# Patient Record
Sex: Female | Born: 1940
Health system: Southern US, Community
[De-identification: ages and names within clinical notes are randomized; demographics above are authoritative.]

## PROBLEM LIST (undated history)

## (undated) DIAGNOSIS — R0600 Dyspnea, unspecified: Secondary | ICD-10-CM

## (undated) DIAGNOSIS — J189 Pneumonia, unspecified organism: Secondary | ICD-10-CM

## (undated) DIAGNOSIS — F329 Major depressive disorder, single episode, unspecified: Secondary | ICD-10-CM

## (undated) DIAGNOSIS — I82409 Acute embolism and thrombosis of unspecified deep veins of unspecified lower extremity: Secondary | ICD-10-CM

## (undated) DIAGNOSIS — J449 Chronic obstructive pulmonary disease, unspecified: Secondary | ICD-10-CM

## (undated) DIAGNOSIS — F3289 Other specified depressive episodes: Secondary | ICD-10-CM

## (undated) DIAGNOSIS — J4489 Other specified chronic obstructive pulmonary disease: Secondary | ICD-10-CM

## (undated) DIAGNOSIS — D500 Iron deficiency anemia secondary to blood loss (chronic): Secondary | ICD-10-CM

## (undated) DIAGNOSIS — K219 Gastro-esophageal reflux disease without esophagitis: Secondary | ICD-10-CM

## (undated) DIAGNOSIS — F419 Anxiety disorder, unspecified: Secondary | ICD-10-CM

## (undated) DIAGNOSIS — Z972 Presence of dental prosthetic device (complete) (partial): Secondary | ICD-10-CM

## (undated) DIAGNOSIS — Z923 Personal history of irradiation: Secondary | ICD-10-CM

## (undated) HISTORY — PX: DILATION AND CURETTAGE OF UTERUS: SHX78

## (undated) HISTORY — DX: Other specified chronic obstructive pulmonary disease: J44.89

## (undated) HISTORY — PX: TUBAL LIGATION: SHX77

## (undated) HISTORY — DX: Chronic obstructive pulmonary disease, unspecified: J44.9

## (undated) HISTORY — DX: Major depressive disorder, single episode, unspecified: F32.9

## (undated) HISTORY — DX: Other specified depressive episodes: F32.89

## (undated) HISTORY — DX: Gastro-esophageal reflux disease without esophagitis: K21.9

## (undated) HISTORY — PX: ABDOMINAL HYSTERECTOMY: SHX81

## (undated) HISTORY — PX: CATARACT EXTRACTION W/ INTRAOCULAR LENS  IMPLANT, BILATERAL: SHX1307

## (undated) HISTORY — PX: NASAL SINUS SURGERY: SHX719

## (undated) HISTORY — PX: MULTIPLE TOOTH EXTRACTIONS: SHX2053

## (undated) HISTORY — PX: ABDOMINAL HYSTERECTOMY: SUR658

## (undated) HISTORY — PX: COLONOSCOPY: SHX174

## (undated) HISTORY — PX: CYSTOSCOPY: SUR368

---

## 1997-09-04 ENCOUNTER — Other Ambulatory Visit: Admission: RE | Admit: 1997-09-04 | Discharge: 1997-09-04 | Payer: Self-pay | Admitting: *Deleted

## 1998-04-13 ENCOUNTER — Ambulatory Visit (HOSPITAL_COMMUNITY): Admission: RE | Admit: 1998-04-13 | Discharge: 1998-04-13 | Payer: Self-pay | Admitting: Urology

## 1998-06-18 ENCOUNTER — Other Ambulatory Visit: Admission: RE | Admit: 1998-06-18 | Discharge: 1998-06-18 | Payer: Self-pay | Admitting: Urology

## 2001-03-12 ENCOUNTER — Ambulatory Visit (HOSPITAL_COMMUNITY): Admission: RE | Admit: 2001-03-12 | Discharge: 2001-03-12 | Payer: Self-pay | Admitting: *Deleted

## 2004-02-29 ENCOUNTER — Inpatient Hospital Stay (HOSPITAL_COMMUNITY): Admission: EM | Admit: 2004-02-29 | Discharge: 2004-03-03 | Payer: Self-pay | Admitting: Emergency Medicine

## 2004-03-01 ENCOUNTER — Encounter (INDEPENDENT_AMBULATORY_CARE_PROVIDER_SITE_OTHER): Payer: Self-pay | Admitting: Cardiology

## 2004-03-08 ENCOUNTER — Ambulatory Visit: Admission: RE | Admit: 2004-03-08 | Discharge: 2004-03-08 | Payer: Self-pay

## 2004-07-05 ENCOUNTER — Inpatient Hospital Stay (HOSPITAL_COMMUNITY): Admission: EM | Admit: 2004-07-05 | Discharge: 2004-07-09 | Payer: Self-pay | Admitting: Emergency Medicine

## 2004-07-05 ENCOUNTER — Ambulatory Visit: Payer: Self-pay | Admitting: Internal Medicine

## 2004-07-15 ENCOUNTER — Ambulatory Visit: Payer: Self-pay | Admitting: Internal Medicine

## 2004-08-29 ENCOUNTER — Ambulatory Visit: Payer: Self-pay | Admitting: Internal Medicine

## 2005-02-28 ENCOUNTER — Ambulatory Visit: Payer: Self-pay | Admitting: Internal Medicine

## 2005-03-28 ENCOUNTER — Ambulatory Visit: Payer: Self-pay | Admitting: Pulmonary Disease

## 2005-04-06 ENCOUNTER — Ambulatory Visit: Payer: Self-pay | Admitting: Internal Medicine

## 2005-04-27 ENCOUNTER — Emergency Department (HOSPITAL_COMMUNITY): Admission: EM | Admit: 2005-04-27 | Discharge: 2005-04-28 | Payer: Self-pay | Admitting: Emergency Medicine

## 2005-04-28 ENCOUNTER — Ambulatory Visit: Payer: Self-pay | Admitting: Internal Medicine

## 2005-05-14 ENCOUNTER — Emergency Department (HOSPITAL_COMMUNITY): Admission: EM | Admit: 2005-05-14 | Discharge: 2005-05-15 | Payer: Self-pay | Admitting: Emergency Medicine

## 2005-05-19 ENCOUNTER — Ambulatory Visit: Payer: Self-pay | Admitting: Internal Medicine

## 2005-06-19 ENCOUNTER — Ambulatory Visit: Payer: Self-pay | Admitting: Internal Medicine

## 2005-07-19 ENCOUNTER — Ambulatory Visit: Payer: Self-pay | Admitting: Internal Medicine

## 2005-10-05 ENCOUNTER — Ambulatory Visit: Payer: Self-pay | Admitting: Internal Medicine

## 2006-04-05 ENCOUNTER — Ambulatory Visit: Payer: Self-pay | Admitting: Internal Medicine

## 2006-08-23 ENCOUNTER — Ambulatory Visit: Payer: Self-pay | Admitting: Internal Medicine

## 2006-09-27 ENCOUNTER — Ambulatory Visit: Payer: Self-pay | Admitting: Internal Medicine

## 2007-02-21 ENCOUNTER — Ambulatory Visit: Payer: Self-pay | Admitting: Internal Medicine

## 2007-03-26 ENCOUNTER — Ambulatory Visit: Payer: Self-pay | Admitting: Internal Medicine

## 2007-07-16 ENCOUNTER — Ambulatory Visit (HOSPITAL_COMMUNITY): Admission: RE | Admit: 2007-07-16 | Discharge: 2007-07-16 | Payer: Self-pay | Admitting: *Deleted

## 2007-07-16 ENCOUNTER — Emergency Department (HOSPITAL_COMMUNITY): Admission: EM | Admit: 2007-07-16 | Discharge: 2007-07-16 | Payer: Self-pay | Admitting: Emergency Medicine

## 2007-07-16 DIAGNOSIS — J45909 Unspecified asthma, uncomplicated: Secondary | ICD-10-CM | POA: Insufficient documentation

## 2007-07-16 DIAGNOSIS — K219 Gastro-esophageal reflux disease without esophagitis: Secondary | ICD-10-CM

## 2007-07-16 DIAGNOSIS — J309 Allergic rhinitis, unspecified: Secondary | ICD-10-CM | POA: Insufficient documentation

## 2007-07-16 DIAGNOSIS — J40 Bronchitis, not specified as acute or chronic: Secondary | ICD-10-CM

## 2007-07-17 ENCOUNTER — Ambulatory Visit: Payer: Self-pay | Admitting: Internal Medicine

## 2007-07-17 DIAGNOSIS — J449 Chronic obstructive pulmonary disease, unspecified: Secondary | ICD-10-CM | POA: Insufficient documentation

## 2007-07-18 ENCOUNTER — Inpatient Hospital Stay (HOSPITAL_COMMUNITY): Admission: EM | Admit: 2007-07-18 | Discharge: 2007-07-23 | Payer: Self-pay | Admitting: Emergency Medicine

## 2007-07-18 ENCOUNTER — Ambulatory Visit: Payer: Self-pay | Admitting: Internal Medicine

## 2007-08-08 ENCOUNTER — Ambulatory Visit: Payer: Self-pay | Admitting: Internal Medicine

## 2007-12-16 ENCOUNTER — Ambulatory Visit: Payer: Self-pay | Admitting: Internal Medicine

## 2007-12-16 DIAGNOSIS — M5430 Sciatica, unspecified side: Secondary | ICD-10-CM | POA: Insufficient documentation

## 2008-01-23 ENCOUNTER — Telehealth (INDEPENDENT_AMBULATORY_CARE_PROVIDER_SITE_OTHER): Payer: Self-pay | Admitting: *Deleted

## 2008-02-25 ENCOUNTER — Telehealth (INDEPENDENT_AMBULATORY_CARE_PROVIDER_SITE_OTHER): Payer: Self-pay | Admitting: *Deleted

## 2008-03-17 ENCOUNTER — Ambulatory Visit: Payer: Self-pay | Admitting: Internal Medicine

## 2008-07-09 ENCOUNTER — Telehealth: Payer: Self-pay | Admitting: Internal Medicine

## 2008-09-15 ENCOUNTER — Ambulatory Visit: Payer: Self-pay | Admitting: Internal Medicine

## 2009-03-15 ENCOUNTER — Ambulatory Visit: Payer: Self-pay | Admitting: Internal Medicine

## 2009-09-15 ENCOUNTER — Ambulatory Visit: Payer: Self-pay | Admitting: Internal Medicine

## 2010-06-30 NOTE — Assessment & Plan Note (Signed)
Summary: rov 6 months////kp   Primary Provider/Referring Provider:  Renne Crigler  CC:  6 month follow up visit.  History of Present Illness: 03/17/08- She feels fine now.  We discussed the flu season.  She got chest congestion just before a trip to [facility], and took prednisone.  She felt fine while she was there. Denies cough, phlegm, chest pain or palpitation, fever or chills.  09/15/08- COPD, asthma, allergic rhinitis pollen makes her eyes waterand nose run, but not bad. got through winter well without Flu or serious problems.  Smoking "not much". No routine cough or phlegm, chest pain or swollen nodes. Feels better than she has in a long time.-she credits dieting to lose weight.  March 15, 2009- COPD, asthma, allergic rhinitis C/O 2 weeks "sinus" head congestion, watery nose. Took some left over prednisone with no effect.  Denies fever, sore throat, chest congestion or chest pain. CXR in April - NAD. Routine physical last month "fine". Had flu vax. She credits feeling better to having lost weight.  September 15, 2009- COPD, asthma, allergic rhinitis Spring pollen causing some watery eyes and nose, but chest ok and she got through the winter withur major problem. Having some back pain- asks refill Tylenol#3. We discussed effect of back pain on breathing and also the relative price of Advair products. She uses Xopenex occasionally and Spiriva not at all.  Current Medications (verified): 1)  Simvastatin 20 Mg Tabs (Simvastatin) .... Take 1 By Mouth Once Daily 2)  Xopenex 0.63 Mg/6ml  Nebu (Levalbuterol Hcl) .... 4 Times Daily Prn 3)  Xopenex Hfa Rescue Inhaler .... Prn 4)  Trazodone Hcl 100 Mg  Tabs (Trazodone Hcl) .... Take 1 Tab By Mouth At Bedtime 5)  Spiriva Handihaler 18 Mcg  Caps (Tiotropium Bromide Monohydrate) .... Inhale Contents of 1 Capsule Once A Day 6)  Advair Diskus 500-50 Mcg/dose  Misc (Fluticasone-Salmeterol) .Marland Kitchen.. 1 Puff Two Times A Day, Rinse Mouth Well 7)   Acetaminophen-Codeine #3 300-30 Mg  Tabs (Acetaminophen-Codeine) .Marland Kitchen.. 1-2 Four Times A Day As Needed Pain 8)  Wellbutrin Sr 150 Mg Xr12h-Tab (Bupropion Hcl)  Allergies (verified): 1)  ! Penicillin 2)  ! Sulfa 3)  ! * Pneumococcal Vaccine 4)  ! Biaxin  Past History:  Past Medical History: Last updated: Aug 05, 2007 Allergic Rhinitis Asthma C O P D G E R D cystitis depression  Past Surgical History: Last updated: 08/05/2007 Bronchoscopy- 1988 Hysterectomy Sinus surgery cystoscopy Trigger thumb repair  Family History: Last updated: 08-05-2007 Mother died -MI, diabetic Father died - emphysema Brother -CABG in his late 49's  Social History: Last updated: 03/17/2008 Patient states former smoker.  divorced disabled by copd- had worked at [employer]  Risk Factors: Smoking Status: quit (August 05, 2007)  Review of Systems      See HPI  The patient denies anorexia, fever, weight loss, weight gain, vision loss, decreased hearing, hoarseness, chest pain, syncope, dyspnea on exertion, peripheral edema, prolonged cough, headaches, hemoptysis, and severe indigestion/heartburn.    Vital Signs:  Patient profile:   70 year old female Height:      67 inches Weight:      152.50 pounds BMI:     23.97 O2 Sat:      94 % on Room air Pulse rate:   71 / minute BP sitting:   110 / 68  (left arm) Cuff size:   regular  Vitals Entered By: Reynaldo Minium CMA (September 15, 2009 9:05 AM)  O2 Flow:  Room air  Physical Exam  Additional Exam:  General: A/Ox3; pleasant and cooperative, NAD, note weight loss SKIN: no rash, lesions NODES: no lymphadenopathy HEENT: [place]/AT, EOM- WNL, Conjuctivae- clear, PERRLA, TM-WNL, Nose- clear, Throat- clear and wnl, Mallampati II NECK: Supple w/ fair ROM, JVD- none, normal carotid impulses w/o bruits Thyroid- CHEST: Clear to P&A,  very diminished, slow expiratory phase HEART: RRR, no m/g/r heard ABDOMEN:  ZOX:WRUE, nl pulses, no edema  NEURO: Grossly intact  to observation, squirming attributed to back pain.      Impression & Recommendations:  Problem # 1:  C O P D (ICD-496) Stable through the winter, having avoided major infection. I will let her have a script for mid strength Advair to compare. She tends to drift off Advair when feeling particularly well. We discussed maintenance vs rescue meds.She is very cost sensitive.  Problem # 2:  ALLERGIC RHINITIS (ICD-477.9)  We encouraged use of otc antihistamine if needed.  Medications Added to Medication List This Visit: 1)  Simvastatin 20 Mg Tabs (Simvastatin) .... Take 1 by mouth once daily 2)  Advair Diskus 250-50 Mcg/dose Aepb (Fluticasone-salmeterol) .Marland Kitchen.. 1 puff and rinse mouth, twice daily  Other Orders: Est. Patient Level III (45409)  Patient Instructions: 1)  Please schedule a follow-up appointment in 1 year. 2)  Take the script for Advair 250/50 to your drug store and price-compare with the 500/50 strength. I would rather have you on the weaker one if it works as well. 3)  For allergy, try otc antihistamine: Claritin/ loratadine or zyrtec/ cetirizine. 4)  script for pain med for back pain. Prescriptions: ACETAMINOPHEN-CODEINE #3 300-30 MG  TABS (ACETAMINOPHEN-CODEINE) 1-2 four times a day as needed pain  #50 x 2   Entered and Authorized by:   Waymon Budge MD   Signed by:   Waymon Budge MD on 09/15/2009   Method used:   Print then Give to Patient   RxID:   [id number] ADVAIR DISKUS 250-50 MCG/DOSE AEPB (FLUTICASONE-SALMETEROL) 1 puff and rinse mouth, twice daily  #1 x prn   Entered and Authorized by:   Waymon Budge MD   Signed by:   Waymon Budge MD on 09/15/2009   Method used:   Print then Give to Patient   RxID:   [id number]

## 2010-09-13 ENCOUNTER — Encounter: Payer: Self-pay | Admitting: Internal Medicine

## 2010-09-15 ENCOUNTER — Ambulatory Visit (INDEPENDENT_AMBULATORY_CARE_PROVIDER_SITE_OTHER): Payer: Medicare Other | Admitting: Internal Medicine

## 2010-09-15 ENCOUNTER — Encounter: Payer: Self-pay | Admitting: Internal Medicine

## 2010-09-15 VITALS — BP 116/70 | HR 72 | Ht 67.0 in | Wt 159.8 lb

## 2010-09-15 DIAGNOSIS — J449 Chronic obstructive pulmonary disease, unspecified: Secondary | ICD-10-CM

## 2010-09-15 DIAGNOSIS — M4850XA Collapsed vertebra, not elsewhere classified, site unspecified, initial encounter for fracture: Secondary | ICD-10-CM

## 2010-09-15 DIAGNOSIS — J309 Allergic rhinitis, unspecified: Secondary | ICD-10-CM

## 2010-09-15 MED ORDER — ACETAMINOPHEN-CODEINE #3 300-30 MG PO TABS: 2.0000 | ORAL_TABLET | Freq: Four times a day (QID) | ORAL | Status: AC | PRN

## 2010-09-15 NOTE — Patient Instructions (Signed)
Orders-   Neb neo nasal                 Depo 80   Refill script printed for tylenol III   Call as needed for your breathing meds.

## 2010-09-15 NOTE — Assessment & Plan Note (Signed)
This is mostly upper airway so far, but she expects it to progress if unattended.  We discussed options and will give neb and depo. She will continue sudafed and neti pot

## 2010-09-15 NOTE — Assessment & Plan Note (Addendum)
We reviewed meds and discused refill needs. We will refill tylenol III used for occasional cough control and for back pain.

## 2010-09-15 NOTE — Progress Notes (Signed)
  Subjective:    Patient ID: [name], female    DOB: [birth year], 70 y.o.   MRN: [id number]  HPI 70 yo former smoker followed for COPD and allergic rhinitis, complicated by GERD and chronic back pain.. I last saw her April 68, 2011 with pollen rhinitis then. Notes reviewed. She says she has done well all winter. Now reports 4 days of head congestion, ears and nose stopped up, watery eyes, postnasal drip. Minor wheeze, but chest not bad yet. She blames pollen. Using sudafed and Neti pot without relief.    Review of Systems See HPI Constitutional:   No weight loss, night sweats,  Fevers, chills, fatigue, lassitude. HEENT:   No headaches,  Difficulty swallowing,  Tooth/dental problems,  Sore throat,                CV:  No chest pain,  Orthopnea, PND, swelling in lower extremities, anasarca, dizziness, palpitations  GI  No heartburn, indigestion, abdominal pain, nausea, vomiting, diarrhea, change in bowel habits, loss of appetite  Resp:Some shortness of breath with exertion   No excess mucus, no productive cough, little non-productive cough,  No coughing up of blood.  No change in color of mucus.  Not wheezing.  Skin: no rash or lesions.  GU: no dysuria, change in color of urine, no urgency or frequency.  No flank pain.  MS:  No joint pain or swelling.  No decreased range of motion.  No back pain.  Psych:  No change in mood or affect. No depression or anxiety.  No memory loss.      Objective:   Physical Exam General- Alert, Oriented, Affect-appropriate, Distress- none acute  Skin- rash-none, lesions- none, excoriation- none  Lymphadenopathy- none  Head- atraumatic  Eyes- Gross vision intact, PERRLA, conjunctivae clear   secretions  Ears- Hearing normal  canals, Tm L , R ,  Nose- Clear, No- Septal dev, mucus, polyps, erosion, perforation   Throat- Mallampati II , mucosa clear , drainage- none, tonsils- atrophic  Neck- flexible , trachea midline, no stridor , thyroid nl,  carotid no bruit  Chest - symmetrical excursion , unlabored     Heart/CV- RRR , no murmur , no gallop  , no rub, nl s1 s2                     - JVD- none , edema- none, stasis changes- none, varices- none     Lung- diminished with expiratory wheeze, unlabored;      cough- none , dullness-none, rub- none     Chest wall-   Abd- tender-no, distended-no, bowel sounds-present, HSM- no  Br/ Gen/ Rectal- Not done, not indicated  Extrem- cyanosis- none, clubbing, none, atrophy- none, strength- nl  Neuro- grossly intact to observation         Assessment & Plan:

## 2010-09-18 ENCOUNTER — Encounter: Payer: Self-pay | Admitting: Internal Medicine

## 2010-09-23 ENCOUNTER — Telehealth: Payer: Self-pay | Admitting: Internal Medicine

## 2010-09-23 MED ORDER — LEVOFLOXACIN 500 MG PO TABS: 500.0000 mg | ORAL_TABLET | Freq: Every day | ORAL | Status: AC

## 2010-09-23 NOTE — Telephone Encounter (Signed)
Pt returning call.[name] ° °

## 2010-09-23 NOTE — Telephone Encounter (Signed)
Spoke w/ pt and advised her rx was sent to pharmacy. Pt verbalized understanding and nothing further was needed

## 2010-09-23 NOTE — Telephone Encounter (Signed)
Per CDY-okay to give Levaquin 500mg  #7 take 1 by mouth daily with 1 refill.Vivianne Spence

## 2010-09-23 NOTE — Telephone Encounter (Signed)
LMOMTCB to inform pt levaquin sent to [facility] with 1 refill

## 2010-09-23 NOTE — Telephone Encounter (Signed)
Called, spoke with pt.  She c/o HA, sinus pressure, PND, sneezing, head congestion with bright yellow mucus, and night sweats.  She was just seen on 09/15/10.  Requesting abx - states levaquin usually works well.  Allergies verified.  [facility].  Dr. Maple Hudson, pls advise.  Thanks!  Allergies  Allergen Reactions  . Clarithromycin   . Penicillins   . Pneumococcal Vaccines   . Sulfonamide Derivatives

## 2010-10-11 NOTE — Assessment & Plan Note (Signed)
[facility]                             PULMONARY OFFICE NOTE   NAME:[name], [name]                        MRN:          [id number]  DATE:03/26/2007                            DOB:          03-01-1941    PROBLEMS:  1. Asthma/chronic obstructive pulmonary disease.  2. Allergic rhinitis.  3. Esophageal reflux.   HISTORY:  He had a bronchitis episode earlier this summer, which has  resolved completely.  She feels pretty well now.  She is interested in  trying Chantix again, but was concerned about the risk and safety  information she had heard and we reviewed this and answered her  questions.  She realizes the smoking issue is important enough to her  that I think she is going to try it again.  She had a pneumococcal  vaccine in 2006, and got systemically ill immediately after, so has no  intention of repeating that.  She does want a flu shot.  She complains  of abdominal obesity, crowding her breathing.  We discussed steroid side  effects.  She had a prednisone taper helping clear her bronchitis in  September.   MEDICATIONS:  1. Zoloft 100 mg.  2. Trazodone.  3. Oxygen 2 liters at night and p.r.n.  4. Advair 500/50 mg.  5. Theophylline 200 mg twice daily, mostly being used as once daily      now.  6. Crestor 10 mg.  7. Home nebulizer Xopenex 0.64 mg four times daily p.r.n.  8. Xopenex HFA rescue inhaler p.r.n.   INTOLERANCES/ALLERGIES:  PENICILLIN, SULFA, PNEUMOCOCCAL VACCINE AND  BIAXIN which causes GI upset.   OBJECTIVE:  VITAL SIGNS:  Weight 171 pounds, blood pressure 118/60,  pulse 68, room air saturation 93%.  LUNGS:  Diminished breath sounds, quiet.  ABDOMEN:  Obesity.  HEART:  Sounds regular without murmur.  NECK:  No adenopathy.   IMPRESSION:  Chronic obstructive pulmonary disease.   PLAN:  Weight loss probably would be helpful.  We discussed steroid side  effects as above.  If she can stay stable, we are going to look at  reducing her Advair strength next visit.  Flu vaccine discussed and  given.  Schedule a return in six months or earlier p.r.n.     Clinton D. Maple Hudson, MD, Tonny Bollman, FACP  Electronically Signed    CDY/MedQ  DD: 03/26/2007  DT: 03/27/2007  Job #: [id number]   cc:   Soyla Murphy. Renne Crigler, M.D.

## 2010-10-11 NOTE — Assessment & Plan Note (Signed)
[facility]                             PULMONARY OFFICE NOTE   NAME:[name] [name] YZAGUIRRE                        MRN:          [id number]  DATE:02/21/2007                            DOB:          Feb 23, 1941    PROBLEM:  1. Asthma/chronic obstructive pulmonary disease.  2. Allergic rhinitis.  3. Esophageal reflux.   HISTORY:  Four or five days of malaise with sore throat, moving into  chest congestion, diffuse muscle aches, no definite fever, clear to  yellow sputum.  Spiriva was too expensive.  She is using a netipot.  Still smoking and asks to try Chantix which we have discussed before.   MEDICATIONS:  Zoloft 100 mg, Trazodone, oxygen 2 liters at night and  p.r.n., mostly being used p.r.n., Advair 500/50, theophylline 200 mg  b.i.d., Crestor 10 mg, home nebulizer, Xopenex 0.63, Xopenex HFA Rescue  Inhaler, occasional Tussionex.   DRUG INTOLERANCE:  PENICILLIN, SULFA, BIAXIN (CAUSES GI UPSET),  PNEUMOCOCCAL VACCINE (MAKES HER FEEL BAD).   OBJECTIVE:  Weight 170 pounds.  Blood pressure 144/80, pulse 75, room  air saturation 88% at rest on room air.  She did not bring oxygen and  this was discussed.  Inspiratory and expiratory mild wheezing, no really labored.  HEENT:  Moderate turbinate edema, thick white mucus. Pharynx is a little  reddened, I do not find adenopathy.  HEART:  Regular heart sounds without murmur.  There is no edema.   IMPRESSION:  1. Exacerbation of asthma with COPD.  2. Viral syndrome.   PLAN:  Depo-Medrol 80 mg IM, Prednisone 8-day taper from 40 mg, steroid  talk, Doxycycline x7 days to hold, smoking cessation, Chantix starter  kit.  Keep scheduled return appointment, early p.r.n.     [name] D. Maple Hudson, MD, [name] [name], FACP  Electronically Signed    CDY/MedQ  DD: 02/23/2007  DT: 02/23/2007  Job #: [id number]   cc:   [name] [name]. [name] [name], M.D.

## 2010-10-11 NOTE — H&P (Signed)
[name], [name]                 ACCOUNT NO.:  [id number]   MEDICAL RECORD NO.:  [id number]          PATIENT TYPE:  INP   LOCATION:  6705                         FACILITY:  [facility]   PHYSICIAN:  Nelda Bucks, MD DATE OF BIRTH:  [birth year]   DATE OF ADMISSION:  07/17/2007  DATE OF DISCHARGE:                              HISTORY & PHYSICAL   HISTORY OF PRESENT ILLNESS:  This is a 70 year old white female, known  smoker with known COPD with p.r.n. oxygen never requiring intubation for  COPD in the past who presents with three day history of increasing  cough, pleuritic chest pain, diffuse in nature and shortness of breath.  The patient reported increasing shortness of breath at rest as well as  with exertion and presents to the emergency room on February 17, treated  with nebulized therapy and diagnosis was COPD exacerbation at that time  and improved clinically.  She also saw her primary pulmonologist, Dr.  Fannie Knee, in the office this morning, and Dr. Maple Hudson had concerns  about possibility of her requiring hospitalization, but prednisone  therapy was prescribed to see if this would improve her shortness of  breath.  She now presents to the emergency room with increasing  shortness of breath, worsening coughing, but denies fevers.  She did say  she had been productive of a sputum that has been clear in nature, but  the amount of sputum has much increased, but this color has not changed.  She denies nausea, vomiting, diarrhea or abdominal pain.  She supposedly  has been about the require an EGD from her GI specialist yesterday, but  that EGD was held secondary to her respiratory status.  It appeared she  was supposed to be having this EGD, according to the patient, because of  bloating, to get ruled out for stomach ulcers.  In the emergency room,  she is found to be diffusely wheezing and requires hospitalization.   ALLERGIES:  PENICILLIN AND SULFA, SUPPOSEDLY CAUSE ANGIOEDEMA.   ALLERGIC  TO ROFECOXIB AND CELEBREX WHICH CAUSE SIDE EFFECT OF CHEST PAIN.  SIDE  EFFECT OF WEIGHT GAIN TO CELEXA AND MIRTAZAPINE.   PAST MEDICAL HISTORY:  1. Asthma/COPD.  2. Afebrile.  3. GERD.  4. Pneumovax in 2006.  5. Anxiety disorder.  6. Depression.  7. Negative cardiac catheterization by Dr. Katrinka Blazing, according to the      patient, in the past.   MEDICATIONS:  1. Zoloft.  2. Spiriva.  3. Theophylline.  She has discontinued.  She denies taking the last      couple of weeks.  4. Crestor.  5. Xopenex, just started by Dr. Fannie Knee.  6. Hydrocodone/acetaminophen.  7. Promethazine.  8. Advair Discus.   SOCIAL HISTORY:  She is a known smoker for years.  Denied alcohol,  denied drugs.  She has three children.  She is divorced for 25 years.  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   FAMILY HISTORY:  Positive for COPD and positive for thyroid disease.   REVIEW OF SYSTEMS:  As above mentioned  in the GI section of her HPI.   PHYSICAL EXAMINATION:  VITAL SIGNS:  Temperature 97, 86% saturation on  room air upon arrival, now on 3 liters sating 98%, respiratory rate 24,  heart rate 112, blood pressure 141/82.  GENERAL:  The patient is in mild respiratory distress, leaning forward,  coughing.  HEENT:  No jugular venous distention.  No masses felt.  No  sternocleidomastoid lymphadenopathy section.  No thrush.  No stridor.  She did have some radiated wheezing up to the airway.  LUNGS:  Expiratory wheezing, moderate in nature.  Diffuse location  negative for egophony.  Negative for rhonchi.  Negative for crackles.  HEART:  S1, S2 regular rate and mildly tachycardic at 102 to 110.  No  murmurs, rubs or gallops but difficult to auscultate secondary to  wheezing.  ABDOMEN:  Belly soft, bowel sounds present.  There is no masses felt in  the epigastrium.  There is no rebound and no guarding.  EXTREMITIES:  No edema.  No cyanosis or clubbing.  CNS:  Alert  and oriented x3.  Nonfocal.   LABORATORY DATA:  Labs in the ER shows a portable chest x-ray with  hyperinflation with some flattening of the diaphragms, mild chronic  interstitial changes.  No significant changes noted from yesterday's  portable chest x-ray.  There is no other laboratory data that I have  here in the emergency room.   ASSESSMENT/PLAN:  1. COPD exacerbation.  2. Rule out acute bronchitis.  3. GERD with new symptoms of bloating, being worked up by her GI      specialists.  4. Anxiety.  5. Sciatica pain in the left side which is old and she has had in the      past.   PLAN:  The patient will be admitted to the hospital.  IV Solu-Medrol 80  mg q.8 h will be administered.  Frequent nebulization with albuterol and  Atrovent.  Follow her heart rate response.  She may require Xopenex.  I  will make a consideration for antibiotic in this patient given the fact  that she has had recurrent worsening symptoms over three days regardless  of administration of steroids, and she has an increase in her sputum  production.  In general, antibiotic therapy potentially has a benefit  regardless of infection by reducing inflammatory mediators, and we will  consider a five day course of Avelox.  Will check sputum is she is  productive.  She also will use PPI to reduce GERD symptoms.  She will  get a chemistry, CBC differential, EKG at this time.  Add Robitussin  with codeine to control her cough.  She will be continued on her  baseline home medications including trazodone and Zoloft and her Advair  Discus twice a day.  Because coughing causing insomnia, we will try to  control her cough.  She is a smoker but denies smoking more than 10  cigarettes in a day.  Therefore, we will hold off on nicotine patch at  this time and  will provide Percocet for her sciatica pain.  Will provide oxygen  therapy to saturation 90-92%.  She will be admitted to Dr. Harriette Ohara  service.  I also will  provide insulin therapy to control probable  hyperglycemia related to steroids which she has had in the past  according to notes.      Nelda Bucks, MD  Electronically Signed     DJF/MEDQ  D:  07/18/2007  T:  07/18/2007  Job:  [id number]   cc:   Joni Fears D. Maple Hudson, MD, FCCP, FACP

## 2010-10-14 NOTE — Assessment & Plan Note (Signed)
[facility]                               PULMONARY OFFICE NOTE   NAME:[name], [name]                        MRN:          [id number]  DATE:04/05/2006                            DOB:          [birth year]    PROBLEM:  1. Asthma/chronic obstructive pulmonary disease.  2. Allergic rhinitis.  3. Esophageal reflux.   HISTORY:  She did well on recent travel to [place] and feels okay today.  Continuing Advair at 500/50.  She has put on a little weight and we  discussed the dose of her steroid inhaler, choosing to wait through the  winter because of the severity of exacerbation she has had in the past.  We  discussed pneumococcal vaccine which she had in 2006 for the first time.  She blames it for making her acutely ill at that time but wants flu shot.  She is not currently coughing or bringing up any phlegm but she expects to  wheeze as a routine most days.   MEDICATIONS:  1. Zoloft 100 mg.  2. Trazodone.  3. Oxygen at 2 liters used at night and p.r.n.  4. She dropped off Spiriva.  5. Advair 500/50 once b.i.d.  6. Theophylline 200 mg b.i.d.  7. Crestor 10 mg.  8. Home nebulizer is Xopenex 0.63.  Rescue albuterol is used occasionally.   ALLERGIES:  Drug intolerance to PENICILLIN, SULFA, BIAXIN with GI upset,  PNEUMOCOCCAL VACCINE.   OBJECTIVE:  Weight 162 pounds, blood pressure 118/80, pulse regular 74, room  air saturation 91%.  There is bilateral expiratory wheeze only with forced  expiration.  With quiet breathing she sounds fairly clear, but expiratory  phase is prolonged.  There is no neck vein distention, adenopathy, cyanosis  or peripheral  edema.  Heart sounds are regular without murmur.   IMPRESSION:  Stable COPD with a chronic asthmatic/bronchitis component.  Pulmonary function testing 2 years ago had shown an FEV1 60% of predicted  after a significant response to bronchodilator with a diffusion capacity of  74%.   PLAN:  Flu vaccine,  chest x-ray.  Schedule return 6 months and consider then  reducing Advair to 250/50.  I am marking her chart that I would like to get  a PFT on followup.     Clinton D. Maple Hudson, MD, Tonny Bollman, FACP  Electronically Signed    CDY/MedQ  DD: 04/08/2006  DT: 04/08/2006  Job #: [id number]   cc:   Soyla Murphy. Renne Crigler, M.D.  Lyn Records, M.D.

## 2010-10-14 NOTE — Discharge Summary (Signed)
[name], [name]                 ACCOUNT NO.:  [id number]   MEDICAL RECORD NO.:  [id number]          PATIENT TYPE:  INP   LOCATION:  6706                         FACILITY:  [facility]   PHYSICIAN:  Clinton D. Maple Hudson, MD, FCCP, FACPDATE OF BIRTH:  1940/11/09   DATE OF ADMISSION:  07/17/2007  DATE OF DISCHARGE:  07/23/2007                               DISCHARGE SUMMARY   DISCHARGE DIAGNOSES:  1. Acute exacerbation of chronic obstructive pulmonary disease.  2. Anxiety.  3. Gastroesophageal reflux disorder.  4. Sciatica.   BRIEF HISTORY:  This is a 70 year old former smoker with advanced COPD  using p.r.n. oxygen at home who presented with increased cough and  dyspnea to the emergency room on February 17.  She saw me then in the  office the next morning for continuation of accelerated outpatient  therapy including steroids and bronchodilators.  She returned to the  emergency room on February 18 still short of breath and coughing without  fever and with clear sputum.  At that point she was admitted for  stabilization of COPD exacerbation.  Examination on admission showed an  initial room air oxygen saturation of 86% improving to 98% on 3 liters  with heart rate 112 and expiratory wheezes.  Chest x-ray showed  hyperinflation and chronic interstitial changes.  She had additional  history of esophageal reflux with abdominal bloating, anxiety, and  chronic back pain.  She was admitted to medical floor.   HOSPITAL COURSE:  She is treated with supplemental oxygen, nebulized  bronchodilators, intravenous steroids, and antibiotics.  She slowly  improved.  Xanax reduced anxiety which was partly related to her  medications.  She was substantially improved by the day of discharge  without wheeze and feeling more comfortable.  Blood pressure elevation  was noted and attributed to medications for outpatient follow up.  She  was discharged with condition improved.   LABORATORY:  EKG showed  nonspecific ST changes.  Chest x-ray showed  hyperaeration and bronchitis changes likely chronic with no masses or  pneumonia.  Admission WBC 14,000 fell to 12,500 with hemoglobin of 15.2.  Coagulations were normal.  Glucoses were elevated on admission,  recognizing she had already begun steroids as an outpatient.  This fell  during hospitalization.  Liver enzymes were normal.   DISCHARGE PLANS:  She was discharged home she will with her p.r.n.  oxygen, regular diet, progress activity as tolerated.   MEDICATIONS:  Trazodone 100 mg at bedtime, Advair 500/50 one puff rinse  b.i.d., Crestor 10 mg daily, nebulized Xopenex 0.63 q.i.d. p.r.n.  Spiriva once daily, prednisone tapering with 20 mg tabs from 40 mg daily  to stop after 8 days.  Office followup in 3 weeks.     Clinton D. Maple Hudson, MD, Tonny Bollman, FACP  Electronically Signed    CDY/MEDQ  D:  09/04/2007  T:  09/04/2007  Job:  [id number]

## 2010-10-14 NOTE — H&P (Signed)
NAMEFRANCIS, [name]                 ACCOUNT NO.:  [id number]   MEDICAL RECORD NO.:  [id number]          PATIENT TYPE:  INP   LOCATION:  6712                         FACILITY:  [facility]   PHYSICIAN:  Gertha Calkin, M.D.DATE OF BIRTH:  10-11-1940   DATE OF ADMISSION:  07/04/2004  DATE OF DISCHARGE:                                HISTORY & PHYSICAL   PRIMARY CARE PHYSICIAN:  Soyla Murphy. Renne Crigler, M.D.   HISTORY OF THE PRESENT ILLNESS:  This is a 70 year old white female with  COPD and anxiety/depression who presents with a 10-14-day history of  difficulty breathing, waxing and waning, and subjective fevers, productive  sputum with streaks of bleed initially, but no clear.  No sick contacts.  No  recent tobacco abuse.   PAST MEDICAL HISTORY:  The past medical history is significant for:  1.  COPD.  2.  Anxiety and depression.   MEDICATIONS:  1.  Advair 250/50 Diskus 1 puff p.o. b.i.d.  2.  Trazodone 150 mg p.o. q.h.s.  3.  Zoloft 100 mg p.o. daily.   ALLERGIES:  PENICILLIN and SULFA causes her to have angioedema.   FAMILY HISTORY:  No significant history of coronary artery disease, strokes  or cancers   REVIEW OF SYSTEMS:  No headaches, blurred vision, problems with her  appetite, weight loss or weight gain, chest pain, or chest tightness.  No  problems with constipation or diarrhea.  No hematemesis, hemoptysis,  hematochezia or melanotic stools. She has no urinary symptoms. The rest of  the review of systems is negative.   PHYSICAL EXAMINATION:  VITAL SIGNS:  Temperature is 100.2, blood pressure  on admission to the ED was 161/99 and spontaneously went down to 118/68  after a breathing treatment, pulse is 97, respirations 22, and sat 93% on 2  liters and 96% on 4 liters.  GENERAL APPEARANCE:  In general she appears in no acute distress, lying in  bed.  HEENT:  The head, eyes, ears, nose and throat are unremarkable.  HEART:  Cardiovascular is regular rate and rhythm.  No murmurs,  rubs or  gallops.  LUNGS:  The lungs have diffuse wheezes.  Tight air sounds, but adequate  movement.  No accessory muscle use.  She is able to hold a conversation  without tiring out.  ABDOMEN:  The abdomen is soft, nontender and nondistended.  Bowel sounds are  present.  EXTREMITIES:  The extremities are without clubbing, cyanosis or edema.  SKIN:  The skin is without rashes or lesions.  NEUROLOGIC:  Cranial nerves II-XII are intact.  No focal deficits.  Strength  is intact and symmetric in the upper and lower extremities.  VASCULAR:  Pulses are 2+ in the upper and lower extremities bilaterally.   LABORATORY DATA:  Chest x-ray was only significant for emphysematous-  appearing lungs.  No acute infiltrates.  Her white count is 13.1, hemoglobin  12.9, hematocrit 37.3, MCV 89 and platelets 405,000.  She has a left shift  with absolute neutrophils of 10.3.  Sodium of 141, potassium 3.2, chloride  of 106, bicarb of 28, glucose of 101,  BUN of 16, creatinine of 1.1, and  calcium of 8.9.  Total protein 6.4, albumin 3.3, AST of 18, ALT of 15, alk  phos of 87, and total bili of 0.6.   ASSESSMENT AND PLAN:  1.  Upper respiratory infection.  2.  Chronic obstructive pulmonary disease exacerbation.  3.  Anxiety/depression.  4.  Deep venous thrombosis and gastrointestinal prophylaxis.   Plan:  1.  We will admit and lace her on IV steroids q.8.  2.  Scheduled breathing treatments as well as rescue p.r.n. treatments      available.  3.  Start her on azithromycin for upper respiratory infection.  4.  Place her on O2 to wean in approximately 48 hours as her lung functions      improve.  5.  We will also check sputum cultures, repeat her potassium and place her      on Pepcid and PAS hose for GI/DVT prophylaxes.  6.  Otherwise we will continue her home medications and follow up in the      morning.      JD/MEDQ  D:  07/05/2004  T:  07/05/2004  Job:  [id number]   cc:   Soyla Murphy. Renne Crigler, M.D.   [address]  [place]  [place] [address]  Fax: [phone]

## 2010-10-14 NOTE — Consult Note (Signed)
[name], [name]                 ACCOUNT NO.:  [id number]   MEDICAL RECORD NO.:  [id number]          PATIENT TYPE:  INP   LOCATION:  6712                         FACILITY:  [facility]   PHYSICIAN:  Clinton D. Maple Hudson, M.D. DATE OF BIRTH:  19-Oct-1940   DATE OF CONSULTATION:  07/08/2004  DATE OF DISCHARGE:                                   CONSULTATION   REASON FOR CONSULTATION:  A 70 year old white female former smoker with  persistent wheezing and dyspnea.   HISTORY OF PRESENT ILLNESS:  I was asked to see this woman with history of  COPD, anxiety, and depression who presented with one to two weeks of  increased exertional and resting dyspnea, chest congestion, and concern  about streak hemoptysis which had resolved. She was admitted with assessment  that she was having exacerbation of COPD and she has been treated with  intravenous steroids, nebulized bronchodilators, Zithromax, and anxiolytics.  Hyperglycemia was noted on steroids and covered by sliding scale. She is  concerned that she has had only partial return to the level of pulmonary  comfort that she remembers from last summer before all of this began.   Hospital workup has included chest CT and ventilation perfusion lung scan  which has shown marked emphysema without acute cardiopulmonary disease and  without evidence of pulmonary embolus. Incidental note of breast implants.  CBC and chemistry panel are unremarkable after initial mild hypokalemia.  Glucose had risen to 139.   REVIEW OF SYMPTOMS:  Complains of abdominal wall muscle pain from hard  coughing. Describes coughing up pieces like liver and a few blood streaks.  There was a suggestion that she was describing blood clot coughed up.  Otherwise, sputum was yellow and is now white. Mild nasal congestion. Is not  having chills or fever. Denies palpitation, pleuritic or exertional chest  pain. No ankle edema.   PAST MEDICAL HISTORY:  1.  Chronic obstructive pulmonary  disease with previous hospitalization in      2005 which seems to have been for a similar episode.  2.  Prior heart catheterization by Dr. Lyn Records, which she understands      to be normal.   ALLERGIES:  PENICILLIN and SULFA with angioedema.   FAMILY HISTORY:  Father had COPD after working for a stone cutting company  and a Medical laboratory scientific officer.   SOCIAL HISTORY:  Quit smoking years ago. She had worked in a Public librarian,  now retired.   PHYSICAL EXAMINATION:  VITAL SIGNS:  Blood pressure 140/73, pulse regular  87, respirations about 22, temperature 98.8. O2 saturation on two liters  recorded at 92%.  GENERAL:  She is a well-developed, well-nourished, very talkative woman.  Alert and oriented. Sitting up on the edge of the bed wearing nasal oxygen  at two liters.  SKIN:  No rash.  ADENOPATHY:  None found at the neck, shoulder or axillae.  HEENT:  Clear speech. Nasal airway is clear.  NECK:  No JVD. No stridor.  CHEST:  Heart sounds regular without murmur or gallop. Bilateral mild  inspiratory and expiratory wheeze, mainly  end-expiration and unlabored. No  dullness, rub or rales.  ABDOMEN:  Scaphoid and nontender without palpable organomegaly.  EXTREMITIES:  No clubbing, cyanosis, or edema.   LABORATORY DATA:  CT and ventilation perfusion lung scans were negative for  pulmonary embolus or acute process which showed evidence of considerable  emphysema with some air trapping. EKG showed normal sinus rhythm with poor  anterior R-wave progression raising question of prior anterior infarct.   IMPRESSION:  1.  Baseline chronic obstructive pulmonary disease, severity unclear: She      reports having had a pulmonary function test at [facility]      within the last year or so but does not understand the results.  2.  Acute exacerbation, almost certainly a viral episode. I doubt that she      reacted systemically to the pneumococcal vaccine. Slow response to      medications can be  quite typical of some viral events but she is      probably getting back to a level that she could function at home.  3.  There is at least a suggestion that she may be hypoxic on room air for a      considerable period of time. Therefore, I will go ahead and ask for an      oxygen saturation on room air so that we can order home oxygen if      appropriate.  4.  Consider trial of Xopenex by home nebulizer favoring low dose 0.63 as      less likely to cause stimulation, only if albuterol is too stimulating.      She had Spiriva before but was afraid to try it. So, I would let her try      again with available supply.  5.  Emphasis should be on continued education and reassurance:  She may need      an anxiolytic. At her request, I have indicated that I would be willing      to follow her for her lung disease in the office at [facility]      if appropriate, after discharge. She will follow with her primary care      physician, Dr. Soyla Murphy. Pharr, for general care.      CDY/MEDQ  D:  07/08/2004  T:  07/08/2004  Job:  [id number]   cc:   Mobolaji B. Corky Downs, M.D.

## 2010-10-14 NOTE — H&P (Signed)
NAMEEVIN, CHIRCO                 ACCOUNT NO.:  [id number]   MEDICAL RECORD NO.:  [id number]          PATIENT TYPE:  EMS   LOCATION:  MAJO                         FACILITY:  [facility]   PHYSICIAN:  Isidor Holts, M.D.  DATE OF BIRTH:  1941/02/09   DATE OF ADMISSION:  02/29/2004  DATE OF DISCHARGE:                                HISTORY & PHYSICAL   PRIMARY CARE PHYSICIAN:  Soyla Murphy. Pharr, M.D.   CHIEF COMPLAINT:  Chest pain/discomfort and shortness of breath for  approximately one month.   HISTORY OF PRESENT ILLNESS:  As above, the patient states that over the past  12 months she has had gradually increasing shortness of breath with chest  pressure.  Denies lower extremity swelling.  Denies cough or fever.  Has no  history of paroxysmal nocturnal dyspnea.  Last night, i.e., February 28, 2004,  the shortness of breath became much worse.  Denies paroxysmal nocturnal  dyspnea at this time.  This morning; i.e., February 29, 2004, called her  primary medical doctor, was seen in his office at 11 a.m. and directed to  the emergency room. Denies recent travel or immobilization.   PAST MEDICAL HISTORY:  1.  Depression.  2.  Status post negative bronchoscopy July 1988 (? indication).  3.  Negative EDG September 2001.   MEDICATIONS:  1.  Trazodone 150 mg p.o. q.h.s.  2.  Was on Zoloft 200 mg p.o. daily; however, this was discontinued      approximately one month ago.   ALLERGIES:  1.  SULFA caused angioedema.  2.  PENICILLIN causes angioedema.  3.  REMERON causes weight gain.  4.  CELEXA causes weight gain.  5.  TORADOL causes tingling.  6.  CELEBREX and VIOXX caused chest tightness.   SOCIAL HISTORY:  The patient is retired February 2003.  Nonsmoker,  nondrinker.  No history of drug abuse.   FAMILY HISTORY:  Mother died of MI at age 68 years.  She also had diabetes  mellitus.  Father died of emphysema at age 80 years.  The patient had two  sisters, both now deceased, one of diabetes  mellitus and its complications,  the other of lung cancer.  She one brother who has heart problems.  She also  has three sons, all of whom are alive and well.   REVIEW OF SYSTEMS:  Systems reviewed as per HPI and Chief Complaint,  otherwise negative.   PHYSICAL EXAMINATION:  VITAL SIGNS:  Temperature 97.6, pulse 82 per minute  and regular, respirations 20, blood pressure initially 145/87 mmHg;  rechecked 123/68 mmHg.  Oxygen saturation on room air 96%.  GENERAL:  The patient looks quite comfortable, nothing of chest pain after  intravenous infusion of NTG started in the emergency department.  Not short  of breath at rest.  Communicative.  HEENT:  No frank pallor, no jaundice, no conjunctival injection.  NECK:  Supple.  JVP not seen.  No palpable lymphadenopathy, no palpable  goiter, no carotid bruits.  CHEST:  Clear to auscultation.  No wheezes or crackles.  CARDIAC:  Heart sounds 1  and 2 heard.  Normal rate, no murmurs.  ABDOMEN:  Full, soft, and nontender.  There is no palpable organomegaly, no  palpable masses.  Normal bowel sounds.  EXTREMITIES:  The patient has multiple fine varicosities; however, there is  no tenderness, tenseness, erythema to suggest DVT.  MUSCULOSKELETAL:  Full range of motion.  No __________, tenderness, or  deformity noted.  CENTRAL NERVOUS SYSTEM:  Alert and oriented x 3.  No focal neurologic  deficit on gross examination.   INVESTIGATIONS:  CBC:  WBC 6.5, hemoglobin 14.6, hematocrit 41.5, platelets  343.  Electrolytes: Sodium 139, potassium 3.8, chloride 107, CO2 26, BUN 17,  creatinine 0.8, glucose 98.  Troponin I less than 0.05.  LFTs within normal  limits.   EKG shows sinus rhythm, regular, 60 per minute.  Normal axis, no acute  ischemic changes.   IMPRESSION AND PLAN:  1.  History of increasing shortness of breath with chest discomfort over one      month, now worse.  Differential includes pulmonary embolism,      cardiomyopathy, coronary artery  disease.  Will admit patient to      telemetry unit for monitoring.  Will do spiral chest CT scan, chest x-      ray, and D-dimer.  Also need to do echocardiogram.  We will rule out MI      with cardiac enzymes x 3 and EKG x 3.  However, meanwhile, I will offer      patient an aspirin.  Will continue intravenous infusion of      nitroglycerin, subcutaneous Lovenox for DVT prophylaxis.  However, if CT      angiogram is positive, will give anticoagulation. The patient is to have      bed rest.  Have called cardiology consult who will kindly be provided by      Dr. Aleen Campi.  2.  History of depression.  It is possible that fact may be contributing to      symptoms.  Will start patient on Xanax twice a day.  Also will commence      patient on proton pump inhibitor in case gastroesophageal reflux disease      may be a factor.  Further management will depend on clinical course.       CO/MEDQ  D:  02/29/2004  T:  02/29/2004  Job:  [id number]   cc:   Soyla Murphy. Renne Crigler, M.D.  [address]  [place]  [place] [address]  Fax: [phone]

## 2010-10-14 NOTE — Assessment & Plan Note (Signed)
[facility]                             PULMONARY OFFICE NOTE   NAME:[name], [name]                        MRN:          [id number]  DATE:08/23/2006                            DOB:          [birth year]    PROBLEMS:  1. Asthma/chronic obstructive pulmonary disease.  2. Allergic rhinitis.  3. Esophageal reflux.   HISTORY:  Blames pollen for increased wheeze, cough and shortness of  breath, more chest congestion than head congestion in the past week.  Not much sneezing, no real sore throat or fever, nothing purulent.  Cough is productive of white phlegm.  She asks refill Tussionex.  Is not  smoking.  Has been on disability now for two years and is anticipating  going on Medicare.  Her theory is that abdominal obesity is crowding her  lungs and causing her to wheeze.   MEDICATIONS:  1. Zoloft 100 mg.  2. Trazodone.  3. Home oxygen 2 liters used at night.  4. Advair 500/50.  5. Theophylline 200 mg b.i.d.  6. Crestor 10 mg.  7. Nebulizer Xopenex 0.63 and Xopenex HFA inhalers are used p.r.n. at      home.   DRUG INTOLERANCES:  PENICILLIN, SULFA, BIAXIN with GI upset and  PNEUMOCOCCAL VACCINE.   OBJECTIVE:  VITAL SIGNS:  Weight 165 pounds, blood pressure 130/86,  pulse regular 78, room air saturation at rest is 91%.  HEENT:  There is turbinate edema but her throat is clear with no post  nasal drip, no adenopathy.  Congested, wheezy cough with musical wheeze,  inspiratory and expiratory.  No accessory muscle use.  No focal  dullness.  I do not find adenopathy.  CARDIOVASCULAR:  Heart sounds are regular, best heard near the xiphoid.  EXTREMITIES:  She does not have clubbing, cyanosis, or edema.   IMPRESSION:  Exacerbation of asthma with chronic obstructive pulmonary  disease.  This could be viral or allergic at this point.   PLAN:  1. We discussed her weight concerns, steroid therapy and exercise      tolerance.  2. Prednisone 8 day taper from 40  mg.  3. Continue present medications as at home.  4. Spirometry before and after bronchodilator.  5. Refilled Tussionex 5 mL q.12h. p.r.n. occasionally used for severe      cough.  6. Schedule return in three weeks, early p.r.n.     Clinton D. Maple Hudson, MD, Tonny Bollman, FACP  Electronically Signed    CDY/MedQ  DD: 08/23/2006  DT: 08/23/2006  Job #: [id number]   cc:   Soyla Murphy. Renne Crigler, M.D.  Lyn Records, M.D.

## 2010-10-14 NOTE — Assessment & Plan Note (Signed)
[facility]                             PULMONARY OFFICE NOTE   NAME:[name], [name]                        MRN:          [id number]  DATE:09/27/2006                            DOB:          July 05, 1940    PROBLEM:  1. Asthma/chronic obstructive pulmonary disease.  2. Allergic rhinitis.  3. Esophageal reflux.   HISTORY:  Pollen causing a little nasal congestion, otherwise no change  or complaints.  She admits she wheezes a little bit most of the time and  has a daily productive cough bringing up only white sputum, never  anything bloody or purulent.  She only occasionally uses her Xopenex by  nebulizer.  She started smoking again some and we discussed this.  A  prednisone burst did not make any difference in March.  She does not  remember Spiriva previously being a help, but is interested in trying  again.  Oxygen is used at night and p.r.n.   MEDICATION:  1. Zoloft 100 mg.  2. Trazodone.  3. Oxygen 2 L.  4. Advair 500/50.  5. Theophylline 200 mg b.i.d.  6. Crestor 10 mg.  7. Nebulizer with Xopenex 0.63.  8. Xopenex HFA inhaler.  9. Tussionex.   ALLERGIES:  Drug intolerant to PENICILLIN, SULFA, PNEUMOCOCCAL VACCINE  made her feel bad, BIAXIN caused GI upset.   OBJECTIVE:  Weight 168 pounds, BP 130/90, pulse regular 70.  Room air  saturation 90%.  There is mild diffuse unlabored wheezing with no rhonchi or dullness.  HEART:  Sounds regular without murmur.  Wiping her nose some, a little wet but not obstructed.  No adenopathy, no edema.   Chest x-ray:  Her film from November showed COPD with no acute findings.  Heart size was normal.   PFT  today shows severe obstructive airways disease with insignificant  response to bronchodilator.  FEV1 was 0.97 (41% of predicted).  There  was evidence of air trapping with residual volume 149% of predicted.  Diffusion is severely reduced at 31% of predicted, indicating much of  this is likely to be  emphysema.   IMPRESSION:  Asthma/chronic obstructive pulmonary disease - severe.  She  is already on a fair amount of medication not to have a clear chest exam  and she demonstrates after nebulizer for PFT.  I am really disappointed  that she was smoking again and was blunt with her about this, offering  help.   PLAN:  1. We refilled Tylenol #3 for cough suppression used p.r.n.  2. Saline lavage.  3. Retry Spiriva once daily.  4. Schedule return in 6 months, earlier p.r.n.     Clinton D. Maple Hudson, MD, Tonny Bollman, FACP  Electronically Signed    CDY/MedQ  DD: 09/27/2006  DT: 09/28/2006  Job #: [id number]   cc:   Soyla Murphy. Renne Crigler, M.D.  Lyn Records, M.D.

## 2010-10-14 NOTE — Cardiovascular Report (Signed)
[name], [name]                 ACCOUNT NO.:  [id number]   MEDICAL RECORD NO.:  [id number]          PATIENT TYPE:  INP   LOCATION:  4711                         FACILITY:  [facility]   PHYSICIAN:  Lyn Records III, M.D.DATE OF BIRTH:  June 22, 1940   DATE OF PROCEDURE:  03/02/2004  DATE OF DISCHARGE:                              CARDIAC CATHETERIZATION   PROCEDURE:  1.  Left heart catheterization.  2.  Selective coronary angiogram.  3.  Left ventriculography.   CARDIOLOGIST:  Lyn Records, M.D.   INDICATIONS FOR PROCEDURE:  Chest discomfort, difficulty with ambulation,  without chest discomfort.  A CT scan negative for pulmonary emboli.  This  study is being done to rule out coronary artery disease in a patient with a  strong family history and prior smoking history.   DESCRIPTION OF PROCEDURE:  After an informed consent, a 6-French sheath was  placed in the right femoral artery using the modified Seldinger technique.  A 6-French A2 multipurpose catheter was used for hemodynamic recordings,  left ventriculography by hand injection, and selective left and right  coronary angiography.  The patient tolerated the procedure without  complications.  A sheathogram was performed in the right femoral, and there  was not adequate access anatomy to allow safe percutaneous angiocele  closure.   RESULTS:  HEMODYNAMIC DATA  Aortic pressure:  96/72.  Left ventricular pressure:  98/5.  LEFT VENTRICULOGRAPHY:  The left ventricle was normal in size, and  demonstrates normal contractility.  An ejection fraction is 60%.  No  regional wall motion abnormality.  CORONARY ANGIOGRAPHY  1.  LEFT MAIN CORONARY ARTERY:  The left main coronary artery was normal.  2.  LEFT ANTERIOR DESCENDING CORONARY ARTERY:  The left anterior descending      coronary artery had irregularities noted proximally.  No obstruction is      noted in the LAD.  The LAD is transapical.  3.  RAMUS BRANCH:  The ramus branch arises  from the left main, and is      normal.  The ramus is moderate in size.  4.  CIRCUMFLEX CORONARY ARTERY:  The circumflex coronary artery gives origin      to one obtuse marginal, and this vessel was normal.  5.  RIGHT CORONARY ARTERY:  The right coronary artery was large and dominant      and gives origin to two large left ventricular branches.  The RCA is      normal.   CONCLUSION:  1.  Essentially normal coronary arteries.  2.  Normal left ventricular function.  3.  The symptoms at this point appear to be non-cardiac in origin.   PLAN:  1.  With a normal echocardiogram, I do not think that further cardiac      evaluation is necessary.  2.  The possibility of a small pulmonary emboli still remains, and      consideration to a V/Q scan should be given, since there was not a      definitive diagnostic sensitivity with the CT scan that was performed,  based upon the radiologist's interpretation.       HWS/MEDQ  D:  03/02/2004  T:  03/02/2004  Job:  [id number]   cc:   Soyla Murphy. Renne Crigler, M.D.  [address]  [place]  [place] [address]  Fax: [phone]

## 2010-10-14 NOTE — Procedures (Signed)
[place]  Patient:    [name], [name] Visit Number: [id number] MRN: [id number]          Service Type: END Location: ENDO Attending Physician:  Sabino Gasser Dictated by:   Sabino Gasser, M.D. Proc. Date: 03/12/01 Admit Date:  03/12/2001                             Procedure Report  PROCEDURE PERFORMED:  Colonoscopy.  ENDOSCOPIST:  Sabino Gasser, M.D.  INDICATIONS FOR PROCEDURE:  Rectal bleeding.  ANESTHESIA:  Demerol 120 mg, Versed 12 mg.  DESCRIPTION OF PROCEDURE:  With the patient mildly sedated in the left lateral decubitus position, the Olympus videoscopic colonoscope was inserted in the rectum and passed under direct vision into the cecum.  The cecum was identified by the ileocecal valve and appendiceal orifice, both of which were photographed.  From this point, the colonoscope was slowly withdrawn, taking circumferential views of the entire colonic mucosa stopping only in the rectum, which appeared normal on direct view and showed hemorrhoids on retroflex view.  The endoscope was straightened and withdrawn.  Patients vital signs and pulse oximeter remained stable.  The patient tolerated the procedure well and without apparent complications.  FINDINGS:  Internal hemorrhoids.  Otherwise unremarkable examination.  PLAN:  Follow up with me as needed. Dictated by:   Sabino Gasser, M.D. Attending Physician:  Sabino Gasser DD:  03/12/01 TD:  03/12/01 Job: [id number]

## 2010-10-14 NOTE — Discharge Summary (Signed)
[name], [name]                 ACCOUNT NO.:  [id number]   MEDICAL RECORD NO.:  [id number]          PATIENT TYPE:  INP   LOCATION:  4711                         FACILITY:  [facility]   PHYSICIAN:  [name], M.D.  DATE OF BIRTH:  [birth year]   DATE OF ADMISSION:  02/29/2004  DATE OF DISCHARGE:  03/03/2004                                 DISCHARGE SUMMARY   PRIMARY CARE DOCTOR:  Dr. Soyla Murphy. Pharr   PRIMARY DIAGNOSES:  1.  Shortness of breath likely secondary to chronic obstructive pulmonary      disease.  2.  Anxiety.  3.  Atypical chest pain.   SECONDARY DIAGNOSES:  1.  Chronic obstructive pulmonary disease.  2.  Depression.  3.  Anxiety.   DISCHARGE MEDICATIONS:  Trazodone 150 mg p.o. q.h.s.   PROCEDURES:  1.  CT chest angiogram dated February 29, 2004.  This reported as suboptimal      for __________other branch pulmonary emboli.  No signs of obstructing      embolus is seen.  2.  A 2-D echocardiogram dated March 01, 2004.  This showed overall left      ventricular systolic function normal, left ventricular ejection fraction      55% to 65%, no diagnostic evaluation of left ventricular regional wall      motion abnormalities, left ventricle wall thickness upper limits of      normal, right ventricular size was of upper limits of normal, mild      tricuspid regurgitation, no other valvular abnormalities.  3.  Ventilation perfusion scan dated March 02, 2004.  This was interpreted      as __________for pulmonary embolus.   CONSULTATIONS:  Cardiology, [name] Records, M.D./Dr. Aleen Campi,   ADMISSION HISTORY:  As per H&P of February 29, 2004 but briefly this is a 70-  year-old Caucasian female with a background history of depression and also  chronic obstructive airways disease who presented on February 29, 2004 with  increasing shortness of breath over the past 1 month associated with chest  discomfort/pressure.  Physical examination was quite unremarkable.  She was  admitted for further evaluation, investigation, and management.   HOSPITAL COURSE:  Problem 1.  SHORTNESS OF BREATH:  Initial concern was of  cardiomyopathy versus pulmonary embolism.  The patient was heparinized  initially and D-dimer levels were obtained which were somewhat elevated  cardiac function with __________at 0.88.  She had a CT angiogram which was  negative for central embolus or __________ over the branch.  Pulmonary  emboli could not be excluded.  Subsequent V/Q scan dated March 02, 2004 was  low probability for pulmonary embolus.  The patient was asymptomatic during  the course of her hospital stay.   Problem 2.  CHEST PAIN:  The main concern was to rule out coronary artery  disease.  She had a cardiac catheterization October 09/2003 which was  entirely normal.  Also serial EKGs and serial cardiac enzymes showed no  progressive changes.   COMMENT:  On further detailed discussion with the patient, the patient  admitted to having had  bronchitis in the past and apparently the patient  utilizes bronchodilators and nebulizers.  It is highly probable that this  may have been the cause of her shortness of breath.  Secondly pulmonary  embolus and significant coronary artery disease have been ruled out.  As  mentioned above, the patient was entirely asymptomatic during the course of  her hospital stay.  She was subsequently discharged on March 03, 2004 to  follow up with her PMD within 2 weeks.   DISPOSITION:  Discharged.   ACTIVITY:  As tolerated.   DIET:  Regular.      Chri   CO/MEDQ  D:  03/03/2004  T:  03/03/2004  Job:  [id number]   cc:   [name] Char, MD  [address]  [place]  [place] [address]  Fax: [phone]   [name], M.D.  [address]  [place]  [place] [address]  Fax: [phone]   [name], M.D.  [address]  [place]  [place] [address]  Fax: [phone]

## 2010-10-14 NOTE — Discharge Summary (Signed)
[name], [name]                 ACCOUNT NO.:  [id number]   MEDICAL RECORD NO.:  [id number]          PATIENT TYPE:  INP   LOCATION:  1610                         FACILITY:  [facility]   PHYSICIAN:  Mallory Shirk, MD     DATE OF BIRTH:  02-Sep-1940   DATE OF ADMISSION:  07/04/2004  DATE OF DISCHARGE:  07/09/2004                                 DISCHARGE SUMMARY   DISCHARGE DIAGNOSES:  1.  Chronic obstructive pulmonary disease exacerbation.  2.  Anxiety disorder.  3.  Depression.   DISCHARGE MEDICATIONS:  1.  Prednisone taper starting at 60 mg p.o. daily x3 days, then decreasing      in 10 mg increments x3 days to be tapered off in 15 days.  2.  Zithromax 250 mg p.o. daily x3 days for a total of a 7-day course.  3.  Albuterol inhaler 90 mcg two puffs q.4-6h. p.r.n. shortness of breath.  4.  Xopenex 0.63 nebulizers t.i.d.  5.  Trazodone 150 mg p.o. daily.  6.  Zoloft 100 mg p.o. daily.  7.  Advair Diskus 250/50 one puff p.o. b.i.d.   ALLERGIES:  PENICILLIN and SULFA cause angioedema.  ROFECOXIB and CELECOXIB  cause chest tightness.  CELEXA and MIRTAZAPINE cause weight gain.   FOLLOWUP APPOINTMENTS:  1.  With Dr. Merri Brunette, primary care physician within 5 days of      discharge.  2.  With Dr. Jetty Duhamel, pulmonology in 2 to 3 weeks as needed.   HISTORY OF PRESENT ILLNESS:  [name] is a 70 year old Caucasian woman with  a history of COPD and anxiety/depression who presented with a 10 to 14 day  history of progressively worsening shortness of breath, subjective fevers,  productive cough with streaks of blood initially, but clear now.  No sick  contacts.  No recent tobacco use.  The patient has a long history of  cigarette smoking.  Used to work for a cigarette company.  The patient had  no other complaints at the time of admission.   PAST MEDICAL HISTORY:  1.  COPD.  2.  Anxiety disorder.  3.  Depression.   ADMISSION MEDICATIONS:  1.  Advair 250/50 Diskus, one puff p.o.  b.i.d.  2.  Trazodone 150 mg p.o. q.h.s.  3.  Zoloft 100 mg p.o. daily.   ADMISSION PHYSICAL EXAMINATION:  VITAL SIGNS:  Blood pressure initially  161/99, subsequent reading after breathing treatment 118/68, pulse 97,  respirations 22, saturations 93% on 2 L, temperature 100.2.  GENERAL:  The patient appeared in no acute distress lying in bed, alert and  oriented x3.  HEENT:  Normocephalic, atraumatic, PERRL, sclerae anicteric.  Mucous  membranes moist.  Oropharynx nonerythematous.  Neck:  Supple, no LAD, no JVD.  LUNGS:  Diffuse bilateral wheezes.  Adequate air movements, no accessory  muscle use.  The patient is able to talk continuously without any symptoms.  CVS:  S1 plus S2, regular rate and rhythm, no murmurs, rubs, or gallops.  ABDOMEN:  Soft, positive bowel sounds.  No tenderness.  No masses.  EXTREMITIES:  No clubbing,  cyanosis, or edema.  NEUROLOGIC EXAM:  Nonfocal.   Chest x-ray significant for severe emphysematous disease.  No airspace  disease identified.  Heart size normal.  No focal bony abnormalities.   LABORATORY DATA:  WBCs 13.1, hemoglobin 12.9, hematocrit 37.3, MCV 89,  platelets 405.  Sodium 141, potassium 3.2, chloride 106, bicarbonate 28,  glucose 101, BUN 16, creatinine 1.1, calcium 8.9.  Albumin 3.3, AST 18, ALT  15, alkaline phosphatase 87, total bilirubin 0.6.   HOSPITAL COURSE:  The patient was admitted to the floor.  Problem 1. Chronic  obstructive pulmonary disease exacerbation.  The patient was treated with  albuterol, Atrovent nebulizers and IV Solu-Medrol.  She as also provided  oxygen by nasal cannula.  The patient's condition improved significantly  during her hospital stay.   The patient was seen by Dr. Jetty Duhamel, pulmonary.  He recommended the  patient try Xopenex nebulizers at home on a trial basis.  Dr. Maple Hudson will  follow the patient on an as needed basis.  On the day of discharge, the  patient was stable.  She said she was back to her  baseline.  Upon  ambulation, the patient's saturations dropped below 88%.  Hence, she was  prescribed oxygen by nasal cannula.  Case management is going to arrange for  the patient's oxygen to be brought to her house.   Problem 2.  Anxiety disorder and depression.  The patient's medications of  Zoloft and trazodone were continued.  She was also provided Xanax 0.5 mg  p.o. t.i.d.  There were no events related to her anxiety disorder and  depression during this hospital stay.   The patient was advised to follow up with Dr. Merri Brunette and Dr. Maple Hudson as  described above.  The patient was also advised to review all medications  with Dr. Renne Crigler and return to the emergency department immediately upon onset  of chest pain, shortness of breath, or any other symptoms that may need  medical attention.      GDK/MEDQ  D:  07/09/2004  T:  07/10/2004  Job:  [id number]   cc:   Soyla Murphy. Renne Crigler, M.D.  [address]  [place]  [place] [address]  Fax: [phone]   Clinton D. Maple Hudson, M.D.

## 2010-10-27 ENCOUNTER — Telehealth: Payer: Self-pay | Admitting: Internal Medicine

## 2010-10-27 MED ORDER — PREDNISONE 10 MG PO TABS: ORAL_TABLET | ORAL | Status: DC

## 2010-10-27 NOTE — Telephone Encounter (Signed)
Per CY-okay to give Prednisone 10mg #20 take 4 x 2 days, 3 x 2 days, 2 x 2 days, 1 x 2 days, then stop no refills.  

## 2010-10-27 NOTE — Telephone Encounter (Signed)
Called and spoke with pt.  Pt states she will be leaving out of town on vacation on Saturday and would like an rx for a pred taper to have on hand just in case.  Pt was recently seen by CY in April 2012.  Please advise.  Thanks.

## 2010-10-27 NOTE — Telephone Encounter (Signed)
rx was sent to pharm and pt made aware.

## 2010-11-11 ENCOUNTER — Other Ambulatory Visit: Payer: Self-pay | Admitting: Internal Medicine

## 2011-02-17 LAB — DIFFERENTIAL
Band Neutrophils: 0
Basophils Absolute: 0
Basophils Relative: 0
Blasts: 0
Eosinophils Absolute: 0
Eosinophils Relative: 0
Lymphocytes Relative: 2 — ABNORMAL LOW
Lymphs Abs: 0.3 — ABNORMAL LOW
Metamyelocytes Relative: 0
Monocytes Absolute: 0.1
Monocytes Relative: 1 — ABNORMAL LOW
Myelocytes: 0
Neutro Abs: 13.6 — ABNORMAL HIGH
Neutrophils Relative %: 97 — ABNORMAL HIGH
Promyelocytes Absolute: 0
nRBC: 0

## 2011-02-17 LAB — BASIC METABOLIC PANEL
BUN: 12
CO2: 31
CO2: 33 — ABNORMAL HIGH
Calcium: 8.7
Calcium: 8.9
Chloride: 107
Creatinine, Ser: 0.83
GFR calc Af Amer: >60
GFR calc Af Amer: >60
GFR calc non Af Amer: >60
GFR calc non Af Amer: >60
Glucose, Bld: 123 — ABNORMAL HIGH
Potassium: 4
Potassium: 4.6
Sodium: 140
Sodium: 144

## 2011-02-17 LAB — COMPREHENSIVE METABOLIC PANEL
ALT: 22
AST: 35
Albumin: 4
Alkaline Phosphatase: 49
BUN: 11
CO2: 23
Calcium: 8.8
Chloride: 104
Creatinine, Ser: 1.09
GFR calc Af Amer: >60
GFR calc non Af Amer: 51 — ABNORMAL LOW
Glucose, Bld: 194 — ABNORMAL HIGH
Potassium: 3.3 — ABNORMAL LOW
Sodium: 141
Total Bilirubin: 0.4
Total Protein: 6.5

## 2011-02-17 LAB — CBC
HCT: 44.4
HCT: 45.2
Hemoglobin: 15.1 — ABNORMAL HIGH
Hemoglobin: 15.2 — ABNORMAL HIGH
MCHC: 34.2
MCV: 90.1
Platelets: 240
RBC: 4.93
RBC: 5.04
RDW: 12.8
WBC: 14 — ABNORMAL HIGH

## 2011-02-17 LAB — APTT: aPTT: 24

## 2011-02-17 LAB — PROTIME-INR
INR: 0.9
Prothrombin Time: 12.4

## 2011-09-21 ENCOUNTER — Ambulatory Visit (INDEPENDENT_AMBULATORY_CARE_PROVIDER_SITE_OTHER): Payer: Medicare Other | Admitting: Internal Medicine

## 2011-09-21 ENCOUNTER — Ambulatory Visit: Payer: Medicare Other | Admitting: Internal Medicine

## 2011-09-21 ENCOUNTER — Encounter: Payer: Self-pay | Admitting: Internal Medicine

## 2011-09-21 VITALS — BP 132/82 | HR 80 | Ht 67.0 in | Wt 151.8 lb

## 2011-09-21 DIAGNOSIS — J449 Chronic obstructive pulmonary disease, unspecified: Secondary | ICD-10-CM

## 2011-09-21 MED ORDER — ACETAMINOPHEN-CODEINE 300-30 MG PO TABS: 1.0000 | ORAL_TABLET | ORAL | Status: DC | PRN

## 2011-09-21 NOTE — Progress Notes (Signed)
Patient ID: [name], female    DOB: 1940/06/18, 71 y.o.   MRN: [id number]  HPI 71 yo former smoker followed for COPD and allergic rhinitis, complicated by GERD and chronic back pain.. I last saw her September 15, 2009 with pollen rhinitis then. Notes reviewed. She says she has done well all winter. Now reports 4 days of head congestion, ears and nose stopped up, watery eyes, postnasal drip. Minor wheeze, but chest not bad yet. She blames pollen. Using sudafed and Neti pot without relief.   09/21/11- 58 yo former smoker followed for COPD and allergic rhinitis, complicated by GERD and chronic back pain. PCP Dr Renne Crigler She paces herself for shopping and with other activities. No routine cough. Asks refill Tylenol No. 3 keep on hand for her chronic back pain and also because of sometimes blunts dyspnea. I agreed this time but told her going forward she should get it from her primary physician.. He had just done chest x-ray as part of her regular physical exam and told her "okay". We reviewed her medications. At next visit we will discuss update PFT.  ROS-see HPI Constitutional:   No-   weight loss, night sweats, fevers, chills, fatigue, lassitude. HEENT:   No-  headaches, difficulty swallowing, tooth/dental problems, sore throat,       No-  sneezing, itching, ear ache, nasal congestion, post nasal drip,  CV:  No-   chest pain, orthopnea, PND, swelling in lower extremities, anasarca, dizziness, palpitations Resp: No-   shortness of breath with exertion or at rest.              No-   productive cough,  No non-productive cough,  No- coughing up of blood.              No-   change in color of mucus.  No- wheezing.   Skin: No-   rash or lesions. GI:  No-   heartburn, indigestion, abdominal pain, nausea, vomiting,  GU:  MS:  No-   joint pain or swelling.  Neuro-     nothing unusual Psych:  No- change in mood or affect. No depression or anxiety.  No memory loss.  OBJ- Physical Exam General- Alert,  Oriented, Affect-appropriate, Distress- none acute Skin- rash-none, lesions- none, excoriation- none Lymphadenopathy- none Head- atraumatic            Eyes- Gross vision intact, PERRLA, conjunctivae and secretions clear            Ears- Hearing, canals-normal            Nose- Clear, no-Septal dev, mucus, polyps, erosion, perforation             Throat- Mallampati II , mucosa clear , drainage- none, tonsils- atrophic Neck- flexible , trachea midline, no stridor , thyroid nl, carotid no bruit Chest - symmetrical excursion , unlabored           Heart/CV- RRR , no murmur , no gallop  , no rub, nl s1 s2                           - JVD- none , edema- none, stasis changes- none, varices- none           Lung- clear to P&A, wheeze- end expirtory, cough- none , dullness-none, rub- none           Chest wall-  Abd-  Br/ Gen/ Rectal- Not done, not indicated  Extrem- cyanosis- none, clubbing, none, atrophy- none, strength- nl Neuro- grossly intact to observation

## 2011-09-21 NOTE — Patient Instructions (Signed)
Sample Spiriva, try one daily. See if it improves shortness of breath with activity  Please call as needed

## 2011-09-22 ENCOUNTER — Other Ambulatory Visit: Payer: Self-pay | Admitting: Gastroenterology

## 2011-09-23 ENCOUNTER — Other Ambulatory Visit: Payer: Self-pay | Admitting: Internal Medicine

## 2011-09-24 NOTE — Assessment & Plan Note (Signed)
Controlled with no recent acute exacerbation. She has been off of Spiriva and we're giving sample to retry.

## 2012-01-30 ENCOUNTER — Telehealth: Payer: Self-pay | Admitting: Internal Medicine

## 2012-01-30 MED ORDER — LEVALBUTEROL HCL 0.63 MG/3ML IN NEBU: 1.0000 | INHALATION_SOLUTION | Freq: Four times a day (QID) | RESPIRATORY_TRACT | Status: DC | PRN

## 2012-01-30 NOTE — Telephone Encounter (Signed)
I spoke with pt and she is needing xopenex nebs sent to the pharmacy. rx has been sent and nothing further was needed

## 2012-09-20 ENCOUNTER — Telehealth: Payer: Self-pay | Admitting: Internal Medicine

## 2012-09-20 ENCOUNTER — Ambulatory Visit (INDEPENDENT_AMBULATORY_CARE_PROVIDER_SITE_OTHER)
Admission: RE | Admit: 2012-09-20 | Discharge: 2012-09-20 | Disposition: A | Discharge status: Home / Self Care | Payer: Medicare Other | Source: Ambulatory Visit | Attending: Internal Medicine | Admitting: Internal Medicine

## 2012-09-20 ENCOUNTER — Ambulatory Visit (INDEPENDENT_AMBULATORY_CARE_PROVIDER_SITE_OTHER): Payer: Medicare Other | Admitting: Internal Medicine

## 2012-09-20 ENCOUNTER — Encounter: Payer: Self-pay | Admitting: Internal Medicine

## 2012-09-20 VITALS — BP 124/80 | HR 83 | Ht 67.0 in | Wt 163.8 lb

## 2012-09-20 DIAGNOSIS — J449 Chronic obstructive pulmonary disease, unspecified: Secondary | ICD-10-CM

## 2012-09-20 DIAGNOSIS — J309 Allergic rhinitis, unspecified: Secondary | ICD-10-CM

## 2012-09-20 MED ORDER — ACLIDINIUM BROMIDE 400 MCG/ACT IN AEPB: 1.0000 | INHALATION_SPRAY | Freq: Two times a day (BID) | RESPIRATORY_TRACT | Status: DC

## 2012-09-20 MED ORDER — METHYLPREDNISOLONE ACETATE 80 MG/ML IJ SUSP
80.0000 mg | Freq: Once | INTRAMUSCULAR | Status: AC
  Administered 2012-09-20: 80 mg via INTRAMUSCULAR

## 2012-09-20 MED ORDER — AZELASTINE-FLUTICASONE 137-50 MCG/ACT NA SUSP: 1.0000 | Freq: Once | NASAL | Status: DC

## 2012-09-20 NOTE — Patient Instructions (Addendum)
Depo 24  Order- PFT  Dx COPD  Order- CXR  Dx COPD  Sample Dymista nasal spray   1-2 puffs each nostril once daily  Script Tudorza 1 puff twice daily  Please call if your other breathing meds need refilling

## 2012-09-20 NOTE — Telephone Encounter (Signed)
Called and [facility] not [facility] for the tudorza.  This has been called into the pharamcy and i called and spoke with pt and she is aware that [facility] is getting [facility] ready for her. Nothing further is needed.

## 2012-09-20 NOTE — Progress Notes (Signed)
Patient ID: [name], female    DOB: 03/08/1941, 72 y.o.   MRN: [id number]  HPI 72 yo former smoker followed for COPD and allergic rhinitis, complicated by GERD and chronic back pain.. I last saw her September 15, 2009 with pollen rhinitis then. Notes reviewed. She says she has done well all winter. Now reports 4 days of head congestion, ears and nose stopped up, watery eyes, postnasal drip. Minor wheeze, but chest not bad yet. She blames pollen. Using sudafed and Neti pot without relief.   09/21/11- 54 yo former smoker followed for COPD and allergic rhinitis, complicated by GERD and chronic back pain. PCP Dr Renne Crigler She paces herself for shopping and with other activities. No routine cough. Asks refill Tylenol No. 3 keep on hand for her chronic back pain and also because of sometimes blunts dyspnea. I agreed this time but told her going forward she should get it from her primary physician.. He had just done chest x-ray as part of her regular physical exam and told her "okay". We reviewed her medications. At next visit we will discuss update PFT.  09/20/12- 17 yo former smoker followed for COPD and allergic rhinitis, complicated by GERD and chronic back pain PCP Dr Renne Crigler. FOLLOWS FOR: Dr Renne Crigler gave sample of [employer] and feels this is helping her breathing-did not get a Rx. Occasional bronchitis with no major flareups. Sinus pressure, blowing and sneezing blamed on pollen. She expects general physical exam with Dr.Pharr next week.   ROS-see HPI Constitutional:   No-   weight loss, night sweats, fevers, chills, fatigue, lassitude. HEENT:   No-  headaches, difficulty swallowing, tooth/dental problems, sore throat,       +  sneezing, itching, ear ache, +nasal congestion, post nasal drip,  CV:  No-   chest pain, orthopnea, PND, swelling in lower extremities, anasarca, dizziness, palpitations Resp: + shortness of breath with exertion or at rest.              No-   productive cough,  No  non-productive cough,  No- coughing up of blood.              No-   change in color of mucus.  +occasional mild wheezing.   Skin: No-   rash or lesions. GI:  No-   heartburn, indigestion, abdominal pain, nausea, vomiting,  GU:  MS:  No-   joint pain or swelling. +chronic back pain Neuro-     nothing unusual Psych:  No- change in mood or affect. No depression or anxiety.  No memory loss.  OBJ- Physical Exam General- Alert, Oriented, Affect-appropriate, Distress- none acute Skin- rash-none, lesions- none, excoriation- none Lymphadenopathy- none Head- atraumatic            Eyes- Gross vision intact, PERRLA, conjunctivae and secretions clear            Ears- Hearing, canals-normal            Nose- Clear, no-Septal dev, mucus, polyps, erosion, perforation             Throat- Mallampati II , mucosa clear , drainage- none, tonsils- atrophic Neck- flexible , trachea midline, no stridor , thyroid nl, carotid no bruit Chest - symmetrical excursion , unlabored           Heart/CV- RRR , no murmur , no gallop  , no rub, nl s1 s2                           -  JVD- none , edema- none, stasis changes- none, varices- none           Lung-  wheeze+distant bilateral, cough- none , dullness-none, rub- none           Chest wall-  Abd-  Br/ Gen/ Rectal- Not done, not indicated Extrem- cyanosis- none, clubbing, none, atrophy- none, strength- nl Neuro- grossly intact to observation

## 2012-09-20 NOTE — Progress Notes (Signed)
Quick Note:  Pt aware of results. ______ 

## 2012-09-25 ENCOUNTER — Other Ambulatory Visit: Payer: Self-pay

## 2012-09-25 DIAGNOSIS — Z1231 Encounter for screening mammogram for malignant neoplasm of breast: Secondary | ICD-10-CM

## 2012-09-29 NOTE — Assessment & Plan Note (Signed)
Seasonal allergic rhinitis Plan-try sample Dymista. Compare this with over-the-counter antihistamines.

## 2012-09-29 NOTE — Assessment & Plan Note (Signed)
Adequate control. Minor exacerbation currently consistent with pollen season Plan-Depo-Medrol, chest x-ray. I deferred a prescription for Tylenol No. 3 to her PCP, since this is mostly for her chronic back pain

## 2012-10-02 ENCOUNTER — Ambulatory Visit: Payer: Medicare Other

## 2012-10-17 ENCOUNTER — Other Ambulatory Visit: Payer: Self-pay

## 2012-10-17 ENCOUNTER — Ambulatory Visit
Admission: RE | Admit: 2012-10-17 | Discharge: 2012-10-17 | Disposition: A | Discharge status: Home / Self Care | Payer: Medicare Other | Source: Ambulatory Visit

## 2012-10-17 DIAGNOSIS — Z1231 Encounter for screening mammogram for malignant neoplasm of breast: Secondary | ICD-10-CM

## 2012-10-26 ENCOUNTER — Other Ambulatory Visit: Payer: Self-pay | Admitting: Internal Medicine

## 2013-05-27 ENCOUNTER — Inpatient Hospital Stay (HOSPITAL_COMMUNITY)
Admission: EM | Admit: 2013-05-27 | Discharge: 2013-05-30 | DRG: 189 | Disposition: A | Discharge status: Home / Self Care | Payer: Medicare Other | Attending: Internal Medicine | Admitting: Internal Medicine

## 2013-05-27 ENCOUNTER — Encounter (HOSPITAL_COMMUNITY): Payer: Self-pay | Admitting: Emergency Medicine

## 2013-05-27 DIAGNOSIS — IMO0001 Reserved for inherently not codable concepts without codable children: Secondary | ICD-10-CM | POA: Diagnosis present

## 2013-05-27 DIAGNOSIS — F3289 Other specified depressive episodes: Secondary | ICD-10-CM | POA: Diagnosis present

## 2013-05-27 DIAGNOSIS — J101 Influenza due to other identified influenza virus with other respiratory manifestations: Secondary | ICD-10-CM | POA: Diagnosis present

## 2013-05-27 DIAGNOSIS — J9601 Acute respiratory failure with hypoxia: Secondary | ICD-10-CM

## 2013-05-27 DIAGNOSIS — J449 Chronic obstructive pulmonary disease, unspecified: Secondary | ICD-10-CM | POA: Diagnosis present

## 2013-05-27 DIAGNOSIS — F329 Major depressive disorder, single episode, unspecified: Secondary | ICD-10-CM | POA: Diagnosis present

## 2013-05-27 DIAGNOSIS — Z87891 Personal history of nicotine dependence: Secondary | ICD-10-CM

## 2013-05-27 DIAGNOSIS — J441 Chronic obstructive pulmonary disease with (acute) exacerbation: Secondary | ICD-10-CM | POA: Diagnosis present

## 2013-05-27 DIAGNOSIS — J9600 Acute respiratory failure, unspecified whether with hypoxia or hypercapnia: Principal | ICD-10-CM | POA: Diagnosis present

## 2013-05-27 DIAGNOSIS — J111 Influenza due to unidentified influenza virus with other respiratory manifestations: Secondary | ICD-10-CM | POA: Diagnosis present

## 2013-05-27 DIAGNOSIS — J9611 Chronic respiratory failure with hypoxia: Secondary | ICD-10-CM | POA: Diagnosis present

## 2013-05-27 DIAGNOSIS — Z8249 Family history of ischemic heart disease and other diseases of the circulatory system: Secondary | ICD-10-CM

## 2013-05-27 DIAGNOSIS — J189 Pneumonia, unspecified organism: Secondary | ICD-10-CM

## 2013-05-27 DIAGNOSIS — J4489 Other specified chronic obstructive pulmonary disease: Secondary | ICD-10-CM

## 2013-05-27 DIAGNOSIS — R0602 Shortness of breath: Secondary | ICD-10-CM | POA: Diagnosis present

## 2013-05-27 DIAGNOSIS — J40 Bronchitis, not specified as acute or chronic: Secondary | ICD-10-CM

## 2013-05-27 DIAGNOSIS — Z833 Family history of diabetes mellitus: Secondary | ICD-10-CM

## 2013-05-27 DIAGNOSIS — J45909 Unspecified asthma, uncomplicated: Secondary | ICD-10-CM

## 2013-05-27 DIAGNOSIS — M791 Myalgia, unspecified site: Secondary | ICD-10-CM | POA: Diagnosis present

## 2013-05-27 DIAGNOSIS — J309 Allergic rhinitis, unspecified: Secondary | ICD-10-CM

## 2013-05-27 DIAGNOSIS — K219 Gastro-esophageal reflux disease without esophagitis: Secondary | ICD-10-CM

## 2013-05-27 LAB — BASIC METABOLIC PANEL
BUN: 16 mg/dL (ref 6–23)
CO2: 28 mEq/L (ref 19–32)
Calcium: 8.4 mg/dL (ref 8.4–10.5)
Chloride: 102 mEq/L (ref 96–112)
Creatinine, Ser: 0.78 mg/dL (ref 0.50–1.10)
GFR calc Af Amer: >90 mL/min (ref >90)
GFR calc non Af Amer: 83 mL/min — ABNORMAL LOW (ref >90)
Glucose, Bld: 94 mg/dL (ref 70–99)
Potassium: 4 mEq/L (ref 3.7–5.3)
Sodium: 143 mEq/L (ref 137–147)

## 2013-05-27 LAB — CBC WITH DIFFERENTIAL/PLATELET
Basophils Absolute: 0 10*3/uL (ref 0.0–0.1)
Basophils Relative: 1 % (ref 0–1)
Eosinophils Absolute: 0 10*3/uL (ref 0.0–0.7)
Eosinophils Relative: 0 % (ref 0–5)
HCT: 43.4 % (ref 36.0–46.0)
Hemoglobin: 14.1 g/dL (ref 12.0–15.0)
Lymphocytes Relative: 12 % (ref 12–46)
Lymphs Abs: 0.8 10*3/uL (ref 0.7–4.0)
MCH: 30.5 pg (ref 26.0–34.0)
MCHC: 32.5 g/dL (ref 30.0–36.0)
MCV: 93.7 fL (ref 78.0–100.0)
Monocytes Absolute: 0.9 10*3/uL (ref 0.1–1.0)
Monocytes Relative: 14 % — ABNORMAL HIGH (ref 3–12)
Neutro Abs: 4.8 10*3/uL (ref 1.7–7.7)
Neutrophils Relative %: 74 % (ref 43–77)
Platelets: 206 10*3/uL (ref 150–400)
RBC: 4.63 MIL/uL (ref 3.87–5.11)
RDW: 13.1 % (ref 11.5–15.5)
WBC: 6.6 10*3/uL (ref 4.0–10.5)

## 2013-05-27 MED ORDER — DIPHENHYDRAMINE HCL 50 MG PO CAPS
50.0000 mg | ORAL_CAPSULE | Freq: Once | ORAL | Status: AC
  Administered 2013-05-27: 50 mg via ORAL
  Filled 2013-05-27: qty 2
  Filled 2013-05-27: qty 1

## 2013-05-27 MED ORDER — KETOROLAC TROMETHAMINE 15 MG/ML IJ SOLN
15.0000 mg | Freq: Once | INTRAMUSCULAR | Status: AC
  Administered 2013-05-27: 15 mg via INTRAVENOUS
  Filled 2013-05-27: qty 1

## 2013-05-27 MED ORDER — ACETAMINOPHEN 650 MG RE SUPP: 650.0000 mg | Freq: Four times a day (QID) | RECTAL | Status: DC | PRN

## 2013-05-27 MED ORDER — PANTOPRAZOLE SODIUM 40 MG PO TBEC
40.0000 mg | DELAYED_RELEASE_TABLET | Freq: Every day | ORAL | Status: DC
  Administered 2013-05-28 – 2013-05-30 (×3): 40 mg via ORAL
  Filled 2013-05-27 (×2): qty 1

## 2013-05-27 MED ORDER — SODIUM CHLORIDE 0.9 % IV BOLUS (SEPSIS)
1000.0000 mL | Freq: Once | INTRAVENOUS | Status: AC
  Administered 2013-05-27: 1000 mL via INTRAVENOUS

## 2013-05-27 MED ORDER — ALBUTEROL SULFATE (2.5 MG/3ML) 0.083% IN NEBU
5.0000 mg | INHALATION_SOLUTION | Freq: Once | RESPIRATORY_TRACT | Status: AC
  Administered 2013-05-27: 5 mg via RESPIRATORY_TRACT
  Filled 2013-05-27: qty 6

## 2013-05-27 MED ORDER — TRAZODONE HCL 100 MG PO TABS
100.0000 mg | ORAL_TABLET | Freq: Every day | ORAL | Status: DC
  Administered 2013-05-27 – 2013-05-28 (×2): 100 mg via ORAL
  Filled 2013-05-27 (×3): qty 1

## 2013-05-27 MED ORDER — ACETAMINOPHEN 325 MG PO TABS
650.0000 mg | ORAL_TABLET | Freq: Four times a day (QID) | ORAL | Status: DC | PRN
  Administered 2013-05-27: 650 mg via ORAL
  Filled 2013-05-27: qty 2

## 2013-05-27 MED ORDER — LEVOFLOXACIN IN D5W 750 MG/150ML IV SOLN
750.0000 mg | INTRAVENOUS | Status: DC
  Administered 2013-05-28 – 2013-05-30 (×3): 750 mg via INTRAVENOUS
  Filled 2013-05-27 (×3): qty 150

## 2013-05-27 MED ORDER — GUAIFENESIN-DM 100-10 MG/5ML PO SYRP
5.0000 mL | ORAL_SOLUTION | ORAL | Status: DC | PRN
  Filled 2013-05-27: qty 5

## 2013-05-27 MED ORDER — ONDANSETRON HCL 4 MG PO TABS: 4.0000 mg | ORAL_TABLET | Freq: Four times a day (QID) | ORAL | Status: DC | PRN

## 2013-05-27 MED ORDER — METHYLPREDNISOLONE SODIUM SUCC 125 MG IJ SOLR
80.0000 mg | Freq: Once | INTRAMUSCULAR | Status: AC
  Administered 2013-05-27: 80 mg via INTRAVENOUS
  Filled 2013-05-27: qty 2

## 2013-05-27 MED ORDER — ALBUTEROL SULFATE (2.5 MG/3ML) 0.083% IN NEBU
2.5000 mg | INHALATION_SOLUTION | Freq: Two times a day (BID) | RESPIRATORY_TRACT | Status: DC
  Administered 2013-05-28 – 2013-05-30 (×5): 2.5 mg via RESPIRATORY_TRACT
  Filled 2013-05-27 (×12): qty 3

## 2013-05-27 MED ORDER — ALBUTEROL SULFATE HFA 108 (90 BASE) MCG/ACT IN AERS: 2.0000 | INHALATION_SPRAY | Freq: Four times a day (QID) | RESPIRATORY_TRACT | Status: DC | PRN

## 2013-05-27 MED ORDER — MOMETASONE FURO-FORMOTEROL FUM 100-5 MCG/ACT IN AERO
2.0000 | INHALATION_SPRAY | Freq: Two times a day (BID) | RESPIRATORY_TRACT | Status: DC
  Administered 2013-05-27 – 2013-05-30 (×6): 2 via RESPIRATORY_TRACT
  Filled 2013-05-27: qty 8.8

## 2013-05-27 MED ORDER — ONDANSETRON HCL 4 MG/2ML IJ SOLN
4.0000 mg | Freq: Once | INTRAMUSCULAR | Status: AC
  Administered 2013-05-27: 4 mg via INTRAVENOUS
  Filled 2013-05-27: qty 2

## 2013-05-27 MED ORDER — GUAIFENESIN ER 600 MG PO TB12
1200.0000 mg | ORAL_TABLET | Freq: Two times a day (BID) | ORAL | Status: DC
  Administered 2013-05-27 – 2013-05-30 (×6): 1200 mg via ORAL
  Filled 2013-05-27 (×7): qty 2

## 2013-05-27 MED ORDER — MORPHINE SULFATE 4 MG/ML IJ SOLN
4.0000 mg | Freq: Once | INTRAMUSCULAR | Status: AC
  Administered 2013-05-27: 4 mg via INTRAVENOUS
  Filled 2013-05-27: qty 1

## 2013-05-27 MED ORDER — SODIUM CHLORIDE 0.9 % IV SOLN
INTRAVENOUS | Status: DC
  Administered 2013-05-27 – 2013-05-28 (×3): via INTRAVENOUS

## 2013-05-27 MED ORDER — TIOTROPIUM BROMIDE MONOHYDRATE 18 MCG IN CAPS
18.0000 ug | ORAL_CAPSULE | Freq: Every day | RESPIRATORY_TRACT | Status: DC
  Administered 2013-05-28 – 2013-05-30 (×3): 18 ug via RESPIRATORY_TRACT
  Filled 2013-05-27 (×2): qty 5

## 2013-05-27 MED ORDER — ALBUTEROL SULFATE (2.5 MG/3ML) 0.083% IN NEBU
2.5000 mg | INHALATION_SOLUTION | Freq: Four times a day (QID) | RESPIRATORY_TRACT | Status: DC
  Administered 2013-05-27: 2.5 mg via RESPIRATORY_TRACT
  Filled 2013-05-27: qty 3

## 2013-05-27 MED ORDER — OSELTAMIVIR PHOSPHATE 75 MG PO CAPS
75.0000 mg | ORAL_CAPSULE | Freq: Two times a day (BID) | ORAL | Status: DC
  Administered 2013-05-27 – 2013-05-30 (×6): 75 mg via ORAL
  Filled 2013-05-27 (×7): qty 1

## 2013-05-27 MED ORDER — MORPHINE SULFATE 2 MG/ML IJ SOLN
1.0000 mg | INTRAMUSCULAR | Status: DC | PRN
  Filled 2013-05-27: qty 1

## 2013-05-27 MED ORDER — SIMVASTATIN 20 MG PO TABS
20.0000 mg | ORAL_TABLET | Freq: Every day | ORAL | Status: DC
  Administered 2013-05-27 – 2013-05-29 (×3): 20 mg via ORAL
  Filled 2013-05-27 (×4): qty 1

## 2013-05-27 MED ORDER — LEVOFLOXACIN 500 MG PO TABS
500.0000 mg | ORAL_TABLET | Freq: Once | ORAL | Status: AC
  Administered 2013-05-27: 500 mg via ORAL
  Filled 2013-05-27: qty 1

## 2013-05-27 MED ORDER — ONDANSETRON HCL 4 MG/2ML IJ SOLN: 4.0000 mg | Freq: Four times a day (QID) | INTRAMUSCULAR | Status: DC | PRN

## 2013-05-27 MED ORDER — IPRATROPIUM BROMIDE 0.02 % IN SOLN
0.5000 mg | Freq: Once | RESPIRATORY_TRACT | Status: AC
  Administered 2013-05-27: 0.5 mg via RESPIRATORY_TRACT
  Filled 2013-05-27: qty 2.5

## 2013-05-27 MED ORDER — HYDROCODONE-ACETAMINOPHEN 5-325 MG PO TABS: 1.0000 | ORAL_TABLET | ORAL | Status: DC | PRN

## 2013-05-27 MED ORDER — TRAZODONE HCL 100 MG PO TABS
100.0000 mg | ORAL_TABLET | Freq: Every evening | ORAL | Status: DC | PRN
  Filled 2013-05-27: qty 1

## 2013-05-27 MED ORDER — HEPARIN SODIUM (PORCINE) 5000 UNIT/ML IJ SOLN
5000.0000 [IU] | Freq: Three times a day (TID) | INTRAMUSCULAR | Status: DC
  Filled 2013-05-27 (×11): qty 1

## 2013-05-27 MED ORDER — METHYLPREDNISOLONE SODIUM SUCC 125 MG IJ SOLR
80.0000 mg | Freq: Three times a day (TID) | INTRAMUSCULAR | Status: DC
  Administered 2013-05-27 – 2013-05-29 (×5): 80 mg via INTRAVENOUS
  Filled 2013-05-27: qty 2
  Filled 2013-05-27 (×8): qty 1.28

## 2013-05-27 MED ORDER — ALBUTEROL SULFATE (2.5 MG/3ML) 0.083% IN NEBU: 2.5000 mg | INHALATION_SOLUTION | Freq: Four times a day (QID) | RESPIRATORY_TRACT | Status: DC | PRN

## 2013-05-27 NOTE — ED Notes (Signed)
Pt arrives via EMS from PMD office with SOB for the last 3 days. They did CXR in office which showed poss RML pneumonia. Pt RA sat 88%. Placed on 3L [place] with sat of 96%. Received 5mg  albuterol/0.5mg  atrovent in office. 20g L hand. Pt ambulatory to bathroom. VSS for EMS. Temp to 100 at home per patient report.

## 2013-05-27 NOTE — H&P (Signed)
[facility] History and Physical  KESTREL MIS ZOX:[id number] DOB: 1940/06/16 DOA: 05/27/2013  Referring physician: Dr. Raeford Razor PCP: Londell Moh, MD   Chief Complaint: Shortness of breath  HPI: [name] is a 72 y.o. female with past medical history of COPD tobacco abuse disorder. Patient came in to the hospital because of shortness of. Apparently patient did have shortness of breath, nonproductive cough since Sunday. Symptoms started to worsen yesterday and she did have pain all over. Today she developed fever so she went to her primary care physician for further evaluation. Chest x-ray was done over there showed no evidence of pneumonia. In the ED blood work showed no abnormalities, patient had oxygen saturation of 79% on room air with ambulation, she had fever of 100.1.  Review of Systems:  Constitutional: Myalgia and generalized weakness Eyes: negative for irritation, redness and visual disturbance Ears, nose, mouth, throat, and face: negative for earaches, epistaxis, nasal congestion and sore throat Respiratory: Has cough, dyspnea and wheezing Cardiovascular: negative for chest pain, dyspnea, lower extremity edema, orthopnea, palpitations and syncope Gastrointestinal: negative for abdominal pain, constipation, diarrhea, melena, nausea and vomiting Genitourinary:negative for dysuria, frequency and hematuria Hematologic/lymphatic: negative for bleeding, easy bruising and lymphadenopathy Musculoskeletal:negative for arthralgias, muscle weakness and stiff joints Neurological: negative for coordination problems, gait problems, headaches and weakness Endocrine: negative for diabetic symptoms including polydipsia, polyuria and weight loss Allergic/Immunologic: negative for anaphylaxis, hay fever and urticaria    Past Medical History  Diagnosis Date  . Allergic rhinitis   . Asthma   . Chronic airway obstruction, not elsewhere classified   . Esophageal reflux    . Depressive disorder, not elsewhere classified    Past Surgical History  Procedure Laterality Date  . Abdominal hysterectomy    . Nasal sinus surgery    . Cystoscopy     Social History:  reports that she quit smoking about 3 years ago. Her smoking use included Cigarettes. She has a 30 pack-year smoking history. She does not have any smokeless tobacco history on file. She reports that she does not drink alcohol or use illicit drugs.  Allergies  Allergen Reactions  . Clarithromycin Swelling  . Penicillins Swelling    Tongue swelling  . Pneumococcal Vaccines Other (See Comments)    Couldn't breathe  . Sulfonamide Derivatives Swelling    Family History  Problem Relation Age of Onset  . Diabetes Mother   . Emphysema Father   . Coronary artery disease Brother      Prior to Admission medications   Medication Sig Start Date End Date Taking? Authorizing Provider  albuterol (PROVENTIL HFA;VENTOLIN HFA) 108 (90 BASE) MCG/ACT inhaler Inhale 2 puffs into the lungs every 6 (six) hours as needed for wheezing or shortness of breath.   Yes Historical Provider, MD  albuterol (PROVENTIL) (2.5 MG/3ML) 0.083% nebulizer solution Take 2.5 mg by nebulization every 6 (six) hours as needed for wheezing or shortness of breath.   Yes Historical Provider, MD  DIPHENHYDRAMINE-ACETAMINOPHEN PO Take 2 tablets by mouth at bedtime.   Yes Historical Provider, MD  Fluticasone-Salmeterol (ADVAIR) 250-50 MCG/DOSE AEPB Inhale 1 puff into the lungs 2 (two) times daily.   Yes Historical Provider, MD  omeprazole (PRILOSEC) 20 MG capsule Take 1 tablet by mouth daily. 09/13/11  Yes Historical Provider, MD  simvastatin (ZOCOR) 20 MG tablet Take 20 mg by mouth at bedtime.     Yes Historical Provider, MD  tiotropium (SPIRIVA) 18 MCG inhalation capsule Place 18 mcg into inhaler and  inhale daily with lunch.    Yes Historical Provider, MD  traZODone (DESYREL) 100 MG tablet Take 100 mg by mouth at bedtime.    Yes Historical  Provider, MD   Physical Exam: Filed Vitals:   05/27/13 1615  BP: 129/79  Pulse: 109  Temp:   Resp: 20    BP 129/79  Pulse 109  Temp(Src) 100.1 F (37.8 C) (Oral)  Resp 20  SpO2 92% General appearance: alert, cooperative and no distress  Head: Normocephalic, without obvious abnormality, atraumatic  Eyes: conjunctivae/corneas clear. PERRL, EOM's intact. Fundi benign.  Nose: Nares normal. Septum midline. Mucosa normal. No drainage or sinus tenderness.  Throat: lips, mucosa, and tongue normal; teeth and gums normal  Neck: Supple, no masses, no cervical lymphadenopathy, no JVD appreciated, no meningeal signs Resp: Expiratory wheezes Chest wall: no tenderness  Cardio: Tachycardic but regular rhythm, S1, S2 normal, no murmur, click, rub or gallop  GI: soft, non-tender; bowel sounds normal; no masses, no organomegaly  Extremities: extremities normal, atraumatic, no cyanosis or edema  Skin: Skin color, texture, turgor normal. No rashes or lesions  Neurologic: Alert and oriented X 3, normal strength and tone. Normal symmetric reflexes. Normal coordination and gait  Labs on Admission:  Basic Metabolic Panel:  Recent Labs Lab 05/27/13 1437  NA 143  K 4.0  CL 102  CO2 28  GLUCOSE 94  BUN 16  CREATININE 0.78  CALCIUM 8.4   Liver Function Tests: No results found for this basename: AST, ALT, ALKPHOS, BILITOT, PROT, ALBUMIN,  in the last 168 hours No results found for this basename: LIPASE, AMYLASE,  in the last 168 hours No results found for this basename: AMMONIA,  in the last 168 hours CBC:  Recent Labs Lab 05/27/13 1437  WBC 6.6  NEUTROABS 4.8  HGB 14.1  HCT 43.4  MCV 93.7  PLT 206   Cardiac Enzymes: No results found for this basename: CKTOTAL, CKMB, CKMBINDEX, TROPONINI,  in the last 168 hours  BNP (last 3 results) No results found for this basename: PROBNP,  in the last 8760 hours CBG: No results found for this basename: GLUCAP,  in the last 168  hours  Radiological Exams on Admission: No results found.  EKG: Independently reviewed.   Assessment/Plan Principal Problem:   COPD with acute exacerbation Active Problems:   COPD with chronic bronchitis   SOB (shortness of breath)   Myalgia    Acute hypoxic respiratory failure -Secondary to COPD exacerbation, patient oxygen saturation 79% on room air with ambulation per physician report. -Placed on supplemental oxygen. -We'll treat the COPD exacerbation as the cause.  Acute COPD exacerbation -Chest x-ray (available on PACS) showed no evidence of pneumonia or edema. -Started on Levaquin, IV Solu-Medrol and bronchodilators. -Supportive management with mucolytics, antitussives and oxygen as needed.   Myalgia -Does not remember sick contact, I will check flu PCR. -Started empirically on Tamiflu.  Code Status:  Full code Family Communication:  Plan discussed with the patient for the presence of her son at bedside Disposition Plan:  Inpatient, anticipate length of stay to be greater than 2 midnights, MedSurg.  Time spent: 70 minutes  [facility] A [facility] Pager [phone]

## 2013-05-27 NOTE — ED Provider Notes (Signed)
CSN: [id number]     Arrival date & time 05/27/13  1407 History   First MD Initiated Contact with Patient 05/27/13 1414     Chief Complaint  Patient presents with  . Shortness of Breath   (Consider location/radiation/quality/duration/timing/severity/associated sxs/prior Treatment) HPI  [name] is a 72 y.o. female with past medical history of COPD tobacco abuse disorder. Patient came in to the hospital because of shortness of. Apparently patient did have shortness of breath, nonproductive cough since Sunday. Symptoms started to worsen yesterday and she did have pain all over. Today she developed fever so she went to her primary care physician for further evaluation. She apparently had a chest x-ray which showed right-sided pneumonia. She is certain steroids antibiotics. Despite this her symptoms don't steadily improve so she came to emergency room now.   Past Medical History  Diagnosis Date  . Allergic rhinitis   . Asthma   . Chronic airway obstruction, not elsewhere classified   . Esophageal reflux   . Depressive disorder, not elsewhere classified    Past Surgical History  Procedure Laterality Date  . Abdominal hysterectomy    . Nasal sinus surgery    . Cystoscopy     Family History  Problem Relation Age of Onset  . Diabetes Mother   . Emphysema Father   . Coronary artery disease Brother    History  Substance Use Topics  . Smoking status: Former Smoker -- 1.00 packs/day for 30 years    Types: Cigarettes    Quit date: 05/29/2009  . Smokeless tobacco: Not on file  . Alcohol Use: No   OB History   Grav Para Term Preterm Abortions TAB SAB Ect Mult Living                 Review of Systems  All systems reviewed and negative, other than as noted in HPI.   Allergies  Clarithromycin; Penicillins; Pneumococcal vaccines; and Sulfonamide derivatives  Home Medications   Current Outpatient Rx  Name  Route  Sig  Dispense  Refill  . albuterol (PROVENTIL HFA;VENTOLIN  HFA) 108 (90 BASE) MCG/ACT inhaler   Inhalation   Inhale 2 puffs into the lungs every 6 (six) hours as needed for wheezing or shortness of breath.         Marland Kitchen albuterol (PROVENTIL) (2.5 MG/3ML) 0.083% nebulizer solution   Nebulization   Take 2.5 mg by nebulization every 6 (six) hours as needed for wheezing or shortness of breath.         Marland Kitchen DIPHENHYDRAMINE-ACETAMINOPHEN PO   Oral   Take 2 tablets by mouth at bedtime.         . Fluticasone-Salmeterol (ADVAIR) 250-50 MCG/DOSE AEPB   Inhalation   Inhale 1 puff into the lungs 2 (two) times daily.         Marland Kitchen omeprazole (PRILOSEC) 20 MG capsule   Oral   Take 1 tablet by mouth daily.         . simvastatin (ZOCOR) 20 MG tablet   Oral   Take 20 mg by mouth at bedtime.           Marland Kitchen tiotropium (SPIRIVA) 18 MCG inhalation capsule   Inhalation   Place 18 mcg into inhaler and inhale daily with lunch.          . traZODone (DESYREL) 100 MG tablet   Oral   Take 100 mg by mouth at bedtime.           BP 163/77  Pulse 115  Temp(Src) 100.1 F (37.8 C) (Oral)  Resp 35  SpO2 93% Physical Exam  Nursing note and vitals reviewed. Constitutional: She appears well-developed and well-nourished. No distress.  HENT:  Head: Normocephalic and atraumatic.  Eyes: Conjunctivae are normal. Right eye exhibits no discharge. Left eye exhibits no discharge.  Neck: Neck supple.  Cardiovascular: Normal rate, regular rhythm and normal heart sounds.  Exam reveals no gallop and no friction rub.   No murmur heard. Pulmonary/Chest: Effort normal. No respiratory distress. She has wheezes.  Abdominal: Soft. She exhibits no distension. There is no tenderness.  Musculoskeletal: She exhibits no edema and no tenderness.  Lower extremities symmetric as compared to each other. No calf tenderness. Negative Homan's. No palpable cords.   Neurological: She is alert.  Skin: Skin is warm and dry.  Psychiatric: She has a normal mood and affect. Her behavior is  normal. Thought content normal.    ED Course  Procedures (including critical care time) Labs Review Labs Reviewed  CBC WITH DIFFERENTIAL - Abnormal; Notable for the following:    Monocytes Relative 14 (*)    All other components within normal limits  BASIC METABOLIC PANEL - Abnormal; Notable for the following:    GFR calc non Af Amer 83 (*)    All other components within normal limits   Imaging Review No results found.   EKG Interpretation    Date/Time:  Tuesday May 27 2013 14:19:57 EST Ventricular Rate:  107 PR Interval:  141 QRS Duration: 81 QT Interval:  342 QTC Calculation: 456 R Axis:   52 Text Interpretation:  Sinus tachycardia ED PHYSICIAN INTERPRETATION AVAILABLE IN [facility] Confirmed by TEST, RECORD ([id number]) on 05/29/2013 11:35:14 AM            MDM   1. CAP (community acquired pneumonia)   2. COPD exacerbation   3. Acute respiratory failure with hypoxia   4. COPD with acute exacerbation   5. Myalgia   6. SOB (shortness of breath)   7. Unspecified asthma(493.90)   8. Bronchitis, not specified as acute or chronic   9. COPD with chronic bronchitis   10. Influenza A with respiratory manifestations   11. ALLERGIC RHINITIS   12. BRONCHITIS   13. ASTHMA   14. Esophageal reflux     72 year old female with dyspnea. PCP did CXR which apparently showed R sided pneumonia. Tx'd with steroids, abx and nebs. Of note, she had been self treating herself the past day with cipro and prednisone.  Pt anxious to be discharged, but when ambulating she desaturated to 70% on room air. She has no home oxygen requirement. Will admit for CAP/COPD exacerbation.     Raeford Razor, MD 06/01/13 1754

## 2013-05-28 DIAGNOSIS — J101 Influenza due to other identified influenza virus with other respiratory manifestations: Secondary | ICD-10-CM | POA: Diagnosis present

## 2013-05-28 DIAGNOSIS — J449 Chronic obstructive pulmonary disease, unspecified: Secondary | ICD-10-CM

## 2013-05-28 DIAGNOSIS — J40 Bronchitis, not specified as acute or chronic: Secondary | ICD-10-CM

## 2013-05-28 DIAGNOSIS — J111 Influenza due to unidentified influenza virus with other respiratory manifestations: Secondary | ICD-10-CM

## 2013-05-28 LAB — INFLUENZA PANEL BY PCR (TYPE A & B)
Influenza A By PCR: POSITIVE — AB
Influenza B By PCR: NEGATIVE

## 2013-05-28 LAB — CBC
Hemoglobin: 13.7 g/dL (ref 12.0–15.0)
MCHC: 32 g/dL (ref 30.0–36.0)
Platelets: 235 10*3/uL (ref 150–400)
RBC: 4.54 MIL/uL (ref 3.87–5.11)

## 2013-05-28 LAB — HEMOGLOBIN A1C: Hgb A1c MFr Bld: 5.8 % — ABNORMAL HIGH (ref <5.7)

## 2013-05-28 LAB — TSH: TSH: 0.334 u[IU]/mL — ABNORMAL LOW (ref 0.350–4.500)

## 2013-05-28 LAB — BASIC METABOLIC PANEL
GFR calc Af Amer: >90 mL/min (ref >90)
GFR calc non Af Amer: 87 mL/min — ABNORMAL LOW (ref >90)
Potassium: 4.8 mEq/L (ref 3.7–5.3)
Sodium: 147 mEq/L (ref 137–147)

## 2013-05-28 MED ORDER — BIOTENE DRY MOUTH MT LIQD
15.0000 mL | Freq: Two times a day (BID) | OROMUCOSAL | Status: DC
  Administered 2013-05-28 – 2013-05-30 (×4): 15 mL via OROMUCOSAL

## 2013-05-28 NOTE — Progress Notes (Signed)
SATURATION QUALIFICATIONS:   Patient Saturations on Room Air at Rest = 92% Patient Saturations on Room Air while Ambulating = 81% Patient Saturations on 2 Liters of oxygen while Ambulating = 88%   Please briefly explain why patient needs home oxygen: Pt desats while ambulating

## 2013-05-28 NOTE — Progress Notes (Signed)
   CARE MANAGEMENT NOTE 05/28/2013  Patient:  [name], [name]   Account Number:  [id number]  Date Initiated:  05/28/2013  Documentation initiated by:  Darlyne Russian  Subjective/Objective Assessment:   admitted from MD office, c/o  increased SOB, COPD exacerbation, O2 sats 88%     Action/Plan:   progression of care and discharge planning   Anticipated DC Date:  05/31/2013   Anticipated DC Plan:  HOME/SELF CARE      DC Planning Services  CM consult      Choice offered to / List presented to:             Status of service:  In process, will continue to follow Medicare Important Message given?   (If response is "NO", the following Medicare IM given date fields will be blank) Date Medicare IM given:   Date Additional Medicare IM given:    Discharge Disposition:    Per UR Regulation:  Reviewed for med. necessity/level of care/duration of stay  If discussed at Long Length of Stay Meetings, dates discussed:    Comments:

## 2013-05-28 NOTE — Progress Notes (Signed)
[facility] PROGRESS NOTE  [name]:[id number] DOB: 09-14-1940 DOA: 05/27/2013 PCP: Londell Moh, MD  HPI/Subjective: Still has some shortness of breath, oxygen saturation was 81% with ambulation.  Assessment/Plan: Principal Problem:   Acute respiratory failure with hypoxia Active Problems:   COPD with chronic bronchitis   SOB (shortness of breath)   Myalgia   COPD with acute exacerbation   Acute hypoxic respiratory failure  -Secondary to COPD exacerbation, patient oxygen saturation 79% on room air with ambulation per physician report.  -Placed on supplemental oxygen.  -We'll treat the COPD exacerbation as the cause.   Influenza type A with respiratory manifestations -Fever, myalgia and respiratory symptoms. -Started on Tamiflu, continue for 5 days.  Acute COPD exacerbation  -Chest x-ray (available on PACS) showed no evidence of pneumonia or edema.  -Started on Levaquin, IV Solu-Medrol and bronchodilators.  -Supportive management with mucolytics, antitussives and oxygen as needed.  -She still hypoxic and requires 2-3 L of oxygen at rest. Likely she'll be discharged on oxygen.  Myalgia  -Does not remember sick contact, I will check flu PCR.  -Started empirically on Tamiflu.   Code Status: Full code Family Communication: Plan discussed with the patient. Disposition Plan: Remains inpatient   Consultants:  None  Procedures:  None  Antibiotics:  Levaquin   Objective: Filed Vitals:   05/28/13 1331  BP: 122/77  Pulse: 92  Temp: 97.8 F (36.6 C)  Resp: 20    Intake/Output Summary (Last 24 hours) at 05/28/13 1502 Last data filed at 05/28/13 1332  Gross per 24 hour  Intake    480 ml  Output      0 ml  Net    480 ml   Filed Weights   05/27/13 1844  Weight: 74.889 kg (165 lb 1.6 oz)    Exam: General: Alert and awake, oriented x3, not in any acute distress. HEENT: anicteric sclera, pupils reactive to light and accommodation,  EOMI CVS: S1-S2 clear, no murmur rubs or gallops Chest: clear to auscultation bilaterally, no wheezing, rales or rhonchi Abdomen: soft nontender, nondistended, normal bowel sounds, no organomegaly Extremities: no cyanosis, clubbing or edema noted bilaterally Neuro: Cranial nerves II-XII intact, no focal neurological deficits  Data Reviewed: Basic Metabolic Panel:  Recent Labs Lab 05/27/13 1437 05/28/13 0545  NA 143 147  K 4.0 4.8  CL 102 108  CO2 28 28  GLUCOSE 94 136*  BUN 16 14  CREATININE 0.78 0.66  CALCIUM 8.4 8.7   Liver Function Tests: No results found for this basename: AST, ALT, ALKPHOS, BILITOT, PROT, ALBUMIN,  in the last 168 hours No results found for this basename: LIPASE, AMYLASE,  in the last 168 hours No results found for this basename: AMMONIA,  in the last 168 hours CBC:  Recent Labs Lab 05/27/13 1437 05/28/13 0545  WBC 6.6 3.7*  NEUTROABS 4.8  --   HGB 14.1 13.7  HCT 43.4 42.8  MCV 93.7 94.3  PLT 206 235   Cardiac Enzymes: No results found for this basename: CKTOTAL, CKMB, CKMBINDEX, TROPONINI,  in the last 168 hours BNP (last 3 results) No results found for this basename: PROBNP,  in the last 8760 hours CBG: No results found for this basename: GLUCAP,  in the last 168 hours  Micro No results found for this or any previous visit (from the past 240 hour(s)).   Studies: No results found.  Scheduled Meds: . albuterol  2.5 mg Nebulization BID  . antiseptic oral rinse  15 mL  Mouth Rinse BID  . guaiFENesin  1,200 mg Oral BID  . heparin  5,000 Units Subcutaneous Q8H  . levofloxacin (LEVAQUIN) IV  750 mg Intravenous Q24H  . methylPREDNISolone (SOLU-MEDROL) injection  80 mg Intravenous Q8H  . mometasone-formoterol  2 puff Inhalation BID  . oseltamivir  75 mg Oral BID  . pantoprazole  40 mg Oral Daily  . simvastatin  20 mg Oral QHS  . tiotropium  18 mcg Inhalation Q lunch  . traZODone  100 mg Oral QHS   Continuous Infusions: . sodium  chloride 75 mL/hr at 05/28/13 0818       Time spent: 35 minutes    [facility] A  [facility] Pager [phone] If 7PM-7AM, please contact night-coverage at [email or web address], password [facility] 05/28/2013, 3:02 PM  LOS: 1 day

## 2013-05-29 MED ORDER — TEMAZEPAM 15 MG PO CAPS
15.0000 mg | ORAL_CAPSULE | Freq: Every evening | ORAL | Status: DC | PRN
  Administered 2013-05-29: 15 mg via ORAL
  Filled 2013-05-29: qty 1

## 2013-05-29 MED ORDER — CALCIUM CARBONATE ANTACID 500 MG PO CHEW
1.0000 | CHEWABLE_TABLET | Freq: Two times a day (BID) | ORAL | Status: DC
  Administered 2013-05-29 – 2013-05-30 (×2): 200 mg via ORAL
  Filled 2013-05-29 (×4): qty 1

## 2013-05-29 MED ORDER — TEMAZEPAM 15 MG PO CAPS: 15.0000 mg | ORAL_CAPSULE | Freq: Every evening | ORAL | Status: DC | PRN

## 2013-05-29 MED ORDER — DIPHENHYDRAMINE HCL 50 MG/ML IJ SOLN
50.0000 mg | Freq: Every evening | INTRAMUSCULAR | Status: DC | PRN
  Administered 2013-05-29: 50 mg via INTRAVENOUS
  Filled 2013-05-29: qty 1

## 2013-05-29 MED ORDER — METHYLPREDNISOLONE SODIUM SUCC 125 MG IJ SOLR
60.0000 mg | Freq: Two times a day (BID) | INTRAMUSCULAR | Status: DC
  Administered 2013-05-29 – 2013-05-30 (×2): 60 mg via INTRAVENOUS
  Filled 2013-05-29 (×4): qty 0.96

## 2013-05-29 MED ORDER — ZOLPIDEM TARTRATE 5 MG PO TABS
5.0000 mg | ORAL_TABLET | Freq: Every evening | ORAL | Status: DC | PRN
  Administered 2013-05-29: 5 mg via ORAL
  Filled 2013-05-29: qty 1

## 2013-05-29 NOTE — Progress Notes (Signed)
[facility] PROGRESS NOTE  [name] UYQ:[id number] DOB: 21-Jul-1940 DOA: 05/27/2013 PCP: Horatio Pel, MD  HPI/Subjective: Still has some shortness of breath, oxygen saturation was 81% with ambulation.  Assessment/Plan: Principal Problem:   Acute respiratory failure with hypoxia Active Problems:   COPD with chronic bronchitis   SOB (shortness of breath)   Myalgia   COPD with acute exacerbation   Influenza A with respiratory manifestations   Acute hypoxic respiratory failure  -Secondary to COPD exacerbation, patient oxygen saturation 79% on room air with ambulation per physician report.  -Placed on supplemental oxygen. - will recheck O2 sats -We'll treat the COPD exacerbation as the cause.   Influenza type A with respiratory manifestations -Started on Tamiflu, continue for 5 days.  Acute COPD exacerbation  -Chest x-ray (available on PACS) showed no evidence of pneumonia or edema.  -Started on Levaquin, IV Solu-Medrol (wean down) and bronchodilators.  -Supportive management with mucolytics, antitussives and oxygen as needed.  -She still hypoxic and requires 2-3 L of oxygen at rest. Likely she'll be discharged on oxygen.  Myalgia  -due to flu   Code Status: Full code Family Communication: Plan discussed with the patient. Disposition Plan: Remains inpatient   Consultants:  None  Procedures:  None  Antibiotics:  Levaquin  tamiflu   Objective: Filed Vitals:   05/29/13 0555  BP: 164/97  Pulse: 82  Temp: 98 F (36.7 C)  Resp: 18    Intake/Output Summary (Last 24 hours) at 05/29/13 0743 Last data filed at 05/28/13 2300  Gross per 24 hour  Intake   1140 ml  Output      0 ml  Net   1140 ml   Filed Weights   05/27/13 1844  Weight: 74.889 kg (165 lb 1.6 oz)    Exam: General: Alert and awake, oriented x3, not in any acute distress. CVS: S1-S2 clear, no murmur rubs or gallops Chest: coarse breath sounds Abdomen: soft nontender,  nondistended, normal bowel sounds, no organomegaly Extremities: no cyanosis, clubbing or edema noted bilaterally Neuro: Cranial nerves II-XII intact, no focal neurological deficits  Data Reviewed: Basic Metabolic Panel:  Recent Labs Lab 05/27/13 1437 05/28/13 0545  NA 143 147  K 4.0 4.8  CL 102 108  CO2 28 28  GLUCOSE 94 136*  BUN 16 14  CREATININE 0.78 0.66  CALCIUM 8.4 8.7   Liver Function Tests: No results found for this basename: AST, ALT, ALKPHOS, BILITOT, PROT, ALBUMIN,  in the last 168 hours No results found for this basename: LIPASE, AMYLASE,  in the last 168 hours No results found for this basename: AMMONIA,  in the last 168 hours CBC:  Recent Labs Lab 05/27/13 1437 05/28/13 0545  WBC 6.6 3.7*  NEUTROABS 4.8  --   HGB 14.1 13.7  HCT 43.4 42.8  MCV 93.7 94.3  PLT 206 235   Cardiac Enzymes: No results found for this basename: CKTOTAL, CKMB, CKMBINDEX, TROPONINI,  in the last 168 hours BNP (last 3 results) No results found for this basename: PROBNP,  in the last 8760 hours CBG: No results found for this basename: GLUCAP,  in the last 168 hours  Micro No results found for this or any previous visit (from the past 240 hour(s)).   Studies: No results found.  Scheduled Meds: . albuterol  2.5 mg Nebulization BID  . antiseptic oral rinse  15 mL Mouth Rinse BID  . guaiFENesin  1,200 mg Oral BID  . heparin  5,000 Units Subcutaneous Q8H  .  levofloxacin (LEVAQUIN) IV  750 mg Intravenous Q24H  . methylPREDNISolone (SOLU-MEDROL) injection  80 mg Intravenous Q8H  . mometasone-formoterol  2 puff Inhalation BID  . oseltamivir  75 mg Oral BID  . pantoprazole  40 mg Oral Daily  . simvastatin  20 mg Oral QHS  . tiotropium  18 mcg Inhalation Q lunch  . traZODone  100 mg Oral QHS   Continuous Infusions: . sodium chloride 75 mL/hr at 05/28/13 2226       Time spent: 25 minutes    Eulogio Bear  [facility] Pager [phone] If 7PM-7AM, please contact  night-coverage at [email or web address], password [facility] 05/29/2013, 7:43 AM  LOS: 2 days

## 2013-05-30 DIAGNOSIS — J189 Pneumonia, unspecified organism: Secondary | ICD-10-CM

## 2013-05-30 MED ORDER — OSELTAMIVIR PHOSPHATE 75 MG PO CAPS: 75.0000 mg | ORAL_CAPSULE | Freq: Two times a day (BID) | ORAL | Status: DC

## 2013-05-30 MED ORDER — PREDNISONE (PAK) 10 MG PO TABS: ORAL_TABLET | Freq: Every day | ORAL | Status: DC

## 2013-05-30 MED ORDER — LEVOFLOXACIN 750 MG PO TABS: 750.0000 mg | ORAL_TABLET | Freq: Every day | ORAL | Status: DC

## 2013-05-30 NOTE — Plan of Care (Signed)
Problem: Discharge Progression Outcomes Goal: Vaccine documented on D/C instructions Outcome: Not Applicable Date Met:  38/88/28 Patient already had up to date PNA and Flu vaccines prior to this admission.

## 2013-05-30 NOTE — Progress Notes (Signed)
Patient discharged to home. Patient AVS reviewed and prescriptions provided to patient. Patient verbalized understanding of medications and follow-up appointments.  Patient remains stable; no signs or symptoms of distress.  Patient educated to return to the ER in cases of SOB, dizziness, fever, chest pain, or fainting.

## 2013-05-30 NOTE — Discharge Summary (Signed)
Physician Discharge Summary  [name] RCB:[id number] DOB: Feb 15, 1941 DOA: 05/27/2013  PCP: Horatio Pel, MD  Admit date: 05/27/2013 Discharge date: 05/30/2013  Time spent: 35  minutes  Recommendations for Outpatient Follow-up:  1. Cbc, bmp 1 week 2. Re-evaluate BP once off steroids  Discharge Diagnoses:  Principal Problem:   Acute respiratory failure with hypoxia Active Problems:   COPD with chronic bronchitis   SOB (shortness of breath)   Myalgia   COPD with acute exacerbation   Influenza A with respiratory manifestations   Discharge Condition: improved  Diet recommendation: cardiac  Filed Weights   05/27/13 1844  Weight: 74.889 kg (165 lb 1.6 oz)    History of present illness:  [name] is a 73 y.o. female with past medical history of COPD tobacco abuse disorder. Patient came in to the hospital because of shortness of. Apparently patient did have shortness of breath, nonproductive cough since Sunday. Symptoms started to worsen yesterday and she did have pain all over. Today she developed fever so she went to her primary care physician for further evaluation. Chest x-ray was done over there showed no evidence of pneumonia.  In the ED blood work showed no abnormalities, patient had oxygen saturation of 79% on room air with ambulation, she had fever of 100.1   Hospital Course:  Acute hypoxic respiratory failure  -Secondary to COPD exacerbation,  No need for O2- 95% on room air  Influenza type A with respiratory manifestations  - Tamiflu, continue for 5 days.   Acute COPD exacerbation  -Chest x-ray (available on PACS) showed no evidence of pneumonia or edema.  -levaquin Steroids wean  Myalgia  -due to flu   Procedures:  none  Consultations:  none  Discharge Exam: Filed Vitals:   05/30/13 0856  BP: 165/93  Pulse: 80  Temp: 97.9 F (36.6 C)  Resp: 18    General: A+Ox3, NAD- anxious to leave Cardiovascular: rrr Respiratory: clear  anterior  Discharge Instructions      Discharge Orders   Future Appointments Provider Department Dept Phone   09/23/2013 9:00 AM Deneise Lever, MD [facility] [phone]   Future Orders Complete By Expires   Diet - low sodium heart healthy  As directed    Increase activity slowly  As directed        Medication List         albuterol 108 (90 BASE) MCG/ACT inhaler  Commonly known as:  PROVENTIL HFA;VENTOLIN HFA  Inhale 2 puffs into the lungs every 6 (six) hours as needed for wheezing or shortness of breath.     albuterol (2.5 MG/3ML) 0.083% nebulizer solution  Commonly known as:  PROVENTIL  Take 2.5 mg by nebulization every 6 (six) hours as needed for wheezing or shortness of breath.     DIPHENHYDRAMINE-ACETAMINOPHEN PO  Take 2 tablets by mouth at bedtime.     Fluticasone-Salmeterol 250-50 MCG/DOSE Aepb  Commonly known as:  ADVAIR  Inhale 1 puff into the lungs 2 (two) times daily.     levofloxacin 750 MG tablet  Commonly known as:  LEVAQUIN  Take 1 tablet (750 mg total) by mouth daily.     omeprazole 20 MG capsule  Commonly known as:  PRILOSEC  Take 1 tablet by mouth daily.     oseltamivir 75 MG capsule  Commonly known as:  TAMIFLU  Take 1 capsule (75 mg total) by mouth 2 (two) times daily.     predniSONE 10 MG tablet  Commonly known  asArnetha Gula  Take by mouth daily. 40 mg x 2, 30 mg x 2, 20 mg x 2, 10 mg x 2 , 5 mg x 2     simvastatin 20 MG tablet  Commonly known as:  ZOCOR  Take 20 mg by mouth at bedtime.     tiotropium 18 MCG inhalation capsule  Commonly known as:  SPIRIVA  Place 18 mcg into inhaler and inhale daily with lunch.     traZODone 100 MG tablet  Commonly known as:  DESYREL  Take 100 mg by mouth at bedtime.       Allergies  Allergen Reactions  . Clarithromycin Swelling  . Penicillins Swelling    Tongue swelling  . Pneumococcal Vaccines Other (See Comments)    Couldn't breathe  . Sulfonamide Derivatives Swelling    Follow-up Information   Follow up with Horatio Pel, MD In 1 week.   Specialty:  Internal Medicine   Contact information:   [address] [place] [address] [phone]        The results of significant diagnostics from this hospitalization (including imaging, microbiology, ancillary and laboratory) are listed below for reference.    Significant Diagnostic Studies: No results found.  Microbiology: No results found for this or any previous visit (from the past 240 hour(s)).   Labs: Basic Metabolic Panel:  Recent Labs Lab 05/27/13 1437 05/28/13 0545  NA 143 147  K 4.0 4.8  CL 102 108  CO2 28 28  GLUCOSE 94 136*  BUN 16 14  CREATININE 0.78 0.66  CALCIUM 8.4 8.7   Liver Function Tests: No results found for this basename: AST, ALT, ALKPHOS, BILITOT, PROT, ALBUMIN,  in the last 168 hours No results found for this basename: LIPASE, AMYLASE,  in the last 168 hours No results found for this basename: AMMONIA,  in the last 168 hours CBC:  Recent Labs Lab 05/27/13 1437 05/28/13 0545  WBC 6.6 3.7*  NEUTROABS 4.8  --   HGB 14.1 13.7  HCT 43.4 42.8  MCV 93.7 94.3  PLT 206 235   Cardiac Enzymes: No results found for this basename: CKTOTAL, CKMB, CKMBINDEX, TROPONINI,  in the last 168 hours BNP: BNP (last 3 results) No results found for this basename: PROBNP,  in the last 8760 hours CBG: No results found for this basename: GLUCAP,  in the last 168 hours     Signed:  Eulogio Bear  [facility] 05/30/2013, 3:39 PM

## 2013-05-30 NOTE — Progress Notes (Signed)
Patient wanting to go home. Ambulating in room, up to bathroom. Oxygen level 95% on room air. Did not require any oxygen all night.

## 2013-06-18 ENCOUNTER — Ambulatory Visit (INDEPENDENT_AMBULATORY_CARE_PROVIDER_SITE_OTHER): Payer: Medicare Other | Admitting: Internal Medicine

## 2013-06-18 ENCOUNTER — Encounter: Payer: Self-pay | Admitting: Internal Medicine

## 2013-06-18 VITALS — BP 122/80 | HR 98 | Temp 98.0°F | Ht 67.0 in | Wt 165.0 lb

## 2013-06-18 DIAGNOSIS — J9601 Acute respiratory failure with hypoxia: Secondary | ICD-10-CM

## 2013-06-18 DIAGNOSIS — J9600 Acute respiratory failure, unspecified whether with hypoxia or hypercapnia: Secondary | ICD-10-CM

## 2013-06-18 DIAGNOSIS — J449 Chronic obstructive pulmonary disease, unspecified: Secondary | ICD-10-CM

## 2013-06-18 NOTE — Patient Instructions (Signed)
Ok to finish the meds as directed   Order- DME ONOX on room air    dx COPD

## 2013-06-18 NOTE — Progress Notes (Signed)
Patient ID: [name], female    DOB: [birth year], 73 y.o.   MRN: [id number]  HPI 73 yo former smoker followed for COPD and allergic rhinitis, complicated by GERD and chronic back pain.. I last saw her September 15, 2009 with pollen rhinitis then. Notes reviewed. She says she has done well all winter. Now reports 4 days of head congestion, ears and nose stopped up, watery eyes, postnasal drip. Minor wheeze, but chest not bad yet. She blames pollen. Using sudafed and Neti pot without relief.   09/21/11- 59 yo former smoker followed for COPD and allergic rhinitis, complicated by GERD and chronic back pain. PCP Dr Shelia Media She paces herself for shopping and with other activities. No routine cough. Asks refill Tylenol No. 3 keep on hand for her chronic back pain and also because of sometimes blunts dyspnea. I agreed this time but told her going forward she should get it from her primary physician.. He had just done chest x-ray as part of her regular physical exam and told her "okay". We reviewed her medications. At next visit we will discuss update PFT.  09/20/12- 23 yo former smoker followed for COPD and allergic rhinitis, complicated by GERD and chronic back pain PCP Dr Shelia Media. FOLLOWS FOR: Dr Shelia Media gave sample of Caprice Renshaw and feels this is helping her breathing-did not get a Rx. Occasional bronchitis with no major flareups. Sinus pressure, blowing and sneezing blamed on pollen. She expects general physical exam with Dr.Pharr next week.   06/18/13- 89 yo former smoker followed for COPD and allergic rhinitis, complicated by GERD and chronic back pain PCP Dr Shelia Media. Hosp 12/30-1/2 w/ influenza A, AECOPD Presents for possible PNA. Reports SOB, chest tightness and slight wheezing. Denies coughing. Has been using Mucinex with little relief. Afebrile today.  Hospitalized with influenza A/ acute respiratory failure with hypoxia/ acute COPD exacerbation-       12/30-05/30/13. followup chest x-ray at Dr Pennie Banter-  told residual pneumonia. CXR 09/20/12 IMPRESSION:  No acute cardiopulmonary abnormality seen.  Original Report Authenticated By: Marijo Conception., M.D.  ROS-see HPI Constitutional:   No-   weight loss, night sweats, fevers, chills, fatigue, lassitude. HEENT:   No-  headaches, difficulty swallowing, tooth/dental problems, sore throat,       No- sneezing, itching, ear ache,nasal congestion, post nasal drip,  CV:  No-   chest pain, orthopnea, PND, swelling in lower extremities, anasarca, dizziness, palpitations Resp: + shortness of breath with exertion or at rest.              No-   productive cough,  No non-productive cough,  No- coughing up of blood.              No-   change in color of mucus.  +occasional mild wheezing.   Skin: No-   rash or lesions. GI:  No-   heartburn, indigestion, abdominal pain, nausea, vomiting,  GU:  MS:  No-   joint pain or swelling. +chronic back pain Neuro-     nothing unusual Psych:  No- change in mood or affect. No depression or anxiety.  No memory loss.  OBJ- Physical Exam General- Alert, Oriented, Affect-appropriate, Distress- none acute Skin- rash-none, lesions- none, excoriation- none Lymphadenopathy- none Head- atraumatic            Eyes- Gross vision intact, PERRLA, conjunctivae and secretions clear            Ears- Hearing, canals-normal  Nose- Clear, no-Septal dev, mucus, polyps, erosion, perforation             Throat- Mallampati II , mucosa clear , drainage- none, tonsils- atrophic Neck- flexible , trachea midline, no stridor , thyroid nl, carotid no bruit Chest - symmetrical excursion , unlabored           Heart/CV- RRR , no murmur , no gallop  , no rub, nl s1 s2                           - JVD- none , edema- none, stasis changes- none, varices- none           Lung-  +distant, cough- none, wheeze-none , dullness-none, rub- none           Chest wall-  Abd-  Br/ Gen/ Rectal- Not done, not indicated Extrem- cyanosis- none, clubbing,  none, atrophy- none, strength- nl Neuro- restless

## 2013-06-20 ENCOUNTER — Telehealth: Payer: Self-pay | Admitting: Internal Medicine

## 2013-06-20 NOTE — Telephone Encounter (Signed)
Spoke with the pt She states that someone contacted her today and ONO was scheduled  Nothing further needed

## 2013-06-23 ENCOUNTER — Telehealth: Payer: Self-pay | Admitting: Internal Medicine

## 2013-06-23 DIAGNOSIS — J449 Chronic obstructive pulmonary disease, unspecified: Secondary | ICD-10-CM

## 2013-06-23 NOTE — Telephone Encounter (Signed)
Called and spoke with mandy from [place] and she wanted to see if [facility] will be ordering the nighttime O2 for the pt?  CY please advise. Thanks  Last ov--06/18/2013 Next ov--12/16/2013  Allergies  Allergen Reactions  . Clarithromycin Swelling  . Penicillins Swelling    Tongue swelling  . Pneumococcal Vaccines Other (See Comments)    Couldn't breathe  . Sulfonamide Derivatives Swelling     Current Outpatient Prescriptions on File Prior to Visit  Medication Sig Dispense Refill  . albuterol (PROVENTIL HFA;VENTOLIN HFA) 108 (90 BASE) MCG/ACT inhaler Inhale 2 puffs into the lungs every 6 (six) hours as needed for wheezing or shortness of breath.      Marland Kitchen albuterol (PROVENTIL) (2.5 MG/3ML) 0.083% nebulizer solution Take 2.5 mg by nebulization every 6 (six) hours as needed for wheezing or shortness of breath.      . budesonide-formoterol (SYMBICORT) 160-4.5 MCG/ACT inhaler Inhale 2 puffs into the lungs 2 (two) times daily.      Marland Kitchen LORazepam (ATIVAN) 1 MG tablet Take 0.5-1 mg by mouth at bedtime.      Marland Kitchen omeprazole (PRILOSEC) 20 MG capsule Take 1 tablet by mouth daily.      . simvastatin (ZOCOR) 20 MG tablet Take 20 mg by mouth at bedtime.        Marland Kitchen tiotropium (SPIRIVA) 18 MCG inhalation capsule Place 18 mcg into inhaler and inhale daily with lunch.       . traZODone (DESYREL) 100 MG tablet Take 100 mg by mouth at bedtime.        No current facility-administered medications on file prior to visit.

## 2013-06-24 ENCOUNTER — Telehealth: Payer: Self-pay | Admitting: Internal Medicine

## 2013-06-24 NOTE — Telephone Encounter (Signed)
Pt aware and order p;aced to [facility].[place] Bing, CMA

## 2013-06-24 NOTE — Telephone Encounter (Signed)
Based on [place] 06/23/13- her oxygen saturation was below the safe level almost all night. She definitely needs to be wearing oxygen while she sleeps, to protect her brain and heart. Order DME ([facility]?) O2 2l for sleep. Dx COPD

## 2013-06-24 NOTE — Telephone Encounter (Signed)
Pt was wondering what her O2 level was during ONO. Advised her that her O2 level was less than 88% for 504 minutes. [facility] has contacted her about setting up O2. Nothing further was needed at this time.

## 2013-07-15 NOTE — Assessment & Plan Note (Signed)
Near baseline

## 2013-07-15 NOTE — Assessment & Plan Note (Signed)
Associated with COPD exacerbation /influenza A.now back to baseline She does not want  Oxygen but agrees to reassessment Plan-overnight oximetry

## 2013-09-23 ENCOUNTER — Ambulatory Visit: Payer: Medicare Other | Admitting: Internal Medicine

## 2013-12-16 ENCOUNTER — Encounter: Payer: Self-pay | Admitting: Internal Medicine

## 2013-12-16 ENCOUNTER — Ambulatory Visit (INDEPENDENT_AMBULATORY_CARE_PROVIDER_SITE_OTHER): Payer: Medicare Other | Admitting: Internal Medicine

## 2013-12-16 VITALS — BP 122/80 | HR 82 | Ht 67.0 in | Wt 167.0 lb

## 2013-12-16 DIAGNOSIS — J441 Chronic obstructive pulmonary disease with (acute) exacerbation: Secondary | ICD-10-CM

## 2013-12-16 DIAGNOSIS — K219 Gastro-esophageal reflux disease without esophagitis: Secondary | ICD-10-CM

## 2013-12-16 DIAGNOSIS — J449 Chronic obstructive pulmonary disease, unspecified: Secondary | ICD-10-CM

## 2013-12-16 MED ORDER — TIOTROPIUM BROMIDE MONOHYDRATE 18 MCG IN CAPS: 18.0000 ug | ORAL_CAPSULE | Freq: Every day | RESPIRATORY_TRACT | Status: DC

## 2013-12-16 MED ORDER — FLUTICASONE-SALMETEROL 250-50 MCG/DOSE IN AEPB: 1.0000 | INHALATION_SPRAY | Freq: Two times a day (BID) | RESPIRATORY_TRACT | Status: DC

## 2013-12-16 NOTE — Assessment & Plan Note (Signed)
Severe COPD. She is oxygenating better at this time and not using O2 she is paying for. Plan- D/C O2, continue present meds.

## 2013-12-16 NOTE — Addendum Note (Signed)
Addended by: Carlos American A on: 12/16/2013 02:29 PM   Modules accepted: Orders

## 2013-12-16 NOTE — Patient Instructions (Addendum)
Order- DME [facility] d/c home O2   Sample Advair 250 and Spiriva handihaler if available  Office spirometry   Dx COPD without exacerbation

## 2013-12-16 NOTE — Progress Notes (Signed)
Patient ID: [name], female    DOB: 09-04-1940, 73 y.o.   MRN: [id number]  HPI 73 yo former smoker followed for COPD and allergic rhinitis, complicated by GERD and chronic back pain.. I last saw her September 15, 2009 with pollen rhinitis then. Notes reviewed. She says she has done well all winter. Now reports 4 days of head congestion, ears and nose stopped up, watery eyes, postnasal drip. Minor wheeze, but chest not bad yet. She blames pollen. Using sudafed and Neti pot without relief.   09/21/11- 24 yo former smoker followed for COPD and allergic rhinitis, complicated by GERD and chronic back pain. PCP Dr Shelia Media She paces herself for shopping and with other activities. No routine cough. Asks refill Tylenol No. 3 keep on hand for her chronic back pain and also because of sometimes blunts dyspnea. I agreed this time but told her going forward she should get it from her primary physician.. He had just done chest x-ray as part of her regular physical exam and told her "okay". We reviewed her medications. At next visit we will discuss update PFT.  09/20/12- 7 yo former smoker followed for COPD and allergic rhinitis, complicated by GERD and chronic back pain PCP Dr Shelia Media. FOLLOWS FOR: Dr Shelia Media gave sample of Caprice Renshaw and feels this is helping her breathing-did not get a Rx. Occasional bronchitis with no major flareups. Sinus pressure, blowing and sneezing blamed on pollen. She expects general physical exam with Dr.Pharr next week.   06/18/13- 41 yo former smoker followed for COPD and allergic rhinitis, complicated by GERD and chronic back pain PCP Dr Shelia Media. Hosp 12/30-1/2 w/ influenza A, AECOPD Presents for possible PNA. Reports SOB, chest tightness and slight wheezing. Denies coughing. Has been using Mucinex with little relief. Afebrile today.  Hospitalized with influenza A/ acute respiratory failure with hypoxia/ acute COPD exacerbation-       12/30-05/30/13. followup chest x-ray at Dr Pennie Banter-  told residual pneumonia. CXR 09/20/12 IMPRESSION:  No acute cardiopulmonary abnormality seen.  Original Report Authenticated By: Marijo Conception., M.D.  12/16/13- 29 yo former smoker followed for COPD and allergic rhinitis, complicated by GERD and chronic back pain PCP Dr Shelia Media. ONOX in Jan, 2015 had qualified for sleep O2 after hosp stay for AECOPD/ acute respiratory failure. FOLLOWS FOR:  Breathing doing well no concerns today.  Request order placed with [facility] for o2 to be picked up states does not use Feels well without significant cough or acute issues.  She reports CXR s have been done by Dr Shelia Media with her physical and remain "ok". Office Spirometry 12/16/13- Severe obstructive airways disease, FVC 2.13/ 65%, FEV1 1.08/ 44%, FEV1/FVC 0.51, FEF25-75% 0.47/ 22%.  ROS-see HPI Constitutional:   No-   weight loss, night sweats, fevers, chills, fatigue, lassitude. HEENT:   No-  headaches, difficulty swallowing, tooth/dental problems, sore throat,       No- sneezing, itching, ear ache,nasal congestion, post nasal drip,  CV:  No-   chest pain, orthopnea, PND, swelling in lower extremities, anasarca, dizziness, palpitations Resp: + shortness of breath with exertion or at rest.              No-   productive cough,  No non-productive cough,  No- coughing up of blood.              No-   change in color of mucus.  +occasional mild wheezing.   Skin: No-   rash or lesions. GI:  No-  heartburn, indigestion, abdominal pain, nausea, vomiting,  GU:  MS:  No-   joint pain or swelling. +chronic back pain Neuro-     nothing unusual Psych:  No- change in mood or affect. No depression or anxiety.  No memory loss.  OBJ- Physical Exam General- Alert, Oriented, Affect-appropriate, Distress- none acute Skin- rash-none, lesions- none, excoriation- none Lymphadenopathy- none Head- atraumatic            Eyes- Gross vision intact, PERRLA, conjunctivae and secretions clear            Ears- Hearing,  canals-normal            Nose- Clear, no-Septal dev, mucus, polyps, erosion, perforation             Throat- Mallampati II , mucosa clear , drainage- none, tonsils- atrophic Neck- flexible , trachea midline, no stridor , thyroid nl, carotid no bruit Chest - symmetrical excursion , unlabored           Heart/CV- RRR , no murmur , no gallop  , no rub, nl s1 s2                           - JVD- none , edema- none, stasis changes- none, varices- none           Lung-  +distant, cough- none, wheeze-none , dullness-none, rub- none           Chest wall-  Abd-  Br/ Gen/ Rectal- Not done, not indicated Extrem- cyanosis- none, clubbing, none, atrophy- none, strength- nl Neuro- restless

## 2013-12-16 NOTE — Assessment & Plan Note (Signed)
Fair control.

## 2013-12-19 ENCOUNTER — Telehealth: Payer: Self-pay | Admitting: Internal Medicine

## 2013-12-19 NOTE — Telephone Encounter (Signed)
Spoke with Leafy Ro at [facility] is aware to pick up patients O2 and nothing more needed. She will contact patient to arrange pick up date.

## 2013-12-19 NOTE — Telephone Encounter (Signed)
We sent order for this on 12/16/13 to [facility]. Called pt and he reports [facility] told her we told them she is too keep her O2. I do not see anything documented in EPIC.  ---  Called spoke with [facility]. She reports she spoke with Joellen Jersey and they were to call her and let her know she still needed the O2. She reports CDY advised katie to cal the pt to make aware.  Please advise Joellen Jersey thanks

## 2013-12-19 NOTE — Telephone Encounter (Signed)
Pt is asking to speak w/ Katie.  Pt states that [facility] is refusing to pick up the O2.  Satira Anis

## 2013-12-19 NOTE — Telephone Encounter (Signed)
I spoke with patient-she is aware that she does not have to keep her O2 per CY; no further testing needed as well as I will call [facility] myself. I called and was told they are in a meeting. Will call back shortly.

## 2013-12-30 ENCOUNTER — Encounter: Payer: Self-pay | Admitting: Internal Medicine

## 2014-01-08 ENCOUNTER — Other Ambulatory Visit: Payer: Self-pay | Admitting: Internal Medicine

## 2014-06-19 ENCOUNTER — Ambulatory Visit: Payer: Medicare Other | Admitting: Internal Medicine

## 2014-06-22 ENCOUNTER — Ambulatory Visit (INDEPENDENT_AMBULATORY_CARE_PROVIDER_SITE_OTHER): Payer: Medicare Other | Admitting: Internal Medicine

## 2014-06-22 ENCOUNTER — Encounter: Payer: Self-pay | Admitting: Internal Medicine

## 2014-06-22 ENCOUNTER — Ambulatory Visit (INDEPENDENT_AMBULATORY_CARE_PROVIDER_SITE_OTHER)
Admission: RE | Admit: 2014-06-22 | Discharge: 2014-06-22 | Disposition: A | Discharge status: Home / Self Care | Payer: Medicare Other | Source: Ambulatory Visit | Attending: Internal Medicine | Admitting: Internal Medicine

## 2014-06-22 VITALS — BP 114/76 | HR 78 | Ht 67.0 in | Wt 172.2 lb

## 2014-06-22 DIAGNOSIS — J449 Chronic obstructive pulmonary disease, unspecified: Secondary | ICD-10-CM

## 2014-06-22 DIAGNOSIS — J441 Chronic obstructive pulmonary disease with (acute) exacerbation: Secondary | ICD-10-CM

## 2014-06-22 NOTE — Assessment & Plan Note (Signed)
Mostly emphysema by symptoms currently, no recent acute bronchitis. She feels stable and is satisfied with current meds. Plan- CXR

## 2014-06-22 NOTE — Patient Instructions (Signed)
Order- CXR dx COPD/ emphysema  Current meds seem ok  Please cal if we can help

## 2014-06-22 NOTE — Progress Notes (Signed)
Patient ID: ANJELA CASSARA, female    DOB: 1941-05-28, 74 y.o.   MRN: [id number]  HPI 74 yo former smoker followed for COPD and allergic rhinitis, complicated by GERD and chronic back pain.. I last saw her September 15, 2009 with pollen rhinitis then. Notes reviewed. She says she has done well all winter. Now reports 4 days of head congestion, ears and nose stopped up, watery eyes, postnasal drip. Minor wheeze, but chest not bad yet. She blames pollen. Using sudafed and Neti pot without relief.   09/21/11- 74 yo former smoker followed for COPD and allergic rhinitis, complicated by GERD and chronic back pain. PCP Dr Shelia Media She paces herself for shopping and with other activities. No routine cough. Asks refill Tylenol No. 3 keep on hand for her chronic back pain and also because of sometimes blunts dyspnea. I agreed this time but told her going forward she should get it from her primary physician.. He had just done chest x-ray as part of her regular physical exam and told her "okay". We reviewed her medications. At next visit we will discuss update PFT.  09/20/12- 74 yo former smoker followed for COPD and allergic rhinitis, complicated by GERD and chronic back pain PCP Dr Shelia Media. FOLLOWS FOR: Dr Shelia Media gave sample of Caprice Renshaw and feels this is helping her breathing-did not get a Rx. Occasional bronchitis with no major flareups. Sinus pressure, blowing and sneezing blamed on pollen. She expects general physical exam with Dr.Pharr next week.   06/18/13- 74 yo former smoker followed for COPD and allergic rhinitis, complicated by GERD and chronic back pain PCP Dr Shelia Media. Hosp 12/30-1/2 w/ influenza A, AECOPD Presents for possible PNA. Reports SOB, chest tightness and slight wheezing. Denies coughing. Has been using Mucinex with little relief. Afebrile today.  Hospitalized with influenza A/ acute respiratory failure with hypoxia/ acute COPD exacerbation-       12/30-05/30/13. followup chest x-ray at Dr Pennie Banter-  told residual pneumonia. CXR 09/20/12 IMPRESSION:  No acute cardiopulmonary abnormality seen.  Original Report Authenticated By: Marijo Conception., M.D.  12/16/13- 74 yo former smoker followed for COPD and allergic rhinitis, complicated by GERD and chronic back pain PCP Dr Shelia Media. ONOX in Jan, 2015 had qualified for sleep O2 after hosp stay for AECOPD/ acute respiratory failure. FOLLOWS FOR:  Breathing doing well no concerns today.  Request order placed with [facility] for o2 to be picked up states does not use Feels well without significant cough or acute issues.  She reports CXR s have been done by Dr Shelia Media with her physical and remain "ok". Office Spirometry 12/16/13- Severe obstructive airways disease, FVC 2.13/ 65%, FEV1 1.08/ 44%, FEV1/FVC 0.51, FEF25-75% 0.47/ 22%.  06/22/14- 74 yo former smoker followed for COPD and allergic rhinitis, complicated by GERD and chronic back pain PCP Dr Shelia Media. FOLLOWS FOR: Pt states her breathing is doing well and feeling good overall. No recent acute illness.  Reviewed hx of rapid onset systemic reaction to pneumovax in past.  ROS-see HPI Constitutional:   No-   weight loss, night sweats, fevers, chills, fatigue, lassitude. HEENT:   No-  headaches, difficulty swallowing, tooth/dental problems, sore throat,       No- sneezing, itching, ear ache,nasal congestion, post nasal drip,  CV:  No-   chest pain, orthopnea, PND, swelling in lower extremities, anasarca, dizziness, palpitations Resp: + shortness of breath with exertion or at rest.              No-  productive cough,  + non-productive cough,  No- coughing up of blood.              No-   change in color of mucus.  +occasional mild wheezing.   Skin: No-   rash or lesions. GI:  No-   heartburn, indigestion, abdominal pain, nausea, vomiting,  GU:  MS:  No-   joint pain or swelling. +chronic back pain Neuro-     nothing unusual Psych:  No- change in mood or affect. No depression or anxiety.  No memory  loss.  OBJ- Physical Exam General- Alert, Oriented, Affect-appropriate, Distress- none acute Skin- rash-none, lesions- none, excoriation- none Lymphadenopathy- none Head- atraumatic            Eyes- Gross vision intact, PERRLA, conjunctivae and secretions clear            Ears- Hearing, canals-normal            Nose- Clear, no-Septal dev, mucus, polyps, erosion, perforation             Throat- Mallampati II , mucosa clear , drainage- none, tonsils- atrophic Neck- flexible , trachea midline, no stridor , thyroid nl, carotid no bruit Chest - symmetrical excursion , unlabored           Heart/CV- RRR , no murmur , no gallop  , no rub, nl s1 s2                           - JVD- none , edema- none, stasis changes- none, varices- none           Lung-  +distant, cough- none, wheeze-none , dullness-none, rub- none           Chest wall-  Abd-  Br/ Gen/ Rectal- Not done, not indicated Extrem- cyanosis- none, clubbing, none, atrophy- none, strength- nl Neuro- restless

## 2014-07-23 ENCOUNTER — Other Ambulatory Visit: Payer: Self-pay | Admitting: Internal Medicine

## 2014-08-25 ENCOUNTER — Other Ambulatory Visit: Payer: Self-pay | Admitting: Internal Medicine

## 2014-08-25 DIAGNOSIS — R911 Solitary pulmonary nodule: Secondary | ICD-10-CM

## 2014-08-27 ENCOUNTER — Other Ambulatory Visit: Payer: Self-pay | Admitting: Internal Medicine

## 2014-08-27 ENCOUNTER — Ambulatory Visit
Admission: RE | Admit: 2014-08-27 | Discharge: 2014-08-27 | Disposition: A | Discharge status: Home / Self Care | Payer: Medicare Other | Source: Ambulatory Visit | Attending: Internal Medicine | Admitting: Internal Medicine

## 2014-08-27 DIAGNOSIS — R911 Solitary pulmonary nodule: Secondary | ICD-10-CM

## 2014-08-27 NOTE — Telephone Encounter (Signed)
Patient has not had Levalbuterol since 2013. She has requested to have it refilled, she is currently on albuterol. Please advise.

## 2014-08-27 NOTE — Telephone Encounter (Signed)
Ok to Rx xopenex neb solution as requested for use every 6 hours if needed, refill x 1 year

## 2014-08-27 NOTE — Telephone Encounter (Signed)
Patient last seen in Jan 2016.She has a follow in July 2016.

## 2014-09-24 ENCOUNTER — Telehealth: Payer: Self-pay | Admitting: Internal Medicine

## 2014-09-24 MED ORDER — ALBUTEROL SULFATE (2.5 MG/3ML) 0.083% IN NEBU: 2.5000 mg | INHALATION_SOLUTION | Freq: Four times a day (QID) | RESPIRATORY_TRACT | Status: DC

## 2014-09-24 NOTE — Telephone Encounter (Signed)
Spoke with pt and she is requesting rx for Albuterol neb be sent to pharmacy with dx code on it.  She states her insurance will not approve Xopenex.  Rx sent to [facility] on [address] per pt request.

## 2014-10-21 ENCOUNTER — Telehealth: Payer: Self-pay | Admitting: Internal Medicine

## 2014-10-21 NOTE — Telephone Encounter (Signed)
Made in error

## 2014-10-21 NOTE — Telephone Encounter (Signed)
Folder with disc and message on CY's cart to review and advise. Thanks.

## 2014-10-22 NOTE — Telephone Encounter (Signed)
CT disk and interp sent showing just scaring. Report is already in system.

## 2014-10-23 NOTE — Telephone Encounter (Signed)
Called and spoke to pt. Informed her of the results per CY. Pt verbalized understanding and denied any further questions or concerns at this time.

## 2014-12-21 ENCOUNTER — Encounter: Payer: Self-pay | Admitting: Internal Medicine

## 2014-12-21 ENCOUNTER — Ambulatory Visit (INDEPENDENT_AMBULATORY_CARE_PROVIDER_SITE_OTHER): Payer: Medicare Other | Admitting: Internal Medicine

## 2014-12-21 VITALS — BP 114/66 | HR 82 | Ht 67.0 in | Wt 169.2 lb

## 2014-12-21 DIAGNOSIS — J449 Chronic obstructive pulmonary disease, unspecified: Secondary | ICD-10-CM

## 2014-12-21 MED ORDER — CEFDINIR 300 MG PO CAPS: 300.0000 mg | ORAL_CAPSULE | Freq: Two times a day (BID) | ORAL | Status: DC

## 2014-12-21 NOTE — Progress Notes (Signed)
Patient ID: [name], female    DOB: 03-12-1941, 74 y.o.   MRN: [id number]  HPI 74 yo former smoker followed for COPD and allergic rhinitis, complicated by GERD and chronic back pain.. I last saw her September 15, 2009 with pollen rhinitis then. Notes reviewed. She says she has done well all winter. Now reports 4 days of head congestion, ears and nose stopped up, watery eyes, postnasal drip. Minor wheeze, but chest not bad yet. She blames pollen. Using sudafed and Neti pot without relief.   09/21/11- 83 yo former smoker followed for COPD and allergic rhinitis, complicated by GERD and chronic back pain. PCP Dr Shelia Media She paces herself for shopping and with other activities. No routine cough. Asks refill Tylenol No. 3 keep on hand for her chronic back pain and also because of sometimes blunts dyspnea. I agreed this time but told her going forward she should get it from her primary physician.. He had just done chest x-ray as part of her regular physical exam and told her "okay". We reviewed her medications. At next visit we will discuss update PFT.  09/20/12- 89 yo former smoker followed for COPD and allergic rhinitis, complicated by GERD and chronic back pain PCP Dr Shelia Media. FOLLOWS FOR: Dr Shelia Media gave sample of Caprice Renshaw and feels this is helping her breathing-did not get a Rx. Occasional bronchitis with no major flareups. Sinus pressure, blowing and sneezing blamed on pollen. She expects general physical exam with Dr.Pharr next week.   06/18/13- 89 yo former smoker followed for COPD and allergic rhinitis, complicated by GERD and chronic back pain PCP Dr Shelia Media. Hosp 12/30-1/2 w/ influenza A, AECOPD Presents for possible PNA. Reports SOB, chest tightness and slight wheezing. Denies coughing. Has been using Mucinex with little relief. Afebrile today.  Hospitalized with influenza A/ acute respiratory failure with hypoxia/ acute COPD exacerbation-       12/30-05/30/13. followup chest x-ray at Dr Pennie Banter-  told residual pneumonia. CXR 09/20/12 IMPRESSION:  No acute cardiopulmonary abnormality seen.  Original Report Authenticated By: Marijo Conception., M.D.  12/16/13- 48 yo former smoker followed for COPD and allergic rhinitis, complicated by GERD and chronic back pain PCP Dr Shelia Media. ONOX in Jan, 2015 had qualified for sleep O2 after hosp stay for AECOPD/ acute respiratory failure. FOLLOWS FOR:  Breathing doing well no concerns today.  Request order placed with [employer] for o2 to be picked up states does not use Feels well without significant cough or acute issues.  She reports CXR s have been done by Dr Shelia Media with her physical and remain "ok". Office Spirometry 12/16/13- Severe obstructive airways disease, FVC 2.13/ 65%, FEV1 1.08/ 44%, FEV1/FVC 0.51, FEF25-75% 0.47/ 22%.  06/22/14- 58 yo former smoker followed for COPD and allergic rhinitis, complicated by GERD and chronic back pain PCP Dr Shelia Media. FOLLOWS FOR: Pt states her breathing is doing well and feeling good overall. No recent acute illness.  Reviewed hx of rapid onset systemic reaction to pneumovax in past.  12/21/14- 34 yo former smoker followed for COPD and allergic rhinitis, complicated by GERD and chronic back pain PCP Dr Shelia Media. FOLLOWS FOR: Pt states her breathing is fine but has cough and sinus issues(went to [facility] July 10,2016 and came back with sinus infection-took Zpak).  CT chest 3/32/16 IMPRESSION: Scarring in the lingula corresponds to the density seen on chest x-ray. Right middle lobe scarring also noted. No suspicious pulmonary nodules. Mild emphysema. Coronary artery disease. Electronically Signed  By: Rolm Baptise M.D.  On: 08/28/2014 08:14   ROS-see HPI Constitutional:   No-   weight loss, night sweats, fevers, chills, fatigue, lassitude. HEENT:   No-  headaches, difficulty swallowing, tooth/dental problems, sore throat,       No- sneezing, itching, ear ache,nasal congestion, post nasal drip,  CV:  No-   chest  pain, orthopnea, PND, swelling in lower extremities, anasarca, dizziness, palpitations Resp: + shortness of breath with exertion or at rest.              No-   productive cough,  + non-productive cough,  No- coughing up of blood.              No-   change in color of mucus.  +occasional mild wheezing.   Skin: No-   rash or lesions. GI:  No-   heartburn, indigestion, abdominal pain, nausea, vomiting,  GU:  MS:  No-   joint pain or swelling. +chronic back pain Neuro-     nothing unusual Psych:  No- change in mood or affect. No depression or anxiety.  No memory loss.  OBJ- Physical Exam General- Alert, Oriented, Affect-appropriate, Distress- none acute Skin- rash-none, lesions- none, excoriation- none Lymphadenopathy- none Head- atraumatic            Eyes- Gross vision intact, PERRLA, conjunctivae and secretions clear            Ears- Hearing, canals-normal            Nose- Clear, no-Septal dev, mucus, polyps, erosion, perforation             Throat- Mallampati II , mucosa clear , drainage- none, tonsils- atrophic Neck- flexible , trachea midline, no stridor , thyroid nl, carotid no bruit Chest - symmetrical excursion , unlabored           Heart/CV- RRR , no murmur , no gallop  , no rub, nl s1 s2                           - JVD- none , edema- none, stasis changes- none, varices- none           Lung-  +distant, cough- none, wheeze-none , dullness-none, rub- none           Chest wall-  Abd-  Br/ Gen/ Rectal- Not done, not indicated Extrem- cyanosis- none, clubbing, none, atrophy- none, strength- nl Neuro- restless

## 2014-12-21 NOTE — Patient Instructions (Signed)
script sent for cefdinir antibiotic  Consider taking a probiotic with this- like Clinical cytogeneticist

## 2015-04-28 ENCOUNTER — Other Ambulatory Visit: Payer: Self-pay | Admitting: Internal Medicine

## 2015-06-04 ENCOUNTER — Other Ambulatory Visit: Payer: Self-pay | Admitting: Internal Medicine

## 2015-06-04 MED ORDER — FLUTICASONE-SALMETEROL 250-50 MCG/DOSE IN AEPB: INHALATION_SPRAY | RESPIRATORY_TRACT | Status: DC

## 2015-06-18 DIAGNOSIS — M25561 Pain in right knee: Secondary | ICD-10-CM | POA: Diagnosis not present

## 2015-06-23 ENCOUNTER — Encounter: Payer: Self-pay | Admitting: Internal Medicine

## 2015-06-23 ENCOUNTER — Ambulatory Visit (INDEPENDENT_AMBULATORY_CARE_PROVIDER_SITE_OTHER): Payer: PPO | Admitting: Internal Medicine

## 2015-06-23 VITALS — BP 126/88 | HR 83 | Ht 66.0 in | Wt 177.8 lb

## 2015-06-23 DIAGNOSIS — J9601 Acute respiratory failure with hypoxia: Secondary | ICD-10-CM

## 2015-06-23 DIAGNOSIS — J449 Chronic obstructive pulmonary disease, unspecified: Secondary | ICD-10-CM

## 2015-06-23 NOTE — Assessment & Plan Note (Signed)
She stopped using oxygen and it was taken back [employer]

## 2015-06-23 NOTE — Assessment & Plan Note (Signed)
Adequately controlled. She adjusts her bronchodilator regimen as instructed for mild respiratory viral infections, weather changes etc. Denies need for medication refill at this visit

## 2015-06-23 NOTE — Progress Notes (Signed)
Patient ID: [name], female    DOB: July 17, 1940, 75 y.o.   MRN: [id number]  HPI 75 yo former smoker followed for COPD and allergic rhinitis, complicated by GERD and chronic back pain.. I last saw her September 15, 2009 with pollen rhinitis then. Notes reviewed. She says she has done well all winter. Now reports 4 days of head congestion, ears and nose stopped up, watery eyes, postnasal drip. Minor wheeze, but chest not bad yet. She blames pollen. Using sudafed and Neti pot without relief.   09/21/11- 46 yo former smoker followed for COPD and allergic rhinitis, complicated by GERD and chronic back pain. PCP Dr Shelia Media She paces herself for shopping and with other activities. No routine cough. Asks refill Tylenol No. 3 keep on hand for her chronic back pain and also because of sometimes blunts dyspnea. I agreed this time but told her going forward she should get it from her primary physician.. He had just done chest x-ray as part of her regular physical exam and told her "okay". We reviewed her medications. At next visit we will discuss update PFT.  09/20/12- 71 yo former smoker followed for COPD and allergic rhinitis, complicated by GERD and chronic back pain PCP Dr Shelia Media. FOLLOWS FOR: Dr Shelia Media gave sample of Caprice Renshaw and feels this is helping her breathing-did not get a Rx. Occasional bronchitis with no major flareups. Sinus pressure, blowing and sneezing blamed on pollen. She expects general physical exam with Dr.Pharr next week.   06/18/13- 6 yo former smoker followed for COPD and allergic rhinitis, complicated by GERD and chronic back pain PCP Dr Shelia Media. Hosp 12/30-1/2 w/ influenza A, AECOPD Presents for possible PNA. Reports SOB, chest tightness and slight wheezing. Denies coughing. Has been using Mucinex with little relief. Afebrile today.  Hospitalized with influenza A/ acute respiratory failure with hypoxia/ acute COPD exacerbation-       12/30-05/30/13. followup chest x-ray at Dr Pennie Banter-  told residual pneumonia. CXR 09/20/12 IMPRESSION:  No acute cardiopulmonary abnormality seen.  Original Report Authenticated By: Marijo Conception., M.D.  12/16/13- 43 yo former smoker followed for COPD and allergic rhinitis, complicated by GERD and chronic back pain PCP Dr Shelia Media. ONOX in Jan, 2015 had qualified for sleep O2 after hosp stay for AECOPD/ acute respiratory failure. FOLLOWS FOR:  Breathing doing well no concerns today.  Request order placed with [employer] for o2 to be picked up states does not use Feels well without significant cough or acute issues.  She reports CXR s have been done by Dr Shelia Media with her physical and remain "ok". Office Spirometry 12/16/13- Severe obstructive airways disease, FVC 2.13/ 65%, FEV1 1.08/ 44%, FEV1/FVC 0.51, FEF25-75% 0.47/ 22%.  06/22/14- 58 yo former smoker followed for COPD and allergic rhinitis, complicated by GERD and chronic back pain PCP Dr Shelia Media. FOLLOWS FOR: Pt states her breathing is doing well and feeling good overall. No recent acute illness.  Reviewed hx of rapid onset systemic reaction to pneumovax in past.  12/21/14- 55 yo former smoker followed for COPD and allergic rhinitis, complicated by GERD and chronic back pain PCP Dr Shelia Media. FOLLOWS FOR: Pt states her breathing is fine but has cough and sinus issues(went to [facility] July 10,2016 and came back with sinus infection-took Zpak).  CT chest 08/27/14 IMPRESSION: Scarring in the lingula corresponds to the density seen on chest x-ray. Right middle lobe scarring also noted. No suspicious pulmonary nodules. Mild emphysema. Coronary artery disease. Electronically Signed  By: Rolm Baptise M.D.  On: 08/28/2014 08:14  06/23/2015-75 year old female former smoker followed for COPD, allergic rhinitis, complicated by GERD, chronic back pain FOLLOW FOR: 6 month follow up for COPD.  breathing doing well.  no complaints. Describes herself is stable with no acute respiratory issues this winter.  Occasionally uses her nebulizer for increased wheezing but usually albuterol inhaler with 3 times a day is sufficient. History of intense reaction shortly after receiving pneumonia vaccine, leading to hospitalization. She is considered "allergic" and will not take pneumonia vaccine.  ROS-see HPI Constitutional:   No-   weight loss, night sweats, fevers, chills, fatigue, lassitude. HEENT:   No-  headaches, difficulty swallowing, tooth/dental problems, sore throat,       No- sneezing, itching, ear ache,nasal congestion, post nasal drip,  CV:  No-   chest pain, orthopnea, PND, swelling in lower extremities, anasarca, dizziness, palpitations Resp: + shortness of breath with exertion or at rest.              No-   productive cough,  + non-productive cough,  No- coughing up of blood.              No-   change in color of mucus.  +occasional mild wheezing.   Skin: No-   rash or lesions. GI:  No-   heartburn, indigestion, abdominal pain, nausea, vomiting,  GU:  MS:  No-   joint pain or swelling. +chronic back pain Neuro-     nothing unusual Psych:  No- change in mood or affect. No depression or anxiety.  No memory loss.  OBJ- Physical Exam General- Alert, Oriented, Affect-appropriate, Distress- none acute Skin- rash-none, lesions- none, excoriation- none Lymphadenopathy- none Head- atraumatic            Eyes- Gross vision intact, PERRLA, conjunctivae and secretions clear            Ears- Hearing, canals-normal            Nose- Clear, no-Septal dev, mucus, polyps, erosion, perforation             Throat- Mallampati II , mucosa clear , drainage- none, tonsils- atrophic Neck- flexible , trachea midline, no stridor , thyroid nl, carotid no bruit Chest - symmetrical excursion , unlabored           Heart/CV- RRR , no murmur , no gallop  , no rub, nl s1 s2                           - JVD- none , edema- none, stasis changes- none, varices- none           Lung-  +distant, cough- none, wheeze-none ,  dullness-none, rub- none           Chest wall-  Abd-  Br/ Gen/ Rectal- Not done, not indicated Extrem- cyanosis- none, clubbing, none, atrophy- none, strength- nl Neuro- restless

## 2015-06-23 NOTE — Patient Instructions (Signed)
Ok to continue present meds ° °Please call if we can help °

## 2015-06-30 DIAGNOSIS — M25561 Pain in right knee: Secondary | ICD-10-CM | POA: Diagnosis not present

## 2015-06-30 DIAGNOSIS — M1711 Unilateral primary osteoarthritis, right knee: Secondary | ICD-10-CM | POA: Diagnosis not present

## 2015-07-19 DIAGNOSIS — J4521 Mild intermittent asthma with (acute) exacerbation: Secondary | ICD-10-CM | POA: Diagnosis not present

## 2015-07-19 DIAGNOSIS — J329 Chronic sinusitis, unspecified: Secondary | ICD-10-CM | POA: Diagnosis not present

## 2015-07-30 DIAGNOSIS — J9811 Atelectasis: Secondary | ICD-10-CM | POA: Diagnosis not present

## 2015-07-30 DIAGNOSIS — J441 Chronic obstructive pulmonary disease with (acute) exacerbation: Secondary | ICD-10-CM | POA: Diagnosis not present

## 2015-07-30 DIAGNOSIS — R0789 Other chest pain: Secondary | ICD-10-CM | POA: Diagnosis not present

## 2015-09-29 DIAGNOSIS — Z0000 Encounter for general adult medical examination without abnormal findings: Secondary | ICD-10-CM | POA: Diagnosis not present

## 2015-09-29 DIAGNOSIS — R350 Frequency of micturition: Secondary | ICD-10-CM | POA: Diagnosis not present

## 2015-09-29 DIAGNOSIS — N3020 Other chronic cystitis without hematuria: Secondary | ICD-10-CM | POA: Diagnosis not present

## 2015-10-21 DIAGNOSIS — Z0000 Encounter for general adult medical examination without abnormal findings: Secondary | ICD-10-CM | POA: Diagnosis not present

## 2015-10-21 DIAGNOSIS — N3020 Other chronic cystitis without hematuria: Secondary | ICD-10-CM | POA: Diagnosis not present

## 2015-11-04 DIAGNOSIS — M545 Low back pain: Secondary | ICD-10-CM | POA: Diagnosis not present

## 2015-11-15 DIAGNOSIS — N3020 Other chronic cystitis without hematuria: Secondary | ICD-10-CM | POA: Diagnosis not present

## 2015-11-19 DIAGNOSIS — Z13220 Encounter for screening for lipoid disorders: Secondary | ICD-10-CM | POA: Diagnosis not present

## 2015-11-19 DIAGNOSIS — N390 Urinary tract infection, site not specified: Secondary | ICD-10-CM | POA: Diagnosis not present

## 2015-11-19 DIAGNOSIS — Z0001 Encounter for general adult medical examination with abnormal findings: Secondary | ICD-10-CM | POA: Diagnosis not present

## 2015-11-19 DIAGNOSIS — E559 Vitamin D deficiency, unspecified: Secondary | ICD-10-CM | POA: Diagnosis not present

## 2015-11-23 DIAGNOSIS — Z0000 Encounter for general adult medical examination without abnormal findings: Secondary | ICD-10-CM | POA: Diagnosis not present

## 2015-11-23 DIAGNOSIS — F419 Anxiety disorder, unspecified: Secondary | ICD-10-CM | POA: Diagnosis not present

## 2015-11-23 DIAGNOSIS — E7800 Pure hypercholesterolemia, unspecified: Secondary | ICD-10-CM | POA: Diagnosis not present

## 2015-11-23 DIAGNOSIS — J4520 Mild intermittent asthma, uncomplicated: Secondary | ICD-10-CM | POA: Diagnosis not present

## 2015-11-23 DIAGNOSIS — Z1212 Encounter for screening for malignant neoplasm of rectum: Secondary | ICD-10-CM | POA: Diagnosis not present

## 2016-01-03 DIAGNOSIS — N3010 Interstitial cystitis (chronic) without hematuria: Secondary | ICD-10-CM | POA: Diagnosis not present

## 2016-01-03 DIAGNOSIS — N3020 Other chronic cystitis without hematuria: Secondary | ICD-10-CM | POA: Diagnosis not present

## 2016-01-18 DIAGNOSIS — M545 Low back pain: Secondary | ICD-10-CM | POA: Diagnosis not present

## 2016-01-18 DIAGNOSIS — M5416 Radiculopathy, lumbar region: Secondary | ICD-10-CM | POA: Diagnosis not present

## 2016-02-29 DIAGNOSIS — Z961 Presence of intraocular lens: Secondary | ICD-10-CM | POA: Diagnosis not present

## 2016-02-29 DIAGNOSIS — H04123 Dry eye syndrome of bilateral lacrimal glands: Secondary | ICD-10-CM | POA: Diagnosis not present

## 2016-05-10 DIAGNOSIS — K219 Gastro-esophageal reflux disease without esophagitis: Secondary | ICD-10-CM | POA: Diagnosis not present

## 2016-05-10 DIAGNOSIS — E7800 Pure hypercholesterolemia, unspecified: Secondary | ICD-10-CM | POA: Diagnosis not present

## 2016-05-10 DIAGNOSIS — J4520 Mild intermittent asthma, uncomplicated: Secondary | ICD-10-CM | POA: Diagnosis not present

## 2016-05-10 DIAGNOSIS — F419 Anxiety disorder, unspecified: Secondary | ICD-10-CM | POA: Diagnosis not present

## 2016-06-02 DIAGNOSIS — J449 Chronic obstructive pulmonary disease, unspecified: Secondary | ICD-10-CM | POA: Diagnosis not present

## 2016-06-02 DIAGNOSIS — J019 Acute sinusitis, unspecified: Secondary | ICD-10-CM | POA: Diagnosis not present

## 2016-06-22 ENCOUNTER — Ambulatory Visit (INDEPENDENT_AMBULATORY_CARE_PROVIDER_SITE_OTHER): Payer: PPO | Admitting: Internal Medicine

## 2016-06-22 ENCOUNTER — Ambulatory Visit (INDEPENDENT_AMBULATORY_CARE_PROVIDER_SITE_OTHER)
Admission: RE | Admit: 2016-06-22 | Discharge: 2016-06-22 | Disposition: A | Discharge status: Home / Self Care | Payer: PPO | Source: Ambulatory Visit | Attending: Internal Medicine | Admitting: Internal Medicine

## 2016-06-22 ENCOUNTER — Encounter: Payer: Self-pay | Admitting: Internal Medicine

## 2016-06-22 VITALS — BP 118/74 | HR 74 | Ht 66.0 in | Wt 155.0 lb

## 2016-06-22 DIAGNOSIS — J441 Chronic obstructive pulmonary disease with (acute) exacerbation: Secondary | ICD-10-CM

## 2016-06-22 DIAGNOSIS — J449 Chronic obstructive pulmonary disease, unspecified: Secondary | ICD-10-CM | POA: Diagnosis not present

## 2016-06-22 NOTE — Patient Instructions (Signed)
Order- office spirometry    Dx COPD mixed type  Order- CXR       Ok to call for med refills or if we can help with anything

## 2016-06-22 NOTE — Progress Notes (Signed)
Patient ID: [name], female    DOB: [birth year], 76 y.o.   MRN: [id number]  HPI female former smoker followed for COPD, allergic rhinitis, complicated by GERD, chronic back pain Office Spirometry 12/16/13- Severe obstructive airways disease, FVC 2.13/ 65%, FEV1 1.08/ 44%, FEV1/FVC 0.51, FEF25-75% 0.47/ 22%. History of intense reaction shortly after receiving pneumonia vaccine, leading to hospitalization. She is considered "allergic" and will not take pneumonia vaccine Office Spirometry 06/22/2016-severe obstructive airways disease. FVC 2.44/78%, FEV1 1.04/44%, ratio 0.43, FEF 25-75% 0.39/21%  ----------------------------------------------------------------   06/23/2015-76 year old female former smoker followed for COPD, allergic rhinitis, complicated by GERD, chronic back pain FOLLOW FOR: 6 month follow up for COPD.  breathing doing well.  no complaints. Describes herself is stable with no acute respiratory issues this winter. Occasionally uses her nebulizer for increased wheezing but usually albuterol inhaler with 3 times a day is sufficient. History of intense reaction shortly after receiving pneumonia vaccine, leading to hospitalization. She is considered "allergic" and will not take pneumonia vaccine.  06/22/2016-76 year old female former smoker followed for COPD, allergic rhinitis, complicated by GERD, chronic back pain FOLLOWS FOR: Pt states she is doing well. Slight nasal drainage, but denies SOB, wheezing, cough, or congestion. Recent respiratory infection improved after Levaquin/Depo-Medrol Woke with incidental backache this morning. Heartburn well controlled. No acute complaints. Office Spirometry 06/22/2016-severe obstructive airways disease. FVC 2.44/78%, FEV1 1.04/44%, ratio 0.43, FEF 25-75% 0.39/21%  ROS-see HPI Constitutional:   No-   weight loss, night sweats, fevers, chills, fatigue, lassitude. HEENT:   No-  headaches, difficulty swallowing, tooth/dental problems,  sore throat,       No- sneezing, itching, ear ache,nasal congestion, +post nasal drip,  CV:  No-   chest pain, orthopnea, PND, swelling in lower extremities, anasarca, dizziness, palpitations Resp: + shortness of breath with exertion or at rest.              No-   productive cough,  + non-productive cough,  No- coughing up of blood.              No-   change in color of mucus.  +occasional mild wheezing.   Skin: No-   rash or lesions. GI:  No-   heartburn, indigestion, abdominal pain, nausea, vomiting,  GU:  MS:  No-   joint pain or swelling. + Acute/chronic back pain Neuro-     nothing unusual Psych:  No- change in mood or affect. No depression or anxiety.  No memory loss.  OBJ- Physical Exam General- Alert, Oriented, Affect-appropriate, Distress- none acute Skin- rash-none, lesions- none, excoriation- none Lymphadenopathy- none Head- atraumatic            Eyes- Gross vision intact, PERRLA, conjunctivae and secretions clear            Ears- Hearing, canals-normal            Nose- Clear, no-Septal dev, mucus, polyps, erosion, perforation             Throat- Mallampati II , mucosa clear , drainage- none, tonsils- atrophic Neck- flexible , trachea midline, no stridor , thyroid nl, carotid no bruit Chest - symmetrical excursion , unlabored           Heart/CV- RRR , no murmur , no gallop  , no rub, nl s1 s2                           - JVD- none , edema- none, stasis  changes- none, varices- none           Lung-  +distant/clear, cough- none, wheeze-none , dullness-none, rub- none           Chest wall-  Abd-  Br/ Gen/ Rectal- Not done, not indicated Extrem- cyanosis- none, clubbing, none, atrophy- none, strength- nl Neuro- restless

## 2016-06-22 NOTE — Assessment & Plan Note (Signed)
Simple spirometry nearly stable over the last 3 years as she remains off of cigarettes. She is comfortable with current medications. Plan-consider change to LABA/LAMA in future

## 2016-07-18 ENCOUNTER — Other Ambulatory Visit: Payer: Self-pay | Admitting: Internal Medicine

## 2016-07-18 MED ORDER — FLUTICASONE-SALMETEROL 250-50 MCG/DOSE IN AEPB: INHALATION_SPRAY | RESPIRATORY_TRACT | 2 refills | Status: DC

## 2016-12-01 DIAGNOSIS — E7800 Pure hypercholesterolemia, unspecified: Secondary | ICD-10-CM | POA: Diagnosis not present

## 2016-12-01 DIAGNOSIS — E559 Vitamin D deficiency, unspecified: Secondary | ICD-10-CM | POA: Diagnosis not present

## 2016-12-01 DIAGNOSIS — Z0000 Encounter for general adult medical examination without abnormal findings: Secondary | ICD-10-CM | POA: Diagnosis not present

## 2016-12-11 ENCOUNTER — Ambulatory Visit
Admission: RE | Admit: 2016-12-11 | Discharge: 2016-12-11 | Disposition: A | Discharge status: Home / Self Care | Payer: Self-pay | Source: Ambulatory Visit | Attending: Internal Medicine | Admitting: Internal Medicine

## 2016-12-11 ENCOUNTER — Other Ambulatory Visit: Payer: Self-pay | Admitting: Internal Medicine

## 2016-12-11 DIAGNOSIS — J441 Chronic obstructive pulmonary disease with (acute) exacerbation: Secondary | ICD-10-CM

## 2016-12-13 DIAGNOSIS — Z01419 Encounter for gynecological examination (general) (routine) without abnormal findings: Secondary | ICD-10-CM | POA: Diagnosis not present

## 2016-12-13 DIAGNOSIS — F419 Anxiety disorder, unspecified: Secondary | ICD-10-CM | POA: Diagnosis not present

## 2016-12-13 DIAGNOSIS — E559 Vitamin D deficiency, unspecified: Secondary | ICD-10-CM | POA: Diagnosis not present

## 2016-12-13 DIAGNOSIS — Z0000 Encounter for general adult medical examination without abnormal findings: Secondary | ICD-10-CM | POA: Diagnosis not present

## 2016-12-13 DIAGNOSIS — E7800 Pure hypercholesterolemia, unspecified: Secondary | ICD-10-CM | POA: Diagnosis not present

## 2016-12-13 DIAGNOSIS — Z1212 Encounter for screening for malignant neoplasm of rectum: Secondary | ICD-10-CM | POA: Diagnosis not present

## 2017-02-05 DIAGNOSIS — N3020 Other chronic cystitis without hematuria: Secondary | ICD-10-CM | POA: Diagnosis not present

## 2017-02-05 DIAGNOSIS — N281 Cyst of kidney, acquired: Secondary | ICD-10-CM | POA: Diagnosis not present

## 2017-02-05 DIAGNOSIS — N3010 Interstitial cystitis (chronic) without hematuria: Secondary | ICD-10-CM | POA: Diagnosis not present

## 2017-02-19 DIAGNOSIS — N952 Postmenopausal atrophic vaginitis: Secondary | ICD-10-CM | POA: Diagnosis not present

## 2017-02-19 DIAGNOSIS — N816 Rectocele: Secondary | ICD-10-CM | POA: Diagnosis not present

## 2017-02-19 DIAGNOSIS — N398 Other specified disorders of urinary system: Secondary | ICD-10-CM | POA: Diagnosis not present

## 2017-03-01 DIAGNOSIS — E7800 Pure hypercholesterolemia, unspecified: Secondary | ICD-10-CM | POA: Diagnosis not present

## 2017-03-01 DIAGNOSIS — K219 Gastro-esophageal reflux disease without esophagitis: Secondary | ICD-10-CM | POA: Diagnosis not present

## 2017-03-01 DIAGNOSIS — E559 Vitamin D deficiency, unspecified: Secondary | ICD-10-CM | POA: Diagnosis not present

## 2017-03-21 DIAGNOSIS — N398 Other specified disorders of urinary system: Secondary | ICD-10-CM | POA: Diagnosis not present

## 2017-03-21 DIAGNOSIS — N952 Postmenopausal atrophic vaginitis: Secondary | ICD-10-CM | POA: Diagnosis not present

## 2017-03-21 DIAGNOSIS — N393 Stress incontinence (female) (male): Secondary | ICD-10-CM | POA: Diagnosis not present

## 2017-03-21 DIAGNOSIS — N816 Rectocele: Secondary | ICD-10-CM | POA: Diagnosis not present

## 2017-04-26 DIAGNOSIS — N3000 Acute cystitis without hematuria: Secondary | ICD-10-CM | POA: Diagnosis not present

## 2017-05-10 DIAGNOSIS — R05 Cough: Secondary | ICD-10-CM | POA: Diagnosis not present

## 2017-05-10 DIAGNOSIS — J069 Acute upper respiratory infection, unspecified: Secondary | ICD-10-CM | POA: Diagnosis not present

## 2017-05-10 DIAGNOSIS — R6889 Other general symptoms and signs: Secondary | ICD-10-CM | POA: Diagnosis not present

## 2017-05-10 DIAGNOSIS — R509 Fever, unspecified: Secondary | ICD-10-CM | POA: Diagnosis not present

## 2017-05-10 DIAGNOSIS — Z8701 Personal history of pneumonia (recurrent): Secondary | ICD-10-CM | POA: Diagnosis not present

## 2017-05-10 DIAGNOSIS — J329 Chronic sinusitis, unspecified: Secondary | ICD-10-CM | POA: Diagnosis not present

## 2017-05-14 DIAGNOSIS — D72810 Lymphocytopenia: Secondary | ICD-10-CM | POA: Diagnosis not present

## 2017-05-14 DIAGNOSIS — J13 Pneumonia due to Streptococcus pneumoniae: Secondary | ICD-10-CM | POA: Diagnosis not present

## 2017-06-04 DIAGNOSIS — N993 Prolapse of vaginal vault after hysterectomy: Secondary | ICD-10-CM | POA: Diagnosis not present

## 2017-06-04 DIAGNOSIS — N398 Other specified disorders of urinary system: Secondary | ICD-10-CM | POA: Diagnosis not present

## 2017-06-04 DIAGNOSIS — N812 Incomplete uterovaginal prolapse: Secondary | ICD-10-CM | POA: Diagnosis not present

## 2017-06-04 DIAGNOSIS — N393 Stress incontinence (female) (male): Secondary | ICD-10-CM | POA: Diagnosis not present

## 2017-06-04 DIAGNOSIS — N895 Stricture and atresia of vagina: Secondary | ICD-10-CM | POA: Diagnosis not present

## 2017-06-04 DIAGNOSIS — N816 Rectocele: Secondary | ICD-10-CM | POA: Diagnosis not present

## 2017-06-04 DIAGNOSIS — N952 Postmenopausal atrophic vaginitis: Secondary | ICD-10-CM | POA: Diagnosis not present

## 2017-06-11 ENCOUNTER — Other Ambulatory Visit: Payer: Self-pay | Admitting: *Deleted

## 2017-06-11 NOTE — Patient Outreach (Signed)
[facility]) Care Management  06/11/2017  [name] 1940/07/04 [id number]  Referral from Nurse Call Center; called received 06/09/2017;  Reason: " Caller had bladder tack & vaginal wall surgery Monday 06/04/2017-still in lots of pain. Pain medicine not relieving pain .Patient was advised to call her PCP within 24 hours by Nurse call center.   Follow up call to patient who was advised of reason for call. HIPPA verification received from patient.   Patient voices that she is feeling much better. States she has been in contact with her surgeon and has an appointment set up for follow up this week.   States no further concerns. Follow up on  Nurseline call completed.  Plan: Send to care management  assistant to close case.   Sherrin Daisy, RN BSN [place] Management Coordinator [facility] Care Management  [phone]

## 2017-06-14 DIAGNOSIS — E559 Vitamin D deficiency, unspecified: Secondary | ICD-10-CM | POA: Diagnosis not present

## 2017-06-14 DIAGNOSIS — K219 Gastro-esophageal reflux disease without esophagitis: Secondary | ICD-10-CM | POA: Diagnosis not present

## 2017-06-14 DIAGNOSIS — G4700 Insomnia, unspecified: Secondary | ICD-10-CM | POA: Diagnosis not present

## 2017-06-14 DIAGNOSIS — F419 Anxiety disorder, unspecified: Secondary | ICD-10-CM | POA: Diagnosis not present

## 2017-06-14 DIAGNOSIS — E7800 Pure hypercholesterolemia, unspecified: Secondary | ICD-10-CM | POA: Diagnosis not present

## 2017-06-14 DIAGNOSIS — J449 Chronic obstructive pulmonary disease, unspecified: Secondary | ICD-10-CM | POA: Diagnosis not present

## 2017-06-20 DIAGNOSIS — Z9889 Other specified postprocedural states: Secondary | ICD-10-CM | POA: Diagnosis not present

## 2017-06-25 ENCOUNTER — Encounter: Payer: Self-pay | Admitting: Internal Medicine

## 2017-06-25 ENCOUNTER — Ambulatory Visit: Payer: PPO | Admitting: Internal Medicine

## 2017-06-25 DIAGNOSIS — K219 Gastro-esophageal reflux disease without esophagitis: Secondary | ICD-10-CM

## 2017-06-25 DIAGNOSIS — J441 Chronic obstructive pulmonary disease with (acute) exacerbation: Secondary | ICD-10-CM | POA: Diagnosis not present

## 2017-06-25 MED ORDER — FLUTICASONE-UMECLIDIN-VILANT 100-62.5-25 MCG/INH IN AEPB: 1.0000 | INHALATION_SPRAY | RESPIRATORY_TRACT | 0 refills | Status: AC

## 2017-06-25 MED ORDER — DOXYCYCLINE HYCLATE 100 MG PO TABS: 100.0000 mg | ORAL_TABLET | Freq: Two times a day (BID) | ORAL | 0 refills | Status: DC

## 2017-06-25 MED ORDER — LEVALBUTEROL TARTRATE 45 MCG/ACT IN AERO: INHALATION_SPRAY | RESPIRATORY_TRACT | 12 refills | Status: DC

## 2017-06-25 NOTE — Patient Instructions (Signed)
Sample Trelegy inhaler    Inhale 1 puff, once daily, instead of Advair. When you run out of the sample, go back to Advair for comparison  Script sent to [facility] for doxycycline antibiotic for bronchitis  Script sent to [facility] to change ProAir rescue inhaler to Xopenex/ levalbuterol  Please call as needed

## 2017-06-25 NOTE — Assessment & Plan Note (Signed)
Acute URI triggering exacerbation bronchitis/COPD. Plan-doxycycline, hydration, okay to try sample Trelegy instead of Advair.  Doxycycline sent.  Okay to replace ProAir with Xopenex.

## 2017-06-25 NOTE — Assessment & Plan Note (Signed)
Emphasized continued attention to reflux precautions. 

## 2017-06-25 NOTE — Progress Notes (Signed)
Patient ID: [name], female    DOB: 1941/03/17, 77 y.o.   MRN: [id number]  HPI female former smoker followed for COPD, allergic rhinitis, complicated by GERD, chronic back pain Office Spirometry 12/16/13- Severe obstructive airways disease, FVC 2.13/ 65%, FEV1 1.08/ 44%, FEV1/FVC 0.51, FEF25-75% 0.47/ 22%. History of intense reaction shortly after receiving pneumonia vaccine, leading to hospitalization. She is considered "allergic" and will not take pneumonia vaccine Office Spirometry 06/22/2016-severe obstructive airways disease. FVC 2.44/78%, FEV1 1.04/44%, ratio 0.43, FEF 25-75% 0.39/21%  ----------------------------------------------------------------  06/22/2016-77 year old female former smoker followed for COPD, allergic rhinitis, complicated by GERD, chronic back pain FOLLOWS FOR: Pt states she is doing well. Slight nasal drainage, but denies SOB, wheezing, cough, or congestion. Recent respiratory infection improved after Levaquin/Depo-Medrol Woke with incidental backache this morning. Heartburn well controlled. No acute complaints. Office Spirometry 06/22/2016-severe obstructive airways disease. FVC 2.44/78%, FEV1 1.04/44%, ratio 0.43, FEF 25-75% 0.39/21%  06/25/17- 77 year old female former smoker followed for COPD, allergic rhinitis, complicated by GERD, chronic back pain Recent surgery for pelvic floor repair ----Last 3 days patient has productive cough-green thick mucus, has hard time getting it up, pt states it chokes her. Pt has sore throat, has tightness in upper back. Spiriva Respimat, Advair 250, nebulizer albuterol, albuterol HFA Sore throat, no definite fever or chills.  Bronchitis 2 months ago was treated with steroid shot and Levaquin.  Tussive muscle pain left back, no rash.  Allergic pneumonia vaccine. "Pro-air chokes me", does okay with Advair but asks about Trelegy trial.  ROS-see HPI    + = positive Constitutional:   No-   weight loss, night sweats, fevers,  chills, fatigue, lassitude. HEENT:   No-  headaches, difficulty swallowing, tooth/dental problems, sore throat,       No- sneezing, itching, ear ache,nasal congestion, +post nasal drip,  CV:  No-   chest pain, orthopnea, PND, swelling in lower extremities, anasarca, dizziness, palpitations Resp: + shortness of breath with exertion or at rest.              No-   productive cough,  + non-productive cough,  No- coughing up of blood.              No-   change in color of mucus.  +occasional mild wheezing.   Skin: No-   rash or lesions. GI:  No-   heartburn, indigestion, abdominal pain, nausea, vomiting,  GU:  MS:  No-   joint pain or swelling. + Acute/chronic back pain Neuro-     nothing unusual Psych:  No- change in mood or affect. No depression or anxiety.  No memory loss.  OBJ- Physical Exam General- Alert, Oriented, Affect-appropriate, Distress- none acute Skin- rash-none, lesions- none, excoriation- none Lymphadenopathy- none Head- atraumatic            Eyes- Gross vision intact, PERRLA, conjunctivae and secretions clear            Ears- Hearing, canals-normal            Nose- Clear, no-Septal dev, mucus, polyps, erosion, perforation             Throat- Mallampati II , mucosa clear , drainage- none, tonsils- atrophic Neck- flexible , trachea midline, no stridor , thyroid nl, carotid no bruit Chest - symmetrical excursion , unlabored           Heart/CV- RRR , no murmur , no gallop  , no rub, nl s1 s2                           -  JVD- none , edema- none, stasis changes- none, varices- none           Lung-  +distant trace rhonchi in bases, cough- none, wheeze-none , dullness-none, rub- none           Chest wall-  Abd-  Br/ Gen/ Rectal- Not done, not indicated Extrem- cyanosis- none, clubbing, none, atrophy- none, strength- nl Neuro- restless

## 2017-07-27 ENCOUNTER — Other Ambulatory Visit: Payer: Self-pay | Admitting: Internal Medicine

## 2017-07-27 DIAGNOSIS — H6981 Other specified disorders of Eustachian tube, right ear: Secondary | ICD-10-CM | POA: Diagnosis not present

## 2017-07-27 DIAGNOSIS — R42 Dizziness and giddiness: Secondary | ICD-10-CM | POA: Diagnosis not present

## 2017-09-05 DIAGNOSIS — R34 Anuria and oliguria: Secondary | ICD-10-CM | POA: Diagnosis not present

## 2017-09-05 DIAGNOSIS — N3281 Overactive bladder: Secondary | ICD-10-CM | POA: Diagnosis not present

## 2017-10-08 DIAGNOSIS — Z961 Presence of intraocular lens: Secondary | ICD-10-CM | POA: Diagnosis not present

## 2017-10-15 DIAGNOSIS — N3281 Overactive bladder: Secondary | ICD-10-CM | POA: Diagnosis not present

## 2017-10-15 DIAGNOSIS — N390 Urinary tract infection, site not specified: Secondary | ICD-10-CM | POA: Diagnosis not present

## 2017-10-15 DIAGNOSIS — R300 Dysuria: Secondary | ICD-10-CM | POA: Diagnosis not present

## 2017-10-24 DIAGNOSIS — N3281 Overactive bladder: Secondary | ICD-10-CM | POA: Diagnosis not present

## 2017-12-04 ENCOUNTER — Other Ambulatory Visit: Payer: Self-pay | Admitting: Internal Medicine

## 2017-12-04 MED ORDER — ALBUTEROL SULFATE HFA 108 (90 BASE) MCG/ACT IN AERS: 2.0000 | INHALATION_SPRAY | Freq: Four times a day (QID) | RESPIRATORY_TRACT | 11 refills | Status: DC | PRN

## 2017-12-04 NOTE — Telephone Encounter (Signed)
Per CY-okay to refill as requested. 

## 2017-12-07 ENCOUNTER — Telehealth: Payer: Self-pay | Admitting: Pulmonary Disease

## 2017-12-07 NOTE — Telephone Encounter (Signed)
Received a PA request from [facility] for patient's Advair 250/50 diskus. PA has been started via [email or web address]. Key is [id number]. Will await determination.

## 2017-12-10 NOTE — Telephone Encounter (Signed)
Spoke with pharmacy tech at [facility]. They are aware of the approval. They will inform the patient. Nothing else needed at time of call.

## 2017-12-12 DIAGNOSIS — N390 Urinary tract infection, site not specified: Secondary | ICD-10-CM | POA: Diagnosis not present

## 2017-12-12 DIAGNOSIS — E559 Vitamin D deficiency, unspecified: Secondary | ICD-10-CM | POA: Diagnosis not present

## 2017-12-12 DIAGNOSIS — E7800 Pure hypercholesterolemia, unspecified: Secondary | ICD-10-CM | POA: Diagnosis not present

## 2017-12-12 DIAGNOSIS — Z0000 Encounter for general adult medical examination without abnormal findings: Secondary | ICD-10-CM | POA: Diagnosis not present

## 2018-01-09 DIAGNOSIS — Z0000 Encounter for general adult medical examination without abnormal findings: Secondary | ICD-10-CM | POA: Diagnosis not present

## 2018-01-09 DIAGNOSIS — N390 Urinary tract infection, site not specified: Secondary | ICD-10-CM | POA: Diagnosis not present

## 2018-01-23 DIAGNOSIS — N3281 Overactive bladder: Secondary | ICD-10-CM | POA: Diagnosis not present

## 2018-01-23 DIAGNOSIS — N9089 Other specified noninflammatory disorders of vulva and perineum: Secondary | ICD-10-CM | POA: Diagnosis not present

## 2018-01-30 DIAGNOSIS — N390 Urinary tract infection, site not specified: Secondary | ICD-10-CM | POA: Diagnosis not present

## 2018-01-30 DIAGNOSIS — B9620 Unspecified Escherichia coli [E. coli] as the cause of diseases classified elsewhere: Secondary | ICD-10-CM | POA: Diagnosis not present

## 2018-01-30 DIAGNOSIS — N3281 Overactive bladder: Secondary | ICD-10-CM | POA: Diagnosis not present

## 2018-01-30 DIAGNOSIS — N952 Postmenopausal atrophic vaginitis: Secondary | ICD-10-CM | POA: Diagnosis not present

## 2018-02-22 DIAGNOSIS — Z23 Encounter for immunization: Secondary | ICD-10-CM | POA: Diagnosis not present

## 2018-03-20 DIAGNOSIS — J019 Acute sinusitis, unspecified: Secondary | ICD-10-CM | POA: Diagnosis not present

## 2018-03-20 DIAGNOSIS — B373 Candidiasis of vulva and vagina: Secondary | ICD-10-CM | POA: Diagnosis not present

## 2018-03-20 DIAGNOSIS — J441 Chronic obstructive pulmonary disease with (acute) exacerbation: Secondary | ICD-10-CM | POA: Diagnosis not present

## 2018-04-18 DIAGNOSIS — J441 Chronic obstructive pulmonary disease with (acute) exacerbation: Secondary | ICD-10-CM | POA: Diagnosis not present

## 2018-05-23 DIAGNOSIS — J0100 Acute maxillary sinusitis, unspecified: Secondary | ICD-10-CM | POA: Diagnosis not present

## 2018-05-23 DIAGNOSIS — J449 Chronic obstructive pulmonary disease, unspecified: Secondary | ICD-10-CM | POA: Diagnosis not present

## 2018-05-26 DIAGNOSIS — J441 Chronic obstructive pulmonary disease with (acute) exacerbation: Secondary | ICD-10-CM | POA: Diagnosis not present

## 2018-05-30 DIAGNOSIS — J449 Chronic obstructive pulmonary disease, unspecified: Secondary | ICD-10-CM | POA: Diagnosis not present

## 2018-05-30 DIAGNOSIS — J441 Chronic obstructive pulmonary disease with (acute) exacerbation: Secondary | ICD-10-CM | POA: Diagnosis not present

## 2018-06-27 ENCOUNTER — Encounter: Payer: Self-pay | Admitting: Internal Medicine

## 2018-06-27 ENCOUNTER — Ambulatory Visit (INDEPENDENT_AMBULATORY_CARE_PROVIDER_SITE_OTHER): Payer: PPO | Admitting: Internal Medicine

## 2018-06-27 VITALS — BP 136/64 | HR 68 | Ht 66.0 in | Wt 168.0 lb

## 2018-06-27 DIAGNOSIS — K219 Gastro-esophageal reflux disease without esophagitis: Secondary | ICD-10-CM

## 2018-06-27 DIAGNOSIS — J449 Chronic obstructive pulmonary disease, unspecified: Secondary | ICD-10-CM

## 2018-06-27 MED ORDER — FLUTICASONE-UMECLIDIN-VILANT 100-62.5-25 MCG/INH IN AEPB: 1.0000 | INHALATION_SPRAY | Freq: Every day | RESPIRATORY_TRACT | 12 refills | Status: DC

## 2018-06-27 MED ORDER — FLUTICASONE-UMECLIDIN-VILANT 100-62.5-25 MCG/INH IN AEPB: 1.0000 | INHALATION_SPRAY | Freq: Every day | RESPIRATORY_TRACT | 0 refills | Status: DC

## 2018-06-27 NOTE — Patient Instructions (Addendum)
Sample and print script Trelegy inhaler     Inhale 1 puff then rinse mouth, once daily Try this instead of Advair/ Symbicort and Spiriva.  If you like the Trelegy, you can check price at drug store with the printed script.  Ok to use up the leftover Advair and Spiriva

## 2018-06-27 NOTE — Assessment & Plan Note (Signed)
Severe COPD.  She got help from her PCP office to get through a significant bronchitis without pneumonia, earlier this winter.  We considered medicines.  It may be cheaper for her to use Trelegy rather than combination of Advair and Spiriva. Plan-try sample and prescription Trelegy as discussed.

## 2018-06-27 NOTE — Assessment & Plan Note (Signed)
Reflux precautions reemphasized.  She seems to feel pretty well controlled now.

## 2018-06-27 NOTE — Progress Notes (Signed)
Patient ID: [name], female    DOB: [birth year], 78 y.o.   MRN: [id number]  HPI female former smoker followed for COPD, allergic rhinitis, complicated by GERD, chronic back pain Office Spirometry 12/16/13- Severe obstructive airways disease, FVC 2.13/ 65%, FEV1 1.08/ 44%, FEV1/FVC 0.51, FEF25-75% 0.47/ 22%. History of intense reaction shortly after receiving pneumonia vaccine, leading to hospitalization. She is considered "allergic" and will not take pneumonia vaccine Office Spirometry 06/22/2016-severe obstructive airways disease. FVC 2.44/78%, FEV1 1.04/44%, ratio 0.43, FEF 25-75% 0.39/21%  ---------------------------------------------------------------- 06/25/17- 78 year old female former smoker followed for COPD, allergic rhinitis, complicated by GERD, chronic back pain Recent surgery for pelvic floor repair ----Last 3 days patient has productive cough-green thick mucus, has hard time getting it up, pt states it chokes her. Pt has sore throat, has tightness in upper back. Spiriva Respimat, Advair 250, nebulizer albuterol, albuterol HFA Sore throat, no definite fever or chills.  Bronchitis 2 months ago was treated with steroid shot and Levaquin.  Tussive muscle pain left back, no rash.  Allergic pneumonia vaccine. "Pro-air chokes me", does okay with Advair but asks about Trelegy trial.  06/27/2018- 78 year old female former smoker followed for COPD, allergic rhinitis, complicated by GERD, chronic back pain -----1 year rov COPD.  pt states she is at baseline, noted flu-like illness around Christmas. albuterol HFA, neb albuterol, Advair 250, Spiriva Respimat 2.5, Had a slow recovery from an acute bronchitis exacerbation earlier this winter.  Chest x-ray at her PCP office was said to be okay, without pneumonia or acute concern.  I do not have that report.  She is on maintenance Keflex 250 mg daily for bladder prophylaxis. Medication discussion.  ROS-see HPI    + =  positive Constitutional:   No-   weight loss, night sweats, fevers, chills, fatigue, lassitude. HEENT:   No-  headaches, difficulty swallowing, tooth/dental problems, sore throat,       No- sneezing, itching, ear ache,nasal congestion, +post nasal drip,  CV:  No-   chest pain, orthopnea, PND, swelling in lower extremities, anasarca, dizziness, palpitations Resp: + shortness of breath with exertion or at rest.              No-   productive cough,  + non-productive cough,  No- coughing up of blood.              No-   change in color of mucus.  +occasional mild wheezing.   Skin: No-   rash or lesions. GI:  No-   heartburn, indigestion, abdominal pain, nausea, vomiting,  GU:  MS:  No-   joint pain or swelling. + Acute/chronic back pain Neuro-     nothing unusual Psych:  No- change in mood or affect. No depression or anxiety.  No memory loss.  OBJ- Physical Exam General- Alert, Oriented, Affect-appropriate, Distress- none acute Skin- rash-none, lesions- none, excoriation- none Lymphadenopathy- none Head- atraumatic            Eyes- Gross vision intact, PERRLA, conjunctivae and secretions clear            Ears- Hearing, canals-normal            Nose- Clear, no-Septal dev, mucus, polyps, erosion, perforation             Throat- Mallampati II , mucosa clear , drainage- none, tonsils- atrophic Neck- flexible , trachea midline, no stridor , thyroid nl, carotid no bruit Chest - symmetrical excursion , unlabored  Heart/CV- RRR , no murmur , no gallop  , no rub, nl s1 s2                           - JVD- none , edema- none, stasis changes- none, varices- none           Lung-  +distant trace rhonchi in bases, cough- none, wheeze+ mild , dullness-none, rub- none           Chest wall-  Abd-  Br/ Gen/ Rectal- Not done, not indicated Extrem- cyanosis- none, clubbing, none, atrophy- none, strength- nl Neuro- restless

## 2018-07-24 DIAGNOSIS — N390 Urinary tract infection, site not specified: Secondary | ICD-10-CM | POA: Diagnosis not present

## 2018-09-06 ENCOUNTER — Other Ambulatory Visit: Payer: Self-pay | Admitting: Internal Medicine

## 2018-09-09 ENCOUNTER — Telehealth: Payer: Self-pay | Admitting: Internal Medicine

## 2018-09-09 ENCOUNTER — Other Ambulatory Visit: Payer: Self-pay | Admitting: Internal Medicine

## 2018-09-09 MED ORDER — FLUTICASONE-SALMETEROL 250-50 MCG/DOSE IN AEPB: INHALATION_SPRAY | RESPIRATORY_TRACT | 2 refills | Status: DC

## 2018-09-09 NOTE — Telephone Encounter (Signed)
Pt is calling again to find out why her prescription was denied

## 2018-09-09 NOTE — Telephone Encounter (Signed)
Called pt back for her question regarding why Advair rx was denied. According to [place] 06/27/2018, CY wanted pt to try Trelegy via samples and print script. Pt stated Trelegy is non-therapeutic for her and that Advair worked better. I spoke with CY and he verbalized it would be ok to refill Advair. Called pt back to inform her refill will be sent and verified preferred pharmacy.   Order has been placed for Advair per CY. Nothing further needed at this time.

## 2018-11-19 DIAGNOSIS — J441 Chronic obstructive pulmonary disease with (acute) exacerbation: Secondary | ICD-10-CM | POA: Diagnosis not present

## 2018-12-26 ENCOUNTER — Other Ambulatory Visit: Payer: Self-pay

## 2018-12-26 ENCOUNTER — Ambulatory Visit (INDEPENDENT_AMBULATORY_CARE_PROVIDER_SITE_OTHER): Payer: PPO

## 2018-12-26 ENCOUNTER — Ambulatory Visit: Payer: PPO | Admitting: Internal Medicine

## 2018-12-26 ENCOUNTER — Encounter: Payer: Self-pay | Admitting: Internal Medicine

## 2018-12-26 VITALS — BP 140/90 | HR 80 | Temp 97.9°F | Ht 66.0 in | Wt 168.0 lb

## 2018-12-26 DIAGNOSIS — J449 Chronic obstructive pulmonary disease, unspecified: Secondary | ICD-10-CM

## 2018-12-26 DIAGNOSIS — I82502 Chronic embolism and thrombosis of unspecified deep veins of left lower extremity: Secondary | ICD-10-CM | POA: Diagnosis not present

## 2018-12-26 DIAGNOSIS — IMO0001 Reserved for inherently not codable concepts without codable children: Secondary | ICD-10-CM

## 2018-12-26 MED ORDER — METHYLPREDNISOLONE ACETATE 80 MG/ML IJ SUSP
80.0000 mg | Freq: Once | INTRAMUSCULAR | Status: AC
  Administered 2018-12-26: 80 mg via INTRAMUSCULAR

## 2018-12-26 MED ORDER — ALBUTEROL SULFATE HFA 108 (90 BASE) MCG/ACT IN AERS: 2.0000 | INHALATION_SPRAY | Freq: Four times a day (QID) | RESPIRATORY_TRACT | 12 refills | Status: DC | PRN

## 2018-12-26 MED ORDER — BEVESPI AEROSPHERE 9-4.8 MCG/ACT IN AERO: 2.0000 | INHALATION_SPRAY | Freq: Two times a day (BID) | RESPIRATORY_TRACT | 0 refills | Status: DC

## 2018-12-26 NOTE — Progress Notes (Signed)
Patient ID: [name], female    DOB: 1940-08-08, 78 y.o.   MRN: [id number]  HPI female former smoker followed for COPD, allergic rhinitis, complicated by GERD, chronic back pain Office Spirometry 12/16/13- Severe obstructive airways disease, FVC 2.13/ 65%, FEV1 1.08/ 44%, FEV1/FVC 0.51, FEF25-75% 0.47/ 22%. History of intense reaction shortly after receiving pneumonia vaccine, leading to hospitalization. She is considered "allergic" and will not take pneumonia vaccine Office Spirometry 06/22/2016-severe obstructive airways disease. FVC 2.44/78%, FEV1 1.04/44%, ratio 0.43, FEF 25-75% 0.39/21%  ---------------------------------------------------------------- 06/27/2018- 78 year old female former smoker followed for COPD, allergic rhinitis, complicated by GERD, chronic back pain -----1 year rov COPD.  pt states she is at baseline, noted flu-like illness around Christmas. albuterol HFA, neb albuterol, Advair 250, Spiriva Respimat 2.5, Had a slow recovery from an acute bronchitis exacerbation earlier this winter.  Chest x-ray at her PCP office was said to be okay, without pneumonia or acute concern.  I do not have that report.  She is on maintenance Keflex 250 mg daily for bladder prophylaxis. Medication discussion.  12/26/2018- 78 year old female former smoker followed for COPD, allergic rhinitis, complicated by GERD, chronic back pain -----pt states breathing varies; currently taking Advair 1 puff BID, plus Spiriva, states she isn't sure if it helps her Can't tell inhalers help much. Spending time at pool, but not very active. Little cough. Neb does help used up to 2-3x daily if needed. Trelegy > thrush. Asks depo inj- discussed steroid side effects.  Daily maint keflex- UTI prophy.  ROS-see HPI    + = positive Constitutional:   No-   weight loss, night sweats, fevers, chills, fatigue, lassitude. HEENT:   No-  headaches, difficulty swallowing, tooth/dental problems, sore throat,       No-  sneezing, itching, ear ache,nasal congestion, +post nasal drip,  CV:  No-   chest pain, orthopnea, PND, swelling in lower extremities, anasarca, dizziness, palpitations Resp: + shortness of breath with exertion or at rest.              No-   productive cough,  + non-productive cough,  No- coughing up of blood.              No-   change in color of mucus.  +occasional mild wheezing.   Skin: No-   rash or lesions. GI:  No-   heartburn, indigestion, abdominal pain, nausea, vomiting,  GU:  MS:  No-   joint pain or swelling. + Acute/chronic back pain Neuro-     nothing unusual Psych:  No- change in mood or affect. No depression or anxiety.  No memory loss.  OBJ- Physical Exam General- Alert, Oriented, Affect-appropriate, Distress- none acute Skin- rash-none, lesions- none, excoriation- none Lymphadenopathy- none Head- atraumatic            Eyes- Gross vision intact, PERRLA, conjunctivae and secretions clear            Ears- Hearing, canals-normal            Nose- Clear, no-Septal dev, mucus, polyps, erosion, perforation             Throat- Mallampati II , mucosa clear , drainage- none, tonsils- atrophic Neck- flexible , trachea midline, no stridor , thyroid nl, carotid no bruit Chest - symmetrical excursion , unlabored           Heart/CV- RRR , no murmur , no gallop  , no rub, nl s1 s2                           -  JVD- none , edema- none, stasis changes- none, varices- none           Lung-  +distant trace rhonchi in bases, cough- none, wheeze+ mild , dullness-none, rub- none           Chest wall-  Abd-  Br/ Gen/ Rectal- Not done, not indicated Extrem- cyanosis- none, clubbing, none, atrophy- none, strength- nl Neuro- restless

## 2018-12-26 NOTE — Patient Instructions (Signed)
Order- CXR    Dx COPD mixed type  Sample Bevespi inhaler  Inhale 2 puffs, twice daily     Try this while the sample lasts, instead of the Advair and Spiriva. When the sample runs out, go back to Advair and Spiriva for comparison.  Order- depo 80   Dx COPD mixed type  Please call if we can help

## 2019-01-08 DIAGNOSIS — E559 Vitamin D deficiency, unspecified: Secondary | ICD-10-CM | POA: Diagnosis not present

## 2019-01-08 DIAGNOSIS — E7800 Pure hypercholesterolemia, unspecified: Secondary | ICD-10-CM | POA: Diagnosis not present

## 2019-01-08 DIAGNOSIS — Z0000 Encounter for general adult medical examination without abnormal findings: Secondary | ICD-10-CM | POA: Diagnosis not present

## 2019-01-10 ENCOUNTER — Encounter (HOSPITAL_BASED_OUTPATIENT_CLINIC_OR_DEPARTMENT_OTHER): Payer: Self-pay

## 2019-01-10 ENCOUNTER — Emergency Department (HOSPITAL_BASED_OUTPATIENT_CLINIC_OR_DEPARTMENT_OTHER): Payer: PPO

## 2019-01-10 ENCOUNTER — Other Ambulatory Visit: Payer: Self-pay

## 2019-01-10 ENCOUNTER — Emergency Department (HOSPITAL_BASED_OUTPATIENT_CLINIC_OR_DEPARTMENT_OTHER)
Admission: EM | Admit: 2019-01-10 | Discharge: 2019-01-10 | Disposition: A | Discharge status: Home / Self Care | Payer: PPO | Source: Home / Self Care | Attending: Emergency Medicine | Admitting: Emergency Medicine

## 2019-01-10 ENCOUNTER — Other Ambulatory Visit: Payer: Self-pay | Admitting: Internal Medicine

## 2019-01-10 ENCOUNTER — Ambulatory Visit
Admission: RE | Admit: 2019-01-10 | Discharge: 2019-01-10 | Disposition: A | Discharge status: Home / Self Care | Payer: PPO | Source: Ambulatory Visit | Attending: Internal Medicine | Admitting: Internal Medicine

## 2019-01-10 DIAGNOSIS — I82402 Acute embolism and thrombosis of unspecified deep veins of left lower extremity: Secondary | ICD-10-CM | POA: Diagnosis not present

## 2019-01-10 DIAGNOSIS — I82412 Acute embolism and thrombosis of left femoral vein: Secondary | ICD-10-CM | POA: Diagnosis present

## 2019-01-10 DIAGNOSIS — I878 Other specified disorders of veins: Secondary | ICD-10-CM

## 2019-01-10 DIAGNOSIS — R600 Localized edema: Secondary | ICD-10-CM | POA: Diagnosis not present

## 2019-01-10 DIAGNOSIS — Z8249 Family history of ischemic heart disease and other diseases of the circulatory system: Secondary | ICD-10-CM | POA: Diagnosis not present

## 2019-01-10 DIAGNOSIS — Z882 Allergy status to sulfonamides status: Secondary | ICD-10-CM | POA: Diagnosis not present

## 2019-01-10 DIAGNOSIS — Z887 Allergy status to serum and vaccine status: Secondary | ICD-10-CM | POA: Diagnosis not present

## 2019-01-10 DIAGNOSIS — F329 Major depressive disorder, single episode, unspecified: Secondary | ICD-10-CM | POA: Diagnosis present

## 2019-01-10 DIAGNOSIS — I82432 Acute embolism and thrombosis of left popliteal vein: Secondary | ICD-10-CM | POA: Diagnosis present

## 2019-01-10 DIAGNOSIS — R2242 Localized swelling, mass and lump, left lower limb: Secondary | ICD-10-CM | POA: Insufficient documentation

## 2019-01-10 DIAGNOSIS — M79605 Pain in left leg: Secondary | ICD-10-CM | POA: Diagnosis not present

## 2019-01-10 DIAGNOSIS — M7989 Other specified soft tissue disorders: Secondary | ICD-10-CM | POA: Diagnosis present

## 2019-01-10 DIAGNOSIS — Z90710 Acquired absence of both cervix and uterus: Secondary | ICD-10-CM | POA: Diagnosis not present

## 2019-01-10 DIAGNOSIS — Z03818 Encounter for observation for suspected exposure to other biological agents ruled out: Secondary | ICD-10-CM | POA: Diagnosis not present

## 2019-01-10 DIAGNOSIS — Z87891 Personal history of nicotine dependence: Secondary | ICD-10-CM | POA: Diagnosis not present

## 2019-01-10 DIAGNOSIS — R609 Edema, unspecified: Secondary | ICD-10-CM

## 2019-01-10 DIAGNOSIS — M5489 Other dorsalgia: Secondary | ICD-10-CM | POA: Diagnosis not present

## 2019-01-10 DIAGNOSIS — I739 Peripheral vascular disease, unspecified: Secondary | ICD-10-CM | POA: Diagnosis present

## 2019-01-10 DIAGNOSIS — Z7951 Long term (current) use of inhaled steroids: Secondary | ICD-10-CM | POA: Diagnosis not present

## 2019-01-10 DIAGNOSIS — K661 Hemoperitoneum: Secondary | ICD-10-CM | POA: Diagnosis present

## 2019-01-10 DIAGNOSIS — Z881 Allergy status to other antibiotic agents status: Secondary | ICD-10-CM | POA: Diagnosis not present

## 2019-01-10 DIAGNOSIS — I824Z2 Acute embolism and thrombosis of unspecified deep veins of left distal lower extremity: Secondary | ICD-10-CM | POA: Diagnosis not present

## 2019-01-10 DIAGNOSIS — Z833 Family history of diabetes mellitus: Secondary | ICD-10-CM | POA: Diagnosis not present

## 2019-01-10 DIAGNOSIS — Z825 Family history of asthma and other chronic lower respiratory diseases: Secondary | ICD-10-CM | POA: Diagnosis not present

## 2019-01-10 DIAGNOSIS — M545 Low back pain: Secondary | ICD-10-CM | POA: Diagnosis not present

## 2019-01-10 DIAGNOSIS — Z79899 Other long term (current) drug therapy: Secondary | ICD-10-CM | POA: Diagnosis not present

## 2019-01-10 DIAGNOSIS — K219 Gastro-esophageal reflux disease without esophagitis: Secondary | ICD-10-CM | POA: Diagnosis present

## 2019-01-10 DIAGNOSIS — I82409 Acute embolism and thrombosis of unspecified deep veins of unspecified lower extremity: Secondary | ICD-10-CM | POA: Diagnosis not present

## 2019-01-10 DIAGNOSIS — J449 Chronic obstructive pulmonary disease, unspecified: Secondary | ICD-10-CM | POA: Diagnosis present

## 2019-01-10 DIAGNOSIS — R52 Pain, unspecified: Secondary | ICD-10-CM | POA: Diagnosis not present

## 2019-01-10 DIAGNOSIS — N179 Acute kidney failure, unspecified: Secondary | ICD-10-CM | POA: Diagnosis not present

## 2019-01-10 DIAGNOSIS — Z880 Allergy status to penicillin: Secondary | ICD-10-CM | POA: Diagnosis not present

## 2019-01-10 DIAGNOSIS — R58 Hemorrhage, not elsewhere classified: Secondary | ICD-10-CM | POA: Diagnosis not present

## 2019-01-10 DIAGNOSIS — I82422 Acute embolism and thrombosis of left iliac vein: Secondary | ICD-10-CM | POA: Diagnosis present

## 2019-01-10 DIAGNOSIS — I700 Atherosclerosis of aorta: Secondary | ICD-10-CM | POA: Diagnosis present

## 2019-01-10 DIAGNOSIS — J441 Chronic obstructive pulmonary disease with (acute) exacerbation: Secondary | ICD-10-CM | POA: Diagnosis not present

## 2019-01-10 DIAGNOSIS — Z20828 Contact with and (suspected) exposure to other viral communicable diseases: Secondary | ICD-10-CM | POA: Diagnosis present

## 2019-01-10 DIAGNOSIS — Z888 Allergy status to other drugs, medicaments and biological substances status: Secondary | ICD-10-CM | POA: Diagnosis not present

## 2019-01-10 NOTE — ED Triage Notes (Signed)
Pt c/o swelling to left LE x today-denies pain-NAD-steady gait

## 2019-01-10 NOTE — Discharge Instructions (Signed)
Your ultrasound today did not show any blood clots.  You likely have some venous stasis from the heat.  Please wear compression stockings as well as elevate your leg.  Try not to stand too much.  See your doctor in a week for follow-up.  Return to ER if you have worse leg swelling, trouble breathing, chest pain

## 2019-01-11 NOTE — ED Provider Notes (Signed)
[facility] Provider Note   CSN: [id number] Arrival date & time: 01/10/19  2026     History   Chief Complaint Chief Complaint  Patient presents with  . Leg Swelling    HPI [name] is a 78 y.o. female hx of asthma, COPD, who presented with left leg swelling.  She had acute onset left leg swelling started today.  She talked to her doctor and she had outpatient ultrasound that was performed. She is here to find out the results.  She denies any trauma or injury.  She denies any chest pain or shortness of breath.  Denies any recent travel or history of blood clots.  Denies any history of heart failure.  In fact patient had physical labs that were drawn several days ago and patient states tha her function was checked.  She has a bladder sling and urinates frequently but that is baseline.  Denies any dysuria.     The history is provided by the patient.    Past Medical History:  Diagnosis Date  . Allergic rhinitis   . Asthma   . Chronic airway obstruction, not elsewhere classified   . Depressive disorder, not elsewhere classified   . Esophageal reflux     Patient Active Problem List   Diagnosis Date Noted  . CAP (community acquired pneumonia) 05/27/2013  . SOB (shortness of breath) 05/27/2013  . Myalgia 05/27/2013  . COPD with acute exacerbation ([place]) 05/27/2013  . Acute respiratory failure with hypoxia ([place]) 05/27/2013  . Vertebral compression fracture ([place]) 09/15/2010  . SCIATICA 12/16/2007  . COPD mixed type ([place]) 07/17/2007  . ALLERGIC RHINITIS 07/16/2007  . BRONCHITIS 07/16/2007  . ASTHMA 07/16/2007  . Esophageal reflux 07/16/2007    Past Surgical History:  Procedure Laterality Date  . ABDOMINAL HYSTERECTOMY    . CYSTOSCOPY    . NASAL SINUS SURGERY       OB History   No obstetric history on file.      Home Medications    Prior to Admission medications   Medication Sig Start Date End Date Taking? Authorizing Provider   albuterol (PROVENTIL) (2.5 MG/3ML) 0.083% nebulizer solution Take 3 mLs (2.5 mg total) by nebulization every 6 (six) hours. 09/24/14   Deneise Lever, MD  albuterol (VENTOLIN HFA) 108 (90 Base) MCG/ACT inhaler Inhale 2 puffs into the lungs every 6 (six) hours as needed for wheezing or shortness of breath. 12/26/18   Baird Lyons D, MD  Fluticasone-Salmeterol (ADVAIR) 250-50 MCG/DOSE AEPB INHALE 1 DOSE BY MOUTH TWICE DAILY, RINSE MOUTH AFTER USE 09/09/18   Baird Lyons D, MD  Glycopyrrolate-Formoterol (BEVESPI AEROSPHERE) 9-4.8 MCG/ACT AERO Inhale 2 puffs into the lungs 2 (two) times a day. 12/26/18   Deneise Lever, MD  LORazepam (ATIVAN) 1 MG tablet Take 0.5-1 mg by mouth at bedtime.    [provider]  omeprazole (PRILOSEC) 20 MG capsule Take 1 tablet by mouth 2 (two) times daily before a meal.  09/13/11   [provider]  simvastatin (ZOCOR) 20 MG tablet Take 20 mg by mouth at bedtime. Reported on 06/23/2015    [provider]  SPIRIVA RESPIMAT 2.5 MCG/ACT AERS 1 puff daily.  06/01/16   [provider]  traZODone (DESYREL) 100 MG tablet Take 100 mg by mouth at bedtime.     [provider]    Family History Family History  Problem Relation Age of Onset  . Diabetes Mother   . Coronary artery disease Mother   .  Emphysema Father   . Coronary artery disease Brother     Social History Social History   Tobacco Use  . Smoking status: Former Smoker    Packs/day: 0.25    Years: 40.00    Pack years: 10.00    Types: Cigarettes    Quit date: 05/27/2013    Years since quitting: 5.6  . Smokeless tobacco: Never Used  Substance Use Topics  . Alcohol use: No  . Drug use: No     Allergies   Clarithromycin, Penicillins, Pneumococcal vaccines, Sulfonamide derivatives, and Estradiol   Review of Systems Review of Systems  Cardiovascular: Positive for leg swelling.  All other systems reviewed and are negative.    Physical Exam Updated Vital  Signs BP (!) 154/104 (BP Location: Left Arm)   Pulse 90   Temp 98.8 F (37.1 C) (Oral)   Resp 20   SpO2 97%   Physical Exam Vitals signs and nursing note reviewed.  Constitutional:      Appearance: Normal appearance.  HENT:     Head: Normocephalic.     Right Ear: Tympanic membrane normal.     Left Ear: Tympanic membrane normal.     Nose: Nose normal.     Mouth/Throat:     Mouth: Mucous membranes are moist.  Eyes:     Extraocular Movements: Extraocular movements intact.     Pupils: Pupils are equal, round, and reactive to light.  Neck:     Musculoskeletal: Normal range of motion.  Cardiovascular:     Rate and Rhythm: Normal rate and regular rhythm.     Pulses: Normal pulses.     Heart sounds: Normal heart sounds.  Pulmonary:     Effort: Pulmonary effort is normal.     Breath sounds: Normal breath sounds.  Abdominal:     General: Abdomen is flat.     Palpations: Abdomen is soft.  Musculoskeletal:     Comments: 1+ L leg edema.  Some venous stasis changes.  No obvious cellulitis.  He has good pedal pulses and neurovascularly intact in the left lower extremity.  She has no obvious calf tenderness. Varicose veins bilateral lower extremities   Skin:    General: Skin is warm.     Capillary Refill: Capillary refill takes less than 2 seconds.  Neurological:     General: No focal deficit present.     Mental Status: She is alert and oriented to person, place, and time.  Psychiatric:        Mood and Affect: Mood normal.        Behavior: Behavior normal.      ED Treatments / Results  Labs (all labs ordered are listed, but only abnormal results are displayed) Labs Reviewed - No data to display  EKG None  Radiology US Venous Img Lower Unilateral Left  Result Date: 01/10/2019 CLINICAL DATA:  Left lower extremity edema. EXAM: LEFT LOWER EXTREMITY VENOUS DOPPLER ULTRASOUND TECHNIQUE: Gray-scale sonography with graded compression, as well as color Doppler and duplex ultrasound  were performed to evaluate the lower extremity deep venous systems from the level of the common femoral vein and including the common femoral, femoral, profunda femoral, popliteal and calf veins including the posterior tibial, peroneal and gastrocnemius veins when visible. The superficial great saphenous vein was also interrogated. Spectral Doppler was utilized to evaluate flow at rest and with distal augmentation maneuvers in the common femoral, femoral and popliteal veins. COMPARISON:  None. FINDINGS: Contralateral Common Femoral Vein: Respiratory phasicity is normal and  symmetric with the symptomatic side. No evidence of thrombus. Normal compressibility. Common Femoral Vein: No evidence of thrombus. Normal compressibility, respiratory phasicity and response to augmentation. Saphenofemoral Junction: No evidence of thrombus. Normal compressibility and flow on color Doppler imaging. Profunda Femoral Vein: No evidence of thrombus. Normal compressibility and flow on color Doppler imaging. Femoral Vein: No evidence of thrombus. Normal compressibility, respiratory phasicity and response to augmentation. Popliteal Vein: No evidence of thrombus. Normal compressibility, respiratory phasicity and response to augmentation. Calf Veins: No evidence of thrombus. Normal compressibility and flow on color Doppler imaging. Superficial Great Saphenous Vein: No evidence of thrombus. Normal compressibility. Venous Reflux:  None. Other Findings: No evidence of superficial thrombophlebitis or abnormal fluid collection. IMPRESSION: No evidence of left lower extremity deep venous thrombosis. Electronically Signed   By: Aletta Edouard M.D.   On: 01/10/2019 15:58    Procedures Procedures (including critical care time)  Medications Ordered in ED Medications - No data to display   Initial Impression / Assessment and Plan / ED Course  I have reviewed the triage vital signs and the nursing notes.  Pertinent labs & imaging results  that were available during my care of the patient were reviewed by me and considered in my medical decision making (see chart for details).       [name] is a 78 y.o. female who presented with left leg swelling.  I reviewed the outpatient ultrasound and showed no DVT think she likely has some venous stasis changes and she does have some varicose veins.  She also apparently had blood work checked several days ago and had normal kidney function.  I was unable to find report here but her previous kidney function was normal system.  She has been urinating well has no chest pain or shortness of breath.  I told her to put on compression stockings and keep legs elevated.  Gave strict return precautions.   Final Clinical Impressions(s) / ED Diagnoses   Final diagnoses:  Left leg swelling  Venous stasis    ED Discharge Orders    None       Drenda Freeze, MD 01/11/19 1438

## 2019-01-13 ENCOUNTER — Inpatient Hospital Stay (HOSPITAL_COMMUNITY): Payer: PPO | Admitting: Certified Registered Nurse Anesthetist

## 2019-01-13 ENCOUNTER — Other Ambulatory Visit: Payer: Self-pay

## 2019-01-13 ENCOUNTER — Encounter (HOSPITAL_COMMUNITY)
Admission: EM | Disposition: A | Discharge status: Home / Self Care | Payer: Self-pay | Source: Home / Self Care | Attending: Internal Medicine

## 2019-01-13 ENCOUNTER — Emergency Department (HOSPITAL_COMMUNITY): Payer: PPO

## 2019-01-13 ENCOUNTER — Encounter (HOSPITAL_COMMUNITY): Payer: Self-pay | Admitting: Emergency Medicine

## 2019-01-13 ENCOUNTER — Inpatient Hospital Stay (HOSPITAL_COMMUNITY): Payer: PPO

## 2019-01-13 ENCOUNTER — Inpatient Hospital Stay (HOSPITAL_COMMUNITY)
Admission: EM | Admit: 2019-01-13 | Discharge: 2019-01-16 | DRG: 357 | Disposition: A | Discharge status: Home / Self Care | Payer: PPO | Attending: Internal Medicine | Admitting: Internal Medicine

## 2019-01-13 DIAGNOSIS — I82432 Acute embolism and thrombosis of left popliteal vein: Secondary | ICD-10-CM | POA: Diagnosis present

## 2019-01-13 DIAGNOSIS — N179 Acute kidney failure, unspecified: Secondary | ICD-10-CM

## 2019-01-13 DIAGNOSIS — J449 Chronic obstructive pulmonary disease, unspecified: Secondary | ICD-10-CM | POA: Diagnosis present

## 2019-01-13 DIAGNOSIS — Z8249 Family history of ischemic heart disease and other diseases of the circulatory system: Secondary | ICD-10-CM | POA: Diagnosis not present

## 2019-01-13 DIAGNOSIS — K661 Hemoperitoneum: Secondary | ICD-10-CM | POA: Diagnosis present

## 2019-01-13 DIAGNOSIS — Z7951 Long term (current) use of inhaled steroids: Secondary | ICD-10-CM

## 2019-01-13 DIAGNOSIS — I878 Other specified disorders of veins: Secondary | ICD-10-CM | POA: Diagnosis present

## 2019-01-13 DIAGNOSIS — Z888 Allergy status to other drugs, medicaments and biological substances status: Secondary | ICD-10-CM | POA: Diagnosis not present

## 2019-01-13 DIAGNOSIS — Z880 Allergy status to penicillin: Secondary | ICD-10-CM | POA: Diagnosis not present

## 2019-01-13 DIAGNOSIS — R58 Hemorrhage, not elsewhere classified: Secondary | ICD-10-CM | POA: Diagnosis not present

## 2019-01-13 DIAGNOSIS — Z882 Allergy status to sulfonamides status: Secondary | ICD-10-CM | POA: Diagnosis not present

## 2019-01-13 DIAGNOSIS — I82412 Acute embolism and thrombosis of left femoral vein: Secondary | ICD-10-CM | POA: Diagnosis present

## 2019-01-13 DIAGNOSIS — I82402 Acute embolism and thrombosis of unspecified deep veins of left lower extremity: Secondary | ICD-10-CM | POA: Diagnosis not present

## 2019-01-13 DIAGNOSIS — Z79899 Other long term (current) drug therapy: Secondary | ICD-10-CM

## 2019-01-13 DIAGNOSIS — Z887 Allergy status to serum and vaccine status: Secondary | ICD-10-CM | POA: Diagnosis not present

## 2019-01-13 DIAGNOSIS — Z833 Family history of diabetes mellitus: Secondary | ICD-10-CM

## 2019-01-13 DIAGNOSIS — F329 Major depressive disorder, single episode, unspecified: Secondary | ICD-10-CM | POA: Diagnosis present

## 2019-01-13 DIAGNOSIS — I739 Peripheral vascular disease, unspecified: Secondary | ICD-10-CM | POA: Diagnosis present

## 2019-01-13 DIAGNOSIS — I700 Atherosclerosis of aorta: Secondary | ICD-10-CM | POA: Diagnosis present

## 2019-01-13 DIAGNOSIS — Z20828 Contact with and (suspected) exposure to other viral communicable diseases: Secondary | ICD-10-CM | POA: Diagnosis present

## 2019-01-13 DIAGNOSIS — I824Z2 Acute embolism and thrombosis of unspecified deep veins of left distal lower extremity: Secondary | ICD-10-CM | POA: Diagnosis not present

## 2019-01-13 DIAGNOSIS — Z90710 Acquired absence of both cervix and uterus: Secondary | ICD-10-CM

## 2019-01-13 DIAGNOSIS — Z87891 Personal history of nicotine dependence: Secondary | ICD-10-CM | POA: Diagnosis not present

## 2019-01-13 DIAGNOSIS — K683 Retroperitoneal hematoma: Secondary | ICD-10-CM | POA: Diagnosis present

## 2019-01-13 DIAGNOSIS — K219 Gastro-esophageal reflux disease without esophagitis: Secondary | ICD-10-CM | POA: Diagnosis present

## 2019-01-13 DIAGNOSIS — Z881 Allergy status to other antibiotic agents status: Secondary | ICD-10-CM | POA: Diagnosis not present

## 2019-01-13 DIAGNOSIS — Z825 Family history of asthma and other chronic lower respiratory diseases: Secondary | ICD-10-CM | POA: Diagnosis not present

## 2019-01-13 DIAGNOSIS — M79605 Pain in left leg: Secondary | ICD-10-CM | POA: Diagnosis not present

## 2019-01-13 DIAGNOSIS — M7989 Other specified soft tissue disorders: Secondary | ICD-10-CM

## 2019-01-13 DIAGNOSIS — I82409 Acute embolism and thrombosis of unspecified deep veins of unspecified lower extremity: Secondary | ICD-10-CM | POA: Diagnosis not present

## 2019-01-13 DIAGNOSIS — I82422 Acute embolism and thrombosis of left iliac vein: Secondary | ICD-10-CM | POA: Diagnosis present

## 2019-01-13 DIAGNOSIS — J441 Chronic obstructive pulmonary disease with (acute) exacerbation: Secondary | ICD-10-CM | POA: Diagnosis not present

## 2019-01-13 HISTORY — PX: VENA CAVA FILTER PLACEMENT: SHX1085

## 2019-01-13 LAB — TYPE AND SCREEN
ABO/RH(D): O POS
Antibody Screen: NEGATIVE

## 2019-01-13 LAB — COMPREHENSIVE METABOLIC PANEL
ALT: 15 U/L (ref 0–44)
AST: 19 U/L (ref 15–41)
Albumin: 3.5 g/dL (ref 3.5–5.0)
Alkaline Phosphatase: 62 U/L (ref 38–126)
Anion gap: 12 (ref 5–15)
BUN: 24 mg/dL — ABNORMAL HIGH (ref 8–23)
CO2: 24 mmol/L (ref 22–32)
Calcium: 8.5 mg/dL — ABNORMAL LOW (ref 8.9–10.3)
Chloride: 102 mmol/L (ref 98–111)
Creatinine, Ser: 1.02 mg/dL — ABNORMAL HIGH (ref 0.44–1.00)
GFR calc Af Amer: >60 mL/min (ref >60)
GFR calc non Af Amer: 54 mL/min — ABNORMAL LOW (ref >60)
Glucose, Bld: 180 mg/dL — ABNORMAL HIGH (ref 70–99)
Potassium: 3.8 mmol/L (ref 3.5–5.1)
Sodium: 138 mmol/L (ref 135–145)
Total Bilirubin: 2.7 mg/dL — ABNORMAL HIGH (ref 0.3–1.2)
Total Protein: 6.4 g/dL — ABNORMAL LOW (ref 6.5–8.1)

## 2019-01-13 LAB — CBC
HCT: 44.8 % (ref 36.0–46.0)
Hemoglobin: 13.9 g/dL (ref 12.0–15.0)
MCH: 30 pg (ref 26.0–34.0)
MCHC: 31 g/dL (ref 30.0–36.0)
MCV: 96.6 fL (ref 80.0–100.0)
Platelets: 362 10*3/uL (ref 150–400)
RBC: 4.64 MIL/uL (ref 3.87–5.11)
RDW: 13.9 % (ref 11.5–15.5)
WBC: 16.8 10*3/uL — ABNORMAL HIGH (ref 4.0–10.5)
nRBC: 0 % (ref 0.0–0.2)

## 2019-01-13 LAB — PROTIME-INR
INR: 1.1 (ref 0.8–1.2)
Prothrombin Time: 13.8 seconds (ref 11.4–15.2)

## 2019-01-13 LAB — ABO/RH: ABO/RH(D): O POS

## 2019-01-13 LAB — APTT: aPTT: 20 seconds — ABNORMAL LOW (ref 24–36)

## 2019-01-13 LAB — SARS CORONAVIRUS 2 BY RT PCR (HOSPITAL ORDER, PERFORMED IN ~~LOC~~ HOSPITAL LAB): SARS Coronavirus 2: NEGATIVE

## 2019-01-13 SURGERY — INSERTION, FILTER, VENA CAVA
Anesthesia: General

## 2019-01-13 MED ORDER — HYDROMORPHONE HCL 1 MG/ML IJ SOLN
0.5000 mg | Freq: Once | INTRAMUSCULAR | Status: AC
  Administered 2019-01-13: 11:00:00 0.5 mg via INTRAVENOUS
  Filled 2019-01-13: qty 1

## 2019-01-13 MED ORDER — SODIUM CHLORIDE 0.9 % IV SOLN
INTRAVENOUS | Status: DC | PRN
  Administered 2019-01-13: 500 mL

## 2019-01-13 MED ORDER — SODIUM CHLORIDE 0.9 % IV BOLUS
1000.0000 mL | Freq: Once | INTRAVENOUS | Status: AC
  Administered 2019-01-13: 1000 mL via INTRAVENOUS

## 2019-01-13 MED ORDER — LORAZEPAM 1 MG PO TABS: 0.5000 mg | ORAL_TABLET | Freq: Every day | ORAL | Status: DC

## 2019-01-13 MED ORDER — LACTATED RINGERS IV SOLN
INTRAVENOUS | Status: DC | PRN
  Administered 2019-01-13: 21:00:00 via INTRAVENOUS

## 2019-01-13 MED ORDER — UMECLIDINIUM BROMIDE 62.5 MCG/INH IN AEPB
1.0000 | INHALATION_SPRAY | Freq: Every day | RESPIRATORY_TRACT | Status: DC
  Administered 2019-01-14 – 2019-01-16 (×3): 1 via RESPIRATORY_TRACT
  Filled 2019-01-13: qty 7

## 2019-01-13 MED ORDER — SODIUM CHLORIDE 0.9 % IV SOLN
INTRAVENOUS | Status: AC
  Filled 2019-01-13: qty 1.2

## 2019-01-13 MED ORDER — ALBUTEROL SULFATE (2.5 MG/3ML) 0.083% IN NEBU: 2.5000 mg | INHALATION_SOLUTION | Freq: Four times a day (QID) | RESPIRATORY_TRACT | Status: DC | PRN

## 2019-01-13 MED ORDER — SODIUM CHLORIDE 0.9% FLUSH
3.0000 mL | Freq: Two times a day (BID) | INTRAVENOUS | Status: DC
  Administered 2019-01-14 – 2019-01-16 (×4): 3 mL via INTRAVENOUS

## 2019-01-13 MED ORDER — MORPHINE SULFATE (PF) 4 MG/ML IV SOLN
4.0000 mg | Freq: Once | INTRAVENOUS | Status: AC
  Administered 2019-01-13: 4 mg via INTRAVENOUS
  Filled 2019-01-13: qty 1

## 2019-01-13 MED ORDER — ONDANSETRON HCL 4 MG/2ML IJ SOLN: 4.0000 mg | Freq: Once | INTRAMUSCULAR | Status: DC | PRN

## 2019-01-13 MED ORDER — MIDAZOLAM HCL 5 MG/5ML IJ SOLN
INTRAMUSCULAR | Status: DC | PRN
  Administered 2019-01-13 (×2): 1 mg via INTRAVENOUS

## 2019-01-13 MED ORDER — ONDANSETRON HCL 4 MG PO TABS: 4.0000 mg | ORAL_TABLET | Freq: Four times a day (QID) | ORAL | Status: DC | PRN

## 2019-01-13 MED ORDER — LORATADINE 10 MG PO TABS
10.0000 mg | ORAL_TABLET | Freq: Every day | ORAL | Status: DC
  Administered 2019-01-14 – 2019-01-16 (×3): 10 mg via ORAL
  Filled 2019-01-13 (×3): qty 1

## 2019-01-13 MED ORDER — ONDANSETRON HCL 4 MG/2ML IJ SOLN
4.0000 mg | Freq: Once | INTRAMUSCULAR | Status: AC
  Administered 2019-01-13: 09:00:00 4 mg via INTRAVENOUS
  Filled 2019-01-13: qty 2

## 2019-01-13 MED ORDER — OXYCODONE HCL 5 MG/5ML PO SOLN: 5.0000 mg | Freq: Once | ORAL | Status: DC | PRN

## 2019-01-13 MED ORDER — SODIUM CHLORIDE 0.9% FLUSH: 3.0000 mL | INTRAVENOUS | Status: DC | PRN

## 2019-01-13 MED ORDER — MORPHINE SULFATE (PF) 2 MG/ML IV SOLN
2.0000 mg | INTRAVENOUS | Status: DC | PRN
  Administered 2019-01-13 – 2019-01-15 (×3): 2 mg via INTRAVENOUS
  Filled 2019-01-13 (×3): qty 1

## 2019-01-13 MED ORDER — MIDAZOLAM HCL 2 MG/2ML IJ SOLN
INTRAMUSCULAR | Status: AC
  Filled 2019-01-13: qty 2

## 2019-01-13 MED ORDER — ROCURONIUM BROMIDE 10 MG/ML (PF) SYRINGE
PREFILLED_SYRINGE | INTRAVENOUS | Status: AC
  Filled 2019-01-13: qty 10

## 2019-01-13 MED ORDER — ALBUTEROL SULFATE HFA 108 (90 BASE) MCG/ACT IN AERS
2.0000 | INHALATION_SPRAY | Freq: Four times a day (QID) | RESPIRATORY_TRACT | Status: DC | PRN
  Filled 2019-01-13: qty 6.7

## 2019-01-13 MED ORDER — IOHEXOL 350 MG/ML SOLN
100.0000 mL | Freq: Once | INTRAVENOUS | Status: AC | PRN
  Administered 2019-01-13: 100 mL via INTRAVENOUS

## 2019-01-13 MED ORDER — MOMETASONE FURO-FORMOTEROL FUM 200-5 MCG/ACT IN AERO
2.0000 | INHALATION_SPRAY | Freq: Two times a day (BID) | RESPIRATORY_TRACT | Status: DC
  Administered 2019-01-14 – 2019-01-16 (×4): 2 via RESPIRATORY_TRACT
  Filled 2019-01-13 (×2): qty 8.8

## 2019-01-13 MED ORDER — SODIUM CHLORIDE 0.9 % IV SOLN: 250.0000 mL | INTRAVENOUS | Status: DC | PRN

## 2019-01-13 MED ORDER — PANTOPRAZOLE SODIUM 40 MG PO TBEC
40.0000 mg | DELAYED_RELEASE_TABLET | Freq: Every day | ORAL | Status: DC
  Administered 2019-01-14 – 2019-01-16 (×3): 40 mg via ORAL
  Filled 2019-01-13 (×3): qty 1

## 2019-01-13 MED ORDER — LIDOCAINE HCL 1 % IJ SOLN
INTRAMUSCULAR | Status: AC
  Filled 2019-01-13: qty 40

## 2019-01-13 MED ORDER — LIDOCAINE HCL (CARDIAC) PF 100 MG/5ML IV SOSY
PREFILLED_SYRINGE | INTRAVENOUS | Status: DC | PRN
  Administered 2019-01-13: 60 mg via INTRATRACHEAL

## 2019-01-13 MED ORDER — SODIUM CHLORIDE (PF) 0.9 % IJ SOLN
INTRAVENOUS | Status: DC | PRN
  Administered 2019-01-13: 5 mL via INTRAMUSCULAR

## 2019-01-13 MED ORDER — HYDROCODONE-ACETAMINOPHEN 7.5-325 MG PO TABS
1.0000 | ORAL_TABLET | ORAL | Status: DC | PRN
  Administered 2019-01-14 – 2019-01-16 (×7): 1 via ORAL
  Filled 2019-01-13 (×7): qty 1

## 2019-01-13 MED ORDER — OXYCODONE HCL 5 MG PO TABS: 5.0000 mg | ORAL_TABLET | Freq: Once | ORAL | Status: DC | PRN

## 2019-01-13 MED ORDER — ONDANSETRON HCL 4 MG/2ML IJ SOLN: 4.0000 mg | Freq: Four times a day (QID) | INTRAMUSCULAR | Status: DC | PRN

## 2019-01-13 MED ORDER — LIDOCAINE HCL (PF) 1 % IJ SOLN
INTRAMUSCULAR | Status: DC | PRN
  Administered 2019-01-13: 5 mL

## 2019-01-13 MED ORDER — ALBUTEROL SULFATE (2.5 MG/3ML) 0.083% IN NEBU
2.5000 mg | INHALATION_SOLUTION | Freq: Four times a day (QID) | RESPIRATORY_TRACT | Status: DC
  Administered 2019-01-13 – 2019-01-14 (×2): 2.5 mg via RESPIRATORY_TRACT
  Filled 2019-01-13 (×2): qty 3

## 2019-01-13 MED ORDER — PANTOPRAZOLE SODIUM 40 MG PO TBEC: 40.0000 mg | DELAYED_RELEASE_TABLET | Freq: Every day | ORAL | Status: DC

## 2019-01-13 MED ORDER — TRAZODONE HCL 100 MG PO TABS
100.0000 mg | ORAL_TABLET | Freq: Every day | ORAL | Status: DC
  Administered 2019-01-14 – 2019-01-15 (×2): 100 mg via ORAL
  Filled 2019-01-13 (×2): qty 1

## 2019-01-13 MED ORDER — LORAZEPAM 0.5 MG PO TABS
0.5000 mg | ORAL_TABLET | Freq: Every day | ORAL | Status: DC
  Administered 2019-01-13 – 2019-01-15 (×3): 0.5 mg via ORAL
  Filled 2019-01-13 (×3): qty 1

## 2019-01-13 MED ORDER — ONDANSETRON HCL 4 MG/2ML IJ SOLN
INTRAMUSCULAR | Status: AC
  Filled 2019-01-13: qty 2

## 2019-01-13 MED ORDER — 0.9 % SODIUM CHLORIDE (POUR BTL) OPTIME
TOPICAL | Status: DC | PRN
  Administered 2019-01-13: 1000 mL

## 2019-01-13 MED ORDER — FENTANYL CITRATE (PF) 100 MCG/2ML IJ SOLN: 25.0000 ug | INTRAMUSCULAR | Status: DC | PRN

## 2019-01-13 MED ORDER — FENTANYL CITRATE (PF) 250 MCG/5ML IJ SOLN
INTRAMUSCULAR | Status: DC | PRN
  Administered 2019-01-13 (×3): 50 ug via INTRAVENOUS

## 2019-01-13 MED ORDER — HYDROMORPHONE HCL 1 MG/ML IJ SOLN
0.5000 mg | Freq: Once | INTRAMUSCULAR | Status: AC
  Administered 2019-01-13: 0.5 mg via INTRAVENOUS
  Filled 2019-01-13: qty 1

## 2019-01-13 MED ORDER — PROPOFOL 10 MG/ML IV BOLUS
INTRAVENOUS | Status: DC | PRN
  Administered 2019-01-13: 140 mg via INTRAVENOUS

## 2019-01-13 MED ORDER — LACTATED RINGERS IV SOLN
INTRAVENOUS | Status: DC
  Administered 2019-01-13: 19:00:00 via INTRAVENOUS

## 2019-01-13 MED ORDER — FENTANYL CITRATE (PF) 250 MCG/5ML IJ SOLN
INTRAMUSCULAR | Status: AC
  Filled 2019-01-13: qty 5

## 2019-01-13 SURGICAL SUPPLY — 42 items
ADH SKN CLS APL DERMABOND .7 (GAUZE/BANDAGES/DRESSINGS) ×1
BAG DECANTER FOR FLEXI CONT (MISCELLANEOUS) ×3 IMPLANT
BLADE SURG 11 STRL SS (BLADE) ×3 IMPLANT
COVER BACK TABLE 60X90IN (DRAPES) ×3 IMPLANT
COVER PROBE W GEL 5X96 (DRAPES) ×3 IMPLANT
COVER WAND RF STERILE (DRAPES) ×1 IMPLANT
DECANTER SPIKE VIAL GLASS SM (MISCELLANEOUS) ×1 IMPLANT
DERMABOND ADVANCED (GAUZE/BANDAGES/DRESSINGS) ×2
DERMABOND ADVANCED .7 DNX12 (GAUZE/BANDAGES/DRESSINGS) ×1 IMPLANT
DRAPE C-ARM 42X72 X-RAY (DRAPES) ×1 IMPLANT
DRAPE LAPAROTOMY T 102X78X121 (DRAPES) ×3 IMPLANT
FILTER VC CELECT-FEMORAL (Filter) ×2 IMPLANT
GAUZE 4X4 16PLY RFD (DISPOSABLE) ×3 IMPLANT
GAUZE SPONGE 4X4 12PLY STRL (GAUZE/BANDAGES/DRESSINGS) ×1 IMPLANT
GLOVE BIO SURGEON STRL SZ7.5 (GLOVE) ×3 IMPLANT
GLOVE BIOGEL PI IND STRL 7.5 (GLOVE) IMPLANT
GLOVE BIOGEL PI IND STRL 8 (GLOVE) ×1 IMPLANT
GLOVE BIOGEL PI INDICATOR 7.5 (GLOVE) ×2
GLOVE BIOGEL PI INDICATOR 8 (GLOVE) ×2
GLOVE INDICATOR 7.0 STRL GRN (GLOVE) ×2 IMPLANT
GLOVE SURG SS PI 7.5 STRL IVOR (GLOVE) ×2 IMPLANT
GOWN STRL REUS W/ TWL LRG LVL3 (GOWN DISPOSABLE) ×2 IMPLANT
GOWN STRL REUS W/TWL LRG LVL3 (GOWN DISPOSABLE) ×6
KIT BASIN OR (CUSTOM PROCEDURE TRAY) ×3 IMPLANT
KIT TURNOVER KIT B (KITS) ×3 IMPLANT
NDL HYPO 25GX1X1/2 BEV (NEEDLE) ×1 IMPLANT
NDL PERC 18GX7CM (NEEDLE) ×1 IMPLANT
NEEDLE HYPO 25GX1X1/2 BEV (NEEDLE) ×3 IMPLANT
NEEDLE PERC 18GX7CM (NEEDLE) IMPLANT
NS IRRIG 1000ML POUR BTL (IV SOLUTION) ×3 IMPLANT
PACK SURGICAL SETUP 50X90 (CUSTOM PROCEDURE TRAY) ×3 IMPLANT
PAD ARMBOARD 7.5X6 YLW CONV (MISCELLANEOUS) ×2 IMPLANT
SUT VICRYL 4-0 PS2 18IN ABS (SUTURE) ×1 IMPLANT
SYR 10ML LL (SYRINGE) ×2 IMPLANT
SYR 20ML ECCENTRIC (SYRINGE) ×2 IMPLANT
SYR 30ML LL (SYRINGE) ×1 IMPLANT
SYR 5ML LL (SYRINGE) ×3 IMPLANT
SYR CONTROL 10ML LL (SYRINGE) ×3 IMPLANT
TOWEL GREEN STERILE (TOWEL DISPOSABLE) ×3 IMPLANT
WATER STERILE IRR 1000ML POUR (IV SOLUTION) ×3 IMPLANT
WIRE BENTSON .035X145CM (WIRE) ×2 IMPLANT
WIRE J 3MM .035X145CM (WIRE) ×1 IMPLANT

## 2019-01-13 NOTE — Progress Notes (Signed)
Left lower extremity venous duplex and IVC/Iliac duplex has been completed. Preliminary results can be found in CV Proc through chart review.  Results were given to [name].  01/13/19 4:16 PM Carlos Levering RVT

## 2019-01-13 NOTE — Anesthesia Preprocedure Evaluation (Addendum)
Anesthesia Evaluation  Patient identified by MRN, date of birth, ID band Patient awake    Reviewed: Allergy & Precautions, NPO status , Patient's Chart, lab work & pertinent test results  Airway Mallampati: II  TM Distance: >3 FB Neck ROM: Full    Dental  (+) Teeth Intact, Dental Advisory Given   Pulmonary former smoker,    breath sounds clear to auscultation       Cardiovascular  Rhythm:Regular Rate:Normal     Neuro/Psych    GI/Hepatic   Endo/Other    Renal/GU      Musculoskeletal   Abdominal   Peds  Hematology   Anesthesia Other Findings   Reproductive/Obstetrics                             Anesthesia Physical Anesthesia Plan  ASA: III  Anesthesia Plan: General   Post-op Pain Management:    Induction: Intravenous  PONV Risk Score and Plan: Ondansetron and Dexamethasone  Airway Management Planned: LMA  Additional Equipment:   Intra-op Plan:   Post-operative Plan:   Informed Consent: I have reviewed the patients History and Physical, chart, labs and discussed the procedure including the risks, benefits and alternatives for the proposed anesthesia with the patient or authorized representative who has indicated his/her understanding and acceptance.   Dental advisory given  Plan Discussed with: CRNA and Anesthesiologist  Anesthesia Plan Comments:         Anesthesia Quick Evaluation  

## 2019-01-13 NOTE — ED Notes (Signed)
Plz call son [name] with update on admission.  Phone number is in contacts

## 2019-01-13 NOTE — Anesthesia Procedure Notes (Signed)
Procedure Name: LMA Insertion Date/Time: 01/13/2019 9:26 PM Performed by: Clovis Cao, CRNA Pre-anesthesia Checklist: Patient identified, Emergency Drugs available, Suction available, Patient being monitored and Timeout performed Patient Re-evaluated:Patient Re-evaluated prior to induction Oxygen Delivery Method: Circle system utilized Preoxygenation: Pre-oxygenation with 100% oxygen Induction Type: IV induction Ventilation: Mask ventilation without difficulty LMA: LMA inserted LMA Size: 4.0 Tube type: Oral Number of attempts: 1 Placement Confirmation: positive ETCO2 and breath sounds checked- equal and bilateral Tube secured with: Tape Dental Injury: Teeth and Oropharynx as per pre-operative assessment

## 2019-01-13 NOTE — H&P (Addendum)
[facility]                                                                                    Patient Demographics  [name], is a 78 y.o. female  CSN: [id number]  MRN: [id number]  DOB - 1940-12-31  Admit Date - 01/13/2019  Outpatient Primary MD for the patient is Deland Pretty, MD   With History of -  Past Medical History:  Diagnosis Date  . Allergic rhinitis   . Asthma   . Chronic airway obstruction, not elsewhere classified   . Depressive disorder, not elsewhere classified   . Esophageal reflux       Past Surgical History:  Procedure Laterality Date  . ABDOMINAL HYSTERECTOMY    . CYSTOSCOPY    . NASAL SINUS SURGERY      in for   Chief Complaint  Patient presents with  . Back Pain     HPI  [name]  is a 78 y.o. female, with 3 days history of swelling of her left lower extremity.  Patient was evaluated by ultrasound at [facility] on 01/10/2019 and was negative for DVT and was thought to be due to venous stasis.  Today the patient started having lower back pain, groin pain with worsening leg pain and swelling.  Patient denies any chest pains, fever chills, nausea or vomiting.  Patient is not on any blood thinners at home.  Patient denies any history of trauma Work-up in the emergency room showed left retroperitoneal bleed with left common iliac vein internal and external iliac veins, common femoral vein and popliteal vein DVT.   vascular surgery evaluated patient in ER, ordered duplex scan to evaluate DVTs Blood work was significant for an elevated white blood cell count and glucose .    Review of Systems    In addition to the HPI above,  No Fever-chills, No Headache, No changes with Vision or hearing, No problems swallowing food or Liquids, No Chest pain, Cough or Shortness of Breath, No Abdominal pain, No Nausea or Vommitting, Bowel movements are regular, No Blood in stool or Urine, No dysuria, No new skin rashes or  bruises, No new weakness, tingling, numbness in any extremity, No recent weight gain or loss, No polyuria, polydypsia or polyphagia, No significant Mental Stressors.  All systems were reviewed and were negative.   Social History Social History   Tobacco Use  . Smoking status: Former Smoker    Packs/day: 0.25    Years: 40.00    Pack years: 10.00    Types: Cigarettes    Quit date: 05/27/2013    Years since quitting: 5.6  . Smokeless tobacco: Never Used  Substance Use Topics  . Alcohol use: No     Family History Family History  Problem Relation Age of Onset  . Diabetes Mother   . Coronary artery disease Mother   . Emphysema Father   . Coronary artery disease Brother      Prior to Admission medications   Medication Sig Start Date End Date Taking? Authorizing Provider  albuterol (PROVENTIL) (2.5 MG/3ML) 0.083% nebulizer solution Take 3 mLs (2.5 mg total) by nebulization every  6 (six) hours. 09/24/14  Yes Young, Tarri Fuller D, MD  albuterol (VENTOLIN HFA) 108 (90 Base) MCG/ACT inhaler Inhale 2 puffs into the lungs every 6 (six) hours as needed for wheezing or shortness of breath. 12/26/18  Yes Young, Tarri Fuller D, MD  cephALEXin (KEFLEX) 250 MG capsule Take 250 mg by mouth daily. 12/04/18  Yes [provider]  cetirizine (ZYRTEC) 10 MG tablet Take 10 mg by mouth daily.   Yes [provider]  Fluticasone-Salmeterol (ADVAIR) 250-50 MCG/DOSE AEPB INHALE 1 DOSE BY MOUTH TWICE DAILY, RINSE MOUTH AFTER USE Patient taking differently: Inhale 1 puff into the lungs 2 (two) times daily.  09/09/18  Yes Young, Tarri Fuller D, MD  Glycopyrrolate-Formoterol (BEVESPI AEROSPHERE) 9-4.8 MCG/ACT AERO Inhale 2 puffs into the lungs 2 (two) times a day. 12/26/18  Yes Young, Tarri Fuller D, MD  LORazepam (ATIVAN) 1 MG tablet Take 0.5-1 mg by mouth at bedtime.   Yes [provider]  omeprazole (PRILOSEC) 20 MG capsule Take 1 tablet by mouth 2 (two) times daily before a meal.  09/13/11  Yes  [provider]  traZODone (DESYREL) 100 MG tablet Take 100 mg by mouth at bedtime.    Yes [provider]  SPIRIVA RESPIMAT 2.5 MCG/ACT AERS Inhale 1 puff into the lungs daily.  06/01/16   [provider]    Allergies  Allergen Reactions  . Clarithromycin Swelling  . Penicillins Swelling    Tongue swelling Did it involve swelling of the face/tongue/throat, SOB, or low BP? Yes Did it involve sudden or severe rash/hives, skin peeling, or any reaction on the inside of your mouth or nose? Yes Did you need to seek medical attention at a hospital or doctor's office? Yes When did it last happen?childhood If all above answers are "NO", may proceed with cephalosporin use.   . Pneumococcal Vaccines Other (See Comments)    Couldn't breathe  . Sulfonamide Derivatives Swelling  . Estradiol Rash    Cream gives her a rash    Physical Exam  Vitals  Blood pressure 109/78, pulse (!) 106, temperature 97.6 F (36.4 C), temperature source Oral, resp. rate 18, height 5\' 6"  (1.676 m), weight 76.2 kg, SpO2 94 %.   General appearance, no acute distress, very pleasant, well-developed HEENT no jaundice or pallor, no facial deviation, no oral thrush Neck supple, no neck vein distention Chest clear and resonant Heart normal S1-S2, no murmurs gallops or rubs Abdomen soft, nontender, bowel sounds present Extremities left leg erythema and swelling, significant Skin multiple ecchymosis and scratches on both lower extremities Neuro gross nonfocal, patient moving all extremities  Data Review  CBC Recent Labs  Lab 01/13/19 0912  WBC 16.8*  HGB 13.9  HCT 44.8  PLT 362  MCV 96.6  MCH 30.0  MCHC 31.0  RDW 13.9   ------------------------------------------------------------------------------------------------------------------  Chemistries  Recent Labs  Lab 01/13/19 0912  NA 138  K 3.8  CL 102  CO2 24  GLUCOSE 180*  BUN 24*  CREATININE 1.02*  CALCIUM 8.5*  AST  19  ALT 15  ALKPHOS 62  BILITOT 2.7*   ------------------------------------------------------------------------------------------------------------------ estimated creatinine clearance is 49.7 mL/min (A) (by C-G formula based on SCr of 1.02 mg/dL (H)). ------------------------------------------------------------------------------------------------------------------ No results for input(s): TSH, T4TOTAL, T3FREE, THYROIDAB in the last 72 hours.  Invalid input(s): FREET3   Coagulation profile No results for input(s): INR, PROTIME in the last 168 hours. ------------------------------------------------------------------------------------------------------------------- No results for input(s): DDIMER in the last 72 hours. -------------------------------------------------------------------------------------------------------------------  Cardiac Enzymes No results for  input(s): CKMB, TROPONINI, MYOGLOBIN in the last 168 hours.  Invalid input(s): CK ------------------------------------------------------------------------------------------------------------------ Invalid input(s): POCBNP   ---------------------------------------------------------------------------------------------------------------  Urinalysis No results found for: COLORURINE, APPEARANCEUR, [place], [place], [place], [place], BILIRUBINUR, KETONESUR, PROTEINUR, UROBILINOGEN, NITRITE, LEUKOCYTESUR  ----------------------------------------------------------------------------------------------------------------  Imaging results:   Dg Chest 2 View  Result Date: 12/27/2018 CLINICAL DATA:  Follow-up COPD EXAM: CHEST - 2 VIEW COMPARISON:  06/22/2016 FINDINGS: Cardiac shadow is stable. Lungs are mildly hyperinflated but stable. No focal infiltrate or sizable effusion is seen. No acute bony abnormality is noted. IMPRESSION: COPD without acute abnormality. Electronically Signed   By: Inez Catalina M.D.   On: 12/27/2018 07:20   Dg  Lumbar Spine Complete  Result Date: 01/13/2019 CLINICAL DATA:  LEFT leg swelling, LEFT lower flank pain radiating to leg. No known injury. EXAM: LUMBAR SPINE - COMPLETE 4+ VIEW COMPARISON:  None. FINDINGS: Mild dextroscoliosis. No evidence of acute vertebral body subluxation. No acute or suspicious osseous lesion. No fracture line or displaced fracture fragment seen. Mild degenerative spondylosis throughout the lumbar spine, with associated mild disc space narrowings and mild osseous spurring. Additional degenerative facet hypertrophy within the lower lumbar spine causing at least mild osseous neural foramen narrowing. Aortic atherosclerosis. Visualized paravertebral soft tissues are otherwise unremarkable. IMPRESSION: 1. No acute findings. 2. Mild degenerative change, as detailed above. If symptoms could be considered radiculopathic, would consider nonemergent lumbar spine MRI to exclude associated nerve root impingement. 3. Aortic atherosclerosis. Electronically Signed   By: Franki Cabot M.D.   On: 01/13/2019 10:00   Ct Angio Ao+bifem W & Or Wo Contrast  Result Date: 01/13/2019 CLINICAL DATA:  78 year old female with a history of leg edema, left lower extremity swelling, flank pain EXAM: CT ANGIOGRAPHY OF ABDOMINAL AORTA WITH ILIOFEMORAL RUNOFF TECHNIQUE: Multidetector CT imaging of the abdomen, pelvis and lower extremities was performed using the standard protocol during bolus administration of intravenous contrast. Multiplanar CT image reconstructions and MIPs were obtained to evaluate the vascular anatomy. CONTRAST:  145mL OMNIPAQUE IOHEXOL 350 MG/ML SOLN COMPARISON:  Duplex performed January 10, 2019, no prior CT imaging of the abdomen FINDINGS: VASCULAR Aorta: Mild atherosclerotic changes of the abdominal aorta. No dissection or aneurysm. Celiac: Mild atherosclerosis at the origin of the celiac artery. Branches are patent. SMA: Mild atherosclerotic changes at the origin of the SMA. Branches are patent.  Renals: Main right renal artery patent with mild atherosclerosis at the origin. Fusiform dilation/ectasia of the distal main renal artery which involves the division. Greatest diameter measured is 7 mm. Mild atherosclerosis at the origin of the left renal artery with no high-grade stenosis. IMA: IMA remains patent. RIGHT Lower Extremity Mild atherosclerotic changes of the right common iliac artery without high-grade stenosis or occlusion. Hypogastric artery is patent as well as the anterior and posterior division. External iliac artery patent with no significant atherosclerotic changes. High bifurcation of the common femoral artery near the inguinal ligament. Profunda femoris patent as well as patent thigh branches. Minimal atherosclerosis of the superficial femoral artery. Popliteal arteries patent. Trifurcation is patent with no significant atherosclerosis of the tibial arteries. Attenuation of the contrast bolus at the ankle secondary to late arrival. LEFT Lower Extremity Mild atherosclerotic changes of the left common iliac artery. No dissection or aneurysm. No high-grade stenosis or occlusion. Hypogastric arteries patent. External iliac artery patent with minimal atherosclerotic changes. Common femoral artery patent with minimal atherosclerosis. Profunda femoris patent as well as thigh branches. Trace atherosclerotic changes of the left superficial femoral artery. Popliteal artery is patent. Trifurcation  is patent with minimal atherosclerosis of the tibial arteries. Decreased attenuation at the ankle likely secondary to late arrival of the contrast. Veins: There is asymmetric expansion of the left common iliac vein, external iliac vein, pelvic veins compared to the right. Surrounding inflammatory changes. Visualized left iliac veins on the delayed phase/venous imaging confirms thrombus extending through the confluence of the iliac veins. There is asymmetric expansion of the left common femoral vein and femoral  vein with surrounding edema. Asymmetric appearance of the popliteal vein on the left compared to the right with hyperdense appearance of the veins as they extend into the left tibial veins. The left lower extremity was not imaged on the delayed phase venous imaging. Review of the MIP images confirms the above findings. NON-VASCULAR Lower chest: Incomplete imaging of bilateral breast reconstruction/augmentation. No acute finding of the lower chest with atelectasis/scarring. Nodules at the left lung base measured 2 mm-3 mm, new from the comparison CT of August 27, 2014 in a region of scarring. Hepatobiliary: Unremarkable.  Unremarkable gallbladder. Pancreas: Unremarkable Spleen: Unremarkable Adrenals/Urinary Tract: Unremarkable appearance of the bilateral adrenal glands. Right kidney unremarkable with no hydronephrosis or nephrolithiasis. Left kidney has been anteriorly displaced secondary to retroperitoneal hemorrhage. No evidence of active hemorrhage of the left kidney. There is a low-density fluid cyst at the inferior collecting system which measures 5.9 cm compatible with Bosniak 1 cyst. No hydronephrosis or nephrolithiasis. Stomach/Bowel: Unremarkable stomach with small hiatal hernia. Unremarkable small bowel. No abnormal distension no transition point. Normal appendix. Colonic diverticula without associated inflammatory changes. Lymphatic: No lymphadenopathy. Reproductive: Hysterectomy. Other: Left-sided retroperitoneal hemorrhage which is inseparable from the psoas muscle. No evidence of extravasated contrast to indicate bleeding/hemorrhage. The largest component is near the L3 level estimated 8.3 cm in diameter. The source is not identified on the current CT. Fat containing umbilical hernia. Musculoskeletal: No acute displaced fracture. Degenerative changes of the spine. IMPRESSION: Left-sided retroperitoneal hemorrhage with no evidence of active extravasation. The greatest diameter is estimated 8.3 cm, with  the majority of hemorrhage inseparable from the psoas muscle, most likely a spontaneous retroperitoneal hemorrhage. CT demonstrates left-sided DVT, involving left common iliac vein, internal iliac veins, external iliac vein, common femoral vein, femoral vein, popliteal vein, and possibly tibial veins. Correlation with duplex might be useful. These above results were discussed by telephone at the time of interpretation on 01/13/2019 at 2:45 pm with Dr. Jeannie Done PFEIFFER Mild aortic and peripheral arterial disease, as above. Aortic Atherosclerosis (ICD10-I70.0). Chronic changes of the lung bases. There is a cluster of small nodules at the left lung base which is new from the comparison CT of 2016. While these most likely are inflammatory/infectious, follow-up is indicated. Non-contrast chest CT is recommended in 12 months.This recommendation follows the consensus statement: Guidelines for Management of Incidental Pulmonary Nodules Detected on CT Images: From the Fleischner Society 2017; Radiology 2017; 284:228-243. Additional ancillary findings as above. Signed, Dulcy Fanny. Dellia Nims, RPVI [facility] Radiology Electronically Signed   By: Corrie Mckusick D.O.   On: 01/13/2019 14:47   US Venous Img Lower Unilateral Left  Result Date: 01/10/2019 CLINICAL DATA:  Left lower extremity edema. EXAM: LEFT LOWER EXTREMITY VENOUS DOPPLER ULTRASOUND TECHNIQUE: Gray-scale sonography with graded compression, as well as color Doppler and duplex ultrasound were performed to evaluate the lower extremity deep venous systems from the level of the common femoral vein and including the common femoral, femoral, profunda femoral, popliteal and calf veins including the posterior tibial, peroneal and gastrocnemius  veins when visible. The superficial great saphenous vein was also interrogated. Spectral Doppler was utilized to evaluate flow at rest and with distal augmentation maneuvers in the  common femoral, femoral and popliteal veins. COMPARISON:  None. FINDINGS: Contralateral Common Femoral Vein: Respiratory phasicity is normal and symmetric with the symptomatic side. No evidence of thrombus. Normal compressibility. Common Femoral Vein: No evidence of thrombus. Normal compressibility, respiratory phasicity and response to augmentation. Saphenofemoral Junction: No evidence of thrombus. Normal compressibility and flow on color Doppler imaging. Profunda Femoral Vein: No evidence of thrombus. Normal compressibility and flow on color Doppler imaging. Femoral Vein: No evidence of thrombus. Normal compressibility, respiratory phasicity and response to augmentation. Popliteal Vein: No evidence of thrombus. Normal compressibility, respiratory phasicity and response to augmentation. Calf Veins: No evidence of thrombus. Normal compressibility and flow on color Doppler imaging. Superficial Great Saphenous Vein: No evidence of thrombus. Normal compressibility. Venous Reflux:  None. Other Findings: No evidence of superficial thrombophlebitis or abnormal fluid collection. IMPRESSION: No evidence of left lower extremity deep venous thrombosis. Electronically Signed   By: Aletta Edouard M.D.   On: 01/10/2019 15:58      Assessment & Plan  Left leg DVT by CT of abdomen and distal runoff Evaluated by vascular surgery Venous Dopplers of left lower extremity confirmed the presence of DVTs Patient will be having an IVC filter placed today due to risk of anticoagulation in the presence of left-sided retroperitoneal bleed  Spontaneous retroperitoneal bleed Will observe for right now.  No surgical intervention is needed  COPD Continue with neb treatments  DVT Prophylaxis SCD to right leg  AM Labs Ordered, also please review Full Orders  Family Communication: Discussed with son at bedside  Code Status full  Disposition Plan: Home  Time spent in minutes : 41 minutes  Condition  GUARDED   @SIGNATURE @

## 2019-01-13 NOTE — ED Notes (Signed)
Pt sinus tach on monitor

## 2019-01-13 NOTE — ED Notes (Signed)
Patient transported to X-ray 

## 2019-01-13 NOTE — ED Triage Notes (Signed)
Per [facility] pt coming from home c/o left sided back pain into her groin with leg swelling onset of Friday. Patient seen at [facility] 3 days ago.

## 2019-01-13 NOTE — ED Notes (Signed)
Placed pt on 2L 02 per [place]

## 2019-01-13 NOTE — Op Note (Signed)
    Patient name: [name] MRN: [id number] DOB: Oct 30, 1940 Sex: female  01/13/2019 Pre-operative Diagnosis: Extensive left lower extremity DVT with left retroperitoneal hematoma Post-operative diagnosis:  Same Surgeon:  Erlene Quan C. Donzetta Matters, MD Procedure Performed: 1.  Ultrasound-guided cannulation right common femoral vein 2.  IVC venogram 3.  Placement of Cook celect filter in infrarenal position  Indications: 78 year old female presented with what appeared to be spontaneous left retroperitoneal hematoma.  Same time she had swelling of her left lower extremity found to have extensive left lower extremity DVT.  Given that she has contraindication anticoagulation at this time she was indicated for IVC filter placement.  Findings: IVC was approximately 20 mm in diameter.  Filter was placed infrarenal position.   Procedure:  The patient was identified in the holding area and taken to the operating room where she is placed supine operative general anesthesia induced.  She was to the prepped draped the right groin usual fashion timeout was called.  Ultrasound was used to identify the common femoral vein which is noted be patent and compressible.  This cannulated micropuncture needle followed wire and sheath.  A Bentson wire was placed centrally.  The tract was dilated.  Filter sheath was placed above the confluence of the iliac veins and venogram performed.  Renal veins were marked on the screen.  Filter was brought to the table and deployed in the deployed well.  Sheath was removed and pressure held for 10 minutes until hemostasis obtained.  Contrast: 5 cc   Bellatrix Devonshire C. Donzetta Matters, MD [facility] [place] Office: [phone] Pager: [phone]

## 2019-01-13 NOTE — Transfer of Care (Signed)
Immediate Anesthesia Transfer of Care Note  Patient: [name]  Procedure(s) Performed: INSERTION VENA-CAVA FILTER (N/A )  Patient Location: PACU  Anesthesia Type:General  Level of Consciousness: awake  Airway & Oxygen Therapy: Patient Spontanous Breathing and Patient connected to nasal cannula oxygen  Post-op Assessment: Report given to RN and Post -op Vital signs reviewed and stable  Post vital signs: Reviewed and stable  Last Vitals:  Vitals Value Taken Time  BP 137/82 01/13/19 2155  Temp    Pulse 44 01/13/19 2155  Resp 17 01/13/19 2157  SpO2 70 % 01/13/19 2155  Vitals shown include unvalidated device data.  Last Pain:  Vitals:   01/13/19 1823  TempSrc:   PainSc: 6          Complications: No apparent anesthesia complications

## 2019-01-13 NOTE — ED Notes (Signed)
Report given to Sam, RN in short stay. 

## 2019-01-13 NOTE — ED Notes (Signed)
ED Provider at bedside. 

## 2019-01-13 NOTE — Anesthesia Postprocedure Evaluation (Signed)
Anesthesia Post Note  Patient: [name]  Procedure(s) Performed: INSERTION VENA-CAVA FILTER (N/A )     Patient location during evaluation: PACU Anesthesia Type: General Level of consciousness: awake and alert Pain management: pain level controlled Vital Signs Assessment: post-procedure vital signs reviewed and stable Respiratory status: spontaneous breathing, nonlabored ventilation, respiratory function stable and patient connected to nasal cannula oxygen Cardiovascular status: blood pressure returned to baseline and stable Postop Assessment: no apparent nausea or vomiting Anesthetic complications: no    Last Vitals:  Vitals:   01/13/19 2155 01/13/19 2210  BP: 137/82 (!) 142/86  Pulse: (!) 44 (!) 121  Resp: (!) 22 (!) 26  Temp: 37.3 C   SpO2: 99% 96%    Last Pain:  Vitals:   01/13/19 2155  TempSrc:   PainSc: 0-No pain                 [name] COKER

## 2019-01-13 NOTE — Consult Note (Signed)
ED Consult    Reason for Consult:  Retroperitoneal hematoma with dvt Referring Physician:  ED MRN #:  [id number]  History of Present Illness: This is a 78 y.o. female began having left lower extremity swelling and left-sided abdominal pain on Friday.  She underwent DVT study on Friday which was negative.  She now presents to the emergency department with progressive pain.  Has not been able to eat in the past couple days.  Specifically has been n.p.o. all day.  Does not take blood thinners.  Has never had blood clot in the past does not have any family history of blood clot.  States she is never had pain like this before.  Only previous abdominal surgery includes hysterectomy.  Past Medical History:  Diagnosis Date  . Allergic rhinitis   . Asthma   . Chronic airway obstruction, not elsewhere classified   . Depressive disorder, not elsewhere classified   . Esophageal reflux     Past Surgical History:  Procedure Laterality Date  . ABDOMINAL HYSTERECTOMY    . CYSTOSCOPY    . NASAL SINUS SURGERY      Allergies  Allergen Reactions  . Clarithromycin Swelling  . Penicillins Swelling    Tongue swelling Did it involve swelling of the face/tongue/throat, SOB, or low BP? Yes Did it involve sudden or severe rash/hives, skin peeling, or any reaction on the inside of your mouth or nose? Yes Did you need to seek medical attention at a hospital or doctor's office? Yes When did it last happen?childhood If all above answers are "NO", may proceed with cephalosporin use.   . Pneumococcal Vaccines Other (See Comments)    Couldn't breathe  . Sulfonamide Derivatives Swelling  . Estradiol Rash    Cream gives her a rash    Prior to Admission medications   Medication Sig Start Date End Date Taking? Authorizing Provider  albuterol (PROVENTIL) (2.5 MG/3ML) 0.083% nebulizer solution Take 3 mLs (2.5 mg total) by nebulization every 6 (six) hours. 09/24/14  Yes Young, Tarri Fuller D, MD  albuterol  (VENTOLIN HFA) 108 (90 Base) MCG/ACT inhaler Inhale 2 puffs into the lungs every 6 (six) hours as needed for wheezing or shortness of breath. 12/26/18  Yes Young, Tarri Fuller D, MD  cephALEXin (KEFLEX) 250 MG capsule Take 250 mg by mouth daily. 12/04/18  Yes [provider]  cetirizine (ZYRTEC) 10 MG tablet Take 10 mg by mouth daily.   Yes [provider]  Fluticasone-Salmeterol (ADVAIR) 250-50 MCG/DOSE AEPB INHALE 1 DOSE BY MOUTH TWICE DAILY, RINSE MOUTH AFTER USE Patient taking differently: Inhale 1 puff into the lungs 2 (two) times daily.  09/09/18  Yes Young, Tarri Fuller D, MD  Glycopyrrolate-Formoterol (BEVESPI AEROSPHERE) 9-4.8 MCG/ACT AERO Inhale 2 puffs into the lungs 2 (two) times a day. 12/26/18  Yes Young, Tarri Fuller D, MD  LORazepam (ATIVAN) 1 MG tablet Take 0.5-1 mg by mouth at bedtime.   Yes [provider]  omeprazole (PRILOSEC) 20 MG capsule Take 1 tablet by mouth 2 (two) times daily before a meal.  09/13/11  Yes [provider]  traZODone (DESYREL) 100 MG tablet Take 100 mg by mouth at bedtime.    Yes [provider]  SPIRIVA RESPIMAT 2.5 MCG/ACT AERS Inhale 1 puff into the lungs daily.  06/01/16   [provider]    Social History   Socioeconomic History  . Marital status: Divorced    Spouse name: Not on file  . Number of children: Not on file  . Years  of education: Not on file  . Highest education level: Not on file  Occupational History  . Occupation: Disabled  Social Needs  . Financial resource strain: Not on file  . Food insecurity    Worry: Not on file    Inability: Not on file  . Transportation needs    Medical: Not on file    Non-medical: Not on file  Tobacco Use  . Smoking status: Former Smoker    Packs/day: 0.25    Years: 40.00    Pack years: 10.00    Types: Cigarettes    Quit date: 05/27/2013    Years since quitting: 5.6  . Smokeless tobacco: Never Used  Substance and Sexual Activity  . Alcohol use: No  . Drug  use: No  . Sexual activity: Not on file  Lifestyle  . Physical activity    Days per week: Not on file    Minutes per session: Not on file  . Stress: Not on file  Relationships  . Social Herbalist on phone: Not on file    Gets together: Not on file    Attends religious service: Not on file    Active member of club or organization: Not on file    Attends meetings of clubs or organizations: Not on file    Relationship status: Not on file  . Intimate partner violence    Fear of current or ex partner: Not on file    Emotionally abused: Not on file    Physically abused: Not on file    Forced sexual activity: Not on file  Other Topics Concern  . Not on file  Social History Narrative  . Not on file    Family History  Problem Relation Age of Onset  . Diabetes Mother   . Coronary artery disease Mother   . Emphysema Father   . Coronary artery disease Brother     ROS:   Cardiovascular: []  chest pain/pressure []  palpitations []  SOB lying flat []  DOE []  pain in legs while walking []  pain in legs at rest []  pain in legs at night []  non-healing ulcers []  hx of DVT [x]  swelling in legs  Pulmonary: []  productive cough []  asthma/wheezing []  home O2  Neurologic: []  weakness in []  arms []  legs []  numbness in []  arms []  legs []  hx of CVA []  mini stroke [] difficulty speaking or slurred speech []  temporary loss of vision in one eye []  dizziness  Hematologic: []  hx of cancer []  bleeding problems []  problems with blood clotting easily  Endocrine:   []  diabetes []  thyroid disease  GI [x]  abdominal pain []  blood in stool  GU: []  CKD/renal failure []  HD--[]  M/W/F or []  T/T/S []  burning with urination []  blood in urine  Psychiatric: []  anxiety []  depression  Musculoskeletal: []  arthritis []  joint pain  Integumentary: []  rashes []  ulcers  Constitutional: []  fever []  chills   Physical Examination  Vitals:   01/13/19 1230 01/13/19 1417  BP:  103/69 100/81  Pulse: 100 (!) 108  Resp: 17 (!) 29  Temp:    SpO2: 95% 95%   Body mass index is 27.12 kg/m.  General:  nad HENT: WNL, normocephalic Pulmonary: normal non-labored breathing Cardiac: Palpable femoral pulses bilaterally Abdomen: Soft she does have left-sided tenderness to palpation, no bruising externally Extremities: Left lower extremity is approximately 50% greater in size than right without any pitting edema Musculoskeletal: no muscle wasting or atrophy  Neurologic: A&O X 3; Appropriate Affect ;  SENSATION: normal; MOTOR FUNCTION:  moving all extremities equally. Speech is fluent/normal   CBC    Component Value Date/Time   WBC 16.8 (H) 01/13/2019 0912   RBC 4.64 01/13/2019 0912   HGB 13.9 01/13/2019 0912   HCT 44.8 01/13/2019 0912   PLT 362 01/13/2019 0912   MCV 96.6 01/13/2019 0912   MCH 30.0 01/13/2019 0912   MCHC 31.0 01/13/2019 0912   RDW 13.9 01/13/2019 0912   LYMPHSABS 0.8 05/27/2013 1437   MONOABS 0.9 05/27/2013 1437   EOSABS 0.0 05/27/2013 1437   BASOSABS 0.0 05/27/2013 1437    BMET    Component Value Date/Time   NA 138 01/13/2019 0912   K 3.8 01/13/2019 0912   CL 102 01/13/2019 0912   CO2 24 01/13/2019 0912   GLUCOSE 180 (H) 01/13/2019 0912   BUN 24 (H) 01/13/2019 0912   CREATININE 1.02 (H) 01/13/2019 0912   CALCIUM 8.5 (L) 01/13/2019 0912   GFRNONAA 54 (L) 01/13/2019 0912   GFRAA >60 01/13/2019 0912    COAGS: Lab Results  Component Value Date   INR 0.9 07/18/2007     Non-Invasive Vascular Imaging:    CT  IMPRESSION: Left-sided retroperitoneal hemorrhage with no evidence of active extravasation. The greatest diameter is estimated 8.3 cm, with the majority of hemorrhage inseparable from the psoas muscle, most likely a spontaneous retroperitoneal hemorrhage.  CT demonstrates left-sided DVT, involving left common iliac vein, internal iliac veins, external iliac vein, common femoral vein, femoral vein, popliteal vein, and  possibly tibial veins. Correlation with duplex might be useful.  These above results were discussed by telephone at the time of interpretation on 01/13/2019 at 2:45 pm with Dr. Jeannie Done PFEIFFER  Mild aortic and peripheral arterial disease, as above. Aortic Atherosclerosis (ICD10-I70.0).  Chronic changes of the lung bases. There is a cluster of small nodules at the left lung base which is new from the comparison CT of 2016. While these most likely are inflammatory/infectious, follow-up is indicated. Non-contrast chest CT is recommended in 12 months.This recommendation follows the consensus statement: Guidelines for Management of Incidental Pulmonary Nodules Detected on CT Images: From the Fleischner Society 2017; Radiology 2017; 284:228-243.   IVC and iliac and left lower extremity DVT study I have independently interpreted her IVC iliac and left lower extremity duplex which demonstrates thrombus from the common iliac vein down through the tibial veins on the left.  ASSESSMENT/PLAN: This is a 78 y.o. female with left-sided retroperitoneal hematoma that appears to be spontaneous with associated extensive left lower extremity DVT.  Given hematoma not a good candidate for anticoagulation at this time and would not intervene on DVT without the ability to anticoagulate.  We will plan on IVC filter tonight.  Can later revisit anticoagulation and intervention of her extensive left lower extremity DVT if her swelling does not improve.    Akiyah Eppolito C. Donzetta Matters, MD [facility] Office: [phone] Pager: [phone]

## 2019-01-13 NOTE — ED Provider Notes (Signed)
[facility] EMERGENCY DEPARTMENT Provider Note   CSN: [id number] Arrival date & time: 01/13/19  0857     History   Chief Complaint Chief Complaint  Patient presents with   Back Pain    HPI [name] is a 78 y.o. female.     Patient presents with chief complaint of left-sided lower back pain, groin pain, leg heaviness and leg pain and swelling.  Patient developed swelling in her left leg 3 days ago.  She saw her primary care doctor who ordered an ultrasound.  She was evaluated at [facility] on 01/10/2019.  DVT study was negative and pain was thought to be related to venous stasis changes.  At that time she was not having any significant back pain or leg pain.  Pain has progressively become worse and severe.  She states that her leg "feels heavy".  Denies numbness or tingling.  Onset of symptoms acute.  No treatments prior to arrival.  Nothing makes symptoms better.     Past Medical History:  Diagnosis Date   Allergic rhinitis    Asthma    Chronic airway obstruction, not elsewhere classified    Depressive disorder, not elsewhere classified    Esophageal reflux     Patient Active Problem List   Diagnosis Date Noted   CAP (community acquired pneumonia) 05/27/2013   SOB (shortness of breath) 05/27/2013   Myalgia 05/27/2013   COPD with acute exacerbation ([place]) 05/27/2013   Acute respiratory failure with hypoxia ([place]) 05/27/2013   Vertebral compression fracture ([place]) 09/15/2010   SCIATICA 12/16/2007   COPD mixed type ([place]) 07/17/2007   ALLERGIC RHINITIS 07/16/2007   BRONCHITIS 07/16/2007   ASTHMA 07/16/2007   Esophageal reflux 07/16/2007    Past Surgical History:  Procedure Laterality Date   ABDOMINAL HYSTERECTOMY     CYSTOSCOPY     NASAL SINUS SURGERY       OB History   No obstetric history on file.      Home Medications    Prior to Admission medications   Medication Sig Start Date End Date Taking?  Authorizing Provider  albuterol (PROVENTIL) (2.5 MG/3ML) 0.083% nebulizer solution Take 3 mLs (2.5 mg total) by nebulization every 6 (six) hours. 09/24/14   Deneise Lever, MD  albuterol (VENTOLIN HFA) 108 (90 Base) MCG/ACT inhaler Inhale 2 puffs into the lungs every 6 (six) hours as needed for wheezing or shortness of breath. 12/26/18   Baird Lyons D, MD  Fluticasone-Salmeterol (ADVAIR) 250-50 MCG/DOSE AEPB INHALE 1 DOSE BY MOUTH TWICE DAILY, RINSE MOUTH AFTER USE 09/09/18   Baird Lyons D, MD  Glycopyrrolate-Formoterol (BEVESPI AEROSPHERE) 9-4.8 MCG/ACT AERO Inhale 2 puffs into the lungs 2 (two) times a day. 12/26/18   Deneise Lever, MD  LORazepam (ATIVAN) 1 MG tablet Take 0.5-1 mg by mouth at bedtime.    [provider]  omeprazole (PRILOSEC) 20 MG capsule Take 1 tablet by mouth 2 (two) times daily before a meal.  09/13/11   [provider]  simvastatin (ZOCOR) 20 MG tablet Take 20 mg by mouth at bedtime. Reported on 06/23/2015    [provider]  SPIRIVA RESPIMAT 2.5 MCG/ACT AERS 1 puff daily.  06/01/16   [provider]  traZODone (DESYREL) 100 MG tablet Take 100 mg by mouth at bedtime.     [provider]    Family History Family History  Problem Relation Age of Onset   Diabetes Mother    Coronary artery disease  Mother    Emphysema Father    Coronary artery disease Brother     Social History Social History   Tobacco Use   Smoking status: Former Smoker    Packs/day: 0.25    Years: 40.00    Pack years: 10.00    Types: Cigarettes    Quit date: 05/27/2013    Years since quitting: 5.6   Smokeless tobacco: Never Used  Substance Use Topics   Alcohol use: No   Drug use: No     Allergies   Clarithromycin, Penicillins, Pneumococcal vaccines, Sulfonamide derivatives, and Estradiol   Review of Systems Review of Systems  Constitutional: Negative for fever.  HENT: Negative for rhinorrhea and sore throat.   Eyes: Negative  for redness.  Respiratory: Negative for cough.   Cardiovascular: Positive for leg swelling. Negative for chest pain.  Gastrointestinal: Negative for abdominal pain, diarrhea, nausea and vomiting.  Genitourinary: Negative for dysuria.  Musculoskeletal: Positive for back pain and myalgias.  Skin: Positive for color change. Negative for rash.  Neurological: Negative for headaches.     Physical Exam Updated Vital Signs BP 111/80 (BP Location: Right Arm)    Pulse (!) 104    Temp 97.6 F (36.4 C) (Oral)    Resp 20    Ht 5\' 6"  (1.676 m)    Wt 76.2 kg    SpO2 96%    BMI 27.12 kg/m   Physical Exam Vitals signs and nursing note reviewed.  Constitutional:      General: She is in acute distress.     Appearance: She is well-developed.  HENT:     Head: Normocephalic and atraumatic.  Eyes:     General:        Right eye: No discharge.        Left eye: No discharge.     Conjunctiva/sclera: Conjunctivae normal.  Neck:     Musculoskeletal: Normal range of motion and neck supple.  Cardiovascular:     Rate and Rhythm: Normal rate and regular rhythm.     Pulses:          Dorsalis pedis pulses are 2+ on the right side and 2+ on the left side.     Heart sounds: Normal heart sounds.  Pulmonary:     Effort: Pulmonary effort is normal.     Breath sounds: Normal breath sounds.  Abdominal:     Palpations: Abdomen is soft.     Tenderness: There is no abdominal tenderness.  Musculoskeletal: Normal range of motion.        General: Swelling present.     Left hip: Normal.     Left knee: Normal.     Left ankle: Normal.     Cervical back: She exhibits normal range of motion, no tenderness and no bony tenderness.     Thoracic back: She exhibits normal range of motion, no tenderness and no bony tenderness.     Lumbar back: She exhibits tenderness (Left-sided). She exhibits normal range of motion and no bony tenderness.       Back:     Left upper leg: She exhibits swelling.     Left lower leg: She  exhibits swelling. She exhibits no tenderness.     Comments: No step-off noted with palpation of spine.   Skin:    General: Skin is warm and dry.     Findings: No rash.  Neurological:     Mental Status: She is alert.     Sensory: No sensory deficit.  Deep Tendon Reflexes: Reflexes are normal and symmetric.     Comments: 5/5 strength in entire lower extremities bilaterally. No sensation deficit.       ED Treatments / Results  Labs (all labs ordered are listed, but only abnormal results are displayed) Labs Reviewed  COMPREHENSIVE METABOLIC PANEL - Abnormal; Notable for the following components:      Result Value   Glucose, Bld 180 (*)    BUN 24 (*)    Creatinine, Ser 1.02 (*)    Calcium 8.5 (*)    Total Protein 6.4 (*)    Total Bilirubin 2.7 (*)    GFR calc non Af Amer 54 (*)    All other components within normal limits  CBC - Abnormal; Notable for the following components:   WBC 16.8 (*)    All other components within normal limits  SARS CORONAVIRUS 2 (HOSPITAL ORDER, [facility])  PROTIME-INR  APTT  TYPE AND SCREEN    EKG None  Radiology Dg Lumbar Spine Complete  Result Date: 01/13/2019 CLINICAL DATA:  LEFT leg swelling, LEFT lower flank pain radiating to leg. No known injury. EXAM: LUMBAR SPINE - COMPLETE 4+ VIEW COMPARISON:  None. FINDINGS: Mild dextroscoliosis. No evidence of acute vertebral body subluxation. No acute or suspicious osseous lesion. No fracture line or displaced fracture fragment seen. Mild degenerative spondylosis throughout the lumbar spine, with associated mild disc space narrowings and mild osseous spurring. Additional degenerative facet hypertrophy within the lower lumbar spine causing at least mild osseous neural foramen narrowing. Aortic atherosclerosis. Visualized paravertebral soft tissues are otherwise unremarkable. IMPRESSION: 1. No acute findings. 2. Mild degenerative change, as detailed above. If symptoms could be  considered radiculopathic, would consider nonemergent lumbar spine MRI to exclude associated nerve root impingement. 3. Aortic atherosclerosis. Electronically Signed   By: Franki Cabot M.D.   On: 01/13/2019 10:00   Ct Angio Ao+bifem W & Or Wo Contrast  Result Date: 01/13/2019 CLINICAL DATA:  78 year old female with a history of leg edema, left lower extremity swelling, flank pain EXAM: CT ANGIOGRAPHY OF ABDOMINAL AORTA WITH ILIOFEMORAL RUNOFF TECHNIQUE: Multidetector CT imaging of the abdomen, pelvis and lower extremities was performed using the standard protocol during bolus administration of intravenous contrast. Multiplanar CT image reconstructions and MIPs were obtained to evaluate the vascular anatomy. CONTRAST:  144mL OMNIPAQUE IOHEXOL 350 MG/ML SOLN COMPARISON:  Duplex performed January 10, 2019, no prior CT imaging of the abdomen FINDINGS: VASCULAR Aorta: Mild atherosclerotic changes of the abdominal aorta. No dissection or aneurysm. Celiac: Mild atherosclerosis at the origin of the celiac artery. Branches are patent. SMA: Mild atherosclerotic changes at the origin of the SMA. Branches are patent. Renals: Main right renal artery patent with mild atherosclerosis at the origin. Fusiform dilation/ectasia of the distal main renal artery which involves the division. Greatest diameter measured is 7 mm. Mild atherosclerosis at the origin of the left renal artery with no high-grade stenosis. IMA: IMA remains patent. RIGHT Lower Extremity Mild atherosclerotic changes of the right common iliac artery without high-grade stenosis or occlusion. Hypogastric artery is patent as well as the anterior and posterior division. External iliac artery patent with no significant atherosclerotic changes. High bifurcation of the common femoral artery near the inguinal ligament. Profunda femoris patent as well as patent thigh branches. Minimal atherosclerosis of the superficial femoral artery. Popliteal arteries patent.  Trifurcation is patent with no significant atherosclerosis of the tibial arteries. Attenuation of the contrast bolus at the ankle secondary to late  arrival. LEFT Lower Extremity Mild atherosclerotic changes of the left common iliac artery. No dissection or aneurysm. No high-grade stenosis or occlusion. Hypogastric arteries patent. External iliac artery patent with minimal atherosclerotic changes. Common femoral artery patent with minimal atherosclerosis. Profunda femoris patent as well as thigh branches. Trace atherosclerotic changes of the left superficial femoral artery. Popliteal artery is patent. Trifurcation is patent with minimal atherosclerosis of the tibial arteries. Decreased attenuation at the ankle likely secondary to late arrival of the contrast. Veins: There is asymmetric expansion of the left common iliac vein, external iliac vein, pelvic veins compared to the right. Surrounding inflammatory changes. Visualized left iliac veins on the delayed phase/venous imaging confirms thrombus extending through the confluence of the iliac veins. There is asymmetric expansion of the left common femoral vein and femoral vein with surrounding edema. Asymmetric appearance of the popliteal vein on the left compared to the right with hyperdense appearance of the veins as they extend into the left tibial veins. The left lower extremity was not imaged on the delayed phase venous imaging. Review of the MIP images confirms the above findings. NON-VASCULAR Lower chest: Incomplete imaging of bilateral breast reconstruction/augmentation. No acute finding of the lower chest with atelectasis/scarring. Nodules at the left lung base measured 2 mm-3 mm, new from the comparison CT of August 27, 2014 in a region of scarring. Hepatobiliary: Unremarkable.  Unremarkable gallbladder. Pancreas: Unremarkable Spleen: Unremarkable Adrenals/Urinary Tract: Unremarkable appearance of the bilateral adrenal glands. Right kidney unremarkable with no  hydronephrosis or nephrolithiasis. Left kidney has been anteriorly displaced secondary to retroperitoneal hemorrhage. No evidence of active hemorrhage of the left kidney. There is a low-density fluid cyst at the inferior collecting system which measures 5.9 cm compatible with Bosniak 1 cyst. No hydronephrosis or nephrolithiasis. Stomach/Bowel: Unremarkable stomach with small hiatal hernia. Unremarkable small bowel. No abnormal distension no transition point. Normal appendix. Colonic diverticula without associated inflammatory changes. Lymphatic: No lymphadenopathy. Reproductive: Hysterectomy. Other: Left-sided retroperitoneal hemorrhage which is inseparable from the psoas muscle. No evidence of extravasated contrast to indicate bleeding/hemorrhage. The largest component is near the L3 level estimated 8.3 cm in diameter. The source is not identified on the current CT. Fat containing umbilical hernia. Musculoskeletal: No acute displaced fracture. Degenerative changes of the spine. IMPRESSION: Left-sided retroperitoneal hemorrhage with no evidence of active extravasation. The greatest diameter is estimated 8.3 cm, with the majority of hemorrhage inseparable from the psoas muscle, most likely a spontaneous retroperitoneal hemorrhage. CT demonstrates left-sided DVT, involving left common iliac vein, internal iliac veins, external iliac vein, common femoral vein, femoral vein, popliteal vein, and possibly tibial veins. Correlation with duplex might be useful. These above results were discussed by telephone at the time of interpretation on 01/13/2019 at 2:45 pm with Dr. Jeannie Done PFEIFFER Mild aortic and peripheral arterial disease, as above. Aortic Atherosclerosis (ICD10-I70.0). Chronic changes of the lung bases. There is a cluster of small nodules at the left lung base which is new from the comparison CT of 2016. While these most likely are inflammatory/infectious, follow-up is indicated. Non-contrast chest CT is recommended  in 12 months.This recommendation follows the consensus statement: Guidelines for Management of Incidental Pulmonary Nodules Detected on CT Images: From the Fleischner Society 2017; Radiology 2017; 284:228-243. Additional ancillary findings as above. Signed, Dulcy Fanny. Dellia Nims, RPVI [facility] Electronically Signed   By: Corrie Mckusick D.O.   On: 01/13/2019 14:47    Procedures Procedures (including critical care time)  Medications Ordered in ED Medications  HYDROmorphone (DILAUDID) injection 0.5 mg (has no administration in time range)  morphine 4 MG/ML injection 4 mg (4 mg Intravenous Given 01/13/19 0926)  ondansetron (ZOFRAN) injection 4 mg (4 mg Intravenous Given 01/13/19 0926)  sodium chloride 0.9 % bolus 1,000 mL (0 mLs Intravenous Stopped 01/13/19 1200)  HYDROmorphone (DILAUDID) injection 0.5 mg (0.5 mg Intravenous Given 01/13/19 1118)  iohexol (OMNIPAQUE) 350 MG/ML injection 100 mL (100 mLs Intravenous Contrast Given 01/13/19 1257)     Initial Impression / Assessment and Plan / ED Course  I have reviewed the triage vital signs and the nursing notes.  Pertinent labs & imaging results that were available during my care of the patient were reviewed by me and considered in my medical decision making (see chart for details).        Patient seen and examined. Work-up initiated. Medications ordered.  Discussed with Dr. Johnney Killian who will see.  Vital signs reviewed and are as follows: BP 111/80 (BP Location: Right Arm)    Pulse (!) 104    Temp 97.6 F (36.4 C) (Oral)    Resp 20    Ht 5\' 6"  (1.676 m)    Wt 76.2 kg    SpO2 96%    BMI 27.12 kg/m   Patient with recent negative DVT study.  Patient does have palpable pulses that are symmetric in both feet.  She has mild edema of her left leg and her calf feels somewhat tight when compared to the opposite side.   10:52 AM Pt seen earlier by Dr. Johnney Killian. CT angio ordered. Plain films  show degenerative disease in back. No compression fractures.   3:09 PM patient rechecked and additional pain medication ordered in the interim.  Patient with findings consistent with spontaneous retroperitoneal bleeding on CT imaging with possible DVT.  Patient updated.  She is requesting something to eat.  Discussed that she needs to remain n.p.o.  Additional pain medication ordered.  Added PT/INR and PTT as well as type and screen.  COVID testing pending.  I spoke with [name] of vascular surgery.  He is not certain that the patient has a DVT after looking at the CT imaging.  He requests IVC/iliac duplex and reimaging of lower extremity with ultrasound to confirm DVT.  If positive, she may need a filter later today or tomorrow.  I spoke with Dr. Laren Everts of [facility] who will admit.   CRITICAL CARE Performed by: Carlisle Cater PA-C Total critical care time: 40 minutes Critical care time was exclusive of separately billable procedures and treating other patients. Critical care was necessary to treat or prevent imminent or life-threatening deterioration. Critical care was time spent personally by me on the following activities: development of treatment plan with patient and/or surrogate as well as nursing, discussions with consultants, evaluation of patient's response to treatment, examination of patient, obtaining history from patient or surrogate, ordering and performing treatments and interventions, ordering and review of laboratory studies, ordering and review of radiographic studies, pulse oximetry and re-evaluation of patient's condition.   Final Clinical Impressions(s) / ED Diagnoses   Final diagnoses:  Retroperitoneal bleeding  Pain and swelling of left lower extremity  Acute kidney injury ([place])   Admit.   ED Discharge Orders    None       Carlisle Cater, Hershal Coria 01/13/19 1511    Charlesetta Shanks, MD 01/14/19 [phone]

## 2019-01-14 ENCOUNTER — Encounter (HOSPITAL_COMMUNITY): Payer: Self-pay | Admitting: Vascular Surgery

## 2019-01-14 DIAGNOSIS — R58 Hemorrhage, not elsewhere classified: Secondary | ICD-10-CM

## 2019-01-14 LAB — CBC WITH DIFFERENTIAL/PLATELET
Abs Immature Granulocytes: 0.06 10*3/uL (ref 0.00–0.07)
Basophils Absolute: 0.1 10*3/uL (ref 0.0–0.1)
Basophils Relative: 0 %
Eosinophils Absolute: 0.1 10*3/uL (ref 0.0–0.5)
Eosinophils Relative: 1 %
HCT: 34 % — ABNORMAL LOW (ref 36.0–46.0)
Hemoglobin: 10.8 g/dL — ABNORMAL LOW (ref 12.0–15.0)
Immature Granulocytes: 1 %
Lymphocytes Relative: 12 %
Lymphs Abs: 1.5 10*3/uL (ref 0.7–4.0)
MCH: 30.2 pg (ref 26.0–34.0)
MCHC: 31.8 g/dL (ref 30.0–36.0)
MCV: 95 fL (ref 80.0–100.0)
Monocytes Absolute: 1.2 10*3/uL — ABNORMAL HIGH (ref 0.1–1.0)
Monocytes Relative: 10 %
Neutro Abs: 9.4 10*3/uL — ABNORMAL HIGH (ref 1.7–7.7)
Neutrophils Relative %: 76 %
Platelets: 252 10*3/uL (ref 150–400)
RBC: 3.58 MIL/uL — ABNORMAL LOW (ref 3.87–5.11)
RDW: 14.1 % (ref 11.5–15.5)
WBC: 12.3 10*3/uL — ABNORMAL HIGH (ref 4.0–10.5)
nRBC: 0 % (ref 0.0–0.2)

## 2019-01-14 LAB — COMPREHENSIVE METABOLIC PANEL
ALT: 14 U/L (ref 0–44)
AST: 16 U/L (ref 15–41)
Albumin: 3 g/dL — ABNORMAL LOW (ref 3.5–5.0)
Alkaline Phosphatase: 55 U/L (ref 38–126)
Anion gap: 8 (ref 5–15)
BUN: 22 mg/dL (ref 8–23)
CO2: 26 mmol/L (ref 22–32)
Calcium: 8.4 mg/dL — ABNORMAL LOW (ref 8.9–10.3)
Chloride: 104 mmol/L (ref 98–111)
Creatinine, Ser: 0.89 mg/dL (ref 0.44–1.00)
GFR calc Af Amer: >60 mL/min (ref >60)
GFR calc non Af Amer: >60 mL/min (ref >60)
Glucose, Bld: 130 mg/dL — ABNORMAL HIGH (ref 70–99)
Potassium: 3.9 mmol/L (ref 3.5–5.1)
Sodium: 138 mmol/L (ref 135–145)
Total Bilirubin: 1.5 mg/dL — ABNORMAL HIGH (ref 0.3–1.2)
Total Protein: 5.6 g/dL — ABNORMAL LOW (ref 6.5–8.1)

## 2019-01-14 MED ORDER — SODIUM CHLORIDE 0.9 % IV BOLUS
500.0000 mL | Freq: Once | INTRAVENOUS | Status: AC
  Administered 2019-01-14: 05:00:00 500 mL via INTRAVENOUS

## 2019-01-14 NOTE — Progress Notes (Signed)
  Progress Note    01/14/2019 3:28 PM 1 Day Post-Op  Subjective: Still having left flank pain, states she cannot walk because of left lower extremity swelling  Vitals:   01/14/19 0447 01/14/19 0542  BP: 122/81   Pulse: (!) 124 86  Resp: 18 18  Temp: 100.2 F (37.9 C)   SpO2: (!) 89% 91%    Physical Exam: Abdomen is soft there is tenderness to the left flank without any external bruising Left lower extremity with nonpitting edema relative to right  CBC    Component Value Date/Time   WBC 12.3 (H) 01/14/2019 0952   RBC 3.58 (L) 01/14/2019 0952   HGB 10.8 (L) 01/14/2019 0952   HCT 34.0 (L) 01/14/2019 0952   PLT 252 01/14/2019 0952   MCV 95.0 01/14/2019 0952   MCH 30.2 01/14/2019 0952   MCHC 31.8 01/14/2019 0952   RDW 14.1 01/14/2019 0952   LYMPHSABS 1.5 01/14/2019 0952   MONOABS 1.2 (H) 01/14/2019 0952   EOSABS 0.1 01/14/2019 0952   BASOSABS 0.1 01/14/2019 0952    BMET    Component Value Date/Time   NA 138 01/14/2019 0952   K 3.9 01/14/2019 0952   CL 104 01/14/2019 0952   CO2 26 01/14/2019 0952   GLUCOSE 130 (H) 01/14/2019 0952   BUN 22 01/14/2019 0952   CREATININE 0.89 01/14/2019 0952   CALCIUM 8.4 (L) 01/14/2019 0952   GFRNONAA >60 01/14/2019 0952   GFRAA >60 01/14/2019 0952    INR    Component Value Date/Time   INR 1.1 01/13/2019 1458     Intake/Output Summary (Last 24 hours) at 01/14/2019 1528 Last data filed at 01/13/2019 2138 Gross per 24 hour  Intake 500 ml  Output 5 ml  Net 495 ml     Assessment/plan:  78 y.o. female is here with left retroperitoneal hematoma with extensive left lower extremity DVT.  IVC filter was placed.  CBC recheck tomorrow can possibly start prophylactic subcutaneous heparin.  Would wait on therapeutic anticoagulation for at least 1 to 2 weeks.  Okay for TED hose and regular diet.     Brandon C. Donzetta Matters, MD [facility] [place] Office: [phone] Pager: [phone]  01/14/2019 3:28 PM

## 2019-01-14 NOTE — Progress Notes (Signed)
PROGRESS NOTE    Patient: [name]                            PCP: Deland Pretty, MD                    DOB: [birth year]            DOA: 01/13/2019 [id number]:[id number]             DOS: 01/14/2019, 11:53 AM   LOS: 1 day   Date of Service: The patient was seen and examined on 01/14/2019  Subjective:   The patient examined this morning, stable in no acute distress had ice placed around 10:00 last night.  She continues to complain of swelling her left, with mild pain discomfort. She is in distress regarding knowing that she has retroperitoneal bleed. Otherwise vital stable. No other issues overnight.  Brief Narrative:  [name]  is a 78 y.o. female, with 3 days history of swelling of her left lower extremity.  Patient was evaluated by ultrasound at [facility] on 01/10/2019 and was negative for DVT and was thought to be due to venous stasis.  Today the patient started having lower back pain, groin pain with worsening leg pain and swelling.  Patient denies any chest pains, fever chills, nausea or vomiting.  Patient is not on any blood thinners at home.  Patient denies any history of trauma Work-up in the emergency room showed left retroperitoneal bleed with left common iliac vein internal and external iliac veins, common femoral vein and popliteal vein DVT.   vascular surgery evaluated patient in ER, ordered duplex scan to evaluate DVTs Blood work was significant for an elevated white blood cell count and glucose .   Assessment & Plan:   Active Problems:   Retroperitoneal bleed   Left leg DVT by CT of abdomen and distal runoff -Vascular surgery was consulted -following closel Evaluated by vascular surgery Venous Dopplers of left lower extremity confirmed the presence of DVTs -Patient is now status post successful IVC filter placement -Given the hematoma patient is obviously not a candidate for anticoagulation.  Spontaneous left-sided retroperitoneal bleed -Monitoring, H&H  stable -Appreciating vascular input Will observe for right now.  No surgical intervention is needed  COPD Continue with neb treatments   DVT prophylaxis: SCD/Compression stockings Code Status:   Code Status: Full Code Family Communication: No family member present at bedside- attempt will be made to update daily The above findings and plan of care has been discussed with patient and family in detail,  they expressed understanding and agreement of above. Disposition Plan:   Anticipated 1-2 days Admission status:  NPATIEN -   Consultants: Vascular surgery  Procedures:     IVC placement on 01/13/2019 Procedure Performed: 1.  Ultrasound-guided cannulation right common femoral vein 2.  IVC venogram 3.  Placement of Cook celect filter in infrarenal position   Antimicrobials:  Anti-infectives (From admission, onward)   None       Medication:  . loratadine  10 mg Oral Daily  . LORazepam  0.5 mg Oral QHS  . mometasone-formoterol  2 puff Inhalation BID  . pantoprazole  40 mg Oral Daily  . sodium chloride flush  3 mL Intravenous Q12H  . traZODone  100 mg Oral QHS  . umeclidinium bromide  1 puff Inhalation Daily    sodium chloride, albuterol, HYDROcodone-acetaminophen, morphine injection, ondansetron **OR** ondansetron (ZOFRAN) IV,  sodium chloride flush   Objective:   Vitals:   01/13/19 2244 01/13/19 2307 01/14/19 0447 01/14/19 0542  BP: (!) 138/105 (!) 149/99 122/81   Pulse:  (!) 124 (!) 124 86  Resp: (!) 25 18 18 18   Temp: 97.7 F (36.5 C) 97.8 F (36.6 C) 100.2 F (37.9 C)   TempSrc:  Oral Oral   SpO2: 95% 98% (!) 89% 91%  Weight:  79 kg    Height:  5\' 6"  (1.676 m)      Intake/Output Summary (Last 24 hours) at 01/14/2019 1153 Last data filed at 01/13/2019 2138 Gross per 24 hour  Intake 1500 ml  Output 5 ml  Net 1495 ml   Filed Weights   01/13/19 0909 01/13/19 2307  Weight: 76.2 kg 79 kg     Examination:   Physical Exam  Constitution:  Alert,  cooperative, no distress,  Appears calm and comfortable  Psychiatric: Normal and stable mood and affect, cognition intact,   HEENT: Normocephalic, PERRL, otherwise with in Normal limits  Chest:Chest symmetric Cardio vascular:  S1/S2, RRR, No murmure, No Rubs or Gallops  pulmonary: Clear to auscultation bilaterally, respirations unlabored, negative wheezes / crackles Abdomen: Soft, non-tender, non-distended, bowel sounds,no masses, no organomegaly Muscular skeletal: Limited exam - in bed, able to move all 4 extremities, Normal strength,  Neuro: CNII-XII intact. , normal motor and sensation, reflexes intact  Extremities:  +2Left leg pitting edema with mild tenderness, negative edema and right lower extremities, +2 pulses bilaterally Skin: Dry, warm to touch, negative for any Rashes, No open wounds Wounds: per nursing documentation  LABs:  CBC Latest Ref Rng & Units 01/14/2019 01/13/2019 05/28/2013  WBC 4.0 - 10.5 K/uL 12.3(H) 16.8(H) 3.7(L)  Hemoglobin 12.0 - 15.0 g/dL 10.8(L) 13.9 13.7  Hematocrit 36.0 - 46.0 % 34.0(L) 44.8 42.8  Platelets 150 - 400 K/uL 252 362 235   CMP Latest Ref Rng & Units 01/14/2019 01/13/2019 05/28/2013  Glucose 70 - 99 mg/dL 130(H) 180(H) 136(H)  BUN 8 - 23 mg/dL 22 24(H) 14  Creatinine 0.44 - 1.00 mg/dL 0.89 1.02(H) 0.66  Sodium 135 - 145 mmol/L 138 138 147  Potassium 3.5 - 5.1 mmol/L 3.9 3.8 4.8  Chloride 98 - 111 mmol/L 104 102 108  CO2 22 - 32 mmol/L 26 24 28   Calcium 8.9 - 10.3 mg/dL 8.4(L) 8.5(L) 8.7  Total Protein 6.5 - 8.1 g/dL 5.6(L) 6.4(L) -  Total Bilirubin 0.3 - 1.2 mg/dL 1.5(H) 2.7(H) -  Alkaline Phos 38 - 126 U/L 55 62 -  AST 15 - 41 U/L 16 19 -  ALT 0 - 44 U/L 14 15 -     SIGNED: Deatra James, MD, FACP, FHM. [facility],  Pager [phone]  If 7PM-7AM, please contact night-coverage [email or web address] 01/14/2019, 11:53 AM

## 2019-01-15 DIAGNOSIS — I82402 Acute embolism and thrombosis of unspecified deep veins of left lower extremity: Secondary | ICD-10-CM

## 2019-01-15 LAB — CBC
HCT: 33.4 % — ABNORMAL LOW (ref 36.0–46.0)
Hemoglobin: 10.6 g/dL — ABNORMAL LOW (ref 12.0–15.0)
MCH: 30.2 pg (ref 26.0–34.0)
MCHC: 31.7 g/dL (ref 30.0–36.0)
MCV: 95.2 fL (ref 80.0–100.0)
Platelets: 268 10*3/uL (ref 150–400)
RBC: 3.51 MIL/uL — ABNORMAL LOW (ref 3.87–5.11)
RDW: 14 % (ref 11.5–15.5)
WBC: 12.6 10*3/uL — ABNORMAL HIGH (ref 4.0–10.5)
nRBC: 0 % (ref 0.0–0.2)

## 2019-01-15 LAB — BASIC METABOLIC PANEL
Anion gap: 7 (ref 5–15)
BUN: 19 mg/dL (ref 8–23)
CO2: 28 mmol/L (ref 22–32)
Calcium: 8.7 mg/dL — ABNORMAL LOW (ref 8.9–10.3)
Chloride: 101 mmol/L (ref 98–111)
Creatinine, Ser: 0.76 mg/dL (ref 0.44–1.00)
GFR calc Af Amer: >60 mL/min (ref >60)
GFR calc non Af Amer: >60 mL/min (ref >60)
Glucose, Bld: 112 mg/dL — ABNORMAL HIGH (ref 70–99)
Potassium: 4 mmol/L (ref 3.5–5.1)
Sodium: 136 mmol/L (ref 135–145)

## 2019-01-15 MED ORDER — ASPIRIN EC 81 MG PO TBEC
81.0000 mg | DELAYED_RELEASE_TABLET | Freq: Every day | ORAL | Status: DC
  Administered 2019-01-16: 81 mg via ORAL
  Filled 2019-01-15: qty 1

## 2019-01-15 NOTE — Progress Notes (Signed)
PROGRESS NOTE    Patient: [name]                            PCP: Deland Pretty, MD                    DOB: 30-Nov-1940            DOA: 01/13/2019 [id number]             DOS: 01/15/2019, 1:01 PM   LOS: 2 days   Date of Service: The patient was seen and examined on 01/15/2019  Subjective:   The patient examined this morning, stable in no acute distress had ice placed around 10:00 last night.  She continues to complain of swelling her left, with mild pain discomfort. She is in distress regarding knowing that she has retroperitoneal bleed. Otherwise vital stable. No other issues overnight.  Brief Narrative:  [name]  is a 78 y.o. female, with 3 days history of swelling of her left lower extremity.  Patient was evaluated by ultrasound at [facility] on 01/10/2019 and was negative for DVT and was thought to be due to venous stasis.    Subsequently the patient started having lower back pain, groin pain with worsening leg pain and swelling.  Work-up in the emergency room showed left retroperitoneal bleed with left common iliac vein internal and external iliac veins, common femoral vein and popliteal vein DVT. Vascular surgery evaluated patient in ER, ordered duplex scan to evaluate DVTs Blood work was significant for an elevated white blood cell count and glucose .   Assessment & Plan:    Acute DVT involving the left lower extremity  Evaluated by vascular surgery Venous Dopplers of left lower extremity confirmed the presence of DVTs Patient is now status post successful IVC filter placement Given the hematoma patient is obviously not a candidate for anticoagulation at this time.  This may be reconsidered in 1 to 2 weeks.  Aspirin has been recommended by vascular surgery.  Spontaneous left-sided retroperitoneal bleed Hemoglobin has been stable.  No need for intervention.  COPD Continue with neb treatments  Start mobilizing.  PT evaluation.  Anticipate discharge  tomorrow.   DVT prophylaxis: SCD/Compression stockings Code Status:   Code Status: Full Code Family Communication: Discussed with patient Disposition Plan:   Anticipated 1-2 days Admission status:  Inpatient  Consultants: Vascular surgery  Procedures:    IVC placement on 01/13/2019 Procedure Performed: 1.  Ultrasound-guided cannulation right common femoral vein 2.  IVC venogram 3.  Placement of Cook celect filter in infrarenal position   Antimicrobials:  Anti-infectives (From admission, onward)   None       Medication:  . loratadine  10 mg Oral Daily  . LORazepam  0.5 mg Oral QHS  . mometasone-formoterol  2 puff Inhalation BID  . pantoprazole  40 mg Oral Daily  . sodium chloride flush  3 mL Intravenous Q12H  . traZODone  100 mg Oral QHS  . umeclidinium bromide  1 puff Inhalation Daily    sodium chloride, albuterol, HYDROcodone-acetaminophen, morphine injection, ondansetron **OR** ondansetron (ZOFRAN) IV, sodium chloride flush   Objective:   Vitals:   01/14/19 1727 01/14/19 2034 01/15/19 0428 01/15/19 0804  BP: 118/70 124/85 136/78   Pulse: (!) 117 (!) 107 (!) 111   Resp: 19 18 18    Temp: 98.5 F (36.9 C) 99.8 F (37.7 C) 99.3 F (37.4 C)  TempSrc: Oral Oral Oral   SpO2: (!) 89% 93% 94% (!) 84%  Weight:      Height:        Intake/Output Summary (Last 24 hours) at 01/15/2019 1301 Last data filed at 01/14/2019 1900 Gross per 24 hour  Intake 240 ml  Output -  Net 240 ml   Filed Weights   01/13/19 0909 01/13/19 2307  Weight: 76.2 kg 79 kg     Examination:   General appearance: Awake alert.  In no distress Resp: Clear to auscultation bilaterally.  Normal effort Cardio: S1-S2 is normal regular.  No S3-S4.  No rubs murmurs or bruit GI: Abdomen is soft.  Nontender nondistended.  Bowel sounds are present normal.  No masses organomegaly Extremities: Left lower extremity noted to be swollen compared to the right.   Neurologic: Alert and oriented x3.   No focal neurological deficits.     LABs:  CBC Latest Ref Rng & Units 01/15/2019 01/14/2019 01/13/2019  WBC 4.0 - 10.5 K/uL 12.6(H) 12.3(H) 16.8(H)  Hemoglobin 12.0 - 15.0 g/dL 10.6(L) 10.8(L) 13.9  Hematocrit 36.0 - 46.0 % 33.4(L) 34.0(L) 44.8  Platelets 150 - 400 K/uL 268 252 362   CMP Latest Ref Rng & Units 01/15/2019 01/14/2019 01/13/2019  Glucose 70 - 99 mg/dL 112(H) 130(H) 180(H)  BUN 8 - 23 mg/dL 19 22 24(H)  Creatinine 0.44 - 1.00 mg/dL 0.76 0.89 1.02(H)  Sodium 135 - 145 mmol/L 136 138 138  Potassium 3.5 - 5.1 mmol/L 4.0 3.9 3.8  Chloride 98 - 111 mmol/L 101 104 102  CO2 22 - 32 mmol/L 28 26 24   Calcium 8.9 - 10.3 mg/dL 8.7(L) 8.4(L) 8.5(L)  Total Protein 6.5 - 8.1 g/dL - 5.6(L) 6.4(L)  Total Bilirubin 0.3 - 1.2 mg/dL - 1.5(H) 2.7(H)  Alkaline Phos 38 - 126 U/L - 55 62  AST 15 - 41 U/L - 16 19  ALT 0 - 44 U/L - 14 15     [name] [name],  [facility]  Pager: [email or web address], Password [facility]  01/15/2019, 1:01 PM

## 2019-01-15 NOTE — Evaluation (Signed)
Physical Therapy Evaluation & Discharge Patient Details Name: [name] MRN: [id number] DOB: 04/09/1941 Today's Date: 01/15/2019   History of Present Illness  Pt admitted on 8/17 with 3 day hx of LLE swelling with developing low back & groin pain. Pt found to have retroperitoneal bleed with acute LLE DVT's. Pt s/p IVC placement on 8/17. PMHx: asthma, depression    Clinical Impression  Patient evaluated by Physical Therapy with no further acute PT needs identified. PTA, pt indep, active and lives alone. Today, pt independent with mobility; residual LLE pain and swelling. SpO2 90% on RA with ambulation. Educ re: current condition, ted hose use, fall risk reduction strategies, return to activity recommendations, importance of mobility. All education has been completed and the patient has no further questions. Acute PT is signing off. Thank you for this referral.    Follow Up Recommendations No PT follow up    Equipment Recommendations  None recommended by PT    Recommendations for Other Services       Precautions / Restrictions Precautions Precautions: Fall Restrictions Weight Bearing Restrictions: No      Mobility  Bed Mobility Overal bed mobility: Independent                Transfers Overall transfer level: Independent Equipment used: None                Ambulation/Gait Ambulation/Gait assistance: Independent Gait Distance (Feet): 100 Feet Assistive device: None Gait Pattern/deviations: Step-through pattern;Decreased stride length;Antalgic   Gait velocity interpretation: 1.31 - 2.62 ft/sec, indicative of limited community ambulator General Gait Details: Slow, antalgic gait indep; pt c/o LLE pain but declining use of DME to offload  Stairs            Wheelchair Mobility    Modified Rankin (Stroke Patients Only)       Balance Overall balance assessment: Needs assistance   Sitting balance-Leahy Scale: Good       Standing balance-Leahy  Scale: Good               High level balance activites: Side stepping;Backward walking;Direction changes;Turns;Head turns;Sudden stops High Level Balance Comments: No overt LOB or instability with higher level balance activities             Pertinent Vitals/Pain Pain Assessment: Faces Faces Pain Scale: Hurts a little bit Pain Location: LLE Pain Descriptors / Indicators: Dull;Discomfort Pain Intervention(s): Monitored during session    Home Living Family/patient expects to be discharged to:: Private residence Living Arrangements: Alone Available Help at Discharge: Family;Available PRN/intermittently Type of Home: House Home Access: Stairs to enter Entrance Stairs-Rails: None Entrance Stairs-Number of Steps: 1-3 Home Layout: One level Home Equipment: None      Prior Function Level of Independence: Independent         Comments: Drives. Enjoys traveling, using her pool and gambling     Hand Dominance        Extremity/Trunk Assessment   Upper Extremity Assessment Upper Extremity Assessment: Overall WFL for tasks assessed    Lower Extremity Assessment Lower Extremity Assessment: Overall WFL for tasks assessed(LLE swelling s/p DVT and IVC filter placement)       Communication   Communication: No difficulties  Cognition Arousal/Alertness: Awake/alert Behavior During Therapy: WFL for tasks assessed/performed Overall Cognitive Status: Within Functional Limits for tasks assessed  General Comments General comments (skin integrity, edema, etc.): SpO2 90% on RA with mobility; HR 117. Reviewed ted hose wear and application    Exercises     Assessment/Plan    PT Assessment Patent does not need any further PT services  PT Problem List         PT Treatment Interventions      PT Goals (Current goals can be found in the Care Plan section)  Acute Rehab PT Goals PT Goal Formulation: All assessment and  education complete, DC therapy    Frequency     Barriers to discharge        Co-evaluation               AM-PAC PT "6 Clicks" Mobility  Outcome Measure Help needed turning from your back to your side while in a flat bed without using bedrails?: None Help needed moving from lying on your back to sitting on the side of a flat bed without using bedrails?: None Help needed moving to and from a bed to a chair (including a wheelchair)?: None Help needed standing up from a chair using your arms (e.g., wheelchair or bedside chair)?: None Help needed to walk in hospital room?: None Help needed climbing 3-5 steps with a railing? : None 6 Click Score: 24    End of Session   Activity Tolerance: Patient tolerated treatment well Patient left: in chair;with call bell/phone within reach;with family/visitor present Nurse Communication: Mobility status PT Visit Diagnosis: Other abnormalities of gait and mobility (R26.89);Pain Pain - Right/Left: Left Pain - part of body: Leg    Time: 6578-4696 PT Time Calculation (min) (ACUTE ONLY): 20 min   Charges:   PT Evaluation $PT Eval Moderate Complexity: 1 Mod     Mabeline Caras, PT, DPT Acute Rehabilitation Services  Pager [phone] Office [phone]  Derry Lory 01/15/2019, 4:43 PM

## 2019-01-15 NOTE — Progress Notes (Signed)
  Progress Note    01/15/2019 12:38 PM 2 Days Post-Op  Subjective: Tolerated diet, left leg pain improving  Vitals:   01/15/19 0428 01/15/19 0804  BP: 136/78   Pulse: (!) 111   Resp: 18   Temp: 99.3 F (37.4 C)   SpO2: 94% (!) 84%    Physical Exam: Awake alert oriented On the respirations Abdomen is soft there is left flank tenderness to palpation Left leg is soft approximately 20% larger than right Right groin stick site soft without hematoma  CBC    Component Value Date/Time   WBC 12.6 (H) 01/15/2019 0200   RBC 3.51 (L) 01/15/2019 0200   HGB 10.6 (L) 01/15/2019 0200   HCT 33.4 (L) 01/15/2019 0200   PLT 268 01/15/2019 0200   MCV 95.2 01/15/2019 0200   MCH 30.2 01/15/2019 0200   MCHC 31.7 01/15/2019 0200   RDW 14.0 01/15/2019 0200   LYMPHSABS 1.5 01/14/2019 0952   MONOABS 1.2 (H) 01/14/2019 0952   EOSABS 0.1 01/14/2019 0952   BASOSABS 0.1 01/14/2019 0952    BMET    Component Value Date/Time   NA 136 01/15/2019 0200   K 4.0 01/15/2019 0200   CL 101 01/15/2019 0200   CO2 28 01/15/2019 0200   GLUCOSE 112 (H) 01/15/2019 0200   BUN 19 01/15/2019 0200   CREATININE 0.76 01/15/2019 0200   CALCIUM 8.7 (L) 01/15/2019 0200   GFRNONAA >60 01/15/2019 0200   GFRAA >60 01/15/2019 0200    INR    Component Value Date/Time   INR 1.1 01/13/2019 1458     Intake/Output Summary (Last 24 hours) at 01/15/2019 1238 Last data filed at 01/14/2019 1900 Gross per 24 hour  Intake 240 ml  Output -  Net 240 ml     Assessment/plan:  78 y.o. female is here with left-sided retroperitoneal hematoma that appears spontaneous also had extensive left lower extremity DVT.  Now has IVC filter in place.  Discussed with her starting baby aspirin daily now we can follow her CBC possibly discharge tomorrow at the discretion of hospitalist.  Upon discharge we will follow her up in 2 to 3 weeks to evaluate for initiation of anticoagulation with plan to get filter out after she tolerates  and to discuss possible invention of left lower extremity if he has persistent symptoms with ambulation.    Hyacinth Marcelli C. Donzetta Matters, MD [facility] Office: [phone] Pager: [phone]  01/15/2019 12:38 PM

## 2019-01-15 NOTE — Plan of Care (Signed)
  Problem: Education: Goal: Knowledge of General Education information will improve Description: Including pain rating scale, medication(s)/side effects and non-pharmacologic comfort measures Outcome: Progressing   Problem: Clinical Measurements: Goal: Ability to maintain clinical measurements within normal limits will improve Outcome: Progressing   

## 2019-01-16 LAB — CBC
HCT: 31.3 % — ABNORMAL LOW (ref 36.0–46.0)
Hemoglobin: 10.1 g/dL — ABNORMAL LOW (ref 12.0–15.0)
MCH: 30 pg (ref 26.0–34.0)
MCHC: 32.3 g/dL (ref 30.0–36.0)
MCV: 92.9 fL (ref 80.0–100.0)
Platelets: 277 10*3/uL (ref 150–400)
RBC: 3.37 MIL/uL — ABNORMAL LOW (ref 3.87–5.11)
RDW: 13.7 % (ref 11.5–15.5)
WBC: 12.4 10*3/uL — ABNORMAL HIGH (ref 4.0–10.5)
nRBC: 0 % (ref 0.0–0.2)

## 2019-01-16 LAB — BASIC METABOLIC PANEL
Anion gap: 10 (ref 5–15)
BUN: 15 mg/dL (ref 8–23)
CO2: 26 mmol/L (ref 22–32)
Calcium: 8.5 mg/dL — ABNORMAL LOW (ref 8.9–10.3)
Chloride: 99 mmol/L (ref 98–111)
Creatinine, Ser: 0.72 mg/dL (ref 0.44–1.00)
GFR calc Af Amer: >60 mL/min (ref >60)
GFR calc non Af Amer: >60 mL/min (ref >60)
Glucose, Bld: 125 mg/dL — ABNORMAL HIGH (ref 70–99)
Potassium: 3.8 mmol/L (ref 3.5–5.1)
Sodium: 135 mmol/L (ref 135–145)

## 2019-01-16 MED ORDER — HYDROCODONE-ACETAMINOPHEN 7.5-325 MG PO TABS: 1.0000 | ORAL_TABLET | Freq: Four times a day (QID) | ORAL | 0 refills | Status: DC | PRN

## 2019-01-16 MED ORDER — ASPIRIN 81 MG PO TBEC: 81.0000 mg | DELAYED_RELEASE_TABLET | Freq: Every day | ORAL | 0 refills | Status: DC

## 2019-01-16 NOTE — Discharge Summary (Signed)
[facility]  Physician Discharge Summary   Patient ID: [name] MRN: [id number] DOB/AGE: March 03, 1941 78 y.o.  Admit date: 01/13/2019 Discharge date: 01/16/2019  PCP: Deland Pretty, MD  DISCHARGE DIAGNOSES:  Acute DVT involving left lower extremity Left-sided retroperitoneal hematoma History of COPD  RECOMMENDATIONS FOR OUTPATIENT FOLLOW UP: 1. Outpatient follow-up with vascular surgery in 2 weeks    Home Health: None Equipment/Devices: None  CODE STATUS: Full code  DISCHARGE CONDITION: fair  Diet recommendation: As before  INITIAL HISTORY: LindaEagleis a55 y.o.female,with 3 days history of swelling of her left lower extremity. Patient was evaluated by ultrasound at [facility] on 01/10/2019 and was negative for DVT and was thought to be due to venous stasis.   Subsequently the patient started having lower back pain, groin pain with worsening leg pain and swelling. Work-up in the emergency room showed left retroperitoneal bleed with left common iliac vein internal and external iliac veins, common femoral vein and popliteal vein DVT. Vascular surgery evaluated patient in ER, ordered duplex scan to evaluate DVTs.    Consultations:  Vascular surgery  Procedures: IVC placement on 01/13/2019    HOSPITAL COURSE:   Acute DVT involving the left lower extremity  Evaluated by vascular surgery. Venous Dopplers of left lower extremityconfirmed the presence of DVTs. Patient is now status post IVC filter placement. Given the retroperitoneal hematoma patient is obviously not a candidate for anticoagulation at this time.  This may be reconsidered in 1 to 2 weeks.  Aspirin has been recommended by vascular surgery.  Patient to follow-up with vascular surgery in 2 weeks at which time anticoagulation will be considered.  Spontaneous left-sided retroperitoneal bleed Hemoglobin has been stable.  No need for intervention.  COPD Stable.  Continue with home  medications.  Patient noted to become tachycardic with exertion.  No symptoms present.  Heart rate does return to normal with rest.  Overall stable.  Okay for discharge home today.  Discussed with [name] with vascular surgery.   PERTINENT LABS:  The results of significant diagnostics from this hospitalization (including imaging, microbiology, ancillary and laboratory) are listed below for reference.    Microbiology: Recent Results (from the past 240 hour(s))  SARS Coronavirus 2 [facility] order, Performed in [facility] hospital lab) Nasopharyngeal Nasopharyngeal Swab     Status: None   Collection Time: 01/13/19  2:58 PM   Specimen: Nasopharyngeal Swab  Result Value Ref Range Status   SARS Coronavirus 2 NEGATIVE NEGATIVE Final    Comment: (NOTE) If result is NEGATIVE SARS-CoV-2 target nucleic acids are NOT DETECTED. The SARS-CoV-2 RNA is generally detectable in upper and lower  respiratory specimens during the acute phase of infection. The lowest  concentration of SARS-CoV-2 viral copies this assay can detect is 250  copies / mL. A negative result does not preclude SARS-CoV-2 infection  and should not be used as the sole basis for treatment or other  patient management decisions.  A negative result may occur with  improper specimen collection / handling, submission of specimen other  than nasopharyngeal swab, presence of viral mutation(s) within the  areas targeted by this assay, and inadequate number of viral copies  (<250 copies / mL). A negative result must be combined with clinical  observations, patient history, and epidemiological information. If result is POSITIVE SARS-CoV-2 target nucleic acids are DETECTED. The SARS-CoV-2 RNA is generally detectable in upper and lower  respiratory specimens dur ing the acute phase of infection.  Positive  results are indicative  of active infection with SARS-CoV-2.  Clinical  correlation with patient history and other diagnostic  information is  necessary to determine patient infection status.  Positive results do  not rule out bacterial infection or co-infection with other viruses. If result is PRESUMPTIVE POSTIVE SARS-CoV-2 nucleic acids MAY BE PRESENT.   A presumptive positive result was obtained on the submitted specimen  and confirmed on repeat testing.  While 2019 novel coronavirus  (SARS-CoV-2) nucleic acids may be present in the submitted sample  additional confirmatory testing may be necessary for epidemiological  and / or clinical management purposes  to differentiate between  SARS-CoV-2 and other Sarbecovirus currently known to infect humans.  If clinically indicated additional testing with an alternate test  methodology [phone]) is advised. The SARS-CoV-2 RNA is generally  detectable in upper and lower respiratory sp ecimens during the acute  phase of infection. The expected result is Negative. Fact Sheet for Patients:  StrictlyIdeas.no Fact Sheet for Healthcare Providers: BankingDealers.co.za This test is not yet approved or cleared by the [place] FDA and has been authorized for detection and/or diagnosis of SARS-CoV-2 by FDA under an Emergency Use Authorization (EUA).  This EUA will remain in effect (meaning this test can be used) for the duration of the COVID-19 declaration under Section 564(b)(1) of the Act, 21 U.S.C. section 360bbb-3(b)(1), unless the authorization is terminated or revoked sooner. Performed at [facility], [place] [address] [place], [place] [address]      Labs: Basic Metabolic Panel: Recent Labs  Lab 01/13/19 0912 01/14/19 0952 01/15/19 0200 01/16/19 0321  NA 138 138 136 135  K 3.8 3.9 4.0 3.8  CL 102 104 101 99  CO2 24 26 28 26   GLUCOSE 180* 130* 112* 125*  BUN 24* 22 19 15   CREATININE 1.02* 0.89 0.76 0.72  CALCIUM 8.5* 8.4* 8.7* 8.5*   Liver Function Tests: Recent Labs  Lab 01/13/19 0912  01/14/19 0952  AST 19 16  ALT 15 14  ALKPHOS 62 55  BILITOT 2.7* 1.5*  PROT 6.4* 5.6*  ALBUMIN 3.5 3.0*   CBC: Recent Labs  Lab 01/13/19 0912 01/14/19 0952 01/15/19 0200 01/16/19 0321  WBC 16.8* 12.3* 12.6* 12.4*  NEUTROABS  --  9.4*  --   --   HGB 13.9 10.8* 10.6* 10.1*  HCT 44.8 34.0* 33.4* 31.3*  MCV 96.6 95.0 95.2 92.9  PLT 362 252 268 277     IMAGING STUDIES Dg Chest 2 View  Result Date: 12/27/2018 CLINICAL DATA:  Follow-up COPD EXAM: CHEST - 2 VIEW COMPARISON:  06/22/2016 FINDINGS: Cardiac shadow is stable. Lungs are mildly hyperinflated but stable. No focal infiltrate or sizable effusion is seen. No acute bony abnormality is noted. IMPRESSION: COPD without acute abnormality. Electronically Signed   By: Inez Catalina M.D.   On: 12/27/2018 07:20   Dg Lumbar Spine Complete  Result Date: 01/13/2019 CLINICAL DATA:  LEFT leg swelling, LEFT lower flank pain radiating to leg. No known injury. EXAM: LUMBAR SPINE - COMPLETE 4+ VIEW COMPARISON:  None. FINDINGS: Mild dextroscoliosis. No evidence of acute vertebral body subluxation. No acute or suspicious osseous lesion. No fracture line or displaced fracture fragment seen. Mild degenerative spondylosis throughout the lumbar spine, with associated mild disc space narrowings and mild osseous spurring. Additional degenerative facet hypertrophy within the lower lumbar spine causing at least mild osseous neural foramen narrowing. Aortic atherosclerosis. Visualized paravertebral soft tissues are otherwise unremarkable. IMPRESSION: 1. No acute findings. 2. Mild degenerative change, as detailed above.  If symptoms could be considered radiculopathic, would consider nonemergent lumbar spine MRI to exclude associated nerve root impingement. 3. Aortic atherosclerosis. Electronically Signed   By: Franki Cabot M.D.   On: 01/13/2019 10:00   Ct Angio Ao+bifem W & Or Wo Contrast  Result Date: 01/13/2019 CLINICAL DATA:  78 year old female with a history  of leg edema, left lower extremity swelling, flank pain EXAM: CT ANGIOGRAPHY OF ABDOMINAL AORTA WITH ILIOFEMORAL RUNOFF TECHNIQUE: Multidetector CT imaging of the abdomen, pelvis and lower extremities was performed using the standard protocol during bolus administration of intravenous contrast. Multiplanar CT image reconstructions and MIPs were obtained to evaluate the vascular anatomy. CONTRAST:  123mL OMNIPAQUE IOHEXOL 350 MG/ML SOLN COMPARISON:  Duplex performed January 10, 2019, no prior CT imaging of the abdomen FINDINGS: VASCULAR Aorta: Mild atherosclerotic changes of the abdominal aorta. No dissection or aneurysm. Celiac: Mild atherosclerosis at the origin of the celiac artery. Branches are patent. SMA: Mild atherosclerotic changes at the origin of the SMA. Branches are patent. Renals: Main right renal artery patent with mild atherosclerosis at the origin. Fusiform dilation/ectasia of the distal main renal artery which involves the division. Greatest diameter measured is 7 mm. Mild atherosclerosis at the origin of the left renal artery with no high-grade stenosis. IMA: IMA remains patent. RIGHT Lower Extremity Mild atherosclerotic changes of the right common iliac artery without high-grade stenosis or occlusion. Hypogastric artery is patent as well as the anterior and posterior division. External iliac artery patent with no significant atherosclerotic changes. High bifurcation of the common femoral artery near the inguinal ligament. Profunda femoris patent as well as patent thigh branches. Minimal atherosclerosis of the superficial femoral artery. Popliteal arteries patent. Trifurcation is patent with no significant atherosclerosis of the tibial arteries. Attenuation of the contrast bolus at the ankle secondary to late arrival. LEFT Lower Extremity Mild atherosclerotic changes of the left common iliac artery. No dissection or aneurysm. No high-grade stenosis or occlusion. Hypogastric arteries patent. External  iliac artery patent with minimal atherosclerotic changes. Common femoral artery patent with minimal atherosclerosis. Profunda femoris patent as well as thigh branches. Trace atherosclerotic changes of the left superficial femoral artery. Popliteal artery is patent. Trifurcation is patent with minimal atherosclerosis of the tibial arteries. Decreased attenuation at the ankle likely secondary to late arrival of the contrast. Veins: There is asymmetric expansion of the left common iliac vein, external iliac vein, pelvic veins compared to the right. Surrounding inflammatory changes. Visualized left iliac veins on the delayed phase/venous imaging confirms thrombus extending through the confluence of the iliac veins. There is asymmetric expansion of the left common femoral vein and femoral vein with surrounding edema. Asymmetric appearance of the popliteal vein on the left compared to the right with hyperdense appearance of the veins as they extend into the left tibial veins. The left lower extremity was not imaged on the delayed phase venous imaging. Review of the MIP images confirms the above findings. NON-VASCULAR Lower chest: Incomplete imaging of bilateral breast reconstruction/augmentation. No acute finding of the lower chest with atelectasis/scarring. Nodules at the left lung base measured 2 mm-3 mm, new from the comparison CT of August 27, 2014 in a region of scarring. Hepatobiliary: Unremarkable.  Unremarkable gallbladder. Pancreas: Unremarkable Spleen: Unremarkable Adrenals/Urinary Tract: Unremarkable appearance of the bilateral adrenal glands. Right kidney unremarkable with no hydronephrosis or nephrolithiasis. Left kidney has been anteriorly displaced secondary to retroperitoneal hemorrhage. No evidence of active hemorrhage of the left kidney. There is a low-density fluid cyst at the inferior  collecting system which measures 5.9 cm compatible with Bosniak 1 cyst. No hydronephrosis or nephrolithiasis.  Stomach/Bowel: Unremarkable stomach with small hiatal hernia. Unremarkable small bowel. No abnormal distension no transition point. Normal appendix. Colonic diverticula without associated inflammatory changes. Lymphatic: No lymphadenopathy. Reproductive: Hysterectomy. Other: Left-sided retroperitoneal hemorrhage which is inseparable from the psoas muscle. No evidence of extravasated contrast to indicate bleeding/hemorrhage. The largest component is near the L3 level estimated 8.3 cm in diameter. The source is not identified on the current CT. Fat containing umbilical hernia. Musculoskeletal: No acute displaced fracture. Degenerative changes of the spine. IMPRESSION: Left-sided retroperitoneal hemorrhage with no evidence of active extravasation. The greatest diameter is estimated 8.3 cm, with the majority of hemorrhage inseparable from the psoas muscle, most likely a spontaneous retroperitoneal hemorrhage. CT demonstrates left-sided DVT, involving left common iliac vein, internal iliac veins, external iliac vein, common femoral vein, femoral vein, popliteal vein, and possibly tibial veins. Correlation with duplex might be useful. These above results were discussed by telephone at the time of interpretation on 01/13/2019 at 2:45 pm with Dr. Jeannie Done PFEIFFER Mild aortic and peripheral arterial disease, as above. Aortic Atherosclerosis (ICD10-I70.0). Chronic changes of the lung bases. There is a cluster of small nodules at the left lung base which is new from the comparison CT of 2016. While these most likely are inflammatory/infectious, follow-up is indicated. Non-contrast chest CT is recommended in 12 months.This recommendation follows the consensus statement: Guidelines for Management of Incidental Pulmonary Nodules Detected on CT Images: From the Fleischner Society 2017; Radiology 2017; 284:228-243. Additional ancillary findings as above. Signed, Dulcy Fanny. Dellia Nims, RPVI [facility] Electronically Signed   By: Corrie Mckusick D.O.   On: 01/13/2019 14:47   US Venous Img Lower Unilateral Left  Result Date: 01/10/2019 CLINICAL DATA:  Left lower extremity edema. EXAM: LEFT LOWER EXTREMITY VENOUS DOPPLER ULTRASOUND TECHNIQUE: Gray-scale sonography with graded compression, as well as color Doppler and duplex ultrasound were performed to evaluate the lower extremity deep venous systems from the level of the common femoral vein and including the common femoral, femoral, profunda femoral, popliteal and calf veins including the posterior tibial, peroneal and gastrocnemius veins when visible. The superficial great saphenous vein was also interrogated. Spectral Doppler was utilized to evaluate flow at rest and with distal augmentation maneuvers in the common femoral, femoral and popliteal veins. COMPARISON:  None. FINDINGS: Contralateral Common Femoral Vein: Respiratory phasicity is normal and symmetric with the symptomatic side. No evidence of thrombus. Normal compressibility. Common Femoral Vein: No evidence of thrombus. Normal compressibility, respiratory phasicity and response to augmentation. Saphenofemoral Junction: No evidence of thrombus. Normal compressibility and flow on color Doppler imaging. Profunda Femoral Vein: No evidence of thrombus. Normal compressibility and flow on color Doppler imaging. Femoral Vein: No evidence of thrombus. Normal compressibility, respiratory phasicity and response to augmentation. Popliteal Vein: No evidence of thrombus. Normal compressibility, respiratory phasicity and response to augmentation. Calf Veins: No evidence of thrombus. Normal compressibility and flow on color Doppler imaging. Superficial Great Saphenous Vein: No evidence of thrombus. Normal compressibility. Venous Reflux:  None. Other Findings: No evidence of superficial thrombophlebitis or abnormal fluid collection. IMPRESSION: No evidence of left lower extremity deep  venous thrombosis. Electronically Signed   By: Aletta Edouard M.D.   On: 01/10/2019 15:58   Vas [place] Ivc/iliac (venous Only)  Result Date: 01/13/2019 IVC/ILIAC STUDY Indications: DVT left common iliac vein Risk Factors: None. Other Factors: Acute DVT Left CFV, FV, PopV, GastV, PTV,  PeroV SSV.  Comparison Study: No prior studies. Performing Technologist: Oliver Hum RVT  Examination Guidelines: A complete evaluation includes B-mode imaging, spectral Doppler, color Doppler, and power Doppler as needed of all accessible portions of each vessel. Bilateral testing is considered an integral part of a complete examination. Limited examinations for reoccurring indications may be performed as noted.  IVC/Iliac Findings: +----------+------+--------+--------+     IVC     Patent Thrombus Comments  +----------+------+--------+--------+  IVC Prox   patent                    +----------+------+--------+--------+  IVC Mid    patent                    +----------+------+--------+--------+  IVC Distal patent                    +----------+------+--------+--------+  +-----------------+---------+-----------+---------+-----------+--------+         CIV        RT-Patent RT-Thrombus LT-Patent LT-Thrombus Comments  +-----------------+---------+-----------+---------+-----------+--------+  Common Iliac Prox  patent                            acute              +-----------------+---------+-----------+---------+-----------+--------+ +------------------------+---------+-----------+---------+-----------+--------+            EIV            RT-Patent RT-Thrombus LT-Patent LT-Thrombus Comments  +------------------------+---------+-----------+---------+-----------+--------+  External Iliac Vein Prox  patent                            acute              +------------------------+---------+-----------+---------+-----------+--------+  Summary: IVC/Iliac: There is no evidence of thrombus involving the IVC. There is no evidence of thrombus  involving the right common iliac vein. There is evidence of acute thrombus involving the left common iliac vein. There is no evidence of thrombus involving the right external iliac vein. There is evidence of acute thrombus involving the left external iliac vein.  *See table(s) above for measurements and observations.  Electronically signed by Servando Snare MD on 01/13/2019 at 9:55:20 PM.   Final    Vas [place] Lower Extremity Venous (dvt) (only [place])  Result Date: 01/13/2019  Lower Venous Study Indications: Swelling.  Risk Factors: None identified. Comparison Study: 01/10/2019 - Negative for DVT. Performing Technologist: Oliver Hum RVT  Examination Guidelines: A complete evaluation includes B-mode imaging, spectral Doppler, color Doppler, and power Doppler as needed of all accessible portions of each vessel. Bilateral testing is considered an integral part of a complete examination. Limited examinations for reoccurring indications may be performed as noted.  +-----+---------------+---------+-----------+----------+-------+  RIGHT Compressibility Phasicity Spontaneity Properties Summary  +-----+---------------+---------+-----------+----------+-------+  CFV   Full            Yes       Yes                             +-----+---------------+---------+-----------+----------+-------+   +---------+---------------+---------+-----------+----------+-------+  LEFT      Compressibility Phasicity Spontaneity Properties Summary  +---------+---------------+---------+-----------+----------+-------+  CFV       None            No        No  Acute    +---------+---------------+---------+-----------+----------+-------+  SFJ       None                                             Acute    +---------+---------------+---------+-----------+----------+-------+  FV Prox   None            No        No                     Acute    +---------+---------------+---------+-----------+----------+-------+  FV Mid    None             No        No                     Acute    +---------+---------------+---------+-----------+----------+-------+  FV Distal None            No        No                     Acute    +---------+---------------+---------+-----------+----------+-------+  PFV       None                                             Acute    +---------+---------------+---------+-----------+----------+-------+  POP       None            No        No                     Acute    +---------+---------------+---------+-----------+----------+-------+  PTV       None                                             Acute    +---------+---------------+---------+-----------+----------+-------+  PERO      None                                             Acute    +---------+---------------+---------+-----------+----------+-------+  Gastroc   None                                             Acute    +---------+---------------+---------+-----------+----------+-------+  SSV       None                                             Acute    +---------+---------------+---------+-----------+----------+-------+     Summary: Right: No evidence of common femoral vein obstruction. Left: Findings consistent with acute deep vein thrombosis involving the left common femoral vein, left femoral vein, left proximal profunda vein, left popliteal vein, left posterior tibial veins, left peroneal veins, and  left gastrocnemius veins. Findings consistent with acute superficial vein thrombosis involving the left small saphenous vein. No cystic structure found in the popliteal fossa.  *See table(s) above for measurements and observations. Electronically signed by Servando Snare MD on 01/13/2019 at 9:55:39 PM.    Final    Hybrid Or Imaging (mc Only)  Result Date: 01/13/2019 There is no interpretation for this exam.  This order is for images obtained during a surgical procedure.  Please See "Surgeries" Tab for more information regarding the procedure.    DISCHARGE  EXAMINATION: Vitals:   01/15/19 1604 01/15/19 2057 01/16/19 0447 01/16/19 0914  BP: 137/76 108/70 124/79   Pulse: 90 (!) 115 (!) 59   Resp: 18 16 18    Temp: 97.8 F (36.6 C) 99.5 F (37.5 C) 99.5 F (37.5 C)   TempSrc: Oral Oral Oral   SpO2: 91% 95% 90% (!) 89%  Weight:      Height:       General appearance: Awake alert.  In no distress Resp: Clear to auscultation bilaterally.  Normal effort Cardio: S1-S2 noted to be tachycardic regular.  No S3-S4.  Telemetry shows sinus tachycardia. GI: Abdomen is soft.  Nontender nondistended.  Bowel sounds are present normal.  No masses organomegaly Extremities: Swollen left lower extremity Neurologic: Alert and oriented x3.  No focal neurological deficits.    DISPOSITION: Home  Discharge Instructions    Call MD for:  difficulty breathing, headache or visual disturbances   Complete by: As directed    Call MD for:  extreme fatigue   Complete by: As directed    Call MD for:  persistant dizziness or light-headedness   Complete by: As directed    Call MD for:  persistant nausea and vomiting   Complete by: As directed    Call MD for:  severe uncontrolled pain   Complete by: As directed    Call MD for:  temperature >100.4   Complete by: As directed    Diet general   Complete by: As directed    Discharge instructions   Complete by: As directed    Please be sure to follow-up with the vascular surgeon in 2 weeks.  Take your medications as prescribed.  Keep wearing the compression stockings every day.  Keep your leg elevated as much as possible.  You were cared for by a hospitalist during your hospital stay. If you have any questions about your discharge medications or the care you received while you were in the hospital after you are discharged, you can call the unit and asked to speak with the hospitalist on call if the hospitalist that took care of you is not available. Once you are discharged, your primary care physician will handle any  further medical issues. Please note that NO REFILLS for any discharge medications will be authorized once you are discharged, as it is imperative that you return to your primary care physician (or establish a relationship with a primary care physician if you do not have one) for your aftercare needs so that they can reassess your need for medications and monitor your lab values. If you do not have a primary care physician, you can call [phone] for a physician referral.   Increase activity slowly   Complete by: As directed         Allergies as of 01/16/2019      Reactions   Clarithromycin Swelling   Penicillins Swelling   Tongue swelling Did it involve swelling of the face/tongue/throat, SOB,  or low BP? Yes Did it involve sudden or severe rash/hives, skin peeling, or any reaction on the inside of your mouth or nose? Yes Did you need to seek medical attention at a hospital or doctor's office? Yes When did it last happen?childhood If all above answers are "NO", may proceed with cephalosporin use.   Pneumococcal Vaccines Other (See Comments)   Couldn't breathe   Sulfonamide Derivatives Swelling   Estradiol Rash   Cream gives her a rash      Medication List    STOP taking these medications   Spiriva Respimat 2.5 MCG/ACT Aers Generic drug: Tiotropium Bromide Monohydrate     TAKE these medications   albuterol (2.5 MG/3ML) 0.083% nebulizer solution Commonly known as: PROVENTIL Take 3 mLs (2.5 mg total) by nebulization every 6 (six) hours.   albuterol 108 (90 Base) MCG/ACT inhaler Commonly known as: VENTOLIN HFA Inhale 2 puffs into the lungs every 6 (six) hours as needed for wheezing or shortness of breath.   aspirin 81 MG EC tablet Take 1 tablet (81 mg total) by mouth daily. Start taking on: January 17, 2019   Bevespi Aerosphere 9-4.8 MCG/ACT Aero Generic drug: Glycopyrrolate-Formoterol Inhale 2 puffs into the lungs 2 (two) times a day.   cephALEXin 250 MG capsule Commonly  known as: KEFLEX Take 250 mg by mouth daily.   cetirizine 10 MG tablet Commonly known as: ZYRTEC Take 10 mg by mouth daily.   Fluticasone-Salmeterol 250-50 MCG/DOSE Aepb Commonly known as: ADVAIR INHALE 1 DOSE BY MOUTH TWICE DAILY, RINSE MOUTH AFTER USE What changed:   how much to take  how to take this  when to take this  additional instructions   HYDROcodone-acetaminophen 7.5-325 MG tablet Commonly known as: NORCO Take 1 tablet by mouth every 6 (six) hours as needed for moderate pain.   LORazepam 1 MG tablet Commonly known as: ATIVAN Take 0.5-1 mg by mouth at bedtime.   omeprazole 20 MG capsule Commonly known as: PRILOSEC Take 1 tablet by mouth 2 (two) times daily before a meal.   traZODone 100 MG tablet Commonly known as: DESYREL Take 100 mg by mouth at bedtime.        Follow-up Information    Deland Pretty, MD. Schedule an appointment as soon as possible for a visit in 1 week(s).   Specialty: Internal Medicine Contact information: [address] [place] [address] [phone]        Waynetta Sandy, MD. Schedule an appointment as soon as possible for a visit in 2 week(s).   Specialties: Vascular Surgery, Cardiology Contact information: [address] [place] [address] [phone]           TOTAL DISCHARGE TIME: 43 minutes  [place]  [facility] Pager on [email or web address]  01/16/2019, 1:16 PM

## 2019-01-16 NOTE — Progress Notes (Signed)
  Progress Note    01/16/2019 2:08 PM 3 Days Post-Op  Subjective:  Still having left leg pain  Vitals:   01/16/19 0447 01/16/19 0914  BP: 124/79   Pulse: (!) 59   Resp: 18   Temp: 99.5 F (37.5 C)   SpO2: 90% (!) 89%    Physical Exam: aaox3 Abdomen soft with stable left flank pain Left leg edematous but soft  CBC    Component Value Date/Time   WBC 12.4 (H) 01/16/2019 0321   RBC 3.37 (L) 01/16/2019 0321   HGB 10.1 (L) 01/16/2019 0321   HCT 31.3 (L) 01/16/2019 0321   PLT 277 01/16/2019 0321   MCV 92.9 01/16/2019 0321   MCH 30.0 01/16/2019 0321   MCHC 32.3 01/16/2019 0321   RDW 13.7 01/16/2019 0321   LYMPHSABS 1.5 01/14/2019 0952   MONOABS 1.2 (H) 01/14/2019 0952   EOSABS 0.1 01/14/2019 0952   BASOSABS 0.1 01/14/2019 0952    BMET    Component Value Date/Time   NA 135 01/16/2019 0321   K 3.8 01/16/2019 0321   CL 99 01/16/2019 0321   CO2 26 01/16/2019 0321   GLUCOSE 125 (H) 01/16/2019 0321   BUN 15 01/16/2019 0321   CREATININE 0.72 01/16/2019 0321   CALCIUM 8.5 (L) 01/16/2019 0321   GFRNONAA >60 01/16/2019 0321   GFRAA >60 01/16/2019 0321    INR    Component Value Date/Time   INR 1.1 01/13/2019 1458     Intake/Output Summary (Last 24 hours) at 01/16/2019 1408 Last data filed at 01/16/2019 0920 Gross per 24 hour  Intake 446 ml  Output -  Net 446 ml     Assessment/plan:  78 y.o. female is s/p IVC filter placement and patient with left-sided retroperitoneal hematoma likely spontaneous in origin with extensive left lower extremity DVT.  Aspirin started.  CBC has been stable.  We will have her follow-up in 2 weeks for evaluation likely start anticoagulation then and can evaluate her for left lower extremity intervention from a DVT standpoint along with filter removal.   Detavious Rinn C. Donzetta Matters, MD [facility] Office: [phone] Pager: [phone]  01/16/2019 2:08 PM

## 2019-01-16 NOTE — Discharge Instructions (Signed)

## 2019-01-22 DIAGNOSIS — R58 Hemorrhage, not elsewhere classified: Secondary | ICD-10-CM | POA: Diagnosis not present

## 2019-01-22 DIAGNOSIS — D72829 Elevated white blood cell count, unspecified: Secondary | ICD-10-CM | POA: Diagnosis not present

## 2019-01-22 DIAGNOSIS — I82422 Acute embolism and thrombosis of left iliac vein: Secondary | ICD-10-CM | POA: Diagnosis not present

## 2019-01-22 DIAGNOSIS — R0602 Shortness of breath: Secondary | ICD-10-CM | POA: Diagnosis not present

## 2019-01-22 DIAGNOSIS — R911 Solitary pulmonary nodule: Secondary | ICD-10-CM | POA: Diagnosis not present

## 2019-01-22 DIAGNOSIS — R188 Other ascites: Secondary | ICD-10-CM | POA: Diagnosis not present

## 2019-01-28 ENCOUNTER — Encounter (HOSPITAL_COMMUNITY): Payer: Self-pay

## 2019-01-28 ENCOUNTER — Emergency Department (HOSPITAL_COMMUNITY): Payer: PPO

## 2019-01-28 ENCOUNTER — Inpatient Hospital Stay (HOSPITAL_COMMUNITY)
Admission: EM | Admit: 2019-01-28 | Discharge: 2019-02-02 | DRG: 175 | Disposition: A | Discharge status: Home / Self Care | Payer: PPO | Attending: Family Medicine | Admitting: Family Medicine

## 2019-01-28 ENCOUNTER — Inpatient Hospital Stay (HOSPITAL_COMMUNITY): Payer: PPO

## 2019-01-28 ENCOUNTER — Other Ambulatory Visit: Payer: Self-pay

## 2019-01-28 DIAGNOSIS — K219 Gastro-esophageal reflux disease without esophagitis: Secondary | ICD-10-CM | POA: Diagnosis present

## 2019-01-28 DIAGNOSIS — Z881 Allergy status to other antibiotic agents status: Secondary | ICD-10-CM

## 2019-01-28 DIAGNOSIS — Z8249 Family history of ischemic heart disease and other diseases of the circulatory system: Secondary | ICD-10-CM

## 2019-01-28 DIAGNOSIS — Z7951 Long term (current) use of inhaled steroids: Secondary | ICD-10-CM

## 2019-01-28 DIAGNOSIS — Z8719 Personal history of other diseases of the digestive system: Secondary | ICD-10-CM | POA: Diagnosis not present

## 2019-01-28 DIAGNOSIS — Z888 Allergy status to other drugs, medicaments and biological substances status: Secondary | ICD-10-CM | POA: Diagnosis not present

## 2019-01-28 DIAGNOSIS — J9601 Acute respiratory failure with hypoxia: Secondary | ICD-10-CM | POA: Diagnosis present

## 2019-01-28 DIAGNOSIS — R609 Edema, unspecified: Secondary | ICD-10-CM | POA: Diagnosis not present

## 2019-01-28 DIAGNOSIS — R58 Hemorrhage, not elsewhere classified: Secondary | ICD-10-CM | POA: Diagnosis present

## 2019-01-28 DIAGNOSIS — I82502 Chronic embolism and thrombosis of unspecified deep veins of left lower extremity: Secondary | ICD-10-CM | POA: Diagnosis not present

## 2019-01-28 DIAGNOSIS — I2699 Other pulmonary embolism without acute cor pulmonale: Secondary | ICD-10-CM | POA: Diagnosis not present

## 2019-01-28 DIAGNOSIS — Z880 Allergy status to penicillin: Secondary | ICD-10-CM | POA: Diagnosis not present

## 2019-01-28 DIAGNOSIS — I361 Nonrheumatic tricuspid (valve) insufficiency: Secondary | ICD-10-CM | POA: Diagnosis not present

## 2019-01-28 DIAGNOSIS — Z7982 Long term (current) use of aspirin: Secondary | ICD-10-CM | POA: Diagnosis not present

## 2019-01-28 DIAGNOSIS — K661 Hemoperitoneum: Secondary | ICD-10-CM

## 2019-01-28 DIAGNOSIS — I2782 Chronic pulmonary embolism: Secondary | ICD-10-CM | POA: Diagnosis present

## 2019-01-28 DIAGNOSIS — Z87891 Personal history of nicotine dependence: Secondary | ICD-10-CM | POA: Diagnosis not present

## 2019-01-28 DIAGNOSIS — Z95828 Presence of other vascular implants and grafts: Secondary | ICD-10-CM

## 2019-01-28 DIAGNOSIS — Z833 Family history of diabetes mellitus: Secondary | ICD-10-CM

## 2019-01-28 DIAGNOSIS — J9611 Chronic respiratory failure with hypoxia: Secondary | ICD-10-CM | POA: Diagnosis present

## 2019-01-28 DIAGNOSIS — Z825 Family history of asthma and other chronic lower respiratory diseases: Secondary | ICD-10-CM | POA: Diagnosis not present

## 2019-01-28 DIAGNOSIS — J441 Chronic obstructive pulmonary disease with (acute) exacerbation: Secondary | ICD-10-CM | POA: Diagnosis present

## 2019-01-28 DIAGNOSIS — Z9981 Dependence on supplemental oxygen: Secondary | ICD-10-CM | POA: Diagnosis not present

## 2019-01-28 DIAGNOSIS — I82409 Acute embolism and thrombosis of unspecified deep veins of unspecified lower extremity: Secondary | ICD-10-CM | POA: Diagnosis present

## 2019-01-28 DIAGNOSIS — Z20828 Contact with and (suspected) exposure to other viral communicable diseases: Secondary | ICD-10-CM | POA: Diagnosis not present

## 2019-01-28 DIAGNOSIS — I2609 Other pulmonary embolism with acute cor pulmonale: Principal | ICD-10-CM | POA: Diagnosis present

## 2019-01-28 DIAGNOSIS — R0602 Shortness of breath: Secondary | ICD-10-CM | POA: Diagnosis not present

## 2019-01-28 DIAGNOSIS — Z887 Allergy status to serum and vaccine status: Secondary | ICD-10-CM | POA: Diagnosis not present

## 2019-01-28 DIAGNOSIS — Z882 Allergy status to sulfonamides status: Secondary | ICD-10-CM

## 2019-01-28 DIAGNOSIS — R0902 Hypoxemia: Secondary | ICD-10-CM | POA: Diagnosis not present

## 2019-01-28 DIAGNOSIS — E876 Hypokalemia: Secondary | ICD-10-CM | POA: Diagnosis present

## 2019-01-28 DIAGNOSIS — Z79899 Other long term (current) drug therapy: Secondary | ICD-10-CM

## 2019-01-28 DIAGNOSIS — I4581 Long QT syndrome: Secondary | ICD-10-CM | POA: Diagnosis not present

## 2019-01-28 DIAGNOSIS — R069 Unspecified abnormalities of breathing: Secondary | ICD-10-CM | POA: Diagnosis not present

## 2019-01-28 DIAGNOSIS — I82402 Acute embolism and thrombosis of unspecified deep veins of left lower extremity: Secondary | ICD-10-CM | POA: Diagnosis not present

## 2019-01-28 DIAGNOSIS — IMO0001 Reserved for inherently not codable concepts without codable children: Secondary | ICD-10-CM

## 2019-01-28 LAB — COMPREHENSIVE METABOLIC PANEL
ALT: 19 U/L (ref 0–44)
AST: 29 U/L (ref 15–41)
Albumin: 3.5 g/dL (ref 3.5–5.0)
Alkaline Phosphatase: 88 U/L (ref 38–126)
Anion gap: 13 (ref 5–15)
BUN: 11 mg/dL (ref 8–23)
CO2: 24 mmol/L (ref 22–32)
Calcium: 8.7 mg/dL — ABNORMAL LOW (ref 8.9–10.3)
Chloride: 104 mmol/L (ref 98–111)
Creatinine, Ser: 0.87 mg/dL (ref 0.44–1.00)
GFR calc Af Amer: >60 mL/min (ref >60)
GFR calc non Af Amer: >60 mL/min (ref >60)
Glucose, Bld: 110 mg/dL — ABNORMAL HIGH (ref 70–99)
Potassium: 3.4 mmol/L — ABNORMAL LOW (ref 3.5–5.1)
Sodium: 141 mmol/L (ref 135–145)
Total Bilirubin: 1.5 mg/dL — ABNORMAL HIGH (ref 0.3–1.2)
Total Protein: 6.7 g/dL (ref 6.5–8.1)

## 2019-01-28 LAB — BRAIN NATRIURETIC PEPTIDE: B Natriuretic Peptide: 256.7 pg/mL — ABNORMAL HIGH (ref 0.0–100.0)

## 2019-01-28 LAB — CBC WITH DIFFERENTIAL/PLATELET
Abs Immature Granulocytes: 0.1 10*3/uL — ABNORMAL HIGH (ref 0.00–0.07)
Basophils Absolute: 0 10*3/uL (ref 0.0–0.1)
Basophils Relative: 0 %
Eosinophils Absolute: 0.1 10*3/uL (ref 0.0–0.5)
Eosinophils Relative: 1 %
HCT: 41 % (ref 36.0–46.0)
Hemoglobin: 12.4 g/dL (ref 12.0–15.0)
Immature Granulocytes: 1 %
Lymphocytes Relative: 7 %
Lymphs Abs: 0.8 10*3/uL (ref 0.7–4.0)
MCH: 29.7 pg (ref 26.0–34.0)
MCHC: 30.2 g/dL (ref 30.0–36.0)
MCV: 98.3 fL (ref 80.0–100.0)
Monocytes Absolute: 0.9 10*3/uL (ref 0.1–1.0)
Monocytes Relative: 7 %
Neutro Abs: 9.8 10*3/uL — ABNORMAL HIGH (ref 1.7–7.7)
Neutrophils Relative %: 84 %
Platelets: 482 10*3/uL — ABNORMAL HIGH (ref 150–400)
RBC: 4.17 MIL/uL (ref 3.87–5.11)
RDW: 15.7 % — ABNORMAL HIGH (ref 11.5–15.5)
WBC: 11.7 10*3/uL — ABNORMAL HIGH (ref 4.0–10.5)
nRBC: 0 % (ref 0.0–0.2)

## 2019-01-28 LAB — SARS CORONAVIRUS 2 (TAT 6-24 HRS): SARS Coronavirus 2: NEGATIVE

## 2019-01-28 LAB — ECHOCARDIOGRAM COMPLETE
Height: 66 in
Weight: 2786.6 oz

## 2019-01-28 LAB — HEMOGLOBIN AND HEMATOCRIT, BLOOD
HCT: 36.5 % (ref 36.0–46.0)
HCT: 37.3 % (ref 36.0–46.0)
Hemoglobin: 11.2 g/dL — ABNORMAL LOW (ref 12.0–15.0)
Hemoglobin: 11.4 g/dL — ABNORMAL LOW (ref 12.0–15.0)

## 2019-01-28 LAB — TROPONIN I (HIGH SENSITIVITY)
Troponin I (High Sensitivity): 9 ng/L (ref <18)
Troponin I (High Sensitivity): 8 ng/L (ref <18)

## 2019-01-28 LAB — HEPARIN LEVEL (UNFRACTIONATED): Heparin Unfractionated: 0.26 IU/mL — ABNORMAL LOW (ref 0.30–0.70)

## 2019-01-28 MED ORDER — OXYCODONE HCL 5 MG PO TABS
10.0000 mg | ORAL_TABLET | ORAL | Status: DC | PRN
  Administered 2019-01-28 – 2019-02-02 (×12): 10 mg via ORAL
  Filled 2019-01-28 (×12): qty 2

## 2019-01-28 MED ORDER — PANTOPRAZOLE SODIUM 40 MG PO TBEC
40.0000 mg | DELAYED_RELEASE_TABLET | Freq: Every day | ORAL | Status: DC
  Administered 2019-01-28 – 2019-02-02 (×6): 40 mg via ORAL
  Filled 2019-01-28 (×6): qty 1

## 2019-01-28 MED ORDER — IOHEXOL 350 MG/ML SOLN
100.0000 mL | Freq: Once | INTRAVENOUS | Status: AC | PRN
  Administered 2019-01-28: 12:00:00 100 mL via INTRAVENOUS

## 2019-01-28 MED ORDER — CYCLOBENZAPRINE HCL 10 MG PO TABS
10.0000 mg | ORAL_TABLET | Freq: Three times a day (TID) | ORAL | Status: DC
  Filled 2019-01-28 (×3): qty 1

## 2019-01-28 MED ORDER — ALBUTEROL SULFATE (2.5 MG/3ML) 0.083% IN NEBU
2.5000 mg | INHALATION_SOLUTION | Freq: Four times a day (QID) | RESPIRATORY_TRACT | Status: DC
  Administered 2019-01-28 (×2): 2.5 mg via RESPIRATORY_TRACT
  Filled 2019-01-28 (×2): qty 3

## 2019-01-28 MED ORDER — MOMETASONE FURO-FORMOTEROL FUM 200-5 MCG/ACT IN AERO
2.0000 | INHALATION_SPRAY | Freq: Two times a day (BID) | RESPIRATORY_TRACT | Status: DC
  Administered 2019-01-29 – 2019-02-02 (×9): 2 via RESPIRATORY_TRACT
  Filled 2019-01-28: qty 8.8

## 2019-01-28 MED ORDER — SODIUM CHLORIDE 0.9% FLUSH
3.0000 mL | Freq: Two times a day (BID) | INTRAVENOUS | Status: DC
  Administered 2019-01-29 – 2019-02-02 (×6): 3 mL via INTRAVENOUS

## 2019-01-28 MED ORDER — TRAZODONE HCL 100 MG PO TABS
100.0000 mg | ORAL_TABLET | Freq: Every day | ORAL | Status: DC
  Administered 2019-01-28 – 2019-02-01 (×5): 100 mg via ORAL
  Filled 2019-01-28 (×5): qty 1

## 2019-01-28 MED ORDER — CEPHALEXIN 250 MG PO CAPS
500.0000 mg | ORAL_CAPSULE | Freq: Two times a day (BID) | ORAL | Status: AC
  Administered 2019-01-29 (×2): 500 mg via ORAL
  Filled 2019-01-28 (×3): qty 2

## 2019-01-28 MED ORDER — LORATADINE 10 MG PO TABS
10.0000 mg | ORAL_TABLET | Freq: Every day | ORAL | Status: DC
  Administered 2019-01-29 – 2019-02-02 (×5): 10 mg via ORAL
  Filled 2019-01-28 (×5): qty 1

## 2019-01-28 MED ORDER — LORAZEPAM 1 MG PO TABS
0.5000 mg | ORAL_TABLET | Freq: Every day | ORAL | Status: DC
  Administered 2019-01-28 – 2019-02-01 (×5): 1 mg via ORAL
  Filled 2019-01-28 (×5): qty 1

## 2019-01-28 MED ORDER — HEPARIN (PORCINE) 25000 UT/250ML-% IV SOLN
1350.0000 [IU]/h | INTRAVENOUS | Status: DC
  Administered 2019-01-28: 1200 [IU]/h via INTRAVENOUS
  Administered 2019-01-29 – 2019-01-31 (×3): 1350 [IU]/h via INTRAVENOUS
  Filled 2019-01-28 (×4): qty 250

## 2019-01-28 MED ORDER — CEPHALEXIN 250 MG PO CAPS
250.0000 mg | ORAL_CAPSULE | Freq: Every day | ORAL | Status: DC
  Administered 2019-01-30 – 2019-02-02 (×4): 250 mg via ORAL
  Filled 2019-01-28 (×4): qty 1

## 2019-01-28 MED ORDER — POTASSIUM CHLORIDE CRYS ER 20 MEQ PO TBCR
20.0000 meq | EXTENDED_RELEASE_TABLET | Freq: Two times a day (BID) | ORAL | Status: AC
  Administered 2019-01-28 – 2019-01-30 (×4): 20 meq via ORAL
  Filled 2019-01-28 (×4): qty 1

## 2019-01-28 MED ORDER — ALBUTEROL SULFATE HFA 108 (90 BASE) MCG/ACT IN AERS: 2.0000 | INHALATION_SPRAY | Freq: Four times a day (QID) | RESPIRATORY_TRACT | Status: DC | PRN

## 2019-01-28 MED ORDER — HYDROCODONE-ACETAMINOPHEN 5-325 MG PO TABS
1.5000 | ORAL_TABLET | Freq: Once | ORAL | Status: AC
  Administered 2019-01-28: 11:00:00 1.5 via ORAL
  Filled 2019-01-28: qty 2

## 2019-01-28 NOTE — ED Triage Notes (Signed)
Patient complains of ongoing left leg pain and swelling since reported blood clot removal from same 2 weeks ago. This am had SOB with same and per EMS sats were 91-92 on fire arrival. Patient alert and oriented, NAD

## 2019-01-28 NOTE — Progress Notes (Signed)
  Echocardiogram 2D Echocardiogram has been performed.  Jennette Dubin 01/28/2019, 3:13 PM

## 2019-01-28 NOTE — Progress Notes (Signed)
[place] for heparin Indication: pulmonary embolus  Allergies  Allergen Reactions  . Clarithromycin Swelling  . Penicillins Swelling    Tongue swelling Did it involve swelling of the face/tongue/throat, SOB, or low BP? Yes Did it involve sudden or severe rash/hives, skin peeling, or any reaction on the inside of your mouth or nose? Yes Did you need to seek medical attention at a hospital or doctor's office? Yes When did it last happen?childhood If all above answers are "NO", may proceed with cephalosporin use.   . Pneumococcal Vaccines Other (See Comments)    Couldn't breathe  . Sulfonamide Derivatives Swelling  . Estradiol Rash    Cream gives her a rash    Patient Measurements: Height: 5\' 6"  (167.6 cm) Weight: 174 lb 2.6 oz (79 kg) IBW/kg (Calculated) : 59.3 Heparin Dosing Weight: 75.6kg  Vital Signs: Temp: 97.6 F (36.4 C) (09/01 2105) Temp Source: Oral (09/01 2105) BP: 131/82 (09/01 2105) Pulse Rate: 95 (09/01 2105)  Labs: Recent Labs    01/28/19 1012 01/28/19 1641 01/28/19 2159  HGB 12.4 11.2* 11.4*  HCT 41.0 36.5 37.3  PLT 482*  --   --   HEPARINUNFRC  --   --  0.26*  CREATININE 0.87  --   --   TROPONINIHS 9 8  --     Estimated Creatinine Clearance: 59.3 mL/min (by C-G formula based on SCr of 0.87 mg/dL).   Assessment: 78 y.o. female with PE for heparin   Goal of Therapy:  Heparin level 0.3-0.7 units/ml Monitor platelets by anticoagulation protocol: Yes   Plan:  Increase Heparin 1350 units/hr Follow-up am labs.   Caryl Pina 01/28/2019,11:08 PM

## 2019-01-28 NOTE — ED Provider Notes (Signed)
[facility] EMERGENCY DEPARTMENT Provider Note   CSN: [id number] Arrival date & time: 01/28/19  0909     History   Chief Complaint Chief Complaint  Patient presents with   hx of DVT/ SOB    HPI [name] is a 78 y.o. female.     HPI   78 year old female with a history of COPD, recent admission with discharge on August 20 for a left-sided retroperitoneal hematoma and acute DVT of the left lower extremity with IVC filter placement, who presents with concern for 3 to 4 days of shortness of breath, continuing pain and swelling of the left lower extremity.  Reports that she has been taking medications for pain in the left lower extremity, but continues to have pain and feels like it is very heavy, making it difficult for her to ambulate.  She has been wearing her compression hose.  She is not yet seen vascular surgery for follow-up.  Over the last 3 or 4 days she notes that she has had shortness of breath particularly with exertion.  Reports that she will not walk very far before she feels dyspnea.  Denies orthopnea, cough, fever.  Reports that she had one brief episode of chest pain early on after the procedure but has not had any exertional chest pain associated with her symptoms.  She does not feel that she has been wheezing as she has in the past with her asthma or COPD, but does report some improvement with inhalers.  Past Medical History:  Diagnosis Date   Allergic rhinitis    Asthma    Chronic airway obstruction, not elsewhere classified    Depressive disorder, not elsewhere classified    Esophageal reflux     Patient Active Problem List   Diagnosis Date Noted   DVT, recurrent, lower extremity, chronic, left ([place]) 01/28/2019   Acute pulmonary embolism with acute cor pulmonale ([place]) 01/28/2019   Hypokalemia 01/28/2019   Retroperitoneal bleed 01/13/2019   CAP (community acquired pneumonia) 05/27/2013   SOB (shortness of breath) 05/27/2013    Myalgia 05/27/2013   COPD with acute exacerbation ([place]) 05/27/2013   Acute respiratory failure with hypoxia ([place]) 05/27/2013   Vertebral compression fracture ([place]) 09/15/2010   SCIATICA 12/16/2007   COPD mixed type ([place]) 07/17/2007   ALLERGIC RHINITIS 07/16/2007   BRONCHITIS 07/16/2007   ASTHMA 07/16/2007   Esophageal reflux 07/16/2007    Past Surgical History:  Procedure Laterality Date   ABDOMINAL HYSTERECTOMY     CYSTOSCOPY     NASAL SINUS SURGERY     VENA CAVA FILTER PLACEMENT N/A 01/13/2019   Procedure: INSERTION VENA-CAVA FILTER;  Surgeon: Waynetta Sandy, MD;  Location: [facility];  Service: Vascular;  Laterality: N/A;     OB History   No obstetric history on file.      Home Medications    Prior to Admission medications   Medication Sig Start Date End Date Taking? Authorizing Provider  albuterol (PROVENTIL) (2.5 MG/3ML) 0.083% nebulizer solution Take 3 mLs (2.5 mg total) by nebulization every 6 (six) hours. 09/24/14  Yes Young, Tarri Fuller D, MD  albuterol (VENTOLIN HFA) 108 (90 Base) MCG/ACT inhaler Inhale 2 puffs into the lungs every 6 (six) hours as needed for wheezing or shortness of breath. 12/26/18  Yes Baird Lyons D, MD  aspirin EC 81 MG EC tablet Take 1 tablet (81 mg total) by mouth daily. 01/17/19  Yes Bonnielee Haff, MD  cephALEXin (KEFLEX) 250 MG capsule Take 250 mg by mouth  daily. 12/04/18  Yes [provider]  cetirizine (ZYRTEC) 10 MG tablet Take 10 mg by mouth as needed for allergies.    Yes [provider]  cyclobenzaprine (FLEXERIL) 10 MG tablet Take 10 mg by mouth 3 (three) times daily. 01/10/19  Yes [provider]  Glycopyrrolate-Formoterol (BEVESPI AEROSPHERE) 9-4.8 MCG/ACT AERO Inhale 2 puffs into the lungs 2 (two) times a day. 12/26/18  Yes Young, Tarri Fuller D, MD  HYDROcodone-acetaminophen (NORCO) 7.5-325 MG tablet Take 1 tablet by mouth every 6 (six) hours as needed for moderate pain. 01/16/19  Yes Bonnielee Haff,  MD  LORazepam (ATIVAN) 1 MG tablet Take 0.5-1 mg by mouth at bedtime.   Yes [provider]  omeprazole (PRILOSEC) 20 MG capsule Take 1 tablet by mouth daily.  09/13/11  Yes [provider]  traZODone (DESYREL) 100 MG tablet Take 100 mg by mouth at bedtime.    Yes [provider]  cephALEXin (KEFLEX) 500 MG capsule Take 500 mg by mouth every 12 (twelve) hours. 01/22/19   [provider]  Fluticasone-Salmeterol (ADVAIR) 250-50 MCG/DOSE AEPB INHALE 1 DOSE BY MOUTH TWICE DAILY, RINSE MOUTH AFTER USE Patient taking differently: Inhale 1 puff into the lungs 2 (two) times daily.  09/09/18   Deneise Lever, MD    Family History Family History  Problem Relation Age of Onset   Diabetes Mother    Coronary artery disease Mother    Emphysema Father    Coronary artery disease Brother     Social History Social History   Tobacco Use   Smoking status: Former Smoker    Packs/day: 0.25    Years: 40.00    Pack years: 10.00    Types: Cigarettes    Quit date: 05/27/2013    Years since quitting: 5.6   Smokeless tobacco: Never Used  Substance Use Topics   Alcohol use: No   Drug use: No     Allergies   Clarithromycin, Penicillins, Pneumococcal vaccines, Sulfonamide derivatives, and Estradiol   Review of Systems Review of Systems  Constitutional: Negative for fever.  HENT: Negative for sore throat.   Eyes: Negative for visual disturbance.  Respiratory: Positive for shortness of breath. Negative for cough.   Cardiovascular: Negative for chest pain.  Gastrointestinal: Negative for abdominal pain, nausea and vomiting.  Genitourinary: Negative for difficulty urinating.  Musculoskeletal: Positive for myalgias. Negative for back pain and neck pain.  Skin: Negative for rash.  Neurological: Negative for syncope and headaches.     Physical Exam Updated Vital Signs BP 132/88 (BP Location: Left Arm)    Pulse 93    Temp 97.9 F (36.6 C) (Oral)    Resp  20    Ht 5\' 6"  (1.676 m)    Wt 79 kg    SpO2 98%    BMI 28.11 kg/m   Physical Exam Vitals signs and nursing note reviewed.  Constitutional:      General: She is not in acute distress.    Appearance: She is well-developed. She is not diaphoretic.  HENT:     Head: Normocephalic and atraumatic.  Eyes:     Conjunctiva/sclera: Conjunctivae normal.  Neck:     Musculoskeletal: Normal range of motion.  Cardiovascular:     Rate and Rhythm: Normal rate and regular rhythm.     Heart sounds: Normal heart sounds.  Pulmonary:     Effort: Pulmonary effort is normal. No respiratory distress.     Breath sounds: Normal breath sounds. No wheezing or rales.  Abdominal:     General: There is no distension.     Palpations: Abdomen is soft.     Tenderness: There is no abdominal tenderness. There is no guarding.  Musculoskeletal:        General: Swelling (LLE) present. No tenderness.  Skin:    General: Skin is warm and dry.     Findings: No erythema or rash.  Neurological:     Mental Status: She is alert and oriented to person, place, and time.      ED Treatments / Results  Labs (all labs ordered are listed, but only abnormal results are displayed) Labs Reviewed  CBC WITH DIFFERENTIAL/PLATELET - Abnormal; Notable for the following components:      Result Value   WBC 11.7 (*)    RDW 15.7 (*)    Platelets 482 (*)    Neutro Abs 9.8 (*)    Abs Immature Granulocytes 0.10 (*)    All other components within normal limits  COMPREHENSIVE METABOLIC PANEL - Abnormal; Notable for the following components:   Potassium 3.4 (*)    Glucose, Bld 110 (*)    Calcium 8.7 (*)    Total Bilirubin 1.5 (*)    All other components within normal limits  BRAIN NATRIURETIC PEPTIDE - Abnormal; Notable for the following components:   B Natriuretic Peptide 256.7 (*)    All other components within normal limits  HEMOGLOBIN AND HEMATOCRIT, BLOOD - Abnormal; Notable for the following components:   Hemoglobin 11.2 (*)     All other components within normal limits  SARS CORONAVIRUS 2 (TAT 6-24 HRS)  HEPARIN LEVEL (UNFRACTIONATED)  HEMOGLOBIN AND HEMATOCRIT, BLOOD  HEPARIN LEVEL (UNFRACTIONATED)  CBC  BASIC METABOLIC PANEL  HEMOGLOBIN AND HEMATOCRIT, BLOOD  HEMOGLOBIN AND HEMATOCRIT, BLOOD  TROPONIN I (HIGH SENSITIVITY)  TROPONIN I (HIGH SENSITIVITY)    EKG EKG Interpretation  Date/Time:  Tuesday January 28 2019 10:24:33 EDT Ventricular Rate:  99 PR Interval:    QRS Duration: 90 QT Interval:  427 QTC Calculation: 548 R Axis:   56 Text Interpretation:  Sinus rhythm Borderline low voltage, extremity leads Nonspecific T abnormalities, anterior leads Prolonged QT interval Nonspecific TW abnormalities new in comparison to prior Confirmed by Gareth Morgan [phone]) on 01/28/2019 10:51:19 AM   Radiology Ct Angio Chest Pe W And/or Wo Contrast  Result Date: 01/28/2019 CLINICAL DATA:  Chest pain, shortness of breath.  DVT EXAM: CT ANGIOGRAPHY CHEST WITH CONTRAST TECHNIQUE: Multidetector CT imaging of the chest was performed using the standard protocol during bolus administration of intravenous contrast. Multiplanar CT image reconstructions and MIPs were obtained to evaluate the vascular anatomy. CONTRAST:  153mL OMNIPAQUE IOHEXOL 350 MG/ML SOLN COMPARISON:  CT 08/27/2014.  Chest x-ray 01/28/2019 FINDINGS: Cardiovascular: Pulmonary vasculature is adequately opacified. Numerous filling defects within the lobar and segmental branches involving all pulmonary lobes. No main branch filling defect or saddle embolism. Heart size is normal. Leftward bowing of the interventricular septum and RV-LV ratio of just greater than 1.0 suggest right heart strain. No pericardial effusion. Scattered atherosclerotic calcifications of the aorta and coronary arteries. Mediastinum/Nodes: No enlarged mediastinal, hilar, or axillary lymph nodes. Thyroid Gland and trachea demonstrate no significant findings. Small hiatal hernia.  Lungs/Pleura: No focal airspace consolidation. Mild bibasilar linear scarring/atelectasis. No pleural effusion or pneumothorax. Upper Abdomen: No acute abnormality. Musculoskeletal: No acute osseous findings. Bilateral breast prostheses. Review of the MIP images confirms the above findings. IMPRESSION: Extensive acute pulmonary emboli involving the lobar and segmental branches of all pulmonary lobes.  No main branch or saddle embolism. There is CT evidence of right heart strain. These results were called by telephone at the time of interpretation on 01/28/2019 at 12:10 pm to Dr. Gareth Morgan , who verbally acknowledged these results. Electronically Signed   By: Davina Poke M.D.   On: 01/28/2019 12:11   Dg Chest Portable 1 View  Result Date: 01/28/2019 CLINICAL DATA:  Shortness of breath. EXAM: PORTABLE CHEST 1 VIEW COMPARISON:  Radiographs of December 26, 2018. FINDINGS: Stable cardiomediastinal silhouette. No pneumothorax or pleural effusion is noted. Both lungs are clear. The visualized skeletal structures are unremarkable. IMPRESSION: No active disease. Electronically Signed   By: Marijo Conception M.D.   On: 01/28/2019 10:10    Procedures .Critical Care Performed by: Gareth Morgan, MD Authorized by: Gareth Morgan, MD   Critical care provider statement:    Critical care time (minutes):  45   Critical care was time spent personally by me on the following activities:  Discussions with consultants, evaluation of patient's response to treatment, examination of patient, ordering and performing treatments and interventions, ordering and review of laboratory studies, ordering and review of radiographic studies, pulse oximetry, re-evaluation of patient's condition, obtaining history from patient or surrogate and review of old charts   (including critical care time)  Medications Ordered in ED Medications  heparin ADULT infusion 100 units/mL (25000 units/266mL sodium chloride 0.45%) (1,200 Units/hr  Intravenous New Bag/Given 01/28/19 1441)  mometasone-formoterol (DULERA) 200-5 MCG/ACT inhaler 2 puff (has no administration in time range)  cephALEXin (KEFLEX) capsule 250 mg (has no administration in time range)  cephALEXin (KEFLEX) capsule 500 mg (500 mg Oral Not Given 01/28/19 1642)  LORazepam (ATIVAN) tablet 0.5-1 mg (has no administration in time range)  traZODone (DESYREL) tablet 100 mg (has no administration in time range)  pantoprazole (PROTONIX) EC tablet 40 mg (40 mg Oral Given 01/28/19 1641)  cyclobenzaprine (FLEXERIL) tablet 10 mg (10 mg Oral Not Given 01/28/19 1642)  albuterol (PROVENTIL) (2.5 MG/3ML) 0.083% nebulizer solution 2.5 mg (2.5 mg Nebulization Given 01/28/19 1412)  loratadine (CLARITIN) tablet 10 mg (10 mg Oral Not Given 01/28/19 1642)  sodium chloride flush (NS) 0.9 % injection 3 mL (has no administration in time range)  oxyCODONE (Oxy IR/ROXICODONE) immediate release tablet 10 mg (has no administration in time range)  potassium chloride SA (K-DUR) CR tablet 20 mEq (has no administration in time range)  HYDROcodone-acetaminophen (NORCO/VICODIN) 5-325 MG per tablet 1.5 tablet (1.5 tablets Oral Given 01/28/19 1118)  iohexol (OMNIPAQUE) 350 MG/ML injection 100 mL (100 mLs Intravenous Contrast Given 01/28/19 1158)     Initial Impression / Assessment and Plan / ED Course  I have reviewed the triage vital signs and the nursing notes.  Pertinent labs & imaging results that were available during my care of the patient were reviewed by me and considered in my medical decision making (see chart for details).        78 year old female with a history of COPD, recent admission with discharge on August 20 for a left-sided retroperitoneal hematoma and acute DVT of the left lower extremity with IVC filter placement, who presents with concern for 3 to 4 days of shortness of breath, continuing pain and swelling of the left lower extremity.  Distal pulses present left lower extremity, low suspicion  for acute arterial occlusion.  No signs of cellulitis.  Suspect ongoing pain and swelling is secondary to known DVT, and discussed recommendations with vascular surgery who came to bedside and will consider further  intervention while she is in the hospital.   Regarding dyspnea, differential diagnosis includes pneumonia, pulmonary embolus, COPD exacerbation, anemia or pneumothorax.  Labs show no evidence of anemia.  Chest x-ray without acute abnormalities.  Given patient is high risk for pulmonary embolus with no DVT, ordered CT angios PE study which shows multiple pulmonary emboli, new per reoprt from recent PE study on Thursday.  Consulted pulmonology as well as vascular given complexity of decision regarding anticoagulation in setting of known retroperitoneal hematoma.  In discussion with Dr. Evangeline Gula, hospitalist who spoke with pulmonology, plan to give 5 days of heparin and monitor for signs of bleeding. Admitted for further care.   Final Clinical Impressions(s) / ED Diagnoses   Final diagnoses:  Acute pulmonary embolism without acute cor pulmonale, unspecified pulmonary embolism type [facility])  Retroperitoneal hematoma    ED Discharge Orders    None       Gareth Morgan, MD 01/28/19 2050

## 2019-01-28 NOTE — Consult Note (Signed)
[facility] [place]  Patient name: [name] MRN: [id number] DOB: 1940/09/22 Sex: female   REQUESTING PROVIDER:   ER   REASON FOR CONSULT:    DVT/PE  HISTORY OF PRESENT ILLNESS:   [name] is a 78 y.o. female, who was admitted on 01/13/2019 with a retroperitoneal hematoma as well as a DVT.  An IVC filter was placed.  She was not anticoagulated because of her retroperitoneal hematoma.  She had a chest CT scan at [facility] a few days ago which was unremarkable.  She is now having chest pain and shortness of breath and CT scan shows subsegmental PE.  She is also complaining of pain and swelling in her left leg.  She has been wearing compression.  PAST MEDICAL HISTORY    Past Medical History:  Diagnosis Date  . Allergic rhinitis   . Asthma   . Chronic airway obstruction, not elsewhere classified   . Depressive disorder, not elsewhere classified   . Esophageal reflux      FAMILY HISTORY   Family History  Problem Relation Age of Onset  . Diabetes Mother   . Coronary artery disease Mother   . Emphysema Father   . Coronary artery disease Brother     SOCIAL HISTORY:   Social History   Socioeconomic History  . Marital status: Divorced    Spouse name: Not on file  . Number of children: Not on file  . Years of education: Not on file  . Highest education level: Not on file  Occupational History  . Occupation: Disabled  Social Needs  . Financial resource strain: Not on file  . Food insecurity    Worry: Not on file    Inability: Not on file  . Transportation needs    Medical: Not on file    Non-medical: Not on file  Tobacco Use  . Smoking status: Former Smoker    Packs/day: 0.25    Years: 40.00    Pack years: 10.00    Types: Cigarettes    Quit date: 05/27/2013    Years since quitting: 5.6  . Smokeless tobacco: Never Used  Substance and Sexual Activity  . Alcohol use: No  . Drug use: No  . Sexual  activity: Not on file  Lifestyle  . Physical activity    Days per week: Not on file    Minutes per session: Not on file  . Stress: Not on file  Relationships  . Social Herbalist on phone: Not on file    Gets together: Not on file    Attends religious service: Not on file    Active member of club or organization: Not on file    Attends meetings of clubs or organizations: Not on file    Relationship status: Not on file  . Intimate partner violence    Fear of current or ex partner: Not on file    Emotionally abused: Not on file    Physically abused: Not on file    Forced sexual activity: Not on file  Other Topics Concern  . Not on file  Social History Narrative  . Not on file    ALLERGIES:    Allergies  Allergen Reactions  . Clarithromycin Swelling  . Penicillins Swelling    Tongue swelling Did it involve swelling of the face/tongue/throat, SOB, or low BP? Yes Did it involve sudden or severe rash/hives, skin peeling, or any reaction on the inside of your mouth  or nose? Yes Did you need to seek medical attention at a hospital or doctor's office? Yes When did it last happen?childhood If all above answers are "NO", may proceed with cephalosporin use.   . Pneumococcal Vaccines Other (See Comments)    Couldn't breathe  . Sulfonamide Derivatives Swelling  . Estradiol Rash    Cream gives her a rash    CURRENT MEDICATIONS:    Current Facility-Administered Medications  Medication Dose Route Frequency Provider Last Rate Last Dose  . albuterol (PROVENTIL) (2.5 MG/3ML) 0.083% nebulizer solution 2.5 mg  2.5 mg Nebulization Q6H Lady Deutscher, MD   2.5 mg at 01/28/19 1412  . [START ON 01/30/2019] cephALEXin (KEFLEX) capsule 250 mg  250 mg Oral Daily Lady Deutscher, MD      . cephALEXin [facility]) capsule 500 mg  500 mg Oral Q12H Lady Deutscher, MD      . cyclobenzaprine (FLEXERIL) tablet 10 mg  10 mg Oral TID Lady Deutscher, MD      . heparin ADULT  infusion 100 units/mL (25000 units/255mL sodium chloride 0.45%)  1,200 Units/hr Intravenous Continuous Rumbarger, Valeda Malm, RPH 12 mL/hr at 01/28/19 1441 1,200 Units/hr at 01/28/19 1441  . loratadine (CLARITIN) tablet 10 mg  10 mg Oral Daily Lady Deutscher, MD      . LORazepam (ATIVAN) tablet 0.5-1 mg  0.5-1 mg Oral QHS Lady Deutscher, MD      . mometasone-formoterol [facility]) 200-5 MCG/ACT inhaler 2 puff  2 puff Inhalation BID Lady Deutscher, MD      . oxyCODONE (Oxy IR/ROXICODONE) immediate release tablet 10 mg  10 mg Oral Q4H PRN Lady Deutscher, MD      . pantoprazole (PROTONIX) EC tablet 40 mg  40 mg Oral Daily Lady Deutscher, MD   40 mg at 01/28/19 1641  . potassium chloride SA (K-DUR) CR tablet 20 mEq  20 mEq Oral BID Lady Deutscher, MD      . sodium chloride flush (NS) 0.9 % injection 3 mL  3 mL Intravenous Q12H Lady Deutscher, MD      . traZODone (DESYREL) tablet 100 mg  100 mg Oral QHS Lady Deutscher, MD        REVIEW OF SYSTEMS:   [X]  denotes positive finding, [ ]  denotes negative finding Cardiac  Comments:  Chest pain or chest pressure: x   Shortness of breath upon exertion:    Short of breath when lying flat: x   Irregular heart rhythm:        Vascular    Pain in calf, thigh, or hip brought on by ambulation:    Pain in feet at night that wakes you up from your sleep:     Blood clot in your veins:    Leg swelling:         Pulmonary    Oxygen at home:    Productive cough:     Wheezing:         Neurologic    Sudden weakness in arms or legs:     Sudden numbness in arms or legs:     Sudden onset of difficulty speaking or slurred speech:    Temporary loss of vision in one eye:     Problems with dizziness:         Gastrointestinal    Blood in stool:      Vomited blood:         Genitourinary    Burning when  urinating:     Blood in urine:        Psychiatric    Major depression:         Hematologic    Bleeding problems:    Problems  with blood clotting too easily:        Skin    Rashes or ulcers:        Constitutional    Fever or chills:     PHYSICAL EXAM:   Vitals:   01/28/19 1515 01/28/19 1530 01/28/19 1545 01/28/19 1619  BP: 123/83 (!) 136/99 (!) 127/94 132/88  Pulse: (!) 140 92 95 93  Resp: (!) 35 (!) 24 18 20   Temp:    97.9 F (36.6 C)  TempSrc:    Oral  SpO2: (!) 77% 96% 92% 98%  Weight:      Height:        GENERAL: The patient is a well-nourished female, in no acute distress. The vital signs are documented above. CARDIAC: There is a regular rate and rhythm.  VASCULAR: Edematous and tender left leg PULMONARY: Nonlabored respirations ABDOMEN: Soft and non-tender with normal pitched bowel sounds.  MUSCULOSKELETAL: There are no major deformities or cyanosis. NEUROLOGIC: No focal weakness or paresthesias are detected. SKIN: There are no ulcers or rashes noted. PSYCHIATRIC: The patient has a normal affect.  STUDIES:   I have reviewed her CT scan of the chest which shows subsegmental PE.  ASSESSMENT and PLAN   PE/DVT: Given the patient's new symptoms, and the fact that it is been 2 weeks since her retroperitoneal hematoma, I think the most appropriate course of action is admission for IV heparin with close monitoring of her blood counts.  I suspect her PE symptoms will improve with heparin.  We will consider intervention for her DVT while she is in the hospital.    Annamarie Major, IV, MD, FACS [facility] [phone] Pager [phone]

## 2019-01-28 NOTE — H&P (Signed)
History and Physical    [name] [id number] DOB: 20-Aug-1940 DOA: 01/28/2019  PCP: Deland Pretty, MD  Patient coming from: home  I have personally briefly reviewed patient's old medical records in [place]  Chief Complaint: Left leg pain and swelling with shortness of breath that developed this morning  HPI: [name] is a 78 y.o. female with medical history significant of left lower extremity deep venous thrombosis complicated by a retroperitoneal bleed therefore unable to get anticoagulation.  IVC filter was placed on 01/13/2019 and the patient was discharged home.  Was not anticoagulated because of the significant retroperitoneal bleeding.  Aspirin was recommended by vascular surgery.  He is due to follow-up with vascular surgery on Friday but presented today because of acute shortness of breath.  In the meantime she was seen by her primary care physician at [facility] who ordered a CTA of the chest last Thursday which was negative for pulmonary embolism.  Has been taking medication for pain in the left lower extremity but continues to have significant pain the leg feels very heavy, is difficult for her to ambulate.  She has been wearing her compression hose.  Shortness of breath has developed over the past 3 to 4 days and worse with exertion.  He had a brief episode of chest pain early on after the procedure but has not had any since.  She has not been wheezing nor does she have cough, sputum production, fever, chills, orthopnea, PND, or exacerbation of her COPD.  On arrival to the emergency department she was hypoxic started on 6 L nasal cannula oxygen I have been able to wean it down to approximately 3-4.  ED Course: CT scan of the chest was obtained today and showed extensive pulmonary emboli with heart strain and patient was referred to me for further evaluation and management.  Review of Systems: As per HPI otherwise all other systems reviewed and  negative.   Past Medical  History:  Diagnosis Date  . Allergic rhinitis   . Asthma   . Chronic airway obstruction, not elsewhere classified   . Depressive disorder, not elsewhere classified   . Esophageal reflux     Past Surgical History:  Procedure Laterality Date  . ABDOMINAL HYSTERECTOMY    . CYSTOSCOPY    . NASAL SINUS SURGERY    . VENA CAVA FILTER PLACEMENT N/A 01/13/2019   Procedure: INSERTION VENA-CAVA FILTER;  Surgeon: Waynetta Sandy, MD;  Location: [place];  Service: Vascular;  Laterality: N/A;    Social History   Social History Narrative  . Not on file     reports that she quit smoking about 5 years ago. Her smoking use included cigarettes. She has a 10.00 pack-year smoking history. She has never used smokeless tobacco. She reports that she does not drink alcohol or use drugs.  Allergies  Allergen Reactions  . Clarithromycin Swelling  . Penicillins Swelling    Tongue swelling Did it involve swelling of the face/tongue/throat, SOB, or low BP? Yes Did it involve sudden or severe rash/hives, skin peeling, or any reaction on the inside of your mouth or nose? Yes Did you need to seek medical attention at a hospital or doctor's office? Yes When did it last happen?childhood If all above answers are "NO", may proceed with cephalosporin use.   . Pneumococcal Vaccines Other (See Comments)    Couldn't breathe  . Sulfonamide Derivatives Swelling  . Estradiol Rash    Cream gives her a rash  Family History  Problem Relation Age of Onset  . Diabetes Mother   . Coronary artery disease Mother   . Emphysema Father   . Coronary artery disease Brother      Prior to Admission medications   Medication Sig Start Date End Date Taking? Authorizing Provider  albuterol (PROVENTIL) (2.5 MG/3ML) 0.083% nebulizer solution Take 3 mLs (2.5 mg total) by nebulization every 6 (six) hours. 09/24/14  Yes Young, Tarri Fuller D, MD  albuterol (VENTOLIN HFA) 108 (90 Base) MCG/ACT inhaler Inhale 2 puffs into  the lungs every 6 (six) hours as needed for wheezing or shortness of breath. 12/26/18  Yes Baird Lyons D, MD  aspirin EC 81 MG EC tablet Take 1 tablet (81 mg total) by mouth daily. 01/17/19  Yes Bonnielee Haff, MD  cephALEXin (KEFLEX) 250 MG capsule Take 250 mg by mouth daily. 12/04/18  Yes [provider]  cetirizine (ZYRTEC) 10 MG tablet Take 10 mg by mouth as needed for allergies.    Yes [provider]  cyclobenzaprine (FLEXERIL) 10 MG tablet Take 10 mg by mouth 3 (three) times daily. 01/10/19  Yes [provider]  Glycopyrrolate-Formoterol (BEVESPI AEROSPHERE) 9-4.8 MCG/ACT AERO Inhale 2 puffs into the lungs 2 (two) times a day. 12/26/18  Yes Young, Tarri Fuller D, MD  HYDROcodone-acetaminophen (NORCO) 7.5-325 MG tablet Take 1 tablet by mouth every 6 (six) hours as needed for moderate pain. 01/16/19  Yes Bonnielee Haff, MD  LORazepam (ATIVAN) 1 MG tablet Take 0.5-1 mg by mouth at bedtime.   Yes [provider]  omeprazole (PRILOSEC) 20 MG capsule Take 1 tablet by mouth daily.  09/13/11  Yes [provider]  traZODone (DESYREL) 100 MG tablet Take 100 mg by mouth at bedtime.    Yes [provider]  cephALEXin (KEFLEX) 500 MG capsule Take 500 mg by mouth every 12 (twelve) hours. 01/22/19   [provider]  Fluticasone-Salmeterol (ADVAIR) 250-50 MCG/DOSE AEPB INHALE 1 DOSE BY MOUTH TWICE DAILY, RINSE MOUTH AFTER USE Patient taking differently: Inhale 1 puff into the lungs 2 (two) times daily.  09/09/18   Deneise Lever, MD    Physical Exam:  Constitutional: NAD, calm, slightly uncomfortable, speaking in full sentences appears fatigued and tired Vitals:   01/28/19 1515 01/28/19 1530 01/28/19 1545 01/28/19 1619  BP: 123/83 (!) 136/99 (!) 127/94 132/88  Pulse: (!) 140 92 95 93  Resp: (!) 35 (!) 24 18 20   Temp:    97.9 F (36.6 C)  TempSrc:    Oral  SpO2: (!) 77% 96% 92% 98%  Weight:      Height:       Eyes: PERRL, lids and  conjunctivae normal ENMT: Mucous membranes are moist. Posterior pharynx clear of any exudate or lesions.Normal dentition.  Neck: normal, supple, no masses, no thyromegaly Respiratory: Breath sounds bilaterally, no wheezing, no crackles.  Moderately increased respiratory effort. No accessory muscle use.  Cardiovascular: Regular rate and rhythm, no murmurs / rubs / gallops. No extremity edema. 2+ pedal pulses. No carotid bruits.  Abdomen: no tenderness, no masses palpated. No hepatosplenomegaly. Bowel sounds positive.  Musculoskeletal: no clubbing / cyanosis. No joint deformity upper and lower extremities. Good ROM, no contractures. Normal muscle tone.  Left lower extremity with significantly increased girth pain with movement and pain with trying to ambulate on it. Skin: no rashes, lesions, ulcers. No induration Neurologic: CN 2-12 grossly intact. Sensation intact, DTR normal. Strength 5/5 in all 4.  Psychiatric: Normal judgment and insight. Alert and  oriented x 3. Normal mood.    Labs on Admission: I have personally reviewed following labs and imaging studies  CBC: Recent Labs  Lab 01/28/19 1012 01/28/19 1641  WBC 11.7*  --   NEUTROABS 9.8*  --   HGB 12.4 11.2*  HCT 41.0 36.5  MCV 98.3  --   PLT 482*  --    Basic Metabolic Panel: Recent Labs  Lab 01/28/19 1012  NA 141  K 3.4*  CL 104  CO2 24  GLUCOSE 110*  BUN 11  CREATININE 0.87  CALCIUM 8.7*   GFR: Estimated Creatinine Clearance: 59.3 mL/min (by C-G formula based on SCr of 0.87 mg/dL). Liver Function Tests: Recent Labs  Lab 01/28/19 1012  AST 29  ALT 19  ALKPHOS 88  BILITOT 1.5*  PROT 6.7  ALBUMIN 3.5    Radiological Exams on Admission: Ct Angio Chest Pe W And/or Wo Contrast  Result Date: 01/28/2019 CLINICAL DATA:  Chest pain, shortness of breath.  DVT EXAM: CT ANGIOGRAPHY CHEST WITH CONTRAST TECHNIQUE: Multidetector CT imaging of the chest was performed using the standard protocol during bolus administration  of intravenous contrast. Multiplanar CT image reconstructions and MIPs were obtained to evaluate the vascular anatomy. CONTRAST:  155mL OMNIPAQUE IOHEXOL 350 MG/ML SOLN COMPARISON:  CT 08/27/2014.  Chest x-ray 01/28/2019 FINDINGS: Cardiovascular: Pulmonary vasculature is adequately opacified. Numerous filling defects within the lobar and segmental branches involving all pulmonary lobes. No main branch filling defect or saddle embolism. Heart size is normal. Leftward bowing of the interventricular septum and RV-LV ratio of just greater than 1.0 suggest right heart strain. No pericardial effusion. Scattered atherosclerotic calcifications of the aorta and coronary arteries. Mediastinum/Nodes: No enlarged mediastinal, hilar, or axillary lymph nodes. Thyroid Gland and trachea demonstrate no significant findings. Small hiatal hernia. Lungs/Pleura: No focal airspace consolidation. Mild bibasilar linear scarring/atelectasis. No pleural effusion or pneumothorax. Upper Abdomen: No acute abnormality. Musculoskeletal: No acute osseous findings. Bilateral breast prostheses. Review of the MIP images confirms the above findings. IMPRESSION: Extensive acute pulmonary emboli involving the lobar and segmental branches of all pulmonary lobes. No main branch or saddle embolism. There is CT evidence of right heart strain. These results were called by telephone at the time of interpretation on 01/28/2019 at 12:10 pm to Dr. Gareth Morgan , who verbally acknowledged these results. Electronically Signed   By: Davina Poke M.D.   On: 01/28/2019 12:11   Dg Chest Portable 1 View  Result Date: 01/28/2019 CLINICAL DATA:  Shortness of breath. EXAM: PORTABLE CHEST 1 VIEW COMPARISON:  Radiographs of December 26, 2018. FINDINGS: Stable cardiomediastinal silhouette. No pneumothorax or pleural effusion is noted. Both lungs are clear. The visualized skeletal structures are unremarkable. IMPRESSION: No active disease. Electronically Signed   By:  Marijo Conception M.D.   On: 01/28/2019 10:10    EKG: Independently reviewed.  Sinus rhythm Borderline low voltage, extremity leads Nonspecific T abnormalities, anterior leads Prolonged QT interval Nonspecific TW abnormalities new in comparison to prior   Assessment/Plan Principal Problem:   Acute pulmonary embolism with acute cor pulmonale (HCC) Active Problems:   Acute respiratory failure with hypoxia (HCC)   DVT, recurrent, lower extremity, chronic, left (HCC)   Retroperitoneal bleed   Hypokalemia    1.  Acute pulmonary embolism with acute cor pulmonale: This was noted on CT scan.  I have significant concern given her heart strain.  There is extensive pulmonary emboli.  CT scan suggested cor pulmonale and a stat echocardiogram was obtained was not  available at the time of this dictation.  This is quite a difficult situation.  I called pulmonary and discussed the case with Dr. Vaughan Browner who basically said there is no good choice here.  Discussion with the patient and her son regarding options was had.  Since discussed were  A: Continue to monitor overnight in the hospital without anticoagulation and information from echocardiogram.  B: Admit the patient to the hospital and start her on IV heparin checking H&H's every 6 hours.  If there is a drop in hemoglobin we will discontinue heparin.  Many years ago the PIOPED study did uphold the value of 5 days of IV heparin without further anticoagulation.  In this case patient will be monitored in the hospital but she will still receive some anticoagulation given her very difficult situation regarding significant leg pain and edema as well as shortness of breath in a patient who is already compromised from underlying COPD.  C: Attempt anticoagulant with oral anticoagulation.  Risks include difficulty in reversing agent as well as possible significant bleeding requiring blood transfusion.  D: Tempt anticoagulation at home with subcu Lovenox which has a  slightly shorter half-life if patient develops symptoms would recommend discontinuing.  After extensive discussion with the patient and her son in consideration of the risks and benefits they have opted for 5 days in the hospital on IV heparin with monitoring of H&H.  Both the patient and her son understand there are inherent risks in giving her heparin given her recent retroperitoneal bleeding but feel that an attempt to try to able lyse the clot in her leg and prevent further pulmonary emboli will help her feel better.  My concern of course is that if we do not think she will continue to have small pulmonary emboli and will likely to need to have deterioration of her pulmonary function ending up permanently on oxygen.  Please note patient was on 0 oxygen at home prior to admission.  2.  Acute respiratory failure with hypoxia: Initially requiring 6 L of oxygen.  We have been able to wean it down some however I am concerned that the patient may be continuing to have shower emboli.  We will continue oxygen and titrate to keep sats greater than 92%.  3.  Deep venous thrombosis of the lower extremity: Extensive discussion as above.  4.  Recent retroperitoneal bleeding: Need to monitor H&H every 6 hours as above.  5.  Hypokalemia: Replete orally.  DVT prophylaxis: Fully anticoagulated Code Status: Full code Family Communication: Spoke with patient's son at length who was present at the time of admission Disposition Plan: Likely home in 5 days Consults called: None spoke briefly with PCCM regarding management options Admission status: Admit - It is my clinical opinion that admission to INPATIENT is reasonable and necessary because of the expectation that this patient will require hospital care that crosses at least 2 midnights to treat this condition based on the medical complexity of the problems presented.  Given the aforementioned information, the predictability of an adverse outcome is felt to be  significant.    Lady Deutscher MD FACP [facility] Pager [phone]  How to contact the [facility] Attending or Consulting provider [place] or covering provider during after hours [place], for this patient?  1. Check the care team in [facility] and look for a) attending/consulting [facility] provider listed and b) the [facility] team listed 2. Log into [email or web address] and use [place]'s universal password to access. If  you do not have the password, please contact the hospital operator. 3. Locate the [facility] provider you are looking for under [facility] and page to a number that you can be directly reached. 4. If you still have difficulty reaching the provider, please page the [facility] (Director on Call) for the Hospitalists listed on amion for assistance.  If 7PM-7AM, please contact night-coverage [email or web address] Password [facility]  01/28/2019, 6:49 PM

## 2019-01-28 NOTE — Progress Notes (Signed)
ANTICOAGULATION CONSULT NOTE - Initial Consult  Pharmacy Consult for heparin Indication: pulmonary embolus  Allergies  Allergen Reactions  . Clarithromycin Swelling  . Penicillins Swelling    Tongue swelling Did it involve swelling of the face/tongue/throat, SOB, or low BP? Yes Did it involve sudden or severe rash/hives, skin peeling, or any reaction on the inside of your mouth or nose? Yes Did you need to seek medical attention at a hospital or doctor's office? Yes When did it last happen?childhood If all above answers are "NO", may proceed with cephalosporin use.   . Pneumococcal Vaccines Other (See Comments)    Couldn't breathe  . Sulfonamide Derivatives Swelling  . Estradiol Rash    Cream gives her a rash    Patient Measurements: Height: 5\' 6"  (167.6 cm) Weight: 174 lb 2.6 oz (79 kg) IBW/kg (Calculated) : 59.3 Heparin Dosing Weight: 75.6kg  Vital Signs: Temp: 98.3 F (36.8 C) (09/01 0911) Temp Source: Oral (09/01 0911) BP: 150/94 (09/01 0911) Pulse Rate: 91 (09/01 1205)  Labs: Recent Labs    01/28/19 1012  HGB 12.4  HCT 41.0  PLT 482*  CREATININE 0.87  TROPONINIHS 9    Estimated Creatinine Clearance: 59.3 mL/min (by C-G formula based on SCr of 0.87 mg/dL).   Medical History: Past Medical History:  Diagnosis Date  . Allergic rhinitis   . Asthma   . Chronic airway obstruction, not elsewhere classified   . Depressive disorder, not elsewhere classified   . Esophageal reflux     Medications:  Infusions:  . heparin 1,200 Units/hr (01/28/19 1441)    Assessment: 29 yof presented to the ED with leg pain and SOB. Pt was recently diagnosed with a DVT but also had a retroperitoneal bleed. An IVC filter was placed and she was not started on anticoagulation. Today she presents with an acute PE. To start IV heparin. Hgb is WNL and platelets are elevated.   Goal of Therapy:  Heparin level 0.3-0.7 units/ml Monitor platelets by anticoagulation protocol: Yes   Plan:  Heparin gtt 1200 units/hr - not bolusing for now d/t recent bleeding Check an 8 hr heparin level Daily heparin level and CBC  Judea Fennimore, Rande Lawman 01/28/2019,1:48 PM

## 2019-01-29 DIAGNOSIS — I2699 Other pulmonary embolism without acute cor pulmonale: Secondary | ICD-10-CM

## 2019-01-29 HISTORY — DX: Other pulmonary embolism without acute cor pulmonale: I26.99

## 2019-01-29 LAB — CBC
HCT: 38.3 % (ref 36.0–46.0)
Hemoglobin: 11.3 g/dL — ABNORMAL LOW (ref 12.0–15.0)
MCH: 29.5 pg (ref 26.0–34.0)
MCHC: 29.5 g/dL — ABNORMAL LOW (ref 30.0–36.0)
MCV: 100 fL (ref 80.0–100.0)
Platelets: 474 10*3/uL — ABNORMAL HIGH (ref 150–400)
RBC: 3.83 MIL/uL — ABNORMAL LOW (ref 3.87–5.11)
RDW: 15.7 % — ABNORMAL HIGH (ref 11.5–15.5)
WBC: 8.4 10*3/uL (ref 4.0–10.5)
nRBC: 0 % (ref 0.0–0.2)

## 2019-01-29 LAB — BASIC METABOLIC PANEL
Anion gap: 11 (ref 5–15)
BUN: 16 mg/dL (ref 8–23)
CO2: 27 mmol/L (ref 22–32)
Calcium: 8.5 mg/dL — ABNORMAL LOW (ref 8.9–10.3)
Chloride: 102 mmol/L (ref 98–111)
Creatinine, Ser: 0.84 mg/dL (ref 0.44–1.00)
GFR calc Af Amer: >60 mL/min (ref >60)
GFR calc non Af Amer: >60 mL/min (ref >60)
Glucose, Bld: 108 mg/dL — ABNORMAL HIGH (ref 70–99)
Potassium: 3.5 mmol/L (ref 3.5–5.1)
Sodium: 140 mmol/L (ref 135–145)

## 2019-01-29 LAB — HEMOGLOBIN AND HEMATOCRIT, BLOOD
HCT: 36.1 % (ref 36.0–46.0)
HCT: 35.3 % — ABNORMAL LOW (ref 36.0–46.0)
HCT: 36.9 % (ref 36.0–46.0)
Hemoglobin: 10.6 g/dL — ABNORMAL LOW (ref 12.0–15.0)
Hemoglobin: 10.6 g/dL — ABNORMAL LOW (ref 12.0–15.0)
Hemoglobin: 11 g/dL — ABNORMAL LOW (ref 12.0–15.0)

## 2019-01-29 LAB — HEPARIN LEVEL (UNFRACTIONATED): Heparin Unfractionated: 0.44 IU/mL (ref 0.30–0.70)

## 2019-01-29 MED ORDER — ALBUTEROL SULFATE (2.5 MG/3ML) 0.083% IN NEBU
2.5000 mg | INHALATION_SOLUTION | Freq: Two times a day (BID) | RESPIRATORY_TRACT | Status: DC
  Administered 2019-01-29 – 2019-01-30 (×3): 2.5 mg via RESPIRATORY_TRACT
  Filled 2019-01-29 (×3): qty 3

## 2019-01-29 MED ORDER — ALBUTEROL SULFATE (2.5 MG/3ML) 0.083% IN NEBU
2.5000 mg | INHALATION_SOLUTION | Freq: Three times a day (TID) | RESPIRATORY_TRACT | Status: DC
  Administered 2019-01-29: 2.5 mg via RESPIRATORY_TRACT
  Filled 2019-01-29: qty 3

## 2019-01-29 MED ORDER — CYCLOBENZAPRINE HCL 10 MG PO TABS: 10.0000 mg | ORAL_TABLET | Freq: Three times a day (TID) | ORAL | Status: DC | PRN

## 2019-01-29 NOTE — Plan of Care (Signed)
Poc progressing.  

## 2019-01-29 NOTE — Progress Notes (Signed)
X-large compression hose applied to patient's left leg per order.

## 2019-01-29 NOTE — Progress Notes (Addendum)
  Progress Note    01/29/2019 7:59 AM  Subjective: Believes L thigh edema has improved some since IV heparin was started   Vitals:   01/29/19 0023 01/29/19 0438  BP: 109/76 131/87  Pulse: (!) 101 85  Resp: 20 18  Temp: 97.9 F (36.6 C) 98 F (36.7 C)  SpO2: 98% 95%   Physical Exam: Lungs:  Non labored Extremities:  LLE edema to level of thigh, no pitting edema Neurologic: A&O  CBC    Component Value Date/Time   WBC 8.4 01/29/2019 0716   RBC 3.83 (L) 01/29/2019 0716   HGB 11.3 (L) 01/29/2019 0716   HCT 38.3 01/29/2019 0716   PLT 474 (H) 01/29/2019 0716   MCV 100.0 01/29/2019 0716   MCH 29.5 01/29/2019 0716   MCHC 29.5 (L) 01/29/2019 0716   RDW 15.7 (H) 01/29/2019 0716   LYMPHSABS 0.8 01/28/2019 1012   MONOABS 0.9 01/28/2019 1012   EOSABS 0.1 01/28/2019 1012   BASOSABS 0.0 01/28/2019 1012    BMET    Component Value Date/Time   NA 141 01/28/2019 1012   K 3.4 (L) 01/28/2019 1012   CL 104 01/28/2019 1012   CO2 24 01/28/2019 1012   GLUCOSE 110 (H) 01/28/2019 1012   BUN 11 01/28/2019 1012   CREATININE 0.87 01/28/2019 1012   CALCIUM 8.7 (L) 01/28/2019 1012   GFRNONAA >60 01/28/2019 1012   GFRAA >60 01/28/2019 1012    INR    Component Value Date/Time   INR 1.1 01/13/2019 1458     Intake/Output Summary (Last 24 hours) at 01/29/2019 0759 Last data filed at 01/29/2019 0000 Gross per 24 hour  Intake 212.22 ml  Output -  Net 212.22 ml     Assessment/Plan:  78 y.o. female with likely spontaneous retroperitoneal hematoma causing extensive DVT LLE; IVC filter placed 01/13/19.  Patient now with PE  LLE feeling better since IV heparin started; monitor H&H SOB improved Continue compression sock [name] will discuss thrombectomy with patient  Dagoberto Ligas, PA-C [facility] [phone] 01/29/2019 7:59 AM  I have independently interviewed and examined patient and agree with PA assessment and plan above.  Edema appears to be improving on  heparin.  Continue to trend CBC make sure that she tolerates anticoagulation likely will not need intervention on her lower extremity and can plan to remove filter after she is tolerated anticoagulation for several weeks.  Keryl Gholson C. Donzetta Matters, MD [facility] [place] Office: [phone] Pager: [phone]  '

## 2019-01-29 NOTE — Progress Notes (Signed)
[place] for heparin Indication: pulmonary embolus  Allergies  Allergen Reactions  . Clarithromycin Swelling  . Penicillins Swelling    Tongue swelling Did it involve swelling of the face/tongue/throat, SOB, or low BP? Yes Did it involve sudden or severe rash/hives, skin peeling, or any reaction on the inside of your mouth or nose? Yes Did you need to seek medical attention at a hospital or doctor's office? Yes When did it last happen?childhood If all above answers are "NO", may proceed with cephalosporin use.   . Pneumococcal Vaccines Other (See Comments)    Couldn't breathe  . Sulfonamide Derivatives Swelling  . Estradiol Rash    Cream gives her a rash    Patient Measurements: Height: 5\' 6"  (167.6 cm) Weight: 174 lb 2.6 oz (79 kg) IBW/kg (Calculated) : 59.3 Heparin Dosing Weight: 75.6kg  Vital Signs: Temp: 98.5 F (36.9 C) (09/02 0802) Temp Source: Oral (09/02 0802) BP: 118/85 (09/02 0802) Pulse Rate: 107 (09/02 0802)  Labs: Recent Labs    01/28/19 1012 01/28/19 1641 01/28/19 2159 01/29/19 0230 01/29/19 0716  HGB 12.4 11.2* 11.4* 10.6* 11.3*  HCT 41.0 36.5 37.3 36.1 38.3  PLT 482*  --   --   --  474*  HEPARINUNFRC  --   --  0.26*  --  0.44  CREATININE 0.87  --   --   --  0.84  TROPONINIHS 9 8  --   --   --     Estimated Creatinine Clearance: 61.4 mL/min (by C-G formula based on SCr of 0.84 mg/dL).   Assessment: 78 y.o. female with PE / recent DVT continues on IV heparin Heparin level therapeutic this AM  Goal of Therapy:  Heparin level 0.3-0.7 units/ml Monitor platelets by anticoagulation protocol: Yes   Plan:  Continue heparin at 1350 units / hr Follow-up am labs / plan  Thank you Anette Guarneri, PharmD [phone]  01/29/2019,8:29 AM

## 2019-01-29 NOTE — Progress Notes (Signed)
PROGRESS NOTE    [name]  [id number] DOB: 01-11-1941 DOA: 01/28/2019 PCP: Deland Pretty, MD   Brief Narrative:  [name] is a 78 y.o. female with medical history significant of left lower extremity deep venous thrombosis complicated by a retroperitoneal bleed therefore unable to get anticoagulation.  IVC filter was placed on 01/13/2019 and the patient was discharged home.  Was not anticoagulated because of the significant retroperitoneal bleeding.  Aspirin was recommended by vascular surgery.  she was due to follow-up with vascular surgery on Friday but presented back to ER on 01/28/2019 because of acute shortness of breath.  In the meantime she was seen by her primary care physician at [facility] who ordered a CTA of the chest last Thursday which was negative for pulmonary embolism.  Has been taking medication for pain in the left lower extremity but continues to have significant pain the leg feels very heavy, is difficult for her to ambulate.  She has been wearing her compression hose.  Shortness of breath has developed over the past 3 to 4 days and worse with exertion. had a brief episode of chest pain early on after the procedure but has not had any since.  No other respiratory complaints.  She was placed on 6 L of nasal oxygen upon presentation which was tapered down to 3 L while she was still in the ER.  CT scan of the chest was done and she was found to have extensive pulmonary emboli with right heart strain.  She was admitted by hospital service and after a very long discussion we are risks and benefits were discussed with the patient and patient's son about anticoagulation and they chose to be started on heparin and she was then admitted to hospital service with the plan to continue IV heparin and watch her hemoglobin closely for at least 4 to 5 days in the hospital.  Assessment & Plan:   Principal Problem:   Acute pulmonary embolism with acute cor pulmonale (HCC) Active Problems:  Acute respiratory failure with hypoxia (HCC)   Retroperitoneal bleed   DVT, recurrent, lower extremity, chronic, left (HCC)   Hypokalemia   Left lower extremity DVT/massive bilateral PE with right heart strain/cor pulmonale: Normal ejection fraction.  Also normal right ventricle.  Vascular surgery has seen the patient as well.  They plan to do thrombectomy for left lower extremity DVT.  We will continue heparin for PE in the meantime and follow her H&H every 8 hours which has been stable so far.  Acute hypoxic respiratory failure: Secondary to PE.  Management as above.  She is down to 3 L nasal cannula.  Recent retroperitoneal bleeding: Hemoglobin is stable.  Continue H&H every 8 hours.  Hypokalemia: Resolved.  DVT prophylaxis: Heparin drip Code Status: Full code Family Communication:  None present at bedside.  Plan of care discussed with patient in length and he verbalized understanding and agreed with it. Disposition Plan: Will stay in the hospital for next few days.  Consultants:   Vascular surgery  Procedures:   None  Antimicrobials:   None   Subjective: Patient seen and examined.  She stated that she feels much better.  Very minimal shortness of breath but no chest pain.  Objective: Vitals:   01/29/19 0802 01/29/19 0827 01/29/19 0828 01/29/19 1219  BP: 118/85   108/72  Pulse: (!) 107   95  Resp: 20   14  Temp: 98.5 F (36.9 C)   98 F (36.7 C)  TempSrc:  Oral   Oral  SpO2: 95% 97% 97% 92%  Weight:      Height:        Intake/Output Summary (Last 24 hours) at 01/29/2019 1229 Last data filed at 01/29/2019 0918 Gross per 24 hour  Intake 457.64 ml  Output -  Net 457.64 ml   Filed Weights   01/28/19 1300  Weight: 79 kg    Examination:  General exam: Appears calm and comfortable  Respiratory system: Clear to auscultation. Respiratory effort normal. Cardiovascular system: S1 & S2 heard, RRR. No JVD, murmurs, rubs, gallops or clicks. No pedal edema.  Gastrointestinal system: Abdomen is nondistended, soft and nontender. No organomegaly or masses felt. Normal bowel sounds heard. Central nervous system: Alert and oriented. No focal neurological deficits. Extremities: Symmetric 5 x 5 power. Skin: No rashes, lesions or ulcers Psychiatry: Judgement and insight appear normal. Mood & affect appropriate.    Data Reviewed: I have personally reviewed following labs and imaging studies  CBC: Recent Labs  Lab 01/28/19 1012 01/28/19 1641 01/28/19 2159 01/29/19 0230 01/29/19 0716  WBC 11.7*  --   --   --  8.4  NEUTROABS 9.8*  --   --   --   --   HGB 12.4 11.2* 11.4* 10.6* 11.3*  HCT 41.0 36.5 37.3 36.1 38.3  MCV 98.3  --   --   --  100.0  PLT 482*  --   --   --  [id number]*   Basic Metabolic Panel: Recent Labs  Lab 01/28/19 1012 01/29/19 0716  NA 141 140  K 3.4* 3.5  CL 104 102  CO2 24 27  GLUCOSE 110* 108*  BUN 11 16  CREATININE 0.87 0.84  CALCIUM 8.7* 8.5*   GFR: Estimated Creatinine Clearance: 61.4 mL/min (by C-G formula based on SCr of 0.84 mg/dL). Liver Function Tests: Recent Labs  Lab 01/28/19 1012  AST 29  ALT 19  ALKPHOS 88  BILITOT 1.5*  PROT 6.7  ALBUMIN 3.5   No results for input(s): LIPASE, AMYLASE in the last 168 hours. No results for input(s): AMMONIA in the last 168 hours. Coagulation Profile: No results for input(s): INR, PROTIME in the last 168 hours. Cardiac Enzymes: No results for input(s): CKTOTAL, CKMB, CKMBINDEX, TROPONINI in the last 168 hours. BNP (last 3 results) No results for input(s): PROBNP in the last 8760 hours. HbA1C: No results for input(s): HGBA1C in the last 72 hours. CBG: No results for input(s): GLUCAP in the last 168 hours. Lipid Profile: No results for input(s): CHOL, HDL, LDLCALC, TRIG, CHOLHDL, LDLDIRECT in the last 72 hours. Thyroid Function Tests: No results for input(s): TSH, T4TOTAL, FREET4, T3FREE, THYROIDAB in the last 72 hours. Anemia Panel: No results for input(s):  VITAMINB12, FOLATE, FERRITIN, TIBC, IRON, RETICCTPCT in the last 72 hours. Sepsis Labs: No results for input(s): PROCALCITON, LATICACIDVEN in the last 168 hours.  Recent Results (from the past 240 hour(s))  SARS CORONAVIRUS 2 (TAT 6-24 HRS) Nasopharyngeal Nasopharyngeal Swab     Status: None   Collection Time: 01/28/19  3:01 PM   Specimen: Nasopharyngeal Swab  Result Value Ref Range Status   SARS Coronavirus 2 NEGATIVE NEGATIVE Final    Comment: (NOTE) SARS-CoV-2 target nucleic acids are NOT DETECTED. The SARS-CoV-2 RNA is generally detectable in upper and lower respiratory specimens during the acute phase of infection. Negative results do not preclude SARS-CoV-2 infection, do not rule out co-infections with other pathogens, and should not be used as the sole basis for treatment or  other patient management decisions. Negative results must be combined with clinical observations, patient history, and epidemiological information. The expected result is Negative. Fact Sheet for Patients: SugarRoll.be Fact Sheet for Healthcare Providers: https://www.woods-mathews.com/ This test is not yet approved or cleared by the [place] FDA and  has been authorized for detection and/or diagnosis of SARS-CoV-2 by FDA under an Emergency Use Authorization (EUA). This EUA will remain  in effect (meaning this test can be used) for the duration of the COVID-19 declaration under Section 56 4(b)(1) of the Act, 21 U.S.C. section 360bbb-3(b)(1), unless the authorization is terminated or revoked sooner. Performed at [facility], [place] [address] [place], [place] [address]       Radiology Studies: Ct Angio Chest Pe W And/or Wo Contrast  Result Date: 01/28/2019 CLINICAL DATA:  Chest pain, shortness of breath.  DVT EXAM: CT ANGIOGRAPHY CHEST WITH CONTRAST TECHNIQUE: Multidetector CT imaging of the chest was performed using the standard protocol during bolus  administration of intravenous contrast. Multiplanar CT image reconstructions and MIPs were obtained to evaluate the vascular anatomy. CONTRAST:  131mL OMNIPAQUE IOHEXOL 350 MG/ML SOLN COMPARISON:  CT 08/27/2014.  Chest x-ray 01/28/2019 FINDINGS: Cardiovascular: Pulmonary vasculature is adequately opacified. Numerous filling defects within the lobar and segmental branches involving all pulmonary lobes. No main branch filling defect or saddle embolism. Heart size is normal. Leftward bowing of the interventricular septum and RV-LV ratio of just greater than 1.0 suggest right heart strain. No pericardial effusion. Scattered atherosclerotic calcifications of the aorta and coronary arteries. Mediastinum/Nodes: No enlarged mediastinal, hilar, or axillary lymph nodes. Thyroid Gland and trachea demonstrate no significant findings. Small hiatal hernia. Lungs/Pleura: No focal airspace consolidation. Mild bibasilar linear scarring/atelectasis. No pleural effusion or pneumothorax. Upper Abdomen: No acute abnormality. Musculoskeletal: No acute osseous findings. Bilateral breast prostheses. Review of the MIP images confirms the above findings. IMPRESSION: Extensive acute pulmonary emboli involving the lobar and segmental branches of all pulmonary lobes. No main branch or saddle embolism. There is CT evidence of right heart strain. These results were called by telephone at the time of interpretation on 01/28/2019 at 12:10 pm to Dr. Gareth Morgan , who verbally acknowledged these results. Electronically Signed   By: Davina Poke M.D.   On: 01/28/2019 12:11   Dg Chest Portable 1 View  Result Date: 01/28/2019 CLINICAL DATA:  Shortness of breath. EXAM: PORTABLE CHEST 1 VIEW COMPARISON:  Radiographs of December 26, 2018. FINDINGS: Stable cardiomediastinal silhouette. No pneumothorax or pleural effusion is noted. Both lungs are clear. The visualized skeletal structures are unremarkable. IMPRESSION: No active disease. Electronically  Signed   By: Marijo Conception M.D.   On: 01/28/2019 10:10    Scheduled Meds: . albuterol  2.5 mg Nebulization BID  . [START ON 01/30/2019] cephALEXin  250 mg Oral Daily  . cephALEXin  500 mg Oral Q12H  . cyclobenzaprine  10 mg Oral TID  . loratadine  10 mg Oral Daily  . LORazepam  0.5-1 mg Oral QHS  . mometasone-formoterol  2 puff Inhalation BID  . pantoprazole  40 mg Oral Daily  . potassium chloride  20 mEq Oral BID  . sodium chloride flush  3 mL Intravenous Q12H  . traZODone  100 mg Oral QHS   Continuous Infusions: . heparin 1,350 Units/hr (01/29/19 0918)     LOS: 1 day   Time spent: 35 minutes   Darliss Cheney, MD [facility] Pager [phone]  If 7PM-7AM, please contact night-coverage [email or web address] Password [facility] 01/29/2019, 12:29  PM

## 2019-01-30 LAB — BASIC METABOLIC PANEL
Anion gap: 11 (ref 5–15)
BUN: 15 mg/dL (ref 8–23)
CO2: 26 mmol/L (ref 22–32)
Calcium: 8.5 mg/dL — ABNORMAL LOW (ref 8.9–10.3)
Chloride: 102 mmol/L (ref 98–111)
Creatinine, Ser: 0.78 mg/dL (ref 0.44–1.00)
GFR calc Af Amer: >60 mL/min (ref >60)
GFR calc non Af Amer: >60 mL/min (ref >60)
Glucose, Bld: 100 mg/dL — ABNORMAL HIGH (ref 70–99)
Potassium: 4 mmol/L (ref 3.5–5.1)
Sodium: 139 mmol/L (ref 135–145)

## 2019-01-30 LAB — HEMOGLOBIN AND HEMATOCRIT, BLOOD
HCT: 36.2 % (ref 36.0–46.0)
HCT: 35.3 % — ABNORMAL LOW (ref 36.0–46.0)
HCT: 36.8 % (ref 36.0–46.0)
Hemoglobin: 11 g/dL — ABNORMAL LOW (ref 12.0–15.0)
Hemoglobin: 10.5 g/dL — ABNORMAL LOW (ref 12.0–15.0)
Hemoglobin: 10.9 g/dL — ABNORMAL LOW (ref 12.0–15.0)

## 2019-01-30 LAB — HEPARIN LEVEL (UNFRACTIONATED): Heparin Unfractionated: 0.56 IU/mL (ref 0.30–0.70)

## 2019-01-30 LAB — MAGNESIUM: Magnesium: 2 mg/dL (ref 1.7–2.4)

## 2019-01-30 NOTE — Progress Notes (Signed)
Pt LLE calf circumference was said to measure 34 cm at shift change. Leg does not look any bigger than at shift change. When measured during night shift, measured 39 cm. Believe that it was previously measured to end of tape (34 cm) but zero mark is beyond end of tape. To the zero mark, LLE calf measures 39 cm. Strong DP pulse in foot and no pain noted in calf by patient. Will continue to monitor. Lajoyce Corners, RN

## 2019-01-30 NOTE — Care Management Important Message (Signed)
Important Message  Patient Details  Name: CAMARA FLORER MRN: [id number] Date of Birth: 02-Nov-1940   Medicare Important Message Given:  Yes     Shelda Altes 01/30/2019, 11:40 AM

## 2019-01-30 NOTE — Progress Notes (Signed)
[place] for heparin Indication: pulmonary embolus  Allergies  Allergen Reactions  . Clarithromycin Swelling  . Penicillins Swelling    Tongue swelling Did it involve swelling of the face/tongue/throat, SOB, or low BP? Yes Did it involve sudden or severe rash/hives, skin peeling, or any reaction on the inside of your mouth or nose? Yes Did you need to seek medical attention at a hospital or doctor's office? Yes When did it last happen?childhood If all above answers are "NO", may proceed with cephalosporin use.   . Pneumococcal Vaccines Other (See Comments)    Couldn't breathe  . Sulfonamide Derivatives Swelling  . Estradiol Rash    Cream gives her a rash    Patient Measurements: Height: 5\' 6"  (167.6 cm) Weight: 174 lb 2.6 oz (79 kg) IBW/kg (Calculated) : 59.3 Heparin Dosing Weight: 75.6kg  Vital Signs: Temp: 99.5 F (37.5 C) (09/03 0813) Temp Source: Oral (09/03 0813) BP: 113/76 (09/03 0813) Pulse Rate: 107 (09/03 0813)  Labs: Recent Labs    01/28/19 1012 01/28/19 1641 01/28/19 2159  01/29/19 0716 01/29/19 1246 01/29/19 2034 01/30/19 0722  HGB 12.4 11.2* 11.4*   < > 11.3* 10.6* 11.0* 11.0*  HCT 41.0 36.5 37.3   < > 38.3 35.3* 36.9 36.2  PLT 482*  --   --   --  474*  --   --   --   HEPARINUNFRC  --   --  0.26*  --  0.44  --   --  0.56  CREATININE 0.87  --   --   --  0.84  --   --  0.78  TROPONINIHS 9 8  --   --   --   --   --   --    < > = values in this interval not displayed.    Estimated Creatinine Clearance: 64.5 mL/min (by C-G formula based on SCr of 0.78 mg/dL).   Assessment: 78 y.o. female with PE / recent DVT continues on IV heparin Heparin level therapeutic this AM  Goal of Therapy:  Heparin level 0.3-0.7 units/ml Monitor platelets by anticoagulation protocol: Yes   Plan:  Continue heparin at 1350 units / hr Follow-up am labs  Follow up plan to transition to [place]   Thank you Anette Guarneri,  PharmD [phone]  01/30/2019,8:46 AM

## 2019-01-30 NOTE — Plan of Care (Signed)
Nursing will continue to monitor.  

## 2019-01-30 NOTE — Progress Notes (Addendum)
  Progress Note    01/30/2019 7:17 AM * No surgery found *  Subjective:  No subjective change in LLE over past 24 hours   Vitals:   01/29/19 2332 01/30/19 0353  BP: 106/71 108/71  Pulse: 95 96  Resp: 16 13  Temp: 99 F (37.2 C) 98.9 F (37.2 C)  SpO2: 94% 93%   Physical Exam: Lungs: Non labored on [place] Extremities:  LLE edema to level of the thigh Abdomen:  Soft Neurologic: A&O  CBC    Component Value Date/Time   WBC 8.4 01/29/2019 0716   RBC 3.83 (L) 01/29/2019 0716   HGB 11.0 (L) 01/29/2019 2034   HCT 36.9 01/29/2019 2034   PLT 474 (H) 01/29/2019 0716   MCV 100.0 01/29/2019 0716   MCH 29.5 01/29/2019 0716   MCHC 29.5 (L) 01/29/2019 0716   RDW 15.7 (H) 01/29/2019 0716   LYMPHSABS 0.8 01/28/2019 1012   MONOABS 0.9 01/28/2019 1012   EOSABS 0.1 01/28/2019 1012   BASOSABS 0.0 01/28/2019 1012    BMET    Component Value Date/Time   NA 140 01/29/2019 0716   K 3.5 01/29/2019 0716   CL 102 01/29/2019 0716   CO2 27 01/29/2019 0716   GLUCOSE 108 (H) 01/29/2019 0716   BUN 16 01/29/2019 0716   CREATININE 0.84 01/29/2019 0716   CALCIUM 8.5 (L) 01/29/2019 0716   GFRNONAA >60 01/29/2019 0716   GFRAA >60 01/29/2019 0716    INR    Component Value Date/Time   INR 1.1 01/13/2019 1458     Intake/Output Summary (Last 24 hours) at 01/30/2019 0717 Last data filed at 01/30/2019 0157 Gross per 24 hour  Intake 662.57 ml  Output -  Net 662.57 ml     Assessment/Plan:  78 y.o. female with spontaneous retroperitoneal hematoma causing extensive DVT LLE; IVC filter placed 01/13/19.  Patient now with PE  H&H stable; continue IV heparin with plans to transition to DOAC Compression sock LLE when out of bed Ok to walk today   Dagoberto Ligas, PA-C [facility] [phone] 01/30/2019 7:17 AM  I have independently interviewed and examined patient and agree with PA assessment and plan above. Ok for transition to po anticoagulation. Will have her f/u in office  2-3 weeks for further evaluation and if her leg is still uncomfortable can consider thrombectomy with ivc filter removal as long as she is tolerating anticoagulation without difficulty.  Amoreena Neubert C. Donzetta Matters, MD [facility] [place] Office: [phone] Pager: [phone]

## 2019-01-30 NOTE — Progress Notes (Signed)
PROGRESS NOTE    [name]  [id number] DOB: 08-16-1940 DOA: 01/28/2019 PCP: Deland Pretty, MD   Brief Narrative:  [name] is a 78 y.o. female with medical history significant of left lower extremity deep venous thrombosis complicated by a retroperitoneal bleed therefore unable to get anticoagulation.  IVC filter was placed on 01/13/2019 and the patient was discharged home.  Was not anticoagulated because of the significant retroperitoneal bleeding.  Aspirin was recommended by vascular surgery.  she was due to follow-up with vascular surgery on Friday but presented back to ER on 01/28/2019 because of acute shortness of breath.  In the meantime she was seen by her primary care physician at [facility] who ordered a CTA of the chest last Thursday which was negative for pulmonary embolism.  Has been taking medication for pain in the left lower extremity but continues to have significant pain the leg feels very heavy, is difficult for her to ambulate.  She has been wearing her compression hose.  Shortness of breath has developed over the past 3 to 4 days and worse with exertion. had a brief episode of chest pain early on after the procedure but has not had any since.  No other respiratory complaints.  She was placed on 6 L of nasal oxygen upon presentation which was tapered down to 3 L while she was still in the ER.  CT scan of the chest was done and she was found to have extensive pulmonary emboli with right heart strain.  She was admitted by hospital service and after a very long discussion we are risks and benefits were discussed with the patient and patient's son about anticoagulation and they chose to be started on heparin and she was then admitted to hospital service with the plan to continue IV heparin and watch her hemoglobin closely for at least 4 to 5 days in the hospital.  Assessment & Plan:   Principal Problem:   Acute pulmonary embolism with acute cor pulmonale (HCC) Active Problems:  Acute respiratory failure with hypoxia (HCC)   Retroperitoneal bleed   DVT, recurrent, lower extremity, chronic, left (HCC)   Hypokalemia   Left lower extremity DVT/massive bilateral PE with right heart strain/cor pulmonale: Normal ejection fraction.  Also normal right ventricle.  Vascular surgery has seen the patient as well.  Hemoglobin is stable.  Continue heparin drip.Marland Kitchen  Acute hypoxic respiratory failure: Secondary to PE.  Still on 3 L of oxygen.  Encourage to sit and walk more.  Encouraged incentive spirometry.  Try to wean oxygen.  Recent retroperitoneal bleeding: Hemoglobin is stable.  Continue H&H every 8 hours for 1 more day.  Hypokalemia: Resolved.  DVT prophylaxis: Heparin drip Code Status: Full code Family Communication:  None present at bedside.  Plan of care discussed with patient in length and he verbalized understanding and agreed with it. Disposition Plan: Will stay in the hospital for next few days.  Consultants:   Vascular surgery  Procedures:   None  Antimicrobials:   None   Subjective: Patient seen and examined.  Feels better than yesterday.  Leg pain is improved.  Breathing is improved as well.  No chest pain.  Objective: Vitals:   01/30/19 0743 01/30/19 0745 01/30/19 0813 01/30/19 1233  BP:   113/76 111/82  Pulse:   (!) 107 84  Resp:   (!) 24   Temp:   99.5 F (37.5 C) 98.6 F (37 C)  TempSrc:   Oral Axillary  SpO2: 95% 95%  95%  Weight:      Height:        Intake/Output Summary (Last 24 hours) at 01/30/2019 1244 Last data filed at 01/30/2019 0157 Gross per 24 hour  Intake 417.15 ml  Output -  Net 417.15 ml   Filed Weights   01/28/19 1300  Weight: 79 kg    Examination:  General exam: Appears calm and comfortable  Respiratory system: Clear to auscultation. Respiratory effort normal. Cardiovascular system: S1 & S2 heard, RRR. No JVD, murmurs, rubs, gallops or clicks.  Left lower extremity edema Gastrointestinal system: Abdomen is  nondistended, soft and nontender. No organomegaly or masses felt. Normal bowel sounds heard. Central nervous system: Alert and oriented. No focal neurological deficits. Extremities: Symmetric 5 x 5 power. Skin: No rashes, lesions or ulcers Psychiatry: Judgement and insight appear normal. Mood & affect appropriate.    Data Reviewed: I have personally reviewed following labs and imaging studies  CBC: Recent Labs  Lab 01/28/19 1012  01/29/19 0230 01/29/19 0716 01/29/19 1246 01/29/19 2034 01/30/19 0722  WBC 11.7*  --   --  8.4  --   --   --   NEUTROABS 9.8*  --   --   --   --   --   --   HGB 12.4   < > 10.6* 11.3* 10.6* 11.0* 11.0*  HCT 41.0   < > 36.1 38.3 35.3* 36.9 36.2  MCV 98.3  --   --  100.0  --   --   --   PLT 482*  --   --  474*  --   --   --    < > = values in this interval not displayed.   Basic Metabolic Panel: Recent Labs  Lab 01/28/19 1012 01/29/19 0716 01/30/19 0722  NA 141 140 139  K 3.4* 3.5 4.0  CL 104 102 102  CO2 24 27 26   GLUCOSE 110* 108* 100*  BUN 11 16 15   CREATININE 0.87 0.84 0.78  CALCIUM 8.7* 8.5* 8.5*  MG  --   --  2.0   GFR: Estimated Creatinine Clearance: 64.5 mL/min (by C-G formula based on SCr of 0.78 mg/dL). Liver Function Tests: Recent Labs  Lab 01/28/19 1012  AST 29  ALT 19  ALKPHOS 88  BILITOT 1.5*  PROT 6.7  ALBUMIN 3.5   No results for input(s): LIPASE, AMYLASE in the last 168 hours. No results for input(s): AMMONIA in the last 168 hours. Coagulation Profile: No results for input(s): INR, PROTIME in the last 168 hours. Cardiac Enzymes: No results for input(s): CKTOTAL, CKMB, CKMBINDEX, TROPONINI in the last 168 hours. BNP (last 3 results) No results for input(s): PROBNP in the last 8760 hours. HbA1C: No results for input(s): HGBA1C in the last 72 hours. CBG: No results for input(s): GLUCAP in the last 168 hours. Lipid Profile: No results for input(s): CHOL, HDL, LDLCALC, TRIG, CHOLHDL, LDLDIRECT in the last 72  hours. Thyroid Function Tests: No results for input(s): TSH, T4TOTAL, FREET4, T3FREE, THYROIDAB in the last 72 hours. Anemia Panel: No results for input(s): VITAMINB12, FOLATE, FERRITIN, TIBC, IRON, RETICCTPCT in the last 72 hours. Sepsis Labs: No results for input(s): PROCALCITON, LATICACIDVEN in the last 168 hours.  Recent Results (from the past 240 hour(s))  SARS CORONAVIRUS 2 (TAT 6-24 HRS) Nasopharyngeal Nasopharyngeal Swab     Status: None   Collection Time: 01/28/19  3:01 PM   Specimen: Nasopharyngeal Swab  Result Value Ref Range Status   SARS Coronavirus 2  NEGATIVE NEGATIVE Final    Comment: (NOTE) SARS-CoV-2 target nucleic acids are NOT DETECTED. The SARS-CoV-2 RNA is generally detectable in upper and lower respiratory specimens during the acute phase of infection. Negative results do not preclude SARS-CoV-2 infection, do not rule out co-infections with other pathogens, and should not be used as the sole basis for treatment or other patient management decisions. Negative results must be combined with clinical observations, patient history, and epidemiological information. The expected result is Negative. Fact Sheet for Patients: SugarRoll.be Fact Sheet for Healthcare Providers: https://www.woods-mathews.com/ This test is not yet approved or cleared by the [place] FDA and  has been authorized for detection and/or diagnosis of SARS-CoV-2 by FDA under an Emergency Use Authorization (EUA). This EUA will remain  in effect (meaning this test can be used) for the duration of the COVID-19 declaration under Section 56 4(b)(1) of the Act, 21 U.S.C. section 360bbb-3(b)(1), unless the authorization is terminated or revoked sooner. Performed at [facility], [place] [address], [place], [place] [address]       Radiology Studies: No results found.  Scheduled Meds: . albuterol  2.5 mg Nebulization BID  . cephALEXin  250 mg Oral  Daily  . loratadine  10 mg Oral Daily  . LORazepam  0.5-1 mg Oral QHS  . mometasone-formoterol  2 puff Inhalation BID  . pantoprazole  40 mg Oral Daily  . sodium chloride flush  3 mL Intravenous Q12H  . traZODone  100 mg Oral QHS   Continuous Infusions: . heparin 1,350 Units/hr (01/30/19 0419)     LOS: 2 days   Time spent: 30 minutes   Darliss Cheney, MD [facility] Pager [phone]  If 7PM-7AM, please contact night-coverage [email or web address] Password [facility] 01/30/2019, 12:44 PM

## 2019-01-31 ENCOUNTER — Encounter: Payer: PPO | Admitting: Vascular Surgery

## 2019-01-31 LAB — HEMOGLOBIN AND HEMATOCRIT, BLOOD
HCT: 38.6 % (ref 36.0–46.0)
HCT: 36.5 % (ref 36.0–46.0)
Hemoglobin: 11.4 g/dL — ABNORMAL LOW (ref 12.0–15.0)
Hemoglobin: 11.3 g/dL — ABNORMAL LOW (ref 12.0–15.0)

## 2019-01-31 LAB — BASIC METABOLIC PANEL
Anion gap: 9 (ref 5–15)
BUN: 14 mg/dL (ref 8–23)
CO2: 24 mmol/L (ref 22–32)
Calcium: 8.8 mg/dL — ABNORMAL LOW (ref 8.9–10.3)
Chloride: 105 mmol/L (ref 98–111)
Creatinine, Ser: 0.84 mg/dL (ref 0.44–1.00)
GFR calc Af Amer: >60 mL/min (ref >60)
GFR calc non Af Amer: >60 mL/min (ref >60)
Glucose, Bld: 97 mg/dL (ref 70–99)
Potassium: 4.5 mmol/L (ref 3.5–5.1)
Sodium: 138 mmol/L (ref 135–145)

## 2019-01-31 LAB — HEPARIN LEVEL (UNFRACTIONATED): Heparin Unfractionated: 0.6 IU/mL (ref 0.30–0.70)

## 2019-01-31 MED ORDER — ALBUTEROL SULFATE (2.5 MG/3ML) 0.083% IN NEBU: 2.5000 mg | INHALATION_SOLUTION | Freq: Four times a day (QID) | RESPIRATORY_TRACT | Status: DC | PRN

## 2019-01-31 MED ORDER — ELIQUIS 5 MG VTE STARTER PACK: ORAL_TABLET | ORAL | 0 refills | Status: DC

## 2019-01-31 MED ORDER — APIXABAN 5 MG PO TABS
10.0000 mg | ORAL_TABLET | Freq: Two times a day (BID) | ORAL | Status: DC
  Administered 2019-01-31 – 2019-02-02 (×5): 10 mg via ORAL
  Filled 2019-01-31 (×5): qty 2

## 2019-01-31 MED ORDER — METHYLPREDNISOLONE SODIUM SUCC 125 MG IJ SOLR
60.0000 mg | Freq: Two times a day (BID) | INTRAMUSCULAR | Status: DC
  Administered 2019-01-31 – 2019-02-02 (×5): 60 mg via INTRAVENOUS
  Filled 2019-01-31 (×5): qty 2

## 2019-01-31 MED FILL — ELIQUIS STARTER PACK 5 MG T: 5 | 30 days supply | Qty: 74 | Fill #0

## 2019-01-31 NOTE — Progress Notes (Signed)
PROGRESS NOTE    [name]  [id number] DOB: March 09, 1941 DOA: 01/28/2019 PCP: Deland Pretty, MD   Brief Narrative:  [name] is a 78 y.o. female with medical history significant of left lower extremity deep venous thrombosis complicated by a retroperitoneal bleed therefore unable to get anticoagulation.  IVC filter was placed on 01/13/2019 and the patient was discharged home.  Was not anticoagulated because of the significant retroperitoneal bleeding.  Aspirin was recommended by vascular surgery.  she was due to follow-up with vascular surgery on Friday but presented back to ER on 01/28/2019 because of acute shortness of breath.  In the meantime she was seen by her primary care physician at [facility] who ordered a CTA of the chest last Thursday which was negative for pulmonary embolism.  Has been taking medication for pain in the left lower extremity but continues to have significant pain the leg feels very heavy, is difficult for her to ambulate.  She has been wearing her compression hose.  Shortness of breath has developed over the past 3 to 4 days and worse with exertion. had a brief episode of chest pain early on after the procedure but has not had any since.  No other respiratory complaints.  She was placed on 6 L of nasal oxygen upon presentation which was tapered down to 3 L while she was still in the ER.  CT scan of the chest was done and she was found to have extensive pulmonary emboli with right heart strain.  She was admitted by hospital service and after a very long discussion we are risks and benefits were discussed with the patient and patient's son about anticoagulation and they chose to be started on heparin and she was then admitted to hospital service with the plan to continue IV heparin and watch her hemoglobin closely for at least 4 to 5 days in the hospital.  Assessment & Plan:   Principal Problem:   Acute pulmonary embolism with acute cor pulmonale (HCC) Active Problems:  Acute respiratory failure with hypoxia (HCC)   Retroperitoneal bleed   DVT, recurrent, lower extremity, chronic, left (HCC)   Hypokalemia   Left lower extremity DVT/massive bilateral PE with right heart strain/cor pulmonale: Normal ejection fraction.  Also normal right ventricle.  Vascular surgery has seen the patient as well and they have cleared the patient to be started on oral anticoagulation.  Hemoglobin is stable.  We will switch her on Eliquis and keep her overnight for 24-hour monitoring with every 12 H&H.  Acute hypoxic respiratory failure: Secondary to PE.  Still on 3 L of oxygen.  Encouraged to sit and walk more.  Incentive is primarily was ordered yesterday but was not available in patient's room.  I have put another communication order for the nurses to provide incentive spirometry for the patient so we can try to wean her oxygen.  Possible mild COPD exacerbation: Patient is wheezy on exam with diminished breath sounds.  She has a history of COPD.  She is ex-smoker.  We will start her on Solu-Medrol which will help weaning oxygen as well.  Recent retroperitoneal bleeding: Hemoglobin is stable.  Continue H&H every 12 hours for another day.  Hypokalemia: Resolved.  DVT prophylaxis: Eliquis. Code Status: Full code Family Communication:  None present at bedside.  Plan of care discussed with patient in length and he verbalized understanding and agreed with it. Disposition Plan: Potential discharge tomorrow if hemoglobin stays stable.  Consultants:   Vascular surgery  Procedures:  None  Antimicrobials:   None   Subjective: Patient seen and examined.  Denied any shortness of breath.  Feels better.  No other complaint. Objective: Vitals:   01/30/19 2309 01/31/19 0328 01/31/19 0826 01/31/19 0829  BP: 117/77 125/80  128/77  Pulse: (!) 102 92  (!) 106  Resp: 17 16  14   Temp: 99.1 F (37.3 C) 98.8 F (37.1 C)  98 F (36.7 C)  TempSrc: Oral Oral  Oral  SpO2: 93% 96%  93%   Weight:      Height:        Intake/Output Summary (Last 24 hours) at 01/31/2019 1133 Last data filed at 01/31/2019 0900 Gross per 24 hour  Intake 253 ml  Output -  Net 253 ml   Filed Weights   01/28/19 1300  Weight: 79 kg    Examination:  General exam: Appears calm and comfortable  Respiratory system: Diminished breath sounds with end expiratory wheezes bilaterally. Respiratory effort normal. Cardiovascular system: S1 & S2 heard, RRR. No JVD, murmurs, rubs, gallops or clicks.  Left lower extremity swelling. Gastrointestinal system: Abdomen is nondistended, soft and nontender. No organomegaly or masses felt. Normal bowel sounds heard. Central nervous system: Alert and oriented. No focal neurological deficits. Extremities: Symmetric 5 x 5 power. Skin: No rashes, lesions or ulcers.  Psychiatry: Judgement and insight appear normal. Mood & affect appropriate.   Data Reviewed: I have personally reviewed following labs and imaging studies  CBC: Recent Labs  Lab 01/28/19 1012  01/29/19 0716  01/29/19 2034 01/30/19 0722 01/30/19 1218 01/30/19 2120 01/31/19 0555  WBC 11.7*  --  8.4  --   --   --   --   --   --   NEUTROABS 9.8*  --   --   --   --   --   --   --   --   HGB 12.4   < > 11.3*   < > 11.0* 11.0* 10.5* 10.9* 11.4*  HCT 41.0   < > 38.3   < > 36.9 36.2 35.3* 36.8 38.6  MCV 98.3  --  100.0  --   --   --   --   --   --   PLT 482*  --  474*  --   --   --   --   --   --    < > = values in this interval not displayed.   Basic Metabolic Panel: Recent Labs  Lab 01/28/19 1012 01/29/19 0716 01/30/19 0722 01/31/19 0555  NA 141 140 139 138  K 3.4* 3.5 4.0 4.5  CL 104 102 102 105  CO2 24 27 26 24   GLUCOSE 110* 108* 100* 97  BUN 11 16 15 14   CREATININE 0.87 0.84 0.78 0.84  CALCIUM 8.7* 8.5* 8.5* 8.8*  MG  --   --  2.0  --    GFR: Estimated Creatinine Clearance: 61.4 mL/min (by C-G formula based on SCr of 0.84 mg/dL). Liver Function Tests: Recent Labs  Lab  01/28/19 1012  AST 29  ALT 19  ALKPHOS 88  BILITOT 1.5*  PROT 6.7  ALBUMIN 3.5   No results for input(s): LIPASE, AMYLASE in the last 168 hours. No results for input(s): AMMONIA in the last 168 hours. Coagulation Profile: No results for input(s): INR, PROTIME in the last 168 hours. Cardiac Enzymes: No results for input(s): CKTOTAL, CKMB, CKMBINDEX, TROPONINI in the last 168 hours. BNP (last 3 results) No results for input(s): PROBNP  in the last 8760 hours. HbA1C: No results for input(s): HGBA1C in the last 72 hours. CBG: No results for input(s): GLUCAP in the last 168 hours. Lipid Profile: No results for input(s): CHOL, HDL, LDLCALC, TRIG, CHOLHDL, LDLDIRECT in the last 72 hours. Thyroid Function Tests: No results for input(s): TSH, T4TOTAL, FREET4, T3FREE, THYROIDAB in the last 72 hours. Anemia Panel: No results for input(s): VITAMINB12, FOLATE, FERRITIN, TIBC, IRON, RETICCTPCT in the last 72 hours. Sepsis Labs: No results for input(s): PROCALCITON, LATICACIDVEN in the last 168 hours.  Recent Results (from the past 240 hour(s))  SARS CORONAVIRUS 2 (TAT 6-24 HRS) Nasopharyngeal Nasopharyngeal Swab     Status: None   Collection Time: 01/28/19  3:01 PM   Specimen: Nasopharyngeal Swab  Result Value Ref Range Status   SARS Coronavirus 2 NEGATIVE NEGATIVE Final    Comment: (NOTE) SARS-CoV-2 target nucleic acids are NOT DETECTED. The SARS-CoV-2 RNA is generally detectable in upper and lower respiratory specimens during the acute phase of infection. Negative results do not preclude SARS-CoV-2 infection, do not rule out co-infections with other pathogens, and should not be used as the sole basis for treatment or other patient management decisions. Negative results must be combined with clinical observations, patient history, and epidemiological information. The expected result is Negative. Fact Sheet for Patients: SugarRoll.be Fact Sheet for  Healthcare Providers: https://www.woods-mathews.com/ This test is not yet approved or cleared by the [place] FDA and  has been authorized for detection and/or diagnosis of SARS-CoV-2 by FDA under an Emergency Use Authorization (EUA). This EUA will remain  in effect (meaning this test can be used) for the duration of the COVID-19 declaration under Section 56 4(b)(1) of the Act, 21 U.S.C. section 360bbb-3(b)(1), unless the authorization is terminated or revoked sooner. Performed at [facility], [place] [address] [place], [place] [address]       Radiology Studies: No results found.  Scheduled Meds: . cephALEXin  250 mg Oral Daily  . loratadine  10 mg Oral Daily  . LORazepam  0.5-1 mg Oral QHS  . mometasone-formoterol  2 puff Inhalation BID  . pantoprazole  40 mg Oral Daily  . sodium chloride flush  3 mL Intravenous Q12H  . traZODone  100 mg Oral QHS   Continuous Infusions:    LOS: 3 days   Time spent: 31 minutes   Darliss Cheney, MD [facility] Pager [phone]  If 7PM-7AM, please contact night-coverage [email or web address] Password [facility] 01/31/2019, 11:33 AM

## 2019-01-31 NOTE — Progress Notes (Signed)
   H&H is stable on heparin drip.  Okay to transition to oral anticoagulation.  I will have her follow-up in short interval to ensure that she is tolerating her anticoagulation prior to considering intervention of her left lower extremity as well as removal of the filter.  Swelling does appear to be somewhat improving but may require thrombectomy and stenting for long-term resolution.  I discussed this plan with her and she understands that would need to be tolerating her anticoagulation prior to any of these interventions to have the best outcome.  I will have her follow-up in 2 to 3 weeks.  She is okay for discharge from vascular standpoint when she is tolerating oral anticoagulation.  Brandon C. Donzetta Matters, MD [facility] Office: [phone] Pager: [phone]

## 2019-01-31 NOTE — Progress Notes (Addendum)
[place] for heparin to Eliquis Indication: pulmonary embolus  Allergies  Allergen Reactions  . Clarithromycin Swelling  . Penicillins Swelling    Tongue swelling Did it involve swelling of the face/tongue/throat, SOB, or low BP? Yes Did it involve sudden or severe rash/hives, skin peeling, or any reaction on the inside of your mouth or nose? Yes Did you need to seek medical attention at a hospital or doctor's office? Yes When did it last happen?childhood If all above answers are "NO", may proceed with cephalosporin use.   . Pneumococcal Vaccines Other (See Comments)    Couldn't breathe  . Sulfonamide Derivatives Swelling  . Estradiol Rash    Cream gives her a rash    Patient Measurements: Height: 5' 6"  (167.6 cm) Weight: 174 lb 2.6 oz (79 kg) IBW/kg (Calculated) : 59.3 Heparin Dosing Weight: 75.6kg  Vital Signs: Temp: 98 F (36.7 C) (09/04 0829) Temp Source: Oral (09/04 0829) BP: 128/77 (09/04 0829) Pulse Rate: 106 (09/04 0829)  Labs: Recent Labs    01/28/19 1012 01/28/19 1641  01/29/19 0716  01/30/19 0722 01/30/19 1218 01/30/19 2120 01/31/19 0555  HGB 12.4 11.2*   < > 11.3*   < > 11.0* 10.5* 10.9* 11.4*  HCT 41.0 36.5   < > 38.3   < > 36.2 35.3* 36.8 38.6  PLT 482*  --   --  474*  --   --   --   --   --   HEPARINUNFRC  --   --    < > 0.44  --  0.56  --   --  0.60  CREATININE 0.87  --   --  0.84  --  0.78  --   --  0.84  TROPONINIHS 9 8  --   --   --   --   --   --   --    < > = values in this interval not displayed.    Estimated Creatinine Clearance: 61.4 mL/min (by C-G formula based on SCr of 0.84 mg/dL).   Assessment: 78 y.o. female with PE / recent DVT continues on IV heparin Heparin level therapeutic this AM  Transitioning to Eliquis   Goal of Therapy:  Heparin level 0.3-0.7 units/ml Monitor platelets by anticoagulation protocol: Yes   Plan:  DC heparin and heparin labs Eliquis 10 mg po BID x 7  days  Eliquis discharge kit to [facility] in preparation for discharge  Thank you Anette Guarneri, PharmD [phone]  01/31/2019,8:56 AM

## 2019-01-31 NOTE — Discharge Instructions (Signed)
Information on my medicine - ELIQUIS (apixaban)  This medication education was reviewed with me or my healthcare representative as part of my discharge preparation.  The pharmacist that spoke with me during my hospital stay was:  Dysen Edmondson Kay, RPH  Why was Eliquis prescribed for you? Eliquis was prescribed to treat blood clots that may have been found in the veins of your legs (deep vein thrombosis) or in your lungs (pulmonary embolism) and to reduce the risk of them occurring again.  What do You need to know about Eliquis ? The starting dose is 10 mg (two 5 mg tablets) taken TWICE daily for the FIRST SEVEN (7) DAYS, then  the dose is reduced to ONE 5 mg tablet taken TWICE daily.  Eliquis may be taken with or without food.   Try to take the dose about the same time in the morning and in the evening. If you have difficulty swallowing the tablet whole please discuss with your pharmacist how to take the medication safely.  Take Eliquis exactly as prescribed and DO NOT stop taking Eliquis without talking to the doctor who prescribed the medication.  Stopping may increase your risk of developing a new blood clot.  Refill your prescription before you run out.  After discharge, you should have regular check-up appointments with your healthcare provider that is prescribing your Eliquis.    What do you do if you miss a dose? If a dose of ELIQUIS is not taken at the scheduled time, take it as soon as possible on the same day and twice-daily administration should be resumed. The dose should not be doubled to make up for a missed dose.  Important Safety Information A possible side effect of Eliquis is bleeding. You should call your healthcare provider right away if you experience any of the following: Bleeding from an injury or your nose that does not stop. Unusual colored urine (red or dark brown) or unusual colored stools (red or black). Unusual bruising for unknown reasons. A serious  fall or if you hit your head (even if there is no bleeding).  Some medicines may interact with Eliquis and might increase your risk of bleeding or clotting while on Eliquis. To help avoid this, consult your healthcare provider or pharmacist prior to using any new prescription or non-prescription medications, including herbals, vitamins, non-steroidal anti-inflammatory drugs (NSAIDs) and supplements.  This website has more information on Eliquis (apixaban): http://www.eliquis.com/eliquis/home  

## 2019-02-01 LAB — CBC WITH DIFFERENTIAL/PLATELET
Abs Immature Granulocytes: 0.06 10*3/uL (ref 0.00–0.07)
Basophils Absolute: 0 10*3/uL (ref 0.0–0.1)
Basophils Relative: 0 %
Eosinophils Absolute: 0 10*3/uL (ref 0.0–0.5)
Eosinophils Relative: 0 %
HCT: 38 % (ref 36.0–46.0)
Hemoglobin: 11.5 g/dL — ABNORMAL LOW (ref 12.0–15.0)
Immature Granulocytes: 1 %
Lymphocytes Relative: 5 %
Lymphs Abs: 0.4 10*3/uL — ABNORMAL LOW (ref 0.7–4.0)
MCH: 29.9 pg (ref 26.0–34.0)
MCHC: 30.3 g/dL (ref 30.0–36.0)
MCV: 98.7 fL (ref 80.0–100.0)
Monocytes Absolute: 0.3 10*3/uL (ref 0.1–1.0)
Monocytes Relative: 3 %
Neutro Abs: 7.5 10*3/uL (ref 1.7–7.7)
Neutrophils Relative %: 91 %
Platelets: 498 10*3/uL — ABNORMAL HIGH (ref 150–400)
RBC: 3.85 MIL/uL — ABNORMAL LOW (ref 3.87–5.11)
RDW: 14.9 % (ref 11.5–15.5)
WBC: 8.3 10*3/uL (ref 4.0–10.5)
nRBC: 0 % (ref 0.0–0.2)

## 2019-02-01 LAB — BASIC METABOLIC PANEL
Anion gap: 9 (ref 5–15)
BUN: 15 mg/dL (ref 8–23)
CO2: 26 mmol/L (ref 22–32)
Calcium: 8.8 mg/dL — ABNORMAL LOW (ref 8.9–10.3)
Chloride: 103 mmol/L (ref 98–111)
Creatinine, Ser: 0.78 mg/dL (ref 0.44–1.00)
GFR calc Af Amer: >60 mL/min (ref >60)
GFR calc non Af Amer: >60 mL/min (ref >60)
Glucose, Bld: 194 mg/dL — ABNORMAL HIGH (ref 70–99)
Potassium: 4.4 mmol/L (ref 3.5–5.1)
Sodium: 138 mmol/L (ref 135–145)

## 2019-02-01 NOTE — Progress Notes (Signed)
PROGRESS NOTE    [name]  [id number] DOB: 03/17/1941 DOA: 01/28/2019 PCP: Deland Pretty, MD   Brief Narrative:  [name] is a 78 y.o. female with medical history significant of left lower extremity deep venous thrombosis complicated by a retroperitoneal bleed therefore unable to get anticoagulation.  IVC filter was placed on 01/13/2019 and the patient was discharged home.  Was not anticoagulated because of the significant retroperitoneal bleeding.  Aspirin was recommended by vascular surgery.  she was due to follow-up with vascular surgery on Friday but presented back to ER on 01/28/2019 because of acute shortness of breath.  In the meantime she was seen by her primary care physician at [facility] who ordered a CTA of the chest last Thursday which was negative for pulmonary embolism.  Has been taking medication for pain in the left lower extremity but continues to have significant pain the leg feels very heavy, is difficult for her to ambulate.  She has been wearing her compression hose.  Shortness of breath has developed over the past 3 to 4 days and worse with exertion. had a brief episode of chest pain early on after the procedure but has not had any since.  No other respiratory complaints.  She was placed on 6 L of nasal oxygen upon presentation which was tapered down to 3 L while she was still in the ER.  CT scan of the chest was done and she was found to have extensive pulmonary emboli with right heart strain.  She was admitted by hospital service and after a very long discussion we are risks and benefits were discussed with the patient and patient's son about anticoagulation and they chose to be started on heparin and she was then admitted to hospital service with the plan to continue IV heparin and watch her hemoglobin closely for at least 4 to 5 days in the hospital.  Patient's heparin was eventually switched to Eliquis on 01/31/2019 and she was also having mild COPD exacerbation that day so  she was started on Solu-Medrol.  Assessment & Plan:   Principal Problem:   Acute pulmonary embolism with acute cor pulmonale (HCC) Active Problems:   Acute respiratory failure with hypoxia (HCC)   Retroperitoneal bleed   DVT, recurrent, lower extremity, chronic, left (HCC)   Hypokalemia   Left lower extremity DVT/massive bilateral PE with right heart strain/cor pulmonale: Normal ejection fraction.  Also normal right ventricle.  Vascular surgery has seen the patient as well and they have cleared the patient to be started on oral anticoagulation.  Continue Eliquis.  CBC this morning pending.  Repeat CBC in the morning.  Acute hypoxic respiratory failure: Secondary to PE.  Down to only 1 L of oxygen.  Encouraged to sit and walk more.  Continue incentive spirometry.  Possible mild COPD exacerbation: Still wheezy but better than yesterday.  Continue IV Solu-Medrol and DuoNeb.  Recent retroperitoneal bleeding: CBC this morning pending.  Repeat CBC in the morning as well.  Hypokalemia: Resolved.  DVT prophylaxis: Eliquis. Code Status: Full code Family Communication:  None present at bedside.  Plan of care discussed with patient in length and he verbalized understanding and agreed with it.  Also spoke to patient's son over the phone when I saw patient this morning. Disposition Plan: Potential discharge tomorrow if hemoglobin stays stable.  Consultants:   Vascular surgery  Procedures:   None  Antimicrobials:   None   Subjective: Patient seen and examined.  Feels much better.  Only  minimal exertional dyspnea.   Objective: Vitals:   01/31/19 2352 02/01/19 0352 02/01/19 0730 02/01/19 0845  BP: 106/78 122/85 120/81   Pulse: 86 77 94   Resp: 17 17 19    Temp: 98.3 F (36.8 C) 97.7 F (36.5 C) 98.2 F (36.8 C)   TempSrc: Oral Oral Oral   SpO2: 91% 91% 92% 92%  Weight:      Height:        Intake/Output Summary (Last 24 hours) at 02/01/2019 1023 Last data filed at 02/01/2019  0800 Gross per 24 hour  Intake 240 ml  Output --  Net 240 ml   Filed Weights   01/28/19 1300  Weight: 79 kg    Examination:  General exam: Appears calm and comfortable  Respiratory system: Diminished breath sounds with expiratory wheezes bilaterally, slightly improved compared to yesterday. Respiratory effort normal. Cardiovascular system: S1 & S2 heard, RRR. No JVD, murmurs, rubs, gallops or clicks.  Right lower extremity edema.. Gastrointestinal system: Abdomen is nondistended, soft and nontender. No organomegaly or masses felt. Normal bowel sounds heard. Central nervous system: Alert and oriented. No focal neurological deficits. Extremities: Symmetric 5 x 5 power. Skin: No rashes, lesions or ulcers.  Psychiatry: Judgement and insight appear normal. Mood & affect appropriate.    Data Reviewed: I have personally reviewed following labs and imaging studies  CBC: Recent Labs  Lab 01/28/19 1012  01/29/19 0716  01/30/19 0722 01/30/19 1218 01/30/19 2120 01/31/19 0555 01/31/19 1947  WBC 11.7*  --  8.4  --   --   --   --   --   --   NEUTROABS 9.8*  --   --   --   --   --   --   --   --   HGB 12.4   < > 11.3*   < > 11.0* 10.5* 10.9* 11.4* 11.3*  HCT 41.0   < > 38.3   < > 36.2 35.3* 36.8 38.6 36.5  MCV 98.3  --  100.0  --   --   --   --   --   --   PLT 482*  --  474*  --   --   --   --   --   --    < > = values in this interval not displayed.   Basic Metabolic Panel: Recent Labs  Lab 01/28/19 1012 01/29/19 0716 01/30/19 0722 01/31/19 0555 02/01/19 0249  NA 141 140 139 138 138  K 3.4* 3.5 4.0 4.5 4.4  CL 104 102 102 105 103  CO2 24 27 26 24 26   GLUCOSE 110* 108* 100* 97 194*  BUN 11 16 15 14 15   CREATININE 0.87 0.84 0.78 0.84 0.78  CALCIUM 8.7* 8.5* 8.5* 8.8* 8.8*  MG  --   --  2.0  --   --    GFR: Estimated Creatinine Clearance: 64.5 mL/min (by C-G formula based on SCr of 0.78 mg/dL). Liver Function Tests: Recent Labs  Lab 01/28/19 1012  AST 29  ALT 19   ALKPHOS 88  BILITOT 1.5*  PROT 6.7  ALBUMIN 3.5   No results for input(s): LIPASE, AMYLASE in the last 168 hours. No results for input(s): AMMONIA in the last 168 hours. Coagulation Profile: No results for input(s): INR, PROTIME in the last 168 hours. Cardiac Enzymes: No results for input(s): CKTOTAL, CKMB, CKMBINDEX, TROPONINI in the last 168 hours. BNP (last 3 results) No results for input(s): PROBNP in the last 8760 hours.  HbA1C: No results for input(s): HGBA1C in the last 72 hours. CBG: No results for input(s): GLUCAP in the last 168 hours. Lipid Profile: No results for input(s): CHOL, HDL, LDLCALC, TRIG, CHOLHDL, LDLDIRECT in the last 72 hours. Thyroid Function Tests: No results for input(s): TSH, T4TOTAL, FREET4, T3FREE, THYROIDAB in the last 72 hours. Anemia Panel: No results for input(s): VITAMINB12, FOLATE, FERRITIN, TIBC, IRON, RETICCTPCT in the last 72 hours. Sepsis Labs: No results for input(s): PROCALCITON, LATICACIDVEN in the last 168 hours.  Recent Results (from the past 240 hour(s))  SARS CORONAVIRUS 2 (TAT 6-24 HRS) Nasopharyngeal Nasopharyngeal Swab     Status: None   Collection Time: 01/28/19  3:01 PM   Specimen: Nasopharyngeal Swab  Result Value Ref Range Status   SARS Coronavirus 2 NEGATIVE NEGATIVE Final    Comment: (NOTE) SARS-CoV-2 target nucleic acids are NOT DETECTED. The SARS-CoV-2 RNA is generally detectable in upper and lower respiratory specimens during the acute phase of infection. Negative results do not preclude SARS-CoV-2 infection, do not rule out co-infections with other pathogens, and should not be used as the sole basis for treatment or other patient management decisions. Negative results must be combined with clinical observations, patient history, and epidemiological information. The expected result is Negative. Fact Sheet for Patients: SugarRoll.be Fact Sheet for Healthcare  Providers: https://www.woods-mathews.com/ This test is not yet approved or cleared by the [place] FDA and  has been authorized for detection and/or diagnosis of SARS-CoV-2 by FDA under an Emergency Use Authorization (EUA). This EUA will remain  in effect (meaning this test can be used) for the duration of the COVID-19 declaration under Section 56 4(b)(1) of the Act, 21 U.S.C. section 360bbb-3(b)(1), unless the authorization is terminated or revoked sooner. Performed at [facility], [place] [address], [place], [place] [address]       Radiology Studies: No results found.  Scheduled Meds:  apixaban  10 mg Oral BID   cephALEXin  250 mg Oral Daily   loratadine  10 mg Oral Daily   LORazepam  0.5-1 mg Oral QHS   methylPREDNISolone (SOLU-MEDROL) injection  60 mg Intravenous Q12H   mometasone-formoterol  2 puff Inhalation BID   pantoprazole  40 mg Oral Daily   sodium chloride flush  3 mL Intravenous Q12H   traZODone  100 mg Oral QHS   Continuous Infusions:    LOS: 4 days   Time spent: 29 minutes   Darliss Cheney, MD [facility] Pager [phone]  If 7PM-7AM, please contact night-coverage [email or web address] Password [email or web address] 02/01/2019, 10:23 AM

## 2019-02-02 LAB — CBC WITH DIFFERENTIAL/PLATELET
Abs Immature Granulocytes: 0.06 10*3/uL (ref 0.00–0.07)
Basophils Absolute: 0 10*3/uL (ref 0.0–0.1)
Basophils Relative: 0 %
Eosinophils Absolute: 0 10*3/uL (ref 0.0–0.5)
Eosinophils Relative: 0 %
HCT: 33.9 % — ABNORMAL LOW (ref 36.0–46.0)
Hemoglobin: 10.3 g/dL — ABNORMAL LOW (ref 12.0–15.0)
Immature Granulocytes: 1 %
Lymphocytes Relative: 4 %
Lymphs Abs: 0.4 10*3/uL — ABNORMAL LOW (ref 0.7–4.0)
MCH: 29.6 pg (ref 26.0–34.0)
MCHC: 30.4 g/dL (ref 30.0–36.0)
MCV: 97.4 fL (ref 80.0–100.0)
Monocytes Absolute: 0.2 10*3/uL (ref 0.1–1.0)
Monocytes Relative: 2 %
Neutro Abs: 9 10*3/uL — ABNORMAL HIGH (ref 1.7–7.7)
Neutrophils Relative %: 93 %
Platelets: 430 10*3/uL — ABNORMAL HIGH (ref 150–400)
RBC: 3.48 MIL/uL — ABNORMAL LOW (ref 3.87–5.11)
RDW: 15.1 % (ref 11.5–15.5)
WBC: 9.6 10*3/uL (ref 4.0–10.5)
nRBC: 0 % (ref 0.0–0.2)

## 2019-02-02 LAB — BASIC METABOLIC PANEL
Anion gap: 8 (ref 5–15)
BUN: 15 mg/dL (ref 8–23)
CO2: 26 mmol/L (ref 22–32)
Calcium: 8.7 mg/dL — ABNORMAL LOW (ref 8.9–10.3)
Chloride: 104 mmol/L (ref 98–111)
Creatinine, Ser: 0.8 mg/dL (ref 0.44–1.00)
GFR calc Af Amer: >60 mL/min (ref >60)
GFR calc non Af Amer: >60 mL/min (ref >60)
Glucose, Bld: 154 mg/dL — ABNORMAL HIGH (ref 70–99)
Potassium: 4.3 mmol/L (ref 3.5–5.1)
Sodium: 138 mmol/L (ref 135–145)

## 2019-02-02 MED ORDER — PREDNISONE 50 MG PO TABS: 50.0000 mg | ORAL_TABLET | Freq: Every day | ORAL | 0 refills | Status: AC

## 2019-02-02 NOTE — Discharge Summary (Addendum)
Physician Discharge Summary  [name] [id number] DOB: 21-May-1941 DOA: 01/28/2019  PCP: [name] Pretty, MD  Admit date: 01/28/2019 Discharge date: 02/02/2019  Admitted From: Home Disposition: Home  Recommendations for Outpatient Follow-up:  1. Follow up with PCP in 1-2 weeks 2. Follow-up with vascular surgery next week 3. Please obtain BMP/CBC in one week 4. Please follow up on the following pending results:  Home Health: None Equipment/Devices: Home oxygen  Discharge Condition: Stable CODE STATUS: Full code Diet recommendation: Cardiac  Subjective: Patient seen and examined.  She feels much better.  No shortness of breath or chest pain.  Brief/Interim Summary: [name] a 78 y.o.femalewith medical history significant ofleft lower extremity deep venous thrombosis complicated by a retroperitoneal bleed therefore unable to get anticoagulation. IVC filter was placed on 01/13/2019 and the patient was discharged home. Was not anticoagulated because of the significant retroperitoneal bleeding. Aspirin was recommended by vascular surgery. she was due to follow-up with vascular surgery on Friday but presented back to ER on 01/28/2019 because of acute shortness of breath. In the meantime she was seen by her primary care physician at [facility] who ordered a CTA of the chest last Thursday which was negative for pulmonary embolism. Has been taking medication for pain in the left lower extremity but continues to have significant pain the leg feels very heavy, is difficult for her to ambulate. She has been wearing her compression hose. Shortness of breath has developed over the past 3 to 4 days and worse with exertion. had a brief episode of chest pain early on after the procedure but has not had any since.  No other respiratory complaints.  She was placed on 6 L of nasal oxygen upon presentation which was tapered down to 3 L while she was still in the ER.  CT scan of the chest was done and  she was found to have extensive pulmonary emboli with right heart strain.  She was admitted by hospital service and after a very long discussion we are risks and benefits were discussed with the patient and patient's son about anticoagulation and they chose to be started on heparin and she was then admitted to hospital service with the plan to continue IV heparin and watch her hemoglobin closely for at least 4 to 5 days in the hospital.  Patient's heparin was eventually switched to Eliquis on 01/31/2019 and she was also having mild COPD exacerbation that day so she was started on Solu-Medrol which helped her improving her breathing and with this, we were able to wean her oxygen down to room air at rest but she was requiring 1 L of oxygen with ambulation.  Patient's hemoglobin remained stable throughout her hospitalization while being on anticoagulation.  She is a stable today for discharge.  She already has Eliquis starter pack that she will start taking tonight.  I am discharging her 3 more days of oral prednisone to complete the course of 5-day steroid for COPD exacerbation.  She will follow with PCP and vascular surgery next week.  Discharge Diagnoses:  Principal Problem:   Acute pulmonary embolism with acute cor pulmonale (HCC) Active Problems:   COPD with acute exacerbation (HCC)   Acute respiratory failure with hypoxia (HCC)   Retroperitoneal bleed   DVT, recurrent, lower extremity, chronic, left (HCC)   Hypokalemia    Discharge Instructions  Discharge Instructions    Discharge patient   Complete by: As directed    Discharge disposition: 01-Home or Self Care  Discharge patient date: 02/02/2019     Allergies as of 02/02/2019      Reactions   Clarithromycin Swelling   Penicillins Swelling   Tongue swelling Did it involve swelling of the face/tongue/throat, SOB, or low BP? Yes Did it involve sudden or severe rash/hives, skin peeling, or any reaction on the inside of your mouth or nose?  Yes Did you need to seek medical attention at a hospital or doctor's office? Yes When did it last happen?childhood If all above answers are "NO", may proceed with cephalosporin use.   Pneumococcal Vaccines Other (See Comments)   Couldn't breathe   Sulfonamide Derivatives Swelling   Estradiol Rash   Cream gives her a rash      Medication List    TAKE these medications   albuterol (2.5 MG/3ML) 0.083% nebulizer solution Commonly known as: PROVENTIL Take 3 mLs (2.5 mg total) by nebulization every 6 (six) hours.   albuterol 108 (90 Base) MCG/ACT inhaler Commonly known as: VENTOLIN HFA Inhale 2 puffs into the lungs every 6 (six) hours as needed for wheezing or shortness of breath.   aspirin 81 MG EC tablet Take 1 tablet (81 mg total) by mouth daily.   Bevespi Aerosphere 9-4.8 MCG/ACT Aero Generic drug: Glycopyrrolate-Formoterol Inhale 2 puffs into the lungs 2 (two) times a day.   cephALEXin 250 MG capsule Commonly known as: KEFLEX Take 250 mg by mouth daily.   cephALEXin 500 MG capsule Commonly known as: KEFLEX Take 500 mg by mouth every 12 (twelve) hours.   cetirizine 10 MG tablet Commonly known as: ZYRTEC Take 10 mg by mouth as needed for allergies.   cyclobenzaprine 10 MG tablet Commonly known as: FLEXERIL Take 10 mg by mouth 3 (three) times daily.   Eliquis DVT/PE Starter Pack 5 MG Tabs Take as directed on package: start with two-5mg  tablets twice daily for 7 days. On day 8, switch to one-5mg  tablet twice daily.   Fluticasone-Salmeterol 250-50 MCG/DOSE Aepb Commonly known as: ADVAIR INHALE 1 DOSE BY MOUTH TWICE DAILY, RINSE MOUTH AFTER USE What changed:   how much to take  how to take this  when to take this  additional instructions   HYDROcodone-acetaminophen 7.5-325 MG tablet Commonly known as: NORCO Take 1 tablet by mouth every 6 (six) hours as needed for moderate pain.   LORazepam 1 MG tablet Commonly known as: ATIVAN Take 0.5-1 mg by mouth at  bedtime.   omeprazole 20 MG capsule Commonly known as: PRILOSEC Take 1 tablet by mouth daily.   predniSONE 50 MG tablet Commonly known as: DELTASONE Take 1 tablet (50 mg total) by mouth daily with breakfast for 3 days. Start taking on: February 03, 2019   traZODone 100 MG tablet Commonly known as: DESYREL Take 100 mg by mouth at bedtime.            Durable Medical Equipment  (From admission, onward)         Start     Ordered   02/02/19 1021  For home use only DME oxygen  Once    Question Answer Comment  Length of Need 6 Months   Mode or (Route) Nasal cannula   Liters per Minute 1   Frequency Continuous (stationary and portable oxygen unit needed)   Oxygen conserving device Yes   Oxygen delivery system Gas      02/02/19 1021   02/02/19 1000  For home use only DME oxygen  Once    Question Answer Comment  Length of Need  6 Months   Mode or (Route) Nasal cannula   Liters per Minute 1   Frequency Continuous (stationary and portable oxygen unit needed)   Oxygen delivery system Gas      02/02/19 1000         Follow-up Information    [name] Pretty, MD Follow up in 1 week(s).   Specialty: Internal Medicine Contact information: [place] [address] [phone]          Allergies  Allergen Reactions  . Clarithromycin Swelling  . Penicillins Swelling    Tongue swelling Did it involve swelling of the face/tongue/throat, SOB, or low BP? Yes Did it involve sudden or severe rash/hives, skin peeling, or any reaction on the inside of your mouth or nose? Yes Did you need to seek medical attention at a hospital or doctor's office? Yes When did it last happen?childhood If all above answers are "NO", may proceed with cephalosporin use.   . Pneumococcal Vaccines Other (See Comments)    Couldn't breathe  . Sulfonamide Derivatives Swelling  . Estradiol Rash    Cream gives her a rash    Consultations: Vascular  surgery   Procedures/Studies: Dg Lumbar Spine Complete  Result Date: 01/13/2019 CLINICAL DATA:  LEFT leg swelling, LEFT lower flank pain radiating to leg. No known injury. EXAM: LUMBAR SPINE - COMPLETE 4+ VIEW COMPARISON:  None. FINDINGS: Mild dextroscoliosis. No evidence of acute vertebral body subluxation. No acute or suspicious osseous lesion. No fracture line or displaced fracture fragment seen. Mild degenerative spondylosis throughout the lumbar spine, with associated mild disc space narrowings and mild osseous spurring. Additional degenerative facet hypertrophy within the lower lumbar spine causing at least mild osseous neural foramen narrowing. Aortic atherosclerosis. Visualized paravertebral soft tissues are otherwise unremarkable. IMPRESSION: 1. No acute findings. 2. Mild degenerative change, as detailed above. If symptoms could be considered radiculopathic, would consider nonemergent lumbar spine MRI to exclude associated nerve root impingement. 3. Aortic atherosclerosis. Electronically Signed   By: Franki Cabot M.D.   On: 01/13/2019 10:00   Ct Angio Chest Pe W And/or Wo Contrast  Result Date: 01/28/2019 CLINICAL DATA:  Chest pain, shortness of breath.  DVT EXAM: CT ANGIOGRAPHY CHEST WITH CONTRAST TECHNIQUE: Multidetector CT imaging of the chest was performed using the standard protocol during bolus administration of intravenous contrast. Multiplanar CT image reconstructions and MIPs were obtained to evaluate the vascular anatomy. CONTRAST:  17mL OMNIPAQUE IOHEXOL 350 MG/ML SOLN COMPARISON:  CT 08/27/2014.  Chest x-ray 01/28/2019 FINDINGS: Cardiovascular: Pulmonary vasculature is adequately opacified. Numerous filling defects within the lobar and segmental branches involving all pulmonary lobes. No main branch filling defect or saddle embolism. Heart size is normal. Leftward bowing of the interventricular septum and RV-LV ratio of just greater than 1.0 suggest right heart strain. No pericardial  effusion. Scattered atherosclerotic calcifications of the aorta and coronary arteries. Mediastinum/Nodes: No enlarged mediastinal, hilar, or axillary lymph nodes. Thyroid Gland and trachea demonstrate no significant findings. Small hiatal hernia. Lungs/Pleura: No focal airspace consolidation. Mild bibasilar linear scarring/atelectasis. No pleural effusion or pneumothorax. Upper Abdomen: No acute abnormality. Musculoskeletal: No acute osseous findings. Bilateral breast prostheses. Review of the MIP images confirms the above findings. IMPRESSION: Extensive acute pulmonary emboli involving the lobar and segmental branches of all pulmonary lobes. No main branch or saddle embolism. There is CT evidence of right heart strain. These results were called by telephone at the time of interpretation on 01/28/2019 at 12:10 pm to Dr. Gareth Morgan , who  verbally acknowledged these results. Electronically Signed   By: Davina Poke M.D.   On: 01/28/2019 12:11   Ct Angio Ao+bifem W & Or Wo Contrast  Result Date: 01/13/2019 CLINICAL DATA:  78 year old female with a history of leg edema, left lower extremity swelling, flank pain EXAM: CT ANGIOGRAPHY OF ABDOMINAL AORTA WITH ILIOFEMORAL RUNOFF TECHNIQUE: Multidetector CT imaging of the abdomen, pelvis and lower extremities was performed using the standard protocol during bolus administration of intravenous contrast. Multiplanar CT image reconstructions and MIPs were obtained to evaluate the vascular anatomy. CONTRAST:  161mL OMNIPAQUE IOHEXOL 350 MG/ML SOLN COMPARISON:  Duplex performed January 10, 2019, no prior CT imaging of the abdomen FINDINGS: VASCULAR Aorta: Mild atherosclerotic changes of the abdominal aorta. No dissection or aneurysm. Celiac: Mild atherosclerosis at the origin of the celiac artery. Branches are patent. SMA: Mild atherosclerotic changes at the origin of the SMA. Branches are patent. Renals: Main right renal artery patent with mild atherosclerosis at the  origin. Fusiform dilation/ectasia of the distal main renal artery which involves the division. Greatest diameter measured is 7 mm. Mild atherosclerosis at the origin of the left renal artery with no high-grade stenosis. IMA: IMA remains patent. RIGHT Lower Extremity Mild atherosclerotic changes of the right common iliac artery without high-grade stenosis or occlusion. Hypogastric artery is patent as well as the anterior and posterior division. External iliac artery patent with no significant atherosclerotic changes. High bifurcation of the common femoral artery near the inguinal ligament. Profunda femoris patent as well as patent thigh branches. Minimal atherosclerosis of the superficial femoral artery. Popliteal arteries patent. Trifurcation is patent with no significant atherosclerosis of the tibial arteries. Attenuation of the contrast bolus at the ankle secondary to late arrival. LEFT Lower Extremity Mild atherosclerotic changes of the left common iliac artery. No dissection or aneurysm. No high-grade stenosis or occlusion. Hypogastric arteries patent. External iliac artery patent with minimal atherosclerotic changes. Common femoral artery patent with minimal atherosclerosis. Profunda femoris patent as well as thigh branches. Trace atherosclerotic changes of the left superficial femoral artery. Popliteal artery is patent. Trifurcation is patent with minimal atherosclerosis of the tibial arteries. Decreased attenuation at the ankle likely secondary to late arrival of the contrast. Veins: There is asymmetric expansion of the left common iliac vein, external iliac vein, pelvic veins compared to the right. Surrounding inflammatory changes. Visualized left iliac veins on the delayed phase/venous imaging confirms thrombus extending through the confluence of the iliac veins. There is asymmetric expansion of the left common femoral vein and femoral vein with surrounding edema. Asymmetric appearance of the popliteal vein  on the left compared to the right with hyperdense appearance of the veins as they extend into the left tibial veins. The left lower extremity was not imaged on the delayed phase venous imaging. Review of the MIP images confirms the above findings. NON-VASCULAR Lower chest: Incomplete imaging of bilateral breast reconstruction/augmentation. No acute finding of the lower chest with atelectasis/scarring. Nodules at the left lung base measured 2 mm-3 mm, new from the comparison CT of August 27, 2014 in a region of scarring. Hepatobiliary: Unremarkable.  Unremarkable gallbladder. Pancreas: Unremarkable Spleen: Unremarkable Adrenals/Urinary Tract: Unremarkable appearance of the bilateral adrenal glands. Right kidney unremarkable with no hydronephrosis or nephrolithiasis. Left kidney has been anteriorly displaced secondary to retroperitoneal hemorrhage. No evidence of active hemorrhage of the left kidney. There is a low-density fluid cyst at the inferior collecting system which measures 5.9 cm compatible with Bosniak 1 cyst. No hydronephrosis or nephrolithiasis. Stomach/Bowel: Unremarkable  stomach with small hiatal hernia. Unremarkable small bowel. No abnormal distension no transition point. Normal appendix. Colonic diverticula without associated inflammatory changes. Lymphatic: No lymphadenopathy. Reproductive: Hysterectomy. Other: Left-sided retroperitoneal hemorrhage which is inseparable from the psoas muscle. No evidence of extravasated contrast to indicate bleeding/hemorrhage. The largest component is near the L3 level estimated 8.3 cm in diameter. The source is not identified on the current CT. Fat containing umbilical hernia. Musculoskeletal: No acute displaced fracture. Degenerative changes of the spine. IMPRESSION: Left-sided retroperitoneal hemorrhage with no evidence of active extravasation. The greatest diameter is estimated 8.3 cm, with the majority of hemorrhage inseparable from the psoas muscle, most likely a  spontaneous retroperitoneal hemorrhage. CT demonstrates left-sided DVT, involving left common iliac vein, internal iliac veins, external iliac vein, common femoral vein, femoral vein, popliteal vein, and possibly tibial veins. Correlation with duplex might be useful. These above results were discussed by telephone at the time of interpretation on 01/13/2019 at 2:45 pm with Dr. Jeannie Done PFEIFFER Mild aortic and peripheral arterial disease, as above. Aortic Atherosclerosis (ICD10-I70.0). Chronic changes of the lung bases. There is a cluster of small nodules at the left lung base which is new from the comparison CT of 2016. While these most likely are inflammatory/infectious, follow-up is indicated. Non-contrast chest CT is recommended in 12 months.This recommendation follows the consensus statement: Guidelines for Management of Incidental Pulmonary Nodules Detected on CT Images: From the Fleischner Society 2017; Radiology 2017; 284:228-243. Additional ancillary findings as above. Signed, Dulcy Fanny. Dellia Nims, RPVI [facility] Radiology Electronically Signed   By: Corrie Mckusick D.O.   On: 01/13/2019 14:47   US Venous Img Lower Unilateral Left  Result Date: 01/10/2019 CLINICAL DATA:  Left lower extremity edema. EXAM: LEFT LOWER EXTREMITY VENOUS DOPPLER ULTRASOUND TECHNIQUE: Gray-scale sonography with graded compression, as well as color Doppler and duplex ultrasound were performed to evaluate the lower extremity deep venous systems from the level of the common femoral vein and including the common femoral, femoral, profunda femoral, popliteal and calf veins including the posterior tibial, peroneal and gastrocnemius veins when visible. The superficial great saphenous vein was also interrogated. Spectral Doppler was utilized to evaluate flow at rest and with distal augmentation maneuvers in the common femoral, femoral and popliteal veins. COMPARISON:  None. FINDINGS:  Contralateral Common Femoral Vein: Respiratory phasicity is normal and symmetric with the symptomatic side. No evidence of thrombus. Normal compressibility. Common Femoral Vein: No evidence of thrombus. Normal compressibility, respiratory phasicity and response to augmentation. Saphenofemoral Junction: No evidence of thrombus. Normal compressibility and flow on color Doppler imaging. Profunda Femoral Vein: No evidence of thrombus. Normal compressibility and flow on color Doppler imaging. Femoral Vein: No evidence of thrombus. Normal compressibility, respiratory phasicity and response to augmentation. Popliteal Vein: No evidence of thrombus. Normal compressibility, respiratory phasicity and response to augmentation. Calf Veins: No evidence of thrombus. Normal compressibility and flow on color Doppler imaging. Superficial Great Saphenous Vein: No evidence of thrombus. Normal compressibility. Venous Reflux:  None. Other Findings: No evidence of superficial thrombophlebitis or abnormal fluid collection. IMPRESSION: No evidence of left lower extremity deep venous thrombosis. Electronically Signed   By: Aletta Edouard M.D.   On: 01/10/2019 15:58   Vas [place] Ivc/iliac (venous Only)  Result Date: 01/13/2019 IVC/ILIAC STUDY Indications: DVT left common iliac vein Risk Factors: None. Other Factors: Acute DVT Left CFV, FV, PopV, GastV, PTV, PeroV SSV.  Comparison Study: No prior studies. Performing Technologist: Oliver Hum RVT  Examination Guidelines: A complete  evaluation includes B-mode imaging, spectral Doppler, color Doppler, and power Doppler as needed of all accessible portions of each vessel. Bilateral testing is considered an integral part of a complete examination. Limited examinations for reoccurring indications may be performed as noted.  IVC/Iliac Findings: +----------+------+--------+--------+    IVC    PatentThrombusComments +----------+------+--------+--------+ IVC Prox  patent                  +----------+------+--------+--------+ IVC Mid   patent                 +----------+------+--------+--------+ IVC Distalpatent                 +----------+------+--------+--------+  +-----------------+---------+-----------+---------+-----------+--------+        CIV       RT-PatentRT-ThrombusLT-PatentLT-ThrombusComments +-----------------+---------+-----------+---------+-----------+--------+ Common Iliac Prox patent                         acute            +-----------------+---------+-----------+---------+-----------+--------+ +------------------------+---------+-----------+---------+-----------+--------+           EIV           RT-PatentRT-ThrombusLT-PatentLT-ThrombusComments +------------------------+---------+-----------+---------+-----------+--------+ External Iliac Vein Prox patent                         acute            +------------------------+---------+-----------+---------+-----------+--------+  Summary: IVC/Iliac: There is no evidence of thrombus involving the IVC. There is no evidence of thrombus involving the right common iliac vein. There is evidence of acute thrombus involving the left common iliac vein. There is no evidence of thrombus involving the right external iliac vein. There is evidence of acute thrombus involving the left external iliac vein.  *See table(s) above for measurements and observations.  Electronically signed by Servando Snare MD on 01/13/2019 at 9:55:20 PM.   Final    Dg Chest Portable 1 View  Result Date: 01/28/2019 CLINICAL DATA:  Shortness of breath. EXAM: PORTABLE CHEST 1 VIEW COMPARISON:  Radiographs of December 26, 2018. FINDINGS: Stable cardiomediastinal silhouette. No pneumothorax or pleural effusion is noted. Both lungs are clear. The visualized skeletal structures are unremarkable. IMPRESSION: No active disease. Electronically Signed   By: Marijo Conception M.D.   On: 01/28/2019 10:10   Vas [place] Lower Extremity Venous (dvt) (only [facility])  Result Date: 01/13/2019  Lower Venous Study Indications: Swelling.  Risk Factors: None identified. Comparison Study: 01/10/2019 - Negative for DVT. Performing Technologist: Oliver Hum RVT  Examination Guidelines: A complete evaluation includes B-mode imaging, spectral Doppler, color Doppler, and power Doppler as needed of all accessible portions of each vessel. Bilateral testing is considered an integral part of a complete examination. Limited examinations for reoccurring indications may be performed as noted.  +-----+---------------+---------+-----------+----------+-------+ RIGHTCompressibilityPhasicitySpontaneityPropertiesSummary +-----+---------------+---------+-----------+----------+-------+ CFV  Full           Yes      Yes                          +-----+---------------+---------+-----------+----------+-------+   +---------+---------------+---------+-----------+----------+-------+ LEFT     CompressibilityPhasicitySpontaneityPropertiesSummary +---------+---------------+---------+-----------+----------+-------+ CFV      None           No       No                   Acute   +---------+---------------+---------+-----------+----------+-------+ SFJ      None  Acute   +---------+---------------+---------+-----------+----------+-------+ FV Prox  None           No       No                   Acute   +---------+---------------+---------+-----------+----------+-------+ FV Mid   None           No       No                   Acute   +---------+---------------+---------+-----------+----------+-------+ FV DistalNone           No       No                   Acute   +---------+---------------+---------+-----------+----------+-------+ PFV      None                                         Acute   +---------+---------------+---------+-----------+----------+-------+ POP      None           No       No                    Acute   +---------+---------------+---------+-----------+----------+-------+ PTV      None                                         Acute   +---------+---------------+---------+-----------+----------+-------+ PERO     None                                         Acute   +---------+---------------+---------+-----------+----------+-------+ Gastroc  None                                         Acute   +---------+---------------+---------+-----------+----------+-------+ SSV      None                                         Acute   +---------+---------------+---------+-----------+----------+-------+     Summary: Right: No evidence of common femoral vein obstruction. Left: Findings consistent with acute deep vein thrombosis involving the left common femoral vein, left femoral vein, left proximal profunda vein, left popliteal vein, left posterior tibial veins, left peroneal veins, and left gastrocnemius veins. Findings consistent with acute superficial vein thrombosis involving the left small saphenous vein. No cystic structure found in the popliteal fossa.  *See table(s) above for measurements and observations. Electronically signed by Servando Snare MD on 01/13/2019 at 9:55:39 PM.    Final    Hybrid Or Imaging (mc Only)  Result Date: 01/13/2019 There is no interpretation for this exam.  This order is for images obtained during a surgical procedure.  Please See "Surgeries" Tab for more information regarding the procedure.     Discharge Exam: Vitals:   02/02/19 0746 02/02/19 0851  BP:  126/83  Pulse:  (!) 104  Resp:  17  Temp:  97.9 F (36.6 C)  SpO2: 92% 92%   Vitals:   02/02/19 0300 02/02/19 0329 02/02/19 0746 02/02/19 0851  BP:  126/76  126/83  Pulse: 66 86  (!) 104  Resp: 15 13  17   Temp:  (!) 97.4 F (36.3 C)  97.9 F (36.6 C)  TempSrc:  Oral  Oral  SpO2: (!) 89% 92% 92% 92%  Weight:      Height:        General: Pt is alert, awake, not in acute  distress Cardiovascular: RRR, S1/S2 +, no rubs, no gallops Respiratory: CTA bilaterally, no wheezing, no rhonchi Abdominal: Soft, NT, ND, bowel sounds + Extremities: no edema, no cyanosis    The results of significant diagnostics from this hospitalization (including imaging, microbiology, ancillary and laboratory) are listed below for reference.     Microbiology: Recent Results (from the past 240 hour(s))  SARS CORONAVIRUS 2 (TAT 6-24 HRS) Nasopharyngeal Nasopharyngeal Swab     Status: None   Collection Time: 01/28/19  3:01 PM   Specimen: Nasopharyngeal Swab  Result Value Ref Range Status   SARS Coronavirus 2 NEGATIVE NEGATIVE Final    Comment: (NOTE) SARS-CoV-2 target nucleic acids are NOT DETECTED. The SARS-CoV-2 RNA is generally detectable in upper and lower respiratory specimens during the acute phase of infection. Negative results do not preclude SARS-CoV-2 infection, do not rule out co-infections with other pathogens, and should not be used as the sole basis for treatment or other patient management decisions. Negative results must be combined with clinical observations, patient history, and epidemiological information. The expected result is Negative. Fact Sheet for Patients: SugarRoll.be Fact Sheet for Healthcare Providers: https://www.woods-mathews.com/ This test is not yet approved or cleared by the [place] FDA and  has been authorized for detection and/or diagnosis of SARS-CoV-2 by FDA under an Emergency Use Authorization (EUA). This EUA will remain  in effect (meaning this test can be used) for the duration of the COVID-19 declaration under Section 56 4(b)(1) of the Act, 21 U.S.C. section 360bbb-3(b)(1), unless the authorization is terminated or revoked sooner. Performed at [facility], [place] [address], [place], [place] [address]      Labs: BNP (last 3 results) Recent Labs    01/28/19 1012  BNP 256.7*    Basic Metabolic Panel: Recent Labs  Lab 01/29/19 0716 01/30/19 0722 01/31/19 0555 02/01/19 0249 02/02/19 0453  NA 140 139 138 138 138  K 3.5 4.0 4.5 4.4 4.3  CL 102 102 105 103 104  CO2 27 26 24 26 26   GLUCOSE 108* 100* 97 194* 154*  BUN 16 15 14 15 15   CREATININE 0.84 0.78 0.84 0.78 0.80  CALCIUM 8.5* 8.5* 8.8* 8.8* 8.7*  MG  --  2.0  --   --   --    Liver Function Tests: Recent Labs  Lab 01/28/19 1012  AST 29  ALT 19  ALKPHOS 88  BILITOT 1.5*  PROT 6.7  ALBUMIN 3.5   No results for input(s): LIPASE, AMYLASE in the last 168 hours. No results for input(s): AMMONIA in the last 168 hours. CBC: Recent Labs  Lab 01/28/19 1012  01/29/19 0716  01/30/19 2120 01/31/19 0555 01/31/19 1947 02/01/19 1041 02/02/19 0453  WBC 11.7*  --  8.4  --   --   --   --  8.3 9.6  NEUTROABS 9.8*  --   --   --   --   --   --  7.5 9.0*  HGB 12.4   < > 11.3*   < >  10.9* 11.4* 11.3* 11.5* 10.3*  HCT 41.0   < > 38.3   < > 36.8 38.6 36.5 38.0 33.9*  MCV 98.3  --  100.0  --   --   --   --  98.7 97.4  PLT 482*  --  474*  --   --   --   --  498* 430*   < > = values in this interval not displayed.   Cardiac Enzymes: No results for input(s): CKTOTAL, CKMB, CKMBINDEX, TROPONINI in the last 168 hours. BNP: Invalid input(s): POCBNP CBG: No results for input(s): GLUCAP in the last 168 hours. D-Dimer No results for input(s): DDIMER in the last 72 hours. Hgb A1c No results for input(s): HGBA1C in the last 72 hours. Lipid Profile No results for input(s): CHOL, HDL, LDLCALC, TRIG, CHOLHDL, LDLDIRECT in the last 72 hours. Thyroid function studies No results for input(s): TSH, T4TOTAL, T3FREE, THYROIDAB in the last 72 hours.  Invalid input(s): FREET3 Anemia work up No results for input(s): VITAMINB12, FOLATE, FERRITIN, TIBC, IRON, RETICCTPCT in the last 72 hours. Urinalysis No results found for: COLORURINE, APPEARANCEUR, [place], [place], [place], [place], [place], [place], PROTEINUR,  UROBILINOGEN, NITRITE, LEUKOCYTESUR Sepsis Labs Invalid input(s): PROCALCITONIN,  WBC,  LACTICIDVEN Microbiology Recent Results (from the past 240 hour(s))  SARS CORONAVIRUS 2 (TAT 6-24 HRS) Nasopharyngeal Nasopharyngeal Swab     Status: None   Collection Time: 01/28/19  3:01 PM   Specimen: Nasopharyngeal Swab  Result Value Ref Range Status   SARS Coronavirus 2 NEGATIVE NEGATIVE Final    Comment: (NOTE) SARS-CoV-2 target nucleic acids are NOT DETECTED. The SARS-CoV-2 RNA is generally detectable in upper and lower respiratory specimens during the acute phase of infection. Negative results do not preclude SARS-CoV-2 infection, do not rule out co-infections with other pathogens, and should not be used as the sole basis for treatment or other patient management decisions. Negative results must be combined with clinical observations, patient history, and epidemiological information. The expected result is Negative. Fact Sheet for Patients: SugarRoll.be Fact Sheet for Healthcare Providers: https://www.woods-mathews.com/ This test is not yet approved or cleared by the [place] FDA and  has been authorized for detection and/or diagnosis of SARS-CoV-2 by FDA under an Emergency Use Authorization (EUA). This EUA will remain  in effect (meaning this test can be used) for the duration of the COVID-19 declaration under Section 56 4(b)(1) of the Act, 21 U.S.C. section 360bbb-3(b)(1), unless the authorization is terminated or revoked sooner. Performed at [facility], [place] [address], [place], [place] [address]      Time coordinating discharge: Over 30 minutes  SIGNED:   Darliss Cheney, MD  [facility] 02/02/2019, 10:25 AM Pager [phone]  If 7PM-7AM, please contact night-coverage [email or web address] Password [email or web address]

## 2019-02-02 NOTE — TOC Transition Note (Addendum)
Transition of Care [facility]) - CM/SW Discharge Note   Patient Details  Name: [name] MRN: [id number] Date of Birth: 1940/06/21  Transition [facility] Care [facility]) CM/SW Contact:  Claudie Leach, RN Phone Number: 02/02/2019, 10:56 AM   Clinical Narrative:    Pt to d/c home with DME O2.  Patient adamant that she wants a small portable tank and does not want large tanks or concentrator at home.  Explained to patient that it is required that she have 2 sources of oxygen at home.  Patient has used [employer] before and does not want to use again.  After contacting several agencies to inquire about options, [facility] is able to take referral.  Patient will still need a large source of O2 in the home, but they will give her a portable concentrator today and clear with insurance after Labor Day holiday Monday.    Also, [facility] will follow patient with the COPD bridge program, which includes a home Respiratory therapist for 30 days.    [facility] will deliver oxygen to hospital prior to discharge and to home today after coordinating time with patient.    Eliquis provided by [facility] pharmacy on Friday.    Final next level of care: Home/Self Care Barriers to Discharge: No Barriers Identified   Patient Goals and CMS Choice Patient states their goals for this hospitalization and ongoing recovery are:: to get back home       Discharge Plan and Services                DME Arranged: Oxygen DME Agency: [facility]) Date DME Agency Contacted: 02/02/19 Time DME Agency Contacted: 74 Representative spoke with at DME Agency: [place] Arranged: Respirator Therapy(through [facility] COPD Bridge program)

## 2019-02-02 NOTE — Progress Notes (Signed)
Care plan reviewed. Pt is progressing. She appeared alert and oriented x 4. Vital signs remained stable. Continue 1 LPM on O2 NCL, SPO2 89-95%  When Pt ambulated to bathroom, her HR 128 ,sinus tachycardia on monitor, asymptomatically.  Most of the time HR remained 70s-80s  when she's resting. BP stable.  Her left leg pain controlled well with Oxycodone, able to sleep very well tonight.   Continue to monitor.  Kennyth Lose, RN

## 2019-02-02 NOTE — Progress Notes (Signed)
Discharge AVS meds take and those due reviewed with pt. Follow up appointments and when to call MD reviewed. All questions and concerns addressed. No further questions at this time. D/c IV and TELE, CCMD notified. D/C home per orders. Awaiting home O2 to be delivered prior to d/c.

## 2019-02-02 NOTE — Progress Notes (Signed)
SATURATION QUALIFICATIONS: (This note is used to comply with regulatory documentation for home oxygen)  Patient Saturations on Room Air at Rest = 91%  Patient Saturations on Room Air while Ambulating = 86%  Patient Saturations on 1 Liters of oxygen while Ambulating = 94%  Please briefly explain why patient needs home oxygen: pt desat while ambulating on RA.

## 2019-02-04 ENCOUNTER — Telehealth: Payer: Self-pay | Admitting: Hematology

## 2019-02-04 ENCOUNTER — Encounter: Payer: Self-pay | Admitting: Hematology

## 2019-02-04 NOTE — Telephone Encounter (Signed)
Received a new patient referral from Dr. Deland Pretty at [facility] for DVT and abnormal cbc. [name] has been cld and scheduled to see Dr. Irene Limbo on 10/15 at [facility] to arrive 20 minutes early. Letter mailed.

## 2019-02-06 DIAGNOSIS — R600 Localized edema: Secondary | ICD-10-CM | POA: Diagnosis not present

## 2019-02-06 DIAGNOSIS — D649 Anemia, unspecified: Secondary | ICD-10-CM | POA: Diagnosis not present

## 2019-02-06 DIAGNOSIS — I2699 Other pulmonary embolism without acute cor pulmonale: Secondary | ICD-10-CM | POA: Diagnosis not present

## 2019-02-06 DIAGNOSIS — R58 Hemorrhage, not elsewhere classified: Secondary | ICD-10-CM | POA: Diagnosis not present

## 2019-02-06 DIAGNOSIS — R918 Other nonspecific abnormal finding of lung field: Secondary | ICD-10-CM | POA: Diagnosis not present

## 2019-02-06 DIAGNOSIS — F419 Anxiety disorder, unspecified: Secondary | ICD-10-CM | POA: Diagnosis not present

## 2019-02-09 NOTE — Assessment & Plan Note (Signed)
Careful to avoid stasis. No recurrence. Continues Eliquis

## 2019-02-09 NOTE — Assessment & Plan Note (Signed)
Modest reactive component. Nebulizer helps. Plan- try samples Bevespi instead of Trelegy to avoid steroid component/ thrush. Refill rescue inhaler. She does ask depo inj today while here- discussed.

## 2019-02-13 DIAGNOSIS — Z95828 Presence of other vascular implants and grafts: Secondary | ICD-10-CM | POA: Diagnosis not present

## 2019-02-13 DIAGNOSIS — R600 Localized edema: Secondary | ICD-10-CM | POA: Diagnosis not present

## 2019-02-13 DIAGNOSIS — J449 Chronic obstructive pulmonary disease, unspecified: Secondary | ICD-10-CM | POA: Diagnosis not present

## 2019-02-13 DIAGNOSIS — I2609 Other pulmonary embolism with acute cor pulmonale: Secondary | ICD-10-CM | POA: Diagnosis not present

## 2019-02-13 DIAGNOSIS — J9691 Respiratory failure, unspecified with hypoxia: Secondary | ICD-10-CM | POA: Diagnosis not present

## 2019-02-13 DIAGNOSIS — Z7901 Long term (current) use of anticoagulants: Secondary | ICD-10-CM | POA: Diagnosis not present

## 2019-02-14 ENCOUNTER — Encounter: Payer: Self-pay | Admitting: *Deleted

## 2019-02-14 ENCOUNTER — Ambulatory Visit (INDEPENDENT_AMBULATORY_CARE_PROVIDER_SITE_OTHER): Payer: PPO | Admitting: Family

## 2019-02-14 ENCOUNTER — Other Ambulatory Visit: Payer: Self-pay

## 2019-02-14 ENCOUNTER — Encounter: Payer: Self-pay | Admitting: Family

## 2019-02-14 VITALS — BP 146/91 | HR 98 | Temp 97.9°F | Resp 14 | Ht 66.0 in | Wt 159.0 lb

## 2019-02-14 DIAGNOSIS — I2699 Other pulmonary embolism without acute cor pulmonale: Secondary | ICD-10-CM

## 2019-02-14 DIAGNOSIS — K661 Hemoperitoneum: Secondary | ICD-10-CM

## 2019-02-14 DIAGNOSIS — Z95828 Presence of other vascular implants and grafts: Secondary | ICD-10-CM

## 2019-02-14 DIAGNOSIS — I82402 Acute embolism and thrombosis of unspecified deep veins of left lower extremity: Secondary | ICD-10-CM

## 2019-02-14 NOTE — Patient Instructions (Signed)

## 2019-02-14 NOTE — Progress Notes (Signed)
CC: Follow up spontaneous retroperitoneal bleed and left LE DVT, +PE, S/P IVC filter placement   History of Present Illness  [name] is a 78 y.o. (11-25-1940) female who is s/p : 1.  Ultrasound-guided cannulation right common femoral vein 2.  IVC venogram 3.  Placement of Cook celect filter in infrarenal position On 01-13-19 by [name].  Pt had presented with what appeared to be spontaneous left retroperitoneal hematoma.  At the same time she had swelling of her left lower extremity, found to have extensive left lower extremity DVT.  Given that she has contraindication to anticoagulation at that time, she was indicated for IVC filter placement.  She returns today for post op follow up.  She takes a daily 81 mg ASA and Eliquis.   She states that her left leg swelling has mostly resolved. She states that her PCP put her on a low dose diuretic, and the left leg swelling decreased.   She states that she has mild dyspnea on exertion, that her  Breathing has been a bit worse since the new DVT. She states that she does have COPD. She quit smoking about 2005.    Past Medical History:  Diagnosis Date  . Allergic rhinitis   . Asthma   . Chronic airway obstruction, not elsewhere classified   . Depressive disorder, not elsewhere classified   . Esophageal reflux     Social History Social History   Tobacco Use  . Smoking status: Former Smoker    Packs/day: 0.25    Years: 40.00    Pack years: 10.00    Types: Cigarettes    Quit date: 05/27/2013    Years since quitting: 5.7  . Smokeless tobacco: Never Used  Substance Use Topics  . Alcohol use: No  . Drug use: No    Family History Family History  Problem Relation Age of Onset  . Diabetes Mother   . Coronary artery disease Mother   . Emphysema Father   . Coronary artery disease Brother     Surgical History Past Surgical History:  Procedure Laterality Date  . ABDOMINAL HYSTERECTOMY    . CYSTOSCOPY    . NASAL SINUS  SURGERY    . VENA CAVA FILTER PLACEMENT N/A 01/13/2019   Procedure: INSERTION VENA-CAVA FILTER;  Surgeon: Waynetta Sandy, MD;  Location: [facility];  Service: Vascular;  Laterality: N/A;    Allergies  Allergen Reactions  . Clarithromycin Swelling  . Penicillins Swelling    Tongue swelling Did it involve swelling of the face/tongue/throat, SOB, or low BP? Yes Did it involve sudden or severe rash/hives, skin peeling, or any reaction on the inside of your mouth or nose? Yes Did you need to seek medical attention at a hospital or doctor's office? Yes When did it last happen?childhood If all above answers are "NO", may proceed with cephalosporin use.   . Pneumococcal Vaccines Other (See Comments)    Couldn't breathe  . Sulfonamide Derivatives Swelling  . Estradiol Rash    Cream gives her a rash    Current Outpatient Medications  Medication Sig Dispense Refill  . albuterol (PROVENTIL) (2.5 MG/3ML) 0.083% nebulizer solution Take 3 mLs (2.5 mg total) by nebulization every 6 (six) hours. 75 mL 3  . albuterol (VENTOLIN HFA) 108 (90 Base) MCG/ACT inhaler Inhale 2 puffs into the lungs every 6 (six) hours as needed for wheezing or shortness of breath. 18 g 12  . aspirin EC 81 MG EC tablet Take 1 tablet (  81 mg total) by mouth daily. 30 tablet 0  . cephALEXin (KEFLEX) 250 MG capsule Take 250 mg by mouth daily.    . cetirizine (ZYRTEC) 10 MG tablet Take 10 mg by mouth as needed for allergies.     . cyclobenzaprine (FLEXERIL) 10 MG tablet Take 10 mg by mouth 3 (three) times daily.    . Eliquis DVT/PE Starter Pack (ELIQUIS STARTER PACK) 5 MG TABS Take as directed on package: start with two-5mg  tablets twice daily for 7 days. On day 8, switch to one-5mg  tablet twice daily. 1 each 0  . Fluticasone-Salmeterol (ADVAIR) 250-50 MCG/DOSE AEPB INHALE 1 DOSE BY MOUTH TWICE DAILY, RINSE MOUTH AFTER USE (Patient taking differently: Inhale 1 puff into the lungs 2 (two) times daily. ) 60 each 2  .  Glycopyrrolate-Formoterol (BEVESPI AEROSPHERE) 9-4.8 MCG/ACT AERO Inhale 2 puffs into the lungs 2 (two) times a day. 2 g 0  . HYDROcodone-acetaminophen (NORCO) 7.5-325 MG tablet Take 1 tablet by mouth every 6 (six) hours as needed for moderate pain. 20 tablet 0  . LORazepam (ATIVAN) 1 MG tablet Take 0.5-1 mg by mouth at bedtime.    Marland Kitchen omeprazole (PRILOSEC) 20 MG capsule Take 1 tablet by mouth daily.     . traZODone (DESYREL) 100 MG tablet Take 100 mg by mouth at bedtime.      No current facility-administered medications for this visit.     On ROS today: See HPI for pertinent positives and negatives.  Physical Examination  Vitals:   02/14/19 1201  BP: (!) 146/91  Pulse: 98  Resp: 14  Temp: 97.9 F (36.6 C)  TempSrc: Temporal  SpO2: 98%  Weight: 159 lb (72.1 kg)  Height: 5\' 6"  (1.676 m)   Body mass index is 25.66 kg/m.    General: A&O x 3, WD female in NAD HEENT: Grossly intact and WNL.  Pulmonary: Sym exp, respirations are non labored, limited air movement in all fields, no rales or rhonchi, + few expiratory wheezes Cardiac: Regular rate and rhythm, no detected murmur.     Vascular: Vessel Right Left  Radial Palpable Palpable  Carotid  without bruit  without bruit  Aorta Not palpable N/A  Popliteal  palpable Not palpable  PT Palpable Not Palpable  DP Palpable Palpable   Gastrointestinal: soft, NTND, -G/R, - HSM, - masses palpated, - CVAT B. Musculoskeletal: M/S 5/5 throughout, Extremities without ischemic changes. Left leg is slightly larger than right leg, see photo above, 1+ pitting edema in left ankle.  Skin: No rash, no cellulitis, no ulcers noted.  Neurologic: Pain and light touch intact in extremities  Motor exam as listed above. Psychiatric: Normal thought content, somewhat anxious, mood appropriate to clinical situation.   CTA chest (01-28-19): Extensive acute pulmonary emboli involving the lobar and segmental branches of all pulmonary lobes. No main branch  or saddle embolism. There is CT evidence of right heart strain.    Medical Decision Making  [name] is a 78 y.o. female who is s/p placement of IVC filter on 01-13-19 by [name].  Pt had presented with what appeared to be spontaneous left retroperitoneal hematoma.  At the same time she had swelling of her left lower extremity, found to have extensive left lower extremity DVT.  CTA of chest on 01-28-19 showed extensive acute pulmonary emboli involving the lobar and segmental branches of all pulmonary lobes.  Left leg swelling has mostly resolved.   [name] spoke with pt and pt daughter in law, and  examined pt.  Since she is now on Eliquis, the IVC filter needs to be removed within 6 weeks, by [name]. Will schedule this.   Clemon Chambers, RN, MSN, [name]-C [facility] Office: [phone]  Clinic MD: Donzetta Matters  02/14/2019, 12:11 PM

## 2019-02-25 ENCOUNTER — Emergency Department (HOSPITAL_COMMUNITY)
Admission: EM | Admit: 2019-02-25 | Discharge: 2019-02-25 | Disposition: A | Discharge status: Home / Self Care | Payer: PPO | Attending: Emergency Medicine | Admitting: Emergency Medicine

## 2019-02-25 ENCOUNTER — Emergency Department (HOSPITAL_COMMUNITY): Payer: PPO

## 2019-02-25 ENCOUNTER — Other Ambulatory Visit: Payer: Self-pay

## 2019-02-25 ENCOUNTER — Other Ambulatory Visit: Payer: Self-pay | Admitting: *Deleted

## 2019-02-25 ENCOUNTER — Encounter (HOSPITAL_COMMUNITY): Payer: Self-pay | Admitting: Emergency Medicine

## 2019-02-25 DIAGNOSIS — Z7901 Long term (current) use of anticoagulants: Secondary | ICD-10-CM | POA: Insufficient documentation

## 2019-02-25 DIAGNOSIS — I82402 Acute embolism and thrombosis of unspecified deep veins of left lower extremity: Secondary | ICD-10-CM | POA: Diagnosis not present

## 2019-02-25 DIAGNOSIS — J449 Chronic obstructive pulmonary disease, unspecified: Secondary | ICD-10-CM | POA: Insufficient documentation

## 2019-02-25 DIAGNOSIS — J45909 Unspecified asthma, uncomplicated: Secondary | ICD-10-CM | POA: Insufficient documentation

## 2019-02-25 DIAGNOSIS — M545 Low back pain: Secondary | ICD-10-CM | POA: Insufficient documentation

## 2019-02-25 DIAGNOSIS — Z87891 Personal history of nicotine dependence: Secondary | ICD-10-CM | POA: Diagnosis not present

## 2019-02-25 DIAGNOSIS — I2699 Other pulmonary embolism without acute cor pulmonale: Secondary | ICD-10-CM | POA: Insufficient documentation

## 2019-02-25 DIAGNOSIS — M5441 Lumbago with sciatica, right side: Secondary | ICD-10-CM | POA: Diagnosis not present

## 2019-02-25 DIAGNOSIS — R1031 Right lower quadrant pain: Secondary | ICD-10-CM | POA: Diagnosis present

## 2019-02-25 DIAGNOSIS — R109 Unspecified abdominal pain: Secondary | ICD-10-CM | POA: Diagnosis not present

## 2019-02-25 DIAGNOSIS — Z7982 Long term (current) use of aspirin: Secondary | ICD-10-CM | POA: Insufficient documentation

## 2019-02-25 LAB — URINALYSIS, ROUTINE W REFLEX MICROSCOPIC
Bacteria, UA: NONE SEEN
Bilirubin Urine: NEGATIVE
Glucose, UA: NEGATIVE mg/dL
Hgb urine dipstick: NEGATIVE
Ketones, ur: NEGATIVE mg/dL
Nitrite: NEGATIVE
Protein, ur: NEGATIVE mg/dL
Specific Gravity, Urine: 1.012 (ref 1.005–1.030)
pH: 5 (ref 5.0–8.0)

## 2019-02-25 LAB — BASIC METABOLIC PANEL
Anion gap: 11 (ref 5–15)
BUN: 15 mg/dL (ref 8–23)
CO2: 25 mmol/L (ref 22–32)
Calcium: 9 mg/dL (ref 8.9–10.3)
Chloride: 101 mmol/L (ref 98–111)
Creatinine, Ser: 1.04 mg/dL — ABNORMAL HIGH (ref 0.44–1.00)
GFR calc Af Amer: >60 mL/min (ref >60)
GFR calc non Af Amer: 53 mL/min — ABNORMAL LOW (ref >60)
Glucose, Bld: 106 mg/dL — ABNORMAL HIGH (ref 70–99)
Potassium: 4.4 mmol/L (ref 3.5–5.1)
Sodium: 137 mmol/L (ref 135–145)

## 2019-02-25 LAB — CBC
HCT: 44.7 % (ref 36.0–46.0)
Hemoglobin: 13.9 g/dL (ref 12.0–15.0)
MCH: 29.4 pg (ref 26.0–34.0)
MCHC: 31.1 g/dL (ref 30.0–36.0)
MCV: 94.7 fL (ref 80.0–100.0)
Platelets: 417 10*3/uL — ABNORMAL HIGH (ref 150–400)
RBC: 4.72 MIL/uL (ref 3.87–5.11)
RDW: 13.2 % (ref 11.5–15.5)
WBC: 6.4 10*3/uL (ref 4.0–10.5)
nRBC: 0 % (ref 0.0–0.2)

## 2019-02-25 MED ORDER — MORPHINE SULFATE (PF) 2 MG/ML IV SOLN
2.0000 mg | Freq: Once | INTRAVENOUS | Status: AC
  Administered 2019-02-25: 2 mg via INTRAVENOUS
  Filled 2019-02-25: qty 1

## 2019-02-25 MED ORDER — CYCLOBENZAPRINE HCL 10 MG PO TABS: 10.0000 mg | ORAL_TABLET | Freq: Three times a day (TID) | ORAL | 0 refills | Status: DC | PRN

## 2019-02-25 MED ORDER — SODIUM CHLORIDE 0.9 % IV BOLUS
1000.0000 mL | Freq: Once | INTRAVENOUS | Status: AC
  Administered 2019-02-25: 1000 mL via INTRAVENOUS

## 2019-02-25 MED ORDER — IOHEXOL 350 MG/ML SOLN
100.0000 mL | Freq: Once | INTRAVENOUS | Status: AC | PRN
  Administered 2019-02-25: 16:00:00 100 mL via INTRAVENOUS

## 2019-02-25 MED ORDER — HYDROCODONE-ACETAMINOPHEN 7.5-325 MG PO TABS: 1.0000 | ORAL_TABLET | Freq: Four times a day (QID) | ORAL | 0 refills | Status: DC | PRN

## 2019-02-25 NOTE — ED Provider Notes (Signed)
[facility] EMERGENCY DEPARTMENT Provider Note   CSN: [id number] Arrival date & time: 02/25/19  [id number]     History   Chief Complaint Chief Complaint  Patient presents with   Flank Pain    HPI MELEA CLOER is a 78 y.o. female with history of current left lower extremity DVT on Eliquis with IVC filter in place and planned removal in late October, spontaneous retroperitoneal hemorrhage, COPD, sciatica presents today for right flank pain.  January 13, 2019 patient diagnosed with spontaneous retroperitoneal bleed along with left lower extremity DVT, she subsequently had IVC filter placed.  On January 28, 2027 she was diagnosed with acute pulmonary embolism, afterwards she was discharged on Eliquis.  She has scheduled IVC filter removal for 03/24/2019.  Patient reports right flank pain today feels like a strained muscle but she reports this also feels similar to when she had her spontaneous retroperitoneal hemorrhage last month.  She describes a moderate intensity aching sensation in her right flank that radiates around to her right lower abdomen, has been worsening over the past 2-3 days, worsened with movement and with no clear alleviating factors.  Denies fever/chills, fall/injury, headache, chest pain/shortness of breath, nausea/vomiting, dysuria/hematuria, diarrhea, vaginal bleeding/discharge, numbness/tingling or any additional concerns.    HPI  Past Medical History:  Diagnosis Date   Allergic rhinitis    Asthma    Chronic airway obstruction, not elsewhere classified    Depressive disorder, not elsewhere classified    Esophageal reflux     Patient Active Problem List   Diagnosis Date Noted   DVT, recurrent, lower extremity, chronic, left ([place]) 01/28/2019   Acute pulmonary embolism with acute cor pulmonale ([place]) 01/28/2019   Hypokalemia 01/28/2019   Retroperitoneal bleed 01/13/2019   CAP (community acquired pneumonia) 05/27/2013   SOB (shortness  of breath) 05/27/2013   Myalgia 05/27/2013   COPD with acute exacerbation ([place]) 05/27/2013   Acute respiratory failure with hypoxia ([place]) 05/27/2013   Vertebral compression fracture ([place]) 09/15/2010   SCIATICA 12/16/2007   COPD mixed type ([place]) 07/17/2007   ALLERGIC RHINITIS 07/16/2007   BRONCHITIS 07/16/2007   ASTHMA 07/16/2007   Esophageal reflux 07/16/2007    Past Surgical History:  Procedure Laterality Date   ABDOMINAL HYSTERECTOMY     CYSTOSCOPY     NASAL SINUS SURGERY     VENA CAVA FILTER PLACEMENT N/A 01/13/2019   Procedure: INSERTION VENA-CAVA FILTER;  Surgeon: Waynetta Sandy, MD;  Location: [facility];  Service: Vascular;  Laterality: N/A;     OB History   No obstetric history on file.      Home Medications    Prior to Admission medications   Medication Sig Start Date End Date Taking? Authorizing Provider  albuterol (PROVENTIL) (2.5 MG/3ML) 0.083% nebulizer solution Take 3 mLs (2.5 mg total) by nebulization every 6 (six) hours. 09/24/14   Deneise Lever, MD  albuterol (VENTOLIN HFA) 108 (90 Base) MCG/ACT inhaler Inhale 2 puffs into the lungs every 6 (six) hours as needed for wheezing or shortness of breath. 12/26/18   Baird Lyons D, MD  aspirin EC 81 MG EC tablet Take 1 tablet (81 mg total) by mouth daily. 01/17/19   Bonnielee Haff, MD  cephALEXin (KEFLEX) 250 MG capsule Take 250 mg by mouth daily. 12/04/18   [provider]  cetirizine (ZYRTEC) 10 MG tablet Take 10 mg by mouth as needed for allergies.     [provider]  cyclobenzaprine (FLEXERIL) 10 MG tablet Take 10 mg  by mouth 3 (three) times daily. 01/10/19   [provider]  Eliquis DVT/PE Starter Pack (ELIQUIS STARTER PACK) 5 MG TABS Take as directed on package: start with two-5mg  tablets twice daily for 7 days. On day 8, switch to one-5mg  tablet twice daily. 01/31/19   Darliss Cheney, MD  Fluticasone-Salmeterol (ADVAIR) 250-50 MCG/DOSE AEPB INHALE 1 DOSE BY MOUTH  TWICE DAILY, RINSE MOUTH AFTER USE Patient taking differently: Inhale 1 puff into the lungs 2 (two) times daily.  09/09/18   Deneise Lever, MD  Glycopyrrolate-Formoterol (BEVESPI AEROSPHERE) 9-4.8 MCG/ACT AERO Inhale 2 puffs into the lungs 2 (two) times a day. 12/26/18   Deneise Lever, MD  HYDROcodone-acetaminophen (NORCO) 7.5-325 MG tablet Take 1 tablet by mouth every 6 (six) hours as needed for moderate pain. 01/16/19   Bonnielee Haff, MD  LORazepam (ATIVAN) 1 MG tablet Take 0.5-1 mg by mouth at bedtime.    [provider]  omeprazole (PRILOSEC) 20 MG capsule Take 1 tablet by mouth daily.  09/13/11   [provider]  traZODone (DESYREL) 100 MG tablet Take 100 mg by mouth at bedtime.     [provider]    Family History Family History  Problem Relation Age of Onset   Diabetes Mother    Coronary artery disease Mother    Emphysema Father    Coronary artery disease Brother     Social History Social History   Tobacco Use   Smoking status: Former Smoker    Packs/day: 0.25    Years: 40.00    Pack years: 10.00    Types: Cigarettes    Quit date: 05/27/2013    Years since quitting: 5.7   Smokeless tobacco: Never Used  Substance Use Topics   Alcohol use: No   Drug use: No     Allergies   Clarithromycin, Penicillins, Pneumococcal vaccines, Sulfonamide derivatives, and Estradiol   Review of Systems Review of Systems Ten systems are reviewed and are negative for acute change except as noted in the HPI   Physical Exam Updated Vital Signs BP 131/90 (BP Location: Left Arm)    Pulse 79    Resp 18    SpO2 93%   Physical Exam Constitutional:      General: She is not in acute distress.    Appearance: Normal appearance. She is well-developed. She is not ill-appearing or diaphoretic.  HENT:     Head: Normocephalic and atraumatic.     Right Ear: External ear normal.     Left Ear: External ear normal.     Nose: Nose normal.  Eyes:     General:  Vision grossly intact. Gaze aligned appropriately.     Pupils: Pupils are equal, round, and reactive to light.  Neck:     Musculoskeletal: Normal range of motion.     Trachea: Trachea and phonation normal. No tracheal deviation.  Cardiovascular:     Rate and Rhythm: Normal rate and regular rhythm.     Pulses: Normal pulses.     Heart sounds: Normal heart sounds.     Comments: Left lower extremity swelling patient reports is from known DVT, reports as improving over the last few weeks. Pulmonary:     Effort: Pulmonary effort is normal. No respiratory distress.  Abdominal:     General: There is no distension.     Palpations: Abdomen is soft.     Tenderness: There is no abdominal tenderness. There is no guarding or rebound.  Musculoskeletal: Normal range of motion.  Right lower leg: No edema.     Left lower leg: 1+ Edema present.     Comments: No midline C/T/L spinal tenderness to palpation, no deformity, crepitus, or step-off noted. No sign of injury to the neck or back. - Minimal tenderness of the right paralumbar musculature.   Skin:    General: Skin is warm and dry.  Neurological:     Mental Status: She is alert.     GCS: GCS eye subscore is 4. GCS verbal subscore is 5. GCS motor subscore is 6.     Comments: Speech is clear and goal oriented, follows commands Major Cranial nerves without deficit, no facial droop Moves extremities without ataxia, coordination intact  Psychiatric:        Behavior: Behavior normal.     ED Treatments / Results  Labs (all labs ordered are listed, but only abnormal results are displayed) Labs Reviewed  BASIC METABOLIC PANEL - Abnormal; Notable for the following components:      Result Value   Glucose, Bld 106 (*)    Creatinine, Ser 1.04 (*)    GFR calc non Af Amer 53 (*)    All other components within normal limits  CBC - Abnormal; Notable for the following components:   Platelets 417 (*)    All other components within normal limits    URINALYSIS, ROUTINE W REFLEX MICROSCOPIC    EKG None  Radiology No results found.  Procedures Procedures (including critical care time)  Medications Ordered in ED Medications - No data to display   Initial Impression / Assessment and Plan / ED Course  I have reviewed the triage vital signs and the nursing notes.  Pertinent labs & imaging results that were available during my care of the patient were reviewed by me and considered in my medical decision making (see chart for details).  Clinical Course as of Feb 24 1510  Tue Feb 25, 2019  1309 CT Angio Abdomin and pelvis, Arterial and venous phases.   [BM]    Clinical Course User Index [BM] Deliah Boston, PA-C   Urinalysis with small leukocytes otherwise within normal limits, patient without urinary symptoms, will send for culture, doubt UTI at this time BMP with creatinine 1.04, slightly above baseline will give fluid bolus CBC nonacute - Discussed case with Dr. Laverta Baltimore, will consult radiology for imaging recommendations for concern of retro-peritoneal hemorrhage. - Discussed case with radiologist Dr. Kathlene Cote who recommends CT Angio abdomen pelvis - Patient reassessed resting comfortably no acute distress states understanding care plan is agreeable imaging. - Care handoff given to Carlisle Cater PA-C at shift change. Imaging pending at this time. Plan of care is to follow-up on imaging, possible consult to vascular depending on findings. Disposition per oncoming team.  Note: Portions of this report may have been transcribed using voice recognition software. Every effort was made to ensure accuracy; however, inadvertent computerized transcription errors may still be present. Final Clinical Impressions(s) / ED Diagnoses   Final diagnoses:  None    ED Discharge Orders    None       Gari Crown 02/25/19 1659    LongWonda Olds, MD 02/26/19 [phone]

## 2019-02-25 NOTE — ED Notes (Signed)
Patient verbalizes understanding of discharge instructions. Opportunity for questioning and answers were provided. Armband removed by staff, pt discharged from ED.  

## 2019-02-25 NOTE — ED Notes (Signed)
Patient transported to CT 

## 2019-02-25 NOTE — Discharge Instructions (Addendum)
Your blood work and urine test look good today.  The CT scan does not show any new bleeding.  It shows that the previous bleeding is almost nearly resolved.  We do not see any signs of infection in the abdomen or pelvis.  Your vena cava filter is in the right position without any complications.  This suggests that your symptoms are most likely due to nerve irritation or musculoskeletal pain.  Use the Flexeril at bedtime to help you rest if needed.  Use pain medication as directed.  Follow-up with your doctor in the next week for recheck.

## 2019-02-25 NOTE — ED Triage Notes (Signed)
Pt states 3 days ago she began to have right flank pain and 1 day ago she began to have pain radiate into RLQ. Pt states has was just recently in hospital being treated for left leg blood clot.

## 2019-02-25 NOTE — ED Provider Notes (Signed)
5:22 PM signout from [facility] at shift change.  Patient with ongoing right lower back pain that radiates down into her hip into her leg.  It is worse with movement and positioning.  Seems very radicular in nature.  Patient concerned given her previous diagnosis of spontaneous retroperitoneal bleeding and left lower extremity DVT.  I actually saw her at the time of ED evaluation for this previous admission.  CT angiography of the abdomen and pelvis was ordered.  Fortunately, patient's previous hematoma is nearly entirely resolved.  Patient does not have any other signs of intra-abdominal infection or other issues which would explain her pain.  IVC filter in place without complication.  Labs reviewed and are unremarkable.  We will continue to treat symptoms.  Patient will continue used a heating pad on the area.  She will take Flexeril at bedtime if needed.  Patient given short course of pain medication to use.  Encouraged PCP follow-up within the next 1 week for recheck.  Return with worsening uncontrolled pain, new pain, leg swelling, other concerns.  She verbalizes understanding and agrees with plan.  Patient seems relieved that her symptoms are not caused by no episode of bleeding today given her recent initiation of blood thinners for PE.  Patient counseled on use of narcotic pain medications. Counseled not to combine these medications with others containing tylenol. Urged not to drink alcohol, drive, or perform any other activities that requires focus while taking these medications. The patient verbalizes understanding and agrees with the plan.  Use pain medication only under direct supervision at the lowest possible dose needed to control your pain.    BP 132/80   Pulse 82   Temp 98.9 F (37.2 C) (Oral)   Resp 18   SpO2 92%     Carlisle Cater, PA-C 02/25/19 1725    Daleen Bo, MD 02/25/19 2352

## 2019-02-27 LAB — URINE CULTURE: Culture: 10000 — AB

## 2019-02-28 ENCOUNTER — Telehealth: Payer: Self-pay | Admitting: Emergency Medicine

## 2019-02-28 NOTE — Telephone Encounter (Signed)
Post ED Visit - Positive Culture Follow-up  Culture report reviewed by antimicrobial stewardship pharmacist: [facility] []  Elenor Quinones, Pharm.D. [x]  Heide Guile, Pharm.D., BCPS AQ-ID []  Parks Neptune, Pharm.D., BCPS []  Alycia Rossetti, Pharm.D., BCPS []  [place], [place].D., BCPS, AAHIVP []  Legrand Como, Pharm.D., BCPS, AAHIVP []  Salome Arnt, PharmD, BCPS []  Johnnette Gourd, PharmD, BCPS []  Hughes Better, PharmD, BCPS []  Leeroy Cha, PharmD []  Laqueta Linden, PharmD, BCPS []  Albertina Parr, PharmD  [place] Team []  Leodis Sias, PharmD []  Lindell Spar, PharmD []  Royetta Asal, PharmD []  Graylin Shiver, Rph []  Rema Fendt) Glennon Mac, PharmD []  Arlyn Dunning, PharmD []  Netta Cedars, PharmD []  Dia Sitter, PharmD []  Leone Haven, PharmD []  Gretta Arab, PharmD []  Theodis Shove, PharmD []  Peggyann Juba, PharmD []  Reuel Boom, PharmD   Positive Urine culture No antibiotic needed, no further patient follow-up is required at this time.  Sandi Raveling Aviyah Swetz 02/28/2019, 9:49 AM

## 2019-03-12 NOTE — Progress Notes (Signed)
HEMATOLOGY/ONCOLOGY CONSULTATION NOTE  Date of Service: 03/13/2019  Patient Care Team: Deland Pretty, MD as PCP - General (Internal Medicine)  REFERRING PHYSICIAN: Deland Pretty, MD  CHIEF COMPLAINTS/PURPOSE OF CONSULTATION:  Spontaneous retroperitoneal bleed, DVT, Pulmonary Embolism    HISTORY OF PRESENTING ILLNESS:   [name] is a wonderful 78 y.o. female who has been referred to [place] by Deland Pretty, MD for evaluation and management of Acute Pulmonary Embolism.The pt reports that she is doing well overall.   The pt reports that she was in the emergency room on 8/14 then again on the 8/17. The first time she went to the Emergency room her leg was swollen that Friday by the weekend her leg was even more enlarged. @-3 months before leg swelling there was no other symptoms , no new medications, no long distance travel, no injury she did have surgery 06/04/2017.Left: Findings consistent with acute deep vein thrombosis involving the left common femoral vein, left femoral vein, left proximal profunda vein, left popliteal vein, left posterior tibial veins, left peroneal veins, and left gastrocnemius veins.  Findings consistent with acute superficial vein thrombosis involving the left small saphenous vein. No cystic structure found in the popliteal fossa. [place] LE 8/17 showed Left: Findings consistent with acute deep vein thrombosis involving the left common femoral vein, left femoral vein, left proximal profunda vein, left popliteal vein, left posterior tibial veins, left peroneal veins, and left gastrocnemius veins.  Findings consistent with acute superficial vein thrombosis involving the left small saphenous vein. No cystic structure found in the popliteal fossa.  She was admitted 01/13/2019 with a retroperitoneal bleed and had IVC filter placed and anticoagulation held. Of note prior to the patient's visit today, pt has had CT ANGIOGRAPHY OF ABDOMINAL AORTA WITH ILIOFEMORAL RUNOFF completed  on 01/13/2019 with results revealing "Left-sided retroperitoneal hemorrhage with no evidence of active extravasation. The greatest diameter is estimated 8.3 cm, with the majority of hemorrhage  CT demonstrates left-sided DVT, involving left common iliac vein, internal iliac veins, external iliac vein, common femoral vein, femoral vein, popliteal vein, and possibly tibial veins. Correlation with duplex might be useful."  Of note prior to the patient's visit today, pt has had CT Angio Chest PE W or WO Contrast completed on 01/28/2019 with results revealing "Extensive acute pulmonary emboli involving the lobar and segmental branches of all pulmonary lobes. No main branch or saddle embolism.There is CT evidence of right heart strain."   She is on Eliquis for 6 weeks. -She has an appointment to have the filter removed October 26th  -Pt started oxygen after the discovery of clots in lungs.   Most recent lab results (02/25/2019) of CBC is as follows: all values are WNL except for Glucose at 106, Creatinine Ser at 1.04, GFR non calc Af Amer at 53, Platelets at 417.  On review of systems, pt reports leg swelling, discoloration in left leg and denies previous blood clots and any other symptoms.   On PMHx the pt reports 06/04/2017 Posterior Colporrhaphy  On Social Hx the pt reports previous smoker, hasn't smoked in 15 years    MEDICAL HISTORY:  Past Medical History:  Diagnosis Date   Allergic rhinitis    Asthma    Chronic airway obstruction, not elsewhere classified    Depressive disorder, not elsewhere classified    Esophageal reflux      SURGICAL HISTORY: Past Surgical History:  Procedure Laterality Date   ABDOMINAL HYSTERECTOMY     CYSTOSCOPY  NASAL SINUS SURGERY     VENA CAVA FILTER PLACEMENT N/A 01/13/2019   Procedure: INSERTION VENA-CAVA FILTER;  Surgeon: Waynetta Sandy, MD;  Location: [facility] OR;  Service: Vascular;  Laterality: N/A;     SOCIAL HISTORY: Social  History   Socioeconomic History   Marital status: Divorced    Spouse name: Not on file   Number of children: Not on file   Years of education: Not on file   Highest education level: Not on file  Occupational History   Occupation: Disabled  Social Needs   Financial resource strain: Not on file   Food insecurity    Worry: Not on file    Inability: Not on file   Transportation needs    Medical: Not on file    Non-medical: Not on file  Tobacco Use   Smoking status: Former Smoker    Packs/day: 0.25    Years: 40.00    Pack years: 10.00    Types: Cigarettes    Quit date: 05/27/2013    Years since quitting: 5.7   Smokeless tobacco: Never Used  Substance and Sexual Activity   Alcohol use: No   Drug use: No   Sexual activity: Not on file  Lifestyle   Physical activity    Days per week: Not on file    Minutes per session: Not on file   Stress: Not on file  Relationships   Social connections    Talks on phone: Not on file    Gets together: Not on file    Attends religious service: Not on file    Active member of club or organization: Not on file    Attends meetings of clubs or organizations: Not on file    Relationship status: Not on file   Intimate partner violence    Fear of current or ex partner: Not on file    Emotionally abused: Not on file    Physically abused: Not on file    Forced sexual activity: Not on file  Other Topics Concern   Not on file  Social History Narrative   Not on file     FAMILY HISTORY: Family History  Problem Relation Age of Onset   Diabetes Mother    Coronary artery disease Mother    Emphysema Father    Coronary artery disease Brother      ALLERGIES:   is allergic to clarithromycin; penicillins; pneumococcal vaccines; sulfonamide derivatives; and estradiol.   MEDICATIONS:  Current Outpatient Medications  Medication Sig Dispense Refill   albuterol (PROVENTIL) (2.5 MG/3ML) 0.083% nebulizer solution Take 3  mLs (2.5 mg total) by nebulization every 6 (six) hours. 75 mL 3   albuterol (VENTOLIN HFA) 108 (90 Base) MCG/ACT inhaler Inhale 2 puffs into the lungs every 6 (six) hours as needed for wheezing or shortness of breath. 18 g 12   aspirin EC 81 MG EC tablet Take 1 tablet (81 mg total) by mouth daily. 30 tablet 0   cephALEXin (KEFLEX) 250 MG capsule Take 250 mg by mouth daily.     cetirizine (ZYRTEC) 10 MG tablet Take 10 mg by mouth as needed for allergies.      cyclobenzaprine (FLEXERIL) 10 MG tablet Take 1 tablet (10 mg total) by mouth 3 (three) times daily as needed for muscle spasms. 15 tablet 0   Eliquis DVT/PE Starter Pack (ELIQUIS STARTER PACK) 5 MG TABS Take as directed on package: start with two-5mg  tablets twice daily for 7 days. On day 8, switch to  one-5mg  tablet twice daily. 1 each 0   Fluticasone-Salmeterol (ADVAIR) 250-50 MCG/DOSE AEPB INHALE 1 DOSE BY MOUTH TWICE DAILY, RINSE MOUTH AFTER USE (Patient taking differently: Inhale 1 puff into the lungs 2 (two) times daily. ) 60 each 2   Glycopyrrolate-Formoterol (BEVESPI AEROSPHERE) 9-4.8 MCG/ACT AERO Inhale 2 puffs into the lungs 2 (two) times a day. (Patient not taking: Reported on 02/25/2019) 2 g 0   HYDROcodone-acetaminophen (NORCO) 7.5-325 MG tablet Take 1 tablet by mouth every 6 (six) hours as needed for moderate pain. 8 tablet 0   LORazepam (ATIVAN) 1 MG tablet Take 0.5-1 mg by mouth at bedtime.     omeprazole (PRILOSEC) 20 MG capsule Take 1 tablet by mouth daily.      potassium chloride (KLOR-CON) 8 MEQ tablet Take 8 mEq by mouth daily.     Tiotropium Bromide Monohydrate (SPIRIVA RESPIMAT) 2.5 MCG/ACT AERS Inhale 1 puff into the lungs 2 (two) times daily.     traZODone (DESYREL) 100 MG tablet Take 100 mg by mouth at bedtime.      triamterene-hydrochlorothiazide (MAXZIDE-25) 37.5-25 MG tablet Take 0.5 tablets by mouth daily.     No current facility-administered medications for this visit.      REVIEW OF SYSTEMS:   A  10+ POINT REVIEW OF SYSTEMS WAS OBTAINED including neurology, dermatology, psychiatry, cardiac, respiratory, lymph, extremities, GI, GU, Musculoskeletal, constitutional, breasts, reproductive, HEENT.  All pertinent positives are noted in the HPI.  All others are negative.    PHYSICAL EXAMINATION: ECOG PERFORMANCE STATUS: 2 - Symptomatic, <50% confined to bed  Vitals:   03/13/19 1002  BP: 128/86  Pulse: 97  Resp: 18  Temp: 98 F (36.7 C)  SpO2: 95%   Filed Weights   03/13/19 1002  Weight: 160 lb 6.4 oz (72.8 kg)   Body mass index is 25.89 kg/m.  GENERAL:alert, in no acute distress and comfortable SKIN: no acute rashes, no significant lesions EYES: conjunctiva are pink and non-injected, sclera anicteric OROPHARYNX: MMM, no exudates, no oropharyngeal erythema or ulceration NECK: supple, no JVD LYMPH:  no palpable lymphadenopathy in the cervical, axillary or inguinal regions LUNGS: clear to auscultation b/l with normal respiratory effort HEART: regular rate & rhythm ABDOMEN:  normoactive bowel sounds , non tender, not distended. Extremity: no pedal edema on right side, +2,3 left pedal edema PSYCH: alert & oriented x 3 with fluent speech NEURO: no focal motor/sensory deficits   LABORATORY DATA:  I have reviewed the data as listed  CBC Latest Ref Rng & Units 03/13/2019 02/25/2019 02/02/2019  WBC 4.0 - 10.5 K/uL 7.0 6.4 9.6  Hemoglobin 12.0 - 15.0 g/dL 14.6 13.9 10.3(L)  Hematocrit 36.0 - 46.0 % 46.0 44.7 33.9(L)  Platelets 150 - 400 K/uL 413(H) 417(H) 430(H)    CMP Latest Ref Rng & Units 03/13/2019 02/25/2019 02/02/2019  Glucose 70 - 99 mg/dL 92 106(H) 154(H)  BUN 8 - 23 mg/dL 15 15 15   Creatinine 0.44 - 1.00 mg/dL 1.21(H) 1.04(H) 0.80  Sodium 135 - 145 mmol/L 141 137 138  Potassium 3.5 - 5.1 mmol/L 4.1 4.4 4.3  Chloride 98 - 111 mmol/L 103 101 104  CO2 22 - 32 mmol/L 29 25 26   Calcium 8.9 - 10.3 mg/dL 9.2 9.0 8.7(L)  Total Protein 6.5 - 8.1 g/dL 7.4 - -  Total Bilirubin  0.3 - 1.2 mg/dL 0.6 - -  Alkaline Phos 38 - 126 U/L 65 - -  AST 15 - 41 U/L 20 - -  ALT 0 - 44  U/L 14 - -   01/28/2019: ([id number]) CT Angio Chest PE W or WO Contrast  01/13/2019:([phone])  CT ANGIOGRAPHY OF ABDOMINAL AORTA WITH ILIOFEMORAL RUNOFF   RADIOGRAPHIC STUDIES: I have personally reviewed the radiological images as listed and agreed with the findings in the report.  Ct Angio Abd/pel W And/or Wo Contrast  Result Date: 02/25/2019 CLINICAL DATA:  77 year old female with possible retroperitoneal hemorrhage and abdominal pain EXAM: CTA ABDOMEN AND PELVIS wITHOUT AND WITH CONTRAST TECHNIQUE: Multidetector CT imaging of the abdomen and pelvis was performed using the standard protocol during bolus administration of intravenous contrast. Multiplanar reconstructed images and MIPs were obtained and reviewed to evaluate the vascular anatomy. CONTRAST:  187mL OMNIPAQUE IOHEXOL 350 MG/ML SOLN COMPARISON:  None. FINDINGS: VASCULAR Aorta: Atherosclerotic changes of the lower thoracic and the abdominal aorta. No aneurysm or dissection. Celiac: Mild atherosclerotic changes at the celiac artery origin. Celiac artery patent. SMA: SMA patent with atherosclerotic changes. Renals: Bilateral renal arteries are patent with mild atherosclerotic changes. IMA: Inferior mesenteric artery is patent. Right lower extremity: Mild to moderate atherosclerotic changes of the right iliac artery. No aneurysm or dissection. Hypogastric arteries patent, ectatic at the origin measuring 12 mm. External iliac artery patent. Common femoral artery patent. High bifurcation on the right. Proximal profunda femoris and SFA patent. Left lower extremity: Mild to moderate atherosclerotic changes of the left iliac system. Hypogastric arteries patent. External iliac artery patent. Common femoral artery is patent. High bifurcation of the left common femoral artery. Proximal profunda femoris and SFA patent. Veins: IVC filter in position.  Developing pelvic collateral veins in the setting of prior left DVT. Review of the MIP images confirms the above findings. NON-VASCULAR Lower chest: Paraseptal and centrilobular emphysema at the bilateral lung bases with scarring. No acute finding. Hepatobiliary: Focal fatty sparing at the false form ligament. Unremarkable gall bladder. Pancreas: Unremarkable. Spleen: Unremarkable. Adrenals/Urinary Tract: Unremarkable appearance of adrenal glands. Right: No hydronephrosis. Symmetric perfusion to the left. No nephrolithiasis. Unremarkable course of the right ureter. Left: Low-density cyst on the anterior aspect of the left kidney. No other lesion identified within the left kidney. No hydronephrosis or nephrolithiasis. The kidney is more anatomically position after near complete resolution of the hematoma. Unremarkable appearance of the urinary bladder . Stomach/Bowel: Hiatal hernia. Unremarkable appearance of the stomach. Unremarkable appearance of small bowel. No evidence of obstruction. Appendix is not visualized, however, no inflammatory changes are present adjacent to the cecum to indicate an appendicitis. Colonic diverticular disease without evidence of acute inflammation. Lymphatic: No adenopathy. Mesenteric: The retroperitoneal hematoma has nearly completely resolved. There is residual soft tissue/hematoma overlying the most superior aspect of the psoas muscle measuring 3.2 cm on image 52 of series 5. This is not well evaluated by CT angiogram though presumed to represent residual resolving hematoma. Reproductive: Hysterectomy.  Unremarkable appearance of the ovaries. Other: Small fat containing umbilical hernia. Musculoskeletal: No acute displaced fracture. Degenerative changes of the thoracolumbar spine. IMPRESSION: No acute CT finding. There has been near complete resolution of the previous left-sided retroperitoneal hematoma. The current CT demonstrates no evidence of prior source, which was presumed again  to be spontaneous. IVC filter in position. Developing pelvic venous collaterals in the setting of left DVT. Emphysematous changes at the lung bases. The nodularity at the left lung base is relatively unchanged, potentially representing chronic scarring/inflammatory changes. As previously noted, a 1 year follow-up chest CT from the prior August study is recommended. Aortic Atherosclerosis (ICD10-I70.0). Signed, Dulcy Fanny. Dellia Nims, [place] [facility] Radiology Electronically Signed   By: Corrie Mckusick D.O.   On: 02/25/2019 16:59     ASSESSMENT & PLAN:   [name] is a 77 y.o. female with:  1. Left lower etremity DVT 2. Extensive bilateral PE 3. Retroperitoneal bleed - spontaneous 4. S/p IVC filter  PLAN: -Discussed patient's most recent labs from 02/25/2019,  all values are WNL except for Glucose at 106, Creatinine Ser at 1.04, GFR non calc Af Amer at 53, Platelets at 417. -Discussed the discoloration of legs, caused by bloods inability to flow thorugh legs -Advised that if the cause of th blood clot is unknown that she will have to continue blood thinners life long for unprovoked extensive VTE. -. Discussed that surgery that was performed 06/04/2017 should not have caused the blood clots. -Discussed that lung clots are very extensive  -Advised to continue blood thinners for at least one year but recommended long term use. Discussed risks and chances of clots reoccurring without blood thinners. -monitor for recurrent bleeding on blood thinners. -Recommended using compression socks -Discussed possible Post thrombotic syndrome due to the delay of starting blood thinners -Suggested blood test today to try to determine cause - hyprecoag workup -Recommended to follow up wi.th Dr. Shelia Media for further cancer screenings  -pt wants to have blood test today and phone visit to review results, followed by following up with Dr. Shelia Media -Repeat ultrasound in  4-5 months along with follow up  [place] today Phone visit with Dr Irene Limbo in 2 weeks  All of the patients questions were answered with apparent satisfaction. The patient knows to call the clinic with any problems, questions or concerns.  I spent 45 minutes counseling the patient face to face. The total time spent in the appointment was 60 minutes and more than 50% was on counseling and direct patient cares.    Sullivan Lone MD MS AAHIVMS [facility] Hematology/Oncology Physician [facility]  (Office):       [phone] (Work cell):  [phone] (Fax):           [phone]  03/12/2019 8:25 PM  I, Scot Dock, am acting as a scribe for Dr. Sullivan Lone.   .I have reviewed the above documentation for accuracy and completeness, and I agree with the above. Brunetta Genera MD

## 2019-03-13 ENCOUNTER — Other Ambulatory Visit: Payer: Self-pay

## 2019-03-13 ENCOUNTER — Inpatient Hospital Stay: Payer: PPO | Attending: Hematology | Admitting: Hematology

## 2019-03-13 ENCOUNTER — Inpatient Hospital Stay: Payer: PPO

## 2019-03-13 VITALS — BP 128/86 | HR 97 | Temp 98.0°F | Resp 18 | Ht 66.0 in | Wt 160.4 lb

## 2019-03-13 DIAGNOSIS — I82422 Acute embolism and thrombosis of left iliac vein: Secondary | ICD-10-CM | POA: Diagnosis not present

## 2019-03-13 DIAGNOSIS — M7989 Other specified soft tissue disorders: Secondary | ICD-10-CM | POA: Insufficient documentation

## 2019-03-13 DIAGNOSIS — I2699 Other pulmonary embolism without acute cor pulmonale: Secondary | ICD-10-CM | POA: Diagnosis not present

## 2019-03-13 DIAGNOSIS — I2609 Other pulmonary embolism with acute cor pulmonale: Secondary | ICD-10-CM

## 2019-03-13 DIAGNOSIS — R58 Hemorrhage, not elsewhere classified: Secondary | ICD-10-CM | POA: Diagnosis not present

## 2019-03-13 DIAGNOSIS — I82412 Acute embolism and thrombosis of left femoral vein: Secondary | ICD-10-CM

## 2019-03-13 DIAGNOSIS — Z7901 Long term (current) use of anticoagulants: Secondary | ICD-10-CM | POA: Diagnosis not present

## 2019-03-13 DIAGNOSIS — Z95828 Presence of other vascular implants and grafts: Secondary | ICD-10-CM

## 2019-03-13 LAB — CMP (CANCER CENTER ONLY)
ALT: 14 U/L (ref 0–44)
AST: 20 U/L (ref 15–41)
Albumin: 4.3 g/dL (ref 3.5–5.0)
Alkaline Phosphatase: 65 U/L (ref 38–126)
Anion gap: 9 (ref 5–15)
BUN: 15 mg/dL (ref 8–23)
CO2: 29 mmol/L (ref 22–32)
Calcium: 9.2 mg/dL (ref 8.9–10.3)
Chloride: 103 mmol/L (ref 98–111)
Creatinine: 1.21 mg/dL — ABNORMAL HIGH (ref 0.44–1.00)
GFR, Est AFR Am: 51 mL/min — ABNORMAL LOW
GFR, Estimated: 44 mL/min — ABNORMAL LOW
Glucose, Bld: 92 mg/dL (ref 70–99)
Potassium: 4.1 mmol/L (ref 3.5–5.1)
Sodium: 141 mmol/L (ref 135–145)
Total Bilirubin: 0.6 mg/dL (ref 0.3–1.2)
Total Protein: 7.4 g/dL (ref 6.5–8.1)

## 2019-03-13 LAB — CBC WITH DIFFERENTIAL/PLATELET
Abs Immature Granulocytes: 0.02 10*3/uL (ref 0.00–0.07)
Basophils Absolute: 0.1 10*3/uL (ref 0.0–0.1)
Basophils Relative: 1 %
Eosinophils Absolute: 0.1 10*3/uL (ref 0.0–0.5)
Eosinophils Relative: 1 %
HCT: 46 % (ref 36.0–46.0)
Hemoglobin: 14.6 g/dL (ref 12.0–15.0)
Immature Granulocytes: 0 %
Lymphocytes Relative: 27 %
Lymphs Abs: 1.9 10*3/uL (ref 0.7–4.0)
MCH: 29.3 pg (ref 26.0–34.0)
MCHC: 31.7 g/dL (ref 30.0–36.0)
MCV: 92.4 fL (ref 80.0–100.0)
Monocytes Absolute: 0.6 10*3/uL (ref 0.1–1.0)
Monocytes Relative: 9 %
Neutro Abs: 4.2 10*3/uL (ref 1.7–7.7)
Neutrophils Relative %: 62 %
Platelets: 413 10*3/uL — ABNORMAL HIGH (ref 150–400)
RBC: 4.98 MIL/uL (ref 3.87–5.11)
RDW: 13.1 % (ref 11.5–15.5)
WBC: 7 10*3/uL (ref 4.0–10.5)
nRBC: 0 % (ref 0.0–0.2)

## 2019-03-13 LAB — ANTITHROMBIN III: AntiThromb III Func: 103 % (ref 75–120)

## 2019-03-13 NOTE — Patient Instructions (Signed)
Thank you for choosing [facility] to provide your oncology and hematology care.   Should you have questions after your visit to the [facility] ([facility]), please contact this office at [phone] between 8:30 AM and 4:30 PM. Voicemails left after 4:00 PM may not be returned until the following business day. Calls received after 4:30 PM will be answered by an off-site Nurse Triage Line.    Prescription Refills:  Please have your pharmacy contact us directly for most prescription requests.  Contact the office directly for refills of narcotics (pain medications). Allow 48-72 hours for refills.  Appointments: Please contact the [facility] scheduling department [phone] for questions regarding [facility] appointment scheduling.  Contact the schedulers with any scheduling changes so that your appointment can be rescheduled in a timely manner.   Central Scheduling for [facility] [phone] - Call to schedule procedures such as PET scans, CT scans, MRI, Ultrasound, etc.  To afford each patient quality time with our providers, please arrive 30 minutes before your scheduled appointment time.  If you arrive late for your appointment, you may be asked to reschedule.  We strive to give you quality time with our providers, and arriving late affects you and other patients whose appointments are after yours. If you are a no show for multiple scheduled visits, you may be dismissed from the clinic at the providers discretion.     Resources: [facility] Social Workers [phone] for additional information on assistance programs --Anne Cunningham/Abigail Elmore  [place] DSS  [phone]: Information regarding food stamps, Medicaid, and utility assistance [place] [phone]   [place] [employer]'s shared-ride transportation service for eligible riders who have a disability that prevents them from riding the fixed route bus.   Medicare Rights Center 800-333-4114 Helps people with  Medicare understand their rights and benefits, navigate the Medicare system, and secure the quality healthcare they deserve [employer] 800-227-2345 Assists patients locate various types of support and financial assistance Cancer Care: 1-800-813-HOPE (4673) Provides financial assistance, online support groups, medication/co-pay assistance.      

## 2019-03-14 LAB — HOMOCYSTEINE: Homocysteine: 47.5 umol/L — ABNORMAL HIGH (ref 0.0–19.2)

## 2019-03-15 LAB — PROTEIN S ACTIVITY: Protein S Activity: 116 % (ref 63–140)

## 2019-03-15 LAB — PROTEIN C ACTIVITY: Protein C Activity: 134 % (ref 73–180)

## 2019-03-15 LAB — PROTEIN C, TOTAL: Protein C, Total: 89 % (ref 60–150)

## 2019-03-15 LAB — PROTEIN S, TOTAL: Protein S Ag, Total: 118 % (ref 60–150)

## 2019-03-16 LAB — LUPUS ANTICOAGULANT PANEL
DRVVT: 79.8 s — ABNORMAL HIGH (ref 0.0–47.0)
PTT Lupus Anticoagulant: 38.4 s (ref 0.0–51.9)

## 2019-03-16 LAB — CARDIOLIPIN ANTIBODIES, IGG, IGM, IGA
Anticardiolipin IgA: <9 APL U/mL (ref 0–11)
Anticardiolipin IgG: <9 GPL U/mL (ref 0–14)
Anticardiolipin IgM: <9 MPL U/mL (ref 0–12)

## 2019-03-16 LAB — DRVVT CONFIRM: dRVVT Confirm: 1.2 ratio (ref 0.8–1.2)

## 2019-03-16 LAB — DRVVT MIX: dRVVT Mix: 54.2 s — ABNORMAL HIGH (ref 0.0–47.0)

## 2019-03-17 LAB — BETA-2-GLYCOPROTEIN I ABS, IGG/M/A
Beta-2 Glyco I IgG: <9 GPI IgG units (ref 0–20)
Beta-2-Glycoprotein I IgA: <9 GPI IgA units (ref 0–25)
Beta-2-Glycoprotein I IgM: <9 GPI IgM units (ref 0–32)

## 2019-03-18 LAB — PROTHROMBIN GENE MUTATION

## 2019-03-19 LAB — FACTOR 5 LEIDEN

## 2019-03-21 ENCOUNTER — Other Ambulatory Visit (HOSPITAL_COMMUNITY)
Admission: RE | Admit: 2019-03-21 | Discharge: 2019-03-21 | Disposition: A | Discharge status: Home / Self Care | Payer: PPO | Source: Ambulatory Visit | Attending: Vascular Surgery | Admitting: Vascular Surgery

## 2019-03-21 DIAGNOSIS — Z20828 Contact with and (suspected) exposure to other viral communicable diseases: Secondary | ICD-10-CM | POA: Diagnosis not present

## 2019-03-21 DIAGNOSIS — Z01812 Encounter for preprocedural laboratory examination: Secondary | ICD-10-CM | POA: Diagnosis not present

## 2019-03-22 LAB — NOVEL CORONAVIRUS, NAA (HOSP ORDER, SEND-OUT TO REF LAB; TAT 18-24 HRS): SARS-CoV-2, NAA: NOT DETECTED

## 2019-03-24 ENCOUNTER — Encounter (HOSPITAL_COMMUNITY): Payer: Self-pay | Admitting: Vascular Surgery

## 2019-03-24 ENCOUNTER — Other Ambulatory Visit: Payer: Self-pay

## 2019-03-24 ENCOUNTER — Ambulatory Visit (HOSPITAL_COMMUNITY)
Admission: RE | Admit: 2019-03-24 | Discharge: 2019-03-24 | Disposition: A | Discharge status: Home / Self Care | Payer: PPO | Attending: Vascular Surgery | Admitting: Vascular Surgery

## 2019-03-24 ENCOUNTER — Encounter (HOSPITAL_COMMUNITY)
Admission: RE | Disposition: A | Discharge status: Home / Self Care | Payer: Self-pay | Source: Home / Self Care | Attending: Vascular Surgery

## 2019-03-24 DIAGNOSIS — J449 Chronic obstructive pulmonary disease, unspecified: Secondary | ICD-10-CM | POA: Insufficient documentation

## 2019-03-24 DIAGNOSIS — Z7982 Long term (current) use of aspirin: Secondary | ICD-10-CM | POA: Insufficient documentation

## 2019-03-24 DIAGNOSIS — Z7901 Long term (current) use of anticoagulants: Secondary | ICD-10-CM | POA: Insufficient documentation

## 2019-03-24 DIAGNOSIS — Z87891 Personal history of nicotine dependence: Secondary | ICD-10-CM | POA: Diagnosis not present

## 2019-03-24 DIAGNOSIS — Z452 Encounter for adjustment and management of vascular access device: Secondary | ICD-10-CM | POA: Insufficient documentation

## 2019-03-24 DIAGNOSIS — I82402 Acute embolism and thrombosis of unspecified deep veins of left lower extremity: Secondary | ICD-10-CM | POA: Diagnosis not present

## 2019-03-24 DIAGNOSIS — Z86718 Personal history of other venous thrombosis and embolism: Secondary | ICD-10-CM | POA: Insufficient documentation

## 2019-03-24 DIAGNOSIS — Z79899 Other long term (current) drug therapy: Secondary | ICD-10-CM | POA: Diagnosis not present

## 2019-03-24 HISTORY — PX: IVC FILTER REMOVAL: CATH118246

## 2019-03-24 SURGERY — IVC FILTER REMOVAL
Anesthesia: LOCAL

## 2019-03-24 MED ORDER — MIDAZOLAM HCL 2 MG/2ML IJ SOLN
INTRAMUSCULAR | Status: AC
  Filled 2019-03-24: qty 2

## 2019-03-24 MED ORDER — SODIUM CHLORIDE 0.9 % IV SOLN
INTRAVENOUS | Status: DC
  Administered 2019-03-24: 06:00:00 via INTRAVENOUS

## 2019-03-24 MED ORDER — SODIUM CHLORIDE 0.9% FLUSH: 3.0000 mL | INTRAVENOUS | Status: DC | PRN

## 2019-03-24 MED ORDER — SODIUM CHLORIDE 0.9 % WEIGHT BASED INFUSION: 1.0000 mL/kg/h | INTRAVENOUS | Status: DC

## 2019-03-24 MED ORDER — SODIUM CHLORIDE 0.9% FLUSH: 3.0000 mL | Freq: Two times a day (BID) | INTRAVENOUS | Status: DC

## 2019-03-24 MED ORDER — LIDOCAINE HCL (PF) 1 % IJ SOLN
INTRAMUSCULAR | Status: AC
  Filled 2019-03-24: qty 30

## 2019-03-24 MED ORDER — HEPARIN SODIUM (PORCINE) 1000 UNIT/ML IJ SOLN
INTRAMUSCULAR | Status: DC | PRN
  Administered 2019-03-24: 3000 [IU] via INTRAVENOUS

## 2019-03-24 MED ORDER — MORPHINE SULFATE (PF) 2 MG/ML IV SOLN: 2.0000 mg | INTRAVENOUS | Status: DC | PRN

## 2019-03-24 MED ORDER — FENTANYL CITRATE (PF) 100 MCG/2ML IJ SOLN
INTRAMUSCULAR | Status: AC
  Filled 2019-03-24: qty 2

## 2019-03-24 MED ORDER — HEPARIN (PORCINE) IN NACL 1000-0.9 UT/500ML-% IV SOLN
INTRAVENOUS | Status: AC
  Filled 2019-03-24: qty 500

## 2019-03-24 MED ORDER — IODIXANOL 320 MG/ML IV SOLN
INTRAVENOUS | Status: DC | PRN
  Administered 2019-03-24: 10:00:00 4 mL via INTRAVENOUS

## 2019-03-24 MED ORDER — HYDRALAZINE HCL 20 MG/ML IJ SOLN: 5.0000 mg | INTRAMUSCULAR | Status: DC | PRN

## 2019-03-24 MED ORDER — ACETAMINOPHEN 325 MG PO TABS: 650.0000 mg | ORAL_TABLET | ORAL | Status: DC | PRN

## 2019-03-24 MED ORDER — HEPARIN (PORCINE) IN NACL 1000-0.9 UT/500ML-% IV SOLN
INTRAVENOUS | Status: DC | PRN
  Administered 2019-03-24: 500 mL

## 2019-03-24 MED ORDER — FENTANYL CITRATE (PF) 100 MCG/2ML IJ SOLN
INTRAMUSCULAR | Status: DC | PRN
  Administered 2019-03-24 (×3): 25 ug via INTRAVENOUS
  Administered 2019-03-24 (×2): 50 ug via INTRAVENOUS
  Administered 2019-03-24: 25 ug via INTRAVENOUS
  Administered 2019-03-24: 50 ug via INTRAVENOUS

## 2019-03-24 MED ORDER — LABETALOL HCL 5 MG/ML IV SOLN: 10.0000 mg | INTRAVENOUS | Status: DC | PRN

## 2019-03-24 MED ORDER — OXYCODONE HCL 5 MG PO TABS
ORAL_TABLET | ORAL | Status: AC
  Filled 2019-03-24: qty 1

## 2019-03-24 MED ORDER — OXYCODONE HCL 5 MG PO TABS
5.0000 mg | ORAL_TABLET | ORAL | Status: DC | PRN
  Administered 2019-03-24: 5 mg via ORAL

## 2019-03-24 MED ORDER — ONDANSETRON HCL 4 MG/2ML IJ SOLN: 4.0000 mg | Freq: Four times a day (QID) | INTRAMUSCULAR | Status: DC | PRN

## 2019-03-24 MED ORDER — LIDOCAINE HCL (PF) 1 % IJ SOLN
INTRAMUSCULAR | Status: DC | PRN
  Administered 2019-03-24: 40 mL

## 2019-03-24 MED ORDER — SODIUM CHLORIDE 0.9 % IV SOLN: 250.0000 mL | INTRAVENOUS | Status: DC | PRN

## 2019-03-24 MED ORDER — MIDAZOLAM HCL 2 MG/2ML IJ SOLN
INTRAMUSCULAR | Status: DC | PRN
  Administered 2019-03-24 (×5): 1 mg via INTRAVENOUS

## 2019-03-24 SURGICAL SUPPLY — 26 items
BALLN MUSTANG 12X20X75 (BALLOONS) ×2
BALLN MUSTANG 8X20X75 (BALLOONS) ×2
BALLOON MUSTANG 12X20X75 (BALLOONS) IMPLANT
BALLOON MUSTANG 8X20X75 (BALLOONS) IMPLANT
CATH ANGIO 5F BER2 100CM (CATHETERS) ×1 IMPLANT
CATH OMNI FLUSH 5F 65CM (CATHETERS) ×1 IMPLANT
CATH SOFTOUCH MOTARJEME 5F (CATHETERS) ×1 IMPLANT
CATH THROMBEC 7F 135 CLEANER15 (MISCELLANEOUS) ×1 IMPLANT
CATH VISTA GUIDE 6FR MPA1 (CATHETERS) ×1 IMPLANT
DEVICE ENSNARE  12MMX20MM (VASCULAR PRODUCTS) ×1
DEVICE ENSNARE 12MMX20MM (VASCULAR PRODUCTS) IMPLANT
DEVICE ONE SNARE 15MM (VASCULAR PRODUCTS) ×1 IMPLANT
DEVICE ONE SNARE 25MM (VASCULAR PRODUCTS) ×1 IMPLANT
DEVICE TORQUE .025-.038 (MISCELLANEOUS) ×2 IMPLANT
DRYSEAL FLEXSHEATH 18FR 33CM (SHEATH) ×1
GUIDEWIRE ANGLED .035X260CM (WIRE) ×2 IMPLANT
KIT ENCORE 26 ADVANTAGE (KITS) ×1 IMPLANT
KIT MICROPUNCTURE NIT STIFF (SHEATH) ×1 IMPLANT
KIT PV (KITS) ×2 IMPLANT
SHEATH DESTINATION 8F 45CM (SHEATH) ×1 IMPLANT
SHEATH DRYSEAL FLEX 18FR 33CM (SHEATH) IMPLANT
SHEATH PROBE COVER 6X72 (BAG) ×1 IMPLANT
TRANSDUCER W/STOPCOCK (MISCELLANEOUS) ×2 IMPLANT
TRAY PV CATH (CUSTOM PROCEDURE TRAY) ×2 IMPLANT
WIRE BENTSON .035X145CM (WIRE) ×1 IMPLANT
WIRE ROSEN-J .035X180CM (WIRE) ×1 IMPLANT

## 2019-03-24 NOTE — Op Note (Signed)
Patient name: [name] MRN: [id number] DOB: 1940-08-18 Sex: female  03/24/2019 Pre-operative Diagnosis: ivc filter in place Post-operative diagnosis:  Same Surgeon:  Eda Paschal. Donzetta Matters, MD Procedure Performed: 1.  US guided cannulation of right internal jugular vein 2.  Central venogram 3.  Removal of Cook select IVC filter 4.  Moderate sedation with fentanyl and Versed for 138 minutes  Indications: 78 year old female had a history of retroperitoneal hematoma as well left lower extremity extensive DVT.  She does have some persistent swelling in her left leg.  Filter is now indicated for removal as patient is tolerating Eliquis.  Findings: Filter was thoroughly incorporated particularly at the hub at the level of the right renal vein.  We made multiple attempts at ballooning the filter as well as using the cleaner wire.  Ultimately we snared a Glidewire around the filter and were able to remove it through an 18 French sheath and it was noted to be intact.  No completion venogram was performed as the sheath was removed.  Patient was doing well at completion.   Procedure:  The patient was identified in the holding area and taken to room 8.  The patient was then placed supine on the table and prepped and draped in the usual sterile fashion.  A time out was called.  Ultrasound was used to evaluate the right internal jugular vein.  Moderate sedation was initiated.  Right internal jugular vein was cannulated with direct ultrasound visualization micropuncture needle followed wire and sheath were placed.  Images saved the permanent record.  We placed a Bentson wire under fluoroscopic guidance into the IVC.  We then exchanged for a long 8 [place] sheath.  We tried multiple different snares to attempt to snare the wire.  We performed central venogram it appeared that it was at the level of the right renal vein.  I then attempted to place some balloons past that both an 8 and 12 mm balloon and pull up on  the filter but were unable to get these to initiate.  At one point I was able to snare one of the legs and moved to the filter cephalad but I was not going to be able to pull it into the sheath.  We then exchanged for a cleaner wire.  3000 units of heparin was given wheeze cleaner wire to attempt to clean out the thrombus.  I still could not snare the filter.  I then exchanged over a Rosen wire for a long 18 [place] sheath.  I first use Omni catheter to attempt to go around the filter and snare Glidewire but this was in the wrong plane.  I then used the targeting catheter was able to get around the neck of the filter snared a Glidewire.  I then pulled the filter back.  Unfortunately would not go into the sheath.  Was able to straighten it out significantly.  I pulled it under direct fluoroscopic visualization up through the right atrium into the neck.  At the level the neck I was able to pull harder get it into the sheath.  At this time that sheath also came out.  All wires and catheters were out.  Pressure was held for approximately 10 minutes and hemostasis obtained.  We expected the filter was noted to be all intact although quite mangled.  I placed Dermabond at the level of skin.  She was hemodynamically stable out of pain at the completion of the procedure.  Radiation time for this  procedure was 70 minutes I have discussed with the patient's family and will follow up her skin condition in a few weeks.  Contrast: 4 cc  Arva Slaugh C. Donzetta Matters, MD [facility] [place] Office: [phone] Pager: [phone]

## 2019-03-24 NOTE — H&P (Signed)
CC: Follow up spontaneous retroperitoneal bleed and left LE DVT, +PE, S/P IVC filter placement   History of Present Illness  [name] is a 78 y.o. (1941/02/05) female who is s/p : 1.Ultrasound-guided cannulation right common femoral vein 2.IVC venogram 3.Placement of Cookcelect filter in infrarenal position On 01-13-19 by [name].  Pt had presented with what appeared to be spontaneous left retroperitoneal hematoma. At the same time she had swelling of her left lower extremity, found to have extensive left lower extremity DVT. Given that she has contraindication to anticoagulation at that time, she was indicated for IVC filter placement.  She returns today for post op follow up.  She takes a daily 81 mg ASA and Eliquis.   She states that her left leg swelling has mostly resolved. She states that her PCP put her on a low dose diuretic, and the left leg swelling decreased.   She states that she has mild dyspnea on exertion, that her  Breathing has been a bit worse since the new DVT. She states that she does have COPD. She quit smoking about 2005.        Past Medical History:  Diagnosis Date  . Allergic rhinitis   . Asthma   . Chronic airway obstruction, not elsewhere classified   . Depressive disorder, not elsewhere classified   . Esophageal reflux     Social History Social History        Tobacco Use  . Smoking status: Former Smoker    Packs/day: 0.25    Years: 40.00    Pack years: 10.00    Types: Cigarettes    Quit date: 05/27/2013    Years since quitting: 5.7  . Smokeless tobacco: Never Used  Substance Use Topics  . Alcohol use: No  . Drug use: No    Family History      Family History  Problem Relation Age of Onset  . Diabetes Mother   . Coronary artery disease Mother   . Emphysema Father   . Coronary artery disease Brother     Surgical History      Past Surgical History:  Procedure Laterality Date  .  ABDOMINAL HYSTERECTOMY    . CYSTOSCOPY    . NASAL SINUS SURGERY    . VENA CAVA FILTER PLACEMENT N/A 01/13/2019   Procedure: INSERTION VENA-CAVA FILTER;  Surgeon: Waynetta Sandy, MD;  Location: [facility];  Service: Vascular;  Laterality: N/A;         Allergies  Allergen Reactions  . Clarithromycin Swelling  . Penicillins Swelling    Tongue swelling Did it involve swelling of the face/tongue/throat, SOB, or low BP? Yes Did it involve sudden or severe rash/hives, skin peeling, or any reaction on the inside of your mouth or nose? Yes Did you need to seek medical attention at a hospital or doctor's office? Yes When did it last happen?childhood If all above answers are "NO", may proceed with cephalosporin use.   . Pneumococcal Vaccines Other (See Comments)    Couldn't breathe  . Sulfonamide Derivatives Swelling  . Estradiol Rash    Cream gives her a rash          Current Outpatient Medications  Medication Sig Dispense Refill  . albuterol (PROVENTIL) (2.5 MG/3ML) 0.083% nebulizer solution Take 3 mLs (2.5 mg total) by nebulization every 6 (six) hours. 75 mL 3  . albuterol (VENTOLIN HFA) 108 (90 Base) MCG/ACT inhaler Inhale 2 puffs into the lungs every 6 (six) hours as  needed for wheezing or shortness of breath. 18 g 12  . aspirin EC 81 MG EC tablet Take 1 tablet (81 mg total) by mouth daily. 30 tablet 0  . cephALEXin (KEFLEX) 250 MG capsule Take 250 mg by mouth daily.    . cetirizine (ZYRTEC) 10 MG tablet Take 10 mg by mouth as needed for allergies.     . cyclobenzaprine (FLEXERIL) 10 MG tablet Take 10 mg by mouth 3 (three) times daily.    . Eliquis DVT/PE Starter Pack (ELIQUIS STARTER PACK) 5 MG TABS Take as directed on package: start with two-5mg  tablets twice daily for 7 days. On day 8, switch to one-5mg  tablet twice daily. 1 each 0  . Fluticasone-Salmeterol (ADVAIR) 250-50 MCG/DOSE AEPB INHALE 1 DOSE BY MOUTH TWICE DAILY, RINSE MOUTH AFTER USE (Patient  taking differently: Inhale 1 puff into the lungs 2 (two) times daily. ) 60 each 2  . Glycopyrrolate-Formoterol (BEVESPI AEROSPHERE) 9-4.8 MCG/ACT AERO Inhale 2 puffs into the lungs 2 (two) times a day. 2 g 0  . HYDROcodone-acetaminophen (NORCO) 7.5-325 MG tablet Take 1 tablet by mouth every 6 (six) hours as needed for moderate pain. 20 tablet 0  . LORazepam (ATIVAN) 1 MG tablet Take 0.5-1 mg by mouth at bedtime.    Marland Kitchen omeprazole (PRILOSEC) 20 MG capsule Take 1 tablet by mouth daily.     . traZODone (DESYREL) 100 MG tablet Take 100 mg by mouth at bedtime.      No current facility-administered medications for this visit.     On ROS today: See HPI for pertinent positives and negatives.  Physical Examination     Vitals:   02/14/19 1201  BP: (!) 146/91  Pulse: 98  Resp: 14  Temp: 97.9 F (36.6 C)  TempSrc: Temporal  SpO2: 98%  Weight: 159 lb (72.1 kg)  Height: 5\' 6"  (1.676 m)   Body mass index is 25.66 kg/m.    General: A&O x 3, WD female in NAD HEENT: Grossly intact and WNL.  Pulmonary: Sym exp, respirations are non labored, limited air movement in all fields, no rales or rhonchi, + few expiratory wheezes Cardiac: Regular rate and rhythm, no detected murmur.     Vascular: Vessel Right Left  Radial Palpable Palpable  Carotid  without bruit  without bruit  Aorta Not palpable N/A  Popliteal  palpable Not palpable  PT Palpable Not Palpable  DP Palpable Palpable   Gastrointestinal: soft, NTND, -G/R, - HSM, - masses palpated, - CVAT B. Musculoskeletal: M/S 5/5 throughout, Extremities without ischemic changes. Left leg is slightly larger than right leg, see photo above, 1+ pitting edema in left ankle.  Skin: No rash, no cellulitis, no ulcers noted.  Neurologic: Pain and light touch intact in extremities  Motor exam as listed above. Psychiatric: Normal thought content, somewhat anxious, mood appropriate to clinical situation.   CTA chest (01-28-19):  Extensive acute pulmonary emboli involving the lobar and segmental branches of all pulmonary lobes. No main branch or saddle embolism. There is CT evidence of right heart strain.    A/P  [name] is a 78 y.o. female who is s/p placement of IVC filter on 01-13-19 by [name].  Pt had presented with what appeared to be spontaneous left retroperitoneal hematoma. At the same time she had swelling of her left lower extremity, found to have extensive left lower extremity DVT.   Plan for ivc filter retrieval and central venography.  Areanna Gengler C. Donzetta Matters, MD [facility] Office: [phone] Pager: [phone]

## 2019-03-24 NOTE — Discharge Instructions (Signed)
Inferior Vena Cava Filter Removal, Care After °This sheet gives you information about how to care for yourself after your procedure. Your health care provider may also give you more specific instructions. If you have problems or questions, contact your health care provider. °What can I expect after the procedure? °After the procedure, it is common to have: °· Mild pain and bruising around your incision in your neck or groin. °· Fatigue. °Follow these instructions at home: °Incision care ° °· Follow instructions from your health care provider about how to take care of your incision. Make sure you: °? Wash your hands with soap and water before you change your bandage (dressing). If soap and water are not available, use hand sanitizer. °? Change your dressing as told by your health care provider. °· Check your incision area every day for signs of infection. Check for: °? Redness, swelling, or more pain. °? Fluid or blood. °? Warmth. °? Pus or a bad smell. °General instructions °· Take over-the-counter and prescription medicines only as told by your health care provider. °· Do not take baths, swim, or use a hot tub until your health care provider approves. Ask your health care provider if you may take showers. You may only be allowed to take sponge baths. °· Do not drive for 24 hours if you were given a medicine to help you relax (sedative) during your procedure. °· Return to your normal activities as told by your health care provider. Ask your health care provider what activities are safe for you. °· Keep all follow-up visits as told by your health care provider. This is important. °Contact a health care provider if: °· You have chills or a fever. °· You have redness, swelling, or more pain around your incision. °· Your incision feels warm to the touch. °· You have pus or a bad smell coming from your incision. °Get help right away if: °· You have blood coming from your incision (active bleeding). °? If you have  bleeding from the incision site, lie down, apply pressure to the area with a clean cloth or gauze, and get help right away. °· You have chest pain. °· You have difficulty breathing. °Summary °· Follow instructions from your health care provider about how to take care of your incision. °· Return to your normal activities as told by your health care provider. °· Check your incision area every day for signs of infection. °· Get help right away if you have active bleeding, chest pain, or trouble breathing. °This information is not intended to replace advice given to you by your health care provider. Make sure you discuss any questions you have with your health care provider. °Document Released: 11/23/2016 Document Revised: 04/27/2017 Document Reviewed: 11/23/2016 °[employer]vier Patient Education © 2020 [employer] ° °

## 2019-03-25 NOTE — Progress Notes (Signed)
HEMATOLOGY/ONCOLOGY CONSULTATION NOTE  Date of Service: 03/13/2019  Patient Care Team: Deland Pretty, MD as PCP - General (Internal Medicine)  REFERRING PHYSICIAN: Deland Pretty, MD  CHIEF COMPLAINTS/PURPOSE OF CONSULTATION:  Spontaneous retroperitoneal bleed, DVT, Pulmonary Embolism    HISTORY OF PRESENTING ILLNESS:   [name] is a wonderful 78 y.o. female who has been referred to [place] by Deland Pretty, MD for evaluation and management of Acute Pulmonary Embolism.The pt reports that she is doing well overall.   The pt reports that she was in the emergency room on 8/14 then again on the 8/17. The first time she went to the Emergency room her leg was swollen that Friday by the weekend her leg was even more enlarged. @-3 months before leg swelling there was no other symptoms , no new medications, no long distance travel, no injury she did have surgery 06/04/2017.Left: Findings consistent with acute deep vein thrombosis involving the left common femoral vein, left femoral vein, left proximal profunda vein, left popliteal vein, left posterior tibial veins, left peroneal veins, and left gastrocnemius veins.  Findings consistent with acute superficial vein thrombosis involving the left small saphenous vein. No cystic structure found in the popliteal fossa. [place] LE 8/17 showed Left: Findings consistent with acute deep vein thrombosis involving the left common femoral vein, left femoral vein, left proximal profunda vein, left popliteal vein, left posterior tibial veins, left peroneal veins, and left gastrocnemius veins.  Findings consistent with acute superficial vein thrombosis involving the left small saphenous vein. No cystic structure found in the popliteal fossa.  She was admitted 01/13/2019 with a retroperitoneal bleed and had IVC filter placed and anticoagulation held. Of note prior to the patient's visit today, pt has had CT ANGIOGRAPHY OF ABDOMINAL AORTA WITH ILIOFEMORAL RUNOFF completed  on 01/13/2019 with results revealing "Left-sided retroperitoneal hemorrhage with no evidence of active extravasation. The greatest diameter is estimated 8.3 cm, with the majority of hemorrhage  CT demonstrates left-sided DVT, involving left common iliac vein, internal iliac veins, external iliac vein, common femoral vein, femoral vein, popliteal vein, and possibly tibial veins. Correlation with duplex might be useful."  Of note prior to the patient's visit today, pt has had CT Angio Chest PE W or WO Contrast completed on 01/28/2019 with results revealing "Extensive acute pulmonary emboli involving the lobar and segmental branches of all pulmonary lobes. No main branch or saddle embolism.There is CT evidence of right heart strain."   She is on Eliquis for 6 weeks. -She has an appointment to have the filter removed October 26th  -Pt started oxygen after the discovery of clots in lungs.   Most recent lab results (02/25/2019) of CBC is as follows: all values are WNL except for Glucose at 106, Creatinine Ser at 1.04, GFR non calc Af Amer at 53, Platelets at 417.  On review of systems, pt reports leg swelling, discoloration in left leg and denies previous blood clots and any other symptoms.   On PMHx the pt reports 06/04/2017 Posterior Colporrhaphy  On Social Hx the pt reports previous smoker, hasn't smoked in 15 years   INTERVAL HISTORY:   I connected with  [name] on 03/26/19 by telephone and verified that I am speaking with the correct person using two identifiers.   I discussed the limitations of evaluation and management by telemedicine. The patient expressed understanding and agreed to proceed.  Other persons participating in the visit and their role in the encounter:     -Jazzmine  Danelle Earthly, Medical Scribe  Patient's location: Home Provider's location: [facility] is a wonderful 78 y.o. female who is here for evaluation and management of  Acute Pulmonary Embolism. The patient's last visit with [place] was on 03/13/2019. The pt reports that she is doing well overall.  The pt reports that she had her IVC filter removed on 10/26 which was somewhat difficult. Her leg swelling has gone down and she has had no concerns with taking Eliquis. Walking does not make her legs swell or discolored. Pt has been wearing her compression socks regularly. Pt goes back to see [name] on 12/04.  She does not have a follow-up with Dr. Shelia Media scheduled at this time but will reach out to set one up.   Of note since the patient's last visit, pt has had JAK2 sequencing completed on 03/13/2019 with results revealing "No mutations".   Lab results (03/13/19) of CBC w/diff and CMP is as follows: all values are WNL except for PLTs at 413K, Creatinine at 1.21, GFR Est Non Af Am at 44. 03/13/2019 Homocysteine at 47.5 03/13/2019 Factor 5 leiden is "Negative"  03/13/2019 Protein S at 118 03/13/2019 Protein S Activity at 116 03/13/2019 Total protein C at 89 03/13/2019 Protein C activity at 134 03/13/2019 dRVVT Mix at 54.2 03/13/2019 dRVVT Confirm at 1.2  03/13/2019 Prothrombin gene mutation shows "No mutation identified".  03/13/2019 Lupus anticoagulant panel is as follows: PTT Lupus Anticoagulant at 38.4, DRVVT at 79.8 03/13/2019 Cardiolipin antibodies IgG, IgM, IgA is as follows: Anticardiolipin IgG at <9, Anticardiolipin IgM at <9, Anticardiolipin IgA at <9 03/13/2019 Beta-2-glycoprotein I abs, IgG/M/A is as follows: Beta-2 Glyco IgG at <9, Beta-2-Glycoprotein IgM at <9,  Beta-2-Glycoprotein IgA at <9   On review of systems, pt reports improved leg swelling and denies SOB any other symptoms.   MEDICAL HISTORY:  Past Medical History:  Diagnosis Date   Allergic rhinitis    Asthma    Chronic airway obstruction, not elsewhere classified    Depressive disorder, not elsewhere classified    Esophageal reflux      SURGICAL HISTORY: Past Surgical History:    Procedure Laterality Date   ABDOMINAL HYSTERECTOMY     CYSTOSCOPY     IVC FILTER REMOVAL N/A 03/24/2019   Procedure: IVC FILTER REMOVAL;  Surgeon: Waynetta Sandy, MD;  Location: [place] [facility];  Service: Cardiovascular;  Laterality: N/A;   NASAL SINUS SURGERY     VENA CAVA FILTER PLACEMENT N/A 01/13/2019   Procedure: INSERTION VENA-CAVA FILTER;  Surgeon: Waynetta Sandy, MD;  Location: [facility] OR;  Service: Vascular;  Laterality: N/A;     SOCIAL HISTORY: Social History   Socioeconomic History   Marital status: Divorced    Spouse name: Not on file   Number of children: Not on file   Years of education: Not on file   Highest education level: Not on file  Occupational History   Occupation: Disabled  Social Needs   Financial resource strain: Not on file   Food insecurity    Worry: Not on file    Inability: Not on file   Transportation needs    Medical: Not on file    Non-medical: Not on file  Tobacco Use   Smoking status: Former Smoker    Packs/day: 0.25    Years: 40.00    Pack years: 10.00    Types: Cigarettes    Quit date: 05/27/2013    Years since  quitting: 5.8   Smokeless tobacco: Never Used  Substance and Sexual Activity   Alcohol use: No   Drug use: No   Sexual activity: Not on file  Lifestyle   Physical activity    Days per week: Not on file    Minutes per session: Not on file   Stress: Not on file  Relationships   Social connections    Talks on phone: Not on file    Gets together: Not on file    Attends religious service: Not on file    Active member of club or organization: Not on file    Attends meetings of clubs or organizations: Not on file    Relationship status: Not on file   Intimate partner violence    Fear of current or ex partner: Not on file    Emotionally abused: Not on file    Physically abused: Not on file    Forced sexual activity: Not on file  Other Topics Concern   Not on file  Social  History Narrative   Not on file     FAMILY HISTORY: Family History  Problem Relation Age of Onset   Diabetes Mother    Coronary artery disease Mother    Emphysema Father    Coronary artery disease Brother      ALLERGIES:   is allergic to clarithromycin; penicillins; pneumococcal vaccines; sulfonamide derivatives; and estradiol.   MEDICATIONS:  Current Outpatient Medications  Medication Sig Dispense Refill   albuterol (PROVENTIL) (2.5 MG/3ML) 0.083% nebulizer solution Take 3 mLs (2.5 mg total) by nebulization every 6 (six) hours. 75 mL 3   apixaban (ELIQUIS) 5 MG TABS tablet Take 5 mg by mouth 2 (two) times daily.     aspirin EC 81 MG EC tablet Take 1 tablet (81 mg total) by mouth daily. 30 tablet 0   cephALEXin (KEFLEX) 250 MG capsule Take 250 mg by mouth daily.     cetirizine (ZYRTEC) 10 MG tablet Take 10 mg by mouth as needed for allergies.      cyclobenzaprine (FLEXERIL) 10 MG tablet Take 1 tablet (10 mg total) by mouth 3 (three) times daily as needed for muscle spasms. 15 tablet 0   Fluticasone-Salmeterol (ADVAIR) 250-50 MCG/DOSE AEPB INHALE 1 DOSE BY MOUTH TWICE DAILY, RINSE MOUTH AFTER USE (Patient taking differently: Inhale 1 puff into the lungs 2 (two) times daily. ) 60 each 2   Glycopyrrolate-Formoterol (BEVESPI AEROSPHERE) 9-4.8 MCG/ACT AERO Inhale 2 puffs into the lungs 2 (two) times a day. (Patient not taking: Reported on 02/25/2019) 2 g 0   HYDROcodone-acetaminophen (NORCO) 7.5-325 MG tablet Take 1 tablet by mouth every 6 (six) hours as needed for moderate pain. (Patient not taking: Reported on 03/18/2019) 8 tablet 0   levalbuterol (XOPENEX HFA) 45 MCG/ACT inhaler Inhale 1-2 puffs into the lungs every 4 (four) hours as needed for wheezing.     LORazepam (ATIVAN) 1 MG tablet Take 0.5-1 mg by mouth 2 (two) times daily as needed for anxiety or sleep.      omeprazole (PRILOSEC) 20 MG capsule Take 20 mg by mouth daily.      potassium chloride (KLOR-CON) 8  MEQ tablet Take 8 mEq by mouth daily.     Tiotropium Bromide Monohydrate (SPIRIVA RESPIMAT) 2.5 MCG/ACT AERS Inhale 1 puff into the lungs daily with lunch.      traZODone (DESYREL) 100 MG tablet Take 100 mg by mouth at bedtime.      triamcinolone (KENALOG) 0.025 % cream Apply 1  application topically 2 (two) times daily as needed (skin irritation/rash).     triamterene-hydrochlorothiazide (MAXZIDE-25) 37.5-25 MG tablet Take 0.5 tablets by mouth daily.     No current facility-administered medications for this visit.      REVIEW OF SYSTEMS:   A 10+ POINT REVIEW OF SYSTEMS WAS OBTAINED including neurology, dermatology, psychiatry, cardiac, respiratory, lymph, extremities, GI, GU, Musculoskeletal, constitutional, breasts, reproductive, HEENT.  All pertinent positives are noted in the HPI.  All others are negative.   PHYSICAL EXAMINATION: ECOG PERFORMANCE STATUS: 2 - Symptomatic, <50% confined to bed  There were no vitals filed for this visit. There were no vitals filed for this visit. There is no height or weight on file to calculate BMI.  Telehealth visit  LABORATORY DATA:  I have reviewed the data as listed  CBC Latest Ref Rng & Units 03/13/2019 02/25/2019 02/02/2019  WBC 4.0 - 10.5 K/uL 7.0 6.4 9.6  Hemoglobin 12.0 - 15.0 g/dL 14.6 13.9 10.3(L)  Hematocrit 36.0 - 46.0 % 46.0 44.7 33.9(L)  Platelets 150 - 400 K/uL 413(H) 417(H) 430(H)    CMP Latest Ref Rng & Units 03/13/2019 02/25/2019 02/02/2019  Glucose 70 - 99 mg/dL 92 106(H) 154(H)  BUN 8 - 23 mg/dL 15 15 15   Creatinine 0.44 - 1.00 mg/dL 1.21(H) 1.04(H) 0.80  Sodium 135 - 145 mmol/L 141 137 138  Potassium 3.5 - 5.1 mmol/L 4.1 4.4 4.3  Chloride 98 - 111 mmol/L 103 101 104  CO2 22 - 32 mmol/L 29 25 26   Calcium 8.9 - 10.3 mg/dL 9.2 9.0 8.7(L)  Total Protein 6.5 - 8.1 g/dL 7.4 - -  Total Bilirubin 0.3 - 1.2 mg/dL 0.6 - -  Alkaline Phos 38 - 126 U/L 65 - -  AST 15 - 41 U/L 20 - -  ALT 0 - 44 U/L 14 - -   03/13/2019 JAK2  Sequencing:    01/28/2019: ([id number]) CT Angio Chest PE W or WO Contrast  01/13/2019:([phone])  CT ANGIOGRAPHY OF ABDOMINAL AORTA WITH ILIOFEMORAL RUNOFF   RADIOGRAPHIC STUDIES: I have personally reviewed the radiological images as listed and agreed with the findings in the report.  Ct Angio Abd/pel W And/or Wo Contrast  Result Date: 02/25/2019 CLINICAL DATA:  78 year old female with possible retroperitoneal hemorrhage and abdominal pain EXAM: CTA ABDOMEN AND PELVIS wITHOUT AND WITH CONTRAST TECHNIQUE: Multidetector CT imaging of the abdomen and pelvis was performed using the standard protocol during bolus administration of intravenous contrast. Multiplanar reconstructed images and MIPs were obtained and reviewed to evaluate the vascular anatomy. CONTRAST:  120mL OMNIPAQUE IOHEXOL 350 MG/ML SOLN COMPARISON:  None. FINDINGS: VASCULAR Aorta: Atherosclerotic changes of the lower thoracic and the abdominal aorta. No aneurysm or dissection. Celiac: Mild atherosclerotic changes at the celiac artery origin. Celiac artery patent. SMA: SMA patent with atherosclerotic changes. Renals: Bilateral renal arteries are patent with mild atherosclerotic changes. IMA: Inferior mesenteric artery is patent. Right lower extremity: Mild to moderate atherosclerotic changes of the right iliac artery. No aneurysm or dissection. Hypogastric arteries patent, ectatic at the origin measuring 12 mm. External iliac artery patent. Common femoral artery patent. High bifurcation on the right. Proximal profunda femoris and SFA patent. Left lower extremity: Mild to moderate atherosclerotic changes of the left iliac system. Hypogastric arteries patent. External iliac artery patent. Common femoral artery is patent. High bifurcation of the left common femoral artery. Proximal profunda femoris and SFA patent. Veins: IVC filter in position. Developing pelvic collateral veins in the setting of prior left DVT. Review  of the MIP images  confirms the above findings. NON-VASCULAR Lower chest: Paraseptal and centrilobular emphysema at the bilateral lung bases with scarring. No acute finding. Hepatobiliary: Focal fatty sparing at the false form ligament. Unremarkable gall bladder. Pancreas: Unremarkable. Spleen: Unremarkable. Adrenals/Urinary Tract: Unremarkable appearance of adrenal glands. Right: No hydronephrosis. Symmetric perfusion to the left. No nephrolithiasis. Unremarkable course of the right ureter. Left: Low-density cyst on the anterior aspect of the left kidney. No other lesion identified within the left kidney. No hydronephrosis or nephrolithiasis. The kidney is more anatomically position after near complete resolution of the hematoma. Unremarkable appearance of the urinary bladder . Stomach/Bowel: Hiatal hernia. Unremarkable appearance of the stomach. Unremarkable appearance of small bowel. No evidence of obstruction. Appendix is not visualized, however, no inflammatory changes are present adjacent to the cecum to indicate an appendicitis. Colonic diverticular disease without evidence of acute inflammation. Lymphatic: No adenopathy. Mesenteric: The retroperitoneal hematoma has nearly completely resolved. There is residual soft tissue/hematoma overlying the most superior aspect of the psoas muscle measuring 3.2 cm on image 52 of series 5. This is not well evaluated by CT angiogram though presumed to represent residual resolving hematoma. Reproductive: Hysterectomy.  Unremarkable appearance of the ovaries. Other: Small fat containing umbilical hernia. Musculoskeletal: No acute displaced fracture. Degenerative changes of the thoracolumbar spine. IMPRESSION: No acute CT finding. There has been near complete resolution of the previous left-sided retroperitoneal hematoma. The current CT demonstrates no evidence of prior source, which was presumed again to be spontaneous. IVC filter in position. Developing pelvic venous collaterals in the  setting of left DVT. Emphysematous changes at the lung bases. The nodularity at the left lung base is relatively unchanged, potentially representing chronic scarring/inflammatory changes. As previously noted, a 1 year follow-up chest CT from the prior August study is recommended. Aortic Atherosclerosis (ICD10-I70.0). Signed, Dulcy Fanny. Dellia Nims, RPVI [facility] Electronically Signed   By: Corrie Mckusick D.O.   On: 02/25/2019 16:59     ASSESSMENT & PLAN:   GRECIA NAUGHER is a 78 y.o. female with:  1. Left lower etremity DVT 2. Extensive bilateral PE 3. Retroperitoneal bleed - spontaneous 4. S/p IVC filter  PLAN: -Discussed pt labwork, 03/13/19; all values are WNL except for PLTs at 413K, Creatinine at 1.21, GFR Est Non Af Am at 44. -Discussed 03/13/2019 Homocysteine at 47.5 -Discussed 03/13/2019 Factor 5 leiden is "Negative"  -Discussed 03/13/2019 Protein S at 118 -Discussed 03/13/2019 Protein S Activity at 116 -Discussed 03/13/2019 Total protein C at 89 -Discussed 03/13/2019 Protein C activity at 134 -Discussed 03/13/2019 dRVVT Mix at 54.2 -Discussed 03/13/2019 dRVVT Confirm at 1.2  -Discussed 03/13/2019 Prothrombin gene mutation shows "No mutation identified".  -Discussed 03/13/2019 Lupus anticoagulant panel is as follows: PTT Lupus Anticoagulant at 38.4, DRVVT at 79.8 -Discussed 03/13/2019 Cardiolipin antibodies IgG, IgM, IgA is as follows: Anticardiolipin IgG at <9, Anticardiolipin IgM at <9, Anticardiolipin IgA at <9 -Discussed 03/13/2019 Beta-2-glycoprotein I abs, IgG/M/A is as follows: Beta-2 Glyco IgG at <9, Beta-2-Glycoprotein IgM at <9,  Beta-2-Glycoprotein IgA at <9  -Discussed 03/13/2019 JAK2 sequencing which revealed "No mutations" -Labs do not suggest a primary bone marrow issue creating thrombocytosis, appears to be from blood loss -Recommended continuing life-long blood thinners for unprovoked extensive VTE -Will  send recommendation to Dr. Shelia Media -Will consider a preventive dose of blood thinners if repeat US has no significant findings and labs stable in a year  -Discussed again risks and chances of clots reoccurring without blood  thinners. -Will monitor for recurrent bleeding on blood thinners. -If risk of bleeding outweight risk of blood clots will reconsider blood thinners  -Continue using compression socks -Recommended to f/u with Dr. Shelia Media for further cancer screenings/repeat bloodwork in 1-2 months -Will get repeat US B/L Lower Extremities in 4 months, before visit  -Will see back in 4 months  FOLLOW UP US bilateral venous lower extremities in 16 weeks RTC with Dr Irene Limbo with labs in 4 months  The total time spent in the appt was 20 minutes and more than 50% was on counseling and direct patient cares.  All of the patient's questions were answered with apparent satisfaction. The patient knows to call the clinic with any problems, questions or concerns.   Sullivan Lone MD [place] AAHIVMS [facility] Hematology/Oncology Physician [facility]  (Office):       [phone] (Work cell):  [phone] (Fax):           [phone]  03/26/2019 4:03 PM  I, Yevette Edwards, am acting as a scribe for Dr. Sullivan Lone.   .I have reviewed the above documentation for accuracy and completeness, and I agree with the above. Brunetta Genera MD

## 2019-03-26 ENCOUNTER — Inpatient Hospital Stay (HOSPITAL_BASED_OUTPATIENT_CLINIC_OR_DEPARTMENT_OTHER): Payer: PPO | Admitting: Hematology

## 2019-03-26 DIAGNOSIS — I2609 Other pulmonary embolism with acute cor pulmonale: Secondary | ICD-10-CM | POA: Diagnosis not present

## 2019-03-26 DIAGNOSIS — R58 Hemorrhage, not elsewhere classified: Secondary | ICD-10-CM | POA: Diagnosis not present

## 2019-03-26 DIAGNOSIS — I82422 Acute embolism and thrombosis of left iliac vein: Secondary | ICD-10-CM

## 2019-03-26 DIAGNOSIS — I2699 Other pulmonary embolism without acute cor pulmonale: Secondary | ICD-10-CM | POA: Diagnosis not present

## 2019-03-26 LAB — JAK2 (INCLUDING V617F AND EXON 12), MPL,& CALR-NEXT GEN SEQ

## 2019-03-27 ENCOUNTER — Telehealth: Payer: Self-pay | Admitting: Hematology

## 2019-03-27 NOTE — Telephone Encounter (Signed)
Scheduled per los. Called and left msg. Mailed printout  °

## 2019-04-01 DIAGNOSIS — Z0000 Encounter for general adult medical examination without abnormal findings: Secondary | ICD-10-CM | POA: Diagnosis not present

## 2019-04-01 DIAGNOSIS — E559 Vitamin D deficiency, unspecified: Secondary | ICD-10-CM | POA: Diagnosis not present

## 2019-04-01 DIAGNOSIS — E7800 Pure hypercholesterolemia, unspecified: Secondary | ICD-10-CM | POA: Diagnosis not present

## 2019-04-01 DIAGNOSIS — J449 Chronic obstructive pulmonary disease, unspecified: Secondary | ICD-10-CM | POA: Diagnosis not present

## 2019-04-08 DIAGNOSIS — I82411 Acute embolism and thrombosis of right femoral vein: Secondary | ICD-10-CM | POA: Diagnosis not present

## 2019-04-08 DIAGNOSIS — Z23 Encounter for immunization: Secondary | ICD-10-CM | POA: Diagnosis not present

## 2019-04-08 DIAGNOSIS — Z7901 Long term (current) use of anticoagulants: Secondary | ICD-10-CM | POA: Diagnosis not present

## 2019-04-08 DIAGNOSIS — R58 Hemorrhage, not elsewhere classified: Secondary | ICD-10-CM | POA: Diagnosis not present

## 2019-04-08 DIAGNOSIS — Z86711 Personal history of pulmonary embolism: Secondary | ICD-10-CM | POA: Diagnosis not present

## 2019-04-08 DIAGNOSIS — E559 Vitamin D deficiency, unspecified: Secondary | ICD-10-CM | POA: Diagnosis not present

## 2019-04-08 DIAGNOSIS — Z0000 Encounter for general adult medical examination without abnormal findings: Secondary | ICD-10-CM | POA: Diagnosis not present

## 2019-04-08 DIAGNOSIS — E7800 Pure hypercholesterolemia, unspecified: Secondary | ICD-10-CM | POA: Diagnosis not present

## 2019-04-30 ENCOUNTER — Telehealth: Payer: Self-pay

## 2019-04-30 NOTE — Telephone Encounter (Signed)
ERROR

## 2019-05-01 ENCOUNTER — Other Ambulatory Visit: Payer: Self-pay

## 2019-05-01 ENCOUNTER — Telehealth (HOSPITAL_COMMUNITY): Payer: Self-pay | Admitting: *Deleted

## 2019-05-01 DIAGNOSIS — I82402 Acute embolism and thrombosis of unspecified deep veins of left lower extremity: Secondary | ICD-10-CM

## 2019-05-01 NOTE — Telephone Encounter (Signed)

## 2019-05-02 ENCOUNTER — Other Ambulatory Visit: Payer: Self-pay

## 2019-05-02 ENCOUNTER — Ambulatory Visit (HOSPITAL_COMMUNITY)
Admission: RE | Admit: 2019-05-02 | Discharge: 2019-05-02 | Disposition: A | Discharge status: Home / Self Care | Payer: PPO | Source: Ambulatory Visit | Attending: Vascular Surgery | Admitting: Vascular Surgery

## 2019-05-02 ENCOUNTER — Ambulatory Visit (INDEPENDENT_AMBULATORY_CARE_PROVIDER_SITE_OTHER): Payer: PPO | Admitting: Vascular Surgery

## 2019-05-02 ENCOUNTER — Encounter: Payer: Self-pay | Admitting: Vascular Surgery

## 2019-05-02 VITALS — BP 149/90 | HR 67 | Temp 97.7°F | Resp 20 | Ht 66.0 in | Wt 169.0 lb

## 2019-05-02 DIAGNOSIS — I82402 Acute embolism and thrombosis of unspecified deep veins of left lower extremity: Secondary | ICD-10-CM

## 2019-05-02 NOTE — Progress Notes (Signed)
Patient ID: [name], female   DOB: Dec 17, 1940, 78 y.o.   MRN: [id number]  Reason for Consult: No chief complaint on file.   Referred by Deland Pretty, MD  Subjective:     HPI:  [name] is a 78 y.o. female with history of left retroperitoneal hematoma and extensive left lower extremity DVT.  Filter has now been removed as she is tolerating Eliquis.  Does have persistent swelling of the left lower extremity.  Presents today for evaluation of left lower extremity venous reflux.  States she also swelling of her but.  Swelling to her is very disturbing.  Pain in her back is significantly improved after filter removed.  Past Medical History:  Diagnosis Date  . Allergic rhinitis   . Asthma   . Chronic airway obstruction, not elsewhere classified   . Depressive disorder, not elsewhere classified   . Esophageal reflux    Family History  Problem Relation Age of Onset  . Diabetes Mother   . Coronary artery disease Mother   . Emphysema Father   . Coronary artery disease Brother    Past Surgical History:  Procedure Laterality Date  . ABDOMINAL HYSTERECTOMY    . CYSTOSCOPY    . IVC FILTER REMOVAL N/A 03/24/2019   Procedure: IVC FILTER REMOVAL;  Surgeon: Waynetta Sandy, MD;  Location: [facility];  Service: Cardiovascular;  Laterality: N/A;  . NASAL SINUS SURGERY    . VENA CAVA FILTER PLACEMENT N/A 01/13/2019   Procedure: INSERTION VENA-CAVA FILTER;  Surgeon: Waynetta Sandy, MD;  Location: [facility];  Service: Vascular;  Laterality: N/A;    Short Social History:  Social History   Tobacco Use  . Smoking status: Former Smoker    Packs/day: 0.25    Years: 40.00    Pack years: 10.00    Types: Cigarettes    Quit date: 05/27/2013    Years since quitting: 5.9  . Smokeless tobacco: Never Used  Substance Use Topics  . Alcohol use: No    Allergies  Allergen Reactions  . Clarithromycin Swelling  . Penicillins Swelling    Tongue swelling Did it  involve swelling of the face/tongue/throat, SOB, or low BP? Yes Did it involve sudden or severe rash/hives, skin peeling, or any reaction on the inside of your mouth or nose? Yes Did you need to seek medical attention at a hospital or doctor's office? Yes When did it last happen?childhood If all above answers are "NO", may proceed with cephalosporin use.   . Pneumococcal Vaccines Other (See Comments)    Couldn't breathe  . Sulfonamide Derivatives Swelling  . Estradiol Rash    Cream gives her a rash    Current Outpatient Medications  Medication Sig Dispense Refill  . albuterol (PROVENTIL) (2.5 MG/3ML) 0.083% nebulizer solution Take 3 mLs (2.5 mg total) by nebulization every 6 (six) hours. 75 mL 3  . apixaban (ELIQUIS) 5 MG TABS tablet Take 5 mg by mouth 2 (two) times daily.    Marland Kitchen aspirin EC 81 MG EC tablet Take 1 tablet (81 mg total) by mouth daily. 30 tablet 0  . cephALEXin (KEFLEX) 250 MG capsule Take 250 mg by mouth daily.    . cetirizine (ZYRTEC) 10 MG tablet Take 10 mg by mouth as needed for allergies.     . cyclobenzaprine (FLEXERIL) 10 MG tablet Take 1 tablet (10 mg total) by mouth 3 (three) times daily as needed for muscle spasms. 15 tablet 0  . Fluticasone-Salmeterol (ADVAIR)  250-50 MCG/DOSE AEPB INHALE 1 DOSE BY MOUTH TWICE DAILY, RINSE MOUTH AFTER USE (Patient taking differently: Inhale 1 puff into the lungs 2 (two) times daily. ) 60 each 2  . Glycopyrrolate-Formoterol (BEVESPI AEROSPHERE) 9-4.8 MCG/ACT AERO Inhale 2 puffs into the lungs 2 (two) times a day. (Patient not taking: Reported on 02/25/2019) 2 g 0  . HYDROcodone-acetaminophen (NORCO) 7.5-325 MG tablet Take 1 tablet by mouth every 6 (six) hours as needed for moderate pain. (Patient not taking: Reported on 03/18/2019) 8 tablet 0  . levalbuterol (XOPENEX HFA) 45 MCG/ACT inhaler Inhale 1-2 puffs into the lungs every 4 (four) hours as needed for wheezing.    Marland Kitchen LORazepam (ATIVAN) 1 MG tablet Take 0.5-1 mg by mouth 2 (two)  times daily as needed for anxiety or sleep.     Marland Kitchen omeprazole (PRILOSEC) 20 MG capsule Take 20 mg by mouth daily.     . potassium chloride (KLOR-CON) 8 MEQ tablet Take 8 mEq by mouth daily.    . Tiotropium Bromide Monohydrate (SPIRIVA RESPIMAT) 2.5 MCG/ACT AERS Inhale 1 puff into the lungs daily with lunch.     . traZODone (DESYREL) 100 MG tablet Take 100 mg by mouth at bedtime.     . triamcinolone (KENALOG) 0.025 % cream Apply 1 application topically 2 (two) times daily as needed (skin irritation/rash).    . triamterene-hydrochlorothiazide (MAXZIDE-25) 37.5-25 MG tablet Take 0.5 tablets by mouth daily.     No current facility-administered medications for this visit.     Review of Systems  Constitutional:  Constitutional negative. HENT: HENT negative.  Eyes: Eyes negative.  Cardiovascular: Positive for leg swelling.  GI: Gastrointestinal negative.  Musculoskeletal: Musculoskeletal negative.  Neurological: Neurological negative. Hematologic: Hematologic/lymphatic negative.  Psychiatric: Psychiatric negative.        Objective:   Vitals:   05/02/19 1518  BP: (!) 149/90  Pulse: 67  Resp: 20  Temp: 97.7 F (36.5 C)  SpO2: 95%    Physical Exam Constitutional:      Appearance: She is normal weight.  HENT:     Head: Normocephalic.     Nose: Nose normal.     Mouth/Throat:     Mouth: Mucous membranes are moist.  Eyes:     Pupils: Pupils are equal, round, and reactive to light.  Neck:     Musculoskeletal: Normal range of motion and neck supple.  Cardiovascular:     Rate and Rhythm: Normal rate.  Pulmonary:     Effort: Pulmonary effort is normal.  Abdominal:     General: Abdomen is flat.     Palpations: Abdomen is soft.  Musculoskeletal: Normal range of motion.     Right lower leg: No edema.     Left lower leg: Edema present.  Skin:    General: Skin is warm.     Capillary Refill: Capillary refill takes less than 2 seconds.  Neurological:     General: No focal deficit  present.     Mental Status: She is alert.  Psychiatric:        Mood and Affect: Mood normal.        Behavior: Behavior normal.        Thought Content: Thought content normal.        Judgment: Judgment normal.     Data: I have independently interpreted her left lower extremity venous reflux study.  She has full compressibility at the saphenofemoral junction no compressibility of the common femoral vein.  There is reflux in the common femoral  and popliteal system popliteal 4093 ms.  Reflux in the greater saphenous vein at the junction as well as proximal thigh and measures 0.55 cm in the proximal thigh.     Assessment/Plan:     78 year old female status post IVC filter placement for extensive left lower extremity DVT and return.  Hematoma.  She is now on Eliquis tolerating this well.  Filter is now out.  Continues to have left lower extremity swelling.  Does have reflux in her deep and superficial system.  I have offered continued watchful waiting.  She is wearing compression stockings.  At this point she does want some intervention.  I would not recommend any intervention of her saphenous veins until we rule out obstructive process in the deep system.  We will begin with venogram from a prone approach left popliteal.     Waynetta Sandy MD [facility]

## 2019-05-05 ENCOUNTER — Other Ambulatory Visit: Payer: Self-pay

## 2019-05-22 ENCOUNTER — Other Ambulatory Visit (HOSPITAL_COMMUNITY): Payer: PPO

## 2019-05-22 ENCOUNTER — Other Ambulatory Visit (HOSPITAL_COMMUNITY)
Admission: RE | Admit: 2019-05-22 | Discharge: 2019-05-22 | Disposition: A | Discharge status: Home / Self Care | Payer: PPO | Source: Ambulatory Visit | Attending: Vascular Surgery | Admitting: Vascular Surgery

## 2019-05-22 DIAGNOSIS — Z01812 Encounter for preprocedural laboratory examination: Secondary | ICD-10-CM | POA: Diagnosis not present

## 2019-05-22 DIAGNOSIS — Z20828 Contact with and (suspected) exposure to other viral communicable diseases: Secondary | ICD-10-CM | POA: Diagnosis not present

## 2019-05-23 LAB — NOVEL CORONAVIRUS, NAA (HOSP ORDER, SEND-OUT TO REF LAB; TAT 18-24 HRS): SARS-CoV-2, NAA: NOT DETECTED

## 2019-05-26 ENCOUNTER — Ambulatory Visit (HOSPITAL_COMMUNITY)
Admission: RE | Admit: 2019-05-26 | Discharge: 2019-05-26 | Disposition: A | Discharge status: Home / Self Care | Payer: PPO | Attending: Vascular Surgery | Admitting: Vascular Surgery

## 2019-05-26 ENCOUNTER — Ambulatory Visit (HOSPITAL_COMMUNITY)
Admission: RE | Disposition: A | Discharge status: Home / Self Care | Payer: Self-pay | Source: Home / Self Care | Attending: Vascular Surgery

## 2019-05-26 ENCOUNTER — Other Ambulatory Visit: Payer: Self-pay

## 2019-05-26 DIAGNOSIS — I82402 Acute embolism and thrombosis of unspecified deep veins of left lower extremity: Secondary | ICD-10-CM | POA: Insufficient documentation

## 2019-05-26 DIAGNOSIS — I87002 Postthrombotic syndrome without complications of left lower extremity: Secondary | ICD-10-CM | POA: Diagnosis not present

## 2019-05-26 HISTORY — PX: PERIPHERAL VASCULAR THROMBECTOMY: CATH118306

## 2019-05-26 HISTORY — PX: INTRAVASCULAR ULTRASOUND/IVUS: CATH118244

## 2019-05-26 HISTORY — PX: LOWER EXTREMITY VENOGRAPHY: CATH118253

## 2019-05-26 HISTORY — PX: PERIPHERAL VASCULAR INTERVENTION: CATH118257

## 2019-05-26 SURGERY — LOWER EXTREMITY VENOGRAPHY
Anesthesia: LOCAL | Laterality: Left

## 2019-05-26 MED ORDER — ACETAMINOPHEN 325 MG PO TABS: 650.0000 mg | ORAL_TABLET | ORAL | Status: DC | PRN

## 2019-05-26 MED ORDER — HEPARIN SODIUM (PORCINE) 1000 UNIT/ML IJ SOLN
INTRAMUSCULAR | Status: DC | PRN
  Administered 2019-05-26: 5000 [IU] via INTRAVENOUS

## 2019-05-26 MED ORDER — HEPARIN (PORCINE) IN NACL 1000-0.9 UT/500ML-% IV SOLN
INTRAVENOUS | Status: AC
  Filled 2019-05-26: qty 500

## 2019-05-26 MED ORDER — HEPARIN SODIUM (PORCINE) 1000 UNIT/ML IJ SOLN
INTRAMUSCULAR | Status: AC
  Filled 2019-05-26: qty 1

## 2019-05-26 MED ORDER — HYDRALAZINE HCL 20 MG/ML IJ SOLN: 5.0000 mg | INTRAMUSCULAR | Status: DC | PRN

## 2019-05-26 MED ORDER — ASPIRIN 325 MG PO TABS
ORAL_TABLET | ORAL | Status: DC | PRN
  Administered 2019-05-26: 325 mg via ORAL

## 2019-05-26 MED ORDER — SODIUM CHLORIDE 0.9% FLUSH: 3.0000 mL | INTRAVENOUS | Status: DC | PRN

## 2019-05-26 MED ORDER — SODIUM CHLORIDE 0.9 % IV SOLN: 250.0000 mL | INTRAVENOUS | Status: DC | PRN

## 2019-05-26 MED ORDER — SODIUM CHLORIDE 0.9 % IV SOLN: INTRAVENOUS | Status: AC

## 2019-05-26 MED ORDER — FENTANYL CITRATE (PF) 100 MCG/2ML IJ SOLN
INTRAMUSCULAR | Status: DC | PRN
  Administered 2019-05-26 (×2): 25 ug via INTRAVENOUS
  Administered 2019-05-26: 50 ug via INTRAVENOUS

## 2019-05-26 MED ORDER — SODIUM CHLORIDE 0.9 % IV SOLN: INTRAVENOUS | Status: DC

## 2019-05-26 MED ORDER — HEPARIN (PORCINE) IN NACL 1000-0.9 UT/500ML-% IV SOLN
INTRAVENOUS | Status: DC | PRN
  Administered 2019-05-26: 500 mL

## 2019-05-26 MED ORDER — ASPIRIN 325 MG PO TABS
ORAL_TABLET | ORAL | Status: AC
  Filled 2019-05-26: qty 1

## 2019-05-26 MED ORDER — HYDROMORPHONE HCL 1 MG/ML IJ SOLN
INTRAMUSCULAR | Status: DC | PRN
  Administered 2019-05-26: 0.5 mg via INTRAVENOUS

## 2019-05-26 MED ORDER — HYDROMORPHONE HCL 1 MG/ML IJ SOLN
INTRAMUSCULAR | Status: AC
  Filled 2019-05-26: qty 0.5

## 2019-05-26 MED ORDER — LABETALOL HCL 5 MG/ML IV SOLN: 10.0000 mg | INTRAVENOUS | Status: DC | PRN

## 2019-05-26 MED ORDER — SODIUM CHLORIDE 0.9% FLUSH: 3.0000 mL | Freq: Two times a day (BID) | INTRAVENOUS | Status: DC

## 2019-05-26 MED ORDER — LIDOCAINE HCL (PF) 1 % IJ SOLN
INTRAMUSCULAR | Status: AC
  Filled 2019-05-26: qty 30

## 2019-05-26 MED ORDER — FENTANYL CITRATE (PF) 100 MCG/2ML IJ SOLN
INTRAMUSCULAR | Status: AC
  Filled 2019-05-26: qty 2

## 2019-05-26 MED ORDER — MIDAZOLAM HCL 2 MG/2ML IJ SOLN
INTRAMUSCULAR | Status: AC
  Filled 2019-05-26: qty 2

## 2019-05-26 MED ORDER — IODIXANOL 320 MG/ML IV SOLN
INTRAVENOUS | Status: DC | PRN
  Administered 2019-05-26: 70 mL via INTRAVENOUS

## 2019-05-26 MED ORDER — ONDANSETRON HCL 4 MG/2ML IJ SOLN: 4.0000 mg | Freq: Four times a day (QID) | INTRAMUSCULAR | Status: DC | PRN

## 2019-05-26 MED ORDER — MIDAZOLAM HCL 2 MG/2ML IJ SOLN
INTRAMUSCULAR | Status: DC | PRN
  Administered 2019-05-26: 2 mg via INTRAVENOUS
  Administered 2019-05-26 (×2): 1 mg via INTRAVENOUS

## 2019-05-26 SURGICAL SUPPLY — 23 items
BALLN MUSTANG 12X80X75 (BALLOONS) ×3
BALLN MUSTANG 8X80X75 (BALLOONS) ×3
BALLOON MUSTANG 12X80X75 (BALLOONS) IMPLANT
BALLOON MUSTANG 8X80X75 (BALLOONS) IMPLANT
CATH ANGIO 5F BER2 65CM (CATHETERS) ×1 IMPLANT
CATH NAVICROSS ANGLED 90CM (MICROCATHETER) ×1 IMPLANT
CATH QUICKCROSS SUPP .035X90CM (MICROCATHETER) ×1 IMPLANT
CATH RETRIEVER CLOT 16MMX105CM (CATHETERS) ×1 IMPLANT
CATH VISIONS PV .035 IVUS (CATHETERS) ×1 IMPLANT
GLIDEWIRE ADV .035X260CM (WIRE) ×1 IMPLANT
KIT ENCORE 26 ADVANTAGE (KITS) ×1 IMPLANT
KIT MICROPUNCTURE NIT STIFF (SHEATH) ×1 IMPLANT
KIT PV (KITS) ×3 IMPLANT
SHEATH CLOT RETRIEVER (SHEATH) ×1 IMPLANT
SHEATH PINNACLE 5F 10CM (SHEATH) ×1 IMPLANT
SHEATH PINNACLE 8F 10CM (SHEATH) ×1 IMPLANT
SHEATH PROBE COVER 6X72 (BAG) ×1 IMPLANT
STENT VICI VENOUS 14X120 (Permanent Stent) ×1 IMPLANT
STENT VICI VENOUS 14X90 (Permanent Stent) ×1 IMPLANT
TRAY PV CATH (CUSTOM PROCEDURE TRAY) ×3 IMPLANT
WIRE AMPLATZ SS-J .035X260CM (WIRE) ×1 IMPLANT
WIRE BENTSON .035X145CM (WIRE) ×1 IMPLANT
WIRE TORQFLEX AUST .018X40CM (WIRE) ×1 IMPLANT

## 2019-05-26 NOTE — H&P (Signed)
   History and Physical Update  The patient was interviewed and re-examined.  The patient's previous History and Physical has been reviewed and is unchanged from recent office visit. Plan for left lower extremity venography with possible intervention.   Brittanyann Wittner C. Donzetta Matters, MD [facility] Office: [phone] Pager: [phone]   05/26/2019, 7:19 AM

## 2019-05-26 NOTE — Progress Notes (Signed)
During ambulation for c/d patient began bleeding from procedural site (left popliteal).  VSS and presure held for 5 minutes.  [name] saw patient and 2 hours bed rest will begin after hemostasis achieved.  Hemostasis achieved at 11:00

## 2019-05-26 NOTE — Op Note (Signed)
Patient name: [name] MRN: [id number] DOB: Oct 16, 1940 Sex: female  05/26/2019 Pre-operative Diagnosis: Post thrombotic syndrome left lower extremity Post-operative diagnosis:  Same Surgeon:  Erlene Quan C. Donzetta Matters, MD Procedure Performed: 1.  Ultrasound-guided cannulation left popliteal vein 2.  Left lower extremity venography 3.  Mechanical thrombectomy left common iliac, left external iliac, left common femoral vein and left femoral vein 4.  Balloon angioplasty of left femoral vein with 8 mm balloon 5.  Stent of left common iliac, left external iliac and left common femoral veins with central 14 x 120 Vici and peripheral 14 x 90 Vici 6.  Intravascular ultrasound of IVC, left common iliac, left external iliac, left common femoral and femoral veins 7.  Moderate sedation with fentanyl and Versed for 86 minutes   Indications: 78 year old female previously had IVC filter placement after having retroperitoneal hematoma that was spontaneous and found to have PE with significant thrombus in her left common and external leg veins.  She now has persistent swelling of the left lower extremity and the filter has been removed and she is indicated for the above-noted procedure.  Findings: The IVC was patent.  Left common iliac vein was occluded as was the central portion of the left external leg vein.  There was disease throughout the left external leg vein peripherally as well as the left common femoral vein.  The left femoral vein was occluded.  After balloon angioplasty of the left femoral vein there is now a channel.  Where there previously was occlusion of the left common and external iliac veins there is now diameter proximally and distally of 10 mm.  The profunda femoris was patent at the beginning remains patent at completion.  The saphenofemoral junction and greater saphenous vein in the thigh appear to be competent pre and post intervention.   Procedure:  The patient was identified in the  holding area and taken to room 8.  The patient was then placed prone on the operating table sterilely prepped and draped in the left lower extremity usual fashion.  Timeout was called.  Moderate sedation was administered and her vital signs were monitored throughout the course and she was maintained on oxygen therapy.  Ultrasound used for the popliteal vein.  This was minimally compressible did not appear disease.  There is anesthetized 1% lidocaine cannulated with micropuncture needle followed by wire and sheath.  I placed a Glidewire advantage then placed a 5 [place] sheath.  I was able to traverse all the occlusive disease.  I did have to perform lower extremity venography for a roadmap.  I had 1 area of perforation of the left common iliac vein but I was able to redirect centrally.  I placed a wire up into the SVC.  Patient at this time was given 5000 use of heparin.  An 8 French sheath was exchanged I performed intravascular ultrasound of the entirety from the IVC back to the femoral vein.  This time I elected to intervene.  I exchanged for a 14 [place] sheath for the Inari.  I attempted to place the Inari but this would not pass.  I then performed balloon angioplasty from the femoral vein all the way centrally.  An additional 5000 units of heparin was given.  We then 4 mechanical thrombectomy from the IVC all the way back to the femoral vein.  Unfortunately we returned only minimal chronic appearing thrombus despite 3 passes.  I then elected to stent centrally.  I used IVUS to identify the  IVC centrally.  I then stented with a 14 x 120 and extend this down to the common femoral vein with 14 x 90.  These were postdilated with 12 mm balloon.  Completion venography demonstrated the above findings with 10 mm lumens and was confirmed with recorded ivus. The wire was then removed and sheath pulled. Compression was held until hemostasis obtained.    Contrast: 70cc   Jaymee Tilson C. Donzetta Matters, MD [facility] Office: [phone] Pager: [phone]

## 2019-05-26 NOTE — Progress Notes (Signed)
No bleeding or swelling noted after ambulation 

## 2019-05-26 NOTE — Discharge Instructions (Signed)
Venogram, Care After This sheet gives you information about how to care for yourself after your procedure. Your health care provider may also give you more specific instructions. If you have problems or questions, contact your health care provider. What can I expect after the procedure? After the procedure, it is common to have:  Bruising or mild discomfort in the procedure area (insertion site). Follow these instructions at home: Eating and drinking   Follow instructions from your health care provider about eating or drinking restrictions.  Drink a lot of fluids for the first several days after the procedure, as directed by your health care provider. This helps to wash (flush) the contrast out of your body. Examples of healthy fluids include water or low-calorie drinks. General instructions  Check your IV insertion area every day for signs of infection. Check for: ? Redness, swelling, or pain. ? Fluid or blood. ? Warmth. ? Pus or a bad smell.  Take over-the-counter and prescription medicines only as told by your health care provider.  Rest and return to your normal activities as told by your health care provider. Ask your health care provider what activities are safe for you.  Do not drive for 24 hours if you were given a medicine to help you relax (sedative), or until your health care provider approves.  Keep all follow-up visits as told by your health care provider. This is important. Contact a health care provider if:  Your skin becomes itchy or you develop a rash or hives.  You have a fever that does not get better with medicine.  You feel nauseous.  You vomit.  You have redness, swelling, or pain around the insertion site.  You have fluid or blood coming from the insertion site.  Your insertion area feels warm to the touch.  You have pus or a bad smell coming from the insertion site. Get help right away if:  You have difficulty breathing or shortness of  breath.  You develop chest pain.  You faint.  You feel very dizzy. These symptoms may represent a serious problem that is an emergency. Do not wait to see if the symptoms will go away. Get medical help right away. Call your local emergency services (911 in the [place] Do not drive yourself to the hospital. Summary  After your procedure, it is common to have bruising or mild discomfort in the area where the IV was inserted.  You should check your IV insertion area every day for signs of infection.  Take over-the-counter and prescription medicines only as told by your health care provider.  You should drink a lot of fluids for the first several days after the procedure to help flush the contrast from your body. This information is not intended to replace advice given to you by your health care provider. Make sure you discuss any questions you have with your health care provider. Document Released: 03/05/2013 Document Revised: 04/27/2017 Document Reviewed: 04/08/2016 Elsevier Patient Education  2020 [employer].

## 2019-05-27 MED FILL — Lidocaine HCl Local Preservative Free (PF) Inj 1%: INTRAMUSCULAR | Qty: 30 | Status: AC

## 2019-05-28 DIAGNOSIS — J449 Chronic obstructive pulmonary disease, unspecified: Secondary | ICD-10-CM | POA: Diagnosis not present

## 2019-06-12 DIAGNOSIS — J441 Chronic obstructive pulmonary disease with (acute) exacerbation: Secondary | ICD-10-CM | POA: Diagnosis not present

## 2019-06-13 ENCOUNTER — Other Ambulatory Visit: Payer: Self-pay | Admitting: Internal Medicine

## 2019-06-28 DIAGNOSIS — J449 Chronic obstructive pulmonary disease, unspecified: Secondary | ICD-10-CM | POA: Diagnosis not present

## 2019-06-30 ENCOUNTER — Ambulatory Visit: Payer: PPO | Admitting: Internal Medicine

## 2019-06-30 ENCOUNTER — Encounter: Payer: Self-pay | Admitting: Internal Medicine

## 2019-06-30 ENCOUNTER — Other Ambulatory Visit: Payer: Self-pay

## 2019-06-30 VITALS — BP 140/82 | HR 76 | Temp 98.5°F | Ht 66.0 in | Wt 164.6 lb

## 2019-06-30 DIAGNOSIS — J441 Chronic obstructive pulmonary disease with (acute) exacerbation: Secondary | ICD-10-CM | POA: Diagnosis not present

## 2019-06-30 DIAGNOSIS — I2609 Other pulmonary embolism with acute cor pulmonale: Secondary | ICD-10-CM

## 2019-06-30 DIAGNOSIS — J449 Chronic obstructive pulmonary disease, unspecified: Secondary | ICD-10-CM

## 2019-06-30 MED ORDER — ALBUTEROL SULFATE (2.5 MG/3ML) 0.083% IN NEBU: 2.5000 mg | INHALATION_SOLUTION | Freq: Four times a day (QID) | RESPIRATORY_TRACT | 12 refills | Status: DC | PRN

## 2019-06-30 MED ORDER — METHYLPREDNISOLONE ACETATE 80 MG/ML IJ SUSP
80.0000 mg | Freq: Once | INTRAMUSCULAR | Status: AC
  Administered 2019-06-30: 14:00:00 80 mg via INTRAMUSCULAR

## 2019-06-30 NOTE — Assessment & Plan Note (Signed)
Mild exacerbation, She had previously tried Trelegy and prefers Advair/ Spiriva. I recommended prednisone taper but she didn't want pills- prefers depomedrol.

## 2019-06-30 NOTE — Patient Instructions (Signed)
Order- Depo 32     Dx COPD exacerbation  Refill script sent for nebulizer solution   Please call if we can help

## 2019-06-30 NOTE — Progress Notes (Signed)
Patient ID: [name], female    DOB: Aug 06, 1940, 79 y.o.   MRN: [id number]  HPI female former smoker followed for COPD, allergic rhinitis, complicated by GERD, chronic back pain Office Spirometry 12/16/13- Severe obstructive airways disease, FVC 2.13/ 65%, FEV1 1.08/ 44%, FEV1/FVC 0.51, FEF25-75% 0.47/ 22%. History of intense reaction shortly after receiving pneumonia vaccine, leading to hospitalization. She is considered "allergic" and will not take pneumonia vaccine Office Spirometry 06/22/2016-severe obstructive airways disease. FVC 2.44/78%, FEV1 1.04/44%, ratio 0.43, FEF 25-75% 0.39/21%  ----------------------------------------------------------------  12/26/2018- 79 year old female former smoker followed for COPD, allergic rhinitis, complicated by GERD, chronic back pain -----pt states breathing varies; currently taking Advair 1 puff BID, plus Spiriva, states she isn't sure if it helps her Can't tell inhalers help much. Spending time at pool, but not very active. Little cough. Neb does help used up to 2-3x daily if needed. Trelegy > thrush. Asks depo inj- discussed steroid side effects.  Daily maint keflex- UTI prophy.  06/30/19- 79 year old female former smoker followed for COPD, allergic rhinitis, complicated by GERD, chronic back pain, PE 01/28/2019/  DVT(L)/ Eliquis,   [facility] Dec 28 for thrombectomy L DVT. Neb albuterol, albuterol hfa, Advair 250, Spiriva 2.5 Respimat,  Had difficult Fall with PE, retroperitoneal bleed, IVC filter placed then removed, extensive DVT L leg/ thrombectomy. Now on Eliquis.  Increased wheeze x 2 weeks but denies sore throat, fever, purulent sputum. Asks for depo shot instead of prednisone- feels overwhelmed by number of pills she is taking now.  Has O2 from hospital stay, but trying to avoid using it to avoid dependence- education done. CTa chest 01/28/2019-  IMPRESSION: Extensive acute pulmonary emboli involving the lobar and segmental branches of all  pulmonary lobes. No main branch or saddle embolism. There is CT evidence of right heart strain.  ROS-see HPI    + = positive Constitutional:   No-   weight loss, night sweats, fevers, chills, fatigue, lassitude. HEENT:   No-  headaches, difficulty swallowing, tooth/dental problems, sore throat,       No- sneezing, itching, ear ache,nasal congestion, +post nasal drip,  CV:  No-   chest pain, orthopnea, PND, swelling in lower extremities, anasarca, dizziness, palpitations Resp: + shortness of breath with exertion or at rest.              No-   productive cough,  + non-productive cough,  No- coughing up of blood.              No-   change in color of mucus.  +occasional mild wheezing.   Skin: No-   rash or lesions. GI:  No-   heartburn, indigestion, abdominal pain, nausea, vomiting,  GU:  MS:  No-   joint pain or swelling. + Acute/chronic back pain Neuro-     nothing unusual Psych:  No- change in mood or affect. No depression or anxiety.  No memory loss.  OBJ- Physical Exam General- Alert, Oriented, Affect-appropriate, Distress- none acute Skin- rash-none, lesions- none, excoriation- none Lymphadenopathy- none Head- atraumatic            Eyes- Gross vision intact, PERRLA, conjunctivae and secretions clear            Ears- Hearing, canals-normal            Nose- Clear, no-Septal dev, mucus, polyps, erosion, perforation             Throat- Mallampati II , mucosa clear , drainage- none, tonsils- atrophic Neck- flexible ,  trachea midline, no stridor , thyroid nl, carotid no bruit Chest - symmetrical excursion , unlabored           Heart/CV- RRR , no murmur , no gallop  , no rub, nl s1 s2                           - JVD- none , edema- none, stasis changes- none, varices- none           Lung-  +distant, , cough- none, wheeze+ mild , dullness-none, rub- none           Chest wall-  Abd-  Br/ Gen/ Rectal- Not done, not indicated Extrem- cyanosis- none, clubbing, none, atrophy- none, strength-  nl Neuro- restless

## 2019-06-30 NOTE — Assessment & Plan Note (Signed)
Now on long-term Eliquis. No bleeding reported at this visit.

## 2019-07-11 ENCOUNTER — Other Ambulatory Visit: Payer: Self-pay

## 2019-07-11 DIAGNOSIS — I82402 Acute embolism and thrombosis of unspecified deep veins of left lower extremity: Secondary | ICD-10-CM

## 2019-07-14 ENCOUNTER — Other Ambulatory Visit: Payer: Self-pay

## 2019-07-14 ENCOUNTER — Ambulatory Visit (HOSPITAL_COMMUNITY)
Admission: RE | Admit: 2019-07-14 | Discharge: 2019-07-14 | Disposition: A | Discharge status: Home / Self Care | Payer: PPO | Source: Ambulatory Visit | Attending: Surgery | Admitting: Surgery

## 2019-07-14 DIAGNOSIS — I82402 Acute embolism and thrombosis of unspecified deep veins of left lower extremity: Secondary | ICD-10-CM | POA: Diagnosis not present

## 2019-07-18 ENCOUNTER — Other Ambulatory Visit: Payer: Self-pay

## 2019-07-18 ENCOUNTER — Encounter (HOSPITAL_COMMUNITY): Payer: PPO

## 2019-07-18 ENCOUNTER — Encounter: Payer: Self-pay | Admitting: Vascular Surgery

## 2019-07-18 ENCOUNTER — Ambulatory Visit (INDEPENDENT_AMBULATORY_CARE_PROVIDER_SITE_OTHER): Payer: PPO | Admitting: Vascular Surgery

## 2019-07-18 VITALS — BP 138/66 | HR 85 | Temp 97.5°F | Resp 20 | Ht 66.0 in | Wt 166.7 lb

## 2019-07-18 DIAGNOSIS — Z95828 Presence of other vascular implants and grafts: Secondary | ICD-10-CM | POA: Diagnosis not present

## 2019-07-18 DIAGNOSIS — I82402 Acute embolism and thrombosis of unspecified deep veins of left lower extremity: Secondary | ICD-10-CM

## 2019-07-18 NOTE — Progress Notes (Signed)
Patient ID: [name], female   DOB: 1941-02-27, 79 y.o.   MRN: [id number]  Reason for Consult: Post-op Follow-up   Referred by Deland Pretty, MD  Subjective:     HPI:  [name] is a 79 y.o. female history of IVC filter placement for extensive left lower extremity DVT at the time of retroperitoneal hematoma.  She is now had the filter removed has undergone left lower extremity venography with mechanical thrombectomy stenting of the left common and external iliac and common femoral vein on the left.  Swelling is now significantly improved.  Does not have issues with tissue loss or ulceration.  States she is walking without limitation at this time.  She does have calf cramping in the middle of the night this is bilaterally.  She is taking her anticoagulation as scheduled.  Has a scheduled trip to [place] in the next couple months.  Past Medical History:  Diagnosis Date  . Allergic rhinitis   . Asthma   . Chronic airway obstruction, not elsewhere classified   . Depressive disorder, not elsewhere classified   . Esophageal reflux    Family History  Problem Relation Age of Onset  . Diabetes Mother   . Coronary artery disease Mother   . Emphysema Father   . Coronary artery disease Brother    Past Surgical History:  Procedure Laterality Date  . ABDOMINAL HYSTERECTOMY    . CYSTOSCOPY    . INTRAVASCULAR ULTRASOUND/IVUS Left 05/26/2019   Procedure: Intravascular Ultrasound/IVUS;  Surgeon: Waynetta Sandy, MD;  Location: [facility];  Service: Cardiovascular;  Laterality: Left;  . IVC FILTER REMOVAL N/A 03/24/2019   Procedure: IVC FILTER REMOVAL;  Surgeon: Waynetta Sandy, MD;  Location: [facility];  Service: Cardiovascular;  Laterality: N/A;  . LOWER EXTREMITY VENOGRAPHY Left 05/26/2019   Procedure: LOWER EXTREMITY VENOGRAPHY;  Surgeon: Waynetta Sandy, MD;  Location: [place] [facility];  Service: Cardiovascular;  Laterality: Left;  .  NASAL SINUS SURGERY    . PERIPHERAL VASCULAR INTERVENTION Left 05/26/2019   Procedure: PERIPHERAL VASCULAR INTERVENTION;  Surgeon: Waynetta Sandy, MD;  Location: [place] [facility];  Service: Cardiovascular;  Laterality: Left;  lower extremity veins  . PERIPHERAL VASCULAR THROMBECTOMY  05/26/2019   Procedure: PERIPHERAL VASCULAR THROMBECTOMY;  Surgeon: Waynetta Sandy, MD;  Location: [place] [facility];  Service: Cardiovascular;;  . VENA CAVA FILTER PLACEMENT N/A 01/13/2019   Procedure: INSERTION VENA-CAVA FILTER;  Surgeon: Waynetta Sandy, MD;  Location: [place];  Service: Vascular;  Laterality: N/A;    Short Social History:  Social History   Tobacco Use  . Smoking status: Former Smoker    Packs/day: 0.25    Years: 40.00    Pack years: 10.00    Types: Cigarettes    Quit date: 05/27/2013    Years since quitting: 6.1  . Smokeless tobacco: Never Used  Substance Use Topics  . Alcohol use: No    Allergies  Allergen Reactions  . Clarithromycin Swelling  . Penicillins Swelling    Tongue swelling Did it involve swelling of the face/tongue/throat, SOB, or low BP? Yes Did it involve sudden or severe rash/hives, skin peeling, or any reaction on the inside of your mouth or nose? Yes Did you need to seek medical attention at a hospital or doctor's office? Yes When did it last happen?childhood If all above answers are "NO", may proceed with cephalosporin use.   . Pneumococcal Vaccines Other (See Comments)  Couldn't breathe  . Sulfonamide Derivatives Swelling  . Estradiol Rash    Cream gives her a rash    Current Outpatient Medications  Medication Sig Dispense Refill  . albuterol (PROVENTIL) (2.5 MG/3ML) 0.083% nebulizer solution Take 3 mLs (2.5 mg total) by nebulization every 6 (six) hours as needed for wheezing or shortness of breath. 75 mL 12  . albuterol (VENTOLIN HFA) 108 (90 Base) MCG/ACT inhaler Inhale 1-2 puffs into the lungs every 6 (six) hours  as needed for wheezing or shortness of breath.    Marland Kitchen apixaban (ELIQUIS) 5 MG TABS tablet Take 5 mg by mouth 2 (two) times daily.    Marland Kitchen aspirin EC 81 MG EC tablet Take 1 tablet (81 mg total) by mouth daily. 30 tablet 0  . cephALEXin (KEFLEX) 250 MG capsule Take 250 mg by mouth daily.    . Fluticasone-Salmeterol (ADVAIR) 250-50 MCG/DOSE AEPB INHALE 1 DOSE BY MOUTH TWICE DAILY RINSE  MOUTH  AFTER  USE 60 each 5  . LORazepam (ATIVAN) 1 MG tablet Take 0.5-1 mg by mouth 2 (two) times daily as needed for anxiety or sleep.     Marland Kitchen omeprazole (PRILOSEC) 10 MG capsule Take 10 mg by mouth daily.     . potassium chloride (KLOR-CON) 8 MEQ tablet Take 8 mEq by mouth daily.    . Tiotropium Bromide Monohydrate (SPIRIVA RESPIMAT) 2.5 MCG/ACT AERS Inhale 1 puff into the lungs daily with lunch.     . traZODone (DESYREL) 100 MG tablet Take 100 mg by mouth at bedtime.     . triamterene-hydrochlorothiazide (MAXZIDE-25) 37.5-25 MG tablet Take 0.5 tablets by mouth daily.     No current facility-administered medications for this visit.    Review of Systems  Constitutional:  Constitutional negative. HENT: HENT negative.  Eyes: Eyes negative.  Respiratory: Respiratory negative.  Cardiovascular: Cardiovascular negative.  GI: Gastrointestinal negative.  Musculoskeletal:       Calf cramping Skin: Skin negative.  Neurological: Neurological negative. Hematologic: Hematologic/lymphatic negative.  Psychiatric: Psychiatric negative.        Objective:  Objective   Vitals:   07/18/19 1445  BP: 138/66  Pulse: 85  Resp: 20  Temp: (!) 97.5 F (36.4 C)  SpO2: 92%  Weight: 166 lb 11.2 oz (75.6 kg)  Height: 5\' 6"  (1.676 m)   Body mass index is 26.91 kg/m.  Physical Exam HENT:     Head: Normocephalic.     Nose:     Comments: Mask in place Eyes:     Pupils: Pupils are equal, round, and reactive to light.  Cardiovascular:     Rate and Rhythm: Normal rate.     Pulses:          Popliteal pulses are 2+ on the  right side and 2+ on the left side.       Dorsalis pedis pulses are 2+ on the right side and 2+ on the left side.  Pulmonary:     Effort: Pulmonary effort is normal.  Abdominal:     General: Abdomen is flat.     Palpations: Abdomen is soft. There is no mass.  Musculoskeletal:        General: No swelling.  Skin:    General: Skin is warm and dry.     Capillary Refill: Capillary refill takes less than 2 seconds.  Neurological:     General: No focal deficit present.     Mental Status: She is alert.  Psychiatric:        Mood  and Affect: Mood normal.        Thought Content: Thought content normal.        Judgment: Judgment normal.     Data: Study Result  IVC/ILIAC STUDY   Indications: Surgery date 05/26/2019.   Risk Factors: Past history of smoking.   Vascular Interventions: Insertion of IVC filter 01/13/2019. IVC removal             03/24/2019.             Left common iliac, external iliac and common  femoral             vein stenting 05/26/2019.   Limitations: Air/bowel gas.     Performing Technologist: Ronal Fear RVS, RCS     Examination Guidelines: A complete evaluation includes B-mode imaging,  spectral  Doppler, color Doppler, and power Doppler as needed of all accessible  portions  of each vessel. Bilateral testing is considered an integral part of a  complete  examination. Limited examinations for reoccurring indications may be  performed  as noted.     +-------------------+---------+-----------+---------+-----------+--------+      CIV    RT-PatentRT-ThrombusLT-PatentLT-ThrombusComments  +-------------------+---------+-----------+---------+-----------+--------+  Common Iliac Prox             patent        stent   +-------------------+---------+-----------+---------+-----------+--------+  Common Iliac Mid              patent        stent     +-------------------+---------+-----------+---------+-----------+--------+  Common Iliac Distal            patent        stent   +-------------------+---------+-----------+---------+-----------+--------+      +-------------------------+---------+-----------+---------+-----------+----  ----+        EIV       RT-PatentRT-ThrombusLT-PatentLT-ThrombusComments  +-------------------------+---------+-----------+---------+-----------+----  ----+  External Iliac Vein Prox             patent         stent   +-------------------------+---------+-----------+---------+-----------+----  ----+  External Iliac Vein Mid             patent         stent   +-------------------------+---------+-----------+---------+-----------+----  ----+  External Iliac Vein               patent         stent   Distal                                       +-------------------------+---------+-----------+---------+-----------+----  ----+           Summary:  IVC/Iliac: There is no evidence of thrombus involving the left common  iliac vein stent. There is no evidence of thrombus involving the left  external iliac vein stent. Chronic thrombus observed in the left common  femoral vein.           Assessment/Plan:     79 year old female status post left common and external and common femoral vein stenting for May Thurner.  These are patent by today's study.  There is chronic thrombus in the left common femoral vein this was noted at the time of venography.  She does have some spider veins and varicosities in the left ankle and calf.  This is consistent with C2 venous disease.  Most of her swelling has resolved.  We will get reflux studies repeated now that we have treated her central  stenosis.  She is going to take a trip to [place] I have recommended continue  anticoagulation and remaining very hydrated and walking every 2 hours while on her very long flight.  She will follow-up after the flight with reflux testing.     Waynetta Sandy MD [facility]

## 2019-07-21 ENCOUNTER — Other Ambulatory Visit: Payer: Self-pay | Admitting: *Deleted

## 2019-07-21 DIAGNOSIS — Z95828 Presence of other vascular implants and grafts: Secondary | ICD-10-CM

## 2019-07-21 DIAGNOSIS — I82402 Acute embolism and thrombosis of unspecified deep veins of left lower extremity: Secondary | ICD-10-CM

## 2019-07-23 DIAGNOSIS — Z86718 Personal history of other venous thrombosis and embolism: Secondary | ICD-10-CM | POA: Diagnosis not present

## 2019-07-23 DIAGNOSIS — N905 Atrophy of vulva: Secondary | ICD-10-CM | POA: Diagnosis not present

## 2019-07-23 DIAGNOSIS — N390 Urinary tract infection, site not specified: Secondary | ICD-10-CM | POA: Diagnosis not present

## 2019-07-23 DIAGNOSIS — Z09 Encounter for follow-up examination after completed treatment for conditions other than malignant neoplasm: Secondary | ICD-10-CM | POA: Diagnosis not present

## 2019-07-23 DIAGNOSIS — N398 Other specified disorders of urinary system: Secondary | ICD-10-CM | POA: Diagnosis not present

## 2019-07-27 DIAGNOSIS — J449 Chronic obstructive pulmonary disease, unspecified: Secondary | ICD-10-CM | POA: Diagnosis not present

## 2019-07-28 ENCOUNTER — Inpatient Hospital Stay: Payer: PPO

## 2019-07-28 ENCOUNTER — Inpatient Hospital Stay: Payer: PPO | Attending: Hematology | Admitting: Hematology

## 2019-07-28 ENCOUNTER — Other Ambulatory Visit: Payer: Self-pay

## 2019-07-28 VITALS — BP 176/88 | HR 76 | Temp 98.7°F | Resp 18 | Ht 66.0 in | Wt 166.7 lb

## 2019-07-28 DIAGNOSIS — Z7901 Long term (current) use of anticoagulants: Secondary | ICD-10-CM | POA: Insufficient documentation

## 2019-07-28 DIAGNOSIS — I82422 Acute embolism and thrombosis of left iliac vein: Secondary | ICD-10-CM

## 2019-07-28 DIAGNOSIS — D649 Anemia, unspecified: Secondary | ICD-10-CM | POA: Diagnosis not present

## 2019-07-28 DIAGNOSIS — Z86711 Personal history of pulmonary embolism: Secondary | ICD-10-CM | POA: Diagnosis not present

## 2019-07-28 DIAGNOSIS — I2609 Other pulmonary embolism with acute cor pulmonale: Secondary | ICD-10-CM

## 2019-07-28 DIAGNOSIS — I82512 Chronic embolism and thrombosis of left femoral vein: Secondary | ICD-10-CM | POA: Insufficient documentation

## 2019-07-28 DIAGNOSIS — R58 Hemorrhage, not elsewhere classified: Secondary | ICD-10-CM

## 2019-07-28 LAB — CMP (CANCER CENTER ONLY)
ALT: 12 U/L (ref 0–44)
AST: 15 U/L (ref 15–41)
Albumin: 3.8 g/dL (ref 3.5–5.0)
Alkaline Phosphatase: 55 U/L (ref 38–126)
Anion gap: 7 (ref 5–15)
BUN: 17 mg/dL (ref 8–23)
CO2: 28 mmol/L (ref 22–32)
Calcium: 8.5 mg/dL — ABNORMAL LOW (ref 8.9–10.3)
Chloride: 108 mmol/L (ref 98–111)
Creatinine: 0.8 mg/dL (ref 0.44–1.00)
GFR, Est AFR Am: >60 mL/min (ref >60)
GFR, Estimated: >60 mL/min (ref >60)
Glucose, Bld: 87 mg/dL (ref 70–99)
Potassium: 4 mmol/L (ref 3.5–5.1)
Sodium: 143 mmol/L (ref 135–145)
Total Bilirubin: 0.6 mg/dL (ref 0.3–1.2)
Total Protein: 6.5 g/dL (ref 6.5–8.1)

## 2019-07-28 LAB — CBC WITH DIFFERENTIAL/PLATELET
Abs Immature Granulocytes: 0.01 10*3/uL (ref 0.00–0.07)
Basophils Absolute: 0 10*3/uL (ref 0.0–0.1)
Basophils Relative: 1 %
Eosinophils Absolute: 0.1 10*3/uL (ref 0.0–0.5)
Eosinophils Relative: 1 %
HCT: 32.6 % — ABNORMAL LOW (ref 36.0–46.0)
Hemoglobin: 9.7 g/dL — ABNORMAL LOW (ref 12.0–15.0)
Immature Granulocytes: 0 %
Lymphocytes Relative: 28 %
Lymphs Abs: 1.5 10*3/uL (ref 0.7–4.0)
MCH: 24.3 pg — ABNORMAL LOW (ref 26.0–34.0)
MCHC: 29.8 g/dL — ABNORMAL LOW (ref 30.0–36.0)
MCV: 81.7 fL (ref 80.0–100.0)
Monocytes Absolute: 0.5 10*3/uL (ref 0.1–1.0)
Monocytes Relative: 9 %
Neutro Abs: 3.3 10*3/uL (ref 1.7–7.7)
Neutrophils Relative %: 61 %
Platelets: 339 10*3/uL (ref 150–400)
RBC: 3.99 MIL/uL (ref 3.87–5.11)
RDW: 14.4 % (ref 11.5–15.5)
WBC: 5.5 10*3/uL (ref 4.0–10.5)
nRBC: 0 % (ref 0.0–0.2)

## 2019-07-28 LAB — FERRITIN: Ferritin: <4 ng/mL — ABNORMAL LOW (ref 11–307)

## 2019-07-28 NOTE — Progress Notes (Signed)
HEMATOLOGY/ONCOLOGY CLINIC NOTE  Date of Service: 03/13/2019  Patient Care Team: Deland Pretty, MD as PCP - General (Internal Medicine)  REFERRING PHYSICIAN: Deland Pretty, MD  CHIEF COMPLAINTS/PURPOSE OF CONSULTATION:  Spontaneous retroperitoneal bleed, DVT, Pulmonary Embolism    HISTORY OF PRESENTING ILLNESS:   [name] is a wonderful 79 y.o. female who has been referred to [place] by Deland Pretty, MD for evaluation and management of Acute Pulmonary Embolism.The pt reports that she is doing well overall.   The pt reports that she was in the emergency room on 8/14 then again on the 8/17. The first time she went to the Emergency room her leg was swollen that Friday by the weekend her leg was even more enlarged. @-3 months before leg swelling there was no other symptoms , no new medications, no long distance travel, no injury she did have surgery 06/04/2017.Left: Findings consistent with acute deep vein thrombosis involving the left common femoral vein, left femoral vein, left proximal profunda vein, left popliteal vein, left posterior tibial veins, left peroneal veins, and left gastrocnemius veins.  Findings consistent with acute superficial vein thrombosis involving the left small saphenous vein. No cystic structure found in the popliteal fossa. [place] LE 8/17 showed Left: Findings consistent with acute deep vein thrombosis involving the left common femoral vein, left femoral vein, left proximal profunda vein, left popliteal vein, left posterior tibial veins, left peroneal veins, and left gastrocnemius veins.  Findings consistent with acute superficial vein thrombosis involving the left small saphenous vein. No cystic structure found in the popliteal fossa.  She was admitted 01/13/2019 with a retroperitoneal bleed and had IVC filter placed and anticoagulation held. Of note prior to the patient's visit today, pt has had CT ANGIOGRAPHY OF ABDOMINAL AORTA WITH ILIOFEMORAL RUNOFF completed on  01/13/2019 with results revealing "Left-sided retroperitoneal hemorrhage with no evidence of active extravasation. The greatest diameter is estimated 8.3 cm, with the majority of hemorrhage  CT demonstrates left-sided DVT, involving left common iliac vein, internal iliac veins, external iliac vein, common femoral vein, femoral vein, popliteal vein, and possibly tibial veins. Correlation with duplex might be useful."  Of note prior to the patient's visit today, pt has had CT Angio Chest PE W or WO Contrast completed on 01/28/2019 with results revealing "Extensive acute pulmonary emboli involving the lobar and segmental branches of all pulmonary lobes. No main branch or saddle embolism.There is CT evidence of right heart strain."   She is on Eliquis for 6 weeks. -She has an appointment to have the filter removed October 26th  -Pt started oxygen after the discovery of clots in lungs.   Most recent lab results (02/25/2019) of CBC is as follows: all values are WNL except for Glucose at 106, Creatinine Ser at 1.04, GFR non calc Af Amer at 53, Platelets at 417.  On review of systems, pt reports leg swelling, discoloration in left leg and denies previous blood clots and any other symptoms.   On PMHx the pt reports 06/04/2017 Posterior Colporrhaphy  On Social Hx the pt reports previous smoker, hasn't smoked in 15 years   INTERVAL HISTORY:   [name] is a wonderful 79 y.o. female who is here for evaluation and management of Acute Pulmonary Embolism. The patient's last visit with [place] was on 03/26/2019. The pt reports that she is doing well overall.  The pt reports that she had an IVC Filter Removal in October just before we saw her last and had two venous stents  placed in December for rx of may thurner syndrome. Pt will see the vascular surgeon again in April. She did have some bleeding with her procedure in December. When she walked for the first time after the procedure she bled from the access  point. She denies any current back pain or bloody/black stools. Her leg swelling has nearly resolved. Pt is currently taking a Potassium supplement. She has had her first dose of the COVID19 vaccine.   Of note since the patient's last visit, pt has had VAS [place] IVC/ILIAC (VENOUS ONLY) ([id number]) completed on 07/14/2019 with results revealing "There is no evidence of thrombus involving the left common iliac vein stent. There is no evidence of thrombus involving the left external iliac vein stent. Chronic thrombus observed in the left common femoral vein."  Lab results today (07/28/19) of CBC w/diff and CMP is as follows: all values are WNL except for Hgb at 9.7, HCT at 32.6, MCH at 24.3, MCHC at 29.8, Calcium at 8.5. 07/28/2019 Ferritin at <4  On review of systems, pt reports bruising and denies nose bleeds, gum bleeds, bloody/black stools and any other symptoms.   MEDICAL HISTORY:  Past Medical History:  Diagnosis Date  . Allergic rhinitis   . Asthma   . Chronic airway obstruction, not elsewhere classified   . Depressive disorder, not elsewhere classified   . Esophageal reflux      SURGICAL HISTORY: Past Surgical History:  Procedure Laterality Date  . ABDOMINAL HYSTERECTOMY    . CYSTOSCOPY    . INTRAVASCULAR ULTRASOUND/IVUS Left 05/26/2019   Procedure: Intravascular Ultrasound/IVUS;  Surgeon: Waynetta Sandy, MD;  Location: [facility];  Service: Cardiovascular;  Laterality: Left;  . IVC FILTER REMOVAL N/A 03/24/2019   Procedure: IVC FILTER REMOVAL;  Surgeon: Waynetta Sandy, MD;  Location: [facility];  Service: Cardiovascular;  Laterality: N/A;  . LOWER EXTREMITY VENOGRAPHY Left 05/26/2019   Procedure: LOWER EXTREMITY VENOGRAPHY;  Surgeon: Waynetta Sandy, MD;  Location: [facility];  Service: Cardiovascular;  Laterality: Left;  . NASAL SINUS SURGERY    . PERIPHERAL VASCULAR INTERVENTION Left 05/26/2019   Procedure: PERIPHERAL  VASCULAR INTERVENTION;  Surgeon: Waynetta Sandy, MD;  Location: [facility];  Service: Cardiovascular;  Laterality: Left;  lower extremity veins  . PERIPHERAL VASCULAR THROMBECTOMY  05/26/2019   Procedure: PERIPHERAL VASCULAR THROMBECTOMY;  Surgeon: Waynetta Sandy, MD;  Location: [facility];  Service: Cardiovascular;;  . VENA CAVA FILTER PLACEMENT N/A 01/13/2019   Procedure: INSERTION VENA-CAVA FILTER;  Surgeon: Waynetta Sandy, MD;  Location: [facility];  Service: Vascular;  Laterality: N/A;     SOCIAL HISTORY: Social History   Socioeconomic History  . Marital status: Divorced    Spouse name: Not on file  . Number of children: Not on file  . Years of education: Not on file  . Highest education level: Not on file  Occupational History  . Occupation: Disabled  Tobacco Use  . Smoking status: Former Smoker    Packs/day: 0.25    Years: 40.00    Pack years: 10.00    Types: Cigarettes    Quit date: 05/27/2013    Years since quitting: 6.1  . Smokeless tobacco: Never Used  Substance and Sexual Activity  . Alcohol use: No  . Drug use: No  . Sexual activity: Not on file  Other Topics Concern  . Not on file  Social History Narrative  . Not on file   Social  Determinants of Health   Financial Resource Strain:   . Difficulty of Paying Living Expenses: Not on file  Food Insecurity:   . Worried About Charity fundraiser in the Last Year: Not on file  . Ran Out of Food in the Last Year: Not on file  Transportation Needs:   . Lack of Transportation (Medical): Not on file  . Lack of Transportation (Non-Medical): Not on file  Physical Activity:   . Days of Exercise per Week: Not on file  . Minutes of Exercise per Session: Not on file  Stress:   . Feeling of Stress : Not on file  Social Connections:   . Frequency of Communication with Friends and Family: Not on file  . Frequency of Social Gatherings with Friends and Family: Not on file  .  Attends Religious Services: Not on file  . Active Member of Clubs or Organizations: Not on file  . Attends Archivist Meetings: Not on file  . Marital Status: Not on file  Intimate Partner Violence:   . Fear of Current or Ex-Partner: Not on file  . Emotionally Abused: Not on file  . Physically Abused: Not on file  . Sexually Abused: Not on file     FAMILY HISTORY: Family History  Problem Relation Age of Onset  . Diabetes Mother   . Coronary artery disease Mother   . Emphysema Father   . Coronary artery disease Brother      ALLERGIES:   is allergic to clarithromycin; penicillins; pneumococcal vaccines; sulfonamide derivatives; and estradiol.   MEDICATIONS:  Current Outpatient Medications  Medication Sig Dispense Refill  . albuterol (PROVENTIL) (2.5 MG/3ML) 0.083% nebulizer solution Take 3 mLs (2.5 mg total) by nebulization every 6 (six) hours as needed for wheezing or shortness of breath. 75 mL 12  . albuterol (VENTOLIN HFA) 108 (90 Base) MCG/ACT inhaler Inhale 1-2 puffs into the lungs every 6 (six) hours as needed for wheezing or shortness of breath.    Marland Kitchen apixaban (ELIQUIS) 5 MG TABS tablet Take 5 mg by mouth 2 (two) times daily.    Marland Kitchen aspirin EC 81 MG EC tablet Take 1 tablet (81 mg total) by mouth daily. 30 tablet 0  . Fluticasone-Salmeterol (ADVAIR) 250-50 MCG/DOSE AEPB INHALE 1 DOSE BY MOUTH TWICE DAILY RINSE  MOUTH  AFTER  USE 60 each 5  . LORazepam (ATIVAN) 1 MG tablet Take 0.5-1 mg by mouth 2 (two) times daily as needed for anxiety or sleep.     Marland Kitchen omeprazole (PRILOSEC) 10 MG capsule Take 10 mg by mouth daily.     . potassium chloride (KLOR-CON) 8 MEQ tablet Take 8 mEq by mouth daily.    . Tiotropium Bromide Monohydrate (SPIRIVA RESPIMAT) 2.5 MCG/ACT AERS Inhale 1 puff into the lungs daily with lunch.     . traZODone (DESYREL) 100 MG tablet Take 100 mg by mouth at bedtime.     . triamterene-hydrochlorothiazide (MAXZIDE-25) 37.5-25 MG tablet Take 0.5 tablets by  mouth daily.    . cephALEXin (KEFLEX) 250 MG capsule Take 250 mg by mouth daily.     No current facility-administered medications for this visit.     REVIEW OF SYSTEMS:   A 10+ POINT REVIEW OF SYSTEMS WAS OBTAINED including neurology, dermatology, psychiatry, cardiac, respiratory, lymph, extremities, GI, GU, Musculoskeletal, constitutional, breasts, reproductive, HEENT.  All pertinent positives are noted in the HPI.  All others are negative.   PHYSICAL EXAMINATION: ECOG PERFORMANCE STATUS: 2 - Symptomatic, <50% confined to bed  Vitals:   07/28/19 1028  BP: (!) 176/88  Pulse: 76  Resp: 18  Temp: 98.7 F (37.1 C)  SpO2: 96%   Filed Weights   07/28/19 1028  Weight: 166 lb 11.2 oz (75.6 kg)   Body mass index is 26.91 kg/m.  Exam was given in a chair   GENERAL:alert, in no acute distress and comfortable SKIN: no acute rashes, no significant lesions EYES: conjunctiva are pink and non-injected, sclera anicteric OROPHARYNX: MMM, no exudates, no oropharyngeal erythema or ulceration NECK: supple, no JVD LYMPH:  no palpable lymphadenopathy in the cervical, axillary or inguinal regions LUNGS: clear to auscultation b/l with normal respiratory effort HEART: regular rate & rhythm ABDOMEN:  normoactive bowel sounds , non tender, not distended. No palpable hepatosplenomegaly.  Extremity: no pedal edema right, 2+ pedal edema left PSYCH: alert & oriented x 3 with fluent speech NEURO: no focal motor/sensory deficits  LABORATORY DATA:  I have reviewed the data as listed  CBC Latest Ref Rng & Units 07/28/2019 03/13/2019 02/25/2019  WBC 4.0 - 10.5 K/uL 5.5 7.0 6.4  Hemoglobin 12.0 - 15.0 g/dL 9.7(L) 14.6 13.9  Hematocrit 36.0 - 46.0 % 32.6(L) 46.0 44.7  Platelets 150 - 400 K/uL 339 413(H) 417(H)    CMP Latest Ref Rng & Units 07/28/2019 03/13/2019 02/25/2019  Glucose 70 - 99 mg/dL 87 92 106(H)  BUN 8 - 23 mg/dL 17 15 15   Creatinine 0.44 - 1.00 mg/dL 0.80 1.21(H) 1.04(H)  Sodium 135 - 145  mmol/L 143 141 137  Potassium 3.5 - 5.1 mmol/L 4.0 4.1 4.4  Chloride 98 - 111 mmol/L 108 103 101  CO2 22 - 32 mmol/L 28 29 25   Calcium 8.9 - 10.3 mg/dL 8.5(L) 9.2 9.0  Total Protein 6.5 - 8.1 g/dL 6.5 7.4 -  Total Bilirubin 0.3 - 1.2 mg/dL 0.6 0.6 -  Alkaline Phos 38 - 126 U/L 55 65 -  AST 15 - 41 U/L 15 20 -  ALT 0 - 44 U/L 12 14 -   03/13/2019 JAK2 Sequencing:    01/28/2019: ([id number]) CT Angio Chest PE W or WO Contrast  01/13/2019:([id number])  CT ANGIOGRAPHY OF ABDOMINAL AORTA WITH ILIOFEMORAL RUNOFF   RADIOGRAPHIC STUDIES: I have personally reviewed the radiological images as listed and agreed with the findings in the report.  VAS [place] IVC/ILIAC (VENOUS ONLY)  Result Date: 07/15/2019 IVC/ILIAC STUDY Indications: Surgery date 05/26/2019. Risk Factors: Past history of smoking. Vascular Interventions: Insertion of IVC filter 01/13/2019. IVC removal                         03/24/2019.                         Left common iliac, external iliac and common femoral                         vein stenting 05/26/2019. Limitations: Air/bowel gas.  Performing Technologist: Ronal Fear RVS, RCS  Examination Guidelines: A complete evaluation includes B-mode imaging, spectral Doppler, color Doppler, and power Doppler as needed of all accessible portions of each vessel. Bilateral testing is considered an integral part of a complete examination. Limited examinations for reoccurring indications may be performed as noted.  +-------------------+---------+-----------+---------+-----------+--------+         CIV        RT-PatentRT-ThrombusLT-PatentLT-ThrombusComments +-------------------+---------+-----------+---------+-----------+--------+ Common Iliac Prox  patent              stent   +-------------------+---------+-----------+---------+-----------+--------+ Common Iliac Mid                        patent              stent    +-------------------+---------+-----------+---------+-----------+--------+ Common Iliac Distal                     patent              stent   +-------------------+---------+-----------+---------+-----------+--------+  +-------------------------+---------+-----------+---------+-----------+--------+            EIV           RT-PatentRT-ThrombusLT-PatentLT-ThrombusComments +-------------------------+---------+-----------+---------+-----------+--------+ External Iliac Vein Prox                      patent              stent   +-------------------------+---------+-----------+---------+-----------+--------+ External Iliac Vein Mid                       patent              stent   +-------------------------+---------+-----------+---------+-----------+--------+ External Iliac Vein                           patent              stent   Distal                                                                    +-------------------------+---------+-----------+---------+-----------+--------+   Summary: IVC/Iliac: There is no evidence of thrombus involving the left common iliac vein stent. There is no evidence of thrombus involving the left external iliac vein stent. Chronic thrombus observed in the left common femoral vein.  *See table(s) above for measurements and observations.  Electronically signed by Curt Jews MD on 07/15/2019 at 2:18:37 PM.    Final      ASSESSMENT & PLAN:   [name] is a 79 y.o. female with:  1. Left lower etremity DVT 03/13/2019 JAK2 sequencing revealed "No mutations" 2. Extensive bilateral PE 3. Retroperitoneal bleed - spontaneous 4. S/p IVC filter  PLAN: -Discussed pt labwork today, 07/28/19; all values are WNL except for Hgb coming down at 9.7, HCT at 32.6, MCH at 24.3, MCHC at 29.8, Calcium at 8.5. -Discussed 07/28/2019 Ferritin is very low at <4  -Discussed 07/14/2019 VAS [place] IVC/ILIAC (VENOUS ONLY) ([id number]) which revealed "There is  no evidence of thrombus involving the left common iliac vein stent. There is no evidence of thrombus involving the left external iliac vein stent. Chronic thrombus observed in the left common femoral vein." -Would consider moving pt to a preventive dose of Eliquis if repeat US has no significant findings and labs stable in a 6 months -Recommend pt continue to use compression socks and move about every hour during her travels.  -Continue Eliquis as prescribed -Recommend pt connect with Dr. Shelia Media in 2 months with labst. -Recommend pt f/u with [name] as scheduled -Will see back in 6 months with labs   FOLLOW UP F/u  with Dr Shelia Media for labs in 2 months RTC with Dr Irene Limbo with labs in 6 months   The total time spent in the appt was 20 minutes and more than 50% was on counseling and direct patient cares.  All of the patient's questions were answered with apparent satisfaction. The patient knows to call the clinic with any problems, questions or concerns.   Sullivan Lone MD [place] AAHIVMS [facility]  (Office):       [phone] (Work cell):  [phone] (Fax):           [phone]  07/28/2019 1:13 PM  I, Yevette Edwards, am acting as a scribe for Dr. Sullivan Lone.   .I have reviewed the above documentation for accuracy and completeness, and I agree with the above. Brunetta Genera MD

## 2019-07-30 ENCOUNTER — Telehealth: Payer: Self-pay | Admitting: Hematology

## 2019-07-30 NOTE — Telephone Encounter (Signed)
Scheduled per 03/01 los, patient has been called and notified. ?

## 2019-08-11 ENCOUNTER — Encounter: Payer: Self-pay | Admitting: Internal Medicine

## 2019-08-11 DIAGNOSIS — D500 Iron deficiency anemia secondary to blood loss (chronic): Secondary | ICD-10-CM | POA: Diagnosis not present

## 2019-08-11 DIAGNOSIS — R5383 Other fatigue: Secondary | ICD-10-CM | POA: Diagnosis not present

## 2019-08-11 DIAGNOSIS — D649 Anemia, unspecified: Secondary | ICD-10-CM | POA: Diagnosis not present

## 2019-08-11 DIAGNOSIS — Z7901 Long term (current) use of anticoagulants: Secondary | ICD-10-CM | POA: Diagnosis not present

## 2019-08-11 DIAGNOSIS — Z86711 Personal history of pulmonary embolism: Secondary | ICD-10-CM | POA: Diagnosis not present

## 2019-08-11 DIAGNOSIS — Z1212 Encounter for screening for malignant neoplasm of rectum: Secondary | ICD-10-CM | POA: Diagnosis not present

## 2019-08-11 DIAGNOSIS — D509 Iron deficiency anemia, unspecified: Secondary | ICD-10-CM | POA: Diagnosis not present

## 2019-08-12 DIAGNOSIS — R195 Other fecal abnormalities: Secondary | ICD-10-CM | POA: Diagnosis not present

## 2019-08-12 DIAGNOSIS — D500 Iron deficiency anemia secondary to blood loss (chronic): Secondary | ICD-10-CM | POA: Diagnosis not present

## 2019-08-12 DIAGNOSIS — Z86711 Personal history of pulmonary embolism: Secondary | ICD-10-CM | POA: Diagnosis not present

## 2019-08-12 DIAGNOSIS — Z7901 Long term (current) use of anticoagulants: Secondary | ICD-10-CM | POA: Diagnosis not present

## 2019-08-12 DIAGNOSIS — J449 Chronic obstructive pulmonary disease, unspecified: Secondary | ICD-10-CM | POA: Diagnosis not present

## 2019-08-13 ENCOUNTER — Other Ambulatory Visit: Payer: Self-pay | Admitting: Gastroenterology

## 2019-08-16 ENCOUNTER — Other Ambulatory Visit (HOSPITAL_COMMUNITY)
Admission: RE | Admit: 2019-08-16 | Discharge: 2019-08-16 | Disposition: A | Discharge status: Home / Self Care | Payer: PPO | Source: Ambulatory Visit | Attending: Gastroenterology | Admitting: Gastroenterology

## 2019-08-16 DIAGNOSIS — Z01812 Encounter for preprocedural laboratory examination: Secondary | ICD-10-CM | POA: Diagnosis not present

## 2019-08-16 DIAGNOSIS — Z20822 Contact with and (suspected) exposure to covid-19: Secondary | ICD-10-CM | POA: Diagnosis not present

## 2019-08-16 LAB — SARS CORONAVIRUS 2 (TAT 6-24 HRS): SARS Coronavirus 2: NEGATIVE

## 2019-08-18 DIAGNOSIS — D649 Anemia, unspecified: Secondary | ICD-10-CM | POA: Diagnosis not present

## 2019-08-18 DIAGNOSIS — Z7901 Long term (current) use of anticoagulants: Secondary | ICD-10-CM | POA: Diagnosis not present

## 2019-08-18 DIAGNOSIS — R195 Other fecal abnormalities: Secondary | ICD-10-CM | POA: Diagnosis not present

## 2019-08-18 DIAGNOSIS — D500 Iron deficiency anemia secondary to blood loss (chronic): Secondary | ICD-10-CM | POA: Diagnosis not present

## 2019-08-18 DIAGNOSIS — Z7184 Encounter for health counseling related to travel: Secondary | ICD-10-CM | POA: Diagnosis not present

## 2019-08-18 DIAGNOSIS — N390 Urinary tract infection, site not specified: Secondary | ICD-10-CM | POA: Diagnosis not present

## 2019-08-18 DIAGNOSIS — D509 Iron deficiency anemia, unspecified: Secondary | ICD-10-CM | POA: Diagnosis not present

## 2019-08-19 ENCOUNTER — Encounter (HOSPITAL_COMMUNITY): Payer: Self-pay | Admitting: Gastroenterology

## 2019-08-19 ENCOUNTER — Other Ambulatory Visit: Payer: Self-pay

## 2019-08-19 NOTE — Progress Notes (Signed)
Anesthesia Chart Review: SAME DAY WORK-UP (ENDO)  Case: [id number] Date/Time: 08/20/19 0730   Procedures:      ESOPHAGOGASTRODUODENOSCOPY (EGD) WITH PROPOFOL (N/A )     COLONOSCOPY WITH PROPOFOL (N/A )   Anesthesia type: Monitor Anesthesia Care   Pre-op diagnosis: Heme positive stool Dx-R19.5   Location: [facility] ROOM 1 / [facility]   Surgeons: Ronald Lobo, MD      DISCUSSION: Patient is a 79 year old female scheduled for the above procedure. Recent urgent referral to GI per Dr. Shelia Media for anemia/heme positive stool (HGB 14.6% 03/13/19->9.7% 07/28/19->8.7% 08/11/19) following 08/11/19 visit. Known Eliquis for PE/DVT (2020) and spontaneous retroperitoneal bleed (12/2018). S/p iron infusion last week.  Per 08/12/19 office note by Dr. Cristina Gong (scanned under Media tab), "I think this patient needs expeditious endoscopy and colonoscopy.  I spoke with the vascular surgeon; we will plan on early morning procedure, holding her Eliquis dose that morning but hopefully resuming it immediately post procedure.  We will keep the patient on her aspirin for the procedure.  We will plan to do the procedures at the hospital in view of her significant COPD."  History includes former smoker (quit 05/27/13), COPD, asthma, reflux, LLE DVT (01/13/19; s/p mechanical thrombectomy left CIV, EIV, CFV, and left femoral vein with balloon angioplasty of left femoral vein, and stent of left CIV, left EIV, and left CFV 05/26/19), spontaneous left retroperitoneal hematoma (01/13/19), IVC filter (01/13/19, removal 03/24/19). PE (01/28/19).  She had a CBC with differential and CMET on 08/11/19 at Dr. Pennie Banter office (scanned under Media tab). 08/16/2019 preprocedure COVID-19 test was negative.  Anesthesia team to evaluate on the day of procedure.   VS:   BP Readings from Last 3 Encounters:  07/28/19 (!) 176/88  07/18/19 138/66  06/30/19 140/82   Pulse Readings from Last 3 Encounters:  07/28/19 76  07/18/19 85  06/30/19 76     PROVIDERS: Deland Pretty, MD is PCP  - Sullivan Lone, MD is HEM-ONC. Last evaluation 07/28/19. By notes, "03/13/2019 JAK2 sequencing revealed 'No mutations'". Would consider moving patient to preventative dose Eliquis in the future if repeat US has no significant findings and labs stable in 6 months. - Servando Snare, MD is vascular surgeon. Last evaluation 07/18/19. Baird Lyons, MD is pulmonologist. Last evaluation 06/30/19 with mild COPD exacerbation.   LABS: PLT count 316K, H/H 8.7/29.3 08/11/19 and CMET WNL 08/11/19 except total protein low at 6.2 at Dr. Pennie Banter office (scanned under Media tab).   [facility] Spirometry 06/22/2016-severe obstructive airways disease. FVC 2.44/78%, FEV1 1.04/44%, ratio 0.43, FEF 25-75% 0.39/21%   IMAGES: CTA abd/pelvis 02/25/19: IMPRESSION: - No acute CT finding. - There has been near complete resolution of the previous left-sided retroperitoneal hematoma. The current CT demonstrates no evidence of prior source, which was presumed again to be spontaneous. - IVC filter in position. - Developing pelvic venous collaterals in the setting of left DVT. - Emphysematous changes at the lung bases. The nodularity at the left lung base is relatively unchanged, potentially representing chronic scarring/inflammatory changes. As previously noted, a 1 year follow-up chest CT from the prior August study is recommended. - Aortic Atherosclerosis (ICD10-I70.0).  CTA chest 01/28/19: IMPRESSION: Extensive acute pulmonary emboli involving the lobar and segmental branches of all pulmonary lobes. No main branch or saddle embolism. There is CT evidence of right heart strain.   EKG: 01/28/19 (in setting of acute PE): Sinus rhythm Borderline low voltage, extremity leads Nonspecific T abnormalities, anterior leads Prolonged QT interval [  QT/QTc 427/548 ms] Nonspecific TW abnormalities new in comparison to prior Confirmed by Gareth Morgan [phone]) on  01/28/2019 10:51:19 AM   CV: IVC/left iliac [place] 07/14/19: Summary:  IVC/Iliac: There is no evidence of thrombus involving the left common  iliac vein stent. There is no evidence of thrombus involving the left  external iliac vein stent. Chronic thrombus observed in the left common  femoral vein.     Echo 01/28/19 (in setting of acute PE): IMPRESSIONS  1. The left ventricle has normal systolic function, with an ejection  fraction of 55-60%. The cavity size was normal. Left ventricular diastolic  parameters were normal. No evidence of left ventricular regional wall  motion abnormalities.  2. The right ventricle has low normal systolic function. The cavity was  normal. There is no increase in right ventricular wall thickness. Right  ventricular systolic pressure is mildly elevated with an estimated  pressure of 33.7 mmHg.  3. The aorta is normal unless otherwise noted.  4. The interatrial septum was not assessed.    Cardiac cath 03/02/04:  CONCLUSION:  1.  Essentially normal coronary arteries.  2.  Normal left ventricular function.  3.  The symptoms at this point appear to be non-cardiac in origin.   Past Medical History:  Diagnosis Date  . Allergic rhinitis   . Asthma   . Chronic airway obstruction, not elsewhere classified   . Depressive disorder, not elsewhere classified   . DVT (deep venous thrombosis) (HCC)    LLE DVT 01/13/19  . Esophageal reflux   . PE (pulmonary thromboembolism) ([place]) 01/29/2019    Past Surgical History:  Procedure Laterality Date  . ABDOMINAL HYSTERECTOMY    . CYSTOSCOPY    . INTRAVASCULAR ULTRASOUND/IVUS Left 05/26/2019   Procedure: Intravascular Ultrasound/IVUS;  Surgeon: Waynetta Sandy, MD;  Location: [facility];  Service: Cardiovascular;  Laterality: Left;  . IVC FILTER REMOVAL N/A 03/24/2019   Procedure: IVC FILTER REMOVAL;  Surgeon: Waynetta Sandy, MD;  Location: [facility];  Service: Cardiovascular;   Laterality: N/A;  . LOWER EXTREMITY VENOGRAPHY Left 05/26/2019   Procedure: LOWER EXTREMITY VENOGRAPHY;  Surgeon: Waynetta Sandy, MD;  Location: [facility];  Service: Cardiovascular;  Laterality: Left;  . NASAL SINUS SURGERY    . PERIPHERAL VASCULAR INTERVENTION Left 05/26/2019   Procedure: PERIPHERAL VASCULAR INTERVENTION;  Surgeon: Waynetta Sandy, MD;  Location: [place];  Service: Cardiovascular;  Laterality: Left;  lower extremity veins  . PERIPHERAL VASCULAR THROMBECTOMY  05/26/2019   Procedure: PERIPHERAL VASCULAR THROMBECTOMY;  Surgeon: Waynetta Sandy, MD;  Location: [facility];  Service: Cardiovascular;;  . VENA CAVA FILTER PLACEMENT N/A 01/13/2019   Procedure: INSERTION VENA-CAVA FILTER;  Surgeon: Waynetta Sandy, MD;  Location: [facility];  Service: Vascular;  Laterality: N/A;    MEDICATIONS: No current facility-administered medications for this encounter.   Marland Kitchen albuterol (PROVENTIL) (2.5 MG/3ML) 0.083% nebulizer solution  . albuterol (VENTOLIN HFA) 108 (90 Base) MCG/ACT inhaler  . apixaban (ELIQUIS) 5 MG TABS tablet  . aspirin EC 81 MG EC tablet  . ferrous sulfate 325 (65 FE) MG tablet  . Fluticasone-Salmeterol (ADVAIR) 250-50 MCG/DOSE AEPB  . LORazepam (ATIVAN) 1 MG tablet  . omeprazole (PRILOSEC) 10 MG capsule  . potassium chloride (KLOR-CON) 8 MEQ tablet  . Tiotropium Bromide Monohydrate (SPIRIVA RESPIMAT) 2.5 MCG/ACT AERS  . traZODone (DESYREL) 100 MG tablet  . triamterene-hydrochlorothiazide (MAXZIDE-25) 37.5-25 MG tablet    Myra Gianotti, PA-C Surgical  Short Stay/Anesthesiology [facility] Phone [phone] [facility] Phone [phone] 08/19/2019 2:28 PM

## 2019-08-19 NOTE — Anesthesia Preprocedure Evaluation (Addendum)
Anesthesia Evaluation  Patient identified by MRN, date of birth, ID band Patient awake    Reviewed: Allergy & Precautions, NPO status , Patient's Chart, lab work & pertinent test results  History of Anesthesia Complications Negative for: history of anesthetic complications  Airway Mallampati: II  TM Distance: >3 FB Neck ROM: Full    Dental  (+) Partial Upper, Partial Lower,    Pulmonary asthma , COPD,  COPD inhaler, former smoker, PE (01/2019, on Eliquis)   Pulmonary exam normal        Cardiovascular negative cardio ROS Normal cardiovascular exam     Neuro/Psych Depression negative neurological ROS     GI/Hepatic Neg liver ROS, GERD  Medicated and Controlled,  Endo/Other  negative endocrine ROS  Renal/GU negative Renal ROS  negative genitourinary   Musculoskeletal negative musculoskeletal ROS (+)   Abdominal   Peds  Hematology  (+) anemia ,   Anesthesia Other Findings Day of surgery medications reviewed with patient.  Reproductive/Obstetrics negative OB ROS                            Anesthesia Physical Anesthesia Plan  ASA: III  Anesthesia Plan: MAC   Post-op Pain Management:    Induction:   PONV Risk Score and Plan: 2 and Treatment may vary due to age or medical condition and Propofol infusion  Airway Management Planned: Natural Airway and Nasal Cannula  Additional Equipment: None  Intra-op Plan:   Post-operative Plan:   Informed Consent: I have reviewed the patients History and Physical, chart, labs and discussed the procedure including the risks, benefits and alternatives for the proposed anesthesia with the patient or authorized representative who has indicated his/her understanding and acceptance.       Plan Discussed with: CRNA  Anesthesia Plan Comments: (See PAT note written 08/19/2019 by Myra Gianotti, PA-C. )      Anesthesia Quick Evaluation

## 2019-08-19 NOTE — Progress Notes (Signed)
Pt stated that she has been SOB since " losing blood. " Pt denies chest pain and being under the care of a cardiologist. Pt stated that she is under the care of Dr. Shelia Media, PCP. Pt stated that she cannot recall when she had her stress test. Pt stated that she was instructed to hold Aspirin and Eliquis on DOS. Pt made aware to stop taking vitamins, fish oil and herbal medications. Do not take any NSAIDs ie: Ibuprofen, Advil, Naproxen (Aleve), Motrin, BC and Goody Powder. Pt reminded to quarantine. Pt verbalized understanding of all pre-op instructions. PA, Anesthesiology, asked to review pt history.

## 2019-08-20 ENCOUNTER — Ambulatory Visit (HOSPITAL_COMMUNITY): Payer: PPO | Admitting: Vascular Surgery

## 2019-08-20 ENCOUNTER — Encounter (HOSPITAL_COMMUNITY)
Admission: RE | Disposition: A | Discharge status: Home / Self Care | Payer: Self-pay | Source: Home / Self Care | Attending: Gastroenterology

## 2019-08-20 ENCOUNTER — Ambulatory Visit (HOSPITAL_COMMUNITY)
Admission: RE | Admit: 2019-08-20 | Discharge: 2019-08-20 | Disposition: A | Discharge status: Home / Self Care | Payer: PPO | Attending: Gastroenterology | Admitting: Gastroenterology

## 2019-08-20 ENCOUNTER — Other Ambulatory Visit: Payer: Self-pay

## 2019-08-20 ENCOUNTER — Encounter (HOSPITAL_COMMUNITY): Payer: Self-pay | Admitting: Gastroenterology

## 2019-08-20 DIAGNOSIS — K5730 Diverticulosis of large intestine without perforation or abscess without bleeding: Secondary | ICD-10-CM | POA: Diagnosis not present

## 2019-08-20 DIAGNOSIS — J9601 Acute respiratory failure with hypoxia: Secondary | ICD-10-CM | POA: Diagnosis not present

## 2019-08-20 DIAGNOSIS — Z7982 Long term (current) use of aspirin: Secondary | ICD-10-CM | POA: Diagnosis not present

## 2019-08-20 DIAGNOSIS — Z79899 Other long term (current) drug therapy: Secondary | ICD-10-CM | POA: Diagnosis not present

## 2019-08-20 DIAGNOSIS — Z86718 Personal history of other venous thrombosis and embolism: Secondary | ICD-10-CM | POA: Insufficient documentation

## 2019-08-20 DIAGNOSIS — K219 Gastro-esophageal reflux disease without esophagitis: Secondary | ICD-10-CM | POA: Insufficient documentation

## 2019-08-20 DIAGNOSIS — Z86711 Personal history of pulmonary embolism: Secondary | ICD-10-CM | POA: Diagnosis not present

## 2019-08-20 DIAGNOSIS — Z7901 Long term (current) use of anticoagulants: Secondary | ICD-10-CM | POA: Diagnosis not present

## 2019-08-20 DIAGNOSIS — Z87891 Personal history of nicotine dependence: Secondary | ICD-10-CM | POA: Insufficient documentation

## 2019-08-20 DIAGNOSIS — R195 Other fecal abnormalities: Secondary | ICD-10-CM | POA: Diagnosis not present

## 2019-08-20 DIAGNOSIS — D500 Iron deficiency anemia secondary to blood loss (chronic): Secondary | ICD-10-CM | POA: Diagnosis not present

## 2019-08-20 DIAGNOSIS — J449 Chronic obstructive pulmonary disease, unspecified: Secondary | ICD-10-CM | POA: Diagnosis not present

## 2019-08-20 DIAGNOSIS — K5520 Angiodysplasia of colon without hemorrhage: Secondary | ICD-10-CM | POA: Insufficient documentation

## 2019-08-20 DIAGNOSIS — K449 Diaphragmatic hernia without obstruction or gangrene: Secondary | ICD-10-CM | POA: Insufficient documentation

## 2019-08-20 DIAGNOSIS — Z7951 Long term (current) use of inhaled steroids: Secondary | ICD-10-CM | POA: Insufficient documentation

## 2019-08-20 HISTORY — PX: ESOPHAGOGASTRODUODENOSCOPY (EGD) WITH PROPOFOL: SHX5813

## 2019-08-20 HISTORY — DX: Acute embolism and thrombosis of unspecified deep veins of unspecified lower extremity: I82.409

## 2019-08-20 HISTORY — PX: COLONOSCOPY WITH PROPOFOL: SHX5780

## 2019-08-20 HISTORY — DX: Presence of dental prosthetic device (complete) (partial): Z97.2

## 2019-08-20 HISTORY — DX: Iron deficiency anemia secondary to blood loss (chronic): D50.0

## 2019-08-20 HISTORY — PX: HEMOSTASIS CONTROL: SHX6838

## 2019-08-20 HISTORY — DX: Pneumonia, unspecified organism: J18.9

## 2019-08-20 SURGERY — ESOPHAGOGASTRODUODENOSCOPY (EGD) WITH PROPOFOL
Anesthesia: Monitor Anesthesia Care

## 2019-08-20 MED ORDER — PHENYLEPHRINE HCL (PRESSORS) 10 MG/ML IV SOLN
INTRAVENOUS | Status: DC | PRN
  Administered 2019-08-20 (×4): 80 ug via INTRAVENOUS

## 2019-08-20 MED ORDER — LACTATED RINGERS IV SOLN: INTRAVENOUS | Status: DC | PRN

## 2019-08-20 MED ORDER — PROMETHAZINE HCL 25 MG/ML IJ SOLN: 6.2500 mg | INTRAMUSCULAR | Status: DC | PRN

## 2019-08-20 MED ORDER — PROPOFOL 10 MG/ML IV BOLUS
INTRAVENOUS | Status: DC | PRN
  Administered 2019-08-20 (×2): 30 mg via INTRAVENOUS

## 2019-08-20 MED ORDER — LIDOCAINE 2% (20 MG/ML) 5 ML SYRINGE
INTRAMUSCULAR | Status: DC | PRN
  Administered 2019-08-20: 70 mg via INTRAVENOUS

## 2019-08-20 MED ORDER — PROPOFOL 500 MG/50ML IV EMUL
INTRAVENOUS | Status: DC | PRN
  Administered 2019-08-20: 150 ug/kg/min via INTRAVENOUS

## 2019-08-20 SURGICAL SUPPLY — 25 items

## 2019-08-20 NOTE — Transfer of Care (Signed)
Immediate Anesthesia Transfer of Care Note  Patient: [name]  Procedure(s) Performed: ESOPHAGOGASTRODUODENOSCOPY (EGD) WITH PROPOFOL (N/A ) COLONOSCOPY WITH PROPOFOL (N/A ) HEMOSTASIS CONTROL  Patient Location: Endoscopy Unit  Anesthesia Type:MAC  Level of Consciousness: awake, alert  and oriented  Airway & Oxygen Therapy: Patient Spontanous Breathing and Patient connected to nasal cannula oxygen  Post-op Assessment: Report given to RN, Post -op Vital signs reviewed and stable and Patient moving all extremities  Post vital signs: Reviewed and stable  Last Vitals:  Vitals Value Taken Time  BP 174/81 08/20/19 0827  Temp 36.6 C 08/20/19 0815  Pulse 85 08/20/19 0828  Resp 19 08/20/19 0828  SpO2 97 % 08/20/19 0828  Vitals shown include unvalidated device data.  Last Pain:  Vitals:   08/20/19 0827  TempSrc:   PainSc: 0-No pain         Complications: No apparent anesthesia complications

## 2019-08-20 NOTE — Anesthesia Postprocedure Evaluation (Signed)
Anesthesia Post Note  Patient: [name]  Procedure(s) Performed: ESOPHAGOGASTRODUODENOSCOPY (EGD) WITH PROPOFOL (N/A ) COLONOSCOPY WITH PROPOFOL (N/A ) HEMOSTASIS CONTROL     Patient location during evaluation: PACU Anesthesia Type: MAC Level of consciousness: awake and alert and oriented Pain management: pain level controlled Vital Signs Assessment: post-procedure vital signs reviewed and stable Respiratory status: spontaneous breathing, nonlabored ventilation and respiratory function stable Cardiovascular status: blood pressure returned to baseline Postop Assessment: no apparent nausea or vomiting Anesthetic complications: no    Last Vitals:  Vitals:   08/20/19 0815 08/20/19 0827  BP: 99/65 (!) 174/81  Pulse: 85 88  Resp: 16 15  Temp: 36.6 C   SpO2: 96% 94%    Last Pain:  Vitals:   08/20/19 0827  TempSrc:   PainSc: 0-No pain                 Brennan Bailey

## 2019-08-20 NOTE — Anesthesia Procedure Notes (Signed)
Procedure Name: MAC Date/Time: 08/20/2019 7:35 AM Performed by: Amadeo Garnet, CRNA Pre-anesthesia Checklist: Patient identified, Emergency Drugs available, Suction available and Patient being monitored Patient Re-evaluated:Patient Re-evaluated prior to induction Oxygen Delivery Method: Nasal cannula Preoxygenation: Pre-oxygenation with 100% oxygen Induction Type: IV induction Placement Confirmation: positive ETCO2 Dental Injury: Teeth and Oropharynx as per pre-operative assessment

## 2019-08-20 NOTE — Discharge Instructions (Signed)
YOU HAD AN ENDOSCOPIC PROCEDURE TODAY: Refer to the procedure report and other information in the discharge instructions given to you for any specific questions about what was found during the examination. If this information does not answer your questions, please call Main GI office at [phone] to clarify.   YOU SHOULD EXPECT: Some feelings of bloating in the abdomen. Passage of more gas than usual. Walking can help get rid of the air that was put into your GI tract during the procedure and reduce the bloating. If you had a lower endoscopy (such as a colonoscopy or flexible sigmoidoscopy) you may notice spotting of blood in your stool or on the toilet paper. Some abdominal soreness may be present for a day or two, also.  DIET: Your first meal following the procedure should be a light meal and then it is ok to progress to your normal diet. A half-sandwich or bowl of soup is an example of a good first meal. Heavy or fried foods are harder to digest and may make you feel nauseous or bloated. Drink plenty of fluids but you should avoid alcoholic beverages for 24 hours. If you had a esophageal dilation, please see attached instructions for diet.    ACTIVITY: Your care partner should take you home directly after the procedure. You should plan to take it easy, moving slowly for the rest of the day. You can resume normal activity the day after the procedure however YOU SHOULD NOT DRIVE, use power tools, machinery or perform tasks that involve climbing or major physical exertion for 24 hours (because of the sedation medicines used during the test).   SYMPTOMS TO REPORT IMMEDIATELY: A gastroenterologist can be reached at any hour. Please call [phone]  for any of the following symptoms:   Following lower endoscopy (colonoscopy, flexible sigmoidoscopy) Excessive amounts of blood in the stool  Significant tenderness, worsening of abdominal pains  Swelling of the abdomen that is new, acute  Fever of 100  or higher   Following upper endoscopy (EGD, EUS, ERCP, esophageal dilation) Vomiting of blood or coffee ground material  New, significant abdominal pain  New, significant chest pain or pain under the shoulder blades  Painful or persistently difficult swallowing  New shortness of breath  Black, tarry-looking or red, bloody stools  FOLLOW UP:  Call [phone] with any specific questions about appointments or follow up tests.

## 2019-08-20 NOTE — Addendum Note (Signed)
Addendum  created 08/20/19 0956 by Amadeo Garnet, CRNA   Intraprocedure Event edited, Intraprocedure Staff edited

## 2019-08-20 NOTE — Op Note (Addendum)
[facility] Patient Name: [name] Procedure Date : 08/20/2019 MRN: [id number] Attending MD: Ronald Lobo , MD Date of Birth: [birth year] CSN: [id number] Age: 79 Admit Type: Outpatient Procedure:                Colonoscopy Indications:              Last colonoscopy: April 2013, Heme positive stool,                            Iron deficiency anemia secondary to chronic blood                            loss in a patient on Eliquis and ASA Providers:                Ronald Lobo, MD, Benetta Spar RN, RN, Cherylynn Ridges, Technician Referring MD:              Medicines:                Monitored Anesthesia Care Complications:            No immediate complications. Estimated Blood Loss:     Estimated blood loss was minimal. Procedure:                Pre-Anesthesia Assessment:                           - Prior to the procedure, a History and Physical                            was performed, and patient medications and                            allergies were reviewed. The patient's tolerance of                            previous anesthesia was also reviewed. The risks                            and benefits of the procedure and the sedation                            options and risks were discussed with the patient.                            All questions were answered, and informed consent                            was obtained. Prior Anticoagulants: The patient has                            taken Eliquis (apixaban), last dose was 1 day prior  to procedure. ASA Grade Assessment: III - A patient                            with severe systemic disease. After reviewing the                            risks and benefits, the patient was deemed in                            satisfactory condition to undergo the procedure.                           After obtaining informed consent, the colonoscope        was passed under direct vision. Throughout the                            procedure, the patient's blood pressure, pulse, and                            oxygen saturations were monitored continuously. The                            [id number] Olympus pediatric colonscope                            was introduced through the anus and advanced to the                            the terminal ileum. The colonoscopy was performed                            without difficulty. The patient tolerated the                            procedure well. The quality of the bowel                            preparation was excellent. Scope In: 7:50:43 AM Scope Out: 8:07:44 AM Scope Withdrawal Time: 0 hours 13 minutes 29 seconds  Total Procedure Duration: 0 hours 17 minutes 1 second  Findings:      The perianal and digital rectal examinations were normal.      A single medium-sized angiodysplastic lesion (approximately 3 x 10 mm)       without bleeding was found in the cecum. Fulguration to ablate the       lesion to prevent bleeding by argon plasma was successful. This did lead       initially to some very transient brisk oozing, suggesting that this may       have been an unstable lesion and responsible for the patient's recent       blood loss. Estimated blood loss was minimal.      Multiple medium-mouthed diverticula were found in the sigmoid colon.      No other significant abnormalities were identified in a careful       examination of the remainder  of the colon.      The terminal ileum appeared normal.      The retroflexed view of the distal rectum and anal verge was normal and       showed no anal or rectal abnormalities. Reinspection of the rectum       showed no additional findings. Impression:               - A single non-bleeding colonic angiodysplastic                            lesion. Treated with argon plasma coagulation                            (APC). This lesion is the  suspected source of the                            patient's recent blood loss.                           - Diverticulosis in the sigmoid colon.                           - The examined portion of the ileum was normal.                           - The distal rectum and anal verge are normal on                            retroflexion view.                           - No specimens collected. Recommendation:           - Return to my office PRN.                           - Monitor hemoglobin and hemoccult status. Consider                            capsule endoscopy and/or repeat colonoscopy (for                            re-treatment of the cecal AVM) if the patient shows                            future evidence of further blood loss.                           - Repeat colonoscopy is not recommended for                            screening purposes in view of the patient's age. Procedure Code(s):        --- Professional ---                           [phone],  Colonoscopy, flexible; with control of                            bleeding, any method Diagnosis Code(s):        --- Professional ---                           K55.20, Angiodysplasia of colon without hemorrhage                           R19.5, Other fecal abnormalities                           D50.0, Iron deficiency anemia secondary to blood                            loss (chronic) CPT copyright 2019 [employer]. All rights reserved. The codes documented in this report are preliminary and upon coder review may  be revised to meet current compliance requirements. Ronald Lobo, MD 08/20/2019 8:23:54 AM This report has been signed electronically. Number of Addenda: 0

## 2019-08-20 NOTE — H&P (Signed)
[name] is an 79 y.o. female.   Chief Complaint: Heme positive stool and anemia HPI: This 79 year old female with COPD, home oxygen, history of DVT with extensive pulmonary emboli last August, transient placement of IVC filter, now on chronic anticoagulation with Eliquis, status post thrombectomy of her left iliac and femoral veins with stent placement, maintained on aspirin as well as Eliquis, was seen by me in the office about a week ago for evaluation of heme positive stool and anemia at the request of her primary physician.    Her baseline hemoglobin back in October was 14.6, but when checked on March 1 it was 9.7 and on March 15, it had dropped to 8.7.    3 out of 3 Hemoccults came back positive and she was also Hemoccult positive (but without frank melena) when I checked her in the office about a week ago.    Over the past 10 days, she has received 2 iron infusions, and an updated hemoglobin yesterday was improved at 10.1.  However, she still feels very weak.    She does not have any active upper or lower GI tract symptoms, anorexia, or weight loss, and note that she is maintained on omeprazole.  Previous evaluation by me included colonoscopy in April 2013 which was negative.  Past Medical History:  Diagnosis Date  . Allergic rhinitis   . Anemia due to GI blood loss   . Asthma   . Chronic airway obstruction, not elsewhere classified   . Depressive disorder, not elsewhere classified   . DVT (deep venous thrombosis) (HCC)    LLE DVT 01/13/19  . Esophageal reflux   . PE (pulmonary thromboembolism) ([place]) 01/29/2019  . Pneumonia   . Wears partial dentures     Past Surgical History:  Procedure Laterality Date  . ABDOMINAL HYSTERECTOMY    . ABDOMINAL HYSTERECTOMY    . CATARACT EXTRACTION W/ INTRAOCULAR LENS  IMPLANT, BILATERAL    . COLONOSCOPY    . CYSTOSCOPY    . DILATION AND CURETTAGE OF UTERUS    . INTRAVASCULAR ULTRASOUND/IVUS Left 05/26/2019   Procedure: Intravascular  Ultrasound/IVUS;  Surgeon: Waynetta Sandy, MD;  Location: [facility];  Service: Cardiovascular;  Laterality: Left;  . IVC FILTER REMOVAL N/A 03/24/2019   Procedure: IVC FILTER REMOVAL;  Surgeon: Waynetta Sandy, MD;  Location: [place] [facility];  Service: Cardiovascular;  Laterality: N/A;  . LOWER EXTREMITY VENOGRAPHY Left 05/26/2019   Procedure: LOWER EXTREMITY VENOGRAPHY;  Surgeon: Waynetta Sandy, MD;  Location: [facility];  Service: Cardiovascular;  Laterality: Left;  Marland Kitchen MULTIPLE TOOTH EXTRACTIONS    . NASAL SINUS SURGERY    . PERIPHERAL VASCULAR INTERVENTION Left 05/26/2019   Procedure: PERIPHERAL VASCULAR INTERVENTION;  Surgeon: Waynetta Sandy, MD;  Location: [facility];  Service: Cardiovascular;  Laterality: Left;  lower extremity veins  . PERIPHERAL VASCULAR THROMBECTOMY  05/26/2019   Procedure: PERIPHERAL VASCULAR THROMBECTOMY;  Surgeon: Waynetta Sandy, MD;  Location: [facility];  Service: Cardiovascular;;  . TUBAL LIGATION    . VENA CAVA FILTER PLACEMENT N/A 01/13/2019   Procedure: INSERTION VENA-CAVA FILTER;  Surgeon: Waynetta Sandy, MD;  Location: [facility] OR;  Service: Vascular;  Laterality: N/A;    Family History  Problem Relation Age of Onset  . Diabetes Mother   . Coronary artery disease Mother   . Emphysema Father   . Coronary artery disease Brother    Social History:  reports that she  quit smoking about 6 years ago. Her smoking use included cigarettes. She has a 10.00 pack-year smoking history. She has never used smokeless tobacco. She reports that she does not drink alcohol or use drugs.  Allergies:  Allergies  Allergen Reactions  . Clarithromycin Swelling  . Penicillins Swelling    Tongue swelling Did it involve swelling of the face/tongue/throat, SOB, or low BP? Yes Did it involve sudden or severe rash/hives, skin peeling, or any reaction on the inside of your mouth or nose?  Yes Did you need to seek medical attention at a hospital or doctor's office? Yes When did it last happen?childhood If all above answers are "NO", may proceed with cephalosporin use.   . Pneumococcal Vaccines Other (See Comments)    Couldn't breathe  . Sulfonamide Derivatives Swelling  . Estradiol Rash    Cream gives her a rash    Medications Prior to Admission  Medication Sig Dispense Refill  . albuterol (PROVENTIL) (2.5 MG/3ML) 0.083% nebulizer solution Take 3 mLs (2.5 mg total) by nebulization every 6 (six) hours as needed for wheezing or shortness of breath. 75 mL 12  . albuterol (VENTOLIN HFA) 108 (90 Base) MCG/ACT inhaler Inhale 1-2 puffs into the lungs every 6 (six) hours as needed for wheezing or shortness of breath.    Marland Kitchen apixaban (ELIQUIS) 5 MG TABS tablet Take 5 mg by mouth 2 (two) times daily.    Marland Kitchen aspirin EC 81 MG EC tablet Take 1 tablet (81 mg total) by mouth daily. 30 tablet 0  . ferrous sulfate 325 (65 FE) MG tablet Take 325 mg by mouth daily with breakfast.    . Fluticasone-Salmeterol (ADVAIR) 250-50 MCG/DOSE AEPB INHALE 1 DOSE BY MOUTH TWICE DAILY RINSE  MOUTH  AFTER  USE (Patient taking differently: Inhale 1 puff into the lungs in the morning and at bedtime. , RINSE MOUTH AFTER USE) 60 each 5  . LORazepam (ATIVAN) 1 MG tablet Take 0.5-1 mg by mouth at bedtime. Additional 1 mg if needed    . omeprazole (PRILOSEC) 10 MG capsule Take 10 mg by mouth daily.     . potassium chloride (KLOR-CON) 8 MEQ tablet Take 8 mEq by mouth daily.    . Tiotropium Bromide Monohydrate (SPIRIVA RESPIMAT) 2.5 MCG/ACT AERS Inhale 1 puff into the lungs daily with lunch.     . traZODone (DESYREL) 100 MG tablet Take 100 mg by mouth at bedtime.     . triamterene-hydrochlorothiazide (MAXZIDE-25) 37.5-25 MG tablet Take 0.5 tablets by mouth daily.      No results found for this or any previous visit (from the past 48 hour(s)). No results found.  Review of Systems see HPI  Blood pressure (!)  147/91, pulse 91, temperature 98 F (36.7 C), temperature source Oral, resp. rate 18, height 5\' 6"  (1.676 m), weight 73.9 kg, SpO2 93 %. Physical Exam despite her multiple medical problems, this is actually a very healthy appearing, well-groomed female in no evident distress, specifically no respiratory distress.  She is anicteric and really without frank pallor.  Heart sounds are distant but grossly normal.  Breath sounds are reduced and there is a soft expiratory wheeze.  The abdomen is nondistended and without evident tenderness  Assessment/Plan Heme positive stool and progressive anemia, responding to IV iron infusion.  The reason for this is unclear, since aspirin induced gastropathy would be relatively unlikely given her use of omeprazole.  We will proceed to endoscopy and colonoscopy, the purpose and risks of which were reviewed with  the patient in the office recently and she is agreeable to proceed, recognizing a somewhat increased procedural risk in view of her multiple medical problems, most notably her COPD.  Her last dose of Eliquis was approximately 12 hours ago and we hope to resume it immediately following today's procedures, thereby not leaving the patient with any significant "window" where she is without anticoagulation.  At our request, she has stayed on her aspirin preprocedure.  Cleotis Nipper, MD 08/20/2019, 7:30 AM

## 2019-08-20 NOTE — Op Note (Signed)
[facility] Patient Name: [name] Procedure Date : 08/20/2019 MRN: [id number] Attending MD: Ronald Lobo , MD Date of Birth: November 24, 1940 CSN: [id number] Age: 79 Admit Type: Outpatient Procedure:                Upper GI endoscopy Indications:              Iron deficiency anemia secondary to chronic blood                            loss, Heme positive stool in a patient on Eliquis                            and aspirin. Providers:                Ronald Lobo, MD, Benetta Spar RN, RN, Cherylynn Ridges, Technician Referring MD:              Medicines:                Monitored Anesthesia Care Complications:            No immediate complications. Estimated Blood Loss:     Estimated blood loss: none. Procedure:                Pre-Anesthesia Assessment:                           - Prior to the procedure, a History and Physical                            was performed, and patient medications and                            allergies were reviewed. The patient's tolerance of                            previous anesthesia was also reviewed. The risks                            and benefits of the procedure and the sedation                            options and risks were discussed with the patient.                            All questions were answered, and informed consent                            was obtained. Prior Anticoagulants: The patient has                            taken Eliquis (apixaban), last dose was 1 day prior  to procedure. ASA Grade Assessment: III - A patient                            with severe systemic disease. After reviewing the                            risks and benefits, the patient was deemed in                            satisfactory condition to undergo the procedure.                           After obtaining informed consent, the endoscope was                            passed under  direct vision. Throughout the                            procedure, the patient's blood pressure, pulse, and                            oxygen saturations were monitored continuously. The                            [id number] Olympus gastroscope was                            introduced through the mouth, and advanced to the                            second part of duodenum. The upper GI endoscopy was                            accomplished without difficulty. The patient                            tolerated the procedure well. Scope In: Scope Out: Findings:      The larynx was normal.      The examined esophagus was normal.      A 3 cm hiatal hernia was present.      The entire examined stomach was normal.      The cardia and gastric fundus were normal on retroflexion.      The examined duodenum was normal. Impression:               - Normal larynx.                           - Normal esophagus.                           - 3 cm hiatal hernia.                           - Normal stomach. No evidence of ASA gastropathy.                           -  Normal examined duodenum.                           - No specimens collected.                           - NO SOURCE OF ANEMIA OR HEME POSITIVITY evident on                            this exam. Recommendation:           - Continue present medications.                           - Perform a colonoscopy today. Procedure Code(s):        --- Professional ---                           [phone], Esophagogastroduodenoscopy, flexible,                            transoral; diagnostic, including collection of                            specimen(s) by brushing or washing, when performed                            (separate procedure) Diagnosis Code(s):        --- Professional ---                           D50.0, Iron deficiency anemia secondary to blood                            loss (chronic)                           R19.5, Other fecal  abnormalities CPT copyright 2019 American Medical Association. All rights reserved. The codes documented in this report are preliminary and upon coder review may  be revised to meet current compliance requirements. Ronald Lobo, MD 08/20/2019 8:15:02 AM This report has been signed electronically. Number of Addenda: 0

## 2019-08-26 DIAGNOSIS — J449 Chronic obstructive pulmonary disease, unspecified: Secondary | ICD-10-CM | POA: Diagnosis not present

## 2019-08-27 ENCOUNTER — Observation Stay (HOSPITAL_COMMUNITY)
Admission: EM | Admit: 2019-08-27 | Discharge: 2019-08-28 | Disposition: A | Discharge status: Home / Self Care | Payer: PPO | Attending: Internal Medicine | Admitting: Internal Medicine

## 2019-08-27 ENCOUNTER — Encounter (HOSPITAL_COMMUNITY): Payer: Self-pay | Admitting: Emergency Medicine

## 2019-08-27 DIAGNOSIS — Z86711 Personal history of pulmonary embolism: Secondary | ICD-10-CM | POA: Diagnosis present

## 2019-08-27 DIAGNOSIS — K219 Gastro-esophageal reflux disease without esophagitis: Secondary | ICD-10-CM | POA: Insufficient documentation

## 2019-08-27 DIAGNOSIS — J449 Chronic obstructive pulmonary disease, unspecified: Secondary | ICD-10-CM | POA: Diagnosis present

## 2019-08-27 DIAGNOSIS — Z86718 Personal history of other venous thrombosis and embolism: Secondary | ICD-10-CM | POA: Diagnosis not present

## 2019-08-27 DIAGNOSIS — Z7951 Long term (current) use of inhaled steroids: Secondary | ICD-10-CM | POA: Diagnosis not present

## 2019-08-27 DIAGNOSIS — Z95820 Peripheral vascular angioplasty status with implants and grafts: Secondary | ICD-10-CM | POA: Insufficient documentation

## 2019-08-27 DIAGNOSIS — D500 Iron deficiency anemia secondary to blood loss (chronic): Secondary | ICD-10-CM | POA: Insufficient documentation

## 2019-08-27 DIAGNOSIS — Z7901 Long term (current) use of anticoagulants: Secondary | ICD-10-CM | POA: Insufficient documentation

## 2019-08-27 DIAGNOSIS — Z20822 Contact with and (suspected) exposure to covid-19: Secondary | ICD-10-CM | POA: Diagnosis not present

## 2019-08-27 DIAGNOSIS — Z882 Allergy status to sulfonamides status: Secondary | ICD-10-CM | POA: Diagnosis not present

## 2019-08-27 DIAGNOSIS — N390 Urinary tract infection, site not specified: Secondary | ICD-10-CM | POA: Diagnosis not present

## 2019-08-27 DIAGNOSIS — I10 Essential (primary) hypertension: Secondary | ICD-10-CM

## 2019-08-27 DIAGNOSIS — Z792 Long term (current) use of antibiotics: Secondary | ICD-10-CM | POA: Diagnosis not present

## 2019-08-27 DIAGNOSIS — Z87891 Personal history of nicotine dependence: Secondary | ICD-10-CM | POA: Insufficient documentation

## 2019-08-27 DIAGNOSIS — R739 Hyperglycemia, unspecified: Secondary | ICD-10-CM | POA: Diagnosis not present

## 2019-08-27 DIAGNOSIS — Z79899 Other long term (current) drug therapy: Secondary | ICD-10-CM | POA: Insufficient documentation

## 2019-08-27 DIAGNOSIS — K921 Melena: Principal | ICD-10-CM | POA: Insufficient documentation

## 2019-08-27 DIAGNOSIS — Z880 Allergy status to penicillin: Secondary | ICD-10-CM | POA: Insufficient documentation

## 2019-08-27 DIAGNOSIS — K922 Gastrointestinal hemorrhage, unspecified: Secondary | ICD-10-CM | POA: Diagnosis not present

## 2019-08-27 DIAGNOSIS — Z7982 Long term (current) use of aspirin: Secondary | ICD-10-CM | POA: Insufficient documentation

## 2019-08-27 HISTORY — DX: Essential (primary) hypertension: I10

## 2019-08-27 LAB — CBC
HCT: 35.6 % — ABNORMAL LOW (ref 36.0–46.0)
HCT: 30.4 % — ABNORMAL LOW (ref 36.0–46.0)
Hemoglobin: 9.9 g/dL — ABNORMAL LOW (ref 12.0–15.0)
Hemoglobin: 8.6 g/dL — ABNORMAL LOW (ref 12.0–15.0)
MCH: 24.4 pg — ABNORMAL LOW (ref 26.0–34.0)
MCH: 24.6 pg — ABNORMAL LOW (ref 26.0–34.0)
MCHC: 27.8 g/dL — ABNORMAL LOW (ref 30.0–36.0)
MCHC: 28.3 g/dL — ABNORMAL LOW (ref 30.0–36.0)
MCV: 87.9 fL (ref 80.0–100.0)
MCV: 87.1 fL (ref 80.0–100.0)
Platelets: 449 10*3/uL — ABNORMAL HIGH (ref 150–400)
Platelets: 395 10*3/uL (ref 150–400)
RBC: 4.05 MIL/uL (ref 3.87–5.11)
RBC: 3.49 MIL/uL — ABNORMAL LOW (ref 3.87–5.11)
RDW: 21.7 % — ABNORMAL HIGH (ref 11.5–15.5)
RDW: 21.8 % — ABNORMAL HIGH (ref 11.5–15.5)
WBC: 5.4 10*3/uL (ref 4.0–10.5)
WBC: 5.2 10*3/uL (ref 4.0–10.5)
nRBC: 0 % (ref 0.0–0.2)
nRBC: 0 % (ref 0.0–0.2)

## 2019-08-27 LAB — COMPREHENSIVE METABOLIC PANEL
ALT: 21 U/L (ref 0–44)
AST: 28 U/L (ref 15–41)
Albumin: 3.7 g/dL (ref 3.5–5.0)
Alkaline Phosphatase: 58 U/L (ref 38–126)
Anion gap: 14 (ref 5–15)
BUN: 18 mg/dL (ref 8–23)
CO2: 21 mmol/L — ABNORMAL LOW (ref 22–32)
Calcium: 8.3 mg/dL — ABNORMAL LOW (ref 8.9–10.3)
Chloride: 103 mmol/L (ref 98–111)
Creatinine, Ser: 0.98 mg/dL (ref 0.44–1.00)
GFR calc Af Amer: >60 mL/min (ref >60)
GFR calc non Af Amer: 56 mL/min — ABNORMAL LOW (ref >60)
Glucose, Bld: 187 mg/dL — ABNORMAL HIGH (ref 70–99)
Potassium: 4.1 mmol/L (ref 3.5–5.1)
Sodium: 138 mmol/L (ref 135–145)
Total Bilirubin: 0.4 mg/dL (ref 0.3–1.2)
Total Protein: 6 g/dL — ABNORMAL LOW (ref 6.5–8.1)

## 2019-08-27 LAB — HEMOGLOBIN A1C
Hgb A1c MFr Bld: 4.9 % (ref 4.8–5.6)
Mean Plasma Glucose: 93.93 mg/dL

## 2019-08-27 LAB — RESPIRATORY PANEL BY RT PCR (FLU A&B, COVID)
Influenza A by PCR: NEGATIVE
Influenza B by PCR: NEGATIVE
SARS Coronavirus 2 by RT PCR: NEGATIVE

## 2019-08-27 LAB — POC OCCULT BLOOD, ED: Fecal Occult Bld: POSITIVE — AB

## 2019-08-27 LAB — TYPE AND SCREEN
ABO/RH(D): O POS
Antibody Screen: NEGATIVE

## 2019-08-27 MED ORDER — PANTOPRAZOLE SODIUM 40 MG PO TBEC
40.0000 mg | DELAYED_RELEASE_TABLET | Freq: Every day | ORAL | Status: DC
  Administered 2019-08-28: 40 mg via ORAL
  Filled 2019-08-27: qty 1

## 2019-08-27 MED ORDER — MOMETASONE FURO-FORMOTEROL FUM 200-5 MCG/ACT IN AERO
2.0000 | INHALATION_SPRAY | Freq: Two times a day (BID) | RESPIRATORY_TRACT | Status: DC
  Administered 2019-08-27 – 2019-08-28 (×2): 2 via RESPIRATORY_TRACT
  Filled 2019-08-27: qty 8.8

## 2019-08-27 MED ORDER — NITROFURANTOIN MONOHYD MACRO 100 MG PO CAPS
100.0000 mg | ORAL_CAPSULE | Freq: Every day | ORAL | Status: DC
  Administered 2019-08-28: 100 mg via ORAL
  Filled 2019-08-27: qty 1

## 2019-08-27 MED ORDER — LORAZEPAM 0.5 MG PO TABS
0.5000 mg | ORAL_TABLET | Freq: Every day | ORAL | Status: DC
  Administered 2019-08-27: 0.5 mg via ORAL
  Filled 2019-08-27: qty 1

## 2019-08-27 MED ORDER — FERROUS SULFATE 325 (65 FE) MG PO TABS
325.0000 mg | ORAL_TABLET | Freq: Every day | ORAL | Status: DC
  Administered 2019-08-28: 325 mg via ORAL
  Filled 2019-08-27: qty 1

## 2019-08-27 MED ORDER — ALBUTEROL SULFATE (2.5 MG/3ML) 0.083% IN NEBU: 3.0000 mL | INHALATION_SOLUTION | Freq: Four times a day (QID) | RESPIRATORY_TRACT | Status: DC | PRN

## 2019-08-27 MED ORDER — ACETAMINOPHEN 325 MG PO TABS: 650.0000 mg | ORAL_TABLET | Freq: Four times a day (QID) | ORAL | Status: DC | PRN

## 2019-08-27 MED ORDER — LACTATED RINGERS IV SOLN: INTRAVENOUS | Status: AC

## 2019-08-27 MED ORDER — ONDANSETRON HCL 4 MG/2ML IJ SOLN: 4.0000 mg | Freq: Four times a day (QID) | INTRAMUSCULAR | Status: DC | PRN

## 2019-08-27 MED ORDER — UMECLIDINIUM BROMIDE 62.5 MCG/INH IN AEPB
1.0000 | INHALATION_SPRAY | Freq: Every day | RESPIRATORY_TRACT | Status: DC
  Administered 2019-08-27 – 2019-08-28 (×2): 1 via RESPIRATORY_TRACT
  Filled 2019-08-27 (×3): qty 7

## 2019-08-27 MED ORDER — ONDANSETRON HCL 4 MG PO TABS: 4.0000 mg | ORAL_TABLET | Freq: Four times a day (QID) | ORAL | Status: DC | PRN

## 2019-08-27 MED ORDER — HYDROCODONE-ACETAMINOPHEN 5-325 MG PO TABS: 1.0000 | ORAL_TABLET | ORAL | Status: DC | PRN

## 2019-08-27 MED ORDER — TRAZODONE HCL 100 MG PO TABS
100.0000 mg | ORAL_TABLET | Freq: Every day | ORAL | Status: DC
  Administered 2019-08-27: 100 mg via ORAL
  Filled 2019-08-27: qty 1

## 2019-08-27 MED ORDER — POTASSIUM CHLORIDE CRYS ER 10 MEQ PO TBCR
10.0000 meq | EXTENDED_RELEASE_TABLET | Freq: Every day | ORAL | Status: DC
  Administered 2019-08-28: 10 meq via ORAL
  Filled 2019-08-27: qty 1

## 2019-08-27 MED ORDER — TRIAMTERENE-HCTZ 37.5-25 MG PO TABS
0.5000 | ORAL_TABLET | Freq: Every day | ORAL | Status: DC
  Administered 2019-08-28: 0.5 via ORAL
  Filled 2019-08-27: qty 0.5

## 2019-08-27 MED ORDER — ACETAMINOPHEN 650 MG RE SUPP: 650.0000 mg | Freq: Four times a day (QID) | RECTAL | Status: DC | PRN

## 2019-08-27 MED ORDER — TIOTROPIUM BROMIDE MONOHYDRATE 2.5 MCG/ACT IN AERS: 1.0000 | INHALATION_SPRAY | Freq: Every day | RESPIRATORY_TRACT | Status: DC

## 2019-08-27 NOTE — ED Notes (Signed)
Lunch Tray Ordered 1115. 

## 2019-08-27 NOTE — Consult Note (Signed)
Subjective:   HPI  The patient is a 79 year old female who we were asked to see in consultation today because of lower gastrointestinal bleeding.  The patient says that around midnight last night she had to get up from bed and felt like she had diarrhea and went to the bathroom, and also had several other episodes of diarrhea last night.  She states that she did not look at any of the stool until about 530 this morning when she noticed a lot of blood.  She therefore came to the emergency room.  On March 24 she had an EGD which showed a hiatal hernia, and she also had a colonoscopy showing left-sided diverticulosis and an angiodysplastic lesion in the cecum which was cauterized using argon plasma coagulation.  She did fine after the procedures until now.  She feels a little lightheaded.  She has been on Eliquis last dose yesterday.  Review of Systems No chest pain or shortness of breath  Past Medical History:  Diagnosis Date  . Allergic rhinitis   . Anemia due to GI blood loss   . Asthma   . Chronic airway obstruction, not elsewhere classified   . Depressive disorder, not elsewhere classified   . DVT (deep venous thrombosis) (HCC)    LLE DVT 01/13/19  . Esophageal reflux   . PE (pulmonary thromboembolism) ([place]) 01/29/2019  . Pneumonia   . Wears partial dentures    Past Surgical History:  Procedure Laterality Date  . ABDOMINAL HYSTERECTOMY    . ABDOMINAL HYSTERECTOMY    . CATARACT EXTRACTION W/ INTRAOCULAR LENS  IMPLANT, BILATERAL    . COLONOSCOPY    . COLONOSCOPY WITH PROPOFOL N/A 08/20/2019   Procedure: COLONOSCOPY WITH PROPOFOL;  Surgeon: Ronald Lobo, MD;  Location: [facility];  Service: Endoscopy;  Laterality: N/A;  . CYSTOSCOPY    . DILATION AND CURETTAGE OF UTERUS    . ESOPHAGOGASTRODUODENOSCOPY (EGD) WITH PROPOFOL N/A 08/20/2019   Procedure: ESOPHAGOGASTRODUODENOSCOPY (EGD) WITH PROPOFOL;  Surgeon: Ronald Lobo, MD;  Location: [facility];  Service: Endoscopy;   Laterality: N/A;  . HEMOSTASIS CONTROL  08/20/2019   Procedure: HEMOSTASIS CONTROL;  Surgeon: Ronald Lobo, MD;  Location: [facility];  Service: Endoscopy;;  . INTRAVASCULAR ULTRASOUND/IVUS Left 05/26/2019   Procedure: Intravascular Ultrasound/IVUS;  Surgeon: Waynetta Sandy, MD;  Location: [facility];  Service: Cardiovascular;  Laterality: Left;  . IVC FILTER REMOVAL N/A 03/24/2019   Procedure: IVC FILTER REMOVAL;  Surgeon: Waynetta Sandy, MD;  Location: [place] [facility];  Service: Cardiovascular;  Laterality: N/A;  . LOWER EXTREMITY VENOGRAPHY Left 05/26/2019   Procedure: LOWER EXTREMITY VENOGRAPHY;  Surgeon: Waynetta Sandy, MD;  Location: [facility];  Service: Cardiovascular;  Laterality: Left;  Marland Kitchen MULTIPLE TOOTH EXTRACTIONS    . NASAL SINUS SURGERY    . PERIPHERAL VASCULAR INTERVENTION Left 05/26/2019   Procedure: PERIPHERAL VASCULAR INTERVENTION;  Surgeon: Waynetta Sandy, MD;  Location: [facility];  Service: Cardiovascular;  Laterality: Left;  lower extremity veins  . PERIPHERAL VASCULAR THROMBECTOMY  05/26/2019   Procedure: PERIPHERAL VASCULAR THROMBECTOMY;  Surgeon: Waynetta Sandy, MD;  Location: [facility] [facility];  Service: Cardiovascular;;  . TUBAL LIGATION    . VENA CAVA FILTER PLACEMENT N/A 01/13/2019   Procedure: INSERTION VENA-CAVA FILTER;  Surgeon: Waynetta Sandy, MD;  Location: [place];  Service: Vascular;  Laterality: N/A;   Social History   Socioeconomic History  . Marital status: Divorced    Spouse name: Not  on file  . Number of children: Not on file  . Years of education: Not on file  . Highest education level: Not on file  Occupational History  . Occupation: Disabled  Tobacco Use  . Smoking status: Former Smoker    Packs/day: 0.25    Years: 40.00    Pack years: 10.00    Types: Cigarettes    Quit date: 05/27/2013    Years since quitting: 6.2  . Smokeless tobacco: Never  Used  Substance and Sexual Activity  . Alcohol use: No  . Drug use: No  . Sexual activity: Not on file  Other Topics Concern  . Not on file  Social History Narrative  . Not on file   Social Determinants of Health   Financial Resource Strain:   . Difficulty of Paying Living Expenses:   Food Insecurity:   . Worried About Charity fundraiser in the Last Year:   . Arboriculturist in the Last Year:   Transportation Needs:   . Film/video editor (Medical):   Marland Kitchen Lack of Transportation (Non-Medical):   Physical Activity:   . Days of Exercise per Week:   . Minutes of Exercise per Session:   Stress:   . Feeling of Stress :   Social Connections:   . Frequency of Communication with Friends and Family:   . Frequency of Social Gatherings with Friends and Family:   . Attends Religious Services:   . Active Member of Clubs or Organizations:   . Attends Archivist Meetings:   Marland Kitchen Marital Status:   Intimate Partner Violence:   . Fear of Current or Ex-Partner:   . Emotionally Abused:   Marland Kitchen Physically Abused:   . Sexually Abused:    family history includes Coronary artery disease in her brother and mother; Diabetes in her mother; Emphysema in her father.  Current Facility-Administered Medications:  .  acetaminophen (TYLENOL) tablet 650 mg, 650 mg, Oral, Q6H PRN **OR** acetaminophen (TYLENOL) suppository 650 mg, 650 mg, Rectal, Q6H PRN, Karmen Bongo, MD .  albuterol (PROVENTIL) (2.5 MG/3ML) 0.083% nebulizer solution 3 mL, 3 mL, Inhalation, Q6H PRN, Karmen Bongo, MD .  Derrill Memo ON 08/28/2019] ferrous sulfate tablet 325 mg, 325 mg, Oral, Q breakfast, Karmen Bongo, MD .  HYDROcodone-acetaminophen (NORCO/VICODIN) 5-325 MG per tablet 1-2 tablet, 1-2 tablet, Oral, Q4H PRN, Karmen Bongo, MD .  lactated ringers infusion, , Intravenous, Continuous, Karmen Bongo, MD .  LORazepam (ATIVAN) tablet 0.5 mg, 0.5 mg, Oral, QHS, Karmen Bongo, MD .  mometasone-formoterol [facility]) 200-5  MCG/ACT inhaler 2 puff, 2 puff, Inhalation, BID, Karmen Bongo, MD .  Derrill Memo ON 08/28/2019] nitrofurantoin (macrocrystal-monohydrate) (MACROBID) capsule 100 mg, 100 mg, Oral, Daily, Karmen Bongo, MD .  ondansetron [facility]) tablet 4 mg, 4 mg, Oral, Q6H PRN **OR** ondansetron (ZOFRAN) injection 4 mg, 4 mg, Intravenous, Q6H PRN, Karmen Bongo, MD .  Derrill Memo ON 08/28/2019] pantoprazole (PROTONIX) EC tablet 40 mg, 40 mg, Oral, Daily, Karmen Bongo, MD .  Derrill Memo ON 08/28/2019] potassium chloride (KLOR-CON) CR tablet 10 mEq, 10 mEq, Oral, Daily, Karmen Bongo, MD .  Tiotropium Bromide Monohydrate AERS 1 puff, 1 puff, Inhalation, Q lunch, Karmen Bongo, MD .  traZODone (DESYREL) tablet 100 mg, 100 mg, Oral, Ivery Quale, MD .  Derrill Memo ON 08/28/2019] triamterene-hydrochlorothiazide (MAXZIDE-25) 37.5-25 MG per tablet 0.5 tablet, 0.5 tablet, Oral, Daily, Karmen Bongo, MD  Current Outpatient Medications:  .  albuterol (PROVENTIL) (2.5 MG/3ML) 0.083% nebulizer solution, Take 3 mLs (2.5 mg total)  by nebulization every 6 (six) hours as needed for wheezing or shortness of breath., Disp: 75 mL, Rfl: 12 .  albuterol (VENTOLIN HFA) 108 (90 Base) MCG/ACT inhaler, Inhale 1-2 puffs into the lungs every 6 (six) hours as needed for wheezing or shortness of breath., Disp: , Rfl:  .  apixaban (ELIQUIS) 5 MG TABS tablet, Take 5 mg by mouth 2 (two) times daily., Disp: , Rfl:  .  aspirin EC 81 MG EC tablet, Take 1 tablet (81 mg total) by mouth daily., Disp: 30 tablet, Rfl: 0 .  ferrous sulfate 325 (65 FE) MG tablet, Take 325 mg by mouth daily with breakfast., Disp: , Rfl:  .  Fluticasone-Salmeterol (ADVAIR) 250-50 MCG/DOSE AEPB, INHALE 1 DOSE BY MOUTH TWICE DAILY RINSE  MOUTH  AFTER  USE (Patient taking differently: Inhale 1 puff into the lungs in the morning and at bedtime. , RINSE MOUTH AFTER USE), Disp: 60 each, Rfl: 5 .  LORazepam (ATIVAN) 1 MG tablet, Take 0.5-1 mg by mouth at bedtime. Additional 1 mg if  needed, Disp: , Rfl:  .  nitrofurantoin, macrocrystal-monohydrate, (MACROBID) 100 MG capsule, Take 100 mg by mouth daily., Disp: , Rfl:  .  omeprazole (PRILOSEC) 10 MG capsule, Take 10 mg by mouth daily. , Disp: , Rfl:  .  potassium chloride (KLOR-CON) 8 MEQ tablet, Take 8 mEq by mouth daily., Disp: , Rfl:  .  Tiotropium Bromide Monohydrate (SPIRIVA RESPIMAT) 2.5 MCG/ACT AERS, Inhale 1 puff into the lungs daily with lunch. , Disp: , Rfl:  .  traZODone (DESYREL) 100 MG tablet, Take 100 mg by mouth at bedtime. , Disp: , Rfl:  .  triamterene-hydrochlorothiazide (MAXZIDE-25) 37.5-25 MG tablet, Take 0.5 tablets by mouth daily., Disp: , Rfl:  Allergies  Allergen Reactions  . Clarithromycin Swelling  . Penicillins Swelling    Tongue swelling Did it involve swelling of the face/tongue/throat, SOB, or low BP? Yes Did it involve sudden or severe rash/hives, skin peeling, or any reaction on the inside of your mouth or nose? Yes Did you need to seek medical attention at a hospital or doctor's office? Yes When did it last happen?childhood If all above answers are "NO", may proceed with cephalosporin use.   . Pneumococcal Vaccines Other (See Comments)    Couldn't breathe  . Sulfonamide Derivatives Swelling  . Estradiol Rash    Cream gives her a rash     Objective:     BP 122/83   Pulse 92   Temp 98.1 F (36.7 C)   Resp 19   SpO2 95%   She is alert and oriented and is in no distress.  Heart regular rhythm no murmurs.  Lungs clear  Abdomen: Bowel sounds normal, soft, nontender, no hepatosplenomegaly  Laboratory No components found for: D1    Assessment:     Lower GI bleed  The etiology of this I think is either from diverticulosis or that a scab fell off the site of argon plasma coagulation in the cecum resulting in bleeding.  Either of these situations would be aggravated by the use of Eliquis which she has been taking.  Her last dose of this was yesterday.      Plan:     I  would recommend observation for now.  Supportive care.  Following hemoglobin and hematocrit.  Holding Eliquis.  If further bleeding continued I would do a nuclear medicine GI bleeding scan.  If it showed bleeding from the left colon from the diverticular area I think that  consulting IR for angiogram with embolization would be the right thing to do.  If on the other hand it showed bleeding from the cecum we would more than likely have to clean her out and do a colonoscopy with clipping or cauterization of the bleeding if it were in the cecum. Lab Results  Component Value Date   HGB 9.9 (L) 08/27/2019   HGB 9.7 (L) 07/28/2019   HGB 14.6 03/13/2019   HCT 35.6 (L) 08/27/2019   HCT 32.6 (L) 07/28/2019   HCT 46.0 03/13/2019   ALKPHOS 58 08/27/2019   ALKPHOS 55 07/28/2019   ALKPHOS 65 03/13/2019   AST 28 08/27/2019   AST 15 07/28/2019   AST 20 03/13/2019   AST 29 01/28/2019   AST 16 01/14/2019   ALT 21 08/27/2019   ALT 12 07/28/2019   ALT 14 03/13/2019   ALT 19 01/28/2019   ALT 14 01/14/2019

## 2019-08-27 NOTE — ED Provider Notes (Signed)
[facility] EMERGENCY DEPARTMENT Provider Note   CSN: [id number] Arrival date & time: 08/27/19  0720     History Chief Complaint  Patient presents with  . Hematochezia    [name] is a 79 y.o. female.  The history is provided by the patient.  Rectal Bleeding Quality:  Bright red Amount:  Moderate Timing:  Intermittent Chronicity:  Recurrent Context: spontaneously   Context: not hemorrhoids and not rectal pain   Similar prior episodes: no   Relieved by:  Nothing Worsened by:  Nothing Associated symptoms: no abdominal pain, no dizziness, no epistaxis, no fever, no hematemesis, no light-headedness, no loss of consciousness, no recent illness and no vomiting   Risk factors: anticoagulant use        Past Medical History:  Diagnosis Date  . Allergic rhinitis   . Anemia due to GI blood loss   . Asthma   . Chronic airway obstruction, not elsewhere classified   . Depressive disorder, not elsewhere classified   . DVT (deep venous thrombosis) (HCC)    LLE DVT 01/13/19  . Esophageal reflux   . PE (pulmonary thromboembolism) ([place]) 01/29/2019  . Pneumonia   . Wears partial dentures     Patient Active Problem List   Diagnosis Date Noted  . DVT, recurrent, lower extremity, chronic, left 01/28/2019  . Acute pulmonary embolism with acute cor pulmonale ([place]) 01/28/2019  . Hypokalemia 01/28/2019  . Retroperitoneal bleed 01/13/2019  . CAP (community acquired pneumonia) 05/27/2013  . SOB (shortness of breath) 05/27/2013  . Myalgia 05/27/2013  . COPD with acute exacerbation ([place]) 05/27/2013  . Acute respiratory failure with hypoxia ([place]) 05/27/2013  . Vertebral compression fracture ([place]) 09/15/2010  . SCIATICA 12/16/2007  . COPD mixed type ([place]) 07/17/2007  . ALLERGIC RHINITIS 07/16/2007  . BRONCHITIS 07/16/2007  . ASTHMA 07/16/2007  . Esophageal reflux 07/16/2007    Past Surgical History:  Procedure Laterality Date  . ABDOMINAL HYSTERECTOMY    .  ABDOMINAL HYSTERECTOMY    . CATARACT EXTRACTION W/ INTRAOCULAR LENS  IMPLANT, BILATERAL    . COLONOSCOPY    . COLONOSCOPY WITH PROPOFOL N/A 08/20/2019   Procedure: COLONOSCOPY WITH PROPOFOL;  Surgeon: Ronald Lobo, MD;  Location: [place];  Service: Endoscopy;  Laterality: N/A;  . CYSTOSCOPY    . DILATION AND CURETTAGE OF UTERUS    . ESOPHAGOGASTRODUODENOSCOPY (EGD) WITH PROPOFOL N/A 08/20/2019   Procedure: ESOPHAGOGASTRODUODENOSCOPY (EGD) WITH PROPOFOL;  Surgeon: Ronald Lobo, MD;  Location: [facility];  Service: Endoscopy;  Laterality: N/A;  . HEMOSTASIS CONTROL  08/20/2019   Procedure: HEMOSTASIS CONTROL;  Surgeon: Ronald Lobo, MD;  Location: [facility];  Service: Endoscopy;;  . INTRAVASCULAR ULTRASOUND/IVUS Left 05/26/2019   Procedure: Intravascular Ultrasound/IVUS;  Surgeon: Waynetta Sandy, MD;  Location: [place] [facility];  Service: Cardiovascular;  Laterality: Left;  . IVC FILTER REMOVAL N/A 03/24/2019   Procedure: IVC FILTER REMOVAL;  Surgeon: Waynetta Sandy, MD;  Location: [place] [facility];  Service: Cardiovascular;  Laterality: N/A;  . LOWER EXTREMITY VENOGRAPHY Left 05/26/2019   Procedure: LOWER EXTREMITY VENOGRAPHY;  Surgeon: Waynetta Sandy, MD;  Location: [place] [facility];  Service: Cardiovascular;  Laterality: Left;  Marland Kitchen MULTIPLE TOOTH EXTRACTIONS    . NASAL SINUS SURGERY    . PERIPHERAL VASCULAR INTERVENTION Left 05/26/2019   Procedure: PERIPHERAL VASCULAR INTERVENTION;  Surgeon: Waynetta Sandy, MD;  Location: [place] [facility];  Service: Cardiovascular;  Laterality: Left;  lower extremity veins  . PERIPHERAL VASCULAR  THROMBECTOMY  05/26/2019   Procedure: PERIPHERAL VASCULAR THROMBECTOMY;  Surgeon: Waynetta Sandy, MD;  Location: [place] [facility];  Service: Cardiovascular;;  . TUBAL LIGATION    . VENA CAVA FILTER PLACEMENT N/A 01/13/2019   Procedure: INSERTION VENA-CAVA FILTER;  Surgeon: Waynetta Sandy, MD;  Location: [place];  Service: Vascular;  Laterality: N/A;     OB History   No obstetric history on file.     Family History  Problem Relation Age of Onset  . Diabetes Mother   . Coronary artery disease Mother   . Emphysema Father   . Coronary artery disease Brother     Social History   Tobacco Use  . Smoking status: Former Smoker    Packs/day: 0.25    Years: 40.00    Pack years: 10.00    Types: Cigarettes    Quit date: 05/27/2013    Years since quitting: 6.2  . Smokeless tobacco: Never Used  Substance Use Topics  . Alcohol use: No  . Drug use: No    Home Medications Prior to Admission medications   Medication Sig Start Date End Date Taking? Authorizing Provider  albuterol (PROVENTIL) (2.5 MG/3ML) 0.083% nebulizer solution Take 3 mLs (2.5 mg total) by nebulization every 6 (six) hours as needed for wheezing or shortness of breath. 06/30/19  Yes Young, Tarri Fuller D, MD  albuterol (VENTOLIN HFA) 108 (90 Base) MCG/ACT inhaler Inhale 1-2 puffs into the lungs every 6 (six) hours as needed for wheezing or shortness of breath.   Yes [provider]  apixaban (ELIQUIS) 5 MG TABS tablet Take 5 mg by mouth 2 (two) times daily.   Yes [provider]  aspirin EC 81 MG EC tablet Take 1 tablet (81 mg total) by mouth daily. 01/17/19  Yes Bonnielee Haff, MD  ferrous sulfate 325 (65 FE) MG tablet Take 325 mg by mouth daily with breakfast.   Yes [provider]  Fluticasone-Salmeterol (ADVAIR) 250-50 MCG/DOSE AEPB INHALE 1 DOSE BY MOUTH TWICE DAILY RINSE  MOUTH  AFTER  USE Patient taking differently: Inhale 1 puff into the lungs in the morning and at bedtime. , RINSE MOUTH AFTER USE 06/13/19  Yes Young, Clinton D, MD  LORazepam (ATIVAN) 1 MG tablet Take 0.5-1 mg by mouth at bedtime. Additional 1 mg if needed   Yes [provider]  nitrofurantoin, macrocrystal-monohydrate, (MACROBID) 100 MG capsule Take 100 mg by mouth daily. 08/18/19  Yes [provider]  omeprazole (PRILOSEC) 10 MG capsule Take 10 mg by mouth daily.  09/13/11  Yes [provider]  potassium chloride (KLOR-CON) 8 MEQ tablet Take 8 mEq by mouth daily. 02/07/19  Yes [provider]  Tiotropium Bromide Monohydrate (SPIRIVA RESPIMAT) 2.5 MCG/ACT AERS Inhale 1 puff into the lungs daily with lunch.    Yes [provider]  traZODone (DESYREL) 100 MG tablet Take 100 mg by mouth at bedtime.    Yes [provider]  triamterene-hydrochlorothiazide (MAXZIDE-25) 37.5-25 MG tablet Take 0.5 tablets by mouth daily. 02/06/19  Yes [provider]    Allergies    Clarithromycin, Penicillins, Pneumococcal vaccines, Sulfonamide derivatives, and Estradiol  Review of Systems   Review of Systems  Constitutional: Negative for chills and fever.  HENT: Negative for ear pain, nosebleeds and sore throat.   Eyes: Negative for pain and visual disturbance.  Respiratory: Negative for cough and shortness of breath.   Cardiovascular: Negative for chest pain and palpitations.  Gastrointestinal: Positive for blood in stool (  multiple episode of bright red bloody stools for the last 12 hours on and off) and hematochezia. Negative for abdominal distention, abdominal pain, anal bleeding, constipation, diarrhea, hematemesis, nausea, rectal pain and vomiting.  Genitourinary: Negative for dysuria and hematuria.  Musculoskeletal: Negative for arthralgias and back pain.  Skin: Negative for color change and rash.  Neurological: Negative for dizziness, seizures, loss of consciousness, syncope and light-headedness.  All other systems reviewed and are negative.   Physical Exam Updated Vital Signs  ED Triage Vitals  Enc Vitals Group     BP 08/27/19 0735 130/90     Pulse Rate 08/27/19 0735 (!) 108     Resp 08/27/19 0735 18     Temp 08/27/19 0735 98.1 F (36.7 C)     Temp src --      SpO2 08/27/19 0735 96 %     Weight --      Height --      Head  Circumference --      Peak Flow --      Pain Score 08/27/19 0732 6     Pain Loc --      Pain Edu? --      Excl. in [place]? --     Physical Exam Vitals and nursing note reviewed.  Constitutional:      General: She is not in acute distress.    Appearance: She is well-developed. She is not ill-appearing.  HENT:     Head: Normocephalic and atraumatic.     Nose: Nose normal.     Mouth/Throat:     Mouth: Mucous membranes are moist.  Eyes:     Extraocular Movements: Extraocular movements intact.     Pupils: Pupils are equal, round, and reactive to light.     Comments: Pale conjuctiva  Cardiovascular:     Rate and Rhythm: Regular rhythm. Tachycardia present.     Pulses: Normal pulses.     Heart sounds: Normal heart sounds. No murmur.  Pulmonary:     Effort: Pulmonary effort is normal. No respiratory distress.     Breath sounds: Normal breath sounds.  Abdominal:     Palpations: Abdomen is soft.     Tenderness: There is no abdominal tenderness.  Genitourinary:    Rectum: Guaiac result positive (grossly red stool intermix with blackish stool).  Musculoskeletal:     Cervical back: Neck supple.  Skin:    General: Skin is warm and dry.     Capillary Refill: Capillary refill takes less than 2 seconds.  Neurological:     General: No focal deficit present.     Mental Status: She is alert.  Psychiatric:        Mood and Affect: Mood normal.     ED Results / Procedures / Treatments   Labs (all labs ordered are listed, but only abnormal results are displayed) Labs Reviewed  COMPREHENSIVE METABOLIC PANEL - Abnormal; Notable for the following components:      Result Value   CO2 21 (*)    Glucose, Bld 187 (*)    Calcium 8.3 (*)    Total Protein 6.0 (*)    GFR calc non Af Amer 56 (*)    All other components within normal limits  CBC - Abnormal; Notable for the following components:   Hemoglobin 9.9 (*)    HCT 35.6 (*)    MCH 24.4 (*)    MCHC 27.8 (*)    RDW 21.7 (*)    Platelets  449 (*)  All other components within normal limits  POC OCCULT BLOOD, ED - Abnormal; Notable for the following components:   Fecal Occult Bld POSITIVE (*)    All other components within normal limits  RESPIRATORY PANEL BY RT PCR (FLU A&B, COVID)  TYPE AND SCREEN    EKG None  Radiology No results found.  Procedures Procedures (including critical care time)  Medications Ordered in ED Medications - No data to display  ED Course  I have reviewed the triage vital signs and the nursing notes.  Pertinent labs & imaging results that were available during my care of the patient were reviewed by me and considered in my medical decision making (see chart for details).    MDM Rules/Calculators/A&P                      [name] is a 79 year old female with history of PE, DVT on Eliquis who presents to the ED with hematochezia.  Patient with unremarkable vitals.  No fever.  Had ablation done to an AVM last week.  Had been doing well but started to have bright red blood per rectum last night.  Multiple episodes throughout the night.  Denies any chest pain, shortness of breath.  Dr. Cristina Gong did colonoscopy and EGD last week and treated the AVM.  Patient continues on Eliquis now.  Has some bright red blood in her stool on exam.  However hemoglobin, BUN, creatinine is overall unremarkable here.  She is mildly tachycardic.  Talked with Dr. Watt Climes with [facility] who will evaluate the patient and will have the patient admitted to the hospitalist to continue observation, trend hemoglobin as concern for possible rebleed.  She did have some diverticuli on colonoscopy as well and could also be reason for bleed.  Does not have any abdominal pain.  No need for CT scan at this time.  Admitted to hospitalist service in good condition.  Covid test obtained in case patient needed more emergent/urgent colonoscopy or invasive test.  This chart was dictated using voice recognition software.  Despite best efforts  to proofread,  errors can occur which can change the documentation meaning.    Final Clinical Impression(s) / ED Diagnoses Final diagnoses:  Hematochezia    Rx / DC Orders ED Discharge Orders    None       Lennice Sites, DO 08/27/19 [id number]

## 2019-08-27 NOTE — ED Triage Notes (Signed)
Pt here from home with c/o gi bleed .pt has a colonocsopy last wed and had two spots fixed  , pt states that she started passing blood again last night

## 2019-08-27 NOTE — H&P (Addendum)
History and Physical    [name] [id number] DOB: 1940-10-26 DOA: 08/27/2019  PCP: Deland Pretty, MD Consultants:  Buccini - GI; Young - pulmonology; Donzetta Matters - vascular; Irene Limbo - hematology Patient coming from:  Home - lives alone; NOK: [place], [place], [phone]  Chief Complaint:  Lower GI bleeding  HPI: [name] is a 79 y.o. female with medical history significant of PE (01/2019, s/p thrombectomy/stents, with IVC filter and on ASA and Eliquis); COPD; depression; and recent anemia with heme positive stools.  She had EGD/colonoscopy by Dr. Cristina Gong on 3/24 with a single non-bleeding colonic angiodysplastic lesion treated with argon plasma coagulation and an unremarkable EGD.  She is presenting today with recurrent lower GI bleeding.   She has been feeling very weak, has had iron infusions.  She had not further bleeding again until last night.  She thought she had diarrhea last night and there was "blood everywhere."  She thinks she had 5-6 stools in total.  Her last bloody stool was about 645 this AM.  No abdominal pain.  +DOE.  She does have a bit of a scratchy throat and dry cough today.  She had never had a prior clot but had a large clot in Sept.  She ended up with stents and an IVC filter but filter was removed.  She is still taking ASA and Eliquis.    In review of records, she had a LLE DVT that was complicated by a retroperitoneal bleed - and so was unable to receive AC.  IVC filter was placed and she was started on ASA.  She subsequently developed extensive PE with R heart strain.  She was started on heparin and transitioned to Eliquis.  She saw Dr. Irene Limbo in October and he recommended at least 1 year of [facility] and likely indefinite since the source of the clots was unknown.  In December she had mechanical thrombectomy, balloon angioplasty, and stent placement in L common iliac, L internal iliac, and L common femoral veins.    ED Course:  BRBPR since midnight.  C-scope/EGD last week with  AVM.  Back on Eliquis.  No more BMs since here.  Hgb stable.  Dr. Watt Climes to see.  Last Eliquis dose was last night.  Review of Systems: As per HPI; otherwise review of systems reviewed and negative.   Ambulatory Status:  Ambulates without assistance  COVID Vaccine Status:   Complete  Past Medical History:  Diagnosis Date  . Allergic rhinitis   . Anemia due to GI blood loss   . Asthma   . Chronic airway obstruction, not elsewhere classified   . Depressive disorder, not elsewhere classified   . DVT (deep venous thrombosis) (HCC)    LLE DVT 01/13/19  . Esophageal reflux   . Essential hypertension 08/27/2019  . PE (pulmonary thromboembolism) ([place]) 01/29/2019  . Pneumonia   . Wears partial dentures     Past Surgical History:  Procedure Laterality Date  . ABDOMINAL HYSTERECTOMY    . ABDOMINAL HYSTERECTOMY    . CATARACT EXTRACTION W/ INTRAOCULAR LENS  IMPLANT, BILATERAL    . COLONOSCOPY    . COLONOSCOPY WITH PROPOFOL N/A 08/20/2019   Procedure: COLONOSCOPY WITH PROPOFOL;  Surgeon: Ronald Lobo, MD;  Location: [place];  Service: Endoscopy;  Laterality: N/A;  . CYSTOSCOPY    . DILATION AND CURETTAGE OF UTERUS    . ESOPHAGOGASTRODUODENOSCOPY (EGD) WITH PROPOFOL N/A 08/20/2019   Procedure: ESOPHAGOGASTRODUODENOSCOPY (EGD) WITH PROPOFOL;  Surgeon: Ronald Lobo, MD;  Location[facility]  ENDOSCOPY;  Service: Endoscopy;  Laterality: N/A;  . HEMOSTASIS CONTROL  08/20/2019   Procedure: HEMOSTASIS CONTROL;  Surgeon: Ronald Lobo, MD;  Location: [facility] ENDOSCOPY;  Service: Endoscopy;;  . INTRAVASCULAR ULTRASOUND/IVUS Left 05/26/2019   Procedure: Intravascular Ultrasound/IVUS;  Surgeon: Waynetta Sandy, MD;  Location: [place] CV LAB;  Service: Cardiovascular;  Laterality: Left;  . IVC FILTER REMOVAL N/A 03/24/2019   Procedure: IVC FILTER REMOVAL;  Surgeon: Waynetta Sandy, MD;  Location: [place] CV LAB;  Service: Cardiovascular;  Laterality: N/A;  . LOWER EXTREMITY  VENOGRAPHY Left 05/26/2019   Procedure: LOWER EXTREMITY VENOGRAPHY;  Surgeon: Waynetta Sandy, MD;  Location: [place] [facility];  Service: Cardiovascular;  Laterality: Left;  Marland Kitchen MULTIPLE TOOTH EXTRACTIONS    . NASAL SINUS SURGERY    . PERIPHERAL VASCULAR INTERVENTION Left 05/26/2019   Procedure: PERIPHERAL VASCULAR INTERVENTION;  Surgeon: Waynetta Sandy, MD;  Location: [place] [facility] LAB;  Service: Cardiovascular;  Laterality: Left;  lower extremity veins  . PERIPHERAL VASCULAR THROMBECTOMY  05/26/2019   Procedure: PERIPHERAL VASCULAR THROMBECTOMY;  Surgeon: Waynetta Sandy, MD;  Location: [place] [facility] LAB;  Service: Cardiovascular;;  . TUBAL LIGATION    . VENA CAVA FILTER PLACEMENT N/A 01/13/2019   Procedure: INSERTION VENA-CAVA FILTER;  Surgeon: Waynetta Sandy, MD;  Location: [place];  Service: Vascular;  Laterality: N/A;    Social History   Socioeconomic History  . Marital status: Divorced    Spouse name: Not on file  . Number of children: Not on file  . Years of education: Not on file  . Highest education level: Not on file  Occupational History  . Occupation: Disabled  Tobacco Use  . Smoking status: Former Smoker    Packs/day: 0.25    Years: 40.00    Pack years: 10.00    Types: Cigarettes    Quit date: 05/27/2013    Years since quitting: 6.2  . Smokeless tobacco: Never Used  Substance and Sexual Activity  . Alcohol use: No  . Drug use: No  . Sexual activity: Not on file  Other Topics Concern  . Not on file  Social History Narrative  . Not on file   Social Determinants of Health   Financial Resource Strain:   . Difficulty of Paying Living Expenses:   Food Insecurity:   . Worried About Charity fundraiser in the Last Year:   . Arboriculturist in the Last Year:   Transportation Needs:   . Film/video editor (Medical):   Marland Kitchen Lack of Transportation (Non-Medical):   Physical Activity:   . Days of Exercise per Week:   .  Minutes of Exercise per Session:   Stress:   . Feeling of Stress :   Social Connections:   . Frequency of Communication with Friends and Family:   . Frequency of Social Gatherings with Friends and Family:   . Attends Religious Services:   . Active Member of Clubs or Organizations:   . Attends Archivist Meetings:   Marland Kitchen Marital Status:   Intimate Partner Violence:   . Fear of Current or Ex-Partner:   . Emotionally Abused:   Marland Kitchen Physically Abused:   . Sexually Abused:     Allergies  Allergen Reactions  . Clarithromycin Swelling  . Penicillins Swelling    Tongue swelling Did it involve swelling of the face/tongue/throat, SOB, or low BP? Yes Did it involve sudden or severe rash/hives, skin peeling, or any reaction on  the inside of your mouth or nose? Yes Did you need to seek medical attention at a hospital or doctor's office? Yes When did it last happen?childhood If all above answers are "NO", may proceed with cephalosporin use.   . Pneumococcal Vaccines Other (See Comments)    Couldn't breathe  . Sulfonamide Derivatives Swelling  . Estradiol Rash    Cream gives her a rash    Family History  Problem Relation Age of Onset  . Diabetes Mother   . Coronary artery disease Mother   . Emphysema Father   . Coronary artery disease Brother     Prior to Admission medications   Medication Sig Start Date End Date Taking? Authorizing Provider  albuterol (PROVENTIL) (2.5 MG/3ML) 0.083% nebulizer solution Take 3 mLs (2.5 mg total) by nebulization every 6 (six) hours as needed for wheezing or shortness of breath. 06/30/19  Yes Young, Tarri Fuller D, MD  albuterol (VENTOLIN HFA) 108 (90 Base) MCG/ACT inhaler Inhale 1-2 puffs into the lungs every 6 (six) hours as needed for wheezing or shortness of breath.   Yes [provider]  apixaban (ELIQUIS) 5 MG TABS tablet Take 5 mg by mouth 2 (two) times daily.   Yes [provider]  aspirin EC 81 MG EC tablet Take 1 tablet (81 mg  total) by mouth daily. 01/17/19  Yes Bonnielee Haff, MD  ferrous sulfate 325 (65 FE) MG tablet Take 325 mg by mouth daily with breakfast.   Yes [provider]  Fluticasone-Salmeterol (ADVAIR) 250-50 MCG/DOSE AEPB INHALE 1 DOSE BY MOUTH TWICE DAILY RINSE  MOUTH  AFTER  USE Patient taking differently: Inhale 1 puff into the lungs in the morning and at bedtime. , RINSE MOUTH AFTER USE 06/13/19  Yes Young, Clinton D, MD  LORazepam (ATIVAN) 1 MG tablet Take 0.5-1 mg by mouth at bedtime. Additional 1 mg if needed   Yes [provider]  nitrofurantoin, macrocrystal-monohydrate, (MACROBID) 100 MG capsule Take 100 mg by mouth daily. 08/18/19  Yes [provider]  omeprazole (PRILOSEC) 10 MG capsule Take 10 mg by mouth daily.  09/13/11  Yes [provider]  potassium chloride (KLOR-CON) 8 MEQ tablet Take 8 mEq by mouth daily. 02/07/19  Yes [provider]  Tiotropium Bromide Monohydrate (SPIRIVA RESPIMAT) 2.5 MCG/ACT AERS Inhale 1 puff into the lungs daily with lunch.    Yes [provider]  traZODone (DESYREL) 100 MG tablet Take 100 mg by mouth at bedtime.    Yes [provider]  triamterene-hydrochlorothiazide (MAXZIDE-25) 37.5-25 MG tablet Take 0.5 tablets by mouth daily. 02/06/19  Yes [provider]    Physical Exam: Vitals:   08/27/19 0930 08/27/19 0945 08/27/19 1518 08/27/19 1825  BP: 131/86 122/83 117/75 104/62  Pulse: 94 92 89 90  Resp: (!) 23 19 18 16   Temp:   98.1 F (36.7 C) 98.2 F (36.8 C)  TempSrc:   Oral Oral  SpO2: 94% 95% 97% 94%     . General:  Appears calm and comfortable and is NAD . Eyes:  PERRL, EOMI, normal lids, iris . ENT:  grossly normal hearing, lips & tongue, mmm; appropriate dentition . Neck:  no LAD, masses or thyromegaly . Cardiovascular:  RRR, no m/r/g. No LE edema.  Marland Kitchen Respiratory:   CTA bilaterally with no wheezes/rales/rhonchi.  Normal respiratory effort. . Abdomen:  soft, NT, ND,  NABS . Skin:  no rash or induration seen on limited exam . Musculoskeletal:  grossly normal tone BUE/BLE, good ROM, no  bony abnormality . Psychiatric:  grossly normal mood and affect, speech fluent and appropriate, AOx3 . Neurologic:  CN 2-12 grossly intact, moves all extremities in coordinated fashion    Radiological Exams on Admission: No results found.  EKG: not done   Labs on Admission: I have personally reviewed the available labs and imaging studies at the time of the admission.  Pertinent labs:   CO2 21 Glucose 187 BUN 18/Creatinine 0.98/GFR 56 - stable WBC 5.4 Hgb 9.9; 9.7 on 3/1 Heme positive COVID negative   Assessment/Plan Principal Problem:   Acute lower GI bleeding Active Problems:   COPD mixed type (HCC)   History of pulmonary embolism   Hyperglycemia   Essential hypertension    Lower GI bleeding -Patient with colonoscopy and EGD on 3/24 showing angiodysplastic lesion treated with argon plasma coagulation -She is presenting with acute onset of recurrent BRBPR -This appears likely to be either another AVM or rebleeding of the one treated last week -Her bleeding has subsided for now so overnight monitoring without further intervention may be appropriate -However, if she rebleeds she will need repeat colonoscopy -Overnight Observation  -CBC at 1700 and in AM; transfuse for Hgb <7 -Continue to monitor for recurrent bleeding  -Continue oral PPI since this is very unlikely to be upper GI tract bleeding  Recent PE -Patient with complicated history with extensive PE -While this was her first PE and so 6-12 months AC could be considered, given that it was unprovoked and the extensive nature of the clot hematology has recommended lifelong AC -For now, will hold Eliquis without bridge therapy and continue to monitor -Will also hold ASA although it may be reasonable to start this back sooner than Eliquis -She is scheduled to travel to [place] in 2 weeks and will  need to resume Eliquis prior to travel, if possible  Hyperglycemia -May be stress response -Will follow with fasting AM labs and check A1c -Will not provide active treatment for this issue at this time  COPD -Continue Albuterol HFA, Advair, Spiriva  HTN -Continue Maxzide  UTI prophylaxis -Continue daily Macrobid    Note: This patient has been tested and is negative for the novel coronavirus COVID-19.  DVT prophylaxis:  SCDs Code Status:  Full - confirmed with patient/family Family Communication: Her daughter was present throughout the evaluation Disposition Plan: She is anticipated to d/c to home without [facility] services once her GI issues have been resolved. Consults called: GI  Admission status: It is my clinical opinion that referral for OBSERVATION is reasonable and necessary in this patient based on the above information provided. The aforementioned taken together are felt to place the patient at high risk for further clinical deterioration. However it is anticipated that the patient may be medically stable for discharge from the hospital within 24 to 48 hours.     Karmen Bongo MD [facility]   How to contact the [facility] Attending or Consulting provider [place] or covering provider during after hours [place], for this patient?  1. Check the care team in [facility] and look for a) attending/consulting [facility] provider listed and b) the [facility] team listed 2. Log into [email or web address] and use [place]'s universal password to access. If you do not have the password, please contact the hospital operator. 3. Locate the [facility] provider you are looking for under [facility] and page to a number that you can be directly reached. 4. If you still have difficulty reaching the provider, please page the [facility] (Director  on Call) for the Hospitalists listed on amion for assistance.   08/27/2019, 6:54 PM

## 2019-08-28 DIAGNOSIS — Z86711 Personal history of pulmonary embolism: Secondary | ICD-10-CM | POA: Diagnosis not present

## 2019-08-28 DIAGNOSIS — R739 Hyperglycemia, unspecified: Secondary | ICD-10-CM

## 2019-08-28 DIAGNOSIS — I10 Essential (primary) hypertension: Secondary | ICD-10-CM

## 2019-08-28 DIAGNOSIS — J449 Chronic obstructive pulmonary disease, unspecified: Secondary | ICD-10-CM

## 2019-08-28 DIAGNOSIS — K922 Gastrointestinal hemorrhage, unspecified: Secondary | ICD-10-CM | POA: Diagnosis not present

## 2019-08-28 LAB — BASIC METABOLIC PANEL
Anion gap: 10 (ref 5–15)
BUN: 13 mg/dL (ref 8–23)
CO2: 26 mmol/L (ref 22–32)
Calcium: 8.6 mg/dL — ABNORMAL LOW (ref 8.9–10.3)
Chloride: 105 mmol/L (ref 98–111)
Creatinine, Ser: 0.93 mg/dL (ref 0.44–1.00)
GFR calc Af Amer: >60 mL/min (ref >60)
GFR calc non Af Amer: 60 mL/min — ABNORMAL LOW (ref >60)
Glucose, Bld: 99 mg/dL (ref 70–99)
Potassium: 4.2 mmol/L (ref 3.5–5.1)
Sodium: 141 mmol/L (ref 135–145)

## 2019-08-28 LAB — CBC
HCT: 31.4 % — ABNORMAL LOW (ref 36.0–46.0)
Hemoglobin: 9 g/dL — ABNORMAL LOW (ref 12.0–15.0)
MCH: 24.9 pg — ABNORMAL LOW (ref 26.0–34.0)
MCHC: 28.7 g/dL — ABNORMAL LOW (ref 30.0–36.0)
MCV: 87 fL (ref 80.0–100.0)
Platelets: 406 10*3/uL — ABNORMAL HIGH (ref 150–400)
RBC: 3.61 MIL/uL — ABNORMAL LOW (ref 3.87–5.11)
RDW: 22 % — ABNORMAL HIGH (ref 11.5–15.5)
WBC: 4.7 10*3/uL (ref 4.0–10.5)
nRBC: 0 % (ref 0.0–0.2)

## 2019-08-28 MED ORDER — APIXABAN 5 MG PO TABS: 5.0000 mg | ORAL_TABLET | Freq: Two times a day (BID) | ORAL | Status: DC

## 2019-08-28 NOTE — Progress Notes (Signed)
No further rectal bleeding since yesterday morning before she came into the hospital. I suspect her bleeding was either diverticular origin or that the scab fell off the area of the cecum where the angiodysplasia was. Eliquis has been held. I think we can advance her diet and watch her one more day. I would probably hold Eliquis for a few more days. We will sign off. Call us if needed.

## 2019-08-28 NOTE — Progress Notes (Signed)
Discharge teaching complete. Meds, diet, activity, follow up appointments reviewed and all questions answered. Copy of instructions given to patient. Patient discharged home with daughter in law.

## 2019-08-28 NOTE — Discharge Summary (Signed)
Physician Discharge Summary  KENNEISHA STANBACK [id number] DOB: [birth year] DOA: 08/27/2019  PCP: Deland Pretty, MD  Admit date: 08/27/2019 Discharge date: 08/28/2019  Admitted From: Home  Discharge disposition: Home  Recommendations for Outpatient Follow-Up:   . Follow up with your primary care provider in one week.  . Check CBC, BMP in the next visit . Eliquis on hold until 08/31/2019 due to recent GI bleed.  Discharge Diagnosis:   Principal Problem:   Acute lower GI bleeding Active Problems:   COPD mixed type (HCC)   History of pulmonary embolism   Hyperglycemia   Essential hypertension  Discharge Condition: Improved.  Diet recommendation: Low sodium, heart healthy.   Wound care: None.  Code status: Full.   History of Present Illness:   [name] is a 79 y.o. female with medical history significant of PE (01/2019, s/p thrombectomy/stents, with IVC filter and on ASA and Eliquis); COPD; depression; and recent anemia with heme positive stools.  She had EGD/colonoscopy by Dr. Cristina Gong on 3/24 with a single non-bleeding colonic angiodysplastic lesion treated with argon plasma coagulation and an unremarkable EGD.  She presented to hospital this time with bright red bleeding and feeling very fatigued.  Patient stated that she has been receiving IV iron infusion as outpatient.She had never had a prior clot but had a large clot in Sept.  She ended up with stents and an IVC filter but filter was removed.  She is still taking ASA and Eliquis.  In review of records, she had a LLE DVT that was complicated by a retroperitoneal bleed - and so was unable to receive AC.  IVC filter was placed and she was started on ASA.  She subsequently developed extensive PE with R heart strain.  She was started on heparin and transitioned to Eliquis.  She saw Dr. Irene Limbo in October and he recommended at least 1 year of [facility] and likely indefinite since the source of the clots was unknown.  In December she had  mechanical thrombectomy, balloon angioplasty, and stent placement in L common iliac, L internal iliac, and L common femoral veins.   Hospital Course:   Following conditions were addressed during hospitalization as listed below,  Lower GI bleeding Patient with recent  colonoscopy and EGD on 3/24 showing angiodysplastic lesion treated with argon plasma coagulation.  Patient has not had further episodes of bleeding after presenting the hospital and her hemoglobin has remained stable.  GI was consulted and recommended observation.  At this time patient has been advancing her oral diet.  GI has followed the patient prior to discharge and at this time patient is stable for disposition home.  GI recommends holding off with Eliquis for at least few days prior to initiation.  He was advised to seek medical attention for ongoing bleeding.  Hemoglobin prior to discharge was 9.0 from hemoglobin of 8.6 yesterday.  History of recent pulmonary embolism. -Patient with complicated history with extensive PE.  Will need to resume Eliquis on 08/31/2019.  Hyperglycemia Likely reactive.  Hemoglobin A1c of 4.9  COPD Compensated.  Continue Albuterol HFA, Advair, Spiriva on discharge  HTN -Continue Maxzide  UTI prophylaxis -Continue daily Macrobid  Disposition.  At this time, patient is stable for disposition home.  Patient was advised to follow-up with her primary care physician as outpatient.  Medical Consultants:    GI  Procedures:    None Subjective:   Today, patient denies rectal bleeding after coming to the hospital.  No nausea, vomiting abdominal  pain.  Hemoglobin has remained stable.  Discharge Exam:   Vitals:   08/28/19 0445 08/28/19 1215  BP: 108/74 106/62  Pulse: 85 99  Resp: 18 18  Temp: 98.2 F (36.8 C) 98 F (36.7 C)  SpO2: 92% 92%   Vitals:   08/27/19 2105 08/27/19 2343 08/28/19 0445 08/28/19 1215  BP:  113/67 108/74 106/62  Pulse:  90 85 99  Resp:  18 18 18   Temp:   98.2 F (36.8 C) 98.2 F (36.8 C) 98 F (36.7 C)  TempSrc:  Oral Oral Oral  SpO2: 90% 92% 92% 92%    General: Alert awake, not in obvious distress HENT: pupils equally reacting to light,  No scleral pallor or icterus noted. Oral mucosa is moist.  Chest:  Clear breath sounds.  Diminished breath sounds bilaterally. No crackles or wheezes.  CVS: S1 &S2 heard. No murmur.  Regular rate and rhythm. Abdomen: Soft, nontender, nondistended.  Bowel sounds are heard.   Extremities: No cyanosis, clubbing or edema.  Peripheral pulses are palpable. Psych: Alert, awake and oriented, normal mood CNS:  No cranial nerve deficits.  Power equal in all extremities.   Skin: Warm and dry.  No rashes noted.  The results of significant diagnostics from this hospitalization (including imaging, microbiology, ancillary and laboratory) are listed below for reference.     Diagnostic Studies:   No results found.   Labs:   Basic Metabolic Panel: Recent Labs  Lab 08/27/19 0740 08/28/19 0355  NA 138 141  K 4.1 4.2  CL 103 105  CO2 21* 26  GLUCOSE 187* 99  BUN 18 13  CREATININE 0.98 0.93  CALCIUM 8.3* 8.6*   GFR Estimated Creatinine Clearance: 52.9 mL/min (by C-G formula based on SCr of 0.93 mg/dL). Liver Function Tests: Recent Labs  Lab 08/27/19 0740  AST 28  ALT 21  ALKPHOS 58  BILITOT 0.4  PROT 6.0*  ALBUMIN 3.7   No results for input(s): LIPASE, AMYLASE in the last 168 hours. No results for input(s): AMMONIA in the last 168 hours. Coagulation profile No results for input(s): INR, PROTIME in the last 168 hours.  CBC: Recent Labs  Lab 08/27/19 0740 08/27/19 1657 08/28/19 0355  WBC 5.4 5.2 4.7  HGB 9.9* 8.6* 9.0*  HCT 35.6* 30.4* 31.4*  MCV 87.9 87.1 87.0  PLT 449* 395 406*   Cardiac Enzymes: No results for input(s): CKTOTAL, CKMB, CKMBINDEX, TROPONINI in the last 168 hours. BNP: Invalid input(s): POCBNP CBG: No results for input(s): GLUCAP in the last 168  hours. D-Dimer No results for input(s): DDIMER in the last 72 hours. Hgb A1c Recent Labs    08/27/19 1707  HGBA1C 4.9   Lipid Profile No results for input(s): CHOL, HDL, LDLCALC, TRIG, CHOLHDL, LDLDIRECT in the last 72 hours. Thyroid function studies No results for input(s): TSH, T4TOTAL, T3FREE, THYROIDAB in the last 72 hours.  Invalid input(s): FREET3 Anemia work up No results for input(s): VITAMINB12, FOLATE, FERRITIN, TIBC, IRON, RETICCTPCT in the last 72 hours. Microbiology Recent Results (from the past 240 hour(s))  Respiratory Panel by RT PCR (Flu A&B, Covid) - Nasopharyngeal Swab     Status: None   Collection Time: 08/27/19  9:21 AM   Specimen: Nasopharyngeal Swab  Result Value Ref Range Status   SARS Coronavirus 2 by RT PCR NEGATIVE NEGATIVE Final    Comment: (NOTE) SARS-CoV-2 target nucleic acids are NOT DETECTED. The SARS-CoV-2 RNA is generally detectable in upper respiratoy specimens during the acute phase of  infection. The lowest concentration of SARS-CoV-2 viral copies this assay can detect is 131 copies/mL. A negative result does not preclude SARS-Cov-2 infection and should not be used as the sole basis for treatment or other patient management decisions. A negative result may occur with  improper specimen collection/handling, submission of specimen other than nasopharyngeal swab, presence of viral mutation(s) within the areas targeted by this assay, and inadequate number of viral copies (<131 copies/mL). A negative result must be combined with clinical observations, patient history, and epidemiological information. The expected result is Negative. Fact Sheet for Patients:  PinkCheek.be Fact Sheet for Healthcare Providers:  GravelBags.it This test is not yet ap proved or cleared by the [place] FDA and  has been authorized for detection and/or diagnosis of SARS-CoV-2 by FDA under an Emergency Use  Authorization (EUA). This EUA will remain  in effect (meaning this test can be used) for the duration of the COVID-19 declaration under Section 564(b)(1) of the Act, 21 U.S.C. section 360bbb-3(b)(1), unless the authorization is terminated or revoked sooner.    Influenza A by PCR NEGATIVE NEGATIVE Final   Influenza B by PCR NEGATIVE NEGATIVE Final    Comment: (NOTE) The Xpert Xpress SARS-CoV-2/FLU/RSV assay is intended as an aid in  the diagnosis of influenza from Nasopharyngeal swab specimens and  should not be used as a sole basis for treatment. Nasal washings and  aspirates are unacceptable for Xpert Xpress SARS-CoV-2/FLU/RSV  testing. Fact Sheet for Patients: PinkCheek.be Fact Sheet for Healthcare Providers: GravelBags.it This test is not yet approved or cleared by the [place] FDA and  has been authorized for detection and/or diagnosis of SARS-CoV-2 by  FDA under an Emergency Use Authorization (EUA). This EUA will remain  in effect (meaning this test can be used) for the duration of the  Covid-19 declaration under Section 564(b)(1) of the Act, 21  U.S.C. section 360bbb-3(b)(1), unless the authorization is  terminated or revoked. Performed at [facility], [place] [address], [place], [place] [address]      Discharge Instructions:   Discharge Instructions    Diet - low sodium heart healthy   Complete by: As directed    Discharge instructions   Complete by: As directed    Please follow-up with your primary care physician in 1 week.  Continue taking iron as outpatient..  If you notice increasing bleeding, please seek medical attention.  Do not take your Eliquis until 08/31/2019.  Do not take over-the-counter pain medication including Motrin Aleve.   Increase activity slowly   Complete by: As directed      Allergies as of 08/28/2019      Reactions   Clarithromycin Swelling   Penicillins Swelling   Tongue  swelling Did it involve swelling of the face/tongue/throat, SOB, or low BP? Yes Did it involve sudden or severe rash/hives, skin peeling, or any reaction on the inside of your mouth or nose? Yes Did you need to seek medical attention at a hospital or doctor's office? Yes When did it last happen?childhood If all above answers are "NO", may proceed with cephalosporin use.   Pneumococcal Vaccines Other (See Comments)   Couldn't breathe   Sulfonamide Derivatives Swelling   Estradiol Rash   Cream gives her a rash      Medication List    TAKE these medications   albuterol 108 (90 Base) MCG/ACT inhaler Commonly known as: VENTOLIN HFA Inhale 1-2 puffs into the lungs every 6 (six) hours as needed for wheezing or shortness  of breath.   albuterol (2.5 MG/3ML) 0.083% nebulizer solution Commonly known as: PROVENTIL Take 3 mLs (2.5 mg total) by nebulization every 6 (six) hours as needed for wheezing or shortness of breath.   apixaban 5 MG Tabs tablet Commonly known as: Eliquis Take 1 tablet (5 mg total) by mouth 2 (two) times daily. Start on 08/31/19 Start taking on: August 31, 2019 What changed:   additional instructions  These instructions start on August 31, 2019. If you are unsure what to do until then, ask your doctor or other care provider.   aspirin 81 MG EC tablet Take 1 tablet (81 mg total) by mouth daily.   ferrous sulfate 325 (65 FE) MG tablet Take 325 mg by mouth daily with breakfast.   Fluticasone-Salmeterol 250-50 MCG/DOSE Aepb Commonly known as: ADVAIR INHALE 1 DOSE BY MOUTH TWICE DAILY RINSE  MOUTH  AFTER  USE What changed: See the new instructions.   LORazepam 1 MG tablet Commonly known as: ATIVAN Take 0.5-1 mg by mouth at bedtime. Additional 1 mg if needed   nitrofurantoin (macrocrystal-monohydrate) 100 MG capsule Commonly known as: MACROBID Take 100 mg by mouth daily.   omeprazole 10 MG capsule Commonly known as: PRILOSEC Take 10 mg by mouth daily.    potassium chloride 8 MEQ tablet Commonly known as: KLOR-CON Take 8 mEq by mouth daily.   Spiriva Respimat 2.5 MCG/ACT Aers Generic drug: Tiotropium Bromide Monohydrate Inhale 1 puff into the lungs daily with lunch.   traZODone 100 MG tablet Commonly known as: DESYREL Take 100 mg by mouth at bedtime.   triamterene-hydrochlorothiazide 37.5-25 MG tablet Commonly known as: MAXZIDE-25 Take 0.5 tablets by mouth daily.      Follow-up Information    Deland Pretty, MD. Schedule an appointment as soon as possible for a visit in 1 week(s).   Specialty: Internal Medicine Why: Check your blood work at that time. Contact information: [address] [place] [address] [phone]            Time coordinating discharge: 39 minutes  Signed:  Shanequia Kendrick  [facility] 08/28/2019, 2:49 PM

## 2019-09-01 ENCOUNTER — Encounter: Payer: Self-pay | Admitting: Hematology

## 2019-09-01 DIAGNOSIS — J441 Chronic obstructive pulmonary disease with (acute) exacerbation: Secondary | ICD-10-CM | POA: Diagnosis not present

## 2019-09-01 DIAGNOSIS — Z86711 Personal history of pulmonary embolism: Secondary | ICD-10-CM | POA: Diagnosis not present

## 2019-09-01 DIAGNOSIS — D509 Iron deficiency anemia, unspecified: Secondary | ICD-10-CM | POA: Diagnosis not present

## 2019-09-01 DIAGNOSIS — D649 Anemia, unspecified: Secondary | ICD-10-CM | POA: Diagnosis not present

## 2019-09-01 DIAGNOSIS — N390 Urinary tract infection, site not specified: Secondary | ICD-10-CM | POA: Diagnosis not present

## 2019-09-01 DIAGNOSIS — K922 Gastrointestinal hemorrhage, unspecified: Secondary | ICD-10-CM | POA: Diagnosis not present

## 2019-09-02 ENCOUNTER — Telehealth: Payer: Self-pay

## 2019-09-02 NOTE — Telephone Encounter (Signed)
Pt called wanting to know if she can d/c Eliquis after a recent GI bleed. Per discharge notes and confirming with PA's in office, pt is to remain on Eliquis. She mentioned she has an upcoming trip to [place]. Pt verbalized understanding and will call back if she has further questions/concerns.

## 2019-09-03 ENCOUNTER — Telehealth: Payer: Self-pay | Admitting: *Deleted

## 2019-09-03 NOTE — Telephone Encounter (Signed)
Patient called. She wants to know if Dr. Irene Limbo would reduce her blood thinner by half - she is traveling to [place], leaving Saturday. NOTE: was in hospital last week for lower gi bleed (diverticulum was suspected as cause per patient). Was directed to hold Eliquis (5 mg BID) from d/c date 3/31 to 4/3 and resume 4/4. She has now restarted it. Dr. Irene Limbo informed. Dr. Grier Mitts response: patient was discharged with instructions for taking Eliquis. She should follow those instructions at this time. Given history of PE, educate/remind about compression socks, moving every hour and increasing hydration when traveling. Contacted patient with information from Dr. Irene Limbo. Patient verbalized understanding.

## 2019-09-08 DIAGNOSIS — Z20822 Contact with and (suspected) exposure to covid-19: Secondary | ICD-10-CM | POA: Diagnosis not present

## 2019-09-26 DIAGNOSIS — J449 Chronic obstructive pulmonary disease, unspecified: Secondary | ICD-10-CM | POA: Diagnosis not present

## 2019-10-09 DIAGNOSIS — Z86711 Personal history of pulmonary embolism: Secondary | ICD-10-CM | POA: Diagnosis not present

## 2019-10-09 DIAGNOSIS — K219 Gastro-esophageal reflux disease without esophagitis: Secondary | ICD-10-CM | POA: Diagnosis not present

## 2019-10-09 DIAGNOSIS — Z7901 Long term (current) use of anticoagulants: Secondary | ICD-10-CM | POA: Diagnosis not present

## 2019-10-09 DIAGNOSIS — I10 Essential (primary) hypertension: Secondary | ICD-10-CM | POA: Diagnosis not present

## 2019-10-09 DIAGNOSIS — D500 Iron deficiency anemia secondary to blood loss (chronic): Secondary | ICD-10-CM | POA: Diagnosis not present

## 2019-10-09 DIAGNOSIS — E559 Vitamin D deficiency, unspecified: Secondary | ICD-10-CM | POA: Diagnosis not present

## 2019-10-09 DIAGNOSIS — E7800 Pure hypercholesterolemia, unspecified: Secondary | ICD-10-CM | POA: Diagnosis not present

## 2019-10-15 ENCOUNTER — Encounter: Payer: Self-pay | Admitting: Vascular Surgery

## 2019-10-17 ENCOUNTER — Other Ambulatory Visit: Payer: Self-pay

## 2019-10-17 ENCOUNTER — Ambulatory Visit (INDEPENDENT_AMBULATORY_CARE_PROVIDER_SITE_OTHER): Payer: PPO | Admitting: Vascular Surgery

## 2019-10-17 ENCOUNTER — Ambulatory Visit (HOSPITAL_COMMUNITY)
Admission: RE | Admit: 2019-10-17 | Discharge: 2019-10-17 | Disposition: A | Discharge status: Home / Self Care | Payer: PPO | Source: Ambulatory Visit | Attending: Vascular Surgery | Admitting: Vascular Surgery

## 2019-10-17 ENCOUNTER — Encounter: Payer: Self-pay | Admitting: Vascular Surgery

## 2019-10-17 VITALS — BP 136/90 | HR 83 | Temp 97.3°F | Resp 14 | Ht 66.0 in | Wt 165.0 lb

## 2019-10-17 DIAGNOSIS — I871 Compression of vein: Secondary | ICD-10-CM | POA: Diagnosis not present

## 2019-10-17 DIAGNOSIS — I82402 Acute embolism and thrombosis of unspecified deep veins of left lower extremity: Secondary | ICD-10-CM | POA: Insufficient documentation

## 2019-10-17 DIAGNOSIS — Z95828 Presence of other vascular implants and grafts: Secondary | ICD-10-CM | POA: Insufficient documentation

## 2019-10-17 NOTE — Progress Notes (Signed)
Patient ID: [name], female   DOB: January 06, 1941, 79 y.o.   MRN: [id number]  Reason for Consult: Follow-up (DVT)   Referred by Deland Pretty, MD  Subjective:     HPI:  [name] is a 79 y.o. female history of left lower extremity DVT with retroperitoneal hematoma.  She had a filter placed this was subsequently removed.  She then went stenting of the left common external leg veins.  Swelling is now significantly improved.  She had reflux studies performed prior to today's visit.  She remains on Eliquis recently went to [place] he tolerated this well.  Legs doing well does have some spider veins at the ankle.  Past Medical History:  Diagnosis Date  . Allergic rhinitis   . Anemia due to GI blood loss   . Asthma   . Chronic airway obstruction, not elsewhere classified   . Depressive disorder, not elsewhere classified   . DVT (deep venous thrombosis) (HCC)    LLE DVT 01/13/19  . Esophageal reflux   . Essential hypertension 08/27/2019  . PE (pulmonary thromboembolism) ([place]) 01/29/2019  . Pneumonia   . Wears partial dentures    Family History  Problem Relation Age of Onset  . Diabetes Mother   . Coronary artery disease Mother   . Emphysema Father   . Coronary artery disease Brother    Past Surgical History:  Procedure Laterality Date  . ABDOMINAL HYSTERECTOMY    . ABDOMINAL HYSTERECTOMY    . CATARACT EXTRACTION W/ INTRAOCULAR LENS  IMPLANT, BILATERAL    . COLONOSCOPY    . COLONOSCOPY WITH PROPOFOL N/A 08/20/2019   Procedure: COLONOSCOPY WITH PROPOFOL;  Surgeon: Ronald Lobo, MD;  Location: [facility];  Service: Endoscopy;  Laterality: N/A;  . CYSTOSCOPY    . DILATION AND CURETTAGE OF UTERUS    . ESOPHAGOGASTRODUODENOSCOPY (EGD) WITH PROPOFOL N/A 08/20/2019   Procedure: ESOPHAGOGASTRODUODENOSCOPY (EGD) WITH PROPOFOL;  Surgeon: Ronald Lobo, MD;  Location: [facility];  Service: Endoscopy;  Laterality: N/A;  . HEMOSTASIS CONTROL  08/20/2019   Procedure: HEMOSTASIS  CONTROL;  Surgeon: Ronald Lobo, MD;  Location: [facility] ENDOSCOPY;  Service: Endoscopy;;  . INTRAVASCULAR ULTRASOUND/IVUS Left 05/26/2019   Procedure: Intravascular Ultrasound/IVUS;  Surgeon: Waynetta Sandy, MD;  Location: [facility];  Service: Cardiovascular;  Laterality: Left;  . IVC FILTER REMOVAL N/A 03/24/2019   Procedure: IVC FILTER REMOVAL;  Surgeon: Waynetta Sandy, MD;  Location: [place] [facility];  Service: Cardiovascular;  Laterality: N/A;  . LOWER EXTREMITY VENOGRAPHY Left 05/26/2019   Procedure: LOWER EXTREMITY VENOGRAPHY;  Surgeon: Waynetta Sandy, MD;  Location: [facility];  Service: Cardiovascular;  Laterality: Left;  Marland Kitchen MULTIPLE TOOTH EXTRACTIONS    . NASAL SINUS SURGERY    . PERIPHERAL VASCULAR INTERVENTION Left 05/26/2019   Procedure: PERIPHERAL VASCULAR INTERVENTION;  Surgeon: Waynetta Sandy, MD;  Location: [facility];  Service: Cardiovascular;  Laterality: Left;  lower extremity veins  . PERIPHERAL VASCULAR THROMBECTOMY  05/26/2019   Procedure: PERIPHERAL VASCULAR THROMBECTOMY;  Surgeon: Waynetta Sandy, MD;  Location: [facility];  Service: Cardiovascular;;  . TUBAL LIGATION    . VENA CAVA FILTER PLACEMENT N/A 01/13/2019   Procedure: INSERTION VENA-CAVA FILTER;  Surgeon: Waynetta Sandy, MD;  Location: [facility];  Service: Vascular;  Laterality: N/A;    Short Social History:  Social History   Tobacco Use  . Smoking status: Former Smoker    Packs/day: 0.25    Years:  40.00    Pack years: 10.00    Types: Cigarettes    Quit date: 05/27/2013    Years since quitting: 6.3  . Smokeless tobacco: Never Used  Substance Use Topics  . Alcohol use: No    Allergies  Allergen Reactions  . Pneumococcal Vaccines Other (See Comments) and Shortness Of Breath    Couldn't breathe Couldn't breathe Couldn't breathe  . Clarithromycin Swelling  . Estradiol Rash    Cream gives her a rash Cream  gives her a rash Cream gives her a rash  . Sulfa Antibiotics Nausea And Vomiting and Swelling  . Penicillins Swelling    Tongue swelling Did it involve swelling of the face/tongue/throat, SOB, or low BP? Yes Did it involve sudden or severe rash/hives, skin peeling, or any reaction on the inside of your mouth or nose? Yes Did you need to seek medical attention at a hospital or doctor's office? Yes When did it last happen?childhood If all above answers are "NO", may proceed with cephalosporin use.   . Sulfonamide Derivatives Swelling    Current Outpatient Medications  Medication Sig Dispense Refill  . albuterol (PROVENTIL) (2.5 MG/3ML) 0.083% nebulizer solution Take 3 mLs (2.5 mg total) by nebulization every 6 (six) hours as needed for wheezing or shortness of breath. 75 mL 12  . albuterol (VENTOLIN HFA) 108 (90 Base) MCG/ACT inhaler Inhale 1-2 puffs into the lungs every 6 (six) hours as needed for wheezing or shortness of breath.    Marland Kitchen apixaban (ELIQUIS) 5 MG TABS tablet Take 1 tablet (5 mg total) by mouth 2 (two) times daily. Start on 08/31/19 60 tablet   . aspirin EC 81 MG EC tablet Take 1 tablet (81 mg total) by mouth daily. 30 tablet 0  . ferrous sulfate 325 (65 FE) MG tablet Take 325 mg by mouth daily with breakfast.    . Fluticasone-Salmeterol (ADVAIR) 250-50 MCG/DOSE AEPB INHALE 1 DOSE BY MOUTH TWICE DAILY RINSE  MOUTH  AFTER  USE (Patient taking differently: Inhale 1 puff into the lungs in the morning and at bedtime. , RINSE MOUTH AFTER USE) 60 each 5  . LORazepam (ATIVAN) 1 MG tablet Take 0.5-1 mg by mouth at bedtime. Additional 1 mg if needed    . nitrofurantoin, macrocrystal-monohydrate, (MACROBID) 100 MG capsule Take 100 mg by mouth daily.    Marland Kitchen omeprazole (PRILOSEC) 10 MG capsule Take 10 mg by mouth daily.     . potassium chloride (KLOR-CON) 8 MEQ tablet Take 8 mEq by mouth daily.    . Tiotropium Bromide Monohydrate (SPIRIVA RESPIMAT) 2.5 MCG/ACT AERS Inhale 1 puff into the lungs  daily with lunch.     . traZODone (DESYREL) 100 MG tablet Take 100 mg by mouth at bedtime.     . triamterene-hydrochlorothiazide (MAXZIDE-25) 37.5-25 MG tablet Take 0.5 tablets by mouth daily.     No current facility-administered medications for this visit.    Review of Systems  Constitutional:  Constitutional negative. HENT: HENT negative.  Eyes: Eyes negative.  Cardiovascular: Cardiovascular negative.  GI: Gastrointestinal negative.  Musculoskeletal: Musculoskeletal negative.  Skin: Skin negative.  Neurological: Neurological negative. Hematologic: Positive for bruises/bleeds easily.  Psychiatric: Psychiatric negative.        Objective:  Objective   Vitals:   10/17/19 1128  BP: 136/90  Pulse: 83  Resp: 14  Temp: (!) 97.3 F (36.3 C)  TempSrc: Temporal  SpO2: 92%  Weight: 165 lb (74.8 kg)  Height: 5\' 6"  (1.676 m)   Body mass index is  26.63 kg/m.  Physical Exam HENT:     Head: Normocephalic.  Eyes:     Pupils: Pupils are equal, round, and reactive to light.  Cardiovascular:     Rate and Rhythm: Normal rate.     Pulses: Normal pulses.  Pulmonary:     Effort: Pulmonary effort is normal.  Abdominal:     General: Abdomen is flat.     Palpations: Abdomen is soft. There is no mass.  Musculoskeletal:        General: Normal range of motion.  Skin:    Capillary Refill: Capillary refill takes less than 2 seconds.  Neurological:     General: No focal deficit present.     Mental Status: She is alert.  Psychiatric:        Mood and Affect: Mood normal.        Behavior: Behavior normal.        Thought Content: Thought content normal.        Judgment: Judgment normal.     Data: I have independently turbid her left lower extremity venous reflux study which demonstrates reflux in the deep system at the saphenofemoral junction but no reflux in the greater saphenous vein.  There is chronic thrombus in the femoral and popliteal veins.     Assessment/Plan:      79 year old female status post above-noted procedures.  I think it should be safe to stop Eliquis on July 1 and she will continue aspirin indefinitely at this time.  She will follow-up with repeat IVC iliac duplex in 6 months.     Waynetta Sandy MD [facility]

## 2019-10-20 ENCOUNTER — Other Ambulatory Visit: Payer: Self-pay | Admitting: *Deleted

## 2019-10-20 DIAGNOSIS — I871 Compression of vein: Secondary | ICD-10-CM

## 2019-10-26 DIAGNOSIS — J449 Chronic obstructive pulmonary disease, unspecified: Secondary | ICD-10-CM | POA: Diagnosis not present

## 2019-10-30 DIAGNOSIS — E611 Iron deficiency: Secondary | ICD-10-CM | POA: Diagnosis not present

## 2019-11-22 ENCOUNTER — Other Ambulatory Visit: Payer: Self-pay

## 2019-11-22 ENCOUNTER — Emergency Department (HOSPITAL_BASED_OUTPATIENT_CLINIC_OR_DEPARTMENT_OTHER): Payer: PPO

## 2019-11-22 ENCOUNTER — Emergency Department (HOSPITAL_BASED_OUTPATIENT_CLINIC_OR_DEPARTMENT_OTHER)
Admission: EM | Admit: 2019-11-22 | Discharge: 2019-11-22 | Disposition: A | Discharge status: Home / Self Care | Payer: PPO | Attending: Emergency Medicine | Admitting: Emergency Medicine

## 2019-11-22 ENCOUNTER — Encounter (HOSPITAL_BASED_OUTPATIENT_CLINIC_OR_DEPARTMENT_OTHER): Payer: Self-pay | Admitting: Emergency Medicine

## 2019-11-22 DIAGNOSIS — Z87891 Personal history of nicotine dependence: Secondary | ICD-10-CM | POA: Diagnosis not present

## 2019-11-22 DIAGNOSIS — Z7982 Long term (current) use of aspirin: Secondary | ICD-10-CM | POA: Diagnosis not present

## 2019-11-22 DIAGNOSIS — Z880 Allergy status to penicillin: Secondary | ICD-10-CM | POA: Insufficient documentation

## 2019-11-22 DIAGNOSIS — K828 Other specified diseases of gallbladder: Secondary | ICD-10-CM | POA: Diagnosis not present

## 2019-11-22 DIAGNOSIS — R110 Nausea: Secondary | ICD-10-CM | POA: Insufficient documentation

## 2019-11-22 DIAGNOSIS — I10 Essential (primary) hypertension: Secondary | ICD-10-CM | POA: Insufficient documentation

## 2019-11-22 DIAGNOSIS — K6389 Other specified diseases of intestine: Secondary | ICD-10-CM | POA: Diagnosis not present

## 2019-11-22 DIAGNOSIS — K5730 Diverticulosis of large intestine without perforation or abscess without bleeding: Secondary | ICD-10-CM | POA: Diagnosis not present

## 2019-11-22 DIAGNOSIS — N3289 Other specified disorders of bladder: Secondary | ICD-10-CM | POA: Diagnosis not present

## 2019-11-22 DIAGNOSIS — J45909 Unspecified asthma, uncomplicated: Secondary | ICD-10-CM | POA: Diagnosis not present

## 2019-11-22 DIAGNOSIS — R1030 Lower abdominal pain, unspecified: Secondary | ICD-10-CM | POA: Diagnosis not present

## 2019-11-22 LAB — URINALYSIS, MICROSCOPIC (REFLEX)

## 2019-11-22 LAB — COMPREHENSIVE METABOLIC PANEL
ALT: 23 U/L (ref 0–44)
AST: 25 U/L (ref 15–41)
Albumin: 4.2 g/dL (ref 3.5–5.0)
Alkaline Phosphatase: 57 U/L (ref 38–126)
Anion gap: 12 (ref 5–15)
BUN: 15 mg/dL (ref 8–23)
CO2: 26 mmol/L (ref 22–32)
Calcium: 8.8 mg/dL — ABNORMAL LOW (ref 8.9–10.3)
Chloride: 98 mmol/L (ref 98–111)
Creatinine, Ser: 0.94 mg/dL (ref 0.44–1.00)
GFR calc Af Amer: >60 mL/min (ref >60)
GFR calc non Af Amer: 59 mL/min — ABNORMAL LOW (ref >60)
Glucose, Bld: 133 mg/dL — ABNORMAL HIGH (ref 70–99)
Potassium: 3.4 mmol/L — ABNORMAL LOW (ref 3.5–5.1)
Sodium: 136 mmol/L (ref 135–145)
Total Bilirubin: 1.1 mg/dL (ref 0.3–1.2)
Total Protein: 7.1 g/dL (ref 6.5–8.1)

## 2019-11-22 LAB — CBC WITH DIFFERENTIAL/PLATELET
Abs Immature Granulocytes: 0.05 10*3/uL (ref 0.00–0.07)
Basophils Absolute: 0.1 10*3/uL (ref 0.0–0.1)
Basophils Relative: 1 %
Eosinophils Absolute: 0 10*3/uL (ref 0.0–0.5)
Eosinophils Relative: 0 %
HCT: 50.3 % — ABNORMAL HIGH (ref 36.0–46.0)
Hemoglobin: 16 g/dL — ABNORMAL HIGH (ref 12.0–15.0)
Immature Granulocytes: 1 %
Lymphocytes Relative: 14 %
Lymphs Abs: 1.4 10*3/uL (ref 0.7–4.0)
MCH: 28.6 pg (ref 26.0–34.0)
MCHC: 31.8 g/dL (ref 30.0–36.0)
MCV: 89.8 fL (ref 80.0–100.0)
Monocytes Absolute: 1.3 10*3/uL — ABNORMAL HIGH (ref 0.1–1.0)
Monocytes Relative: 13 %
Neutro Abs: 7.2 10*3/uL (ref 1.7–7.7)
Neutrophils Relative %: 71 %
Platelets: 280 10*3/uL (ref 150–400)
RBC: 5.6 MIL/uL — ABNORMAL HIGH (ref 3.87–5.11)
RDW: 15 % (ref 11.5–15.5)
WBC: 10 10*3/uL (ref 4.0–10.5)
nRBC: 0 % (ref 0.0–0.2)

## 2019-11-22 LAB — URINALYSIS, ROUTINE W REFLEX MICROSCOPIC
Glucose, UA: NEGATIVE mg/dL
Ketones, ur: NEGATIVE mg/dL
Nitrite: NEGATIVE
Protein, ur: 30 mg/dL — AB
Specific Gravity, Urine: >1.03 — ABNORMAL HIGH (ref 1.005–1.030)
pH: 5.5 (ref 5.0–8.0)

## 2019-11-22 LAB — LIPASE, BLOOD: Lipase: 23 U/L (ref 11–51)

## 2019-11-22 MED ORDER — IOHEXOL 300 MG/ML  SOLN
100.0000 mL | Freq: Once | INTRAMUSCULAR | Status: AC | PRN
  Administered 2019-11-22: 100 mL via INTRAVENOUS

## 2019-11-22 MED ORDER — ONDANSETRON HCL 4 MG/2ML IJ SOLN
4.0000 mg | Freq: Once | INTRAMUSCULAR | Status: AC
  Administered 2019-11-22: 4 mg via INTRAVENOUS
  Filled 2019-11-22: qty 2

## 2019-11-22 MED ORDER — CIPROFLOXACIN HCL 500 MG PO TABS: 500.0000 mg | ORAL_TABLET | Freq: Two times a day (BID) | ORAL | 0 refills | Status: AC

## 2019-11-22 MED ORDER — CIPROFLOXACIN HCL 500 MG PO TABS: 500.0000 mg | ORAL_TABLET | Freq: Two times a day (BID) | ORAL | 0 refills | Status: DC

## 2019-11-22 MED ORDER — FENTANYL CITRATE (PF) 100 MCG/2ML IJ SOLN
50.0000 ug | Freq: Once | INTRAMUSCULAR | Status: AC
  Administered 2019-11-22: 50 ug via INTRAVENOUS
  Filled 2019-11-22: qty 2

## 2019-11-22 MED ORDER — METRONIDAZOLE 500 MG PO TABS: 500.0000 mg | ORAL_TABLET | Freq: Two times a day (BID) | ORAL | 0 refills | Status: AC

## 2019-11-22 MED ORDER — ONDANSETRON HCL 4 MG PO TABS: 4.0000 mg | ORAL_TABLET | Freq: Three times a day (TID) | ORAL | 0 refills | Status: DC | PRN

## 2019-11-22 NOTE — ED Triage Notes (Signed)
Pt c/o lower abd and back pain. Pt oxygen sat 89% on RA, pt is on home oxygen but does not wear it.

## 2019-11-22 NOTE — Discharge Instructions (Signed)
Follow-up with primary care doctor as discussed.  Discussed if you would need another colonoscopy.

## 2019-11-22 NOTE — ED Provider Notes (Signed)
TIME SEEN: 5:52 AM  CHIEF COMPLAINT: Lower abdominal pain, left flank pain  HPI: Patient is a 79 year old female with history of PE and DVT on Eliquis, hypertension who presents to the emergency department with complaints of lower abdominal pain pain for the past 2 to 3 days.  She denies fevers, chills.  Has had nausea.  No vomiting.  Did have diarrhea yesterday that resolved with Imodium.  No dysuria, hematuria, vaginal bleeding or discharge.  She is status post appendectomy and BTL.  No history of kidney stones or kidney infection.  Of note, patient was found to be hypoxic here on arrival.  She is supposed to wear oxygen at home but she does not do it chronically.  She denies any fevers, cough, chest pain, shortness of breath.  She reports compliance with Eliquis and has had 2 COVID-19 vaccinations.  ROS: See HPI Constitutional: no fever  Eyes: no drainage  ENT: no runny nose   Cardiovascular:  no chest pain  Resp: no SOB  GI: no vomiting GU: no dysuria Integumentary: no rash  Allergy: no hives  Musculoskeletal: no leg swelling  Neurological: no slurred speech ROS otherwise negative  PAST MEDICAL HISTORY/PAST SURGICAL HISTORY:  Past Medical History:  Diagnosis Date  . Allergic rhinitis   . Anemia due to GI blood loss   . Asthma   . Chronic airway obstruction, not elsewhere classified   . Depressive disorder, not elsewhere classified   . DVT (deep venous thrombosis) (HCC)    LLE DVT 01/13/19  . Esophageal reflux   . Essential hypertension 08/27/2019  . PE (pulmonary thromboembolism) ([place]) 01/29/2019  . Pneumonia   . Wears partial dentures     MEDICATIONS:  Prior to Admission medications   Medication Sig Start Date End Date Taking? Authorizing Provider  albuterol (PROVENTIL) (2.5 MG/3ML) 0.083% nebulizer solution Take 3 mLs (2.5 mg total) by nebulization every 6 (six) hours as needed for wheezing or shortness of breath. 06/30/19   Deneise Lever, MD  albuterol (VENTOLIN HFA)  108 (90 Base) MCG/ACT inhaler Inhale 1-2 puffs into the lungs every 6 (six) hours as needed for wheezing or shortness of breath.    [provider]  apixaban (ELIQUIS) 5 MG TABS tablet Take 1 tablet (5 mg total) by mouth 2 (two) times daily. Start on 08/31/19 08/31/19   Pokhrel, Corrie Mckusick, MD  aspirin EC 81 MG EC tablet Take 1 tablet (81 mg total) by mouth daily. 01/17/19   Bonnielee Haff, MD  ferrous sulfate 325 (65 FE) MG tablet Take 325 mg by mouth daily with breakfast.    [provider]  Fluticasone-Salmeterol (ADVAIR) 250-50 MCG/DOSE AEPB INHALE 1 DOSE BY MOUTH TWICE DAILY RINSE  MOUTH  AFTER  USE Patient taking differently: Inhale 1 puff into the lungs in the morning and at bedtime. , RINSE MOUTH AFTER USE 06/13/19   Baird Lyons D, MD  LORazepam (ATIVAN) 1 MG tablet Take 0.5-1 mg by mouth at bedtime. Additional 1 mg if needed    [provider]  nitrofurantoin, macrocrystal-monohydrate, (MACROBID) 100 MG capsule Take 100 mg by mouth daily. 08/18/19   [provider]  omeprazole (PRILOSEC) 10 MG capsule Take 10 mg by mouth daily.  09/13/11   [provider]  potassium chloride (KLOR-CON) 8 MEQ tablet Take 8 mEq by mouth daily. 02/07/19   [provider]  Tiotropium Bromide Monohydrate (SPIRIVA RESPIMAT) 2.5 MCG/ACT AERS Inhale 1 puff into the lungs daily with lunch.     [provider]  traZODone (DESYREL) 100 MG tablet Take 100 mg by mouth at bedtime.     [provider]  triamterene-hydrochlorothiazide (MAXZIDE-25) 37.5-25 MG tablet Take 0.5 tablets by mouth daily. 02/06/19   [provider]    ALLERGIES:  Allergies  Allergen Reactions  . Pneumococcal Vaccines Other (See Comments) and Shortness Of Breath    Couldn't breathe Couldn't breathe Couldn't breathe  . Clarithromycin Swelling  . Estradiol Rash    Cream gives her a rash Cream gives her a rash Cream gives her a rash  . Sulfa Antibiotics Nausea And  Vomiting and Swelling  . Penicillins Swelling    Tongue swelling Did it involve swelling of the face/tongue/throat, SOB, or low BP? Yes Did it involve sudden or severe rash/hives, skin peeling, or any reaction on the inside of your mouth or nose? Yes Did you need to seek medical attention at a hospital or doctor's office? Yes When did it last happen?childhood If all above answers are "NO", may proceed with cephalosporin use.   . Sulfonamide Derivatives Swelling    SOCIAL HISTORY:  Social History   Tobacco Use  . Smoking status: Former Smoker    Packs/day: 0.25    Years: 40.00    Pack years: 10.00    Types: Cigarettes    Quit date: 05/27/2013    Years since quitting: 6.4  . Smokeless tobacco: Never Used  Substance Use Topics  . Alcohol use: No    FAMILY HISTORY: Family History  Problem Relation Age of Onset  . Diabetes Mother   . Coronary artery disease Mother   . Emphysema Father   . Coronary artery disease Brother     EXAM: BP (!) 143/101 (BP Location: Right Arm)   Pulse 100   Temp 97.6 F (36.4 C) (Oral)   Resp 20   Ht 5\' 6"  (1.676 m)   Wt 73.5 kg   SpO2 (!) 89%   BMI 26.15 kg/m  CONSTITUTIONAL: Alert and oriented and responds appropriately to questions. Well-appearing; well-nourished HEAD: Normocephalic EYES: Conjunctivae clear, pupils appear equal, EOM appear intact ENT: normal nose; moist mucous membranes NECK: Supple, normal ROM CARD: RRR; S1 and S2 appreciated; no murmurs, no clicks, no rubs, no gallops RESP: Normal chest excursion without splinting or tachypnea; breath sounds clear and equal bilaterally; no wheezes, no rhonchi, no rales, no hypoxia or respiratory distress, speaking full sentences ABD/GI: Normal bowel sounds; non-distended; soft, mildly tender throughout the lower abdomen, no rebound, no guarding, no peritoneal signs, no hepatosplenomegaly BACK:  The back appears normal; patient does have some left CVA tenderness on exam, skin appears  normal without redness, warmth, soft tissue swelling or ecchymosis, no midline spinal tenderness or step-off or deformity EXT: Normal ROM in all joints; no deformity noted, no edema; no cyanosis SKIN: Normal color for age and race; warm; no rash on exposed skin NEURO: Moves all extremities equally, normal gait, normal speech, no facial asymmetry PSYCH: The patient's mood and manner are appropriate.   MEDICAL DECISION MAKING: Patient here with lower abdominal pain and flank pain.  Differential includes UTI, pyelonephritis, kidney stone, diverticulitis, colitis.  Will obtain labs, urine and give pain medication.  Also obtain CT of abdomen pelvis.  She was found to have slightly low oxygen saturation here but states she has physical oxygen chronically walked in with no oxygen on.  Her lungs are clear to auscultation and she denies any chest pain or shortness of breath.  She does have history of PE  but reports compliance with Eliquis.  She has been vaccinated for COVID-19.  We will continue to monitor her oxygen levels while on oxygen.  ED PROGRESS: Patient's labs are unremarkable.  Urine shows small leukocytes and many bacteria but also many squamous cells.  She is not having urinary symptoms.  Likely a dirty collection.  CT of the abdomen pelvis pending.  Signed out to oncoming EDP to follow-up on CT and dispo appropriately.  I reviewed all nursing notes and pertinent previous records as available.  I have reviewed and interpreted any EKGs, lab and urine results, imaging (as available).     TANVEER DOBBERSTEIN was evaluated in Emergency Department on 11/22/2019 for the symptoms described in the history of present illness. She was evaluated in the context of the global COVID-19 pandemic, which necessitated consideration that the patient might be at risk for infection with the SARS-CoV-2 virus that causes COVID-19. Institutional protocols and algorithms that pertain to the evaluation of patients at risk for  COVID-19 are in a state of rapid change based on information released by regulatory bodies including the CDC and federal and state organizations. These policies and algorithms were followed during the patient's care in the ED.      Jaskirat Zertuche, Delice Bison, DO 11/22/19 [phone]

## 2019-11-22 NOTE — ED Provider Notes (Signed)
Patient signed out to me at 7 AM.  Several days of abdominal pain, diarrhea.  Lab work unremarkable.  CT scan pending.  CT scan shows possibly some infectious versus inflammatory changes.  Patient did have recent colonoscopy 2 months ago that she states was normal.  We will treat with antibiotics in case infectious process.  Overall nonspecific findings and no major acute findings on CT abdomen pelvis.  Patient feels improved upon reevaluation.  Will provide Zofran for nausea.  We will have her discuss the need for possible colonoscopy again with her primary care doctor/GI but seems unlikely that there is a neoplastic process given normal colonoscopy 2 months ago.  Understands return precautions and discharged from ED in good condition.  Understands return precautions.  This chart was dictated using voice recognition software.  Despite best efforts to proofread,  errors can occur which can change the documentation meaning.     Lennice Sites, DO 11/22/19 [phone]

## 2019-11-26 DIAGNOSIS — J449 Chronic obstructive pulmonary disease, unspecified: Secondary | ICD-10-CM | POA: Diagnosis not present

## 2019-12-04 DIAGNOSIS — K5520 Angiodysplasia of colon without hemorrhage: Secondary | ICD-10-CM | POA: Diagnosis not present

## 2019-12-04 DIAGNOSIS — D500 Iron deficiency anemia secondary to blood loss (chronic): Secondary | ICD-10-CM | POA: Diagnosis not present

## 2019-12-04 DIAGNOSIS — R933 Abnormal findings on diagnostic imaging of other parts of digestive tract: Secondary | ICD-10-CM | POA: Diagnosis not present

## 2019-12-10 DIAGNOSIS — N390 Urinary tract infection, site not specified: Secondary | ICD-10-CM | POA: Diagnosis not present

## 2019-12-26 DIAGNOSIS — J449 Chronic obstructive pulmonary disease, unspecified: Secondary | ICD-10-CM | POA: Diagnosis not present

## 2019-12-29 ENCOUNTER — Ambulatory Visit: Payer: PPO | Admitting: Internal Medicine

## 2020-01-05 ENCOUNTER — Telehealth: Payer: Self-pay | Admitting: Hematology

## 2020-01-05 NOTE — Telephone Encounter (Signed)
Cancelled per 8/9 sch message. Pt stated she will call office back to reschedule when pt is ready.

## 2020-01-26 DIAGNOSIS — J449 Chronic obstructive pulmonary disease, unspecified: Secondary | ICD-10-CM | POA: Diagnosis not present

## 2020-01-28 ENCOUNTER — Ambulatory Visit: Payer: PPO | Admitting: Hematology

## 2020-01-28 ENCOUNTER — Other Ambulatory Visit: Payer: PPO

## 2020-02-26 DIAGNOSIS — J449 Chronic obstructive pulmonary disease, unspecified: Secondary | ICD-10-CM | POA: Diagnosis not present

## 2020-03-05 DIAGNOSIS — Z23 Encounter for immunization: Secondary | ICD-10-CM | POA: Diagnosis not present

## 2020-03-10 ENCOUNTER — Other Ambulatory Visit: Payer: Self-pay | Admitting: *Deleted

## 2020-03-10 DIAGNOSIS — I82402 Acute embolism and thrombosis of unspecified deep veins of left lower extremity: Secondary | ICD-10-CM

## 2020-03-27 DIAGNOSIS — J449 Chronic obstructive pulmonary disease, unspecified: Secondary | ICD-10-CM | POA: Diagnosis not present

## 2020-03-29 ENCOUNTER — Ambulatory Visit (INDEPENDENT_AMBULATORY_CARE_PROVIDER_SITE_OTHER): Payer: PPO

## 2020-03-29 ENCOUNTER — Other Ambulatory Visit: Payer: Self-pay

## 2020-03-29 ENCOUNTER — Encounter: Payer: Self-pay | Admitting: Internal Medicine

## 2020-03-29 ENCOUNTER — Ambulatory Visit: Payer: PPO | Admitting: Internal Medicine

## 2020-03-29 VITALS — BP 128/80 | HR 81 | Temp 97.9°F | Ht 66.0 in | Wt 168.8 lb

## 2020-03-29 DIAGNOSIS — I2609 Other pulmonary embolism with acute cor pulmonale: Secondary | ICD-10-CM

## 2020-03-29 DIAGNOSIS — I10 Essential (primary) hypertension: Secondary | ICD-10-CM | POA: Diagnosis not present

## 2020-03-29 DIAGNOSIS — J441 Chronic obstructive pulmonary disease with (acute) exacerbation: Secondary | ICD-10-CM | POA: Diagnosis not present

## 2020-03-29 DIAGNOSIS — J449 Chronic obstructive pulmonary disease, unspecified: Secondary | ICD-10-CM

## 2020-03-29 DIAGNOSIS — J439 Emphysema, unspecified: Secondary | ICD-10-CM | POA: Diagnosis not present

## 2020-03-29 MED ORDER — METHYLPREDNISOLONE ACETATE 80 MG/ML IJ SUSP
80.0000 mg | Freq: Once | INTRAMUSCULAR | Status: AC
  Administered 2020-03-29: 80 mg via INTRAMUSCULAR

## 2020-03-29 NOTE — Patient Instructions (Signed)
Order- CXR   Dx COPD mixed type  Order- Depo 65    Dx COPD mixed type  Ok to continue current meds   Please call if we can help

## 2020-03-29 NOTE — Progress Notes (Signed)
Patient ID: [name], female    DOB: 1941/04/15, 79 y.o.   MRN: [id number]  HPI female former smoker followed for COPD, allergic rhinitis, complicated by GERD, chronic back pain Office Spirometry 12/16/13- Severe obstructive airways disease, FVC 2.13/ 65%, FEV1 1.08/ 44%, FEV1/FVC 0.51, FEF25-75% 0.47/ 22%. History of intense reaction shortly after receiving pneumonia vaccine, leading to hospitalization. She is considered "allergic" and will not take pneumonia vaccine Office Spirometry 06/22/2016-severe obstructive airways disease. FVC 2.44/78%, FEV1 1.04/44%, ratio 0.43, FEF 25-75% 0.39/21%  ----------------------------------------------------------------   06/30/19- 79 year old female former smoker followed for COPD, allergic rhinitis, complicated by GERD, chronic back pain, PE 01/28/2019/  DVT(L)/ Eliquis,   [facility] Dec 28 for thrombectomy L DVT. Neb albuterol, albuterol hfa, Advair 250, Spiriva 2.5 Respimat,  Had difficult Fall with PE, retroperitoneal bleed, IVC filter placed then removed, extensive DVT L leg/ thrombectomy. Now on Eliquis.  Increased wheeze x 2 weeks but denies sore throat, fever, purulent sputum. Asks for depo shot instead of prednisone- feels overwhelmed by number of pills she is taking now.  Has O2 from hospital stay, but trying to avoid using it to avoid dependence- education done. CTa chest 01/28/2019-  IMPRESSION: Extensive acute pulmonary emboli involving the lobar and segmental branches of all pulmonary lobes. No main branch or saddle embolism. There is CT evidence of right heart strain.  34/92- 79 year old female former smoker followed for COPD, allergic rhinitis, complicated by GERD, chronic back pain, PE 01/28/2019/  DVT(L)/ Eliquis, Hosp April- lower GI bleed ED in June for abdominal pain/ diarrhea Neb albuterol, albuterol hfa, Advair 250, Spiriva 2.5 Respimat,  Covid vax- 2 Phizer Flu vax- had -----pt states wheezing and sob on exertion Notes recent  wheezing and dyspnea on exertion. Wants "big shot"- steroid. Little sputum, no CP.  Says Advair is ok but asks about a stronger rescue inhaler.  Had PE last year, then had GIB on Eliquis. Hgb in June was 16.  ROS-see HPI    + = positive Constitutional:   No-   weight loss, night sweats, fevers, chills, fatigue, lassitude. HEENT:   No-  headaches, difficulty swallowing, tooth/dental problems, sore throat,       No- sneezing, itching, ear ache,nasal congestion, +post nasal drip,  CV:  No-   chest pain, orthopnea, PND, swelling in lower extremities, anasarca, dizziness, palpitations Resp: + shortness of breath with exertion or at rest.              No-   productive cough,  + non-productive cough,  No- coughing up of blood.              No-   change in color of mucus.  +occasional mild wheezing.   Skin: No-   rash or lesions. GI:  No-   heartburn, indigestion, abdominal pain, nausea, vomiting,  GU:  MS:  No-   joint pain or swelling. + Acute/chronic back pain Neuro-     nothing unusual Psych:  No- change in mood or affect. No depression or anxiety.  No memory loss.  OBJ- Physical Exam General- Alert, Oriented, Affect-appropriate, Distress- none acute Skin- rash-none, lesions- none, excoriation- none Lymphadenopathy- none Head- atraumatic            Eyes- Gross vision intact, PERRLA, conjunctivae and secretions clear            Ears- Hearing, canals-normal            Nose- Clear, no-Septal dev, mucus, polyps, erosion, perforation  Throat- Mallampati II , mucosa clear , drainage- none, tonsils- atrophic Neck- flexible , trachea midline, no stridor , thyroid nl, carotid no bruit Chest - symmetrical excursion , unlabored           Heart/CV- RRR , no murmur , no gallop  , no rub, nl s1 s2                           - JVD- none , edema- none, stasis changes- none, varices- none           Lung-  +distant, , cough- none, wheeze-none , dullness-none, rub- none           Chest wall-   Abd-  Br/ Gen/ Rectal- Not done, not indicated Extrem- cyanosis- none, clubbing, none, atrophy- none, strength- nl Neuro- +slight droop L eye.

## 2020-04-08 DIAGNOSIS — D649 Anemia, unspecified: Secondary | ICD-10-CM | POA: Diagnosis not present

## 2020-04-08 DIAGNOSIS — Z0000 Encounter for general adult medical examination without abnormal findings: Secondary | ICD-10-CM | POA: Diagnosis not present

## 2020-04-08 DIAGNOSIS — E559 Vitamin D deficiency, unspecified: Secondary | ICD-10-CM | POA: Diagnosis not present

## 2020-04-08 DIAGNOSIS — E7800 Pure hypercholesterolemia, unspecified: Secondary | ICD-10-CM | POA: Diagnosis not present

## 2020-04-13 ENCOUNTER — Encounter: Payer: Self-pay | Admitting: Internal Medicine

## 2020-04-13 NOTE — Assessment & Plan Note (Signed)
GIB while on Eliquis. Discussed. Being managed by her other physicians.

## 2020-04-13 NOTE — Assessment & Plan Note (Signed)
Nonspecific increase in DOE. Reports wheeze, not evident at this exam. Suspect weather/ season change, not infection Plan- depomedrol inj, reviewed meds

## 2020-04-16 ENCOUNTER — Encounter: Payer: Self-pay | Admitting: Vascular Surgery

## 2020-04-16 ENCOUNTER — Ambulatory Visit: Payer: PPO | Admitting: Vascular Surgery

## 2020-04-16 ENCOUNTER — Other Ambulatory Visit: Payer: Self-pay

## 2020-04-16 ENCOUNTER — Ambulatory Visit (HOSPITAL_COMMUNITY)
Admission: RE | Admit: 2020-04-16 | Discharge: 2020-04-16 | Disposition: A | Discharge status: Home / Self Care | Payer: PPO | Source: Ambulatory Visit | Attending: Vascular Surgery | Admitting: Vascular Surgery

## 2020-04-16 VITALS — BP 129/85 | HR 75 | Temp 97.4°F | Resp 20 | Ht 66.0 in | Wt 166.0 lb

## 2020-04-16 DIAGNOSIS — I871 Compression of vein: Secondary | ICD-10-CM | POA: Insufficient documentation

## 2020-04-16 DIAGNOSIS — I82402 Acute embolism and thrombosis of unspecified deep veins of left lower extremity: Secondary | ICD-10-CM | POA: Diagnosis not present

## 2020-04-16 NOTE — Progress Notes (Signed)
Patient ID: [name], female   DOB: 1940-10-01, 79 y.o.   MRN: [id number]  Reason for Consult: Follow-up   Referred by Deland Pretty, MD  Subjective:     HPI:  [name] is a 79 y.o. female has a history of a left lower extremity extensive DVT at the same time with concomitant retroperitoneal hematoma and IVC filter was placed.  IVC filter was subsequently removed.  She then underwent stenting of her left common and external iliac veins.  She remained on Eliquis then subsequently DC this after several months.  She is now on aspirin lifelong.  She does demonstrate some swelling in her thigh overall her leg feels normal she works out with normal activity.   Past Medical History:  Diagnosis Date  . Allergic rhinitis   . Anemia due to GI blood loss   . Asthma   . Chronic airway obstruction, not elsewhere classified   . Depressive disorder, not elsewhere classified   . DVT (deep venous thrombosis) (HCC)    LLE DVT 01/13/19  . Esophageal reflux   . Essential hypertension 08/27/2019  . PE (pulmonary thromboembolism) ([place]) 01/29/2019  . Pneumonia   . Wears partial dentures    Family History  Problem Relation Age of Onset  . Diabetes Mother   . Coronary artery disease Mother   . Emphysema Father   . Coronary artery disease Brother    Past Surgical History:  Procedure Laterality Date  . ABDOMINAL HYSTERECTOMY    . ABDOMINAL HYSTERECTOMY    . CATARACT EXTRACTION W/ INTRAOCULAR LENS  IMPLANT, BILATERAL    . COLONOSCOPY    . COLONOSCOPY WITH PROPOFOL N/A 08/20/2019   Procedure: COLONOSCOPY WITH PROPOFOL;  Surgeon: Ronald Lobo, MD;  Location: [facility];  Service: Endoscopy;  Laterality: N/A;  . CYSTOSCOPY    . DILATION AND CURETTAGE OF UTERUS    . ESOPHAGOGASTRODUODENOSCOPY (EGD) WITH PROPOFOL N/A 08/20/2019   Procedure: ESOPHAGOGASTRODUODENOSCOPY (EGD) WITH PROPOFOL;  Surgeon: Ronald Lobo, MD;  Location: [facility];  Service: Endoscopy;  Laterality: N/A;  .  HEMOSTASIS CONTROL  08/20/2019   Procedure: HEMOSTASIS CONTROL;  Surgeon: Ronald Lobo, MD;  Location: [facility] ENDOSCOPY;  Service: Endoscopy;;  . INTRAVASCULAR ULTRASOUND/IVUS Left 05/26/2019   Procedure: Intravascular Ultrasound/IVUS;  Surgeon: Waynetta Sandy, MD;  Location: [place] CV LAB;  Service: Cardiovascular;  Laterality: Left;  . IVC FILTER REMOVAL N/A 03/24/2019   Procedure: IVC FILTER REMOVAL;  Surgeon: Waynetta Sandy, MD;  Location: [place] CV LAB;  Service: Cardiovascular;  Laterality: N/A;  . LOWER EXTREMITY VENOGRAPHY Left 05/26/2019   Procedure: LOWER EXTREMITY VENOGRAPHY;  Surgeon: Waynetta Sandy, MD;  Location: [place] CV LAB;  Service: Cardiovascular;  Laterality: Left;  Marland Kitchen MULTIPLE TOOTH EXTRACTIONS    . NASAL SINUS SURGERY    . PERIPHERAL VASCULAR INTERVENTION Left 05/26/2019   Procedure: PERIPHERAL VASCULAR INTERVENTION;  Surgeon: Waynetta Sandy, MD;  Location: [place] [facility];  Service: Cardiovascular;  Laterality: Left;  lower extremity veins  . PERIPHERAL VASCULAR THROMBECTOMY  05/26/2019   Procedure: PERIPHERAL VASCULAR THROMBECTOMY;  Surgeon: Waynetta Sandy, MD;  Location: [place] CV [facility];  Service: Cardiovascular;;  . TUBAL LIGATION    . VENA CAVA FILTER PLACEMENT N/A 01/13/2019   Procedure: INSERTION VENA-CAVA FILTER;  Surgeon: Waynetta Sandy, MD;  Location: [place];  Service: Vascular;  Laterality: N/A;    Short Social History:  Social History   Tobacco Use  . Smoking status: Former Smoker  Packs/day: 0.25    Years: 40.00    Pack years: 10.00    Types: Cigarettes    Quit date: 05/27/2013    Years since quitting: 6.8  . Smokeless tobacco: Never Used  Substance Use Topics  . Alcohol use: No    Allergies  Allergen Reactions  . Pneumococcal Vaccines Other (See Comments) and Shortness Of Breath    Couldn't breathe Couldn't breathe Couldn't breathe  . Clarithromycin  Swelling  . Estradiol Rash    Cream gives her a rash Cream gives her a rash Cream gives her a rash  . Sulfa Antibiotics Nausea And Vomiting and Swelling  . Penicillins Swelling    Tongue swelling Did it involve swelling of the face/tongue/throat, SOB, or low BP? Yes Did it involve sudden or severe rash/hives, skin peeling, or any reaction on the inside of your mouth or nose? Yes Did you need to seek medical attention at a hospital or doctor's office? Yes When did it last happen?childhood If all above answers are "NO", may proceed with cephalosporin use.   . Sulfonamide Derivatives Swelling    Current Outpatient Medications  Medication Sig Dispense Refill  . albuterol (PROVENTIL) (2.5 MG/3ML) 0.083% nebulizer solution Take 3 mLs (2.5 mg total) by nebulization every 6 (six) hours as needed for wheezing or shortness of breath. 75 mL 12  . albuterol (VENTOLIN HFA) 108 (90 Base) MCG/ACT inhaler Inhale 1-2 puffs into the lungs every 6 (six) hours as needed for wheezing or shortness of breath.    Marland Kitchen apixaban (ELIQUIS) 5 MG TABS tablet Take 1 tablet (5 mg total) by mouth 2 (two) times daily. Start on 08/31/19 60 tablet   . aspirin EC 81 MG EC tablet Take 1 tablet (81 mg total) by mouth daily. 30 tablet 0  . ferrous sulfate 325 (65 FE) MG tablet Take 325 mg by mouth daily with breakfast.    . Fluticasone-Salmeterol (ADVAIR) 250-50 MCG/DOSE AEPB INHALE 1 DOSE BY MOUTH TWICE DAILY RINSE  MOUTH  AFTER  USE (Patient taking differently: Inhale 1 puff into the lungs in the morning and at bedtime. , RINSE MOUTH AFTER USE) 60 each 5  . LORazepam (ATIVAN) 1 MG tablet Take 0.5-1 mg by mouth at bedtime. Additional 1 mg if needed    . nitrofurantoin, macrocrystal-monohydrate, (MACROBID) 100 MG capsule Take 100 mg by mouth daily.    Marland Kitchen omeprazole (PRILOSEC) 10 MG capsule Take 10 mg by mouth daily.     . ondansetron (ZOFRAN) 4 MG tablet Take 1 tablet (4 mg total) by mouth every 8 (eight) hours as needed for up to  15 doses for nausea or vomiting. 15 tablet 0  . potassium chloride (KLOR-CON) 8 MEQ tablet Take 8 mEq by mouth daily.    . Tiotropium Bromide Monohydrate (SPIRIVA RESPIMAT) 2.5 MCG/ACT AERS Inhale 1 puff into the lungs daily with lunch.     . traZODone (DESYREL) 100 MG tablet Take 100 mg by mouth at bedtime.     . triamterene-hydrochlorothiazide (MAXZIDE-25) 37.5-25 MG tablet Take 0.5 tablets by mouth daily.     No current facility-administered medications for this visit.    Review of Systems  Constitutional:  Constitutional negative. HENT: HENT negative.  Eyes: Eyes negative.  Respiratory: Respiratory negative.  Cardiovascular: Positive for leg swelling.  GI: Gastrointestinal negative.  Musculoskeletal: Musculoskeletal negative.  Skin: Skin negative.  Neurological: Neurological negative. Hematologic: Hematologic/lymphatic negative.  Psychiatric: Psychiatric negative.        Objective:  Objective   Vitals:  04/16/20 0939  BP: 129/85  Pulse: 75  Resp: 20  Temp: (!) 97.4 F (36.3 C)  SpO2: 93%  Weight: 166 lb (75.3 kg)  Height: 5\' 6"  (1.676 m)   Body mass index is 26.79 kg/m.  Physical Exam HENT:     Head: Normocephalic.     Nose:     Comments: Wearing a mask Eyes:     Pupils: Pupils are equal, round, and reactive to light.  Cardiovascular:     Rate and Rhythm: Regular rhythm.  Pulmonary:     Effort: Pulmonary effort is normal.  Abdominal:     General: Abdomen is flat.     Palpations: Abdomen is soft.  Musculoskeletal:        General: No swelling.     Right lower leg: No edema.     Left lower leg: No edema.  Skin:    General: Skin is warm.     Capillary Refill: Capillary refill takes less than 2 seconds.  Neurological:     General: No focal deficit present.     Mental Status: She is alert.  Psychiatric:        Mood and Affect: Mood normal.        Behavior: Behavior normal.        Thought Content: Thought content normal.        Judgment: Judgment  normal.     Data: IVC/Iliac Findings:  +----------+------+--------+--------+    IVC  PatentThrombusComments  +----------+------+--------+--------+  IVC Prox patent          +----------+------+--------+--------+  IVC Mid  patent          +----------+------+--------+--------+  IVC Distalpatent          +----------+------+--------+--------+     +-------------------+---------+-----------+---------+-----------+--------+      CIV    RT-PatentRT-ThrombusLT-PatentLT-ThrombusComments  +-------------------+---------+-----------+---------+-----------+--------+  Common Iliac Prox  patent        patent        stent   +-------------------+---------+-----------+---------+-----------+--------+  Common Iliac Mid   patent        patent        stent   +-------------------+---------+-----------+---------+-----------+--------+  Common Iliac Distal patent        patent        stent   +-------------------+---------+-----------+---------+-----------+--------+   Iliac stents identified and patent bilaterally.   +-------------------------+---------+-----------+---------+-----------+----  ----+        EIV       RT-PatentRT-ThrombusLT-PatentLT-ThrombusComments  +-------------------------+---------+-----------+---------+-----------+----  ----+  External Iliac Vein Prox  patent        patent         stent   +-------------------------+---------+-----------+---------+-----------+----  ----+  External Iliac Vein Mid  patent        patent         stent   +-------------------------+---------+-----------+---------+-----------+----  ----+  External Iliac Vein    patent        patent         stent   Distal                                         +-------------------------+---------+-----------+---------+-----------+----  ----+     Summary:  IVC/Iliac: No evidence of thrombus in IVC and Iliac veins. There is no  evidence of thrombus involving the right common iliac vein and left common  iliac vein. There is no evidence of thrombus involving the right external  iliac vein and left external  iliac  vein.      Assessment/Plan:     79 year old female status post the above-noted procedures.  She is doing very well and her stents are patent today.  She will continue aspirin daily.  Follow-up in 1 year with repeat IVC iliac duplex.  Patient has requested to see me personally at that time.     Waynetta Sandy MD [facility]

## 2020-04-26 DIAGNOSIS — B373 Candidiasis of vulva and vagina: Secondary | ICD-10-CM | POA: Diagnosis not present

## 2020-04-26 DIAGNOSIS — D509 Iron deficiency anemia, unspecified: Secondary | ICD-10-CM | POA: Diagnosis not present

## 2020-04-26 DIAGNOSIS — E559 Vitamin D deficiency, unspecified: Secondary | ICD-10-CM | POA: Diagnosis not present

## 2020-04-26 DIAGNOSIS — E7800 Pure hypercholesterolemia, unspecified: Secondary | ICD-10-CM | POA: Diagnosis not present

## 2020-04-26 DIAGNOSIS — K219 Gastro-esophageal reflux disease without esophagitis: Secondary | ICD-10-CM | POA: Diagnosis not present

## 2020-04-26 DIAGNOSIS — E875 Hyperkalemia: Secondary | ICD-10-CM | POA: Diagnosis not present

## 2020-04-26 DIAGNOSIS — Z86711 Personal history of pulmonary embolism: Secondary | ICD-10-CM | POA: Diagnosis not present

## 2020-04-26 DIAGNOSIS — Z7901 Long term (current) use of anticoagulants: Secondary | ICD-10-CM | POA: Diagnosis not present

## 2020-04-26 DIAGNOSIS — J441 Chronic obstructive pulmonary disease with (acute) exacerbation: Secondary | ICD-10-CM | POA: Diagnosis not present

## 2020-04-26 DIAGNOSIS — Z0000 Encounter for general adult medical examination without abnormal findings: Secondary | ICD-10-CM | POA: Diagnosis not present

## 2020-04-27 DIAGNOSIS — J449 Chronic obstructive pulmonary disease, unspecified: Secondary | ICD-10-CM | POA: Diagnosis not present

## 2020-05-27 DIAGNOSIS — J449 Chronic obstructive pulmonary disease, unspecified: Secondary | ICD-10-CM | POA: Diagnosis not present

## 2020-05-31 ENCOUNTER — Telehealth: Payer: Self-pay | Admitting: Internal Medicine

## 2020-05-31 DIAGNOSIS — M79644 Pain in right finger(s): Secondary | ICD-10-CM | POA: Diagnosis not present

## 2020-05-31 NOTE — Telephone Encounter (Signed)
Called and spoke with pt who stated she is going to be having a procedure done on trigger finger. Surgical clearance form is going to be sent to office by Dr. Eulah Pont.  Routing to [place] as well as Dr. Maple Hudson as an FYI so that way they can be on lookout for form.

## 2020-06-01 NOTE — Telephone Encounter (Signed)
We have received surgical clearance form and I have placed it in Dr. Sinclair Ship sign folder

## 2020-06-08 ENCOUNTER — Telehealth: Payer: Self-pay

## 2020-06-08 NOTE — Telephone Encounter (Signed)
Mandi, do you know if this was completed yet? Thanks.

## 2020-06-08 NOTE — Telephone Encounter (Addendum)
Patient called c/o pain in left back, buttock and groin. Wonders if it is related to a muscle problem or vascular issue/stent. She is s/p venous stents in 2020. She denies any swelling, redness, or difficulty breathing outside of known COPD issues. Patient has previous diagnosis of sciatic/vertebral compression fractures. Advised patient it did not sound vascular in nature, she may try heat/tylenol and give PCP a call. Verbalized understanding.   Patient called back requesting urgent appt with [facility]. No new symptoms from this morning. Says she is "not happy with the way I am feeling and he needs to take a look." Gave patient appt on 1/21 with [facility] for evaluation. Patient states, "reckon I'll be dead by then." Advised patient to call back if further problems develop.

## 2020-06-08 NOTE — Telephone Encounter (Signed)
Yes it was filled out and faxed over to the office.Confirmation received.  Nothing further needed at this time.

## 2020-06-09 ENCOUNTER — Telehealth: Payer: Self-pay | Admitting: Internal Medicine

## 2020-06-09 NOTE — Telephone Encounter (Signed)
Patient reports of wheezing and increased sob with exertion x1 week.  Denied fever, chills, sweats, cough, congestion or additional sx.  She has had both covid vaccines and flu shot. Using albuterol solution 2-3x daily, advair BID and Spiriva once daily with temporary relief in sx. She would like an appt with [name] to get a shot of depo, as this has helped with similar symptoms in the past.  She is okay with waiting until 06/11/2019 for a response.   [name], please advise if okay for patient to come in for OV with current sx? Thank you.  Current Outpatient Medications on File Prior to Visit  Medication Sig Dispense Refill  . albuterol (PROVENTIL) (2.5 MG/3ML) 0.083% nebulizer solution Take 3 mLs (2.5 mg total) by nebulization every 6 (six) hours as needed for wheezing or shortness of breath. 75 mL 12  . albuterol (VENTOLIN HFA) 108 (90 Base) MCG/ACT inhaler Inhale 1-2 puffs into the lungs every 6 (six) hours as needed for wheezing or shortness of breath.    Marland Kitchen apixaban (ELIQUIS) 5 MG TABS tablet Take 1 tablet (5 mg total) by mouth 2 (two) times daily. Start on 08/31/19 60 tablet   . aspirin EC 81 MG EC tablet Take 1 tablet (81 mg total) by mouth daily. 30 tablet 0  . ferrous sulfate 325 (65 FE) MG tablet Take 325 mg by mouth daily with breakfast.    . Fluticasone-Salmeterol (ADVAIR) 250-50 MCG/DOSE AEPB INHALE 1 DOSE BY MOUTH TWICE DAILY RINSE  MOUTH  AFTER  USE (Patient taking differently: Inhale 1 puff into the lungs in the morning and at bedtime. , RINSE MOUTH AFTER USE) 60 each 5  . LORazepam (ATIVAN) 1 MG tablet Take 0.5-1 mg by mouth at bedtime. Additional 1 mg if needed    . nitrofurantoin, macrocrystal-monohydrate, (MACROBID) 100 MG capsule Take 100 mg by mouth daily.    Marland Kitchen omeprazole (PRILOSEC) 10 MG capsule Take 10 mg by mouth daily.     . ondansetron (ZOFRAN) 4 MG tablet Take 1 tablet (4 mg total) by mouth every 8 (eight) hours as needed for up to 15 doses for nausea or vomiting. 15  tablet 0  . potassium chloride (KLOR-CON) 8 MEQ tablet Take 8 mEq by mouth daily.    . Tiotropium Bromide Monohydrate (SPIRIVA RESPIMAT) 2.5 MCG/ACT AERS Inhale 1 puff into the lungs daily with lunch.     . traZODone (DESYREL) 100 MG tablet Take 100 mg by mouth at bedtime.     . triamterene-hydrochlorothiazide (MAXZIDE-25) 37.5-25 MG tablet Take 0.5 tablets by mouth daily.     No current facility-administered medications on file prior to visit.    Allergies  Allergen Reactions  . Pneumococcal Vaccines Other (See Comments) and Shortness Of Breath    Couldn't breathe Couldn't breathe Couldn't breathe  . Clarithromycin Swelling  . Estradiol Rash    Cream gives her a rash Cream gives her a rash Cream gives her a rash  . Sulfa Antibiotics Nausea And Vomiting and Swelling  . Penicillins Swelling    Tongue swelling Did it involve swelling of the face/tongue/throat, SOB, or low BP? Yes Did it involve sudden or severe rash/hives, skin peeling, or any reaction on the inside of your mouth or nose? Yes Did you need to seek medical attention at a hospital or doctor's office? Yes When did it last happen?childhood If all above answers are "NO", may proceed with cephalosporin use.   . Sulfonamide Derivatives Swelling

## 2020-06-10 ENCOUNTER — Emergency Department (HOSPITAL_COMMUNITY): Payer: PPO

## 2020-06-10 ENCOUNTER — Encounter (HOSPITAL_COMMUNITY): Payer: Self-pay | Admitting: Emergency Medicine

## 2020-06-10 ENCOUNTER — Inpatient Hospital Stay (HOSPITAL_COMMUNITY)
Admission: EM | Admit: 2020-06-10 | Discharge: 2020-06-12 | DRG: 177 | Disposition: A | Discharge status: Home / Self Care | Payer: PPO | Source: Ambulatory Visit | Attending: Internal Medicine | Admitting: Internal Medicine

## 2020-06-10 DIAGNOSIS — I10 Essential (primary) hypertension: Secondary | ICD-10-CM | POA: Diagnosis present

## 2020-06-10 DIAGNOSIS — Z882 Allergy status to sulfonamides status: Secondary | ICD-10-CM

## 2020-06-10 DIAGNOSIS — Z888 Allergy status to other drugs, medicaments and biological substances status: Secondary | ICD-10-CM | POA: Diagnosis not present

## 2020-06-10 DIAGNOSIS — Z887 Allergy status to serum and vaccine status: Secondary | ICD-10-CM | POA: Diagnosis not present

## 2020-06-10 DIAGNOSIS — Z7901 Long term (current) use of anticoagulants: Secondary | ICD-10-CM

## 2020-06-10 DIAGNOSIS — Z8719 Personal history of other diseases of the digestive system: Secondary | ICD-10-CM

## 2020-06-10 DIAGNOSIS — Z7951 Long term (current) use of inhaled steroids: Secondary | ICD-10-CM | POA: Diagnosis not present

## 2020-06-10 DIAGNOSIS — J9601 Acute respiratory failure with hypoxia: Secondary | ICD-10-CM | POA: Diagnosis present

## 2020-06-10 DIAGNOSIS — D509 Iron deficiency anemia, unspecified: Secondary | ICD-10-CM | POA: Diagnosis not present

## 2020-06-10 DIAGNOSIS — K219 Gastro-esophageal reflux disease without esophagitis: Secondary | ICD-10-CM | POA: Diagnosis present

## 2020-06-10 DIAGNOSIS — J9600 Acute respiratory failure, unspecified whether with hypoxia or hypercapnia: Secondary | ICD-10-CM | POA: Diagnosis not present

## 2020-06-10 DIAGNOSIS — Z9981 Dependence on supplemental oxygen: Secondary | ICD-10-CM | POA: Diagnosis not present

## 2020-06-10 DIAGNOSIS — Z880 Allergy status to penicillin: Secondary | ICD-10-CM

## 2020-06-10 DIAGNOSIS — F419 Anxiety disorder, unspecified: Secondary | ICD-10-CM | POA: Diagnosis present

## 2020-06-10 DIAGNOSIS — J189 Pneumonia, unspecified organism: Secondary | ICD-10-CM

## 2020-06-10 DIAGNOSIS — U071 COVID-19: Secondary | ICD-10-CM | POA: Diagnosis present

## 2020-06-10 DIAGNOSIS — Z8249 Family history of ischemic heart disease and other diseases of the circulatory system: Secondary | ICD-10-CM | POA: Diagnosis not present

## 2020-06-10 DIAGNOSIS — Z79899 Other long term (current) drug therapy: Secondary | ICD-10-CM | POA: Diagnosis not present

## 2020-06-10 DIAGNOSIS — R0902 Hypoxemia: Secondary | ICD-10-CM | POA: Diagnosis not present

## 2020-06-10 DIAGNOSIS — J449 Chronic obstructive pulmonary disease, unspecified: Secondary | ICD-10-CM | POA: Diagnosis not present

## 2020-06-10 DIAGNOSIS — Z7982 Long term (current) use of aspirin: Secondary | ICD-10-CM

## 2020-06-10 DIAGNOSIS — R062 Wheezing: Secondary | ICD-10-CM | POA: Diagnosis not present

## 2020-06-10 DIAGNOSIS — J1282 Pneumonia due to coronavirus disease 2019: Secondary | ICD-10-CM | POA: Diagnosis present

## 2020-06-10 DIAGNOSIS — Z87891 Personal history of nicotine dependence: Secondary | ICD-10-CM | POA: Diagnosis not present

## 2020-06-10 DIAGNOSIS — Z825 Family history of asthma and other chronic lower respiratory diseases: Secondary | ICD-10-CM | POA: Diagnosis not present

## 2020-06-10 DIAGNOSIS — J440 Chronic obstructive pulmonary disease with acute lower respiratory infection: Secondary | ICD-10-CM | POA: Diagnosis present

## 2020-06-10 DIAGNOSIS — F32A Depression, unspecified: Secondary | ICD-10-CM | POA: Diagnosis present

## 2020-06-10 DIAGNOSIS — E785 Hyperlipidemia, unspecified: Secondary | ICD-10-CM | POA: Diagnosis present

## 2020-06-10 DIAGNOSIS — Z86711 Personal history of pulmonary embolism: Secondary | ICD-10-CM | POA: Diagnosis not present

## 2020-06-10 DIAGNOSIS — Z86718 Personal history of other venous thrombosis and embolism: Secondary | ICD-10-CM

## 2020-06-10 DIAGNOSIS — Z881 Allergy status to other antibiotic agents status: Secondary | ICD-10-CM | POA: Diagnosis not present

## 2020-06-10 DIAGNOSIS — D500 Iron deficiency anemia secondary to blood loss (chronic): Secondary | ICD-10-CM | POA: Diagnosis present

## 2020-06-10 DIAGNOSIS — R0602 Shortness of breath: Secondary | ICD-10-CM | POA: Diagnosis not present

## 2020-06-10 DIAGNOSIS — Z833 Family history of diabetes mellitus: Secondary | ICD-10-CM | POA: Diagnosis not present

## 2020-06-10 DIAGNOSIS — R0600 Dyspnea, unspecified: Secondary | ICD-10-CM | POA: Diagnosis not present

## 2020-06-10 LAB — BASIC METABOLIC PANEL
Anion gap: 13 (ref 5–15)
BUN: 20 mg/dL (ref 8–23)
CO2: 28 mmol/L (ref 22–32)
Calcium: 8.9 mg/dL (ref 8.9–10.3)
Chloride: 98 mmol/L (ref 98–111)
Creatinine, Ser: 1.23 mg/dL — ABNORMAL HIGH (ref 0.44–1.00)
GFR, Estimated: 45 mL/min — ABNORMAL LOW (ref >60)
Glucose, Bld: 97 mg/dL (ref 70–99)
Potassium: 3.1 mmol/L — ABNORMAL LOW (ref 3.5–5.1)
Sodium: 139 mmol/L (ref 135–145)

## 2020-06-10 LAB — CBC
HCT: 49.5 % — ABNORMAL HIGH (ref 36.0–46.0)
Hemoglobin: 15.8 g/dL — ABNORMAL HIGH (ref 12.0–15.0)
MCH: 29.4 pg (ref 26.0–34.0)
MCHC: 31.9 g/dL (ref 30.0–36.0)
MCV: 92.2 fL (ref 80.0–100.0)
Platelets: 418 10*3/uL — ABNORMAL HIGH (ref 150–400)
RBC: 5.37 MIL/uL — ABNORMAL HIGH (ref 3.87–5.11)
RDW: 12.7 % (ref 11.5–15.5)
WBC: 7.1 10*3/uL (ref 4.0–10.5)
nRBC: 0 % (ref 0.0–0.2)

## 2020-06-10 NOTE — Progress Notes (Deleted)
Patient ID: [name], female    DOB: 1940/10/18, 80 y.o.   MRN: [id number]  HPI female former smoker followed for COPD, allergic rhinitis, complicated by GERD, chronic back pain Office Spirometry 12/16/13- Severe obstructive airways disease, FVC 2.13/ 65%, FEV1 1.08/ 44%, FEV1/FVC 0.51, FEF25-75% 0.47/ 22%. History of intense reaction shortly after receiving pneumonia vaccine, leading to hospitalization. She is considered "allergic" and will not take pneumonia vaccine Office Spirometry 06/22/2016-severe obstructive airways disease. FVC 2.44/78%, FEV1 1.04/44%, ratio 0.43, FEF 25-75% 0.39/21%  ----------------------------------------------------------------   16/72- 80 year old female former smoker followed for COPD, allergic rhinitis, complicated by GERD, chronic back pain, PE 01/28/2019/  DVT(L)/ Eliquis, Hosp April- lower GI bleed ED in June for abdominal pain/ diarrhea Neb albuterol, albuterol hfa, Advair 250, Spiriva 2.5 Respimat,  Covid vax- 2 Phizer Flu vax- had -----pt states wheezing and sob on exertion Notes recent wheezing and dyspnea on exertion. Wants "big shot"- steroid. Little sputum, no CP.  Says Advair is ok but asks about a stronger rescue inhaler.  Had PE last year, then had GIB on Eliquis. Hgb in June was 42.  06/11/20-  80 year old female former smoker followed for COPD, allergic rhinitis, complicated by GERD, chronic back pain, PE 01/28/2019/  DVT(L)/ Eliquis, -Neb albuterol, albuterol hfa, Advair 250, Spiriva 2.5 Respimat, ED 1/13- sent by [facility] w dx pneumonia Covid vax- Flu vax-   CXR 06/10/20- COMPARISON:  June 10, 2020 FINDINGS: Again noted are bilateral hazy airspace opacities, greatest in the right lung zone. These appear to have slightly improved from prior study. The heart size is stable. Aortic calcifications are noted. There is no pneumothorax. The lungs appear to be hyperexpanded. There are likely underlying emphysematous changes.  There is no acute osseous abnormality. IMPRESSION: Persistent hazy bilateral airspace opacities, greatest in the right mid lung zone, somewhat improved from recent prior study which may be in part due to technique.   ROS-see HPI    + = positive Constitutional:   No-   weight loss, night sweats, fevers, chills, fatigue, lassitude. HEENT:   No-  headaches, difficulty swallowing, tooth/dental problems, sore throat,       No- sneezing, itching, ear ache,nasal congestion, +post nasal drip,  CV:  No-   chest pain, orthopnea, PND, swelling in lower extremities, anasarca, dizziness, palpitations Resp: + shortness of breath with exertion or at rest.              No-   productive cough,  + non-productive cough,  No- coughing up of blood.              No-   change in color of mucus.  +occasional mild wheezing.   Skin: No-   rash or lesions. GI:  No-   heartburn, indigestion, abdominal pain, nausea, vomiting,  GU:  MS:  No-   joint pain or swelling. + Acute/chronic back pain Neuro-     nothing unusual Psych:  No- change in mood or affect. No depression or anxiety.  No memory loss.  OBJ- Physical Exam General- Alert, Oriented, Affect-appropriate, Distress- none acute Skin- rash-none, lesions- none, excoriation- none Lymphadenopathy- none Head- atraumatic            Eyes- Gross vision intact, PERRLA, conjunctivae and secretions clear            Ears- Hearing, canals-normal            Nose- Clear, no-Septal dev, mucus, polyps, erosion, perforation  Throat- Mallampati II , mucosa clear , drainage- none, tonsils- atrophic Neck- flexible , trachea midline, no stridor , thyroid nl, carotid no bruit Chest - symmetrical excursion , unlabored           Heart/CV- RRR , no murmur , no gallop  , no rub, nl s1 s2                           - JVD- none , edema- none, stasis changes- none, varices- none           Lung-  +distant, , cough- none, wheeze-none , dullness-none, rub- none            Chest wall-  Abd-  Br/ Gen/ Rectal- Not done, not indicated Extrem- cyanosis- none, clubbing, none, atrophy- none, strength- nl Neuro- +slight droop L eye.

## 2020-06-10 NOTE — Telephone Encounter (Signed)
Ok to use a held spot for me or to see an APP If she prefers, we can offer prednisone 20 mg, # 5, 1 daily x 5 days

## 2020-06-10 NOTE — ED Triage Notes (Signed)
Patient sent by [facility] to ED for pneumonia. Patient has not been prescribed any antibiotics. History of respiratory problems, thought she just needed a steroid injection but was told she had pna and was sent to ED by EMS.

## 2020-06-10 NOTE — Telephone Encounter (Signed)
Called spoke with patient before previous message sent back to triage, patient did not mention surgical clearance she said she felt better did not need to come in for appointment and did not want the prednisone called in at this time.  [name] has opening for appt 06/17/20 at 9 am for surgical clearance

## 2020-06-10 NOTE — Telephone Encounter (Signed)
Patient scheduled for surgical clearance on 06/11/2020.

## 2020-06-10 NOTE — Telephone Encounter (Signed)
Patient states feeling better. Does not need appointment with [name]. Dr. Fredonia Highland needs surgical clearance for trigger finger. Patient phone number is [phone].

## 2020-06-11 ENCOUNTER — Ambulatory Visit: Payer: PPO | Admitting: Internal Medicine

## 2020-06-11 DIAGNOSIS — Z7951 Long term (current) use of inhaled steroids: Secondary | ICD-10-CM | POA: Diagnosis not present

## 2020-06-11 DIAGNOSIS — Z833 Family history of diabetes mellitus: Secondary | ICD-10-CM | POA: Diagnosis not present

## 2020-06-11 DIAGNOSIS — Z880 Allergy status to penicillin: Secondary | ICD-10-CM | POA: Diagnosis not present

## 2020-06-11 DIAGNOSIS — J1282 Pneumonia due to coronavirus disease 2019: Secondary | ICD-10-CM | POA: Diagnosis present

## 2020-06-11 DIAGNOSIS — E785 Hyperlipidemia, unspecified: Secondary | ICD-10-CM | POA: Diagnosis present

## 2020-06-11 DIAGNOSIS — J9600 Acute respiratory failure, unspecified whether with hypoxia or hypercapnia: Secondary | ICD-10-CM | POA: Diagnosis present

## 2020-06-11 DIAGNOSIS — J9601 Acute respiratory failure with hypoxia: Secondary | ICD-10-CM | POA: Diagnosis present

## 2020-06-11 DIAGNOSIS — I10 Essential (primary) hypertension: Secondary | ICD-10-CM | POA: Diagnosis present

## 2020-06-11 DIAGNOSIS — Z86711 Personal history of pulmonary embolism: Secondary | ICD-10-CM | POA: Diagnosis not present

## 2020-06-11 DIAGNOSIS — Z9981 Dependence on supplemental oxygen: Secondary | ICD-10-CM | POA: Diagnosis not present

## 2020-06-11 DIAGNOSIS — U071 COVID-19: Principal | ICD-10-CM

## 2020-06-11 DIAGNOSIS — Z7901 Long term (current) use of anticoagulants: Secondary | ICD-10-CM | POA: Diagnosis not present

## 2020-06-11 DIAGNOSIS — F32A Depression, unspecified: Secondary | ICD-10-CM | POA: Diagnosis present

## 2020-06-11 DIAGNOSIS — Z79899 Other long term (current) drug therapy: Secondary | ICD-10-CM | POA: Diagnosis not present

## 2020-06-11 DIAGNOSIS — Z888 Allergy status to other drugs, medicaments and biological substances status: Secondary | ICD-10-CM | POA: Diagnosis not present

## 2020-06-11 DIAGNOSIS — F419 Anxiety disorder, unspecified: Secondary | ICD-10-CM | POA: Diagnosis present

## 2020-06-11 DIAGNOSIS — Z887 Allergy status to serum and vaccine status: Secondary | ICD-10-CM | POA: Diagnosis not present

## 2020-06-11 DIAGNOSIS — Z881 Allergy status to other antibiotic agents status: Secondary | ICD-10-CM | POA: Diagnosis not present

## 2020-06-11 DIAGNOSIS — Z825 Family history of asthma and other chronic lower respiratory diseases: Secondary | ICD-10-CM | POA: Diagnosis not present

## 2020-06-11 DIAGNOSIS — Z86718 Personal history of other venous thrombosis and embolism: Secondary | ICD-10-CM | POA: Diagnosis not present

## 2020-06-11 DIAGNOSIS — Z7982 Long term (current) use of aspirin: Secondary | ICD-10-CM | POA: Diagnosis not present

## 2020-06-11 DIAGNOSIS — Z882 Allergy status to sulfonamides status: Secondary | ICD-10-CM | POA: Diagnosis not present

## 2020-06-11 DIAGNOSIS — Z8249 Family history of ischemic heart disease and other diseases of the circulatory system: Secondary | ICD-10-CM | POA: Diagnosis not present

## 2020-06-11 DIAGNOSIS — Z87891 Personal history of nicotine dependence: Secondary | ICD-10-CM | POA: Diagnosis not present

## 2020-06-11 DIAGNOSIS — J440 Chronic obstructive pulmonary disease with acute lower respiratory infection: Secondary | ICD-10-CM | POA: Diagnosis present

## 2020-06-11 LAB — RESP PANEL BY RT-PCR (FLU A&B, COVID) ARPGX2
Influenza A by PCR: NEGATIVE
Influenza B by PCR: NEGATIVE
SARS Coronavirus 2 by RT PCR: POSITIVE — AB

## 2020-06-11 LAB — FIBRINOGEN: Fibrinogen: 511 mg/dL — ABNORMAL HIGH (ref 210–475)

## 2020-06-11 LAB — C-REACTIVE PROTEIN: CRP: 5.2 mg/dL — ABNORMAL HIGH (ref <1.0)

## 2020-06-11 LAB — FERRITIN: Ferritin: 196 ng/mL (ref 11–307)

## 2020-06-11 LAB — D-DIMER, QUANTITATIVE: D-Dimer, Quant: 1.15 ug/mL-FEU — ABNORMAL HIGH (ref 0.00–0.50)

## 2020-06-11 MED ORDER — SODIUM CHLORIDE 0.9% FLUSH: 3.0000 mL | INTRAVENOUS | Status: DC | PRN

## 2020-06-11 MED ORDER — METHYLPREDNISOLONE SODIUM SUCC 125 MG IJ SOLR
125.0000 mg | Freq: Once | INTRAMUSCULAR | Status: AC
  Administered 2020-06-11: 125 mg via INTRAVENOUS
  Filled 2020-06-11: qty 2

## 2020-06-11 MED ORDER — SODIUM CHLORIDE 0.9 % IV SOLN
200.0000 mg | Freq: Once | INTRAVENOUS | Status: AC
  Administered 2020-06-11: 200 mg via INTRAVENOUS
  Filled 2020-06-11 (×2): qty 40

## 2020-06-11 MED ORDER — FLUTICASONE FUROATE-VILANTEROL 200-25 MCG/INH IN AEPB: 1.0000 | INHALATION_SPRAY | Freq: Every day | RESPIRATORY_TRACT | Status: DC

## 2020-06-11 MED ORDER — DOCUSATE SODIUM 100 MG PO CAPS
100.0000 mg | ORAL_CAPSULE | Freq: Two times a day (BID) | ORAL | Status: DC
  Filled 2020-06-11: qty 1

## 2020-06-11 MED ORDER — POLYETHYLENE GLYCOL 3350 17 G PO PACK: 17.0000 g | PACK | Freq: Every day | ORAL | Status: DC | PRN

## 2020-06-11 MED ORDER — TRIAMTERENE-HCTZ 37.5-25 MG PO TABS
0.5000 | ORAL_TABLET | Freq: Every day | ORAL | Status: DC
  Administered 2020-06-12: 0.5 via ORAL
  Filled 2020-06-11: qty 1

## 2020-06-11 MED ORDER — ONDANSETRON HCL 4 MG PO TABS: 4.0000 mg | ORAL_TABLET | Freq: Four times a day (QID) | ORAL | Status: DC | PRN

## 2020-06-11 MED ORDER — DOXYCYCLINE HYCLATE 100 MG PO TABS
100.0000 mg | ORAL_TABLET | Freq: Once | ORAL | Status: AC
  Administered 2020-06-11: 100 mg via ORAL
  Filled 2020-06-11: qty 1

## 2020-06-11 MED ORDER — PREDNISONE 20 MG PO TABS: 50.0000 mg | ORAL_TABLET | Freq: Every day | ORAL | Status: DC

## 2020-06-11 MED ORDER — SODIUM CHLORIDE 0.9 % IV SOLN: 250.0000 mL | INTRAVENOUS | Status: DC | PRN

## 2020-06-11 MED ORDER — BISACODYL 5 MG PO TBEC: 5.0000 mg | DELAYED_RELEASE_TABLET | Freq: Every day | ORAL | Status: DC | PRN

## 2020-06-11 MED ORDER — ZINC SULFATE 220 (50 ZN) MG PO CAPS
220.0000 mg | ORAL_CAPSULE | Freq: Every day | ORAL | Status: DC
  Administered 2020-06-11 – 2020-06-12 (×2): 220 mg via ORAL
  Filled 2020-06-11 (×2): qty 1

## 2020-06-11 MED ORDER — SODIUM CHLORIDE 0.9% FLUSH
3.0000 mL | Freq: Two times a day (BID) | INTRAVENOUS | Status: DC
  Administered 2020-06-11 – 2020-06-12 (×3): 3 mL via INTRAVENOUS

## 2020-06-11 MED ORDER — TRAZODONE HCL 50 MG PO TABS
100.0000 mg | ORAL_TABLET | Freq: Every day | ORAL | Status: DC
Start: 2020-06-11 — End: 2020-06-12
  Administered 2020-06-11: 100 mg via ORAL
  Filled 2020-06-11: qty 2

## 2020-06-11 MED ORDER — OXYCODONE HCL 5 MG PO TABS: 5.0000 mg | ORAL_TABLET | ORAL | Status: DC | PRN

## 2020-06-11 MED ORDER — SODIUM CHLORIDE 0.9% FLUSH
3.0000 mL | Freq: Two times a day (BID) | INTRAVENOUS | Status: DC
  Administered 2020-06-11 – 2020-06-12 (×2): 3 mL via INTRAVENOUS

## 2020-06-11 MED ORDER — PANTOPRAZOLE SODIUM 40 MG PO TBEC
40.0000 mg | DELAYED_RELEASE_TABLET | Freq: Every day | ORAL | Status: DC
  Administered 2020-06-12: 40 mg via ORAL
  Filled 2020-06-11: qty 1

## 2020-06-11 MED ORDER — SODIUM CHLORIDE 0.9 % IV SOLN
100.0000 mg | Freq: Every day | INTRAVENOUS | Status: DC
  Administered 2020-06-12: 100 mg via INTRAVENOUS
  Filled 2020-06-11: qty 20

## 2020-06-11 MED ORDER — SODIUM CHLORIDE 0.9 % IV SOLN: Freq: Once | INTRAVENOUS | Status: AC

## 2020-06-11 MED ORDER — LORAZEPAM 1 MG PO TABS
1.0000 mg | ORAL_TABLET | Freq: Every day | ORAL | Status: DC
  Administered 2020-06-11: 1 mg via ORAL
  Filled 2020-06-11: qty 1

## 2020-06-11 MED ORDER — HYDROCOD POLST-CPM POLST ER 10-8 MG/5ML PO SUER: 5.0000 mL | Freq: Two times a day (BID) | ORAL | Status: DC | PRN

## 2020-06-11 MED ORDER — GUAIFENESIN-DM 100-10 MG/5ML PO SYRP: 10.0000 mL | ORAL_SOLUTION | ORAL | Status: DC | PRN

## 2020-06-11 MED ORDER — ALBUTEROL SULFATE HFA 108 (90 BASE) MCG/ACT IN AERS: 2.0000 | INHALATION_SPRAY | RESPIRATORY_TRACT | Status: DC | PRN

## 2020-06-11 MED ORDER — ENOXAPARIN SODIUM 40 MG/0.4ML ~~LOC~~ SOLN: 40.0000 mg | SUBCUTANEOUS | Status: DC

## 2020-06-11 MED ORDER — METHYLPREDNISOLONE SODIUM SUCC 40 MG IJ SOLR
40.0000 mg | Freq: Two times a day (BID) | INTRAMUSCULAR | Status: DC
  Administered 2020-06-12: 40 mg via INTRAVENOUS
  Filled 2020-06-11 (×2): qty 1

## 2020-06-11 MED ORDER — SODIUM CHLORIDE 0.9 % IV SOLN
1.0000 g | Freq: Once | INTRAVENOUS | Status: AC
  Administered 2020-06-11: 1 g via INTRAVENOUS
  Filled 2020-06-11: qty 10

## 2020-06-11 MED ORDER — ACETAMINOPHEN 325 MG PO TABS: 650.0000 mg | ORAL_TABLET | Freq: Four times a day (QID) | ORAL | Status: DC | PRN

## 2020-06-11 MED ORDER — ASCORBIC ACID 500 MG PO TABS
500.0000 mg | ORAL_TABLET | Freq: Every day | ORAL | Status: DC
  Administered 2020-06-11 – 2020-06-12 (×2): 500 mg via ORAL
  Filled 2020-06-11 (×2): qty 1

## 2020-06-11 MED ORDER — POTASSIUM CHLORIDE CRYS ER 20 MEQ PO TBCR
40.0000 meq | EXTENDED_RELEASE_TABLET | Freq: Once | ORAL | Status: AC
  Administered 2020-06-11: 40 meq via ORAL
  Filled 2020-06-11: qty 2

## 2020-06-11 MED ORDER — ASPIRIN EC 81 MG PO TBEC
81.0000 mg | DELAYED_RELEASE_TABLET | Freq: Every day | ORAL | Status: DC
  Administered 2020-06-12: 81 mg via ORAL
  Filled 2020-06-11: qty 1

## 2020-06-11 MED ORDER — FLEET ENEMA 7-19 GM/118ML RE ENEM: 1.0000 | ENEMA | Freq: Once | RECTAL | Status: DC | PRN

## 2020-06-11 MED ORDER — ONDANSETRON HCL 4 MG/2ML IJ SOLN: 4.0000 mg | Freq: Four times a day (QID) | INTRAMUSCULAR | Status: DC | PRN

## 2020-06-11 NOTE — H&P (Signed)
History and Physical    [name] DGU:[id number] DOB: Feb 28, 1941 DOA: 06/10/2020  PCP: Deland Pretty, MD Consultants:  Annamaria Boots - pulmonology; Donzetta Matters - vascular surgery; [facility] - oncology; Zigmund Daniel - urology; Buccini - GI Patient coming from: Home - lives alone; NOK: [name], Cook, [phone]   Chief Complaint: SOB  HPI: [name] is a 80 y.o. female with medical history significant of COPD (not on home O2); PE (2020); HTN; HLD; and depression presenting with SOB.  She reports that she is vaccinated against COVID but not boosted.  She started feeling weak with cough about 1/1 and thought she was getting better.  She has chronic COPD and does not usually wear home O2 - but apparently has some at home if needed.  She called her PCP on 1/11 and c/o back in her left back, buttock and groin; she was given an appointment on 1/21 and stated "reckon I'll be dead by then."  She did not mention respiratory symptoms then but called back the following day to [name] with c/o wheezing and DOE x 1 week.  The following day, on 1/13, she reported feeling better and stated she did not need to be seen.  She then apparently went to her PCP and was told she had double pneumonia and was sent to the ER.     ED Course: [place], per Dr. Hal Hope:  Patient presents with shortness of breath and wheezing is found to have bilateral infiltrates COVID test positive was started on remdesivir and steroids. On 2 L oxygen.   Review of Systems: As per HPI; otherwise review of systems reviewed and negative.   Ambulatory Status:  Ambulates without assistance  COVID Vaccine Status:   Complete, no booster  Past Medical History:  Diagnosis Date  . Allergic rhinitis   . Anemia due to GI blood loss   . Asthma   . Chronic airway obstruction, not elsewhere classified   . Depressive disorder, not elsewhere classified   . DVT (deep venous thrombosis) (HCC)    LLE DVT 01/13/19  . Esophageal reflux   . Essential  hypertension 08/27/2019  . PE (pulmonary thromboembolism) ([place]) 01/29/2019  . Pneumonia   . Wears partial dentures     Past Surgical History:  Procedure Laterality Date  . ABDOMINAL HYSTERECTOMY    . ABDOMINAL HYSTERECTOMY    . CATARACT EXTRACTION W/ INTRAOCULAR LENS  IMPLANT, BILATERAL    . COLONOSCOPY    . COLONOSCOPY WITH PROPOFOL N/A 08/20/2019   Procedure: COLONOSCOPY WITH PROPOFOL;  Surgeon: Ronald Lobo, MD;  Location: [place];  Service: Endoscopy;  Laterality: N/A;  . CYSTOSCOPY    . DILATION AND CURETTAGE OF UTERUS    . ESOPHAGOGASTRODUODENOSCOPY (EGD) WITH PROPOFOL N/A 08/20/2019   Procedure: ESOPHAGOGASTRODUODENOSCOPY (EGD) WITH PROPOFOL;  Surgeon: Ronald Lobo, MD;  [facility];  Service: Endoscopy;  Laterality: N/A;  . HEMOSTASIS CONTROL  08/20/2019   Procedure: HEMOSTASIS CONTROL;  Surgeon: Ronald Lobo, MD;  Location: [facility] ENDOSCOPY;  Service: Endoscopy;;  . INTRAVASCULAR ULTRASOUND/IVUS Left 05/26/2019   Procedure: Intravascular Ultrasound/IVUS;  Surgeon: Waynetta Sandy, MD;  Location: [place] CV [facility];  Service: Cardiovascular;  Laterality: Left;  . IVC FILTER REMOVAL N/A 03/24/2019   Procedure: IVC FILTER REMOVAL;  Surgeon: Waynetta Sandy, MD;  Location: [place] [facility];  Service: Cardiovascular;  Laterality: N/A;  . LOWER EXTREMITY VENOGRAPHY Left 05/26/2019   Procedure: LOWER EXTREMITY VENOGRAPHY;  Surgeon: Waynetta Sandy, MD;  Location: [place] [facility];  Service: Cardiovascular;  Laterality: Left;  Marland Kitchen MULTIPLE TOOTH EXTRACTIONS    . NASAL SINUS SURGERY    . PERIPHERAL VASCULAR INTERVENTION Left 05/26/2019   Procedure: PERIPHERAL VASCULAR INTERVENTION;  Surgeon: Waynetta Sandy, MD;  Location: [place] [facility];  Service: Cardiovascular;  Laterality: Left;  lower extremity veins  . PERIPHERAL VASCULAR THROMBECTOMY  05/26/2019   Procedure: PERIPHERAL VASCULAR THROMBECTOMY;  Surgeon: Waynetta Sandy, MD;  Location: [place] [facility];  Service: Cardiovascular;;  . TUBAL LIGATION    . VENA CAVA FILTER PLACEMENT N/A 01/13/2019   Procedure: INSERTION VENA-CAVA FILTER;  Surgeon: Waynetta Sandy, MD;  Location: [place];  Service: Vascular;  Laterality: N/A;    Social History   Socioeconomic History  . Marital status: Divorced    Spouse name: Not on file  . Number of children: Not on file  . Years of education: Not on file  . Highest education level: Not on file  Occupational History  . Occupation: Disabled  Tobacco Use  . Smoking status: Former Smoker    Packs/day: 0.25    Years: 40.00    Pack years: 10.00    Types: Cigarettes    Quit date: 05/27/2013    Years since quitting: 7.0  . Smokeless tobacco: Never Used  Vaping Use  . Vaping Use: Never used  Substance and Sexual Activity  . Alcohol use: No  . Drug use: No  . Sexual activity: Not on file  Other Topics Concern  . Not on file  Social History Narrative  . Not on file   Social Determinants of Health   Financial Resource Strain: Not on file  Food Insecurity: Not on file  Transportation Needs: Not on file  Physical Activity: Not on file  Stress: Not on file  Social Connections: Not on file  Intimate Partner Violence: Not on file    Allergies  Allergen Reactions  . Pneumococcal Vaccines Shortness Of Breath and Other (See Comments)    Couldn't breathe   . Clarithromycin Swelling  . Estradiol Rash    Reaction to cream  . Sulfa Antibiotics Nausea And Vomiting and Swelling  . Penicillins Swelling    Tongue swelling Did it involve swelling of the face/tongue/throat, SOB, or low BP? Yes Did it involve sudden or severe rash/hives, skin peeling, or any reaction on the inside of your mouth or nose? Yes Did you need to seek medical attention at a hospital or doctor's office? Yes When did it last happen?childhood If all above answers are "NO", may proceed with cephalosporin use.   .  Sulfonamide Derivatives Swelling    Family History  Problem Relation Age of Onset  . Diabetes Mother   . Coronary artery disease Mother   . Emphysema Father   . Coronary artery disease Brother     Prior to Admission medications   Medication Sig Start Date End Date Taking? Authorizing Provider  albuterol (PROVENTIL) (2.5 MG/3ML) 0.083% nebulizer solution Take 3 mLs (2.5 mg total) by nebulization every 6 (six) hours as needed for wheezing or shortness of breath. 06/30/19   Deneise Lever, MD  albuterol (VENTOLIN HFA) 108 (90 Base) MCG/ACT inhaler Inhale 1-2 puffs into the lungs every 6 (six) hours as needed for wheezing or shortness of breath.    [provider]  apixaban (ELIQUIS) 5 MG TABS tablet Take 1 tablet (5 mg total) by mouth 2 (two) times daily. Start on 08/31/19 08/31/19   Flora Lipps, MD  aspirin EC 81 MG  EC tablet Take 1 tablet (81 mg total) by mouth daily. 01/17/19   Bonnielee Haff, MD  ferrous sulfate 325 (65 FE) MG tablet Take 325 mg by mouth daily with breakfast.    [provider]  Fluticasone-Salmeterol (ADVAIR) 250-50 MCG/DOSE AEPB INHALE 1 DOSE BY MOUTH TWICE DAILY RINSE  MOUTH  AFTER  USE Patient taking differently: Inhale 1 puff into the lungs in the morning and at bedtime. , RINSE MOUTH AFTER USE 06/13/19   Baird Lyons D, MD  LORazepam (ATIVAN) 1 MG tablet Take 0.5-1 mg by mouth at bedtime. Additional 1 mg if needed    [provider]  nitrofurantoin, macrocrystal-monohydrate, (MACROBID) 100 MG capsule Take 100 mg by mouth daily. 08/18/19   [provider]  omeprazole (PRILOSEC) 10 MG capsule Take 10 mg by mouth daily.  09/13/11   [provider]  ondansetron (ZOFRAN) 4 MG tablet Take 1 tablet (4 mg total) by mouth every 8 (eight) hours as needed for up to 15 doses for nausea or vomiting. 11/22/19   Curatolo, Adam, DO  potassium chloride (KLOR-CON) 8 MEQ tablet Take 8 mEq by mouth daily. 02/07/19   [provider]   Tiotropium Bromide Monohydrate (SPIRIVA RESPIMAT) 2.5 MCG/ACT AERS Inhale 1 puff into the lungs daily with lunch.     [provider]  traZODone (DESYREL) 100 MG tablet Take 100 mg by mouth at bedtime.     [provider]  triamterene-hydrochlorothiazide (MAXZIDE-25) 37.5-25 MG tablet Take 0.5 tablets by mouth daily. 02/06/19   [provider]    Physical Exam: Vitals:   06/11/20 1245 06/11/20 1300 06/11/20 1400 06/11/20 1430  BP:  131/84 121/84 120/76  Pulse: 84 82 87 93  Resp: 20 17 (!) 21 19  Temp:      TempSrc:      SpO2: 95% 94% 100% 95%  Weight:      Height:         . General:  Appears calm and comfortable and is in NAD, on 2L [place] O2 . Eyes:  PERRL, EOMI, normal lids, iris . ENT:  grossly normal hearing, lips & tongue, mmm; appropriate dentition . Neck:  no LAD, masses or thyromegaly . Cardiovascular:  RRR, no m/r/g. No LE edema.  Marland Kitchen Respiratory:   Scattered rhonchi.  Normal respiratory effort. . Abdomen:  soft, NT, ND, NABS . Back:   normal alignment, no CVAT . Skin:  no rash or induration seen on limited exam . Musculoskeletal:  grossly normal tone BUE/BLE, good ROM, no bony abnormality . Psychiatric:  grossly normal mood and affect, speech fluent and appropriate, AOx3 . Neurologic:  CN 2-12 grossly intact, moves all extremities in coordinated fashion    Radiological Exams on Admission: Independently reviewed - see discussion in A/P where applicable  DG Chest 2 View  Result Date: 06/10/2020 CLINICAL DATA:  Dyspnea.  Shortness of breath. EXAM: CHEST - 2 VIEW COMPARISON:  June 10, 2020 FINDINGS: Again noted are bilateral hazy airspace opacities, greatest in the right lung zone. These appear to have slightly improved from prior study. The heart size is stable. Aortic calcifications are noted. There is no pneumothorax. The lungs appear to be hyperexpanded. There are likely underlying emphysematous changes. There is no acute osseous abnormality.  IMPRESSION: Persistent hazy bilateral airspace opacities, greatest in the right mid lung zone, somewhat improved from recent prior study which may be in part due to technique. Electronically Signed   By: Constance Holster M.D.   On: 06/10/2020  18:19    EKG: not done    Labs on Admission: I have personally reviewed the available labs and imaging studies at the time of the admission.  Pertinent labs:   K+ 3.1 BUN 20/Creatinine 1.23/GFR 45; 15/0.94/59 on 11/22/19 WBC 7.1 Hgb 15.8 Platelets 418 COVID POSITIVE Ferritin 196 CRP 5.2 D-dimer 1.15 Fibrinogen 511   Assessment/Plan Principal Problem:   Acute respiratory failure due to COVID-19 [facility]) Active Problems:   COPD mixed type (HCC)   Essential hypertension   Dyslipidemia   Acute respiratory failure with hypoxia due to COVID-19 PNA -Patient with presenting with SOB and fatigue, reportedly improving but then it isn't clear why she decided to go to the PCP (diagnosed with multilobar PNA) -Mild anorexia noted without the presence of other GI symptoms (prior diarrhea, now improved) -She does not have a usual home O2 requirement and is currently requiring 2L [place] O2 -COVID POSITIVE -The patient has comorbidities which may increase the risk for ARDS/MODS including: age, HTN, COPD -Pertinent labs concerning for COVID include normal WBC; increased BUN/Creatinine;  markedly elevated D-dimer (>1); elevated CRP (not >7); increased fibrinogen -CXR with multifocal opacities which may be c/w COVID vs. Multifocal PNA -Will not treat with broad-spectrum antibiotics if procalcitonin <0.5 -Will admit for further evaluation, close monitoring, and treatment -Monitor on telemetry x at least 24 hours -At this time, will attempt to avoid use of aerosolized medications and use HFAs instead -Will check daily labs including BMP with Mag, Phos; LFTs; CBC with differential; CRP; ferritin; fibrinogen; D-dimer -Will order steroids and Remdesivir (pharmacy  consult) given +COVID test, +CXR, and hypoxia <94% on room air -If the patient shows clinical deterioration, consider transfer to ICU with PCCM consultation -Consider IL-6 agonist (Actemra) and/or JAK inhibitor (baricitinib) if the patient does not stabilize on current treatment or if the patient has marked clinical decompensation; the patient does not appear to require this treatment at this time -Will attempt to maintain euvolemia to a net negative fluid status -Will ask the patient to maintain an awake prone position for 16+ hours a day, if possible, with a minimum of 2-3 hours at a time -Patient was seen wearing full PPE including: gown, gloves, head cover, N95, and face shield; donning and doffing was in compliance with current standards.  h/o PE -Patient with prior extensive PE in 01/2019 -While this was her first PE, given that it was unprovoked and with the extensive nature of the clot hematology recommended lifelong AC -Eliquis was discontinued in July -Will continue ASA for now -Consider CTA to evaluate for PE if she has escalating O2 requirements  COPD -Continue Albuterol HFA, Advair ([facility] formulary substitution)  HTN -Continue Maxzide (resume tomorrow, received IVF today for mildly increased creatinine)  UTI prophylaxis -Appears to be no longer taking Macrobid for prophylaxis  Depression/anxiety -Continue Ativan, Trazodone     DVT prophylaxis:  Lovenox  Code Status:  Full - confirmed with patient Family Communication: None present; I spoke with the patient's son by telephone. Disposition Plan:  The patient is from: home  Anticipated d/c is to: home without [facility] services once her respiratory issues have been resolved.  She may require home O2 at the time of discharge.  Anticipated d/c date will depend on clinical response to treatment, likely between 3 days (with completion of outpatient Remdesivir treatment) and 5 days  Patient is currently: acutely ill Consults  called: None  Admission status: Admit - It is my clinical opinion that admission to INPATIENT is reasonable  and necessary because of the expectation that this patient will require hospital care that crosses at least 2 midnights to treat this condition based on the medical complexity of the problems presented.  Given the aforementioned information, the predictability of an adverse outcome is felt to be significant.      Karmen Bongo MD [facility]   How to contact the [facility] Attending or Consulting provider [place] or covering provider during after hours [place], for this patient?  1. Check the care team in [facility] and look for a) attending/consulting [facility] provider listed and b) the [facility] team listed 2. Log into [email or web address] and use [place] universal password to access. If you do not have the password, please contact the hospital operator. 3. Locate the [facility] provider you are looking for under [facility] and page to a number that you can be directly reached. 4. If you still have difficulty reaching the provider, please page the [facility] (Director on Call) for the Hospitalists listed on amion for assistance.   06/11/2020, 4:19 PM

## 2020-06-11 NOTE — ED Notes (Signed)
Patient called out concerned about oxygen level. Pulse oximeter sensor malfunctioning and replaced with new sensor, SpO2 on 2L [place] 96%. Patient reported that she did not feel more short of breath, but was concerned that device was not reading correctly.

## 2020-06-11 NOTE — ED Notes (Signed)
Provided pt with hygiene pack (toothbrush, toothpaste, body wash, body lotion, and hair brush) and wash cloth at the sink.

## 2020-06-11 NOTE — ED Notes (Signed)
Hospitalist at bedside 

## 2020-06-11 NOTE — ED Notes (Signed)
Transferred pulse ox to right index finger, oximetry picking up pts sats.

## 2020-06-11 NOTE — ED Notes (Signed)
Lunch Tray Ordered @ 1033. 

## 2020-06-11 NOTE — ED Provider Notes (Signed)
[facility] EMERGENCY DEPARTMENT Provider Note   CSN: [id number] Arrival date & time: 06/10/20  1745     History Chief Complaint  Patient presents with  . Pneumonia    [name] is a 80 y.o. female.  HPI 80 year old female presents the ER for several days of fatigue, weakness, cough, shortness of breath.  Patient with past medical history significant for COPD.  She is on as needed oxygen at home but does not wear this daily.  She reports increased shortness of breath.  No chest pain.  Cough is not productive.  History of PE and DVT not anticoagulated.  She was seen by her primary care doctor today who did an x-ray that showed that she had pneumonia and she was hypoxic and sent patient to the ER by EMS for further evaluation.  Patient states that she has not had any chest pain.  No documented fevers or chills.  Of note she is vaccinated against COVID-19.  No alleviating or aggravating factors.  Patient does not use tobacco.    Past Medical History:  Diagnosis Date  . Allergic rhinitis   . Anemia due to GI blood loss   . Asthma   . Chronic airway obstruction, not elsewhere classified   . Depressive disorder, not elsewhere classified   . DVT (deep venous thrombosis) (HCC)    LLE DVT 01/13/19  . Esophageal reflux   . Essential hypertension 08/27/2019  . PE (pulmonary thromboembolism) ([place]) 01/29/2019  . Pneumonia   . Wears partial dentures     Patient Active Problem List   Diagnosis Date Noted  . Acute lower GI bleeding 08/27/2019  . History of pulmonary embolism 08/27/2019  . Hyperglycemia 08/27/2019  . Essential hypertension 08/27/2019  . DVT, recurrent, lower extremity, chronic, left 01/28/2019  . Acute pulmonary embolism with acute cor pulmonale ([place]) 01/28/2019  . Hypokalemia 01/28/2019  . Retroperitoneal bleed 01/13/2019  . CAP (community acquired pneumonia) 05/27/2013  . SOB (shortness of breath) 05/27/2013  . Myalgia 05/27/2013  . COPD with  acute exacerbation ([place]) 05/27/2013  . Acute respiratory failure with hypoxia ([place]) 05/27/2013  . Vertebral compression fracture ([place]) 09/15/2010  . SCIATICA 12/16/2007  . COPD mixed type ([place]) 07/17/2007  . ALLERGIC RHINITIS 07/16/2007  . BRONCHITIS 07/16/2007  . ASTHMA 07/16/2007  . Esophageal reflux 07/16/2007    Past Surgical History:  Procedure Laterality Date  . ABDOMINAL HYSTERECTOMY    . ABDOMINAL HYSTERECTOMY    . CATARACT EXTRACTION W/ INTRAOCULAR LENS  IMPLANT, BILATERAL    . COLONOSCOPY    . COLONOSCOPY WITH PROPOFOL N/A 08/20/2019   Procedure: COLONOSCOPY WITH PROPOFOL;  Surgeon: Ronald Lobo, MD;  Location: [facility];  Service: Endoscopy;  Laterality: N/A;  . CYSTOSCOPY    . DILATION AND CURETTAGE OF UTERUS    . ESOPHAGOGASTRODUODENOSCOPY (EGD) WITH PROPOFOL N/A 08/20/2019   Procedure: ESOPHAGOGASTRODUODENOSCOPY (EGD) WITH PROPOFOL;  Surgeon: Ronald Lobo, MD;  Location: [facility];  Service: Endoscopy;  Laterality: N/A;  . HEMOSTASIS CONTROL  08/20/2019   Procedure: HEMOSTASIS CONTROL;  Surgeon: Ronald Lobo, MD;  Location: [facility] ENDOSCOPY;  Service: Endoscopy;;  . INTRAVASCULAR ULTRASOUND/IVUS Left 05/26/2019   Procedure: Intravascular Ultrasound/IVUS;  Surgeon: Waynetta Sandy, MD;  Location: [facility] CV LAB;  Service: Cardiovascular;  Laterality: Left;  . IVC FILTER REMOVAL N/A 03/24/2019   Procedure: IVC FILTER REMOVAL;  Surgeon: Waynetta Sandy, MD;  Location: [facility];  Service: Cardiovascular;  Laterality: N/A;  . LOWER EXTREMITY VENOGRAPHY  Left 05/26/2019   Procedure: LOWER EXTREMITY VENOGRAPHY;  Surgeon: Waynetta Sandy, MD;  Location: [facility];  Service: Cardiovascular;  Laterality: Left;  Marland Kitchen MULTIPLE TOOTH EXTRACTIONS    . NASAL SINUS SURGERY    . PERIPHERAL VASCULAR INTERVENTION Left 05/26/2019   Procedure: PERIPHERAL VASCULAR INTERVENTION;  Surgeon: Waynetta Sandy, MD;  Location: [facility];  Service: Cardiovascular;  Laterality: Left;  lower extremity veins  . PERIPHERAL VASCULAR THROMBECTOMY  05/26/2019   Procedure: PERIPHERAL VASCULAR THROMBECTOMY;  Surgeon: Waynetta Sandy, MD;  Location: [facility];  Service: Cardiovascular;;  . TUBAL LIGATION    . VENA CAVA FILTER PLACEMENT N/A 01/13/2019   Procedure: INSERTION VENA-CAVA FILTER;  Surgeon: Waynetta Sandy, MD;  Location: [facility];  Service: Vascular;  Laterality: N/A;     OB History   No obstetric history on file.     Family History  Problem Relation Age of Onset  . Diabetes Mother   . Coronary artery disease Mother   . Emphysema Father   . Coronary artery disease Brother     Social History   Tobacco Use  . Smoking status: Former Smoker    Packs/day: 0.25    Years: 40.00    Pack years: 10.00    Types: Cigarettes    Quit date: 05/27/2013    Years since quitting: 7.0  . Smokeless tobacco: Never Used  Vaping Use  . Vaping Use: Never used  Substance Use Topics  . Alcohol use: No  . Drug use: No    Home Medications Prior to Admission medications   Medication Sig Start Date End Date Taking? Authorizing Provider  albuterol (PROVENTIL) (2.5 MG/3ML) 0.083% nebulizer solution Take 3 mLs (2.5 mg total) by nebulization every 6 (six) hours as needed for wheezing or shortness of breath. 06/30/19   Deneise Lever, MD  albuterol (VENTOLIN HFA) 108 (90 Base) MCG/ACT inhaler Inhale 1-2 puffs into the lungs every 6 (six) hours as needed for wheezing or shortness of breath.    [provider]  apixaban (ELIQUIS) 5 MG TABS tablet Take 1 tablet (5 mg total) by mouth 2 (two) times daily. Start on 08/31/19 08/31/19   Pokhrel, Corrie Mckusick, MD  aspirin EC 81 MG EC tablet Take 1 tablet (81 mg total) by mouth daily. 01/17/19   Bonnielee Haff, MD  ferrous sulfate 325 (65 FE) MG tablet Take 325 mg by mouth daily with breakfast.    [provider]  Fluticasone-Salmeterol (ADVAIR)  250-50 MCG/DOSE AEPB INHALE 1 DOSE BY MOUTH TWICE DAILY RINSE  MOUTH  AFTER  USE Patient taking differently: Inhale 1 puff into the lungs in the morning and at bedtime. , RINSE MOUTH AFTER USE 06/13/19   Baird Lyons D, MD  LORazepam (ATIVAN) 1 MG tablet Take 0.5-1 mg by mouth at bedtime. Additional 1 mg if needed    [provider]  nitrofurantoin, macrocrystal-monohydrate, (MACROBID) 100 MG capsule Take 100 mg by mouth daily. 08/18/19   [provider]  omeprazole (PRILOSEC) 10 MG capsule Take 10 mg by mouth daily.  09/13/11   [provider]  ondansetron (ZOFRAN) 4 MG tablet Take 1 tablet (4 mg total) by mouth every 8 (eight) hours as needed for up to 15 doses for nausea or vomiting. 11/22/19   Curatolo, Adam, DO  potassium chloride (KLOR-CON) 8 MEQ tablet Take 8 mEq by mouth daily. 02/07/19   [provider]  Tiotropium Bromide Monohydrate (SPIRIVA RESPIMAT) 2.5 MCG/ACT AERS  Inhale 1 puff into the lungs daily with lunch.     [provider]  traZODone (DESYREL) 100 MG tablet Take 100 mg by mouth at bedtime.     [provider]  triamterene-hydrochlorothiazide (MAXZIDE-25) 37.5-25 MG tablet Take 0.5 tablets by mouth daily. 02/06/19   [provider]    Allergies    Pneumococcal vaccines, Clarithromycin, Estradiol, Sulfa antibiotics, Penicillins, and Sulfonamide derivatives  Review of Systems   Review of Systems  Constitutional: Positive for fatigue. Negative for chills and fever.  HENT: Negative for congestion.   Eyes: Negative for discharge.  Respiratory: Positive for cough, shortness of breath and wheezing.   Cardiovascular: Negative for chest pain.  Gastrointestinal: Negative for abdominal pain, diarrhea, nausea and vomiting.  Genitourinary: Negative for difficulty urinating.  Musculoskeletal: Negative for myalgias.  Skin: Negative for rash.  Neurological: Positive for weakness. Negative for headaches.   Psychiatric/Behavioral: Negative for confusion.    Physical Exam Updated Vital Signs BP 123/88   Pulse 87   Temp 98.5 F (36.9 C) (Oral)   Resp 19   Ht 5\' 6"  (1.676 m)   Wt 73 kg   SpO2 96%   BMI 25.99 kg/m   Physical Exam Vitals and nursing note reviewed.  Constitutional:      General: She is not in acute distress.    Appearance: She is well-developed and well-nourished. She is not ill-appearing or toxic-appearing.  HENT:     Head: Normocephalic and atraumatic.  Eyes:     General: No scleral icterus.       Right eye: No discharge.        Left eye: No discharge.  Cardiovascular:     Rate and Rhythm: Normal rate and regular rhythm.     Pulses: Normal pulses.     Heart sounds: Normal heart sounds.  Pulmonary:     Effort: Pulmonary effort is normal. No respiratory distress.     Breath sounds: Wheezing, rhonchi and rales present.  Musculoskeletal:        General: Normal range of motion.     Cervical back: Normal range of motion.  Skin:    General: Skin is warm and dry.     Coloration: Skin is not pale.  Neurological:     Mental Status: She is alert.  Psychiatric:        Mood and Affect: Mood normal.        Behavior: Behavior normal.        Thought Content: Thought content normal.        Judgment: Judgment normal.     ED Results / Procedures / Treatments   Labs (all labs ordered are listed, but only abnormal results are displayed) Labs Reviewed  BASIC METABOLIC PANEL - Abnormal; Notable for the following components:      Result Value   Potassium 3.1 (*)    Creatinine, Ser 1.23 (*)    GFR, Estimated 45 (*)    All other components within normal limits  CBC - Abnormal; Notable for the following components:   RBC 5.37 (*)    Hemoglobin 15.8 (*)    HCT 49.5 (*)    Platelets 418 (*)    All other components within normal limits  RESP PANEL BY RT-PCR (FLU A&B, COVID) [id number]    EKG None  Radiology DG Chest 2 View  Result Date: 06/10/2020 CLINICAL DATA:   Dyspnea.  Shortness of breath. EXAM: CHEST - 2 VIEW COMPARISON:  June 10, 2020 FINDINGS: Again noted are  bilateral hazy airspace opacities, greatest in the right lung zone. These appear to have slightly improved from prior study. The heart size is stable. Aortic calcifications are noted. There is no pneumothorax. The lungs appear to be hyperexpanded. There are likely underlying emphysematous changes. There is no acute osseous abnormality. IMPRESSION: Persistent hazy bilateral airspace opacities, greatest in the right mid lung zone, somewhat improved from recent prior study which may be in part due to technique. Electronically Signed   By: Constance Holster M.D.   On: 06/10/2020 18:19    Procedures Procedures (including critical care time)  Medications Ordered in ED Medications  cefTRIAXone (ROCEPHIN) 1 g in sodium chloride 0.9 % 100 mL IVPB (0 g Intravenous Stopped 06/11/20 0424)  doxycycline (VIBRA-TABS) tablet 100 mg (100 mg Oral Given 06/11/20 0356)  methylPREDNISolone sodium succinate (SOLU-MEDROL) 125 mg/2 mL injection 125 mg (125 mg Intravenous Given 06/11/20 0355)  0.9 %  sodium chloride infusion ( Intravenous Bolus from Bag 06/11/20 0355)  potassium chloride SA (KLOR-CON) CR tablet 40 mEq (40 mEq Oral Given 06/11/20 0356)    ED Course  I have reviewed the triage vital signs and the nursing notes.  Pertinent labs & imaging results that were available during my care of the patient were reviewed by me and considered in my medical decision making (see chart for details).    MDM Rules/Calculators/A&P                         80 year old presents the ER for evaluation of cough, shortness of breath.  Sent here for concern for pneumonia and hypoxia.  Patient noted be 88% on room air on arrival to the ER.  Patient on 2 L of O2 therapy.  Patient is lungs with wheezing noted.  History of COPD.  Labs and imaging were reviewed.  The patient has no leukocytosis.  Hemoconcentrated with a hemoglobin of  15.8.  Potassium is 3.1 and a creatinine of 1.23 mild AKI patient given fluids in the ER.  COVID test was positive.  Chest x-ray shows concern for bilateral basilar opacities.  Started on antibiotics.  Along with remdesivir.  Will need admission for hypoxemia secondary to COVID-19.  Patient updated on plan of care remains hemodynamic stable.  Case discussed with Dr. Hal Hope who accepts patient for admission  Final Clinical Impression(s) / ED Diagnoses Final diagnoses:  Community acquired pneumonia, unspecified laterality  COVID-19    Rx / DC Orders ED Discharge Orders    None       Aaron Edelman 06/11/20 [id number]    Fatima Blank, MD 06/11/20 1928

## 2020-06-12 DIAGNOSIS — U071 COVID-19: Secondary | ICD-10-CM | POA: Diagnosis not present

## 2020-06-12 DIAGNOSIS — J9600 Acute respiratory failure, unspecified whether with hypoxia or hypercapnia: Secondary | ICD-10-CM | POA: Diagnosis not present

## 2020-06-12 LAB — CBC WITH DIFFERENTIAL/PLATELET
Abs Immature Granulocytes: 0.04 10*3/uL (ref 0.00–0.07)
Basophils Absolute: 0 10*3/uL (ref 0.0–0.1)
Basophils Relative: 0 %
Eosinophils Absolute: 0 10*3/uL (ref 0.0–0.5)
Eosinophils Relative: 0 %
HCT: 43.9 % (ref 36.0–46.0)
Hemoglobin: 13.9 g/dL (ref 12.0–15.0)
Immature Granulocytes: 1 %
Lymphocytes Relative: 10 %
Lymphs Abs: 0.8 10*3/uL (ref 0.7–4.0)
MCH: 29.5 pg (ref 26.0–34.0)
MCHC: 31.7 g/dL (ref 30.0–36.0)
MCV: 93.2 fL (ref 80.0–100.0)
Monocytes Absolute: 0.8 10*3/uL (ref 0.1–1.0)
Monocytes Relative: 9 %
Neutro Abs: 6.8 10*3/uL (ref 1.7–7.7)
Neutrophils Relative %: 80 %
Platelets: 423 10*3/uL — ABNORMAL HIGH (ref 150–400)
RBC: 4.71 MIL/uL (ref 3.87–5.11)
RDW: 12.6 % (ref 11.5–15.5)
WBC: 8.4 10*3/uL (ref 4.0–10.5)
nRBC: 0 % (ref 0.0–0.2)

## 2020-06-12 LAB — D-DIMER, QUANTITATIVE: D-Dimer, Quant: 2.27 ug/mL-FEU — ABNORMAL HIGH (ref 0.00–0.50)

## 2020-06-12 LAB — COMPREHENSIVE METABOLIC PANEL
ALT: 40 U/L (ref 0–44)
AST: 31 U/L (ref 15–41)
Albumin: 3.1 g/dL — ABNORMAL LOW (ref 3.5–5.0)
Alkaline Phosphatase: 56 U/L (ref 38–126)
Anion gap: 8 (ref 5–15)
BUN: 23 mg/dL (ref 8–23)
CO2: 24 mmol/L (ref 22–32)
Calcium: 8.6 mg/dL — ABNORMAL LOW (ref 8.9–10.3)
Chloride: 106 mmol/L (ref 98–111)
Creatinine, Ser: 0.9 mg/dL (ref 0.44–1.00)
GFR, Estimated: >60 mL/min (ref >60)
Glucose, Bld: 125 mg/dL — ABNORMAL HIGH (ref 70–99)
Potassium: 3.7 mmol/L (ref 3.5–5.1)
Sodium: 138 mmol/L (ref 135–145)
Total Bilirubin: 1 mg/dL (ref 0.3–1.2)
Total Protein: 5.9 g/dL — ABNORMAL LOW (ref 6.5–8.1)

## 2020-06-12 LAB — PHOSPHORUS: Phosphorus: 3.4 mg/dL (ref 2.5–4.6)

## 2020-06-12 LAB — C-REACTIVE PROTEIN: CRP: 4.1 mg/dL — ABNORMAL HIGH (ref <1.0)

## 2020-06-12 LAB — PROCALCITONIN: Procalcitonin: <0.1 ng/mL

## 2020-06-12 LAB — MAGNESIUM: Magnesium: 2 mg/dL (ref 1.7–2.4)

## 2020-06-12 LAB — FERRITIN: Ferritin: 137 ng/mL (ref 11–307)

## 2020-06-12 MED ORDER — ENOXAPARIN SODIUM 40 MG/0.4ML ~~LOC~~ SOLN: 40.0000 mg | SUBCUTANEOUS | Status: DC

## 2020-06-12 MED ORDER — PREDNISONE 10 MG PO TABS: ORAL_TABLET | ORAL | 0 refills | Status: AC

## 2020-06-12 NOTE — ED Notes (Signed)
Breakfast Ordered 

## 2020-06-12 NOTE — ED Notes (Signed)
Pt ambulated ~59ft around room. On 3LPM O2 via  we maintained an SpO2 of 90% w/ HR 110. Pt states they feel fine.

## 2020-06-12 NOTE — Discharge Summary (Signed)
Physician Discharge Summary  [name] [id number] DOB: 02/08/1941 DOA: 06/10/2020  PCP: Deland Pretty, MD  Admit date: 06/10/2020 Discharge date: 06/12/2020  Admitted From: Home Disposition: Home  Recommendations for Outpatient Follow-up:  1. Follow up with PCP in 1-2 weeks 2. Please obtain BMP/CBC in one week  Home Health: None Equipment/Devices: 3 L oxygen  Discharge Condition: Stable CODE STATUS: Full Diet recommendation: As tolerated  Brief/Interim Summary: [name] is a 80 y.o. female with medical history significant of COPD (on 2 to 3 L nasal cannula as needed at home); PE (2020-no longer on anticoagulation); HTN; HLD; and depression presenting with SOB.  She reports that she is vaccinated against COVID but not boosted.  She started feeling weak with cough about 1/1 and thought she was getting better.  She has chronic COPD and does not usually wear home O2 - but apparently has some at home if needed.  She called her PCP on 1/11 and c/o back in her left back, buttock and groin; she was given an appointment on 1/21 and stated "reckon I'll be dead by then."  She did not mention respiratory symptoms then but called back the following day to [name] with c/o wheezing and DOE x 1 week.  The following day, on 1/13, she reported feeling better and stated she did not need to be seen.  She then apparently went to her PCP and was told she had double pneumonia and was sent to the ER. Patient presents with shortness of breath and wheezing is found to have bilateral infiltrates COVID test positive was started on remdesivir and steroids.  Patient admitted as above with concerns for acute hypoxic respiratory failure in the setting of COVID-19 pneumonia, patient apparently does have oxygen at baseline at home 2 to 3 L as needed due to her chronic COPD and chronic respiratory failure.  Patient was complaining of some fatigue and weakness but has improved over the past 24 hours.  Given her  remarkable improvement, able to ambulate with no hypoxia or dyspnea on 3 L nasal cannula which is essentially her home oxygen levels she otherwise stable and agreeable for discharge home.  Patient discharged with remainder of steroid taper, recommend close follow-up with PCP in the next 3 to 5 days for ongoing oxygen screening and treatment.  Of note patient has history of unprovoked PE but is no longer on anticoagulation.  We had a lengthy discussion at bedside about possible need to resume her anticoagulation, she indicates there was a single episode of GI bleed but this was small, defer to PCP and cardiology as well as the vascular surgeons for ongoing protocol around PE treatment.  At this time continue her home aspirin only.   Discharge Diagnoses:  Principal Problem:   Acute respiratory failure due to COVID-19 [facility]) Active Problems:   COPD mixed type [facility])   Essential hypertension   Dyslipidemia    Discharge Instructions  Discharge Instructions    Diet - low sodium heart healthy   Complete by: As directed    Increase activity slowly   Complete by: As directed      Allergies as of 06/12/2020      Reactions   Pneumococcal Vaccines Shortness Of Breath, Other (See Comments)   Couldn't breathe   Clarithromycin Swelling   Estradiol Rash   Reaction to cream   Sulfa Antibiotics Nausea And Vomiting, Swelling   Penicillins Swelling   Tongue swelling Did it involve swelling of the face/tongue/throat, SOB, or  low BP? Yes Did it involve sudden or severe rash/hives, skin peeling, or any reaction on the inside of your mouth or nose? Yes Did you need to seek medical attention at a hospital or doctor's office? Yes When did it last happen?childhood If all above answers are "NO", may proceed with cephalosporin use.   Sulfonamide Derivatives Swelling      Medication List    TAKE these medications   acetaminophen 500 MG tablet Commonly known as: TYLENOL Take 500 mg by mouth every 6 (six)  hours as needed for headache (pain).   albuterol 108 (90 Base) MCG/ACT inhaler Commonly known as: VENTOLIN HFA Inhale 1-2 puffs into the lungs every 6 (six) hours as needed for wheezing or shortness of breath.   albuterol (2.5 MG/3ML) 0.083% nebulizer solution Commonly known as: PROVENTIL Take 3 mLs (2.5 mg total) by nebulization every 6 (six) hours as needed for wheezing or shortness of breath.   aspirin 81 MG EC tablet Take 1 tablet (81 mg total) by mouth daily.   cetirizine 10 MG tablet Commonly known as: ZYRTEC Take 10 mg by mouth daily as needed for allergies.   ferrous sulfate 325 (65 FE) MG tablet Take 325 mg by mouth daily as needed (weakness).   Fluticasone-Salmeterol 250-50 MCG/DOSE Aepb Commonly known as: ADVAIR INHALE 1 DOSE BY MOUTH TWICE DAILY RINSE  MOUTH  AFTER  USE What changed: See the new instructions.   LORazepam 1 MG tablet Commonly known as: ATIVAN Take 1 mg by mouth at bedtime.   omeprazole 20 MG capsule Commonly known as: PRILOSEC Take 20 mg by mouth daily.   predniSONE 10 MG tablet Commonly known as: DELTASONE Take 4 tablets (40 mg total) by mouth daily for 3 days, THEN 3 tablets (30 mg total) daily for 3 days, THEN 2 tablets (20 mg total) daily for 3 days, THEN 1 tablet (10 mg total) daily for 3 days. Start taking on: June 12, 2020   traZODone 100 MG tablet Commonly known as: DESYREL Take 100 mg by mouth at bedtime.   triamterene-hydrochlorothiazide 37.5-25 MG tablet Commonly known as: MAXZIDE-25 Take 0.5 tablets by mouth daily.            Durable Medical Equipment  (From admission, onward)         Start     Ordered   06/12/20 1220  DME Oxygen  Once       Question Answer Comment  Length of Need 6 Months   Mode or (Route) Nasal cannula   Liters per Minute 3   Frequency Continuous (stationary and portable oxygen unit needed)   Oxygen delivery system Gas      06/12/20 1219          Allergies  Allergen Reactions  .  Pneumococcal Vaccines Shortness Of Breath and Other (See Comments)    Couldn't breathe   . Clarithromycin Swelling  . Estradiol Rash    Reaction to cream  . Sulfa Antibiotics Nausea And Vomiting and Swelling  . Penicillins Swelling    Tongue swelling Did it involve swelling of the face/tongue/throat, SOB, or low BP? Yes Did it involve sudden or severe rash/hives, skin peeling, or any reaction on the inside of your mouth or nose? Yes Did you need to seek medical attention at a hospital or doctor's office? Yes When did it last happen?childhood If all above answers are "NO", may proceed with cephalosporin use.   . Sulfonamide Derivatives Swelling    Consultations: None  Procedures/Studies: DG  Chest 2 View  Result Date: 06/10/2020 CLINICAL DATA:  Dyspnea.  Shortness of breath. EXAM: CHEST - 2 VIEW COMPARISON:  June 10, 2020 FINDINGS: Again noted are bilateral hazy airspace opacities, greatest in the right lung zone. These appear to have slightly improved from prior study. The heart size is stable. Aortic calcifications are noted. There is no pneumothorax. The lungs appear to be hyperexpanded. There are likely underlying emphysematous changes. There is no acute osseous abnormality. IMPRESSION: Persistent hazy bilateral airspace opacities, greatest in the right mid lung zone, somewhat improved from recent prior study which may be in part due to technique. Electronically Signed   By: Constance Holster M.D.   On: 06/10/2020 18:19      Subjective: No acute issues or events overnight, back to baseline requesting discharge home which is certainly reasonable given her resolution of symptoms   Discharge Exam: Vitals:   06/12/20 1000 06/12/20 1100  BP: (!) 155/96 (!) 142/88  Pulse: 74 77  Resp: 20 (!) 22  Temp:    SpO2: 95% 98%   Vitals:   06/12/20 0705 06/12/20 0800 06/12/20 1000 06/12/20 1100  BP:  109/75 (!) 155/96 (!) 142/88  Pulse:  87 74 77  Resp:  14 20 (!) 22  Temp:       TempSrc:      SpO2: 94% 93% 95% 98%  Weight:      Height:        General: Pt is alert, awake, not in acute distress Cardiovascular: RRR, S1/S2 +, no rubs, no gallops Respiratory: CTA bilaterally, no wheezing, no rhonchi Abdominal: Soft, NT, ND, bowel sounds + Extremities: no edema, no cyanosis    The results of significant diagnostics from this hospitalization (including imaging, microbiology, ancillary and laboratory) are listed below for reference.     Microbiology: Recent Results (from the past 240 hour(s))  Resp Panel by RT-PCR (Flu A&B, Covid) Nasopharyngeal Swab     Status: Abnormal   Collection Time: 06/11/20  3:58 AM   Specimen: Nasopharyngeal Swab; Nasopharyngeal(NP) swabs in vial transport medium  Result Value Ref Range Status   SARS Coronavirus 2 by RT PCR POSITIVE (A) NEGATIVE Final    Comment: RESULT CALLED TO, READ BACK BY AND VERIFIED WITH: Eugene Garnet RN 06/11/20 0545 JDW (NOTE) SARS-CoV-2 target nucleic acids are DETECTED.  The SARS-CoV-2 RNA is generally detectable in upper respiratory specimens during the acute phase of infection. Positive results are indicative of the presence of the identified virus, but do not rule out bacterial infection or co-infection with other pathogens not detected by the test. Clinical correlation with patient history and other diagnostic information is necessary to determine patient infection status. The expected result is Negative.  Fact Sheet for Patients: EntrepreneurPulse.com.au  Fact Sheet for Healthcare Providers: IncredibleEmployment.be  This test is not yet approved or cleared by the [place] FDA and  has been authorized for detection and/or diagnosis of SARS-CoV-2 by FDA under an Emergency Use Authorization (EUA).  This EUA will remain in effect (meaning this test can be [place] ed) for the duration of  the COVID-19 declaration under Section 564(b)(1) of the Act, 21 U.S.C.  section 360bbb-3(b)(1), unless the authorization is terminated or revoked sooner.     Influenza A by PCR NEGATIVE NEGATIVE Final   Influenza B by PCR NEGATIVE NEGATIVE Final    Comment: (NOTE) The Xpert Xpress SARS-CoV-2/FLU/RSV plus assay is intended as an aid in the diagnosis of influenza from Nasopharyngeal swab specimens and should not be  used as a sole basis for treatment. Nasal washings and aspirates are unacceptable for Xpert Xpress SARS-CoV-2/FLU/RSV testing.  Fact Sheet for Patients: EntrepreneurPulse.com.au  Fact Sheet for Healthcare Providers: IncredibleEmployment.be  This test is not yet approved or cleared by the [place] FDA and has been authorized for detection and/or diagnosis of SARS-CoV-2 by FDA under an Emergency Use Authorization (EUA). This EUA will remain in effect (meaning this test can be used) for the duration of the COVID-19 declaration under Section 564(b)(1) of the Act, 21 U.S.C. section 360bbb-3(b)(1), unless the authorization is terminated or revoked.  Performed at [facility], [place] [address], [place], [place] [address]      Labs: BNP (last 3 results) No results for input(s): BNP in the last 8760 hours. Basic Metabolic Panel: Recent Labs  Lab 06/10/20 1803 06/12/20 0129  NA 139 138  K 3.1* 3.7  CL 98 106  CO2 28 24  GLUCOSE 97 125*  BUN 20 23  CREATININE 1.23* 0.90  CALCIUM 8.9 8.6*  MG  --  2.0  PHOS  --  3.4   Liver Function Tests: Recent Labs  Lab 06/12/20 0129  AST 31  ALT 40  ALKPHOS 56  BILITOT 1.0  PROT 5.9*  ALBUMIN 3.1*   No results for input(s): LIPASE, AMYLASE in the last 168 hours. No results for input(s): AMMONIA in the last 168 hours. CBC: Recent Labs  Lab 06/10/20 1803 06/12/20 0129  WBC 7.1 8.4  NEUTROABS  --  6.8  HGB 15.8* 13.9  HCT 49.5* 43.9  MCV 92.2 93.2  PLT 418* 423*   Cardiac Enzymes: No results for input(s): CKTOTAL, CKMB, CKMBINDEX,  TROPONINI in the last 168 hours. BNP: Invalid input(s): POCBNP CBG: No results for input(s): GLUCAP in the last 168 hours. D-Dimer Recent Labs    06/11/20 0806 06/12/20 0129  DDIMER 1.15* 2.27*   Hgb A1c No results for input(s): HGBA1C in the last 72 hours. Lipid Profile No results for input(s): CHOL, HDL, LDLCALC, TRIG, CHOLHDL, LDLDIRECT in the last 72 hours. Thyroid function studies No results for input(s): TSH, T4TOTAL, T3FREE, THYROIDAB in the last 72 hours.  Invalid input(s): FREET3 Anemia work up Recent Labs    06/11/20 0806 06/12/20 0129  FERRITIN 196 137   Urinalysis    Component Value Date/Time   COLORURINE YELLOW 11/22/2019 0539   APPEARANCEUR CLOUDY (A) 11/22/2019 0539   LABSPEC >1.030 (H) 11/22/2019 0539   PHURINE 5.5 11/22/2019 0539   GLUCOSEU NEGATIVE 11/22/2019 0539   HGBUR TRACE (A) 11/22/2019 0539   BILIRUBINUR SMALL (A) 11/22/2019 0539   KETONESUR NEGATIVE 11/22/2019 0539   PROTEINUR 30 (A) 11/22/2019 0539   NITRITE NEGATIVE 11/22/2019 0539   LEUKOCYTESUR SMALL (A) 11/22/2019 0539   Sepsis Labs Invalid input(s): PROCALCITONIN,  WBC,  LACTICIDVEN Microbiology Recent Results (from the past 240 hour(s))  Resp Panel by RT-PCR (Flu A&B, Covid) Nasopharyngeal Swab     Status: Abnormal   Collection Time: 06/11/20  3:58 AM   Specimen: Nasopharyngeal Swab; Nasopharyngeal(NP) swabs in vial transport medium  Result Value Ref Range Status   SARS Coronavirus 2 by RT PCR POSITIVE (A) NEGATIVE Final    Comment: RESULT CALLED TO, READ BACK BY AND VERIFIED WITH: Eugene Garnet RN 06/11/20 0545 JDW (NOTE) SARS-CoV-2 target nucleic acids are DETECTED.  The SARS-CoV-2 RNA is generally detectable in upper respiratory specimens during the acute phase of infection. Positive results are indicative of the presence of the identified virus, but do not rule  out bacterial infection or co-infection with other pathogens not detected by the test. Clinical correlation with  patient history and other diagnostic information is necessary to determine patient infection status. The expected result is Negative.  Fact Sheet for Patients: EntrepreneurPulse.com.au  Fact Sheet for Healthcare Providers: IncredibleEmployment.be  This test is not yet approved or cleared by the [place] FDA and  has been authorized for detection and/or diagnosis of SARS-CoV-2 by FDA under an Emergency Use Authorization (EUA).  This EUA will remain in effect (meaning this test can be [place] ed) for the duration of  the COVID-19 declaration under Section 564(b)(1) of the Act, 21 U.S.C. section 360bbb-3(b)(1), unless the authorization is terminated or revoked sooner.     Influenza A by PCR NEGATIVE NEGATIVE Final   Influenza B by PCR NEGATIVE NEGATIVE Final    Comment: (NOTE) The Xpert Xpress SARS-CoV-2/FLU/RSV plus assay is intended as an aid in the diagnosis of influenza from Nasopharyngeal swab specimens and should not be used as a sole basis for treatment. Nasal washings and aspirates are unacceptable for Xpert Xpress SARS-CoV-2/FLU/RSV testing.  Fact Sheet for Patients: EntrepreneurPulse.com.au  Fact Sheet for Healthcare Providers: IncredibleEmployment.be  This test is not yet approved or cleared by the [place] FDA and has been authorized for detection and/or diagnosis of SARS-CoV-2 by FDA under an Emergency Use Authorization (EUA). This EUA will remain in effect (meaning this test can be used) for the duration of the COVID-19 declaration under Section 564(b)(1) of the Act, 21 U.S.C. section 360bbb-3(b)(1), unless the authorization is terminated or revoked.  Performed at [facility], [place] [address] [place], [place] [address]      Time coordinating discharge: Over 30 minutes  SIGNED:   Little Ishikawa, DO [facility] 06/12/2020, 12:20 PM Pager   If 7PM-7AM, please  contact night-coverage [email or web address]

## 2020-06-12 NOTE — ED Notes (Signed)
First contact. Change of shift. Attempted to wean off O2, however, on RA spO2 fell to 86%, O2 reapplied. NADN. Will continue to monitor.

## 2020-06-24 DIAGNOSIS — U071 COVID-19: Secondary | ICD-10-CM | POA: Diagnosis not present

## 2020-06-24 DIAGNOSIS — F5101 Primary insomnia: Secondary | ICD-10-CM | POA: Diagnosis not present

## 2020-06-24 DIAGNOSIS — J1282 Pneumonia due to coronavirus disease 2019: Secondary | ICD-10-CM | POA: Diagnosis not present

## 2020-06-24 DIAGNOSIS — K219 Gastro-esophageal reflux disease without esophagitis: Secondary | ICD-10-CM | POA: Diagnosis not present

## 2020-06-24 DIAGNOSIS — J441 Chronic obstructive pulmonary disease with (acute) exacerbation: Secondary | ICD-10-CM | POA: Diagnosis not present

## 2020-06-25 ENCOUNTER — Other Ambulatory Visit: Payer: Self-pay

## 2020-06-25 ENCOUNTER — Encounter: Payer: Self-pay | Admitting: Vascular Surgery

## 2020-06-25 ENCOUNTER — Ambulatory Visit: Payer: PPO | Admitting: Vascular Surgery

## 2020-06-25 VITALS — BP 127/83 | HR 69 | Temp 97.2°F | Resp 14 | Ht 66.0 in | Wt 163.0 lb

## 2020-06-25 DIAGNOSIS — I871 Compression of vein: Secondary | ICD-10-CM

## 2020-06-25 NOTE — Progress Notes (Signed)
Patient ID: [name], female   DOB: 1940-10-20, 80 y.o.   MRN: [id number]  Reason for Consult: DVT (Left leg)   Referred by Deland Pretty, MD  Subjective:     HPI:  [name] is a 80 y.o. female recently admitted with COVID-19.  She has a previous history of spontaneous retroperitoneal bleed followed by extensive left lower extremity DVT followed by filter placement with difficult removal followed by stenting of her left common, external iliac vein and, left common femoral vein.  More recently she had left groin pain.  She now thinks this was a pulled muscle.  It has completely resolved.  She remains on aspirin no anticoagulation.  She has no swelling of her left lower extremity.  She is at her usual level of activity at this time.  Past Medical History:  Diagnosis Date  . Allergic rhinitis   . Anemia due to GI blood loss   . Asthma   . Chronic airway obstruction, not elsewhere classified   . Depressive disorder, not elsewhere classified   . DVT (deep venous thrombosis) (HCC)    LLE DVT 01/13/19  . Esophageal reflux   . Essential hypertension 08/27/2019  . PE (pulmonary thromboembolism) ([place]) 01/29/2019  . Pneumonia   . Wears partial dentures    Family History  Problem Relation Age of Onset  . Diabetes Mother   . Coronary artery disease Mother   . Emphysema Father   . Coronary artery disease Brother    Past Surgical History:  Procedure Laterality Date  . ABDOMINAL HYSTERECTOMY    . ABDOMINAL HYSTERECTOMY    . CATARACT EXTRACTION W/ INTRAOCULAR LENS  IMPLANT, BILATERAL    . COLONOSCOPY    . COLONOSCOPY WITH PROPOFOL N/A 08/20/2019   Procedure: COLONOSCOPY WITH PROPOFOL;  Surgeon: Ronald Lobo, MD;  Location: [facility];  Service: Endoscopy;  Laterality: N/A;  . CYSTOSCOPY    . DILATION AND CURETTAGE OF UTERUS    . ESOPHAGOGASTRODUODENOSCOPY (EGD) WITH PROPOFOL N/A 08/20/2019   Procedure: ESOPHAGOGASTRODUODENOSCOPY (EGD) WITH PROPOFOL;  Surgeon: Ronald Lobo, MD;  Location: [facility];  Service: Endoscopy;  Laterality: N/A;  . HEMOSTASIS CONTROL  08/20/2019   Procedure: HEMOSTASIS CONTROL;  Surgeon: Ronald Lobo, MD;  Location: [facility] ENDOSCOPY;  Service: Endoscopy;;  . INTRAVASCULAR ULTRASOUND/IVUS Left 05/26/2019   Procedure: Intravascular Ultrasound/IVUS;  Surgeon: Waynetta Sandy, MD;  Location: [place] [facility];  Service: Cardiovascular;  Laterality: Left;  . IVC FILTER REMOVAL N/A 03/24/2019   Procedure: IVC FILTER REMOVAL;  Surgeon: Waynetta Sandy, MD;  Location: [place] [facility];  Service: Cardiovascular;  Laterality: N/A;  . LOWER EXTREMITY VENOGRAPHY Left 05/26/2019   Procedure: LOWER EXTREMITY VENOGRAPHY;  Surgeon: Waynetta Sandy, MD;  Location: [place] [facility];  Service: Cardiovascular;  Laterality: Left;  Marland Kitchen MULTIPLE TOOTH EXTRACTIONS    . NASAL SINUS SURGERY    . PERIPHERAL VASCULAR INTERVENTION Left 05/26/2019   Procedure: PERIPHERAL VASCULAR INTERVENTION;  Surgeon: Waynetta Sandy, MD;  Location: [place] [facility];  Service: Cardiovascular;  Laterality: Left;  lower extremity veins  . PERIPHERAL VASCULAR THROMBECTOMY  05/26/2019   Procedure: PERIPHERAL VASCULAR THROMBECTOMY;  Surgeon: Waynetta Sandy, MD;  Location: [place] [facility];  Service: Cardiovascular;;  . TUBAL LIGATION    . VENA CAVA FILTER PLACEMENT N/A 01/13/2019   Procedure: INSERTION VENA-CAVA FILTER;  Surgeon: Waynetta Sandy, MD;  Location: [facility];  Service: Vascular;  Laterality: N/A;    Short Social  History:  Social History   Tobacco Use  . Smoking status: Former Smoker    Packs/day: 0.25    Years: 40.00    Pack years: 10.00    Types: Cigarettes    Quit date: 05/27/2013    Years since quitting: 7.0  . Smokeless tobacco: Never Used  Substance Use Topics  . Alcohol use: No    Allergies  Allergen Reactions  . Pneumococcal Vaccines Shortness Of Breath and Other (See Comments)     Couldn't breathe   . Clarithromycin Swelling  . Estradiol Rash    Reaction to cream  . Sulfa Antibiotics Nausea And Vomiting and Swelling  . Penicillins Swelling    Tongue swelling Did it involve swelling of the face/tongue/throat, SOB, or low BP? Yes Did it involve sudden or severe rash/hives, skin peeling, or any reaction on the inside of your mouth or nose? Yes Did you need to seek medical attention at a hospital or doctor's office? Yes When did it last happen?childhood If all above answers are "NO", may proceed with cephalosporin use.   . Sulfonamide Derivatives Swelling    Current Outpatient Medications  Medication Sig Dispense Refill  . acetaminophen (TYLENOL) 500 MG tablet Take 500 mg by mouth every 6 (six) hours as needed for headache (pain).    Marland Kitchen albuterol (PROVENTIL) (2.5 MG/3ML) 0.083% nebulizer solution Take 3 mLs (2.5 mg total) by nebulization every 6 (six) hours as needed for wheezing or shortness of breath. 75 mL 12  . albuterol (VENTOLIN HFA) 108 (90 Base) MCG/ACT inhaler Inhale 1-2 puffs into the lungs every 6 (six) hours as needed for wheezing or shortness of breath.    Marland Kitchen aspirin EC 81 MG EC tablet Take 1 tablet (81 mg total) by mouth daily. 30 tablet 0  . cetirizine (ZYRTEC) 10 MG tablet Take 10 mg by mouth daily as needed for allergies.    . ferrous sulfate 325 (65 FE) MG tablet Take 325 mg by mouth daily as needed (weakness).    . Fluticasone-Salmeterol (ADVAIR) 250-50 MCG/DOSE AEPB INHALE 1 DOSE BY MOUTH TWICE DAILY RINSE  MOUTH  AFTER  USE (Patient taking differently: Inhale 1 puff into the lungs in the morning and at bedtime. RINSE MOUTH AFTER USE) 60 each 5  . LORazepam (ATIVAN) 1 MG tablet Take 1 mg by mouth at bedtime.    Marland Kitchen omeprazole (PRILOSEC) 20 MG capsule Take 20 mg by mouth daily.    . traZODone (DESYREL) 100 MG tablet Take 100 mg by mouth at bedtime.    . triamterene-hydrochlorothiazide (MAXZIDE-25) 37.5-25 MG tablet Take 0.5 tablets by mouth daily.      No current facility-administered medications for this visit.    Review of Systems  Constitutional:  Constitutional negative. HENT: HENT negative.  Eyes: Eyes negative.  Respiratory: Positive for shortness of breath.  GI: Gastrointestinal negative.  Musculoskeletal: Musculoskeletal negative.       Left groin pain (resolved) Skin: Skin negative.  Neurological: Neurological negative. Hematologic: Hematologic/lymphatic negative.  Psychiatric: Psychiatric negative.        Objective:  Objective   Vitals:   06/25/20 1546  BP: 127/83  Pulse: 69  Resp: 14  Temp: (!) 97.2 F (36.2 C)  TempSrc: Temporal  SpO2: 98%  Weight: 163 lb (73.9 kg)  Height: 5\' 6"  (1.676 m)   Body mass index is 26.31 kg/m.  Physical Exam HENT:     Head: Normocephalic.     Nose:     Comments: Wearing a mask Eyes:  Pupils: Pupils are equal, round, and reactive to light.  Cardiovascular:     Rate and Rhythm: Normal rate.  Pulmonary:     Effort: Pulmonary effort is normal.  Abdominal:     General: Abdomen is flat.     Palpations: Abdomen is soft.  Musculoskeletal:        General: No swelling. Normal range of motion.  Skin:    General: Skin is warm and dry.     Capillary Refill: Capillary refill takes less than 2 seconds.  Neurological:     General: No focal deficit present.     Mental Status: She is alert.  Psychiatric:        Mood and Affect: Mood normal.        Behavior: Behavior normal.        Thought Content: Thought content normal.        Judgment: Judgment normal.     Data: No studies     Assessment/Plan:     80 year old female status post left lower extremity DVT found to have May Thurner syndrome status post stenting.  Previous history of retroperitoneal hematoma with IVC filter placement now status post removal.  Recently admitted with COVID.  She had left groin pain she thinks secondary to changing a light bulb has now resolved.  No swelling associated.  She will  continue aspirin.  She will keep her regular scheduled follow-up with IVC iliac duplex.     Waynetta Sandy MD [facility]

## 2020-06-27 DIAGNOSIS — J449 Chronic obstructive pulmonary disease, unspecified: Secondary | ICD-10-CM | POA: Diagnosis not present

## 2020-07-22 DIAGNOSIS — J189 Pneumonia, unspecified organism: Secondary | ICD-10-CM | POA: Diagnosis not present

## 2020-07-22 DIAGNOSIS — J9811 Atelectasis: Secondary | ICD-10-CM | POA: Diagnosis not present

## 2020-07-22 DIAGNOSIS — J1282 Pneumonia due to coronavirus disease 2019: Secondary | ICD-10-CM | POA: Diagnosis not present

## 2020-07-26 DIAGNOSIS — J449 Chronic obstructive pulmonary disease, unspecified: Secondary | ICD-10-CM | POA: Diagnosis not present

## 2020-07-28 DIAGNOSIS — J1282 Pneumonia due to coronavirus disease 2019: Secondary | ICD-10-CM | POA: Diagnosis not present

## 2020-07-28 DIAGNOSIS — F329 Major depressive disorder, single episode, unspecified: Secondary | ICD-10-CM | POA: Diagnosis not present

## 2020-07-28 DIAGNOSIS — U071 COVID-19: Secondary | ICD-10-CM | POA: Diagnosis not present

## 2020-08-12 DIAGNOSIS — M65341 Trigger finger, right ring finger: Secondary | ICD-10-CM | POA: Diagnosis not present

## 2020-08-12 DIAGNOSIS — M2021 Hallux rigidus, right foot: Secondary | ICD-10-CM | POA: Diagnosis not present

## 2020-08-12 DIAGNOSIS — M7150 Other bursitis, not elsewhere classified, unspecified site: Secondary | ICD-10-CM | POA: Diagnosis not present

## 2020-08-16 DIAGNOSIS — H43812 Vitreous degeneration, left eye: Secondary | ICD-10-CM | POA: Diagnosis not present

## 2020-08-23 DIAGNOSIS — F329 Major depressive disorder, single episode, unspecified: Secondary | ICD-10-CM | POA: Diagnosis not present

## 2020-08-23 DIAGNOSIS — M65341 Trigger finger, right ring finger: Secondary | ICD-10-CM | POA: Diagnosis not present

## 2020-08-25 DIAGNOSIS — J449 Chronic obstructive pulmonary disease, unspecified: Secondary | ICD-10-CM | POA: Diagnosis not present

## 2020-09-16 DIAGNOSIS — F329 Major depressive disorder, single episode, unspecified: Secondary | ICD-10-CM | POA: Diagnosis not present

## 2020-09-16 DIAGNOSIS — N390 Urinary tract infection, site not specified: Secondary | ICD-10-CM | POA: Diagnosis not present

## 2020-09-20 NOTE — Telephone Encounter (Signed)
Called and spoke with patient to make sure she had what she needed for her surgical clearance since she did not have the appointment with [name] in January as previously scheduled.  She verified she already had her procedure.  Verified her upcoming appointment with [name].  Nothing further needed.

## 2020-09-25 DIAGNOSIS — J449 Chronic obstructive pulmonary disease, unspecified: Secondary | ICD-10-CM | POA: Diagnosis not present

## 2020-09-28 NOTE — Progress Notes (Signed)
Patient ID: [name], female    DOB: 1940-11-30, 80 y.o.   MRN: [id number]  HPI female former smoker followed for COPD, allergic rhinitis, complicated by GERD, chronic back pain Office Spirometry 12/16/13- Severe obstructive airways disease, FVC 2.13/ 65%, FEV1 1.08/ 44%, FEV1/FVC 0.51, FEF25-75% 0.47/ 22%. History of intense reaction shortly after receiving pneumonia vaccine, leading to hospitalization. She is considered "allergic" and will not take pneumonia vaccine Office Spirometry 06/22/2016-severe obstructive airways disease. FVC 2.44/78%, FEV1 1.04/44%, ratio 0.43, FEF 25-75% 0.39/21%  ----------------------------------------------------------------    49/97- 80 year old female former smoker followed for COPD, allergic rhinitis, complicated by GERD, chronic back pain, PE 01/28/2019/  DVT(L)/ Eliquis, Hosp April- lower GI bleed ED in June for abdominal pain/ diarrhea Neb albuterol, albuterol hfa, Advair 250, Spiriva 2.5 Respimat,  Covid vax- 2 Phizer Flu vax- had -----pt states wheezing and sob on exertion Notes recent wheezing and dyspnea on exertion. Wants "big shot"- steroid. Little sputum, no CP.  Says Advair is ok but asks about a stronger rescue inhaler.  Had PE last year, then had GIB on Eliquis. Hgb in June was 57.  09/29/20- 80 year old female former smoker followed for COPD, Chronic Hypoxic Resp Failure,  allergic rhinitis, complicated by GERD, chronic back pain, PE 01/28/2019/  DVT(L)/, lower GI bleed> dc'd Eliquis, Covid pneumonia Jan 2022,  -Neb albuterol, albuterol hfa, Advair 250, Spiriva 2.5 Respimat,  O2 2-3L/ [facility] Covid vax-2 Phizer Hosp f/u- 1/13-1/15/22- Covid pneumonia -----C/o weakness, sob on exertion same, pnd, cough-yellow x 2-3 wks,  Not using O2 a lot in daytime.  Had double pneumonia with Covid infection - residual weakness. Now in last 2weeks chesst tightness blamed on "pollen" for which she asks depo inj.  She gets samples of Spiriva. CXR  06/10/20-  IMPRESSION: Persistent hazy bilateral airspace opacities, greatest in the right mid lung zone, somewhat improved from recent prior study which may be in part due to technique.   ROS-see HPI    + = positive Constitutional:   No-   weight loss, night sweats, fevers, chills, fatigue, lassitude. HEENT:   No-  headaches, difficulty swallowing, tooth/dental problems, sore throat,       No- sneezing, itching, ear ache,nasal congestion, +post nasal drip,  CV:  No-   chest pain, orthopnea, PND, swelling in lower extremities, anasarca, dizziness, palpitations Resp: + shortness of breath with exertion or at rest.              No-   productive cough,  + non-productive cough,  No- coughing up of blood.              No-   change in color of mucus.  +occasional mild wheezing.   Skin: No-   rash or lesions. GI:  No-   heartburn, indigestion, abdominal pain, nausea, vomiting,  GU:  MS:  No-   joint pain or swelling. + Acute/chronic back pain Neuro-     nothing unusual Psych:  No- change in mood or affect. No depression or anxiety.  No memory loss.  OBJ- Physical Exam General- Alert, Oriented, Affect-appropriate, Distress- none acute Skin- rash-none, lesions- none, excoriation- none Lymphadenopathy- none Head- atraumatic            Eyes- Gross vision intact, PERRLA, conjunctivae and secretions clear            Ears- Hearing, canals-normal            Nose- Clear, no-Septal dev, mucus, polyps, erosion, perforation  Throat- Mallampati II , mucosa clear , drainage- none, tonsils- atrophic Neck- flexible , trachea midline, no stridor , thyroid nl, carotid no bruit Chest - symmetrical excursion , unlabored           Heart/CV- RRR , no murmur , no gallop  , no rub, nl s1 s2                           - JVD- none , edema- none, stasis changes- none, varices- none           Lung-  +distant, , cough- none, wheeze-none , dullness-none, rub- none           Chest wall-  Abd-  Br/ Gen/  Rectal- Not done, not indicated Extrem- cyanosis- none, clubbing, none, atrophy- none, strength- nl Neuro- +slight droop L eye.

## 2020-09-29 ENCOUNTER — Other Ambulatory Visit: Payer: Self-pay

## 2020-09-29 ENCOUNTER — Ambulatory Visit: Payer: PPO | Admitting: Internal Medicine

## 2020-09-29 ENCOUNTER — Ambulatory Visit (INDEPENDENT_AMBULATORY_CARE_PROVIDER_SITE_OTHER): Payer: PPO

## 2020-09-29 ENCOUNTER — Encounter: Payer: Self-pay | Admitting: Internal Medicine

## 2020-09-29 VITALS — BP 128/76 | HR 84 | Temp 97.9°F | Ht 66.0 in | Wt 167.2 lb

## 2020-09-29 DIAGNOSIS — J441 Chronic obstructive pulmonary disease with (acute) exacerbation: Secondary | ICD-10-CM | POA: Diagnosis not present

## 2020-09-29 DIAGNOSIS — J449 Chronic obstructive pulmonary disease, unspecified: Secondary | ICD-10-CM | POA: Diagnosis not present

## 2020-09-29 DIAGNOSIS — U071 COVID-19: Secondary | ICD-10-CM | POA: Diagnosis not present

## 2020-09-29 DIAGNOSIS — J9600 Acute respiratory failure, unspecified whether with hypoxia or hypercapnia: Secondary | ICD-10-CM | POA: Diagnosis not present

## 2020-09-29 DIAGNOSIS — J189 Pneumonia, unspecified organism: Secondary | ICD-10-CM | POA: Diagnosis not present

## 2020-09-29 MED ORDER — PREDNISONE 10 MG PO TABS: ORAL_TABLET | ORAL | 0 refills | Status: DC

## 2020-09-29 MED ORDER — FLUTICASONE-SALMETEROL 250-50 MCG/ACT IN AEPB: INHALATION_SPRAY | RESPIRATORY_TRACT | 4 refills | Status: DC

## 2020-09-29 MED ORDER — METHYLPREDNISOLONE ACETATE 80 MG/ML IJ SUSP
80.0000 mg | Freq: Once | INTRAMUSCULAR | Status: AC
  Administered 2020-09-29: 80 mg via INTRAMUSCULAR

## 2020-09-29 NOTE — Patient Instructions (Signed)
Scripts sent for Advair and prednisone taper  Order- depo 80       Dx COPD exacerbation  Order- CXR    DX  COPD mixed type, hx Covid pneumonia  Please call if we can help. Hope you have a great trip to [facility]

## 2020-10-22 DIAGNOSIS — D509 Iron deficiency anemia, unspecified: Secondary | ICD-10-CM | POA: Diagnosis not present

## 2020-10-22 DIAGNOSIS — E7800 Pure hypercholesterolemia, unspecified: Secondary | ICD-10-CM | POA: Diagnosis not present

## 2020-10-25 DIAGNOSIS — J449 Chronic obstructive pulmonary disease, unspecified: Secondary | ICD-10-CM | POA: Diagnosis not present

## 2020-10-27 DIAGNOSIS — E7800 Pure hypercholesterolemia, unspecified: Secondary | ICD-10-CM | POA: Diagnosis not present

## 2020-10-27 DIAGNOSIS — F329 Major depressive disorder, single episode, unspecified: Secondary | ICD-10-CM | POA: Diagnosis not present

## 2020-10-27 DIAGNOSIS — D751 Secondary polycythemia: Secondary | ICD-10-CM | POA: Diagnosis not present

## 2020-10-27 DIAGNOSIS — D509 Iron deficiency anemia, unspecified: Secondary | ICD-10-CM | POA: Diagnosis not present

## 2020-10-27 DIAGNOSIS — I10 Essential (primary) hypertension: Secondary | ICD-10-CM | POA: Diagnosis not present

## 2020-10-27 DIAGNOSIS — K219 Gastro-esophageal reflux disease without esophagitis: Secondary | ICD-10-CM | POA: Diagnosis not present

## 2020-11-11 DIAGNOSIS — D509 Iron deficiency anemia, unspecified: Secondary | ICD-10-CM | POA: Diagnosis not present

## 2020-11-16 DIAGNOSIS — H43812 Vitreous degeneration, left eye: Secondary | ICD-10-CM | POA: Diagnosis not present

## 2020-11-25 DIAGNOSIS — J449 Chronic obstructive pulmonary disease, unspecified: Secondary | ICD-10-CM | POA: Diagnosis not present

## 2020-12-25 DIAGNOSIS — J449 Chronic obstructive pulmonary disease, unspecified: Secondary | ICD-10-CM | POA: Diagnosis not present

## 2021-01-08 ENCOUNTER — Encounter: Payer: Self-pay | Admitting: Internal Medicine

## 2021-01-08 NOTE — Assessment & Plan Note (Signed)
This is separate from the Covid pneumonia Plan- refill Advair, CXR, depo 80, prednisone taper to carry with her when she goes to [facility]

## 2021-01-08 NOTE — Assessment & Plan Note (Signed)
Still needing oxygen for sleep, but no longer needing it during day.

## 2021-01-14 ENCOUNTER — Emergency Department (HOSPITAL_BASED_OUTPATIENT_CLINIC_OR_DEPARTMENT_OTHER): Payer: PPO

## 2021-01-14 ENCOUNTER — Other Ambulatory Visit: Payer: Self-pay

## 2021-01-14 ENCOUNTER — Encounter (HOSPITAL_BASED_OUTPATIENT_CLINIC_OR_DEPARTMENT_OTHER): Payer: Self-pay | Admitting: *Deleted

## 2021-01-14 ENCOUNTER — Emergency Department (HOSPITAL_BASED_OUTPATIENT_CLINIC_OR_DEPARTMENT_OTHER)
Admission: EM | Admit: 2021-01-14 | Discharge: 2021-01-14 | Disposition: A | Discharge status: Home / Self Care | Payer: PPO | Attending: Emergency Medicine | Admitting: Emergency Medicine

## 2021-01-14 ENCOUNTER — Emergency Department (HOSPITAL_BASED_OUTPATIENT_CLINIC_OR_DEPARTMENT_OTHER): Payer: PPO | Admitting: Radiology

## 2021-01-14 ENCOUNTER — Other Ambulatory Visit (HOSPITAL_BASED_OUTPATIENT_CLINIC_OR_DEPARTMENT_OTHER): Payer: Self-pay

## 2021-01-14 DIAGNOSIS — Z7982 Long term (current) use of aspirin: Secondary | ICD-10-CM | POA: Diagnosis not present

## 2021-01-14 DIAGNOSIS — R0602 Shortness of breath: Secondary | ICD-10-CM | POA: Diagnosis not present

## 2021-01-14 DIAGNOSIS — Z8616 Personal history of COVID-19: Secondary | ICD-10-CM | POA: Diagnosis not present

## 2021-01-14 DIAGNOSIS — Z7951 Long term (current) use of inhaled steroids: Secondary | ICD-10-CM | POA: Diagnosis not present

## 2021-01-14 DIAGNOSIS — I10 Essential (primary) hypertension: Secondary | ICD-10-CM | POA: Diagnosis not present

## 2021-01-14 DIAGNOSIS — I82412 Acute embolism and thrombosis of left femoral vein: Secondary | ICD-10-CM | POA: Insufficient documentation

## 2021-01-14 DIAGNOSIS — J45909 Unspecified asthma, uncomplicated: Secondary | ICD-10-CM | POA: Diagnosis not present

## 2021-01-14 DIAGNOSIS — I82432 Acute embolism and thrombosis of left popliteal vein: Secondary | ICD-10-CM | POA: Diagnosis not present

## 2021-01-14 DIAGNOSIS — Z79899 Other long term (current) drug therapy: Secondary | ICD-10-CM | POA: Diagnosis not present

## 2021-01-14 DIAGNOSIS — J441 Chronic obstructive pulmonary disease with (acute) exacerbation: Secondary | ICD-10-CM | POA: Insufficient documentation

## 2021-01-14 DIAGNOSIS — Z87891 Personal history of nicotine dependence: Secondary | ICD-10-CM | POA: Insufficient documentation

## 2021-01-14 DIAGNOSIS — Z20822 Contact with and (suspected) exposure to covid-19: Secondary | ICD-10-CM | POA: Diagnosis not present

## 2021-01-14 LAB — BASIC METABOLIC PANEL
Anion gap: 10 (ref 5–15)
BUN: 19 mg/dL (ref 8–23)
CO2: 27 mmol/L (ref 22–32)
Calcium: 8.7 mg/dL — ABNORMAL LOW (ref 8.9–10.3)
Chloride: 105 mmol/L (ref 98–111)
Creatinine, Ser: 1.02 mg/dL — ABNORMAL HIGH (ref 0.44–1.00)
GFR, Estimated: 57 mL/min — ABNORMAL LOW (ref >60)
Glucose, Bld: 98 mg/dL (ref 70–99)
Potassium: 3.7 mmol/L (ref 3.5–5.1)
Sodium: 142 mmol/L (ref 135–145)

## 2021-01-14 LAB — CBC WITH DIFFERENTIAL/PLATELET
Abs Immature Granulocytes: 0.02 10*3/uL (ref 0.00–0.07)
Basophils Absolute: 0.1 10*3/uL (ref 0.0–0.1)
Basophils Relative: 1 %
Eosinophils Absolute: 0.1 10*3/uL (ref 0.0–0.5)
Eosinophils Relative: 1 %
HCT: 48.8 % — ABNORMAL HIGH (ref 36.0–46.0)
Hemoglobin: 15.7 g/dL — ABNORMAL HIGH (ref 12.0–15.0)
Immature Granulocytes: 0 %
Lymphocytes Relative: 24 %
Lymphs Abs: 1.6 10*3/uL (ref 0.7–4.0)
MCH: 29.7 pg (ref 26.0–34.0)
MCHC: 32.2 g/dL (ref 30.0–36.0)
MCV: 92.4 fL (ref 80.0–100.0)
Monocytes Absolute: 0.7 10*3/uL (ref 0.1–1.0)
Monocytes Relative: 10 %
Neutro Abs: 4.4 10*3/uL (ref 1.7–7.7)
Neutrophils Relative %: 64 %
Platelets: 344 10*3/uL (ref 150–400)
RBC: 5.28 MIL/uL — ABNORMAL HIGH (ref 3.87–5.11)
RDW: 13.3 % (ref 11.5–15.5)
WBC: 6.9 10*3/uL (ref 4.0–10.5)
nRBC: 0 % (ref 0.0–0.2)

## 2021-01-14 LAB — RESP PANEL BY RT-PCR (FLU A&B, COVID) ARPGX2
Influenza A by PCR: NEGATIVE
Influenza B by PCR: NEGATIVE
SARS Coronavirus 2 by RT PCR: NEGATIVE

## 2021-01-14 LAB — BRAIN NATRIURETIC PEPTIDE: B Natriuretic Peptide: 31.5 pg/mL (ref 0.0–100.0)

## 2021-01-14 MED ORDER — METHYLPREDNISOLONE SODIUM SUCC 125 MG IJ SOLR
125.0000 mg | Freq: Once | INTRAMUSCULAR | Status: AC
  Administered 2021-01-14: 125 mg via INTRAVENOUS
  Filled 2021-01-14: qty 2

## 2021-01-14 MED ORDER — PREDNISONE 10 MG PO TABS
ORAL_TABLET | Freq: Every day | ORAL | 0 refills | Status: DC
  Filled 2021-01-14: qty 42, 12d supply, fill #0

## 2021-01-14 MED ORDER — APIXABAN (ELIQUIS) EDUCATION KIT FOR DVT/PE PATIENTS: PACK | Freq: Once | Status: DC

## 2021-01-14 MED ORDER — APIXABAN (ELIQUIS) VTE STARTER PACK (10MG AND 5MG)
ORAL_TABLET | ORAL | 0 refills | Status: DC
  Filled 2021-01-14: qty 74, 30d supply, fill #0

## 2021-01-14 MED ORDER — APIXABAN 2.5 MG PO TABS
10.0000 mg | ORAL_TABLET | Freq: Once | ORAL | Status: AC
  Administered 2021-01-14: 10 mg via ORAL
  Filled 2021-01-14: qty 4

## 2021-01-14 MED ORDER — IOHEXOL 350 MG/ML SOLN
75.0000 mL | Freq: Once | INTRAVENOUS | Status: AC | PRN
  Administered 2021-01-14: 75 mL via INTRAVENOUS

## 2021-01-14 MED ORDER — ALBUTEROL SULFATE HFA 108 (90 BASE) MCG/ACT IN AERS
2.0000 | INHALATION_SPRAY | RESPIRATORY_TRACT | Status: DC | PRN
  Administered 2021-01-14: 2 via RESPIRATORY_TRACT
  Filled 2021-01-14: qty 6.7

## 2021-01-14 NOTE — ED Notes (Signed)
US bedside

## 2021-01-14 NOTE — Discharge Instructions (Addendum)

## 2021-01-14 NOTE — ED Provider Notes (Signed)
[place] EMERGENCY DEPT Provider Note   CSN: [id number] Arrival date & time: 01/14/21  [id number]     History Chief Complaint  Patient presents with   Shortness of Breath    [name] is a 80 y.o. female.  Pt presents to the ED today with sob.  She said it has been going on for a few weeks.  Pt does have a hx of COPD and has been using her inhalers and they have been helping.  Pt has noticed some swelling in her LLE.  She has a hx of DVT in that leg.  She is not currently on blood thinners.        Past Medical History:  Diagnosis Date   Allergic rhinitis    Anemia due to GI blood loss    Asthma    Chronic airway obstruction, not elsewhere classified    Depressive disorder, not elsewhere classified    DVT (deep venous thrombosis) (HCC)    LLE DVT 01/13/19   Esophageal reflux    Essential hypertension 08/27/2019   PE (pulmonary thromboembolism) ([place]) 01/29/2019   Pneumonia    Wears partial dentures     Patient Active Problem List   Diagnosis Date Noted   Acute respiratory failure due to COVID-19 ([place]) 06/11/2020   Dyslipidemia 06/11/2020   Acute lower GI bleeding 08/27/2019   History of pulmonary embolism 08/27/2019   Hyperglycemia 08/27/2019   Essential hypertension 08/27/2019   DVT, recurrent, lower extremity, chronic, left 01/28/2019   Acute pulmonary embolism with acute cor pulmonale ([place]) 01/28/2019   Hypokalemia 01/28/2019   Retroperitoneal bleed 01/13/2019   CAP (community acquired pneumonia) 05/27/2013   SOB (shortness of breath) 05/27/2013   Myalgia 05/27/2013   COPD with acute exacerbation ([place]) 05/27/2013   Acute respiratory failure with hypoxia ([place]) 05/27/2013   Vertebral compression fracture ([place]) 09/15/2010   SCIATICA 12/16/2007   COPD mixed type ([place]) 07/17/2007   ALLERGIC RHINITIS 07/16/2007   BRONCHITIS 07/16/2007   ASTHMA 07/16/2007   Esophageal reflux 07/16/2007    Past Surgical History:  Procedure Laterality Date    ABDOMINAL HYSTERECTOMY     ABDOMINAL HYSTERECTOMY     CATARACT EXTRACTION W/ INTRAOCULAR LENS  IMPLANT, BILATERAL     COLONOSCOPY     COLONOSCOPY WITH PROPOFOL N/A 08/20/2019   Procedure: COLONOSCOPY WITH PROPOFOL;  Surgeon: Ronald Lobo, MD;  Location: [facility] ENDOSCOPY;  Service: Endoscopy;  Laterality: N/A;   CYSTOSCOPY     DILATION AND CURETTAGE OF UTERUS     ESOPHAGOGASTRODUODENOSCOPY (EGD) WITH PROPOFOL N/A 08/20/2019   Procedure: ESOPHAGOGASTRODUODENOSCOPY (EGD) WITH PROPOFOL;  Surgeon: Ronald Lobo, MD;  Location: [place];  Service: Endoscopy;  Laterality: N/A;   HEMOSTASIS CONTROL  08/20/2019   Procedure: HEMOSTASIS CONTROL;  Surgeon: Ronald Lobo, MD;  Location: [facility] ENDOSCOPY;  Service: Endoscopy;;   INTRAVASCULAR ULTRASOUND/IVUS Left 05/26/2019   Procedure: Intravascular Ultrasound/IVUS;  Surgeon: Waynetta Sandy, MD;  Location: [place] CV LAB;  Service: Cardiovascular;  Laterality: Left;   IVC FILTER REMOVAL N/A 03/24/2019   Procedure: IVC FILTER REMOVAL;  Surgeon: Waynetta Sandy, MD;  Location: [place] CV LAB;  Service: Cardiovascular;  Laterality: N/A;   LOWER EXTREMITY VENOGRAPHY Left 05/26/2019   Procedure: LOWER EXTREMITY VENOGRAPHY;  Surgeon: Waynetta Sandy, MD;  Location: [place] CV LAB;  Service: Cardiovascular;  Laterality: Left;   MULTIPLE TOOTH EXTRACTIONS     NASAL SINUS SURGERY     PERIPHERAL VASCULAR INTERVENTION Left 05/26/2019   Procedure: PERIPHERAL VASCULAR  INTERVENTION;  Surgeon: Waynetta Sandy, MD;  Location: [place] CV LAB;  Service: Cardiovascular;  Laterality: Left;  lower extremity veins   PERIPHERAL VASCULAR THROMBECTOMY  05/26/2019   Procedure: PERIPHERAL VASCULAR THROMBECTOMY;  Surgeon: Waynetta Sandy, MD;  Location: [place] CV LAB;  Service: Cardiovascular;;   TUBAL LIGATION     VENA CAVA FILTER PLACEMENT N/A 01/13/2019   Procedure: INSERTION VENA-CAVA FILTER;  Surgeon:  Waynetta Sandy, MD;  Location: [place];  Service: Vascular;  Laterality: N/A;     OB History   No obstetric history on file.     Family History  Problem Relation Age of Onset   Diabetes Mother    Coronary artery disease Mother    Emphysema Father    Coronary artery disease Brother     Social History   Tobacco Use   Smoking status: Former    Packs/day: 0.25    Years: 40.00    Pack years: 10.00    Types: Cigarettes    Quit date: 05/27/2013    Years since quitting: 7.6   Smokeless tobacco: Never  Vaping Use   Vaping Use: Never used  Substance Use Topics   Alcohol use: No   Drug use: No    Home Medications Prior to Admission medications   Medication Sig Start Date End Date Taking? Authorizing Provider  albuterol (PROVENTIL) (2.5 MG/3ML) 0.083% nebulizer solution Take 3 mLs (2.5 mg total) by nebulization every 6 (six) hours as needed for wheezing or shortness of breath. 06/30/19  Yes Young, Tarri Fuller D, MD  APIXABAN Arne [place]) VTE STARTER PACK (10MG AND 5MG) Take as directed on package: start with two-23m tablets twice daily for 7 days. On day 8, switch to one-526mtablet twice daily. 01/14/21  Yes HaIsla PenceMD  aspirin EC 81 MG EC tablet Take 1 tablet (81 mg total) by mouth daily. 01/17/19  Yes KrBonnielee HaffMD  cetirizine (ZYRTEC) 10 MG tablet Take 10 mg by mouth daily as needed for allergies.   Yes [provider]  fluticasone-salmeterol (ADVAIR) 250-50 MCG/ACT AEPB Inhale 1 puff then rinse mouth, twice daily 09/29/20  Yes Young, Clinton D, MD  LORazepam (ATIVAN) 1 MG tablet Take 1 mg by mouth at bedtime.   Yes [provider]  omeprazole (PRILOSEC) 20 MG capsule Take 20 mg by mouth daily. 03/26/20  Yes [provider]  predniSONE (DELTASONE) 10 MG tablet Take 6 tabs daily for 2 days, 5 tabs daily for 2 days, 4 tabs daily for 2 days, 3 tabs daily for 2 days, 2 tabs daily for 2 days, then 1 tablet daily for 2 days 01/14/21  Yes HaIsla PenceMD  traZODone (DESYREL) 100 MG tablet Take 100 mg by mouth at bedtime.   Yes [provider]  triamterene-hydrochlorothiazide (MAXZIDE-25) 37.5-25 MG tablet Take 0.5 tablets by mouth daily. 02/06/19  Yes [provider]  acetaminophen (TYLENOL) 500 MG tablet Take 500 mg by mouth every 6 (six) hours as needed for headache (pain).    [provider]  albuterol (VENTOLIN HFA) 108 (90 Base) MCG/ACT inhaler Inhale 1-2 puffs into the lungs every 6 (six) hours as needed for wheezing or shortness of breath.    [provider]  ferrous sulfate 325 (65 FE) MG tablet Take 325 mg by mouth daily as needed (weakness).    [provider]    Allergies    Pneumococcal vaccines, Clarithromycin, Estradiol, Sulfa antibiotics, Penicillins, and Sulfonamide derivatives  Review of Systems  Review of Systems  Respiratory:  Positive for shortness of breath.   Cardiovascular:  Positive for leg swelling.  All other systems reviewed and are negative.  Physical Exam Updated Vital Signs BP 123/83 (BP Location: Left Arm)   Pulse 63   Temp 98.7 F (37.1 C) (Oral)   Resp 20   Ht _0  (1.676 m)   Wt 74.8 kg   SpO2 92%   BMI 26.63 kg/m   Physical Exam Vitals and nursing note reviewed.  Constitutional:      Appearance: She is well-developed.  HENT:     Head: Normocephalic and atraumatic.     Mouth/Throat:     Mouth: Mucous membranes are moist.     Pharynx: Oropharynx is clear.  Eyes:     Extraocular Movements: Extraocular movements intact.     Pupils: Pupils are equal, round, and reactive to light.  Cardiovascular:     Rate and Rhythm: Normal rate and regular rhythm.  Pulmonary:     Effort: Pulmonary effort is normal.     Breath sounds: Normal breath sounds.  Abdominal:     General: Bowel sounds are normal.     Palpations: Abdomen is soft.  Musculoskeletal:        General: Normal range of motion.     Cervical back: Normal range of motion and neck  supple.     Left lower leg: Edema present.  Skin:    General: Skin is warm.     Capillary Refill: Capillary refill takes less than 2 seconds.  Neurological:     General: No focal deficit present.     Mental Status: She is alert and oriented to person, place, and time.  Psychiatric:        Mood and Affect: Mood normal.        Behavior: Behavior normal.    ED Results / Procedures / Treatments   Labs (all labs ordered are listed, but only abnormal results are displayed) Labs Reviewed  BASIC METABOLIC PANEL - Abnormal; Notable for the following components:      Result Value   Creatinine, Ser 1.02 (*)    Calcium 8.7 (*)    GFR, Estimated 57 (*)    All other components within normal limits  CBC WITH DIFFERENTIAL/PLATELET - Abnormal; Notable for the following components:   RBC 5.28 (*)    Hemoglobin 15.7 (*)    HCT 48.8 (*)    All other components within normal limits  RESP PANEL BY RT-PCR (FLU A&B, COVID) [id number]  BRAIN NATRIURETIC PEPTIDE    EKG EKG Interpretation  Date/Time:  Friday January 14 2021 10:22:36 EDT Ventricular Rate:  62 PR Interval:  160 QRS Duration: 87 QT Interval:  458 QTC Calculation: 466 R Axis:   50 Text Interpretation: Sinus rhythm Low voltage, precordial leads Consider anterior infarct No significant change since last tracing Confirmed by Isla Pence [phone]) on 01/14/2021 11:53:24 AM  Radiology DG Chest 2 View  Result Date: 01/14/2021 CLINICAL DATA:  Shortness of breath EXAM: CHEST - 2 VIEW COMPARISON:  Chest radiograph 09/29/2020 FINDINGS: The cardiomediastinal silhouette is stable. Patchy opacities in the left base and lingula likely reflect subsegmental atelectasis and/or scarring. There is no other focal consolidation. There is no pulmonary edema. There is no pleural effusion or pneumothorax. There is no acute osseous abnormality. IMPRESSION: Stable chest with no radiographic evidence of acute cardiopulmonary process. Electronically Signed   By:  Valetta Mole M.D.   On: 01/14/2021 11:07   CT Angio  Chest PE W and/or Wo Contrast  Result Date: 01/14/2021 CLINICAL DATA:  Shortness of breath. EXAM: CT ANGIOGRAPHY CHEST WITH CONTRAST TECHNIQUE: Multidetector CT imaging of the chest was performed using the standard protocol during bolus administration of intravenous contrast. Multiplanar CT image reconstructions and MIPs were obtained to evaluate the vascular anatomy. CONTRAST:  65m OMNIPAQUE IOHEXOL 350 MG/ML SOLN COMPARISON:  January 28, 2019. FINDINGS: Cardiovascular: Satisfactory opacification of the pulmonary arteries to the segmental level. No evidence of pulmonary embolism. Normal heart size. No pericardial effusion. Atherosclerosis of thoracic aorta is noted without aneurysm formation. Moderate coronary artery calcifications are noted. Mediastinum/Nodes: No enlarged mediastinal, hilar, or axillary lymph nodes. Thyroid gland, trachea, and esophagus demonstrate no significant findings. Lungs/Pleura: No pneumothorax or pleural effusion is noted. Emphysematous disease is noted bilaterally. Mild right posterior basilar subsegmental atelectasis or scarring is noted. Stable right upper lobe subsegmental atelectasis or scarring is noted. Upper Abdomen: No acute abnormality. Musculoskeletal: No chest wall abnormality. No acute or significant osseous findings. Review of the MIP images confirms the above findings. IMPRESSION: No definite evidence of pulmonary embolus. Moderate coronary artery calcifications are noted. Stable right upper and lower lobe subsegmental atelectasis or scarring is noted. Aortic Atherosclerosis (ICD10-I70.0). Electronically Signed   By: JMarijo ConceptionM.D.   On: 01/14/2021 13:28   UK[place]Venous Img Lower Unilateral Left  Result Date: 01/14/2021 CLINICAL DATA:  Left lower extremity swelling and shortness of breath for 2 weeks EXAM: LEFT LOWER EXTREMITY VENOUS DOPPLER ULTRASOUND TECHNIQUE: Gray-scale sonography with graded compression,  as well as color Doppler and duplex ultrasound were performed to evaluate the lower extremity deep venous systems from the level of the common femoral vein and including the common femoral, femoral, profunda femoral, popliteal and calf veins including the posterior tibial, peroneal and gastrocnemius veins when visible. The superficial great saphenous vein was also interrogated. Spectral Doppler was utilized to evaluate flow at rest and with distal augmentation maneuvers in the common femoral, femoral and popliteal veins. COMPARISON:  12/31/2018 FINDINGS: Contralateral Common Femoral Vein: Respiratory phasicity is normal and symmetric with the symptomatic side. No evidence of thrombus. Normal compressibility. Common Femoral Vein: Anterior adherent crescent like hypoechoic thrombus which is nonocclusive. Vessel compressible. Phasic flow maintained. Saphenofemoral Junction: No evidence of thrombus. Normal compressibility and flow on color Doppler imaging. Profunda Femoral Vein: No evidence of thrombus. Normal compressibility and flow on color Doppler imaging. Femoral Vein: Overall small in caliber with mixed echogenicity thrombus. Vessel is noncompressible. Thrombus appears occlusive. No phasic flow appreciated. Popliteal Vein: Wall thickening with residual adherent crescent like nonocclusive thrombus. Vessel is partially compressible. Phasic flow noted. Calf Veins: No evidence of thrombus. Normal compressibility and flow on color Doppler imaging. Superficial Great Saphenous Vein: No evidence of thrombus. Normal compressibility. Other Findings:  None. IMPRESSION: Positive exam for DVT within the common femoral, femoral, and popliteal veins as above. DVT appears more subacute to chronic rather than acute. Electronically Signed   By: MJerilynn Mages  Shick M.D.   On: 01/14/2021 12:11    Procedures Procedures   Medications Ordered in ED Medications  albuterol (VENTOLIN HFA) 108 (90 Base) MCG/ACT inhaler 2 puff (2 puffs  Inhalation Given 01/14/21 1254)  apixaban (ELIQUIS) Education Kit for DVT/PE patients (has no administration in time range)  methylPREDNISolone sodium succinate (SOLU-MEDROL) 125 mg/2 mL injection 125 mg (has no administration in time range)  apixaban (ELIQUIS) tablet 10 mg (10 mg Oral Given 01/14/21 1232)  iohexol (OMNIPAQUE) 350 MG/ML injection 75 mL (75 mLs Intravenous  Contrast Given 01/14/21 1245)    ED Course  I have reviewed the triage vital signs and the nursing notes.  Pertinent labs & imaging results that were available during my care of the patient were reviewed by me and considered in my medical decision making (see chart for details).    MDM Rules/Calculators/A&P                           Pt does have a subacute to chronic DVT.  She is started back on Eliquis.  She understands the risks as she has been on it in the past.  SOB is likely due to COPD as CT did not show PE.  She is d/c with prednisone.  She is to return if worse.  F/u with pcp.  Final Clinical Impression(s) / ED Diagnoses Final diagnoses:  Deep vein thrombosis (DVT) of femoral vein of left lower extremity, unspecified chronicity ([place])  COPD exacerbation ([place])    Rx / DC Orders ED Discharge Orders          Ordered    APIXABAN (ELIQUIS) VTE STARTER PACK (10MG AND 5MG)        01/14/21 1239    predniSONE (DELTASONE) 10 MG tablet  Daily        01/14/21 1340             Isla Pence, MD 01/14/21 1356

## 2021-01-14 NOTE — ED Triage Notes (Signed)
Couple of weeks of Shob, pt has history of COPD

## 2021-01-17 ENCOUNTER — Other Ambulatory Visit (HOSPITAL_BASED_OUTPATIENT_CLINIC_OR_DEPARTMENT_OTHER): Payer: Self-pay

## 2021-01-18 DIAGNOSIS — Z7901 Long term (current) use of anticoagulants: Secondary | ICD-10-CM | POA: Diagnosis not present

## 2021-01-18 DIAGNOSIS — Z86711 Personal history of pulmonary embolism: Secondary | ICD-10-CM | POA: Diagnosis not present

## 2021-01-18 DIAGNOSIS — I82492 Acute embolism and thrombosis of other specified deep vein of left lower extremity: Secondary | ICD-10-CM | POA: Diagnosis not present

## 2021-01-18 DIAGNOSIS — J441 Chronic obstructive pulmonary disease with (acute) exacerbation: Secondary | ICD-10-CM | POA: Diagnosis not present

## 2021-01-19 ENCOUNTER — Other Ambulatory Visit: Payer: Self-pay

## 2021-01-19 DIAGNOSIS — I871 Compression of vein: Secondary | ICD-10-CM

## 2021-01-20 ENCOUNTER — Encounter (HOSPITAL_COMMUNITY): Payer: PPO

## 2021-01-25 DIAGNOSIS — J449 Chronic obstructive pulmonary disease, unspecified: Secondary | ICD-10-CM | POA: Diagnosis not present

## 2021-01-26 ENCOUNTER — Ambulatory Visit: Payer: PPO | Admitting: Vascular Surgery

## 2021-02-11 DIAGNOSIS — Z23 Encounter for immunization: Secondary | ICD-10-CM | POA: Diagnosis not present

## 2021-02-16 ENCOUNTER — Ambulatory Visit: Payer: PPO | Admitting: Vascular Surgery

## 2021-02-16 ENCOUNTER — Encounter: Payer: Self-pay | Admitting: Vascular Surgery

## 2021-02-16 ENCOUNTER — Other Ambulatory Visit: Payer: Self-pay

## 2021-02-16 ENCOUNTER — Ambulatory Visit (HOSPITAL_COMMUNITY)
Admission: RE | Admit: 2021-02-16 | Discharge: 2021-02-16 | Disposition: A | Discharge status: Home / Self Care | Payer: PPO | Source: Ambulatory Visit | Attending: Vascular Surgery | Admitting: Vascular Surgery

## 2021-02-16 VITALS — BP 131/83 | HR 64 | Temp 97.9°F | Resp 20 | Ht 66.0 in | Wt 168.0 lb

## 2021-02-16 DIAGNOSIS — I871 Compression of vein: Secondary | ICD-10-CM | POA: Diagnosis not present

## 2021-02-16 DIAGNOSIS — Z95828 Presence of other vascular implants and grafts: Secondary | ICD-10-CM

## 2021-02-16 NOTE — Progress Notes (Signed)
Patient ID: [name], female   DOB: December 17, 1940, 80 y.o.   MRN: [id number]  Reason for Consult: Follow-up   Referred by Deland Pretty, MD  Subjective:     HPI:  [name] is a 80 y.o. female history of extensive left lower extremity DVT that required stenting of her left common and external iliac veins and left common femoral vein.  She was noted to have femoral vein occlusion on her venogram.  Initially this all started with a retroperitoneal bleed which may have been compressing her veins.  Ultimately her left common and external neck veins were occluded.  She had a filter and this was then removed.  She had transition to aspirin only at our last visit.  More recently she had swelling in her left calf and shortness of breath she was diagnosed with a new DVT and started on Eliquis.  This was 1 month ago.  She now has significant bruising.  She is taking aspirin and Eliquis concomitantly.  She has no further swelling in her leg.  She states that she does have pain in both legs.  She does not have tissue loss or ulceration.  Past Medical History:  Diagnosis Date   Allergic rhinitis    Anemia due to GI blood loss    Asthma    Chronic airway obstruction, not elsewhere classified    Depressive disorder, not elsewhere classified    DVT (deep venous thrombosis) (HCC)    LLE DVT 01/13/19   Esophageal reflux    Essential hypertension 08/27/2019   PE (pulmonary thromboembolism) ([place]) 01/29/2019   Pneumonia    Wears partial dentures    Family History  Problem Relation Age of Onset   Diabetes Mother    Coronary artery disease Mother    Emphysema Father    Coronary artery disease Brother    Past Surgical History:  Procedure Laterality Date   ABDOMINAL HYSTERECTOMY     ABDOMINAL HYSTERECTOMY     CATARACT EXTRACTION W/ INTRAOCULAR LENS  IMPLANT, BILATERAL     COLONOSCOPY     COLONOSCOPY WITH PROPOFOL N/A 08/20/2019   Procedure: COLONOSCOPY WITH PROPOFOL;  Surgeon: Ronald Lobo,  MD;  Location: [facility] ENDOSCOPY;  Service: Endoscopy;  Laterality: N/A;   CYSTOSCOPY     DILATION AND CURETTAGE OF UTERUS     ESOPHAGOGASTRODUODENOSCOPY (EGD) WITH PROPOFOL N/A 08/20/2019   Procedure: ESOPHAGOGASTRODUODENOSCOPY (EGD) WITH PROPOFOL;  Surgeon: Ronald Lobo, MD;  Location: [facility];  Service: Endoscopy;  Laterality: N/A;   HEMOSTASIS CONTROL  08/20/2019   Procedure: HEMOSTASIS CONTROL;  Surgeon: Ronald Lobo, MD;  Location: [facility] ENDOSCOPY;  Service: Endoscopy;;   INTRAVASCULAR ULTRASOUND/IVUS Left 05/26/2019   Procedure: Intravascular Ultrasound/IVUS;  Surgeon: Waynetta Sandy, MD;  Location: [facility];  Service: Cardiovascular;  Laterality: Left;   IVC FILTER REMOVAL N/A 03/24/2019   Procedure: IVC FILTER REMOVAL;  Surgeon: Waynetta Sandy, MD;  Location: [place] [facility];  Service: Cardiovascular;  Laterality: N/A;   LOWER EXTREMITY VENOGRAPHY Left 05/26/2019   Procedure: LOWER EXTREMITY VENOGRAPHY;  Surgeon: Waynetta Sandy, MD;  Location: [place] [facility];  Service: Cardiovascular;  Laterality: Left;   MULTIPLE TOOTH EXTRACTIONS     NASAL SINUS SURGERY     PERIPHERAL VASCULAR INTERVENTION Left 05/26/2019   Procedure: PERIPHERAL VASCULAR INTERVENTION;  Surgeon: Waynetta Sandy, MD;  Location: [place] [facility];  Service: Cardiovascular;  Laterality: Left;  lower extremity veins   PERIPHERAL VASCULAR THROMBECTOMY  05/26/2019  Procedure: PERIPHERAL VASCULAR THROMBECTOMY;  Surgeon: Waynetta Sandy, MD;  Location: [place] [facility];  Service: Cardiovascular;;   TUBAL LIGATION     VENA CAVA FILTER PLACEMENT N/A 01/13/2019   Procedure: INSERTION VENA-CAVA FILTER;  Surgeon: Waynetta Sandy, MD;  Location: [place];  Service: Vascular;  Laterality: N/A;    Short Social History:  Social History   Tobacco Use   Smoking status: Former    Packs/day: 0.25    Years: 40.00    Pack years: 10.00    Types:  Cigarettes    Quit date: 05/27/2013    Years since quitting: 7.7   Smokeless tobacco: Never  Substance Use Topics   Alcohol use: No    Allergies  Allergen Reactions   Pneumococcal Vaccines Shortness Of Breath and Other (See Comments)    Couldn't breathe    Clarithromycin Swelling   Estradiol Rash    Reaction to cream   Sulfa Antibiotics Nausea And Vomiting and Swelling   Penicillins Swelling    Tongue swelling Did it involve swelling of the face/tongue/throat, SOB, or low BP? Yes Did it involve sudden or severe rash/hives, skin peeling, or any reaction on the inside of your mouth or nose? Yes Did you need to seek medical attention at a hospital or doctor's office? Yes When did it last happen?  childhood If all above answers are "NO", may proceed with cephalosporin use.    Sulfonamide Derivatives Swelling    Current Outpatient Medications  Medication Sig Dispense Refill   acetaminophen (TYLENOL) 500 MG tablet Take 500 mg by mouth every 6 (six) hours as needed for headache (pain).     albuterol (PROVENTIL) (2.5 MG/3ML) 0.083% nebulizer solution Take 3 mLs (2.5 mg total) by nebulization every 6 (six) hours as needed for wheezing or shortness of breath. 75 mL 12   albuterol (VENTOLIN HFA) 108 (90 Base) MCG/ACT inhaler Inhale 1-2 puffs into the lungs every 6 (six) hours as needed for wheezing or shortness of breath.     APIXABAN (ELIQUIS) VTE STARTER PACK (10MG  AND 5MG ) Take as directed on package: start with two-5mg  tablets twice daily for 7 days. On day 8, switch to one-5mg  tablet twice daily. 74 tablet 0   aspirin EC 81 MG EC tablet Take 1 tablet (81 mg total) by mouth daily. 30 tablet 0   cetirizine (ZYRTEC) 10 MG tablet Take 10 mg by mouth daily as needed for allergies.     ferrous sulfate 325 (65 FE) MG tablet Take 325 mg by mouth daily as needed (weakness).     fluticasone-salmeterol (ADVAIR) 250-50 MCG/ACT AEPB Inhale 1 puff then rinse mouth, twice daily 180 each 4    LORazepam (ATIVAN) 1 MG tablet Take 1 mg by mouth at bedtime.     omeprazole (PRILOSEC) 20 MG capsule Take 20 mg by mouth daily.     predniSONE (DELTASONE) 10 MG tablet Take 6 tabs daily for 2 days, 5 tabs daily for 2 days, 4 tabs daily for 2 days, 3 tabs daily for 2 days, 2 tabs daily for 2 days, then 1 tablet daily for 2 days 42 tablet 0   traZODone (DESYREL) 100 MG tablet Take 100 mg by mouth at bedtime.     triamterene-hydrochlorothiazide (MAXZIDE-25) 37.5-25 MG tablet Take 0.5 tablets by mouth daily.     No current facility-administered medications for this visit.    Review of Systems  Constitutional:  Constitutional negative. HENT: HENT negative.  Eyes: Eyes negative.  Respiratory: Respiratory negative.  Cardiovascular: Positive for leg swelling.  GI: Gastrointestinal negative.  Musculoskeletal: Musculoskeletal negative.  Skin: Skin negative.  Neurological: Neurological negative. Hematologic: Positive for bruises/bleeds easily.       Objective:  Objective   Vitals:   02/16/21 0934  BP: 131/83  Pulse: 64  Resp: 20  Temp: 97.9 F (36.6 C)  SpO2: 91%  Weight: 168 lb (76.2 kg)  Height: 5\' 6"  (1.676 m)   Body mass index is 27.12 kg/m.  Physical Exam HENT:     Head: Normocephalic.     Nose:     Comments: Wearing a mask Eyes:     Pupils: Pupils are equal, round, and reactive to light.  Cardiovascular:     Rate and Rhythm: Normal rate.     Pulses: Normal pulses.  Pulmonary:     Effort: Pulmonary effort is normal.  Abdominal:     General: Abdomen is flat.     Palpations: Abdomen is soft. There is no mass.  Musculoskeletal:        General: Normal range of motion.     Cervical back: Normal range of motion.     Right lower leg: No edema.     Left lower leg: No edema.  Skin:    Capillary Refill: Capillary refill takes less than 2 seconds.     Comments: Ecchymosis bilateral upper and lower extremities  Neurological:     General: No focal deficit present.      Mental Status: She is alert.  Psychiatric:        Mood and Affect: Mood normal.        Behavior: Behavior normal.        Thought Content: Thought content normal.    Data: IVC/Iliac Findings:  +----------+------+--------+-----------------------+     IVC    PatentThrombus       Comments          +----------+------+--------+-----------------------+  IVC Prox  patent                                 +----------+------+--------+-----------------------+  IVC Mid   patent        decreased visualization  +----------+------+--------+-----------------------+  IVC Distalpatent                                 +----------+------+--------+-----------------------+      +---------------+---------+-----------+---------+-----------+--------------  ----+        CIV      RT-PatentRT-ThrombusLT-PatentLT-Thrombus     Comments        +---------------+---------+-----------+---------+-----------+--------------  ----+  Common Iliac                        patent                                   Prox                                                                         +---------------+---------+-----------+---------+-----------+--------------  ----+  Common Iliac  overlying  bowel    Mid                                                       gas/not  well                                                                  visualized      +---------------+---------+-----------+---------+-----------+--------------  ----+  Common Iliac                        patent                                   Distal                                                                       +---------------+---------+-----------+---------+-----------+--------------  ----+       +-------------------------+---------+-----------+---------+-----------+----  ----+             EIV             RT-PatentRT-ThrombusLT-PatentLT-ThrombusComments  +-------------------------+---------+-----------+---------+-----------+----  ----+  External Iliac Vein Prox                      patent                         +-------------------------+---------+-----------+---------+-----------+----  ----+  External Iliac Vein Mid                       patent                         +-------------------------+---------+-----------+---------+-----------+----  ----+  External Iliac Vein                           patent                         Distal                                                                       +-------------------------+---------+-----------+---------+-----------+----  ----+          Summary:  IVC/Iliac: There is no evidence of thrombus involving the IVC. There is no  evidence of thrombus involving the left common iliac vein. There is no  evidence of thrombus involving the left external iliac vein. Visualization  of  mid common Iliac and mid  inferior vena cava was limited. Chronic thrombus involving the left common  femoral vein which appears unchanged in comparison to the same image on  Reflux exam 10/17/2019.      Assessment/Plan:     69 several female with history of extensive left lower extremity DVT also had retroperitoneal hematoma and she had an IVC filter that was placed and then removed.  Recently she was rediagnosed with DVT when she had some swelling of her left leg.  I reviewed the reading of this given that she had significant femoral vein disease that was chronic I am unsure that this was acute DVT.  Patient had transition to aspirin and I think it would be safe for her to take her Eliquis as stand-alone therapy for a total of 3 months up until Thanksgiving and then transition back to aspirin.  Certainly if she has concern of swelling again and needs another DVT study she may need to be on anticoagulation lifelong I am just  unsure that she had an actual DVT on this instance.  I discussed this with the patient she demonstrates very good understanding I will have her back in 1 year to reevaluate her stents with duplex.     Waynetta Sandy MD [facility]

## 2021-02-25 DIAGNOSIS — J449 Chronic obstructive pulmonary disease, unspecified: Secondary | ICD-10-CM | POA: Diagnosis not present

## 2021-03-03 DIAGNOSIS — H02132 Senile ectropion of right lower eyelid: Secondary | ICD-10-CM | POA: Diagnosis not present

## 2021-03-03 DIAGNOSIS — H02135 Senile ectropion of left lower eyelid: Secondary | ICD-10-CM | POA: Diagnosis not present

## 2021-03-03 DIAGNOSIS — H04551 Acquired stenosis of right nasolacrimal duct: Secondary | ICD-10-CM | POA: Diagnosis not present

## 2021-03-03 DIAGNOSIS — H02403 Unspecified ptosis of bilateral eyelids: Secondary | ICD-10-CM | POA: Diagnosis not present

## 2021-03-27 DIAGNOSIS — J449 Chronic obstructive pulmonary disease, unspecified: Secondary | ICD-10-CM | POA: Diagnosis not present

## 2021-03-28 DIAGNOSIS — J441 Chronic obstructive pulmonary disease with (acute) exacerbation: Secondary | ICD-10-CM | POA: Diagnosis not present

## 2021-03-28 DIAGNOSIS — R042 Hemoptysis: Secondary | ICD-10-CM | POA: Diagnosis not present

## 2021-03-28 DIAGNOSIS — J45901 Unspecified asthma with (acute) exacerbation: Secondary | ICD-10-CM | POA: Diagnosis not present

## 2021-03-28 DIAGNOSIS — R051 Acute cough: Secondary | ICD-10-CM | POA: Diagnosis not present

## 2021-04-05 ENCOUNTER — Ambulatory Visit: Payer: PPO | Admitting: Internal Medicine

## 2021-04-05 DIAGNOSIS — H02135 Senile ectropion of left lower eyelid: Secondary | ICD-10-CM | POA: Diagnosis not present

## 2021-04-05 DIAGNOSIS — H02403 Unspecified ptosis of bilateral eyelids: Secondary | ICD-10-CM | POA: Diagnosis not present

## 2021-04-05 DIAGNOSIS — Z01818 Encounter for other preprocedural examination: Secondary | ICD-10-CM | POA: Diagnosis not present

## 2021-04-05 DIAGNOSIS — H02132 Senile ectropion of right lower eyelid: Secondary | ICD-10-CM | POA: Diagnosis not present

## 2021-04-05 NOTE — Progress Notes (Signed)
Patient ID: [name], female    DOB: [birth year], 80 y.o.   MRN: [id number]  HPI female former smoker followed for COPD, allergic rhinitis, complicated by GERD, chronic back pain Office Spirometry 12/16/13- Severe obstructive airways disease, FVC 2.13/ 65%, FEV1 1.08/ 44%, FEV1/FVC 0.51, FEF25-75% 0.47/ 22%. History of intense reaction shortly after receiving pneumonia vaccine, leading to hospitalization. She is considered "allergic" and will not take pneumonia vaccine Office Spirometry 06/22/2016-severe obstructive airways disease. FVC 2.44/78%, FEV1 1.04/44%, ratio 0.43, FEF 25-75% 0.39/21%  ----------------------------------------------------------------   09/29/20- 80 year old female former smoker followed for COPD, Chronic Hypoxic Resp Failure,  allergic rhinitis, complicated by GERD, chronic back pain, PE 01/28/2019/  DVT(L)/, lower GI bleed> dc'd Eliquis, Covid pneumonia Jan 2022,  -Neb albuterol, albuterol hfa, Advair 250, Spiriva 2.5 Respimat,  O2 2-3L/ [facility] Covid vax-2 [facility] f/u- 1/13-1/15/22- Covid pneumonia -----C/o weakness, sob on exertion same, pnd, cough-yellow x 2-3 wks,  Not using O2 a lot in daytime.  Had double pneumonia with Covid infection - residual weakness. Now in last 2weeks chesst tightness blamed on "pollen" for which she asks depo inj.  She gets samples of Spiriva. CXR 06/10/20-  IMPRESSION: Persistent hazy bilateral airspace opacities, greatest in the right mid lung zone, somewhat improved from recent prior study which may be in part due to technique.  04/06/21- 80 year old female former smoker followed for COPD, Chronic Hypoxic Resp Failure,  allergic rhinitis, complicated by GERD, chronic back pain, PE 01/28/2019/  DVT(L)/, lower GI bleed> dc'd Eliquis, Covid pneumonia Jan 2022,  -Neb albuterol, albuterol hfa, Advair 250, Spiriva 2.5 Respimat,  O2 2-3L/ [facility] Covid vax-2 [facility] Flu vax-had ------C/o cough-yellow, wheezing, sob-same PCP Rx'd  Zpak, steroid inj and prednisone taper. but sick x 2 weeks and not really better. They told her CXR "ok". Still wheeze and productive cough.  No fever or blood. Also intermittent epigastric cramping pain for which I asked her to contact her [facility]. She is scheduled for blepharoplasty with iv sedation on 11/30 and asks clearance. Asks "stronger antibiotic". Has tolerated cephalosporins in past.  CTa Chest PE 01/15/20- IMPRESSION: No definite evidence of pulmonary embolus. Moderate coronary artery calcifications are noted. Stable right upper and lower lobe subsegmental atelectasis or scarring is noted. Aortic Atherosclerosis (ICD10-I70.0).  ROS-see HPI    + = positive Constitutional:   No-   weight loss, night sweats, fevers, chills, fatigue, lassitude. HEENT:   No-  headaches, difficulty swallowing, tooth/dental problems, sore throat,       No- sneezing, itching, ear ache,nasal congestion, +post nasal drip,  CV:  No-   chest pain, orthopnea, PND, swelling in lower extremities, anasarca, dizziness, palpitations Resp: + shortness of breath with exertion or at rest.              No-   productive cough,  + non-productive cough,  No- coughing up of blood.              No-   change in color of mucus.  +occasional mild wheezing.   Skin: No-   rash or lesions. GI:  No-   heartburn, indigestion, abdominal pain, nausea, vomiting,  GU:  MS:  No-   joint pain or swelling. + Acute/chronic back pain Neuro-     nothing unusual Psych:  No- change in mood or affect. No depression or anxiety.  No memory loss.  OBJ- Physical Exam General- Alert, Oriented, Affect-appropriate, Distress- none acute Skin- rash-none, lesions- none, excoriation- none Lymphadenopathy- none Head- atraumatic  Eyes- Gross vision intact, PERRLA, conjunctivae and secretions clear            Ears- Hearing, canals-normal            Nose- Clear, no-Septal dev, mucus, polyps, erosion, perforation             Throat-  Mallampati II , mucosa clear , drainage- none, tonsils- atrophic Neck- flexible , trachea midline, no stridor , thyroid nl, carotid no bruit Chest - symmetrical excursion , unlabored           Heart/CV- RRR , no murmur , no gallop  , no rub, nl s1 s2                           - JVD- none , edema- none, stasis changes- none, varices- none           Lung-  +distant, unlabored, cough- none, wheeze-+, rhonchi-none  , dullness-none, rub- none           Chest wall-  Abd-  Br/ Gen/ Rectal- Not done, not indicated Extrem- cyanosis- none, clubbing, none, atrophy- none, strength- nl Neuro- +slight droop L eye.

## 2021-04-06 ENCOUNTER — Other Ambulatory Visit: Payer: Self-pay

## 2021-04-06 ENCOUNTER — Encounter: Payer: Self-pay | Admitting: Internal Medicine

## 2021-04-06 ENCOUNTER — Ambulatory Visit: Payer: PPO | Admitting: Internal Medicine

## 2021-04-06 VITALS — BP 128/68 | HR 77 | Temp 97.9°F | Ht 66.0 in | Wt 170.4 lb

## 2021-04-06 DIAGNOSIS — J441 Chronic obstructive pulmonary disease with (acute) exacerbation: Secondary | ICD-10-CM | POA: Diagnosis not present

## 2021-04-06 MED ORDER — CEFDINIR 300 MG PO CAPS: 300.0000 mg | ORAL_CAPSULE | Freq: Two times a day (BID) | ORAL | 0 refills | Status: AC

## 2021-04-06 MED ORDER — METHYLPREDNISOLONE ACETATE 80 MG/ML IJ SUSP
80.0000 mg | Freq: Once | INTRAMUSCULAR | Status: AC
  Administered 2021-04-06: 80 mg via INTRAMUSCULAR

## 2021-04-06 NOTE — Patient Instructions (Signed)
Script sent for cefdinir antibiotic  Please call your GI doctor about your abdominal pain and bloating. You need to get that looked at.  I expect you to feel well enough for planned surgery by November 30. If you don't feel well by a week before that, please let me know.

## 2021-04-06 NOTE — Assessment & Plan Note (Signed)
If this is viral it is too late for likely benefit from antivirals. Will try cefdinir. She will continue routine inhaled meds. I expect her to be at baseline in time for planned surgery, but she is to contact us a week ahead if not resolving.

## 2021-04-25 ENCOUNTER — Other Ambulatory Visit: Payer: Self-pay | Admitting: Gastroenterology

## 2021-04-25 DIAGNOSIS — R1011 Right upper quadrant pain: Secondary | ICD-10-CM | POA: Diagnosis not present

## 2021-04-25 DIAGNOSIS — R6881 Early satiety: Secondary | ICD-10-CM

## 2021-04-25 DIAGNOSIS — R1013 Epigastric pain: Secondary | ICD-10-CM | POA: Diagnosis not present

## 2021-04-25 DIAGNOSIS — K219 Gastro-esophageal reflux disease without esophagitis: Secondary | ICD-10-CM | POA: Diagnosis not present

## 2021-04-26 ENCOUNTER — Encounter: Payer: Self-pay | Admitting: Internal Medicine

## 2021-04-27 DIAGNOSIS — J449 Chronic obstructive pulmonary disease, unspecified: Secondary | ICD-10-CM | POA: Diagnosis not present

## 2021-04-28 DIAGNOSIS — J449 Chronic obstructive pulmonary disease, unspecified: Secondary | ICD-10-CM | POA: Diagnosis not present

## 2021-04-28 DIAGNOSIS — E7801 Familial hypercholesterolemia: Secondary | ICD-10-CM | POA: Diagnosis not present

## 2021-05-02 ENCOUNTER — Ambulatory Visit
Admission: RE | Admit: 2021-05-02 | Discharge: 2021-05-02 | Disposition: A | Discharge status: Home / Self Care | Payer: PPO | Source: Ambulatory Visit | Attending: Gastroenterology | Admitting: Gastroenterology

## 2021-05-02 DIAGNOSIS — K8020 Calculus of gallbladder without cholecystitis without obstruction: Secondary | ICD-10-CM | POA: Diagnosis not present

## 2021-05-02 DIAGNOSIS — R6881 Early satiety: Secondary | ICD-10-CM

## 2021-05-02 DIAGNOSIS — N281 Cyst of kidney, acquired: Secondary | ICD-10-CM | POA: Diagnosis not present

## 2021-05-02 DIAGNOSIS — R1011 Right upper quadrant pain: Secondary | ICD-10-CM

## 2021-05-02 DIAGNOSIS — R1013 Epigastric pain: Secondary | ICD-10-CM

## 2021-05-03 NOTE — Progress Notes (Signed)
Surgical Instructions    Your procedure is scheduled on Thursday, December 15th, 2022.   Report to [facility] Main Entrance "A" at 05:30 A.M., then check in with the Admitting office.  Call this number if you have problems the morning of surgery:  [phone]   If you have any questions prior to your surgery date call [phone]: Open Monday-Friday 8am-4pm    Remember:  Do not eat after midnight the night before your surgery  You may drink clear liquids until 04:30 the morning of your surgery.   Clear liquids allowed are: Water, Non-Citrus Juices (without pulp), Carbonated Beverages, Clear Tea, Black Coffee ONLY (NO MILK, CREAM OR POWDERED CREAMER of any kind), and Gatorade    Take these medicines the morning of surgery with A SIP OF WATER:  escitalopram (LEXAPRO) fluticasone-salmeterol (ADVAIR) omeprazole (PRILOSEC) Tiotropium Bromide Monohydrate (SPIRIVA RESPIMAT)  If needed:  acetaminophen (TYLENOL)  albuterol (VENTOLIN HFA)  cetirizine (ZYRTEC)   Please, bring all inhalers with you the day of surgery.  Follow your surgeon's instructions on when to stop Aspirin and Eliquis.  If no instructions were given by your surgeon then you will need to call the office to get those instructions.     As of today, STOP taking any Aspirin (unless otherwise instructed by your surgeon) Aleve, Naproxen, Ibuprofen, Motrin, Advil, Goody's, BC's, all herbal medications, fish oil, and all vitamins.    The day of surgery:          Do not wear jewelry or makeup Do not wear lotions, powders, perfumes, or deodorant. Do not shave 48 hours prior to surgery.   Do not bring valuables to the hospital. DO Not wear nail polish, gel polish, artificial nails, or any other type of covering on natural nails including finger and toenails. If patients have artificial nails, gel coating, etc. that need to be removed by a nail salon, please have this removed prior to surgery or surgery may need to be  canceled/delayed if the surgeon/ anesthesia feels like the patient is unable to be adequately monitored.              [facility] is not responsible for any belongings or valuables.  Do NOT Smoke (Tobacco/Vaping)  24 hours prior to your procedure  If you use a CPAP at night, you may bring your mask for your overnight stay.   Contacts, glasses, hearing aids, dentures or partials may not be worn into surgery, please bring cases for these belongings   For patients admitted to the hospital, discharge time will be determined by your treatment team.   Patients discharged the day of surgery will not be allowed to drive home, and someone needs to stay with them for 24 hours.  NO VISITORS WILL BE ALLOWED IN PRE-OP WHERE PATIENTS ARE PREPPED FOR SURGERY.  ONLY 1 SUPPORT PERSON MAY BE PRESENT IN THE WAITING ROOM WHILE YOU ARE IN SURGERY.  IF YOU ARE TO BE ADMITTED, ONCE YOU ARE IN YOUR ROOM YOU WILL BE ALLOWED TWO (2) VISITORS. 1 (ONE) VISITOR MAY STAY OVERNIGHT BUT MUST ARRIVE TO THE ROOM BY 8pm.  Minor children may have two parents present. Special consideration for safety and communication needs will be reviewed on a case by case basis.  Special instructions:    Oral Hygiene is also important to reduce your risk of infection.  Remember - BRUSH YOUR TEETH THE MORNING OF SURGERY WITH YOUR REGULAR TOOTHPASTE   [facility]- Preparing For Surgery  Before surgery, you can  play an important role. Because skin is not sterile, your skin needs to be as free of germs as possible. You can reduce the number of germs on your skin by washing with CHG (chlorahexidine gluconate) Soap before surgery.  CHG is an antiseptic cleaner which kills germs and bonds with the skin to continue killing germs even after washing.     Please do not use if you have an allergy to CHG or antibacterial soaps. If your skin becomes reddened/irritated stop using the CHG.  Do not shave (including legs and underarms) for at least 48  hours prior to first CHG shower. It is OK to shave your face.  Please follow these instructions carefully.     Shower the NIGHT BEFORE SURGERY and the MORNING OF SURGERY with CHG Soap.   If you chose to wash your hair, wash your hair first as usual with your normal shampoo. After you shampoo, rinse your hair and body thoroughly to remove the shampoo.  Then [employer] and genitals (private parts) with your normal soap and rinse thoroughly to remove soap.  After that Use CHG Soap as you would any other liquid soap. You can apply CHG directly to the skin and wash gently with a scrungie or a clean washcloth.   Apply the CHG Soap to your body ONLY FROM THE NECK DOWN.  Do not use on open wounds or open sores. Avoid contact with your eyes, ears, mouth and genitals (private parts). Wash Face and genitals (private parts)  with your normal soap.   Wash thoroughly, paying special attention to the area where your surgery will be performed.  Thoroughly rinse your body with warm water from the neck down.  DO NOT shower/wash with your normal soap after using and rinsing off the CHG Soap.  Pat yourself dry with a CLEAN TOWEL.  Wear CLEAN PAJAMAS to bed the night before surgery  Place CLEAN SHEETS on your bed the night before your surgery  DO NOT SLEEP WITH PETS.   Day of Surgery:  Take a shower with CHG soap. Wear Clean/Comfortable clothing the morning of surgery Do not apply any deodorants/lotions.   Remember to brush your teeth WITH YOUR REGULAR TOOTHPASTE.   Please read over the following fact sheets that you were given.

## 2021-05-04 ENCOUNTER — Encounter (HOSPITAL_COMMUNITY): Payer: Self-pay

## 2021-05-04 ENCOUNTER — Encounter (HOSPITAL_COMMUNITY)
Admission: RE | Admit: 2021-05-04 | Discharge: 2021-05-04 | Disposition: A | Discharge status: Home / Self Care | Payer: PPO | Source: Ambulatory Visit | Attending: Optometry | Admitting: Optometry

## 2021-05-04 ENCOUNTER — Other Ambulatory Visit: Payer: Self-pay

## 2021-05-04 VITALS — BP 155/91 | HR 83 | Temp 98.6°F | Resp 17 | Ht 66.0 in | Wt 164.0 lb

## 2021-05-04 DIAGNOSIS — H02403 Unspecified ptosis of bilateral eyelids: Secondary | ICD-10-CM | POA: Insufficient documentation

## 2021-05-04 DIAGNOSIS — E559 Vitamin D deficiency, unspecified: Secondary | ICD-10-CM | POA: Diagnosis not present

## 2021-05-04 DIAGNOSIS — J449 Chronic obstructive pulmonary disease, unspecified: Secondary | ICD-10-CM | POA: Insufficient documentation

## 2021-05-04 DIAGNOSIS — Z8616 Personal history of COVID-19: Secondary | ICD-10-CM | POA: Diagnosis not present

## 2021-05-04 DIAGNOSIS — J45909 Unspecified asthma, uncomplicated: Secondary | ICD-10-CM | POA: Diagnosis not present

## 2021-05-04 DIAGNOSIS — Z01812 Encounter for preprocedural laboratory examination: Secondary | ICD-10-CM | POA: Diagnosis not present

## 2021-05-04 DIAGNOSIS — H02132 Senile ectropion of right lower eyelid: Secondary | ICD-10-CM | POA: Diagnosis not present

## 2021-05-04 DIAGNOSIS — K8020 Calculus of gallbladder without cholecystitis without obstruction: Secondary | ICD-10-CM | POA: Diagnosis not present

## 2021-05-04 DIAGNOSIS — Z86711 Personal history of pulmonary embolism: Secondary | ICD-10-CM | POA: Diagnosis not present

## 2021-05-04 DIAGNOSIS — H02135 Senile ectropion of left lower eyelid: Secondary | ICD-10-CM | POA: Insufficient documentation

## 2021-05-04 DIAGNOSIS — H04551 Acquired stenosis of right nasolacrimal duct: Secondary | ICD-10-CM | POA: Insufficient documentation

## 2021-05-04 DIAGNOSIS — K219 Gastro-esophageal reflux disease without esophagitis: Secondary | ICD-10-CM | POA: Insufficient documentation

## 2021-05-04 DIAGNOSIS — Z87891 Personal history of nicotine dependence: Secondary | ICD-10-CM | POA: Diagnosis not present

## 2021-05-04 DIAGNOSIS — Z7901 Long term (current) use of anticoagulants: Secondary | ICD-10-CM | POA: Insufficient documentation

## 2021-05-04 DIAGNOSIS — D751 Secondary polycythemia: Secondary | ICD-10-CM | POA: Diagnosis not present

## 2021-05-04 DIAGNOSIS — R911 Solitary pulmonary nodule: Secondary | ICD-10-CM | POA: Diagnosis not present

## 2021-05-04 DIAGNOSIS — Z86718 Personal history of other venous thrombosis and embolism: Secondary | ICD-10-CM | POA: Insufficient documentation

## 2021-05-04 DIAGNOSIS — I10 Essential (primary) hypertension: Secondary | ICD-10-CM | POA: Diagnosis not present

## 2021-05-04 DIAGNOSIS — Z01818 Encounter for other preprocedural examination: Secondary | ICD-10-CM

## 2021-05-04 DIAGNOSIS — Z0000 Encounter for general adult medical examination without abnormal findings: Secondary | ICD-10-CM | POA: Diagnosis not present

## 2021-05-04 DIAGNOSIS — E7800 Pure hypercholesterolemia, unspecified: Secondary | ICD-10-CM | POA: Diagnosis not present

## 2021-05-04 LAB — BASIC METABOLIC PANEL
Anion gap: 8 (ref 5–15)
BUN: 24 mg/dL — ABNORMAL HIGH (ref 8–23)
CO2: 27 mmol/L (ref 22–32)
Calcium: 9 mg/dL (ref 8.9–10.3)
Chloride: 104 mmol/L (ref 98–111)
Creatinine, Ser: 0.98 mg/dL (ref 0.44–1.00)
GFR, Estimated: 59 mL/min — ABNORMAL LOW (ref >60)
Glucose, Bld: 91 mg/dL (ref 70–99)
Potassium: 4.4 mmol/L (ref 3.5–5.1)
Sodium: 139 mmol/L (ref 135–145)

## 2021-05-04 LAB — CBC
HCT: 52 % — ABNORMAL HIGH (ref 36.0–46.0)
Hemoglobin: 16.7 g/dL — ABNORMAL HIGH (ref 12.0–15.0)
MCH: 30.4 pg (ref 26.0–34.0)
MCHC: 32.1 g/dL (ref 30.0–36.0)
MCV: 94.5 fL (ref 80.0–100.0)
Platelets: 367 10*3/uL (ref 150–400)
RBC: 5.5 MIL/uL — ABNORMAL HIGH (ref 3.87–5.11)
RDW: 13.7 % (ref 11.5–15.5)
WBC: 8.2 10*3/uL (ref 4.0–10.5)
nRBC: 0 % (ref 0.0–0.2)

## 2021-05-04 NOTE — Progress Notes (Signed)
Abnormal labs in [name]: HCT 52.0; Hgb 16.7. Dr. Hollice Espy, Elberta Fortis was notified via [employer] (unable to reach over the phone)

## 2021-05-04 NOTE — Progress Notes (Addendum)
PCP - Deland Pretty, MD Cardiologist - denies  PPM/ICD - denies Device Orders - n/a Rep Notified - n/a  Chest x-ray - n/a EKG - 01/14/2021 Stress Test - denies ECHO - 01/28/2019 Cardiac Cath - 05/26/2919  Sleep Study - denies CPAP - n/a  Fasting Blood Sugar - n/a   Blood Thinner Instructions: Eliquis - last dose - 04/20/2021 per patient            Aspirin - last dose - 05/05/2021 per patient  Aspirin Instructions: Patient was instructed: As of today, STOP taking any Aspirin (unless otherwise instructed by your surgeon) Aleve, Naproxen, Ibuprofen, Motrin, Advil, Goody's, BC's, all herbal medications, fish oil, and all vitamins.    ERAS Protcol - yes PRE-SURGERY Ensure or G2- no  COVID TEST- no - ambulatory surgery   Anesthesia review: yes - history of PE in 2020; patient is using oxygen PRN at home but very rare. Abnormal lab in PAT. Dr. Hollice Espy, [place] Fortis was notified via [employer].  Patient denies shortness of breath, fever, cough and chest pain at PAT appointment   All instructions explained to the patient, with a verbal understanding of the material. Patient agrees to go over the instructions while at home for a better understanding. Patient also instructed to self quarantine after being tested for COVID-19. The opportunity to ask questions was provided.

## 2021-05-05 NOTE — Progress Notes (Addendum)
Anesthesia Chart Review:  Case: [id number] Date/Time: 05/12/21 0715   Procedures:      EXTERNAL PTOSIS REPAIR LEVATOR ADVANCEDMENT/RESECTION OF BILATERAL UPPER EYE LIDS (Bilateral)     REPAIR OF ECTROPION BY LATERAL TARSAL STRIP OF BILATERAL LOWER EYE LISDS (Bilateral)     RIGHT NASOLACRIMAL DUCT PROBE WITH MINI MONOKA STENT PLACEMENT (Right)   Anesthesia type: General   Pre-op diagnosis: SENILE ECTROPION OF RIGHT AND LEFT LOWER EYE LIDS, PTOSIS OF RIGHT AND LEFT UPPER EYE LIDS, AQUIRED STENOSIS OF RIGHT NASOLACRIMAL DUCT   Location: [place] OR ROOM 08 / [place] OR   Surgeons: Delia Chimes, MD       DISCUSSION: Patient is a 80 year old female scheduled for the above procedure.    History includes former smoker (quit 05/27/13), COPD (as needed home O2 2-3L, "rare" use), asthma, GERd, recurrent DVT (LLE DVT 01/13/19; s/p mechanical thrombectomy left CIV, EIV, CFV, and left femoral vein with balloon angioplasty of left femoral vein, and stent of left CIV, left EIV, and left CFV 05/26/19; subacute to chronic LLE DVT 01/14/21, vascular surgery recommended 3 months Eliquis then ASA alone), spontaneous left retroperitoneal hematoma (01/13/19), IVC filter (01/13/19, removal 03/24/19). PE (01/28/19), GI bleed (07/2019, cecal AVM s/p APC 08/20/19), COVID-19 PNA (05/2020 with 2 day admission).   Last pulmonology visit with [name] was on 04/06/21 for COPD follow-up and preoperative evaluation. She reported Zpak, steroid taper for cough a wheezing a few weeks prior, but was still have some symptoms. Too late to try anti-virals, but prescribed cefdinir and recommended continue routine inhaled medications. Blepharoplasty was scheduled for 04/27/21 then. He expected her to be recovered before planned surgery, and if not she could contact him the week prior. He did recommend she follow-up with GI due to abdominal pain and bloating. [facility] records pending, but abdominal US 05/02/21 showed cholelithiasis with slight thickening but  negative sonographic Murphy's and a 5.7 cm left renal cyst.) No SOB, chest pain, cough, or fever per PAT RN interview.   Patient has been off Eliquis since around Thanksgiving as recommended by [name]. She is on ASA, last dose reported as 05/05/21.   Requested last PCP and GI notes, but not yet received.  Surgeon orders not available at PAT.     ADDENDUM 05/10/21 4/25 PM: 04/25/21 GI note by Andree Coss, PA. Abdominal US ordered and PPI increased for GERD, RUQ pain and early satiety. Avoid dietary triggers and follow-up in 4-6 weeks recommended. LFTs normal 04/28/21 at [facility]. PCP note from Thedora Hinders, [name] received. Last visit 05/04/21 for annual wellness visit. Lungs clear. Patient "doing well". She noted patient plans for surgery. Six month follow-up planned.   Anesthesia team to evaluate on the day of surgery.   VS: BP (!) 155/91   Pulse 83   Temp 37 C (Oral)   Resp 17   Ht 5\' 6"  (1.676 m)   Wt 74.4 kg   SpO2 93%   BMI 26.47 kg/m    PROVIDERS: [name], [name]/Pharr, Thayer Jew, MD is PCP [facility]). Last office note requested.  Sullivan Lone, MD is HEM-ONC. Last evaluation 07/28/19. By notes, "03/13/2019 JAK2 sequencing revealed 'No mutations'". Would consider moving patient to preventative dose Eliquis in the future if repeat US has no significant findings and labs stable in 6 months. - Servando Snare, MD is vascular surgeon. Last evaluation 02/16/21. He noted diagnosis of recurrent LLE DVT in August. She was being treated with Eliquis and ASA.  He reviewed and given her femoral vein disease did not feel DVT was likely acute. She had bleeding issues in the past, bot recommended Eliquis as stand-alone therapy for a total of 3 months, then transition back to ASA alone. She she have another DVT may have to consider lifelong anticoagulation. One year follow-up planned. Baird Lyons, MD is pulmonologist. Last evaluation 04/06/21. [place] Pink, MD is urologist ([facility]) - Ronald Lobo, MD is GI [facility])   LABS: Labs reviewed: Acceptable for surgery. LFTs normal on 04/28/21 ([place]). (all labs ordered are listed, but only abnormal results are displayed)  Labs Reviewed  BASIC METABOLIC PANEL - Abnormal; Notable for the following components:      Result Value   BUN 24 (*)    GFR, Estimated 59 (*)    All other components within normal limits  CBC - Abnormal; Notable for the following components:   RBC 5.50 (*)    Hemoglobin 16.7 (*)    HCT 52.0 (*)    All other components within normal limits    OTHER: Colonoscopy 08/20/19: IMPRESSION: - A single non-bleeding colonic angiodysplastic lesion. Treated with argon plasma coagulation (APC). This lesion is the suspected source of the patient's recent blood loss. - Diverticulosis in the sigmoid colon. - The examined portion of the ileum was normal. - The distal rectum and anal verge are normal on retroflexion view. - No specimens collected.  EGD 08/20/19: IMPRESSION: - Normal larynx. - Normal esophagus. - 3 cm hiatal hernia. - Normal stomach. No evidence of ASA gastropathy. - Normal examined duodenum. - No specimens collected. - NO SOURCE OF ANEMIA OR HEME POSITIVITY evident on this exam.  [facility] Spirometry 06/22/2016-severe obstructive airways disease. FVC 2.44/78%, FEV1 1.04/44%, ratio 0.43, FEF 25-75% 0.39/21%    IMAGES: [place] Abd 05/02/21: IMPRESSION: 1. Cholelithiasis with slight wall thickening but negative sonographic Murphy's. Correlation with nuclear medicine hepatobiliary imaging could be considered. 2. 5.7 cm left renal cyst.   CTA Chest 01/14/21: IMPRESSION: - No definite evidence of pulmonary embolus. - Moderate coronary artery calcifications are noted. - Stable right upper and lower lobe subsegmental atelectasis or scarring is noted. - Aortic Atherosclerosis (ICD10-I70.0).     EKG: 01/14/21: Sinus rhythm Low voltage, precordial leads Consider anterior infarct No significant change since last tracing Confirmed by Isla Pence [phone]) on 01/14/2021 11:53:24 AM     CV: [place] IVC/Iliac (venous) 02/16/21: Summary:  IVC/Iliac: There is no evidence of thrombus involving the IVC. There is no evidence of thrombus involving the left common iliac vein. There is no evidence of thrombus involving the left external iliac vein. Visualization of mid common Iliac and mid inferior vena cava was limited. Chronic thrombus involving the left common femoral vein which appears unchanged in comparison to the same image on Reflux exam 10/17/2019.      LLE Venous US 01/14/21: IMPRESSION: Positive exam for DVT within the common femoral, femoral, and popliteal veins as above. DVT appears more subacute to chronic rather than acute.      Echo 01/28/19 (in setting of acute PE): IMPRESSIONS   1. The left ventricle has normal systolic function, with an ejection  fraction of 55-60%. The cavity size was normal. Left ventricular diastolic  parameters were normal. No evidence of left ventricular regional wall  motion abnormalities.   2. The right ventricle has low normal systolic function. The cavity was  normal. There is no increase in right ventricular wall thickness. Right  ventricular  systolic pressure is mildly elevated with an estimated  pressure of 33.7 mmHg.   3. The aorta is normal unless otherwise noted.   4. The interatrial septum was not assessed.      Cardiac cath 03/02/04:  CONCLUSION:  1.  Essentially normal coronary arteries.  2.  Normal left ventricular function.  3.  The symptoms at this point appear to be non-cardiac in origin.  Past Medical History:  Diagnosis Date   Allergic rhinitis    Anemia due to GI blood loss    Asthma    Chronic airway obstruction, not elsewhere classified    Depressive disorder, not elsewhere classified    DVT (deep venous thrombosis)  (HCC)    LLE DVT 01/13/19   Esophageal reflux    Essential hypertension 08/27/2019   PE (pulmonary thromboembolism) ([place]) 01/29/2019   Pneumonia    Wears partial dentures     Past Surgical History:  Procedure Laterality Date   ABDOMINAL HYSTERECTOMY     ABDOMINAL HYSTERECTOMY     CATARACT EXTRACTION W/ INTRAOCULAR LENS  IMPLANT, BILATERAL     COLONOSCOPY     COLONOSCOPY WITH PROPOFOL N/A 08/20/2019   Procedure: COLONOSCOPY WITH PROPOFOL;  Surgeon: Ronald Lobo, MD;  Location[facility] ENDOSCOPY;  Service: Endoscopy;  Laterality: N/A;   CYSTOSCOPY     DILATION AND CURETTAGE OF UTERUS     ESOPHAGOGASTRODUODENOSCOPY (EGD) WITH PROPOFOL N/A 08/20/2019   Procedure: ESOPHAGOGASTRODUODENOSCOPY (EGD) WITH PROPOFOL;  Surgeon: Ronald Lobo, MD;  Location: [place];  Service: Endoscopy;  Laterality: N/A;   HEMOSTASIS CONTROL  08/20/2019   Procedure: HEMOSTASIS CONTROL;  Surgeon: Ronald Lobo, MD;  Location: [facility] ENDOSCOPY;  Service: Endoscopy;;   INTRAVASCULAR ULTRASOUND/IVUS Left 05/26/2019   Procedure: Intravascular Ultrasound/IVUS;  Surgeon: Waynetta Sandy, MD;  Location: [place] CV LAB;  Service: Cardiovascular;  Laterality: Left;   IVC FILTER REMOVAL N/A 03/24/2019   Procedure: IVC FILTER REMOVAL;  Surgeon: Waynetta Sandy, MD;  Location: [place] CV LAB;  Service: Cardiovascular;  Laterality: N/A;   LOWER EXTREMITY VENOGRAPHY Left 05/26/2019   Procedure: LOWER EXTREMITY VENOGRAPHY;  Surgeon: Waynetta Sandy, MD;  Location: [place] CV LAB;  Service: Cardiovascular;  Laterality: Left;   MULTIPLE TOOTH EXTRACTIONS     NASAL SINUS SURGERY     PERIPHERAL VASCULAR INTERVENTION Left 05/26/2019   Procedure: PERIPHERAL VASCULAR INTERVENTION;  Surgeon: Waynetta Sandy, MD;  Location: [place] CV LAB;  Service: Cardiovascular;  Laterality: Left;  lower extremity veins   PERIPHERAL VASCULAR THROMBECTOMY  05/26/2019   Procedure: PERIPHERAL  VASCULAR THROMBECTOMY;  Surgeon: Waynetta Sandy, MD;  Location: [place] CV LAB;  Service: Cardiovascular;;   TUBAL LIGATION     VENA CAVA FILTER PLACEMENT N/A 01/13/2019   Procedure: INSERTION VENA-CAVA FILTER;  Surgeon: Waynetta Sandy, MD;  Location: [place];  Service: Vascular;  Laterality: N/A;    MEDICATIONS:  acetaminophen (TYLENOL) 500 MG tablet   albuterol (PROVENTIL) (2.5 MG/3ML) 0.083% nebulizer solution   albuterol (VENTOLIN HFA) 108 (90 Base) MCG/ACT inhaler   APIXABAN (ELIQUIS) VTE STARTER PACK (10MG  AND 5MG )   aspirin EC 81 MG tablet   cetirizine (ZYRTEC) 10 MG tablet   escitalopram (LEXAPRO) 20 MG tablet   fluticasone-salmeterol (ADVAIR) 250-50 MCG/ACT AEPB   LORazepam (ATIVAN) 1 MG tablet   omeprazole (PRILOSEC) 20 MG capsule   Tiotropium Bromide Monohydrate (SPIRIVA RESPIMAT) 2.5 MCG/ACT AERS   traZODone (DESYREL) 100 MG tablet   triamterene-hydrochlorothiazide (MAXZIDE-25) 37.5-25 MG tablet   No  current facility-administered medications for this encounter.    Myra Gianotti, PA-C Surgical Short Stay/Anesthesiology [facility] Phone [phone] [facility] Phone [phone] 05/05/2021 5:54 PM

## 2021-05-05 NOTE — H&P (View-Only) (Signed)
Anesthesia Chart Review:  Case: [id number] Date/Time: 05/12/21 0715   Procedures:      EXTERNAL PTOSIS REPAIR LEVATOR ADVANCEDMENT/RESECTION OF BILATERAL UPPER EYE LIDS (Bilateral)     REPAIR OF ECTROPION BY LATERAL TARSAL STRIP OF BILATERAL LOWER EYE LISDS (Bilateral)     RIGHT NASOLACRIMAL DUCT PROBE WITH MINI MONOKA STENT PLACEMENT (Right)   Anesthesia type: General   Pre-op diagnosis: SENILE ECTROPION OF RIGHT AND LEFT LOWER EYE LIDS, PTOSIS OF RIGHT AND LEFT UPPER EYE LIDS, AQUIRED STENOSIS OF RIGHT NASOLACRIMAL DUCT   Location: [place] OR ROOM 08 / [place] OR   Surgeons: Delia Chimes, MD       DISCUSSION: Patient is a 80 year old female scheduled for the above procedure.    History includes former smoker (quit 05/27/13), COPD (as needed home O2 2-3L, "rare" use), asthma, GERd, recurrent DVT (LLE DVT 01/13/19; s/p mechanical thrombectomy left CIV, EIV, CFV, and left femoral vein with balloon angioplasty of left femoral vein, and stent of left CIV, left EIV, and left CFV 05/26/19; subacute to chronic LLE DVT 01/14/21, vascular surgery recommended 3 months Eliquis then ASA alone), spontaneous left retroperitoneal hematoma (01/13/19), IVC filter (01/13/19, removal 03/24/19). PE (01/28/19), GI bleed (07/2019, cecal AVM s/p APC 08/20/19), COVID-19 PNA (05/2020 with 2 day admission).   Last pulmonology visit with [name] was on 04/06/21 for COPD follow-up and preoperative evaluation. She reported Zpak, steroid taper for cough a wheezing a few weeks prior, but was still have some symptoms. Too late to try anti-virals, but prescribed cefdinir and recommended continue routine inhaled medications. Blepharoplasty was scheduled for 04/27/21 then. He expected her to be recovered before planned surgery, and if not she could contact him the week prior. He did recommend she follow-up with GI due to abdominal pain and bloating. [facility] records pending, but abdominal US 05/02/21 showed cholelithiasis with slight thickening but  negative sonographic Murphy's and a 5.7 cm left renal cyst.) No SOB, chest pain, cough, or fever per PAT RN interview.   Patient has been off Eliquis since around Thanksgiving as recommended by [name]. She is on ASA, last dose reported as 05/05/21.   Requested last PCP and GI notes, but not yet received.  Surgeon orders not available at PAT.     ADDENDUM 05/10/21 4/25 PM: 04/25/21 GI note by Andree Coss, PA. Abdominal US ordered and PPI increased for GERD, RUQ pain and early satiety. Avoid dietary triggers and follow-up in 4-6 weeks recommended. LFTs [facility] 04/28/21 at [facility]. PCP note from Thedora Hinders, [name] received. Last visit 05/04/21 for annual wellness visit. Lungs clear. Patient "doing well". She noted patient plans for surgery. Six month follow-up planned.   Anesthesia team to evaluate on the day of surgery.   VS: BP (!) 155/91    Pulse 83    Temp 37 C (Oral)    Resp 17    Ht 5\' 6"  (1.676 m)    Wt 74.4 kg    SpO2 93%    BMI 26.47 kg/m    PROVIDERS: [name], [name]/Pharr, Thayer Jew, MD is PCP [facility]). Last office note requested.  Sullivan Lone, MD is HEM-ONC. Last evaluation 07/28/19. By notes, "03/13/2019 JAK2 sequencing revealed 'No mutations'". Would consider moving patient to preventative dose Eliquis in the future if repeat US has no significant findings and labs stable in 6 months. - Servando Snare, MD is vascular surgeon. Last evaluation 02/16/21. He noted diagnosis of recurrent LLE DVT in August. She  was being treated with Eliquis and ASA. He reviewed and given her femoral vein disease did not feel DVT was likely acute. She had bleeding issues in the past, bot recommended Eliquis as stand-alone therapy for a total of 3 months, then transition back to ASA alone. She she have another DVT may have to consider lifelong anticoagulation. One year follow-up planned. Baird Lyons, MD is pulmonologist. Last evaluation 04/06/21. [place] Pink, MD is urologist ([facility]) - Ronald Lobo, MD is GI [facility])   LABS: Labs reviewed: Acceptable for surgery. LFTs normal on 04/28/21 ([place]). (all labs ordered are listed, but only abnormal results are displayed)  Labs Reviewed  BASIC METABOLIC PANEL - Abnormal; Notable for the following components:      Result Value   BUN 24 (*)    GFR, Estimated 59 (*)    All other components within normal limits  CBC - Abnormal; Notable for the following components:   RBC 5.50 (*)    Hemoglobin 16.7 (*)    HCT 52.0 (*)    All other components within normal limits    OTHER: Colonoscopy 08/20/19: IMPRESSION: - A single non-bleeding colonic angiodysplastic lesion. Treated with argon plasma coagulation (APC). This lesion is the suspected source of the patient's recent blood loss. - Diverticulosis in the sigmoid colon. - The examined portion of the ileum was normal. - The distal rectum and anal verge are normal on retroflexion view. - No specimens collected.  EGD 08/20/19: IMPRESSION: - Normal larynx. - Normal esophagus. - 3 cm hiatal hernia. - Normal stomach. No evidence of ASA gastropathy. - Normal examined duodenum. - No specimens collected. - NO SOURCE OF ANEMIA OR HEME POSITIVITY evident on this exam.  [facility] Spirometry 06/22/2016-severe obstructive airways disease. FVC 2.44/78%, FEV1 1.04/44%, ratio 0.43, FEF 25-75% 0.39/21%    IMAGES: [place] Abd 05/02/21: IMPRESSION: 1. Cholelithiasis with slight wall thickening but negative sonographic Murphy's. Correlation with nuclear medicine hepatobiliary imaging could be considered. 2. 5.7 cm left renal cyst.   CTA Chest 01/14/21: IMPRESSION: - No definite evidence of pulmonary embolus. - Moderate coronary artery calcifications are noted. - Stable right upper and lower lobe subsegmental atelectasis or scarring is noted. - Aortic Atherosclerosis (ICD10-I70.0).     EKG: 01/14/21: Sinus rhythm Low voltage, precordial leads Consider anterior infarct No significant change since last tracing Confirmed by Isla Pence [phone]) on 01/14/2021 11:53:24 AM     CV: [place] IVC/Iliac (venous) 02/16/21: Summary:  IVC/Iliac: There is no evidence of thrombus involving the IVC. There is no evidence of thrombus involving the left common iliac vein. There is no evidence of thrombus involving the left external iliac vein. Visualization of mid common Iliac and mid inferior vena cava was limited. Chronic thrombus involving the left common femoral vein which appears unchanged in comparison to the same image on Reflux exam 10/17/2019.      LLE Venous US 01/14/21: IMPRESSION: Positive exam for DVT within the common femoral, femoral, and popliteal veins as above. DVT appears more subacute to chronic rather than acute.      Echo 01/28/19 (in setting of acute PE): IMPRESSIONS   1. The left ventricle has normal systolic function, with an ejection  fraction of 55-60%. The cavity size was normal. Left ventricular diastolic  parameters were normal. No evidence of left ventricular regional wall  motion abnormalities.   2. The right ventricle has low normal systolic function. The cavity was  normal. There is no increase in  right ventricular wall thickness. Right  ventricular systolic pressure is mildly elevated with an estimated  pressure of 33.7 mmHg.   3. The aorta is normal unless otherwise noted.   4. The interatrial septum was not assessed.      Cardiac cath 03/02/04:  CONCLUSION:  1.  Essentially normal coronary arteries.  2.  Normal left ventricular function.  3.  The symptoms at this point appear to be non-cardiac in origin.  Past Medical History:  Diagnosis Date   Allergic rhinitis    Anemia due to GI blood loss    Asthma    Chronic airway obstruction, not elsewhere classified    Depressive disorder, not elsewhere classified    DVT (deep venous thrombosis)  (HCC)    LLE DVT 01/13/19   Esophageal reflux    Essential hypertension 08/27/2019   PE (pulmonary thromboembolism) ([place]) 01/29/2019   Pneumonia    Wears partial dentures     Past Surgical History:  Procedure Laterality Date   ABDOMINAL HYSTERECTOMY     ABDOMINAL HYSTERECTOMY     CATARACT EXTRACTION W/ INTRAOCULAR LENS  IMPLANT, BILATERAL     COLONOSCOPY     COLONOSCOPY WITH PROPOFOL N/A 08/20/2019   Procedure: COLONOSCOPY WITH PROPOFOL;  Surgeon: Ronald Lobo, MD;  Location: [facility] ENDOSCOPY;  Service: Endoscopy;  Laterality: N/A;   CYSTOSCOPY     DILATION AND CURETTAGE OF UTERUS     ESOPHAGOGASTRODUODENOSCOPY (EGD) WITH PROPOFOL N/A 08/20/2019   Procedure: ESOPHAGOGASTRODUODENOSCOPY (EGD) WITH PROPOFOL;  Surgeon: Ronald Lobo, MD;  Location: [place];  Service: Endoscopy;  Laterality: N/A;   HEMOSTASIS CONTROL  08/20/2019   Procedure: HEMOSTASIS CONTROL;  Surgeon: Ronald Lobo, MD;  Location: [facility] ENDOSCOPY;  Service: Endoscopy;;   INTRAVASCULAR ULTRASOUND/IVUS Left 05/26/2019   Procedure: Intravascular Ultrasound/IVUS;  Surgeon: Waynetta Sandy, MD;  Location: [place] CV LAB;  Service: Cardiovascular;  Laterality: Left;   IVC FILTER REMOVAL N/A 03/24/2019   Procedure: IVC FILTER REMOVAL;  Surgeon: Waynetta Sandy, MD;  Location: [place] CV LAB;  Service: Cardiovascular;  Laterality: N/A;   LOWER EXTREMITY VENOGRAPHY Left 05/26/2019   Procedure: LOWER EXTREMITY VENOGRAPHY;  Surgeon: Waynetta Sandy, MD;  Location: [place] CV LAB;  Service: Cardiovascular;  Laterality: Left;   MULTIPLE TOOTH EXTRACTIONS     NASAL SINUS SURGERY     PERIPHERAL VASCULAR INTERVENTION Left 05/26/2019   Procedure: PERIPHERAL VASCULAR INTERVENTION;  Surgeon: Waynetta Sandy, MD;  Location: [place] CV LAB;  Service: Cardiovascular;  Laterality: Left;  lower extremity veins   PERIPHERAL VASCULAR THROMBECTOMY  05/26/2019   Procedure: PERIPHERAL  VASCULAR THROMBECTOMY;  Surgeon: Waynetta Sandy, MD;  Location: [place] CV LAB;  Service: Cardiovascular;;   TUBAL LIGATION     VENA CAVA FILTER PLACEMENT N/A 01/13/2019   Procedure: INSERTION VENA-CAVA FILTER;  Surgeon: Waynetta Sandy, MD;  Location: [place];  Service: Vascular;  Laterality: N/A;    MEDICATIONS:  acetaminophen (TYLENOL) 500 MG tablet   albuterol (PROVENTIL) (2.5 MG/3ML) 0.083% nebulizer solution   albuterol (VENTOLIN HFA) 108 (90 Base) MCG/ACT inhaler   APIXABAN (ELIQUIS) VTE STARTER PACK (10MG  AND 5MG )   aspirin EC 81 MG tablet   cetirizine (ZYRTEC) 10 MG tablet   escitalopram (LEXAPRO) 20 MG tablet   fluticasone-salmeterol (ADVAIR) 250-50 MCG/ACT AEPB   LORazepam (ATIVAN) 1 MG tablet   omeprazole (PRILOSEC) 20 MG capsule   Tiotropium Bromide Monohydrate (SPIRIVA RESPIMAT) 2.5 MCG/ACT AERS   traZODone (DESYREL) 100 MG tablet   triamterene-hydrochlorothiazide (  MAXZIDE-25) 37.5-25 MG tablet   No current facility-administered medications for this encounter.    Myra Gianotti, PA-C Surgical Short Stay/Anesthesiology [facility] Phone [phone] [facility] Phone [phone] 05/05/2021 5:54 PM

## 2021-05-09 DIAGNOSIS — R1011 Right upper quadrant pain: Secondary | ICD-10-CM | POA: Diagnosis not present

## 2021-05-09 DIAGNOSIS — R1013 Epigastric pain: Secondary | ICD-10-CM | POA: Diagnosis not present

## 2021-05-09 DIAGNOSIS — K219 Gastro-esophageal reflux disease without esophagitis: Secondary | ICD-10-CM | POA: Diagnosis not present

## 2021-05-09 DIAGNOSIS — K8020 Calculus of gallbladder without cholecystitis without obstruction: Secondary | ICD-10-CM | POA: Diagnosis not present

## 2021-05-10 NOTE — Anesthesia Preprocedure Evaluation (Addendum)
Anesthesia Evaluation  Patient identified by MRN, date of birth, ID band Patient awake    Reviewed: Allergy & Precautions, NPO status , Patient's Chart, lab work & pertinent test results  Airway Mallampati: II  TM Distance: >3 FB Neck ROM: Full    Dental  (+) Dental Advisory Given, Missing,    Pulmonary asthma , COPD, former smoker, PE   Pulmonary exam normal breath sounds clear to auscultation       Cardiovascular hypertension, + DVT  Normal cardiovascular exam Rhythm:Regular Rate:Normal  TTE 2020 1. The left ventricle has normal systolic function, with an ejection  fraction of 55-60%. The cavity size was normal. Left ventricular diastolic  parameters were normal. No evidence of left ventricular regional wall  motion abnormalities.  2. The right ventricle has low normal systolic function. The cavity was  normal. There is no increase in right ventricular wall thickness. Right  ventricular systolic pressure is mildly elevated with an estimated  pressure of 33.7 mmHg.  3. The aorta is normal unless otherwise noted.  4. The interatrial septum was not assessed.   Neuro/Psych PSYCHIATRIC DISORDERS Depression negative neurological ROS     GI/Hepatic Neg liver ROS, GERD  Controlled and Medicated,  Endo/Other  negative endocrine ROS  Renal/GU negative Renal ROS  negative genitourinary   Musculoskeletal negative musculoskeletal ROS (+)   Abdominal   Peds  Hematology  (+) Blood dyscrasia (on eliquis), ,   Anesthesia Other Findings   Reproductive/Obstetrics                           Anesthesia Physical Anesthesia Plan  ASA: 3  Anesthesia Plan: MAC   Post-op Pain Management: Tylenol PO (pre-op)   Induction: Intravenous  PONV Risk Score and Plan: 2 and Propofol infusion and Treatment may vary due to age or medical condition  Airway Management Planned: Natural Airway  Additional  Equipment:   Intra-op Plan:   Post-operative Plan:   Informed Consent: I have reviewed the patients History and Physical, chart, labs and discussed the procedure including the risks, benefits and alternatives for the proposed anesthesia with the patient or authorized representative who has indicated his/her understanding and acceptance.     Dental advisory given  Plan Discussed with: CRNA  Anesthesia Plan Comments: (PAT note written by Myra Gianotti, PA-C. )       Anesthesia Quick Evaluation

## 2021-05-11 ENCOUNTER — Encounter (HOSPITAL_COMMUNITY): Payer: Self-pay | Admitting: Optometry

## 2021-05-12 ENCOUNTER — Encounter (HOSPITAL_COMMUNITY)
Admission: RE | Disposition: A | Discharge status: Home / Self Care | Payer: Self-pay | Source: Ambulatory Visit | Attending: Optometry

## 2021-05-12 ENCOUNTER — Ambulatory Visit (HOSPITAL_COMMUNITY)
Admission: RE | Admit: 2021-05-12 | Discharge: 2021-05-12 | Disposition: A | Discharge status: Home / Self Care | Payer: PPO | Source: Ambulatory Visit | Attending: Optometry | Admitting: Optometry

## 2021-05-12 ENCOUNTER — Encounter (HOSPITAL_COMMUNITY): Payer: Self-pay | Admitting: Optometry

## 2021-05-12 ENCOUNTER — Ambulatory Visit (HOSPITAL_COMMUNITY): Payer: PPO | Admitting: Vascular Surgery

## 2021-05-12 ENCOUNTER — Ambulatory Visit (HOSPITAL_COMMUNITY): Payer: PPO | Admitting: General Practice

## 2021-05-12 DIAGNOSIS — H02423 Myogenic ptosis of bilateral eyelids: Secondary | ICD-10-CM | POA: Insufficient documentation

## 2021-05-12 DIAGNOSIS — H5340 Unspecified visual field defects: Secondary | ICD-10-CM | POA: Diagnosis not present

## 2021-05-12 DIAGNOSIS — Z224 Carrier of infections with a predominantly sexual mode of transmission: Secondary | ICD-10-CM | POA: Insufficient documentation

## 2021-05-12 DIAGNOSIS — H02105 Unspecified ectropion of left lower eyelid: Secondary | ICD-10-CM | POA: Insufficient documentation

## 2021-05-12 DIAGNOSIS — H02102 Unspecified ectropion of right lower eyelid: Secondary | ICD-10-CM | POA: Diagnosis not present

## 2021-05-12 DIAGNOSIS — H02132 Senile ectropion of right lower eyelid: Secondary | ICD-10-CM | POA: Diagnosis not present

## 2021-05-12 DIAGNOSIS — H04551 Acquired stenosis of right nasolacrimal duct: Secondary | ICD-10-CM | POA: Diagnosis not present

## 2021-05-12 DIAGNOSIS — H02135 Senile ectropion of left lower eyelid: Secondary | ICD-10-CM | POA: Diagnosis not present

## 2021-05-12 DIAGNOSIS — L988 Other specified disorders of the skin and subcutaneous tissue: Secondary | ICD-10-CM | POA: Insufficient documentation

## 2021-05-12 DIAGNOSIS — A630 Anogenital (venereal) warts: Secondary | ICD-10-CM | POA: Diagnosis not present

## 2021-05-12 DIAGNOSIS — H02403 Unspecified ptosis of bilateral eyelids: Secondary | ICD-10-CM | POA: Diagnosis not present

## 2021-05-12 HISTORY — PX: ECTROPION REPAIR: SHX357

## 2021-05-12 HISTORY — PX: PTOSIS REPAIR: SHX6568

## 2021-05-12 HISTORY — PX: LACRIMAL TUBE INSERTION: SHX1905

## 2021-05-12 HISTORY — PX: LESION EXCISION WITH COMPLEX REPAIR: SHX6700

## 2021-05-12 SURGERY — REPAIR, BLEPHAROPTOSIS
Anesthesia: Monitor Anesthesia Care | Site: Nose | Laterality: Right

## 2021-05-12 MED ORDER — ORAL CARE MOUTH RINSE: 15.0000 mL | Freq: Once | OROMUCOSAL | Status: AC

## 2021-05-12 MED ORDER — PROPOFOL 10 MG/ML IV BOLUS
INTRAVENOUS | Status: AC
  Filled 2021-05-12: qty 20

## 2021-05-12 MED ORDER — LIDOCAINE-EPINEPHRINE 1 %-1:100000 IJ SOLN
INTRAMUSCULAR | Status: DC | PRN
  Administered 2021-05-12: 7 mL via INTRAMUSCULAR

## 2021-05-12 MED ORDER — NEOMYCIN-POLYMYXIN-DEXAMETH 0.1 % OP OINT
TOPICAL_OINTMENT | OPHTHALMIC | Status: AC
  Filled 2021-05-12: qty 3.5

## 2021-05-12 MED ORDER — LACTATED RINGERS IV SOLN: INTRAVENOUS | Status: DC

## 2021-05-12 MED ORDER — PROPOFOL 10 MG/ML IV BOLUS
INTRAVENOUS | Status: DC | PRN
  Administered 2021-05-12: 50 mg via INTRAVENOUS
  Administered 2021-05-12: 10 mg via INTRAVENOUS
  Administered 2021-05-12 (×2): 20 mg via INTRAVENOUS
  Administered 2021-05-12: 30 mg via INTRAVENOUS
  Administered 2021-05-12: 20 mg via INTRAVENOUS
  Administered 2021-05-12: 50 mg via INTRAVENOUS

## 2021-05-12 MED ORDER — BSS IO SOLN
INTRAOCULAR | Status: AC
  Filled 2021-05-12: qty 15

## 2021-05-12 MED ORDER — CHLORHEXIDINE GLUCONATE 0.12 % MT SOLN
15.0000 mL | Freq: Once | OROMUCOSAL | Status: AC
  Administered 2021-05-12: 15 mL via OROMUCOSAL
  Filled 2021-05-12: qty 15

## 2021-05-12 MED ORDER — ROCURONIUM BROMIDE 10 MG/ML (PF) SYRINGE
PREFILLED_SYRINGE | INTRAVENOUS | Status: AC
  Filled 2021-05-12: qty 10

## 2021-05-12 MED ORDER — LIDOCAINE-EPINEPHRINE 1 %-1:100000 IJ SOLN
INTRAMUSCULAR | Status: AC
  Filled 2021-05-12: qty 1

## 2021-05-12 MED ORDER — FENTANYL CITRATE (PF) 250 MCG/5ML IJ SOLN
INTRAMUSCULAR | Status: AC
  Filled 2021-05-12: qty 5

## 2021-05-12 MED ORDER — DEXAMETHASONE SODIUM PHOSPHATE 10 MG/ML IJ SOLN
INTRAMUSCULAR | Status: AC
  Filled 2021-05-12: qty 1

## 2021-05-12 MED ORDER — BUPIVACAINE HCL (PF) 0.75 % IJ SOLN
INTRAMUSCULAR | Status: AC
  Filled 2021-05-12: qty 10

## 2021-05-12 MED ORDER — TETRACAINE HCL 0.5 % OP SOLN
1.0000 [drp] | Freq: Once | OPHTHALMIC | Status: AC
  Administered 2021-05-12: 1 [drp] via OPHTHALMIC
  Filled 2021-05-12: qty 4

## 2021-05-12 MED ORDER — PROPOFOL 500 MG/50ML IV EMUL
INTRAVENOUS | Status: DC | PRN
  Administered 2021-05-12: 75 ug/kg/min via INTRAVENOUS

## 2021-05-12 MED ORDER — ACETAMINOPHEN 500 MG PO TABS
1000.0000 mg | ORAL_TABLET | Freq: Once | ORAL | Status: AC
  Administered 2021-05-12: 1000 mg via ORAL
  Filled 2021-05-12: qty 2

## 2021-05-12 MED ORDER — BACITRACIN-POLYMYXIN B 500-10000 UNIT/GM OP OINT
TOPICAL_OINTMENT | OPHTHALMIC | Status: AC
  Filled 2021-05-12: qty 3.5

## 2021-05-12 MED ORDER — LIDOCAINE 2% (20 MG/ML) 5 ML SYRINGE
INTRAMUSCULAR | Status: AC
  Filled 2021-05-12: qty 5

## 2021-05-12 MED ORDER — LIDOCAINE HCL 3.5 % OP GEL
Freq: Once | OPHTHALMIC | Status: AC
  Filled 2021-05-12: qty 1

## 2021-05-12 MED ORDER — ONDANSETRON HCL 4 MG/2ML IJ SOLN
INTRAMUSCULAR | Status: AC
  Filled 2021-05-12: qty 2

## 2021-05-12 MED ORDER — STERILE WATER FOR IRRIGATION IR SOLN
Status: DC | PRN
  Administered 2021-05-12: 1000 mL

## 2021-05-12 MED ORDER — ERYTHROMYCIN 5 MG/GM OP OINT
TOPICAL_OINTMENT | OPHTHALMIC | Status: AC
  Filled 2021-05-12: qty 3.5

## 2021-05-12 MED ORDER — BACITRACIN-POLYMYXIN B 500-10000 UNIT/GM OP OINT: TOPICAL_OINTMENT | Freq: Three times a day (TID) | OPHTHALMIC | 0 refills | Status: AC

## 2021-05-12 MED ORDER — FENTANYL CITRATE (PF) 100 MCG/2ML IJ SOLN: 25.0000 ug | INTRAMUSCULAR | Status: DC | PRN

## 2021-05-12 MED ORDER — BACITRACIN-POLYMYXIN B 500-10000 UNIT/GM OP OINT
TOPICAL_OINTMENT | OPHTHALMIC | Status: DC | PRN
  Administered 2021-05-12: 1 via OPHTHALMIC

## 2021-05-12 MED ORDER — BSS IO SOLN
INTRAOCULAR | Status: DC | PRN
  Administered 2021-05-12: 15 mL

## 2021-05-12 MED ORDER — OXYMETAZOLINE HCL 0.05 % NA SOLN
1.0000 | NASAL | Status: AC
  Administered 2021-05-12 (×3): 1 via NASAL
  Filled 2021-05-12: qty 30

## 2021-05-12 MED ORDER — ONDANSETRON HCL 4 MG/2ML IJ SOLN
INTRAMUSCULAR | Status: DC | PRN
  Administered 2021-05-12: 4 mg via INTRAVENOUS

## 2021-05-12 SURGICAL SUPPLY — 33 items
APL SRG 3 HI ABS STRL LF PLS (MISCELLANEOUS) ×3
APPLICATOR DR MATTHEWS STRL (MISCELLANEOUS) ×1 IMPLANT
BLADE SURG 15 STRL LF DISP TIS (BLADE) ×8 IMPLANT
BLADE SURG 15 STRL SS (BLADE) ×4
CORD BIPOLAR FORCEPS 12FT (ELECTRODE) ×5 IMPLANT
COVER SURGICAL LIGHT HANDLE (MISCELLANEOUS) ×5 IMPLANT
DRAPE ORTHO SPLIT 77X108 STRL (DRAPES) ×4
DRAPE SURG ORHT 6 SPLT 77X108 (DRAPES) ×4 IMPLANT
DRSG TELFA 3X8 NADH (GAUZE/BANDAGES/DRESSINGS) ×4 IMPLANT
ELECT NDL BLADE 2-5/6 (NEEDLE) ×4 IMPLANT
ELECT NEEDLE BLADE 2-5/6 (NEEDLE) ×4 IMPLANT
ELECT REM PT RETURN 9FT ADLT (ELECTROSURGICAL) ×4
ELECTRODE REM PT RTRN 9FT ADLT (ELECTROSURGICAL) ×4 IMPLANT
FORCEPS BIPOLAR SPETZLER 8 1.0 (NEUROSURGERY SUPPLIES) ×5 IMPLANT
GLOVE SURG ENC MOIS LTX SZ7.5 (GLOVE) ×10 IMPLANT
GOWN STRL REUS W/ TWL LRG LVL3 (GOWN DISPOSABLE) ×8 IMPLANT
GOWN STRL REUS W/TWL LRG LVL3 (GOWN DISPOSABLE) ×8
KIT BASIN OR (CUSTOM PROCEDURE TRAY) ×5 IMPLANT
MARKER SKIN DUAL TIP RULER LAB (MISCELLANEOUS) ×1 IMPLANT
PACK CATARACT CUSTOM (CUSTOM PROCEDURE TRAY) ×5 IMPLANT
PAD ARMBOARD 7.5X6 YLW CONV (MISCELLANEOUS) ×10 IMPLANT
PAD DRESSING TELFA 3X8 NADH (GAUZE/BANDAGES/DRESSINGS) IMPLANT
PENCIL BUTTON HOLSTER BLD 10FT (ELECTRODE) ×5 IMPLANT
PROTECTOR CORNEAL (OPHTHALMIC RELATED) ×2 IMPLANT
PUNCH BIOPSY DERMAL 6MM STRL (MISCELLANEOUS) ×1 IMPLANT
ROLLS DENTAL (MISCELLANEOUS) IMPLANT
STAPLER VISISTAT (STAPLE) ×5 IMPLANT
STRIP CLOSURE SKIN 1/2X4 (GAUZE/BANDAGES/DRESSINGS) ×4 IMPLANT
SUT PLAIN GUT FAST 5-0 (SUTURE) ×1 IMPLANT
SUT VICRYL 6 0 S 14 UNDY (SUTURE) ×2 IMPLANT
TOWEL GREEN STERILE FF (TOWEL DISPOSABLE) ×10 IMPLANT
TUBE LACRIMAL MINI MONOKA 2X40 (Ophthalmic Related) ×1 IMPLANT
WATER STERILE IRR 1000ML POUR (IV SOLUTION) ×5 IMPLANT

## 2021-05-12 NOTE — Op Note (Addendum)
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     Nasolacrimal duct obstruction, right Postoperative Diagnosis: Same plus:     Canalicular stenosis, right lower              Name of Operation:    Bilateral External levator advancement Ptosis Repair [id number]     Bilateral lower eyelid ectropion repair by lateral tarsal strip 67917     Lesion excision of face <1 cm 67893     Repair, intermediate, wounds of face; 2.5 cm or less 12051     Nasolacrimal duct probe with min-monka stent placement, right lower canaliculus 81017   Surgeon:     Delia Chimes, MD, PhD        ANESTHESIA: [place] with local standard oculoplastic block    Lidocaine 2% with Epinephrine 1:100,000 / Marcaine 0.75% / Sodium     Bicarbonate 8.4%    Total volume used: 8 mL   Complications:   None    Blood Loss:     Minimal Specimen:    Nasal dorsal lesion Implant: Implant Name Type Inv. Item Serial No. Manufacturer Lot No. LRB No. Used Action  TUBE LACRIMAL MINI MONOKA 2X40 - [id number] Ophthalmic Related TUBE LACRIMAL MINI MONOKA 2X40  [employer] [id number] Right 1 Implanted          INDICATIONS:  The patient has ptosis of the eyelids causing visual impairment due to superior visual field defect that interferes with tasks of daily living including reading and computer use. The MRD1 is less than 24mm.  Informed consent had been provided prior to the day of surgery at the patients clinic visit. A handout was provided and the patient was asked to sign an eyelid informed consent specific to this procedure. All questions were answered.  The patient understands that the goal of surgery is to improve visual function.   The patient understands the risks of surgery include but are not limited to bleeding, infection, scar formation, asymmetry, the need for additional surgical intervention and other less  common outcomes noted on the specific eyelid informed consent and anesthesia risks listed on the anesthesia consent. The patient accepts the risks and desires to proceed with surgery as the position of the eyelids is blocking the superior visual field interrupting tasks of daily living to include reading, driving and computer use.    Bilateral External PTOSIS REPAIR by levator advancement Attention was directed to the right upper eyelid.  The lid crease had been previously marked in the preoperative area.  A 15 blade was used to incise along the lid crease.  Dissection was carried down to the level of the orbital fat with monopolar cautery.  Septum was cleared of the levator aponeurosis and then an incision was made down to tarsus with monopolar cautery.  A 6-0 Vicryl suture was then used to engage the upper 2 to 3 mm of tarsus and then advance the levator aponeurosis in horizontal mattress fashion.  The patient was sat up to examine the lid height and contour, and once satisfactory the sutures were tied down.  The skin was then closed with 5-0 fast gut suture in running fashion.  Bilateral lower eyelid ectropion repair bilateral tarsal strip  Attention was then directed to the lateral canthus.  A lateral canthotomy was initiated with a 15 blade and then finished with Wescott scissors.  The inferior  limb was then cut with Wescott scissors.  The anterior and posterior lamella were separated with Wescott scissors.  The mucocutaneous junction was excised with Wescott scissors and a 2 mm portion of the mucocutaneous junction of the upper eyelid was then excised.  The conjunctiva was scraped with a 15 blade.  A 4-0 Vicryl suture was then used to approximate the lateral tarsal strip to the lateral orbital rim periosteum and the sutures were left long. The lateral canthal angle was then reapproximated with a 4-0 Vicryl suture and then the lateral tarsal strip sutures were then tied down.  Excess skin was excised and  then the skin closed with 5-0 fast gut suture.  Attention was turned to the contralateral eye where the same procedures were performed.  Lesion excision Attention was then directed to the nasal dorsum.  A 6 mm dermal biopsy punch was then used to incise around a ulcerated lesion on the nasal dorsum.  The lesion and that was then excised with Wescott scissors.  This was submitted as a permanent section for pathology.  The lesion was then closed in an intermediate fashion with one 4-0 Vicryl suture interrupted deep followed by interrupted 5-0 fast gut absorbing suture.  Nasolacrimal duct probe with stent placement, right lower eyelid Attention was directed to the right lower eyelid.  A punctal dilator was used to dilate to the lacrimal punctum of the lower eyelid.  Bowman probes of increasing size from 00 to size 1 were then introduced and felt to pass to a hard stop.  There was mild resistance in the distal portion of this passage.  A mini [place]a stent was then trimmed to size and inserted into the right lower eyelid without difficulty.  The patient tolerated the procedure well and returned to the post anesthesia care unit in good condition.

## 2021-05-12 NOTE — Op Note (Incomplete Revision)
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     Nasolacrimal duct obstruction, right Postoperative Diagnosis: Same               Name of Operation:    Bilateral External levator advancement Ptosis Repair [phone]     Bilateral lower eyelid ectropion repair by lateral tarsal strip 08657     Lesion excision of face <1 cm 84696     Nasolacrimal duct probe with min-monka stent placement, right lower canaliculus   Surgeon:     Delia Chimes, MD, PhD        ANESTHESIA: [place] with local standard oculoplastic block    Lidocaine 2% with Epinephrine 1:100,000 / Marcaine 0.75% / Sodium     Bicarbonate 8.4%    Total volume used: 8 mL   Complications:   None    Blood Loss:     Minimal Specimen:    Nasal dorsal lesion Implant: Implant Name Type Inv. Item Serial No. Manufacturer Lot No. LRB No. Used Action  TUBE LACRIMAL MINI MONOKA 2X40 - [id number] Ophthalmic Related TUBE LACRIMAL MINI MONOKA 2X40  [employer] [id number] Right 1 Implanted           INDICATIONS:  The patient has ptosis of the eyelids causing visual impairment due to superior visual field defect that interferes with tasks of daily living including reading and computer use. The MRD1 is less than 66mm.  Informed consent had been provided prior to the day of surgery at the patients clinic visit. A handout was provided and the patient was asked to sign an eyelid informed consent specific to this procedure. All questions were answered.  The patient understands that the goal of surgery is to improve visual function.   The patient understands the risks of surgery include but are not limited to bleeding, infection, scar formation, asymmetry, the need for additional surgical intervention and other less common outcomes noted on the specific eyelid informed consent and anesthesia risks listed on the anesthesia consent.  The patient accepts the risks and desires to proceed with surgery as the position of the eyelids on the eyelashes is irritated and blocking the superior visual field interrupting tasks of daily living to include reading, driving and computer use.    Bilateral External PTOSIS REPAIR by levator advancement Attention was directed to the right upper eyelid.  The lid crease had been previously marked in the preoperative area.  A 15 blade was used to incise along the lid crease.  Dissection was carried down to the level of the orbital fat with monopolar cautery.  Septum was cleared of the levator aponeurosis and then an incision was made down to tarsus with monopolar cautery.  A 6-0 Vicryl suture was then used to engage the upper 2 to 3 mm of tarsus and then advance the levator aponeurosis in horizontal mattress fashion.  The patient was sat up to examine the lid height and contour, and once satisfactory the sutures were tied down.  The skin was then closed with 5-0 fast gut suture in running fashion.  Bilateral lower eyelid ectropion repair bilateral tarsal strip  Attention was then directed to the lateral canthus.  A lateral canthotomy was initiated with a 15 blade and then finished with Wescott scissors.  The inferior limb was then cut with Wescott scissors.  The anterior and posterior lamella were separated with Wescott  scissors.  The mucocutaneous junction was excised with Wescott scissors a 2 mm portion of the mucocutaneous junction of the right upper eyelid was then excised.  The conjunctiva was scraped with a 15 blade.  A 4-0 Vicryl suture was then used to approximate the lateral tarsal strip to the lateral orbital rim periosteum the sutures were left long the lateral canthal angle was then reapproximated with a 4-0 Vicryl suture and then the lateral tarsal strip sutures were then tied down.  Excess skin was excised and then the skin closed with 5-0 fast gut suture.  Attention was turned to the  contralateral eye where the same procedures were performed.  Lesion excision Attention was then directed to the nasal dorsum.  A 6 mm dermal biopsy punch was then used to incise around a ulcerated lesion on the nasal dorsum.  The lesion and that was then excised with Wescott scissors.  This was submitted as a permanent section for pathology.  The lesion was then closed in an intermediate fashion with one 4-0 Vicryl suture interrupted deep followed by interrupted 5-0 fast gut absorbing suture.  Nasolacrimal duct probe with stent placement, right lower eyelid Attention was directed to the right lower eyelid.  A punctal dilator was used to dilate to the lacrimal punctum of the lower eyelid.  Bowman probes of increasing size from 00 to size 1 were then introduced and felt to pass to a hard stop.  There was mild resistance in the distal portion of this passage.  A mini Minooka stent was then trimmed to size and inserted into the right lower eyelid without difficulty.  The patient tolerated the procedure well and returned to the post anesthesia care unit in good condition.

## 2021-05-12 NOTE — Transfer of Care (Signed)
Immediate Anesthesia Transfer of Care Note  Patient: [name]  Procedure(s) Performed: EXTERNAL PTOSIS REPAIR LEVATOR ADVANCEDMENT/RESECTION OF BILATERAL UPPER EYE LIDS (Bilateral: Eye) REPAIR OF ECTROPION BY LATERAL TARSAL STRIP OF BILATERAL LOWER EYE LIDS (Bilateral: Eye) RIGHT NASOLACRIMAL DUCT PROBE WITH MINI MONOKA STENT PLACEMENT (Right: Eye) NASAL LESION EXCISION (Nose)  Patient Location: PACU  Anesthesia Type:MAC  Level of Consciousness: awake, alert  and oriented  Airway & Oxygen Therapy: Patient Spontanous Breathing  Post-op Assessment: Report given to RN and Post -op Vital signs reviewed and stable  Post vital signs: Reviewed and stable  Last Vitals:  Vitals Value Taken Time  BP    Temp    Pulse 80 05/12/21 1011  Resp 36 05/12/21 1011  SpO2 90 % 05/12/21 1011  Vitals shown include unvalidated device data.  Last Pain:  Vitals:   05/12/21 8335  TempSrc:   PainSc: 0-No pain         Complications: No notable events documented.

## 2021-05-12 NOTE — Progress Notes (Signed)
Called and spoke to OR pharmacy about getting the lidocaine HCL ophthalmic drop and tetracaine ophthalmic drop for the patient's surgery. Made OR tech aware of 0730 start time.

## 2021-05-12 NOTE — Anesthesia Postprocedure Evaluation (Signed)
Anesthesia Post Note  Patient: [name]  Procedure(s) Performed: EXTERNAL PTOSIS REPAIR LEVATOR ADVANCEDMENT/RESECTION OF BILATERAL UPPER EYE LIDS (Bilateral: Eye) REPAIR OF ECTROPION BY LATERAL TARSAL STRIP OF BILATERAL LOWER EYE LIDS (Bilateral: Eye) RIGHT NASOLACRIMAL DUCT PROBE WITH MINI MONOKA STENT PLACEMENT (Right: Eye) NASAL LESION EXCISION (Nose)     Patient location during evaluation: PACU Anesthesia Type: MAC Level of consciousness: awake and alert Pain management: pain level controlled Vital Signs Assessment: post-procedure vital signs reviewed and stable Respiratory status: spontaneous breathing, nonlabored ventilation, respiratory function stable and patient connected to nasal cannula oxygen Cardiovascular status: stable and blood pressure returned to baseline Postop Assessment: no apparent nausea or vomiting Anesthetic complications: no   No notable events documented.  Last Vitals:  Vitals:   05/12/21 1012 05/12/21 1028  BP: 122/71 (!) 145/82  Pulse: 80 72  Resp: 17 15  Temp: 36.9 C 36.7 C  SpO2: 92% 95%    Last Pain:  Vitals:   05/12/21 1028  TempSrc:   PainSc: 1                  Itxel Wickard L Sondi Desch

## 2021-05-12 NOTE — Interval H&P Note (Signed)
History and Physical Interval Note:  05/12/2021 7:10 AM  [name]  has presented today for surgery, with the diagnosis of SENILE ECTROPION OF RIGHT AND LEFT LOWER EYE LIDS, PTOSIS OF RIGHT AND LEFT UPPER EYE LIDS, AQUIRED STENOSIS OF RIGHT NASOLACRIMAL DUCT AND LESION OF UNDETERMINED BEHAVIOR OF NASAL DORSUM.  The various methods of treatment have been discussed with the patient and family. After consideration of risks, benefits and other options for treatment, the patient has consented to  Procedure(s): EXTERNAL PTOSIS REPAIR LEVATOR ADVANCEDMENT/RESECTION OF BILATERAL UPPER EYE LIDS (Bilateral) REPAIR OF ECTROPION BY LATERAL TARSAL STRIP OF BILATERAL LOWER EYE LISDS (Bilateral) RIGHT NASOLACRIMAL DUCT PROBE WITH MINI MONOKA STENT PLACEMENT (Right)  LESION EXCISION WITH BIOPSY NASAL DORSUM a surgical intervention.  The patient's history has been reviewed, patient examined, no change in status, stable for surgery.  I have reviewed the patient's chart and labs.  Questions were answered to the patient's satisfaction.     Delia Chimes

## 2021-05-12 NOTE — Progress Notes (Signed)
Per CRNA report, pt is typically 88-90% on room air. Pt stated she has visited her PCP and they stated her lungs sound clear. This RN wanted Woodrum (anesthesiologist) to assess pt before DC. Educated pt on incentive spirometry and wearing PRN O2 that she has at home.

## 2021-05-12 NOTE — Anesthesia Procedure Notes (Addendum)
Procedure Name: MAC Date/Time: 05/12/2021 8:06 AM Performed by: Ardyth Harps, CRNA Pre-anesthesia Checklist: Patient identified, Emergency Drugs available, Suction available, Patient being monitored and Timeout performed Patient Re-evaluated:Patient Re-evaluated prior to induction Oxygen Delivery Method: Simple face mask Preoxygenation: Pre-oxygenation with 100% oxygen Induction Type: IV induction Placement Confirmation: positive ETCO2 and CO2 detector Dental Injury: Teeth and Oropharynx as per pre-operative assessment

## 2021-05-13 ENCOUNTER — Encounter (HOSPITAL_COMMUNITY): Payer: Self-pay | Admitting: Optometry

## 2021-05-19 LAB — SURGICAL PATHOLOGY

## 2021-05-24 DIAGNOSIS — R0989 Other specified symptoms and signs involving the circulatory and respiratory systems: Secondary | ICD-10-CM | POA: Diagnosis not present

## 2021-05-24 DIAGNOSIS — R051 Acute cough: Secondary | ICD-10-CM | POA: Diagnosis not present

## 2021-05-26 ENCOUNTER — Ambulatory Visit (INDEPENDENT_AMBULATORY_CARE_PROVIDER_SITE_OTHER): Payer: PPO

## 2021-05-26 ENCOUNTER — Other Ambulatory Visit: Payer: Self-pay

## 2021-05-26 ENCOUNTER — Ambulatory Visit: Payer: PPO | Admitting: Internal Medicine

## 2021-05-26 ENCOUNTER — Encounter: Payer: Self-pay | Admitting: Internal Medicine

## 2021-05-26 VITALS — BP 130/84 | HR 82 | Temp 98.1°F | Ht 66.0 in | Wt 168.2 lb

## 2021-05-26 DIAGNOSIS — J441 Chronic obstructive pulmonary disease with (acute) exacerbation: Secondary | ICD-10-CM | POA: Diagnosis not present

## 2021-05-26 DIAGNOSIS — G4734 Idiopathic sleep related nonobstructive alveolar hypoventilation: Secondary | ICD-10-CM

## 2021-05-26 MED ORDER — HYDROCOD POLST-CPM POLST ER 10-8 MG/5ML PO SUER: ORAL | 0 refills | Status: DC

## 2021-05-26 MED ORDER — CEFDINIR 300 MG PO CAPS: 300.0000 mg | ORAL_CAPSULE | Freq: Two times a day (BID) | ORAL | 0 refills | Status: AC

## 2021-05-26 MED ORDER — METHYLPREDNISOLONE ACETATE 80 MG/ML IJ SUSP
80.0000 mg | Freq: Once | INTRAMUSCULAR | Status: AC
Start: 2021-05-26 — End: 2021-05-26
  Administered 2021-05-26: 14:00:00 80 mg via INTRAMUSCULAR

## 2021-05-26 NOTE — Assessment & Plan Note (Signed)
She continues to use and benefit from oxygen 2-3 L for sleep

## 2021-05-26 NOTE — Progress Notes (Signed)
Patient ID: [name], female    DOB: Sep 27, 1940, 80 y.o.   MRN: [id number]  HPI female former smoker followed for COPD, allergic rhinitis, complicated by GERD, chronic back pain Office Spirometry 12/16/13- Severe obstructive airways disease, FVC 2.13/ 65%, FEV1 1.08/ 44%, FEV1/FVC 0.51, FEF25-75% 0.47/ 22%. History of intense reaction shortly after receiving pneumonia vaccine, leading to hospitalization. She is considered "allergic" and will not take pneumonia vaccine Office Spirometry 06/22/2016-severe obstructive airways disease. FVC 2.44/78%, FEV1 1.04/44%, ratio 0.43, FEF 25-75% 0.39/21%  ----------------------------------------------------------------   04/06/21- 80 year old female former smoker followed for COPD, Chronic Hypoxic Resp Failure,  allergic rhinitis, complicated by GERD, chronic back pain, PE 01/28/2019/  DVT(L)/, lower GI bleed> dc'd Eliquis, Covid pneumonia Jan 2022,  -Neb albuterol, albuterol hfa, Advair 250, Spiriva 2.5 Respimat,  O2 2-3L/ [facility] Covid vax-2 Phizer Flu vax-had ------C/o cough-yellow, wheezing, sob-same PCP Rx'd Zpak, steroid inj and prednisone taper. but sick x 2 weeks and not really better. They told her CXR "ok". Still wheeze and productive cough.  No fever or blood. Also intermittent epigastric cramping pain for which I asked her to contact her [facility]. She is scheduled for blepharoplasty with iv sedation on 11/30 and asks clearance. Asks "stronger antibiotic". Has tolerated cephalosporins in past.  CTa Chest PE 01/14/21- IMPRESSION: No definite evidence of pulmonary embolus. Moderate coronary artery calcifications are noted. Stable right upper and lower lobe subsegmental atelectasis or scarring is noted. Aortic Atherosclerosis (ICD10-I70.0).  05/26/21- 80 year old female former smoker followed for COPD, Chronic Hypoxic Resp Failure,  allergic rhinitis, complicated by GERD, chronic back pain, PE 01/28/2019/  DVT(L)/, lower GI bleed> dc'd  Eliquis, Covid pneumonia Jan 2022, CAD,  -Neb albuterol, albuterol hfa, Advair 250, Spiriva 2.5 Respimat,  O2 2-3L/ [facility] Covid vax-2 Phizer Flu vax-had -----Patient states her throat has been sore, productive cough with green/yellow sputum, laryngitis. Went to PCP and was given steroid injection and cough syrup. They did not do CXR. No fever Got cefdinir here Nov 9. Recovered from illness in November.  In the past week again 6-sweats, chills, cough productive scant yellow, hard coughing, weakness.  Her primary office gave Depo-Medrol.  She feels that was insufficient.  ROS-see HPI    + = positive Constitutional:   No-   weight loss, night sweats, fevers, chills, fatigue, lassitude. HEENT:   No-  headaches, difficulty swallowing, tooth/dental problems, sore throat,       No- sneezing, itching, ear ache,nasal congestion, +post nasal drip,  CV:  No-   chest pain, orthopnea, PND, swelling in lower extremities, anasarca, dizziness, palpitations Resp: + shortness of breath with exertion or at rest.              No-   productive cough,  + non-productive cough,  No- coughing up of blood.              No-   change in color of mucus.  +occasional mild wheezing.   Skin: No-   rash or lesions. GI:  No-   heartburn, indigestion, abdominal pain, nausea, vomiting,  GU:  MS:  No-   joint pain or swelling. + Acute/chronic back pain Neuro-     nothing unusual Psych:  No- change in mood or affect. No depression or anxiety.  No memory loss.  OBJ- Physical Exam General- Alert, Oriented, Affect-appropriate, Distress- none acute Skin- rash-none, lesions- none, excoriation- none Lymphadenopathy- none Head- atraumatic            Eyes- Gross vision  intact, PERRLA, conjunctivae and secretions clear            Ears- Hearing, canals-normal            Nose- Clear, no-Septal dev, mucus, polyps, erosion, perforation             Throat- Mallampati II , mucosa clear , drainage- none, tonsils- atrophic Neck-  flexible , trachea midline, no stridor , thyroid nl, carotid no bruit Chest - symmetrical excursion , unlabored           Heart/CV- RRR , no murmur , no gallop  , no rub, nl s1 s2                           - JVD- none , edema- none, stasis changes- none, varices- none           Lung-  +distant, unlabored, cough- none, wheeze-+, rhonchi-none  , dullness-none, rub- none           Chest wall-  Abd-  Br/ Gen/ Rectal- Not done, not indicated Extrem- cyanosis- none, clubbing, none, atrophy- none, strength- nl Neuro- +slight droop L eye.

## 2021-05-26 NOTE — Patient Instructions (Addendum)
Script sent for Cefdinir antibiotic and for Tussionex cough syrup  Order- depo 80  dx COPD exacerbation  Order- CXR    dx COPD exacerbation

## 2021-05-26 NOTE — Assessment & Plan Note (Signed)
We will treat this as COPD exacerbation, nonspecific. Plan-CXR, cefdinir, Tussionex, Depo-Medrol

## 2021-05-27 DIAGNOSIS — J449 Chronic obstructive pulmonary disease, unspecified: Secondary | ICD-10-CM | POA: Diagnosis not present

## 2021-06-03 DIAGNOSIS — K8010 Calculus of gallbladder with chronic cholecystitis without obstruction: Secondary | ICD-10-CM | POA: Diagnosis not present

## 2021-06-14 DIAGNOSIS — H02402 Unspecified ptosis of left eyelid: Secondary | ICD-10-CM | POA: Diagnosis not present

## 2021-06-14 DIAGNOSIS — H02403 Unspecified ptosis of bilateral eyelids: Secondary | ICD-10-CM | POA: Diagnosis not present

## 2021-06-14 DIAGNOSIS — H02831 Dermatochalasis of right upper eyelid: Secondary | ICD-10-CM | POA: Diagnosis not present

## 2021-06-26 IMAGING — DX DG CHEST 1V PORT
1 series · 1 of 1 positions shown · non-contrast
Comparison: Radiographs December 26, 2018.

CLINICAL DATA: Shortness of breath.

EXAM:
PORTABLE CHEST 1 VIEW

[chest ap]
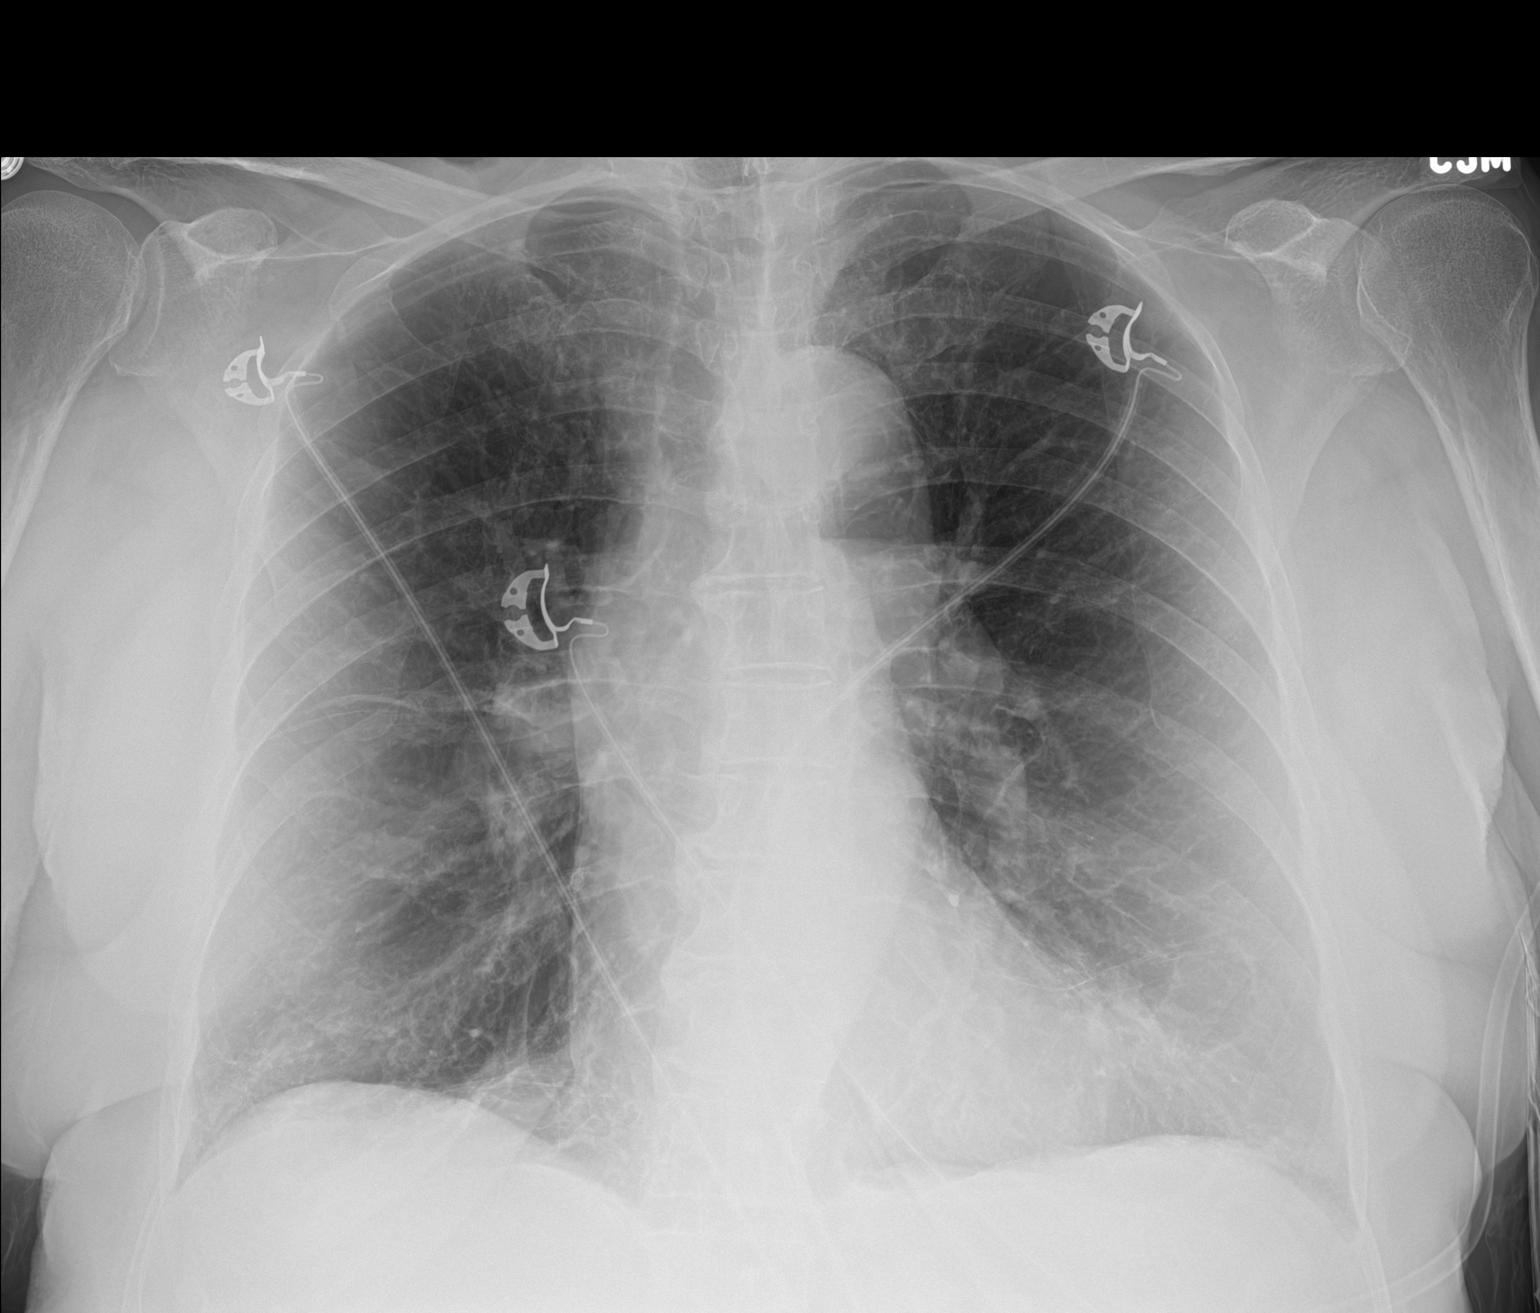

[1 of 1 positions shown; findings below may reference images not displayed]

FINDINGS: Stable cardiomediastinal silhouette. No pneumothorax or pleural
effusion is noted. Both lungs are clear. The visualized skeletal
structures are unremarkable.
IMPRESSION: No active disease.

## 2021-06-27 DIAGNOSIS — J449 Chronic obstructive pulmonary disease, unspecified: Secondary | ICD-10-CM | POA: Diagnosis not present

## 2021-07-21 DIAGNOSIS — J019 Acute sinusitis, unspecified: Secondary | ICD-10-CM | POA: Diagnosis not present

## 2021-07-21 DIAGNOSIS — J441 Chronic obstructive pulmonary disease with (acute) exacerbation: Secondary | ICD-10-CM | POA: Diagnosis not present

## 2021-07-22 NOTE — Progress Notes (Signed)
Sent message, via epic in basket, requesting orders in epic from surgeon.  

## 2021-07-25 ENCOUNTER — Ambulatory Visit: Payer: Self-pay | Admitting: Surgery

## 2021-07-26 DIAGNOSIS — J449 Chronic obstructive pulmonary disease, unspecified: Secondary | ICD-10-CM | POA: Diagnosis not present

## 2021-08-01 NOTE — Patient Instructions (Signed)
DUE TO COVID-19 ONLY ONE VISITOR IS ALLOWED TO COME WITH YOU AND STAY IN THE WAITING ROOM ONLY DURING PRE OP AND PROCEDURE.   ?**NO VISITORS ARE ALLOWED IN THE SHORT STAY AREA OR RECOVERY ROOM!!** ? ?IF YOU WILL BE ADMITTED INTO THE HOSPITAL YOU ARE ALLOWED ONLY TWO SUPPORT PEOPLE DURING VISITATION HOURS ONLY (7 AM -8PM)   ?The support person(s) must pass our screening, gel in and out, and wear a mask at all times, including in the patient?s room. ?Patients must also wear a mask when staff or their support person are in the room. ?Visitors GUEST BADGE MUST BE WORN VISIBLY  ?One adult visitor may remain with you overnight and MUST be in the room by 8 P.M. ? ?No visitors under the age of 76. Any visitor under the age of 68 must be accompanied by an adult.  ?     ? Your procedure is scheduled on: 08/12/21 ? ? Report to [facility] Main Entrance ? ?  Report to Short stay at: 5:15 AM ? ? Call this number if you have problems the morning of surgery [phone] ? ? Do not eat food :After Midnight. ? ? After Midnight you may have the following liquids until: 4:30 AM  DAY OF SURGERY ? ?Water ?Black Coffee (sugar ok, NO MILK/CREAM OR CREAMERS)  ?Tea (sugar ok, NO MILK/CREAM OR CREAMERS) regular and decaf                             ?Plain Jell-O (NO RED)                                           ?Fruit ices (not with fruit pulp, NO RED)                                     ?Popsicles (NO RED)                                                                  ?Juice: apple, WHITE grape, WHITE cranberry ?Sports drinks like Gatorade (NO RED) ?Clear broth(vegetable,chicken,beef) ? ?FOLLOW BOWEL PREP AND ANY ADDITIONAL PRE OP INSTRUCTIONS YOU RECEIVED FROM YOUR SURGEON'S OFFICE!!! ?  ?Oral Hygiene is also important to reduce your risk of infection.                                    ?Remember - BRUSH YOUR TEETH THE MORNING OF SURGERY WITH YOUR REGULAR TOOTHPASTE ? ? Do NOT smoke after Midnight ? ? Take these medicines the  morning of surgery with A SIP OF WATER: escitalopram,cetirizine,omeprazole.Use inhalers as usual. ? ?DO NOT TAKE ANY ORAL DIABETIC MEDICATIONS DAY OF YOUR SURGERY ? ?Bring CPAP mask and tubing day of surgery. ?                  ?           You may not have any metal on  your body including hair pins, jewelry, and body piercing ? ?           Do not wear make-up, lotions, powders, perfumes/cologne, or deodorant ? ?Do not wear nail polish including gel and S&S, artificial/acrylic nails, or any other type of covering on natural nails including finger and toenails. If you have artificial nails, gel coating, etc. that needs to be removed by a nail salon please have this removed prior to surgery or surgery may need to be canceled/ delayed if the surgeon/ anesthesia feels like they are unable to be safely monitored.  ? ?Do not shave  48 hours prior to surgery.  ? ? Do not bring valuables to the hospital. [facility] NOT ?            RESPONSIBLE   FOR VALUABLES. ? ? Contacts, dentures or bridgework may not be worn into surgery. ? ? Bring small overnight bag day of surgery. ?  ? Patients discharged on the day of surgery will not be allowed to drive home.  Someone NEEDS to stay with you for the first 24 hours after anesthesia. ? ? Special Instructions: Bring a copy of your healthcare power of attorney and living will documents         the day of surgery if you haven't scanned them before. ? ?            Please read over the following fact sheets you were given: IF [place] [phone] ? ?   [facility] - Preparing for Surgery ?Before surgery, you can play an important role.  Because skin is not sterile, your skin needs to be as free of germs as possible.  You can reduce the number of germs on your skin by washing with CHG (chlorahexidine gluconate) soap before surgery.  CHG is an antiseptic cleaner which kills germs and bonds with the skin to continue killing germs even  after washing. ?Please DO NOT use if you have an allergy to CHG or antibacterial soaps.  If your skin becomes reddened/irritated stop using the CHG and inform your nurse when you arrive at Short Stay. ?Do not shave (including legs and underarms) for at least 48 hours prior to the first CHG shower.  You may shave your face/neck. ?Please follow these instructions carefully: ? 1.  Shower with CHG Soap the night before surgery and the  morning of Surgery. ? 2.  If you choose to wash your hair, wash your hair first as usual with your  normal  shampoo. ? 3.  After you shampoo, rinse your hair and body thoroughly to remove the  shampoo.                           4.  Use CHG as you would any other liquid soap.  You can apply chg directly  to the skin and wash  ?                     Gently with a scrungie or clean washcloth. ? 5.  Apply the CHG Soap to your body ONLY FROM THE NECK DOWN.   Do not use on face/ open      ?                     Wound or open sores. Avoid contact with eyes, ears mouth and genitals (private parts).  ?  Wash face,  Genitals (private parts) with your normal soap. ?            6.  Wash thoroughly, paying special attention to the area where your surgery  will be performed. ? 7.  Thoroughly rinse your body with warm water from the neck down. ? 8.  DO NOT shower/wash with your normal soap after using and rinsing off  the CHG Soap. ?               9.  Pat yourself dry with a clean towel. ?           10.  Wear clean pajamas. ?           11.  Place clean sheets on your bed the night of your first shower and do not  sleep with pets. ?Day of Surgery : ?Do not apply any lotions/deodorants the morning of surgery.  Please wear clean clothes to the hospital/surgery center. ? ?FAILURE TO FOLLOW THESE INSTRUCTIONS MAY RESULT IN THE CANCELLATION OF YOUR SURGERY ?PATIENT SIGNATURE_________________________________ ? ?NURSE  SIGNATURE__________________________________ ? ?________________________________________________________________________  ?

## 2021-08-02 ENCOUNTER — Encounter (HOSPITAL_COMMUNITY): Payer: Self-pay

## 2021-08-02 ENCOUNTER — Other Ambulatory Visit: Payer: Self-pay

## 2021-08-02 ENCOUNTER — Encounter (HOSPITAL_COMMUNITY)
Admission: RE | Admit: 2021-08-02 | Discharge: 2021-08-02 | Disposition: A | Discharge status: Home / Self Care | Payer: PPO | Source: Ambulatory Visit | Attending: Surgery | Admitting: Surgery

## 2021-08-02 VITALS — BP 143/91 | HR 81 | Temp 98.4°F | Resp 18 | Ht 66.0 in | Wt 166.0 lb

## 2021-08-02 DIAGNOSIS — I10 Essential (primary) hypertension: Secondary | ICD-10-CM | POA: Diagnosis not present

## 2021-08-02 DIAGNOSIS — Z01812 Encounter for preprocedural laboratory examination: Secondary | ICD-10-CM | POA: Insufficient documentation

## 2021-08-02 HISTORY — DX: Dyspnea, unspecified: R06.00

## 2021-08-02 HISTORY — DX: Anxiety disorder, unspecified: F41.9

## 2021-08-02 LAB — BASIC METABOLIC PANEL
Anion gap: 6 (ref 5–15)
BUN: 29 mg/dL — ABNORMAL HIGH (ref 8–23)
CO2: 29 mmol/L (ref 22–32)
Calcium: 9 mg/dL (ref 8.9–10.3)
Chloride: 100 mmol/L (ref 98–111)
Creatinine, Ser: 1.06 mg/dL — ABNORMAL HIGH (ref 0.44–1.00)
GFR, Estimated: 54 mL/min — ABNORMAL LOW (ref >60)
Glucose, Bld: 95 mg/dL (ref 70–99)
Potassium: 4.3 mmol/L (ref 3.5–5.1)
Sodium: 135 mmol/L (ref 135–145)

## 2021-08-02 LAB — CBC
HCT: 53.8 % — ABNORMAL HIGH (ref 36.0–46.0)
Hemoglobin: 17.1 g/dL — ABNORMAL HIGH (ref 12.0–15.0)
MCH: 30.3 pg (ref 26.0–34.0)
MCHC: 31.8 g/dL (ref 30.0–36.0)
MCV: 95.2 fL (ref 80.0–100.0)
Platelets: 442 10*3/uL — ABNORMAL HIGH (ref 150–400)
RBC: 5.65 MIL/uL — ABNORMAL HIGH (ref 3.87–5.11)
RDW: 13.9 % (ref 11.5–15.5)
WBC: 10.7 10*3/uL — ABNORMAL HIGH (ref 4.0–10.5)
nRBC: 0 % (ref 0.0–0.2)

## 2021-08-02 NOTE — Progress Notes (Signed)
Lab results: HCT: 53.8 ?

## 2021-08-02 NOTE — Progress Notes (Addendum)
For Short Stay: ?[facility] appointment date: N/A ?Date of COVID positive in last 90 days: N/A ?COVID TVaccine:Pfizer x 2: 10/06/19 ?Bowel Prep reminder: ? ? ?For Anesthesia: ?PCP - [name]: [name] ?Cardiologist - NO ? ?Chest x-ray - 05/27/21 ?EKG - 01/04/21 ?Stress Test -  ?ECHO - 01/28/19 ?Cardiac Cath - 05/26/19 ?Pacemaker/ICD device last checked: ?Pacemaker orders received: ?Device Rep notified: ? ?Spinal Cord Stimulator: ? ?Sleep Study -  ?CPAP -  ? ?Fasting Blood Sugar -  ?Checks Blood Sugar _____ times a day ?Date and result of last Hgb A1c- ? ?Blood Thinner Instructions: ?Aspirin Instructions: ?Last Dose: ? ?Activity level: Can go up a flight of stairs and activities of daily living without stopping and without chest pain and/or shortness of breath ?  Able to exercise without chest pain and/or shortness of breath ?  Unable to go up a flight of stairs without chest pain and/or shortness of breath ?   ? ?Anesthesia review: Hx: HTN,DVT,PE,COPD ? ?Patient denies shortness of breath, fever, cough and chest pain at PAT appointment ? ? ?Patient verbalized understanding of instructions that were given to them at the PAT appointment. Patient was also instructed that they will need to review over the PAT instructions again at home before surgery.  ?

## 2021-08-11 NOTE — Anesthesia Preprocedure Evaluation (Addendum)
Anesthesia Evaluation  ?Patient identified by MRN, date of birth, ID band ?Patient awake ? ? ? ?Reviewed: ?Allergy & Precautions, NPO status , Patient's Chart, lab work & pertinent test results ? ?Airway ?Mallampati: II ? ?TM Distance: >3 FB ?Neck ROM: Full ? ? ? Dental ?no notable dental hx. ?(+) Partial Upper, Partial Lower,  ?  ?Pulmonary ?asthma , COPD, former smoker, PE ?  ?Pulmonary exam normal ?breath sounds clear to auscultation ? ? ? ? ? ? Cardiovascular ?hypertension, Pt. on medications ?+ DVT  ?Normal cardiovascular exam ?Rhythm:Regular Rate:Normal ? ?01/2019 Echo ??1. The left ventricle has normal systolic function, with an ejection  ?fraction of 55-60%. The cavity size was normal. Left ventricular diastolic  ?parameters were normal. No evidence of left ventricular regional wall  ?motion abnormalities.  ??2. The right ventricle has low normal systolic function. The cavity was  ?normal. There is no increase in right ventricular wall thickness. Right  ?ventricular systolic pressure is mildly elevated with an estimated  ?pressure of 33.7 mmHg.  ??3. The aorta is normal unless otherwise noted.  ??4. The interatrial septum was not assessed.  ?  ?Neuro/Psych ?Depression   ? GI/Hepatic ?Neg liver ROS, GERD  Medicated,  ?Endo/Other  ? ? Renal/GU ?negative Renal ROSLab Results ?     Component                Value               Date                 ?     CREATININE               1.06 (H)            08/02/2021           ?     K                        4.3                 08/02/2021           ?     ?  ? ?  ?Musculoskeletal ? ? Abdominal ?  ?Peds ? Hematology ?Lab Results ?     Component                Value               Date                    ?     HGB                      17.1 (H)            08/02/2021           ?     HCT                      53.8 (H)            08/02/2021           ?     PLT                      442 (H)             08/02/2021           ?   ?  Anesthesia Other  Findings ?All: PCN , Clarithromycin, Sulfa ? Reproductive/Obstetrics ? ?  ? ? ? ? ? ? ? ? ? ? ? ? ? ?  ?  ? ? ? ? ? ? ? ?Anesthesia Physical ?Anesthesia Plan ? ?ASA: 3 ? ?Anesthesia Plan: General  ? ?Post-op Pain Management: Tylenol PO (pre-op)* and Toradol IV (intra-op)*  ? ?Induction: Intravenous ? ?PONV Risk Score and Plan: 3 and Treatment may vary due to age or medical condition, Dexamethasone and Ondansetron ? ?Airway Management Planned: Oral ETT ? ?Additional Equipment: None ? ?Intra-op Plan:  ? ?Post-operative Plan: Extubation in OR ? ?Informed Consent: I have reviewed the patients History and Physical, chart, labs and discussed the procedure including the risks, benefits and alternatives for the proposed anesthesia with the patient or authorized representative who has indicated his/her understanding and acceptance.  ? ? ? ?Dental advisory given ? ?Plan Discussed with: CRNA and Anesthesiologist ? ?Anesthesia Plan Comments:   ? ? ? ? ? ?Anesthesia Quick Evaluation ? ?

## 2021-08-12 ENCOUNTER — Other Ambulatory Visit: Payer: Self-pay

## 2021-08-12 ENCOUNTER — Ambulatory Visit (HOSPITAL_BASED_OUTPATIENT_CLINIC_OR_DEPARTMENT_OTHER): Payer: PPO | Admitting: Anesthesiology

## 2021-08-12 ENCOUNTER — Ambulatory Visit (HOSPITAL_COMMUNITY)
Admission: RE | Admit: 2021-08-12 | Discharge: 2021-08-12 | Disposition: A | Discharge status: Home / Self Care | Payer: PPO | Source: Ambulatory Visit | Attending: Surgery | Admitting: Surgery

## 2021-08-12 ENCOUNTER — Ambulatory Visit (HOSPITAL_COMMUNITY): Payer: PPO | Admitting: Physician Assistant

## 2021-08-12 ENCOUNTER — Encounter (HOSPITAL_COMMUNITY): Payer: Self-pay | Admitting: Surgery

## 2021-08-12 ENCOUNTER — Encounter (HOSPITAL_COMMUNITY)
Admission: RE | Disposition: A | Discharge status: Home / Self Care | Payer: Self-pay | Source: Ambulatory Visit | Attending: Surgery

## 2021-08-12 DIAGNOSIS — I10 Essential (primary) hypertension: Secondary | ICD-10-CM | POA: Diagnosis not present

## 2021-08-12 DIAGNOSIS — Z79899 Other long term (current) drug therapy: Secondary | ICD-10-CM | POA: Insufficient documentation

## 2021-08-12 DIAGNOSIS — Z87891 Personal history of nicotine dependence: Secondary | ICD-10-CM | POA: Diagnosis not present

## 2021-08-12 DIAGNOSIS — K8020 Calculus of gallbladder without cholecystitis without obstruction: Secondary | ICD-10-CM | POA: Insufficient documentation

## 2021-08-12 DIAGNOSIS — Z7951 Long term (current) use of inhaled steroids: Secondary | ICD-10-CM | POA: Diagnosis not present

## 2021-08-12 DIAGNOSIS — Z86718 Personal history of other venous thrombosis and embolism: Secondary | ICD-10-CM | POA: Diagnosis not present

## 2021-08-12 DIAGNOSIS — K219 Gastro-esophageal reflux disease without esophagitis: Secondary | ICD-10-CM | POA: Insufficient documentation

## 2021-08-12 DIAGNOSIS — J449 Chronic obstructive pulmonary disease, unspecified: Secondary | ICD-10-CM | POA: Diagnosis not present

## 2021-08-12 DIAGNOSIS — I82409 Acute embolism and thrombosis of unspecified deep veins of unspecified lower extremity: Secondary | ICD-10-CM | POA: Diagnosis not present

## 2021-08-12 DIAGNOSIS — F32A Depression, unspecified: Secondary | ICD-10-CM | POA: Diagnosis not present

## 2021-08-12 DIAGNOSIS — Z86711 Personal history of pulmonary embolism: Secondary | ICD-10-CM | POA: Diagnosis not present

## 2021-08-12 HISTORY — PX: CHOLECYSTECTOMY: SHX55

## 2021-08-12 SURGERY — LAPAROSCOPIC CHOLECYSTECTOMY
Anesthesia: General | Site: Abdomen

## 2021-08-12 MED ORDER — BUPIVACAINE-EPINEPHRINE (PF) 0.25% -1:200000 IJ SOLN
INTRAMUSCULAR | Status: AC
  Filled 2021-08-12: qty 30

## 2021-08-12 MED ORDER — ONDANSETRON HCL 4 MG/2ML IJ SOLN
INTRAMUSCULAR | Status: DC | PRN
Start: 2021-08-12 — End: 2021-08-12
  Administered 2021-08-12: 4 mg via INTRAVENOUS

## 2021-08-12 MED ORDER — ACETAMINOPHEN 10 MG/ML IV SOLN: 1000.0000 mg | Freq: Once | INTRAVENOUS | Status: DC | PRN

## 2021-08-12 MED ORDER — MIDAZOLAM HCL 2 MG/2ML IJ SOLN
INTRAMUSCULAR | Status: AC
  Filled 2021-08-12: qty 2

## 2021-08-12 MED ORDER — OXYCODONE-ACETAMINOPHEN 5-325 MG PO TABS: 1.0000 | ORAL_TABLET | ORAL | 0 refills | Status: DC | PRN

## 2021-08-12 MED ORDER — ONDANSETRON HCL 4 MG/2ML IJ SOLN: 4.0000 mg | Freq: Once | INTRAMUSCULAR | Status: DC | PRN

## 2021-08-12 MED ORDER — PROPOFOL 10 MG/ML IV BOLUS
INTRAVENOUS | Status: DC | PRN
  Administered 2021-08-12: 150 mg via INTRAVENOUS

## 2021-08-12 MED ORDER — FENTANYL CITRATE (PF) 100 MCG/2ML IJ SOLN
INTRAMUSCULAR | Status: DC | PRN
  Administered 2021-08-12 (×3): 50 ug via INTRAVENOUS

## 2021-08-12 MED ORDER — ALBUTEROL SULFATE (2.5 MG/3ML) 0.083% IN NEBU
INHALATION_SOLUTION | RESPIRATORY_TRACT | Status: AC
  Filled 2021-08-12: qty 3

## 2021-08-12 MED ORDER — LACTATED RINGERS IR SOLN
Status: DC | PRN
  Administered 2021-08-12: 1000 mL

## 2021-08-12 MED ORDER — CHLORHEXIDINE GLUCONATE CLOTH 2 % EX PADS: 6.0000 | MEDICATED_PAD | Freq: Once | CUTANEOUS | Status: DC

## 2021-08-12 MED ORDER — ALBUTEROL SULFATE (2.5 MG/3ML) 0.083% IN NEBU
2.5000 mg | INHALATION_SOLUTION | Freq: Four times a day (QID) | RESPIRATORY_TRACT | Status: DC | PRN
  Administered 2021-08-12: 2.5 mg via RESPIRATORY_TRACT

## 2021-08-12 MED ORDER — LIDOCAINE HCL (PF) 2 % IJ SOLN
INTRAMUSCULAR | Status: AC
  Filled 2021-08-12: qty 5

## 2021-08-12 MED ORDER — LACTATED RINGERS IV SOLN: INTRAVENOUS | Status: DC

## 2021-08-12 MED ORDER — BUPIVACAINE LIPOSOME 1.3 % IJ SUSP
20.0000 mL | Freq: Once | INTRAMUSCULAR | Status: DC
Start: 2021-08-12 — End: 2021-08-12

## 2021-08-12 MED ORDER — FENTANYL CITRATE (PF) 100 MCG/2ML IJ SOLN
INTRAMUSCULAR | Status: AC
  Filled 2021-08-12: qty 2

## 2021-08-12 MED ORDER — ENOXAPARIN SODIUM 40 MG/0.4ML IJ SOSY
40.0000 mg | PREFILLED_SYRINGE | Freq: Once | INTRAMUSCULAR | Status: AC
  Administered 2021-08-12: 40 mg via SUBCUTANEOUS
  Filled 2021-08-12: qty 0.4

## 2021-08-12 MED ORDER — MIDAZOLAM HCL 5 MG/5ML IJ SOLN
INTRAMUSCULAR | Status: DC | PRN
Start: 2021-08-12 — End: 2021-08-12
  Administered 2021-08-12 (×2): 1 mg via INTRAVENOUS

## 2021-08-12 MED ORDER — PHENYLEPHRINE 40 MCG/ML (10ML) SYRINGE FOR IV PUSH (FOR BLOOD PRESSURE SUPPORT)
PREFILLED_SYRINGE | INTRAVENOUS | Status: AC
  Filled 2021-08-12: qty 10

## 2021-08-12 MED ORDER — CLINDAMYCIN PHOSPHATE 900 MG/50ML IV SOLN
900.0000 mg | INTRAVENOUS | Status: AC
  Administered 2021-08-12: 900 mg via INTRAVENOUS
  Filled 2021-08-12: qty 50

## 2021-08-12 MED ORDER — CHLORHEXIDINE GLUCONATE 0.12 % MT SOLN
15.0000 mL | Freq: Once | OROMUCOSAL | Status: AC
  Administered 2021-08-12: 15 mL via OROMUCOSAL

## 2021-08-12 MED ORDER — FENTANYL CITRATE PF 50 MCG/ML IJ SOSY
PREFILLED_SYRINGE | INTRAMUSCULAR | Status: AC
  Filled 2021-08-12: qty 1

## 2021-08-12 MED ORDER — GABAPENTIN 300 MG PO CAPS
300.0000 mg | ORAL_CAPSULE | ORAL | Status: AC
  Administered 2021-08-12: 300 mg via ORAL
  Filled 2021-08-12: qty 1

## 2021-08-12 MED ORDER — DEXAMETHASONE SODIUM PHOSPHATE 4 MG/ML IJ SOLN
INTRAMUSCULAR | Status: DC | PRN
Start: 2021-08-12 — End: 2021-08-12
  Administered 2021-08-12: 8 mg via INTRAVENOUS

## 2021-08-12 MED ORDER — LIDOCAINE HCL (CARDIAC) PF 100 MG/5ML IV SOSY
PREFILLED_SYRINGE | INTRAVENOUS | Status: DC | PRN
Start: 2021-08-12 — End: 2021-08-12
  Administered 2021-08-12: 60 mg via INTRAVENOUS

## 2021-08-12 MED ORDER — SUGAMMADEX SODIUM 200 MG/2ML IV SOLN
INTRAVENOUS | Status: DC | PRN
Start: 2021-08-12 — End: 2021-08-12
  Administered 2021-08-12: 200 mg via INTRAVENOUS

## 2021-08-12 MED ORDER — ONDANSETRON HCL 4 MG/2ML IJ SOLN
INTRAMUSCULAR | Status: AC
  Filled 2021-08-12: qty 2

## 2021-08-12 MED ORDER — ACETAMINOPHEN 500 MG PO TABS
1000.0000 mg | ORAL_TABLET | ORAL | Status: AC
  Administered 2021-08-12: 1000 mg via ORAL
  Filled 2021-08-12: qty 2

## 2021-08-12 MED ORDER — BUPIVACAINE-EPINEPHRINE 0.25% -1:200000 IJ SOLN
INTRAMUSCULAR | Status: DC | PRN
  Administered 2021-08-12: 30 mL

## 2021-08-12 MED ORDER — ROCURONIUM BROMIDE 10 MG/ML (PF) SYRINGE
PREFILLED_SYRINGE | INTRAVENOUS | Status: DC | PRN
  Administered 2021-08-12: 50 mg via INTRAVENOUS

## 2021-08-12 MED ORDER — DEXAMETHASONE SODIUM PHOSPHATE 10 MG/ML IJ SOLN
INTRAMUSCULAR | Status: AC
  Filled 2021-08-12: qty 1

## 2021-08-12 MED ORDER — FENTANYL CITRATE PF 50 MCG/ML IJ SOSY
25.0000 ug | PREFILLED_SYRINGE | INTRAMUSCULAR | Status: DC | PRN
  Administered 2021-08-12 (×2): 25 ug via INTRAVENOUS

## 2021-08-12 MED ORDER — PHENYLEPHRINE 40 MCG/ML (10ML) SYRINGE FOR IV PUSH (FOR BLOOD PRESSURE SUPPORT)
PREFILLED_SYRINGE | INTRAVENOUS | Status: DC | PRN
  Administered 2021-08-12: 80 ug via INTRAVENOUS

## 2021-08-12 MED ORDER — 0.9 % SODIUM CHLORIDE (POUR BTL) OPTIME
TOPICAL | Status: DC | PRN
  Administered 2021-08-12: 1000 mL

## 2021-08-12 MED ORDER — ORAL CARE MOUTH RINSE: 15.0000 mL | Freq: Once | OROMUCOSAL | Status: AC

## 2021-08-12 MED ORDER — PROPOFOL 10 MG/ML IV BOLUS
INTRAVENOUS | Status: AC
  Filled 2021-08-12: qty 20

## 2021-08-12 SURGICAL SUPPLY — 48 items
ADH SKN CLS APL DERMABOND .7 (GAUZE/BANDAGES/DRESSINGS) ×1
APL PRP STRL LF DISP 70% ISPRP (MISCELLANEOUS) ×1
APPLIER CLIP ROT 10 11.4 M/L (STAPLE) ×2
APR CLP MED LRG 11.4X10 (STAPLE) ×1
BAG COUNTER SPONGE SURGICOUNT (BAG) IMPLANT
BAG SPEC RTRVL 10 TROC 200 (ENDOMECHANICALS) ×1
BAG SPNG CNTER NS LX DISP (BAG)
CABLE HIGH FREQUENCY MONO STRZ (ELECTRODE) ×3 IMPLANT
CATH URETL OPEN 5X70 (CATHETERS) IMPLANT
CHLORAPREP W/TINT 26 (MISCELLANEOUS) ×3 IMPLANT
CLIP APPLIE ROT 10 11.4 M/L (STAPLE) ×2 IMPLANT
COVER MAYO STAND XLG (MISCELLANEOUS) ×3 IMPLANT
COVER SURGICAL LIGHT HANDLE (MISCELLANEOUS) ×3 IMPLANT
DERMABOND ADVANCED (GAUZE/BANDAGES/DRESSINGS) ×1
DERMABOND ADVANCED .7 DNX12 (GAUZE/BANDAGES/DRESSINGS) ×2 IMPLANT
DRAPE C-ARM 42X120 X-RAY (DRAPES) IMPLANT
ELECT REM PT RETURN 15FT ADLT (MISCELLANEOUS) ×3 IMPLANT
ENDOLOOP SUT PDS II  0 18 (SUTURE) ×2
ENDOLOOP SUT PDS II 0 18 (SUTURE) ×2 IMPLANT
GAUZE 4X4 16PLY ~~LOC~~+RFID DBL (SPONGE) ×1 IMPLANT
GLOVE SRG 8 PF TXTR STRL LF DI (GLOVE) ×2 IMPLANT
GLOVE SURG ENC MOIS LTX SZ7.5 (GLOVE) ×3 IMPLANT
GLOVE SURG UNDER POLY LF SZ8 (GLOVE) ×2
GOWN STRL REUS W/ TWL XL LVL3 (GOWN DISPOSABLE) ×4 IMPLANT
GOWN STRL REUS W/TWL XL LVL3 (GOWN DISPOSABLE) ×4
GRASPER SUT TROCAR 14GX15 (MISCELLANEOUS) ×1 IMPLANT
HEMOSTAT SNOW SURGICEL 2X4 (HEMOSTASIS) IMPLANT
IRRIG SUCT STRYKERFLOW 2 WTIP (MISCELLANEOUS) ×2
IRRIGATION SUCT STRKRFLW 2 WTP (MISCELLANEOUS) ×2 IMPLANT
IV CATH 14GX2 1/4 (CATHETERS) ×3 IMPLANT
KIT BASIN OR (CUSTOM PROCEDURE TRAY) ×3 IMPLANT
KIT TURNOVER KIT A (KITS) ×1 IMPLANT
NDL INSUFFLATION 14GA 120MM (NEEDLE) ×2 IMPLANT
NEEDLE INSUFFLATION 14GA 120MM (NEEDLE) ×2 IMPLANT
PENCIL SMOKE EVACUATOR (MISCELLANEOUS) IMPLANT
POUCH RETRIEVAL ECOSAC 10 (ENDOMECHANICALS) ×2 IMPLANT
POUCH RETRIEVAL ECOSAC 10MM (ENDOMECHANICALS) ×2
SCISSORS LAP 5X35 DISP (ENDOMECHANICALS) ×3 IMPLANT
SET TUBE SMOKE EVAC HIGH FLOW (TUBING) ×3 IMPLANT
SLEEVE XCEL OPT CAN 5 100 (ENDOMECHANICALS) ×6 IMPLANT
SPIKE FLUID TRANSFER (MISCELLANEOUS) ×3 IMPLANT
STOPCOCK 4 WAY LG BORE MALE ST (IV SETS) IMPLANT
SUT MNCRL AB 4-0 PS2 18 (SUTURE) ×3 IMPLANT
TOWEL OR 17X26 10 PK STRL BLUE (TOWEL DISPOSABLE) ×3 IMPLANT
TOWEL OR NON WOVEN STRL DISP B (DISPOSABLE) IMPLANT
TRAY LAPAROSCOPIC (CUSTOM PROCEDURE TRAY) ×3 IMPLANT
TROCAR BLADELESS OPT 5 100 (ENDOMECHANICALS) ×4 IMPLANT
TROCAR XCEL 12X100 BLDLESS (ENDOMECHANICALS) ×3 IMPLANT

## 2021-08-12 NOTE — Transfer of Care (Signed)
Immediate Anesthesia Transfer of Care Note ? ?Patient: [name] ? ?Procedure(s) Performed: LAPAROSCOPIC CHOLECYSTECTOMY (Abdomen) ? ?Patient Location: PACU ? ?Anesthesia Type:General ? ?Level of Consciousness: awake ? ?Airway & Oxygen Therapy: Patient Spontanous Breathing and Patient connected to face mask oxygen ? ?Post-op Assessment: Report given to RN and Post -op Vital signs reviewed and stable ? ?Post vital signs: Reviewed and stable ? ?Last Vitals:  ?Vitals Value Taken Time  ?BP 173/94 08/12/21 0853  ?Temp    ?Pulse 97 08/12/21 0854  ?Resp 21 08/12/21 0854  ?SpO2 94 % 08/12/21 0854  ?Vitals shown include unvalidated device data. ? ?Last Pain:  ?Vitals:  ? 08/12/21 0553  ?TempSrc: Oral  ?PainSc:   ?   ? ?Patients Stated Pain Goal: 4 (08/12/21 0551) ? ?Complications: No notable events documented. ?

## 2021-08-12 NOTE — Discharge Instructions (Addendum)
 CHOLECYSTECTOMY POST OPERATIVE INSTRUCTIONS  Thinking Clearly  The anesthesia may cause you to feel different for 1 or 2 days. Do not drive, drink alcohol, or make any big decisions for at least 2 days.  Nutrition When you wake up, you will be able to drink small amounts of liquid. If you do not feel sick, you can slowly advance your diet to regular foods. Continue to drink lots of fluids, usually about 8 to 10 glasses per day. Eat a high-fiber diet so you don't strain during bowel movements. High-Fiber Foods Foods high in fiber include beans, bran cereals and whole-grain breads, peas, dried fruit (figs, apricots, and dates), raspberries, blackberries, strawberries, sweet corn, broccoli, baked potatoes with skin, plums, pears, apples, greens, and nuts. Activity Slowly increase your activity. Be sure to get up and walk every hour or so to prevent blood clots. No heavy lifting or strenuous activity for 4 weeks following surgery to prevent hernias at your incision sites It is normal to feel tired. You may need more sleep than usual.  Get your rest but make sure to get up and move around frequently to prevent blood clots and pneumonia.  Work and Return to School You can go back to work when you feel well enough. Discuss the timing with your surgeon. You can usually go back to school or work 1 week after an operation. If your work requires heavy lifting or strenuous activity you need to be placed on light duty for 4 weeks following surgery. You can return to gym class, sports or other physical activities 4 weeks after surgery.  Wound Care Always wash your hands before and after touching near your incision site. Do not soak in a bathtub until cleared at your follow up appointment. You may take a shower 24 hours after surgery. A small amount of drainage from the incision is normal. If the drainage is thick and yellow or the site is red, you may have an infection, so call your surgeon. If you  have a drain in one of your incisions, it will be taken out in office when the drainage stops. Steri-Strips will fall off in 7 to 10 days or they will be removed during your first office visit. If you have dermabond glue covering over the incision, allow the glue to flake off on its own. Avoid wearing tight or rough clothing. It may rub your incisions and make it harder for them to heal. Protect the new skin, especially from the sun. The sun can burn and cause darker scarring. Your scar will heal in about 4 to 6 weeks and will become softer and continue to fade over the next year.  The cosmetic appearance of the incisions will improve over the course of the first year after surgery. Sensation around your incision will return in a few weeks or months.  Bowel Movements After intestinal surgery, you may have loose watery stools for several days. If watery diarrhea lasts longer than 3 days, contact your surgeon. Pain medication (narcotics) can cause constipation. Increase the fiber in your diet with high-fiber foods if you are constipated. You can take an over the counter stool softener like Colace to avoid constipation.  Additional over the counter medications can also be used if Colace isn't sufficient (for example, Milk of Magnesia or Miralax).  Pain The amount of pain is different for each person. Some people need only 1 to 3 doses of pain control medication, while others need more. Take alternating doses of tylenol   and ibuprofen around the clock for the first five days following surgery.  This will provide a baseline of pain control and help with inflammation.  Take the narcotic pain medication in addition if needed for severe pain.  Contact Your Surgeon at [phone], if you have: Pain in your right upper abdomen like a gallbladder attack. Pain that will not go away Pain that gets worse A fever of more than 101F (38.3C) Repeated vomiting Swelling, redness, bleeding, or bad-smelling  drainage from your wound site Strong abdominal pain No bowel movement or unable to pass gas for 3 days Watery diarrhea lasting longer than 3 days  Pain Control The goal of pain control is to minimize pain, keep you moving and help you heal. Your surgical team will work with you on your pain plan. Most often a combination of therapies and medications are used to control your pain. You may also be given medication (local anesthetic) at the surgical site. This may help control your pain for several days. Extreme pain puts extra stress on your body at a time when your body needs to focus on healing. Do not wait until your pain has reached a level "10" or is unbearable before telling your doctor or nurse. It is much easier to control pain before it becomes severe. Following a laparoscopic procedure, pain is sometimes felt in the shoulder. This is due to the gas inserted into your abdomen during the procedure. Moving and walking helps to decrease the gas and the right shoulder pain.  Use the guide below for ways to manage your post-operative pain. Learn more by going to facs.org/safepaincontrol.  How Intense Is My Pain Common Therapies to Feel Better       I hardly notice my pain, and it does not interfere with my activities.  I notice my pain and it distracts me, but I can still do activities (sitting up, walking, standing).  Non-Medication Therapies  Ice (in a bag, applied over clothing at the surgical site), elevation, rest, meditation, massage, distraction (music, TV, play) walking and mild exercise Splinting the abdomen with pillows +  Non-Opioid Medications Acetaminophen (Tylenol) Non-steroidal anti-inflammatory drugs (NSAIDS) Aspirin, Ibuprofen (Motrin, Advil) Naproxen (Aleve) Take these as needed, when you feel pain. Both acetaminophen and NSAIDs help to decrease pain and swelling (inflammation).      My pain is hard to ignore and is more noticeable even when I rest.  My  pain interferes with my usual activities.  Non-Medication Therapies  +  Non-Opioid medications  Take on a regular schedule (around-the-clock) instead of as needed. (For example, Tylenol every 6 hours at 9:00 am, 3:00 pm, 9:00 pm, 3:00 am and Motrin every 6 hours at 12:00 am, 6:00 am, 12:00 pm, 6:00 pm)         I am focused on my pain, and I am not doing my daily activities.  I am groaning in pain, and I cannot sleep. I am unable to do anything.  My pain is as bad as it could be, and nothing else matters.  Non-Medication Therapies  +  Around-the-Clock Non-Opioid Medications  +  Short-acting opioids  Opioids should be used with other medications to manage severe pain. Opioids block pain and give a feeling of euphoria (feel high). Addiction, a serious side effect of opioids, is rare with short-term (a few days) use.  Examples of short-acting opioids include: Tramadol (Ultram), Hydrocodone (Norco, Vicodin), Hydromorphone (Dilaudid), Oxycodone (Oxycontin)     The above directions have been adapted from   the [employer] Surgical Patient Education Program.  Please refer to the ACS website if needed: https://www.facs.org/-/media/files/education/patient-ed/cholesys.ashx.   Paul Stechschulte, MD [facility] [address], [address], [place], [place]  [address] ?  [address] [address], [place], [place]   [address] [phone] ? [phone] ? FAX [phone] Web site: [email or web address]  

## 2021-08-12 NOTE — Anesthesia Procedure Notes (Signed)
Procedure Name: Intubation ?Date/Time: 08/12/2021 7:43 AM ?Performed by: Lieutenant Diego, CRNA ?Pre-anesthesia Checklist: Patient identified, Emergency Drugs available, Suction available and Patient being monitored ?Patient Re-evaluated:Patient Re-evaluated prior to induction ?Oxygen Delivery Method: Circle system utilized ?Preoxygenation: Pre-oxygenation with 100% oxygen ?Induction Type: IV induction ?Ventilation: Mask ventilation without difficulty ?Laryngoscope Size: Sabra Heck and 2 ?Grade View: Grade I ?Tube type: Oral ?Tube size: 7.0 mm ?Number of attempts: 1 ?Airway Equipment and Method: Stylet and Oral airway ?Placement Confirmation: ETT inserted through vocal cords under direct vision, positive ETCO2 and breath sounds checked- equal and bilateral ?Secured at: 22 cm ?Tube secured with: Tape ?Dental Injury: Teeth and Oropharynx as per pre-operative assessment  ? ? ? ? ?

## 2021-08-12 NOTE — Op Note (Signed)
? ?Patient: [name] (1940/08/16, [id number]) ? ?Date of Surgery: 08/12/2021  ? ?Preoperative Diagnosis: CHOLELITHIASIS  ? ?Postoperative Diagnosis: CHOLELITHIASIS  ? ?Surgical Procedure: LAPAROSCOPIC CHOLECYSTECTOMY:   ? ?Operative Team Members:  ?Surgeon(s) and Role: ?   * Miroslava Santellan, Nickola Major, MD - Primary  ? ?Anesthesiologist: Barnet Glasgow, MD ?CRNA: Lind Covert, CRNA; Eulas Post, April W, CRNA  ? ?Anesthesia: General  ? ?Fluids:  ?Total I/O ?In: 1000 [I.V.:1000] ?Out: -  ? ?Complications: None ? ?Drains:  none  ? ?Specimen:  ?ID Type Source Tests Collected by Time Destination  ?1 : gallbladder Tissue PATH Soft tissue SURGICAL PATHOLOGY Zayne Draheim, Nickola Major, MD 08/12/2021 [phone]   ?  ? ?Disposition:  PACU - hemodynamically stable. ? ?Plan of Care: Discharge to home after PACU ? ? ? ?Indications for Procedure: [name] is a 81 y.o. female who presented with abdominal pain.  History, physical and imaging was concerning for cholecystitis.  Laparoscopic cholecystectomy was recommended for the patient.  The procedure itself, as well as the risks, benefits and alternatives were discussed with the patient.  Risks discussed included but were not limited to the risk of infection, bleeding, damage to nearby structures, need to convert to open procedure, incisional hernia, bile leak, common bile duct injury and the need for additional procedures or surgeries.  With this discussion complete and all questions answered the patient granted consent to proceed. ? ?Findings: Gallstones, inflamed gallbladder ? ?Infection status: ?Patient: Private Patient Elective Case ?Case: Elective ?Infection Present At Time Of Surgery (PATOS):  Inflamed gallbladder with some spillage of bile during the case ? ? ?Description of Procedure:  ? ?On the date stated above, the patient was taken to the operating room suite and placed in supine positioning.  Sequential compression devices were placed on the lower extremities to prevent  blood clots.  General endotracheal anesthesia was induced. Preoperative antibiotics (clindamycin) were given within 30 minutes of incision.  The patient's abdomen was prepped and draped in the usual sterile fashion.  A time-out was completed verifying the correct patient, procedure, positioning and equipment needed for the case. ? ?We began by anesthetizing the skin with local anesthetic and then making a 5 mm incision just below the umbilicus.  We dissected through the subcutaneous tissues to the fascia.  The fascia was grasped and elevated using a Kocher clamp.  A Veress needle was inserted into the abdomen and the abdomen was insufflated to 15 mmHg.  A 5 mm trocar was inserted in this position under optical guidance and then the abdomen was inspected.  There was no trauma to the underlying viscera with initial trocar placement.  Any abnormal findings, other than inflammation in the right upper quadrant, are listed above in the findings section.  Three additional trocars were placed, one 12 mm trocar in the subxiphoid position, one 5 mm trocar in the midline epigastric area and one 38m trocar in the right upper quadrant subcostally.  These were placed under direct vision without any trauma to the underlying viscera.   ? ?The patient was then placed in head up, left side down positioning.  The gallbladder was identified and dissected free from its attachments to the omentum allowing the duodenum to fall away.  The infundibulum of the gallbladder was dissected free working laterally to medially.  The cystic duct and cystic artery were dissected free from surrounding connective tissue.  The infundibulum of the gallbladder was dissected off the cystic plate.  A critical view of safety  was obtained with the cystic duct and cystic artery being cleared of connective tissues and clearly the only two structures entering into the gallbladder with the liver clearly visible behind.  Clips were then applied to the cystic duct  and cystic artery and then these structures were divided.  The gallbladder was dissected off the cystic plate, placed in an endocatch bag and removed from the 12 mm subxiphoid port site.  The clips were inspected and appeared effective.  The cystic plate was inspected and hemostasis was obtained using electrocautery.  A suction irrigator was used to clean the operative field.  Attention was turned to closure.  The 12 mm subxiphoid port site was closed using a 0-vicryl suture on a fascial suture passer.  The abdomen was desufflated.  The skin was closed using 4-0 monocryl and dermabond.  All sponge and needle counts were correct at the conclusion of the case. ? ? ? ?Louanna Raw, MD ?General, Bariatric, & Minimally Invasive Surgery ?[facility], [place] ? ?

## 2021-08-12 NOTE — Anesthesia Postprocedure Evaluation (Signed)
Anesthesia Post Note ? ?Patient: [name] ? ?Procedure(s) Performed: LAPAROSCOPIC CHOLECYSTECTOMY (Abdomen) ? ?  ? ?Patient location during evaluation: PACU ?Anesthesia Type: General ?Level of consciousness: awake and alert ?Pain management: pain level controlled ?Vital Signs Assessment: post-procedure vital signs reviewed and stable ?Respiratory status: spontaneous breathing, nonlabored ventilation, respiratory function stable and patient connected to nasal cannula oxygen ?Cardiovascular status: blood pressure returned to baseline and stable ?Postop Assessment: no apparent nausea or vomiting ?Anesthetic complications: no ?Comments: Baseline O2 Sat 92-93% has home O2 She rarely uss. Patient awake and alert using incentiuve spirometer.O2 sat 88-90% agrees to go home and use O2 for 24 hours ? ? ?No notable events documented. ? ?Last Vitals:  ?Vitals:  ? 08/12/21 1015 08/12/21 1030  ?BP: 104/88 103/78  ?Pulse: 85 83  ?Resp: 14 15  ?Temp:  36.8 ?C  ?SpO2: 90% 92%  ?  ?Last Pain:  ?Vitals:  ? 08/12/21 1030  ?TempSrc:   ?PainSc: 0-No pain  ? ? ?  ?  ?  ?  ?  ?  ? ?Barnet Glasgow ? ? ? ? ?

## 2021-08-12 NOTE — H&P (Signed)
? ?Admitting Physician: Nickola Major Daivon Rayos ? ?Service: General surgery ? ?CC: Abdominal pain ? ?Subjective  ? ?HPI: ?[name] is an 81 y.o. female who is here for elective laparoscopic cholecystectomy. ? ?Past Medical History:  ?Diagnosis Date  ? Allergic rhinitis   ? Anemia due to GI blood loss   ? Anxiety   ? Asthma   ? Chronic airway obstruction, not elsewhere classified   ? Depressive disorder, not elsewhere classified   ? DVT (deep venous thrombosis) ([place])   ? LLE DVT 01/13/19  ? Dyspnea   ? Esophageal reflux   ? Essential hypertension 08/27/2019  ? PE (pulmonary thromboembolism) ([place]) 01/29/2019  ? Pneumonia   ? Wears partial dentures   ? ? ?Past Surgical History:  ?Procedure Laterality Date  ? ABDOMINAL HYSTERECTOMY    ? ABDOMINAL HYSTERECTOMY    ? CATARACT EXTRACTION W/ INTRAOCULAR LENS  IMPLANT, BILATERAL    ? COLONOSCOPY    ? COLONOSCOPY WITH PROPOFOL N/A 08/20/2019  ? Procedure: COLONOSCOPY WITH PROPOFOL;  Surgeon: Ronald Lobo, MD;  Location: [place];  Service: Endoscopy;  Laterality: N/A;  ? CYSTOSCOPY    ? DILATION AND CURETTAGE OF UTERUS    ? ECTROPION REPAIR Bilateral 05/12/2021  ? Procedure: REPAIR OF ECTROPION BY LATERAL TARSAL STRIP OF BILATERAL LOWER EYE LIDS;  Surgeon: Delia Chimes, MD;  Location: [place];  Service: Ophthalmology;  Laterality: Bilateral;  ? ESOPHAGOGASTRODUODENOSCOPY (EGD) WITH PROPOFOL N/A 08/20/2019  ? Procedure: ESOPHAGOGASTRODUODENOSCOPY (EGD) WITH PROPOFOL;  Surgeon: Ronald Lobo, MD;  Location: [place];  Service: Endoscopy;  Laterality: N/A;  ? HEMOSTASIS CONTROL  08/20/2019  ? Procedure: HEMOSTASIS CONTROL;  Surgeon: Ronald Lobo, MD;  Location: [place];  Service: Endoscopy;;  ? INTRAVASCULAR ULTRASOUND/IVUS Left 05/26/2019  ? Procedure: Intravascular Ultrasound/IVUS;  Surgeon: Waynetta Sandy, MD;  Location: [facility];  Service: Cardiovascular;  Laterality: Left;  ? IVC FILTER REMOVAL N/A 03/24/2019  ? Procedure: IVC FILTER  REMOVAL;  Surgeon: Waynetta Sandy, MD;  Location: [place] [facility];  Service: Cardiovascular;  Laterality: N/A;  ? LACRIMAL TUBE INSERTION Right 05/12/2021  ? Procedure: RIGHT NASOLACRIMAL DUCT PROBE WITH MINI MONOKA STENT PLACEMENT;  Surgeon: Delia Chimes, MD;  Location: [place];  Service: Ophthalmology;  Laterality: Right;  ? LESION EXCISION WITH COMPLEX REPAIR N/A 05/12/2021  ? Procedure: NASAL LESION EXCISION;  Surgeon: Delia Chimes, MD;  Location: [place];  Service: Ophthalmology;  Laterality: N/A;  ? LOWER EXTREMITY VENOGRAPHY Left 05/26/2019  ? Procedure: LOWER EXTREMITY VENOGRAPHY;  Surgeon: Waynetta Sandy, MD;  Location: [place] [facility];  Service: Cardiovascular;  Laterality: Left;  ? MULTIPLE TOOTH EXTRACTIONS    ? NASAL SINUS SURGERY    ? PERIPHERAL VASCULAR INTERVENTION Left 05/26/2019  ? Procedure: PERIPHERAL VASCULAR INTERVENTION;  Surgeon: Waynetta Sandy, MD;  Location: [place] [facility];  Service: Cardiovascular;  Laterality: Left;  lower extremity veins  ? PERIPHERAL VASCULAR THROMBECTOMY  05/26/2019  ? Procedure: PERIPHERAL VASCULAR THROMBECTOMY;  Surgeon: Waynetta Sandy, MD;  Location: [place];  Service: Cardiovascular;;  ? PTOSIS REPAIR Bilateral 05/12/2021  ? Procedure: EXTERNAL PTOSIS REPAIR LEVATOR ADVANCEDMENT/RESECTION OF BILATERAL UPPER EYE LIDS;  Surgeon: Delia Chimes, MD;  Location: [facility];  Service: Ophthalmology;  Laterality: Bilateral;  ? TUBAL LIGATION    ? VENA CAVA FILTER PLACEMENT N/A 01/13/2019  ? Procedure: INSERTION VENA-CAVA FILTER;  Surgeon: Waynetta Sandy, MD;  Location: [place];  Service: Vascular;  Laterality: N/A;  ? ? ?  Family History  ?Problem Relation Age of Onset  ? Diabetes Mother   ? Coronary artery disease Mother   ? Emphysema Father   ? Coronary artery disease Brother   ? ? ?Social:  reports that she quit smoking about 8 years ago. Her smoking use included cigarettes. She has a 10.00  pack-year smoking history. She has never used smokeless tobacco. She reports that she does not drink alcohol and does not use drugs. ? ?Allergies:  ?Allergies  ?Allergen Reactions  ? Pneumococcal Vaccines Shortness Of Breath and Other (See Comments)  ?  Couldn't breathe ?  ? Clarithromycin Swelling  ? Estradiol Rash  ?  Reaction to cream  ? Sulfa Antibiotics Nausea And Vomiting and Swelling  ? Penicillins Swelling  ?  Tongue swelling ?Did it involve swelling of the face/tongue/throat, SOB, or low BP? Yes ?Did it involve sudden or severe rash/hives, skin peeling, or any reaction on the inside of your mouth or nose? Yes ?Did you need to seek medical attention at a hospital or doctor's office? Yes ?When did it last happen?  childhood ?If all above answers are "NO", may proceed with cephalosporin use. ?  ? Sulfonamide Derivatives Swelling  ? ? ?Medications: ?Current Outpatient Medications  ?Medication Instructions  ? acetaminophen (TYLENOL) 500 mg, Oral, Every 6 hours PRN  ? albuterol (PROVENTIL) 2.5 mg, Nebulization, Every 6 hours PRN  ? albuterol (VENTOLIN HFA) 108 (90 Base) MCG/ACT inhaler 1-2 puffs, Inhalation, Every 6 hours PRN  ? aspirin EC 81 mg, Oral, Daily, Swallow whole.  ? cetirizine (ZYRTEC) 10 mg, Oral, Daily PRN  ? escitalopram (LEXAPRO) 20 mg, Oral, Daily  ? fluticasone-salmeterol (ADVAIR) 250-50 MCG/ACT AEPB Inhale 1 puff then rinse mouth, twice daily  ? LORazepam (ATIVAN) 1 mg, Oral, Daily at bedtime  ? omeprazole (PRILOSEC) 20 mg, Oral, Daily  ? traZODone (DESYREL) 100 mg, Oral, Daily at bedtime  ? triamterene-hydrochlorothiazide (MAXZIDE-25) 37.5-25 MG tablet 0.5 tablets, Oral, Daily  ? ? ?ROS - all of the below systems have been reviewed with the patient and positives are indicated with bold text ?General: chills, fever or night sweats ?Eyes: blurry vision or double vision ?ENT: epistaxis or sore throat ?Allergy/Immunology: itchy/watery eyes or nasal congestion ?Hematologic/Lymphatic: bleeding  problems, blood clots or swollen lymph nodes ?Endocrine: temperature intolerance or unexpected weight changes ?Breast: new or changing breast lumps or nipple discharge ?Resp: cough, shortness of breath, or wheezing ?CV: chest pain or dyspnea on exertion ?GI: as per HPI ?GU: dysuria, trouble voiding, or hematuria ?MSK: joint pain or joint stiffness ?Neuro: TIA or stroke symptoms ?Derm: pruritus and skin lesion changes ?Psych: anxiety and depression ? ?Objective  ? ?PE ?Blood pressure (!) 131/94, pulse 76, temperature 98 ?F (36.7 ?C), temperature source Oral, resp. rate 18, height '5\' 6"'$  (1.676 m), weight 75.3 kg, SpO2 93 %. ?Constitutional: NAD; conversant; no deformities ?Eyes: Moist conjunctiva; no lid lag; anicteric; PERRL ?Neck: Trachea midline; no thyromegaly ?Lungs: Normal respiratory effort; no tactile fremitus ?CV: RRR; no palpable thrills; no pitting edema ?GI: Abd Soft, nontender; no palpable hepatosplenomegaly ?MSK: Normal range of motion of extremities; no clubbing/cyanosis ?Psychiatric: Appropriate affect; alert and oriented x3 ?Lymphatic: No palpable cervical or axillary lymphadenopathy ? ?No results found for this or any previous visit (from the past 24 hour(s)). ? ?Imaging Orders  ?No imaging studies ordered today  ? ? ?Right upper quadrant ultrasound: ?1. Cholelithiasis with slight wall thickening but negative ?sonographic Murphy's. Correlation with nuclear medicine ?hepatobiliary imaging could be considered. ?2. 5.7  cm left renal cyst. ?cbd 5.27m  ? ? ?Assessment and Plan  ? ? ?Calculus of gallbladder with chronic cholecystitis without obstruction ? ? ? ?I recommended laparoscopic cholecystectomy. The procedure itself as well as its risk, benefits, and alternatives were discussed with the patient. After full discussion all questions answered the patient granted consent to proceed. We will proceed as scheduled ? ? ? ? ? ?PFelicie Morn MD ? ?[facility] ?Use [email or web address] to  contact on call provider ? ? ? ?

## 2021-08-12 NOTE — Progress Notes (Signed)
[place] MD was notified of patient's oxygen saturations of 88 on room air, patient will go up to 91 on room air when coughing, deep breathing, and using an incentive spirometer. Patient has an oxygen tank at home. MD cleared her to go home as long as she wears her oxygen for 24 hours, uses the incentive spirometer, and coughs and deep breathes. Family has been educated and patient has been educated. An incentive spirometer has been given to her and education was provided.  ?

## 2021-08-13 ENCOUNTER — Encounter (HOSPITAL_COMMUNITY): Payer: Self-pay | Admitting: Surgery

## 2021-08-15 LAB — SURGICAL PATHOLOGY

## 2021-08-25 DIAGNOSIS — J449 Chronic obstructive pulmonary disease, unspecified: Secondary | ICD-10-CM | POA: Diagnosis not present

## 2021-09-07 DIAGNOSIS — H02402 Unspecified ptosis of left eyelid: Secondary | ICD-10-CM | POA: Diagnosis not present

## 2021-09-16 DIAGNOSIS — L820 Inflamed seborrheic keratosis: Secondary | ICD-10-CM | POA: Diagnosis not present

## 2021-09-16 DIAGNOSIS — X32XXXA Exposure to sunlight, initial encounter: Secondary | ICD-10-CM | POA: Diagnosis not present

## 2021-09-16 DIAGNOSIS — Z1283 Encounter for screening for malignant neoplasm of skin: Secondary | ICD-10-CM | POA: Diagnosis not present

## 2021-09-16 DIAGNOSIS — D225 Melanocytic nevi of trunk: Secondary | ICD-10-CM | POA: Diagnosis not present

## 2021-09-16 DIAGNOSIS — T148XXA Other injury of unspecified body region, initial encounter: Secondary | ICD-10-CM | POA: Diagnosis not present

## 2021-09-16 DIAGNOSIS — L570 Actinic keratosis: Secondary | ICD-10-CM | POA: Diagnosis not present

## 2021-09-25 DIAGNOSIS — J449 Chronic obstructive pulmonary disease, unspecified: Secondary | ICD-10-CM | POA: Diagnosis not present

## 2021-10-06 ENCOUNTER — Ambulatory Visit: Payer: PPO | Admitting: Internal Medicine

## 2021-10-25 DIAGNOSIS — E7801 Familial hypercholesterolemia: Secondary | ICD-10-CM | POA: Diagnosis not present

## 2021-10-25 DIAGNOSIS — E559 Vitamin D deficiency, unspecified: Secondary | ICD-10-CM | POA: Diagnosis not present

## 2021-10-25 DIAGNOSIS — N390 Urinary tract infection, site not specified: Secondary | ICD-10-CM | POA: Diagnosis not present

## 2021-10-25 DIAGNOSIS — J449 Chronic obstructive pulmonary disease, unspecified: Secondary | ICD-10-CM | POA: Diagnosis not present

## 2021-10-26 DIAGNOSIS — E7800 Pure hypercholesterolemia, unspecified: Secondary | ICD-10-CM | POA: Diagnosis not present

## 2021-10-26 DIAGNOSIS — J441 Chronic obstructive pulmonary disease with (acute) exacerbation: Secondary | ICD-10-CM | POA: Diagnosis not present

## 2021-10-26 DIAGNOSIS — I10 Essential (primary) hypertension: Secondary | ICD-10-CM | POA: Diagnosis not present

## 2021-10-26 DIAGNOSIS — J449 Chronic obstructive pulmonary disease, unspecified: Secondary | ICD-10-CM | POA: Diagnosis not present

## 2021-11-01 DIAGNOSIS — N390 Urinary tract infection, site not specified: Secondary | ICD-10-CM | POA: Diagnosis not present

## 2021-11-01 DIAGNOSIS — I10 Essential (primary) hypertension: Secondary | ICD-10-CM | POA: Diagnosis not present

## 2021-11-01 DIAGNOSIS — N3001 Acute cystitis with hematuria: Secondary | ICD-10-CM | POA: Diagnosis not present

## 2021-11-01 DIAGNOSIS — J449 Chronic obstructive pulmonary disease, unspecified: Secondary | ICD-10-CM | POA: Diagnosis not present

## 2021-11-01 DIAGNOSIS — E559 Vitamin D deficiency, unspecified: Secondary | ICD-10-CM | POA: Diagnosis not present

## 2021-11-14 DIAGNOSIS — R0602 Shortness of breath: Secondary | ICD-10-CM | POA: Diagnosis not present

## 2021-11-14 DIAGNOSIS — R062 Wheezing: Secondary | ICD-10-CM | POA: Diagnosis not present

## 2021-11-14 DIAGNOSIS — Z8701 Personal history of pneumonia (recurrent): Secondary | ICD-10-CM | POA: Diagnosis not present

## 2021-11-14 DIAGNOSIS — J441 Chronic obstructive pulmonary disease with (acute) exacerbation: Secondary | ICD-10-CM | POA: Diagnosis not present

## 2021-11-15 DIAGNOSIS — R0602 Shortness of breath: Secondary | ICD-10-CM | POA: Diagnosis not present

## 2021-11-16 ENCOUNTER — Observation Stay (HOSPITAL_COMMUNITY)
Admission: EM | Admit: 2021-11-16 | Discharge: 2021-11-17 | Disposition: A | Discharge status: Home / Self Care | Payer: PPO | Attending: Internal Medicine | Admitting: Internal Medicine

## 2021-11-16 ENCOUNTER — Encounter (HOSPITAL_COMMUNITY): Payer: Self-pay

## 2021-11-16 ENCOUNTER — Other Ambulatory Visit: Payer: Self-pay

## 2021-11-16 ENCOUNTER — Emergency Department (HOSPITAL_COMMUNITY): Payer: PPO

## 2021-11-16 DIAGNOSIS — Z20822 Contact with and (suspected) exposure to covid-19: Secondary | ICD-10-CM | POA: Diagnosis not present

## 2021-11-16 DIAGNOSIS — J45909 Unspecified asthma, uncomplicated: Secondary | ICD-10-CM | POA: Diagnosis not present

## 2021-11-16 DIAGNOSIS — J441 Chronic obstructive pulmonary disease with (acute) exacerbation: Principal | ICD-10-CM | POA: Insufficient documentation

## 2021-11-16 DIAGNOSIS — J9601 Acute respiratory failure with hypoxia: Secondary | ICD-10-CM

## 2021-11-16 DIAGNOSIS — Z7982 Long term (current) use of aspirin: Secondary | ICD-10-CM | POA: Insufficient documentation

## 2021-11-16 DIAGNOSIS — Z86711 Personal history of pulmonary embolism: Secondary | ICD-10-CM | POA: Insufficient documentation

## 2021-11-16 DIAGNOSIS — I10 Essential (primary) hypertension: Secondary | ICD-10-CM | POA: Diagnosis not present

## 2021-11-16 DIAGNOSIS — Z86718 Personal history of other venous thrombosis and embolism: Secondary | ICD-10-CM | POA: Diagnosis not present

## 2021-11-16 DIAGNOSIS — Z79899 Other long term (current) drug therapy: Secondary | ICD-10-CM | POA: Insufficient documentation

## 2021-11-16 DIAGNOSIS — R0602 Shortness of breath: Secondary | ICD-10-CM | POA: Diagnosis not present

## 2021-11-16 DIAGNOSIS — Z87891 Personal history of nicotine dependence: Secondary | ICD-10-CM | POA: Insufficient documentation

## 2021-11-16 DIAGNOSIS — F32A Depression, unspecified: Secondary | ICD-10-CM | POA: Diagnosis present

## 2021-11-16 DIAGNOSIS — Z961 Presence of intraocular lens: Secondary | ICD-10-CM | POA: Diagnosis not present

## 2021-11-16 DIAGNOSIS — F419 Anxiety disorder, unspecified: Secondary | ICD-10-CM | POA: Diagnosis present

## 2021-11-16 DIAGNOSIS — J9621 Acute and chronic respiratory failure with hypoxia: Secondary | ICD-10-CM | POA: Insufficient documentation

## 2021-11-16 DIAGNOSIS — R9431 Abnormal electrocardiogram [ECG] [EKG]: Secondary | ICD-10-CM | POA: Diagnosis not present

## 2021-11-16 LAB — CBC WITH DIFFERENTIAL/PLATELET
Abs Immature Granulocytes: 0.05 10*3/uL (ref 0.00–0.07)
Basophils Absolute: 0 10*3/uL (ref 0.0–0.1)
Basophils Relative: 0 %
Eosinophils Absolute: 0 10*3/uL (ref 0.0–0.5)
Eosinophils Relative: 0 %
HCT: 49.4 % — ABNORMAL HIGH (ref 36.0–46.0)
Hemoglobin: 15.6 g/dL — ABNORMAL HIGH (ref 12.0–15.0)
Immature Granulocytes: 1 %
Lymphocytes Relative: 4 %
Lymphs Abs: 0.4 10*3/uL — ABNORMAL LOW (ref 0.7–4.0)
MCH: 30.1 pg (ref 26.0–34.0)
MCHC: 31.6 g/dL (ref 30.0–36.0)
MCV: 95.2 fL (ref 80.0–100.0)
Monocytes Absolute: 0.4 10*3/uL (ref 0.1–1.0)
Monocytes Relative: 4 %
Neutro Abs: 8.9 10*3/uL — ABNORMAL HIGH (ref 1.7–7.7)
Neutrophils Relative %: 91 %
Platelets: 353 10*3/uL (ref 150–400)
RBC: 5.19 MIL/uL — ABNORMAL HIGH (ref 3.87–5.11)
RDW: 13.3 % (ref 11.5–15.5)
WBC: 9.8 10*3/uL (ref 4.0–10.5)
nRBC: 0 % (ref 0.0–0.2)

## 2021-11-16 LAB — TROPONIN I (HIGH SENSITIVITY)
Troponin I (High Sensitivity): 5 ng/L (ref <18)
Troponin I (High Sensitivity): 6 ng/L (ref <18)

## 2021-11-16 LAB — BASIC METABOLIC PANEL
Anion gap: 11 (ref 5–15)
BUN: 21 mg/dL (ref 8–23)
CO2: 26 mmol/L (ref 22–32)
Calcium: 9 mg/dL (ref 8.9–10.3)
Chloride: 101 mmol/L (ref 98–111)
Creatinine, Ser: 1.07 mg/dL — ABNORMAL HIGH (ref 0.44–1.00)
GFR, Estimated: 53 mL/min — ABNORMAL LOW (ref >60)
Glucose, Bld: 117 mg/dL — ABNORMAL HIGH (ref 70–99)
Potassium: 4.8 mmol/L (ref 3.5–5.1)
Sodium: 138 mmol/L (ref 135–145)

## 2021-11-16 LAB — RESP PANEL BY RT-PCR (FLU A&B, COVID) ARPGX2
Influenza A by PCR: NEGATIVE
Influenza B by PCR: NEGATIVE
SARS Coronavirus 2 by RT PCR: NEGATIVE

## 2021-11-16 LAB — BRAIN NATRIURETIC PEPTIDE: B Natriuretic Peptide: 22.6 pg/mL (ref 0.0–100.0)

## 2021-11-16 MED ORDER — ALBUTEROL SULFATE HFA 108 (90 BASE) MCG/ACT IN AERS: 2.0000 | INHALATION_SPRAY | RESPIRATORY_TRACT | Status: DC | PRN

## 2021-11-16 MED ORDER — MORPHINE SULFATE (PF) 2 MG/ML IV SOLN: 2.0000 mg | INTRAVENOUS | Status: DC | PRN

## 2021-11-16 MED ORDER — IPRATROPIUM-ALBUTEROL 0.5-2.5 (3) MG/3ML IN SOLN
3.0000 mL | Freq: Four times a day (QID) | RESPIRATORY_TRACT | Status: DC
Start: 2021-11-16 — End: 2021-11-17
  Administered 2021-11-16 – 2021-11-17 (×3): 3 mL via RESPIRATORY_TRACT
  Filled 2021-11-16 (×3): qty 3

## 2021-11-16 MED ORDER — OXYCODONE-ACETAMINOPHEN 5-325 MG PO TABS: 1.0000 | ORAL_TABLET | ORAL | Status: DC | PRN

## 2021-11-16 MED ORDER — ESCITALOPRAM OXALATE 10 MG PO TABS
20.0000 mg | ORAL_TABLET | Freq: Every day | ORAL | Status: DC
  Administered 2021-11-17: 20 mg via ORAL
  Filled 2021-11-16: qty 2

## 2021-11-16 MED ORDER — SODIUM CHLORIDE 0.9% FLUSH
3.0000 mL | Freq: Two times a day (BID) | INTRAVENOUS | Status: DC
  Administered 2021-11-17: 3 mL via INTRAVENOUS

## 2021-11-16 MED ORDER — TRAZODONE HCL 50 MG PO TABS
100.0000 mg | ORAL_TABLET | Freq: Every day | ORAL | Status: DC
  Administered 2021-11-16: 100 mg via ORAL
  Filled 2021-11-16: qty 2

## 2021-11-16 MED ORDER — ACETAMINOPHEN 325 MG PO TABS
650.0000 mg | ORAL_TABLET | Freq: Four times a day (QID) | ORAL | Status: DC | PRN
  Administered 2021-11-16: 650 mg via ORAL
  Filled 2021-11-16: qty 2

## 2021-11-16 MED ORDER — METHYLPREDNISOLONE SODIUM SUCC 125 MG IJ SOLR
60.0000 mg | Freq: Two times a day (BID) | INTRAMUSCULAR | Status: DC
  Administered 2021-11-16: 60 mg via INTRAVENOUS
  Filled 2021-11-16: qty 2

## 2021-11-16 MED ORDER — METHYLPREDNISOLONE SODIUM SUCC 125 MG IJ SOLR
125.0000 mg | Freq: Once | INTRAMUSCULAR | Status: AC
Start: 2021-11-16 — End: 2021-11-16
  Administered 2021-11-16: 125 mg via INTRAVENOUS
  Filled 2021-11-16: qty 2

## 2021-11-16 MED ORDER — PREDNISONE 20 MG PO TABS: 40.0000 mg | ORAL_TABLET | Freq: Every day | ORAL | Status: DC

## 2021-11-16 MED ORDER — ZOLPIDEM TARTRATE 5 MG PO TABS: 5.0000 mg | ORAL_TABLET | Freq: Every evening | ORAL | Status: DC | PRN

## 2021-11-16 MED ORDER — HYDRALAZINE HCL 20 MG/ML IJ SOLN
5.0000 mg | INTRAMUSCULAR | Status: DC | PRN
  Administered 2021-11-17: 5 mg via INTRAVENOUS
  Filled 2021-11-16: qty 1

## 2021-11-16 MED ORDER — ENOXAPARIN SODIUM 40 MG/0.4ML IJ SOSY
40.0000 mg | PREFILLED_SYRINGE | INTRAMUSCULAR | Status: DC
  Administered 2021-11-16: 40 mg via SUBCUTANEOUS
  Filled 2021-11-16: qty 0.4

## 2021-11-16 MED ORDER — BISACODYL 5 MG PO TBEC: 5.0000 mg | DELAYED_RELEASE_TABLET | Freq: Every day | ORAL | Status: DC | PRN

## 2021-11-16 MED ORDER — ONDANSETRON HCL 4 MG/2ML IJ SOLN: 4.0000 mg | Freq: Four times a day (QID) | INTRAMUSCULAR | Status: DC | PRN

## 2021-11-16 MED ORDER — GUAIFENESIN ER 600 MG PO TB12: 600.0000 mg | ORAL_TABLET | Freq: Two times a day (BID) | ORAL | Status: DC | PRN

## 2021-11-16 MED ORDER — IPRATROPIUM-ALBUTEROL 0.5-2.5 (3) MG/3ML IN SOLN
3.0000 mL | Freq: Once | RESPIRATORY_TRACT | Status: AC
  Administered 2021-11-16: 3 mL via RESPIRATORY_TRACT
  Filled 2021-11-16: qty 3

## 2021-11-16 MED ORDER — ALBUTEROL SULFATE (2.5 MG/3ML) 0.083% IN NEBU
10.0000 mg/h | INHALATION_SOLUTION | RESPIRATORY_TRACT | Status: DC
  Administered 2021-11-16: 10 mg/h via RESPIRATORY_TRACT
  Filled 2021-11-16: qty 12

## 2021-11-16 MED ORDER — ALBUTEROL SULFATE (2.5 MG/3ML) 0.083% IN NEBU: 2.5000 mg | INHALATION_SOLUTION | RESPIRATORY_TRACT | Status: DC | PRN

## 2021-11-16 MED ORDER — ONDANSETRON HCL 4 MG PO TABS: 4.0000 mg | ORAL_TABLET | Freq: Four times a day (QID) | ORAL | Status: DC | PRN

## 2021-11-16 MED ORDER — MOMETASONE FURO-FORMOTEROL FUM 200-5 MCG/ACT IN AERO
2.0000 | INHALATION_SPRAY | Freq: Two times a day (BID) | RESPIRATORY_TRACT | Status: DC
  Filled 2021-11-16: qty 8.8

## 2021-11-16 MED ORDER — POLYETHYLENE GLYCOL 3350 17 G PO PACK: 17.0000 g | PACK | Freq: Every day | ORAL | Status: DC | PRN

## 2021-11-16 MED ORDER — PANTOPRAZOLE SODIUM 40 MG PO TBEC
40.0000 mg | DELAYED_RELEASE_TABLET | Freq: Every day | ORAL | Status: DC
  Administered 2021-11-17: 40 mg via ORAL
  Filled 2021-11-16: qty 1

## 2021-11-16 MED ORDER — ASPIRIN 81 MG PO TBEC
81.0000 mg | DELAYED_RELEASE_TABLET | Freq: Every day | ORAL | Status: DC
  Administered 2021-11-17: 81 mg via ORAL
  Filled 2021-11-16: qty 1

## 2021-11-16 MED ORDER — LORAZEPAM 1 MG PO TABS
1.0000 mg | ORAL_TABLET | Freq: Every day | ORAL | Status: DC
  Administered 2021-11-16: 1 mg via ORAL
  Filled 2021-11-16: qty 1

## 2021-11-16 MED ORDER — TRIAMTERENE-HCTZ 37.5-25 MG PO TABS
0.5000 | ORAL_TABLET | Freq: Every day | ORAL | Status: DC
  Administered 2021-11-16 – 2021-11-17 (×2): 0.5 via ORAL
  Filled 2021-11-16 (×2): qty 1

## 2021-11-16 MED ORDER — SODIUM CHLORIDE 0.9 % IV SOLN
2.0000 g | Freq: Two times a day (BID) | INTRAVENOUS | Status: DC
  Administered 2021-11-16 – 2021-11-17 (×2): 2 g via INTRAVENOUS
  Filled 2021-11-16 (×2): qty 12.5

## 2021-11-16 MED ORDER — ACETAMINOPHEN 650 MG RE SUPP: 650.0000 mg | Freq: Four times a day (QID) | RECTAL | Status: DC | PRN

## 2021-11-16 MED ORDER — DOCUSATE SODIUM 100 MG PO CAPS
100.0000 mg | ORAL_CAPSULE | Freq: Two times a day (BID) | ORAL | Status: DC
  Administered 2021-11-16 – 2021-11-17 (×2): 100 mg via ORAL
  Filled 2021-11-16 (×2): qty 1

## 2021-11-16 NOTE — ED Provider Notes (Signed)
[facility] EMERGENCY DEPARTMENT Provider Note   CSN: [id number] Arrival date & time: 11/16/21  1052     History PMH: HTN, HLD, COPD, DVT not on anticoagulation Chief Complaint  Patient presents with   Shortness of Breath    [name] is a 81 y.o. female. Patient presents with worsening shortness of breath over the past 3 days.  She says it is worse with exertion.  She does not have it when she is laying flat.  She says she does have home O2 that she can use as needed but she never goes above 1-1/2 L and she rarely ever needs it.  She says that she been having to go up to 3 L on her oxygen because her home O2 sat was 75% for showering this morning.  She has associated productive cough with white sputum as well as chest tightness. She was seen by her primary doctor yesterday who did a CTA chest which revealed no pulmonary embolism.  She was also given a 3-day supply of 20 mg prednisone which has not helped.  Breathing treatments at home have not helped. She denies any fevers or chills.   Shortness of Breath      Home Medications Prior to Admission medications   Medication Sig Start Date End Date Taking? Authorizing Provider  acetaminophen (TYLENOL) 500 MG tablet Take 500 mg by mouth every 6 (six) hours as needed for headache (pain).    [provider]  albuterol (PROVENTIL) (2.5 MG/3ML) 0.083% nebulizer solution Take 3 mLs (2.5 mg total) by nebulization every 6 (six) hours as needed for wheezing or shortness of breath. 06/30/19   Deneise Lever, MD  albuterol (VENTOLIN HFA) 108 (90 Base) MCG/ACT inhaler Inhale 1-2 puffs into the lungs every 6 (six) hours as needed for wheezing or shortness of breath.    [provider]  aspirin EC 81 MG tablet Take 81 mg by mouth daily. Swallow whole.    [provider]  cetirizine (ZYRTEC) 10 MG tablet Take 10 mg by mouth daily as needed for allergies.    [provider]  escitalopram (LEXAPRO)  20 MG tablet Take 20 mg by mouth daily.    [provider]  fluticasone-salmeterol (ADVAIR) 250-50 MCG/ACT AEPB Inhale 1 puff then rinse mouth, twice daily Patient taking differently: Inhale 1 puff into the lungs in the morning and at bedtime. Inhale 1 puff then rinse mouth, twice daily 09/29/20   Baird Lyons D, MD  LORazepam (ATIVAN) 1 MG tablet Take 1 mg by mouth at bedtime.    [provider]  omeprazole (PRILOSEC) 20 MG capsule Take 20 mg by mouth daily. 03/26/20   [provider]  oxyCODONE-acetaminophen (PERCOCET) 5-325 MG tablet Take 1 tablet by mouth every 4 (four) hours as needed for severe pain. 08/12/21 08/12/22  Stechschulte, Nickola Major, MD  traZODone (DESYREL) 100 MG tablet Take 100 mg by mouth at bedtime.    [provider]  triamterene-hydrochlorothiazide (MAXZIDE-25) 37.5-25 MG tablet Take 0.5 tablets by mouth daily. 02/06/19   [provider]      Allergies    Pneumococcal vaccines, Clarithromycin, Estradiol, Sulfa antibiotics, Penicillins, and Sulfonamide derivatives    Review of Systems   Review of Systems  Respiratory:  Positive for shortness of breath.   All other systems reviewed and are negative.   Physical Exam Updated Vital Signs BP (!) 141/94   Pulse 91   Temp 98.1 F (36.7 C) (Oral)   Resp  16   Ht '5\' 6"'$  (1.676 m)   Wt 74.8 kg   SpO2 94%   BMI 26.63 kg/m  Physical Exam Vitals and nursing note reviewed.  Constitutional:      General: She is not in acute distress.    Appearance: Normal appearance. She is well-developed. She is not ill-appearing, toxic-appearing or diaphoretic.  HENT:     Head: Normocephalic and atraumatic.     Nose: No nasal deformity.     Mouth/Throat:     Lips: Pink. No lesions.     Mouth: Mucous membranes are moist. No injury, lacerations, oral lesions or angioedema.     Pharynx: Oropharynx is clear. Uvula midline. No pharyngeal swelling, oropharyngeal exudate, posterior oropharyngeal erythema  or uvula swelling.  Eyes:     General: Gaze aligned appropriately. No scleral icterus.       Right eye: No discharge.        Left eye: No discharge.     Conjunctiva/sclera: Conjunctivae normal.     Right eye: Right conjunctiva is not injected. No exudate or hemorrhage.    Left eye: Left conjunctiva is not injected. No exudate or hemorrhage. Neck:     Vascular: No JVD.     Trachea: No tracheal deviation.  Cardiovascular:     Rate and Rhythm: Normal rate and regular rhythm. No extrasystoles are present.    Pulses: Normal pulses. No decreased pulses.          Radial pulses are 2+ on the right side and 2+ on the left side.       Dorsalis pedis pulses are 2+ on the right side and 2+ on the left side.     Heart sounds: Normal heart sounds, S1 normal and S2 normal. Heart sounds not distant. No murmur heard.    No friction rub. No gallop. No S3 or S4 sounds.  Pulmonary:     Effort: Pulmonary effort is normal. No tachypnea, accessory muscle usage or respiratory distress.     Breath sounds: No stridor. Examination of the right-upper field reveals decreased breath sounds and wheezing. Examination of the left-upper field reveals decreased breath sounds and wheezing. Examination of the right-middle field reveals decreased breath sounds and wheezing. Examination of the left-middle field reveals decreased breath sounds and wheezing. Examination of the right-lower field reveals decreased breath sounds and wheezing. Examination of the left-lower field reveals decreased breath sounds and wheezing. Decreased breath sounds and wheezing present. No rhonchi or rales.  Chest:     Chest wall: No tenderness.  Abdominal:     General: Abdomen is flat. Bowel sounds are normal. There is no distension.     Palpations: Abdomen is soft. There is no mass or pulsatile mass.     Tenderness: There is no abdominal tenderness. There is no guarding or rebound.  Musculoskeletal:     Right lower leg: No tenderness. No edema.      Left lower leg: No tenderness. No edema.  Skin:    General: Skin is warm and dry.     Coloration: Skin is not jaundiced or pale.     Findings: No bruising, erythema, lesion or rash.  Neurological:     General: No focal deficit present.     Mental Status: She is alert and oriented to person, place, and time.     GCS: GCS eye subscore is 4. GCS verbal subscore is 5. GCS motor subscore is 6.  Psychiatric:        Mood and Affect: Mood  normal.        Speech: Speech normal.        Behavior: Behavior normal. Behavior is cooperative.     ED Results / Procedures / Treatments   Labs (all labs ordered are listed, but only abnormal results are displayed) Labs Reviewed  BASIC METABOLIC PANEL - Abnormal; Notable for the following components:      Result Value   Glucose, Bld 117 (*)    Creatinine, Ser 1.07 (*)    GFR, Estimated 53 (*)    All other components within normal limits  CBC WITH DIFFERENTIAL/PLATELET - Abnormal; Notable for the following components:   RBC 5.19 (*)    Hemoglobin 15.6 (*)    HCT 49.4 (*)    Neutro Abs 8.9 (*)    Lymphs Abs 0.4 (*)    All other components within normal limits  RESP PANEL BY RT-PCR (FLU A&B, COVID) ARPGX2  BRAIN NATRIURETIC PEPTIDE  TROPONIN I (HIGH SENSITIVITY)  TROPONIN I (HIGH SENSITIVITY)    EKG EKG Interpretation  Date/Time:  Wednesday November 16 2021 13:00:52 EDT Ventricular Rate:  80 PR Interval:  162 QRS Duration: 90 QT Interval:  416 QTC Calculation: 480 R Axis:   75 Text Interpretation: Sinus rhythm Low voltage, precordial leads Probable anteroseptal infarct, old No significant change since last tracing Confirmed by Aletta Edouard [phone]) on 11/16/2021 1:05:51 PM  Radiology DG Chest 2 View  Result Date: 11/16/2021 CLINICAL DATA:  Short of breath EXAM: CHEST - 2 VIEW COMPARISON:  05/26/2021 FINDINGS: The heart size is normal. Negative for heart failure. Approximately 5 cm fragment of wire in the left lower lobe pulmonary artery is  unchanged from prior studies. Negative for acute infiltrate or effusion. Mild scarring in the right upper lobe anteriorly. IMPRESSION: No active cardiopulmonary disease. Electronically Signed   By: Franchot Gallo M.D.   On: 11/16/2021 11:46    Procedures .Critical Care  Performed by: Adolphus Birchwood, PA-C Authorized by: Adolphus Birchwood, PA-C   Critical care provider statement:    Critical care time (minutes):  45   Critical care time was exclusive of:  Separately billable procedures and treating other patients   Critical care was necessary to treat or prevent imminent or life-threatening deterioration of the following conditions:  Respiratory failure   Critical care was time spent personally by me on the following activities:  Blood draw for specimens, discussions with primary provider, discussions with consultants, development of treatment plan with patient or surrogate, evaluation of patient's response to treatment, examination of patient, obtaining history from patient or surrogate, review of old charts, re-evaluation of patient's condition, pulse oximetry, ordering and review of radiographic studies, ordering and review of laboratory studies and ordering and performing treatments and interventions   Care discussed with: admitting provider     This patient was on telemetry or cardiac monitoring during their time in the ED.    Medications Ordered in ED Medications  albuterol (VENTOLIN HFA) 108 (90 Base) MCG/ACT inhaler 2 puff (has no administration in time range)  albuterol (PROVENTIL) (2.5 MG/3ML) 0.083% nebulizer solution (10 mg/hr Nebulization New Bag/Given 11/16/21 1415)  ipratropium-albuterol (DUONEB) 0.5-2.5 (3) MG/3ML nebulizer solution 3 mL (3 mLs Nebulization Given 11/16/21 1251)  methylPREDNISolone sodium succinate (SOLU-MEDROL) 125 mg/2 mL injection 125 mg (125 mg Intravenous Given 11/16/21 1256)    ED Course/ Medical Decision Making/ A&P Clinical Course as of 11/16/21 1543   Wed Nov 16, 8020  6810 81 year old female with history of COPD complaining of  worsening shortness of breath over the past 3 days.  She said she saw her PCP and got a shot of steroids and given prednisone tablets but they do not seem to be helping.  She said she usually gets prednisone every month or 2.  Has diffuse wheezing on exam.  Otherwise nontoxic-appearing.  Getting labs and breathing treatments.  Disposition per response to treatment. [MB]  2355 Lung sounds are less diminished after DuoNeb, but she still has significant wheezing and is requiring oxygen at 3 L now. We will do 1 hour of CAP and reassess O2 with ambulation follow labs and treatment. [GL]  7322 Discussed case with Dr. Lorin Mercy for admission.  [GL]    Clinical Course User Index [GL] Adolphus Birchwood, PA-C [MB] Hayden Rasmussen, MD                           Medical Decision Making Amount and/or Complexity of Data Reviewed Labs: ordered. Radiology: ordered.  Risk Prescription drug management. Decision regarding hospitalization.    MDM  This is a 81 y.o. female who presents to the ED with shortness of breath The differential of this patient includes but is not limited to CHF, ACS, COPD, Asthma, PNA, PE, PTX, Anxiety, Viral PNA, and Bronchitis.   My Impression, Plan, and ED Course:  Patient presents with shortness of breath and chest tightness for 3 days.  She is hemodynamically stable here and afebrile.  She is oxygenating 97% on 3 L. Exam with diminished and wheezing breath sounds bilaterally in all lobes. She does not appear hypervolemic on exam. She is already been ruled out for pulmonary embolism with a CTA that was done yesterday.  We will do DuoNeb here as well as Solu-Medrol for likely COPD exasperation  I personally ordered, reviewed, and interpreted all laboratory work and imaging and agree with radiologist interpretation. Results interpreted below: BC with stable findings.  BMP reveals stable kidney function,  and electrolytes.  BNP is 22.  Troponin 5.  Chest x-ray without any acute findings. EKG with normal sinus rhythm and no change from baseline.  He received a DuoNeb and 1 hour of Treatment.  She has also had Solu-Medrol IV for presumed COPD exasperation.  Her lung sounds have improved and she is moving air better, but she still is wheezy and is requiring 3 L of oxygen which is up from her baseline.  I did recommend the patient that she would benefit from admission for better pulmonary support. She is in agreement to this plan.  Charting Requirements Additional history is obtained from:  Independent historian External Records from outside source obtained and reviewed including: Reviewed prior labs, and CTA chest from yesterday Social Determinants of Health:  none Pertinant PMH that complicates patient's illness: COPD  Patient Care Problems that were addressed during this visit: - COPD exasperation: Acute illness with systemic symptoms - Acute hypoxic Respiratory Failure: Acute illness with systemic symptoms This patient was maintained on a cardiac monitor/telemetry. I personally viewed and interpreted the cardiac monitor which reveals an underlying rhythm of NSR Medications given in ED: Solumedrol, duo neb, 1 hour CAP Reevaluation of the patient after these medicines showed that the patient improved I have reviewed home medications and made changes accordingly.  Critical Care Interventions: O2 requirement Consultations: [facility] Disposition: Admission  This is a shared visit with my attending physician, Dr. Melina Copa.  We have discussed this patient and they have independently evaluated this patient.  The plan was altered or changed as needed.  Portions of this note were generated with [employer]. Dictation errors may occur despite best attempts at proofreading.    Final Clinical Impression(s) / ED Diagnoses Final diagnoses:  COPD exacerbation ([place])  Acute respiratory  failure with hypoxia [facility])    Rx / DC Orders ED Discharge Orders     None         Adolphus Birchwood, PA-C 11/16/21 1543    Hayden Rasmussen, MD 11/16/21 1753

## 2021-11-16 NOTE — ED Triage Notes (Signed)
Pt arrived POV from home c/o [facility] since Sunday. Pt states she has COPD and was seen on Monday and given a shot of prednisone. Pt states when she is sitting she is fine but she drops with exertion. Pt states her oxygen was 75% on RA after she showered this morning.

## 2021-11-16 NOTE — ED Notes (Signed)
Received verbal report from Christina C RN at this time 

## 2021-11-16 NOTE — H&P (Signed)
History and Physical    Patient: [name] GUR:[id number] DOB: March 31, 1941 DOA: 11/16/2021 DOS: the patient was seen and examined on 11/16/2021 PCP: [name], [place]  Patient coming from: Home - lives alone; NOK: Blane Ohara [place], [phone]    Chief Complaint: SOB  HPI: [name] is a 81 y.o. female with medical history significant of COPD (not on home O2, uses prn); PE (2020); HTN; HLD; and depression presenting with SOB.  She just "can't breathe again".  Symptoms started Saturday, worse Sunday.  Tired, weak, rundown.  She was some SOB Saturday.  No fever. She uses prn O2, rarely needs it.  She used it yesterday and last night.  She took a shower this AM and it went to 75%.  She saw her PCP Monday, got steroids and a CT and it was negative.  Mild cough, a little hoarse. Mild white mucus.    ER Course:  COPD exacerbation - wheezing, SOB x 3 days.  On 3L, 75% at home.  Given Duoneb and continuous neb, Solumedrol.       Review of Systems: As mentioned in the history of present illness. All other systems reviewed and are negative. Past Medical History:  Diagnosis Date   Allergic rhinitis    Anemia due to GI blood loss    Anxiety    Asthma    Chronic airway obstruction, not elsewhere classified    Depressive disorder, not elsewhere classified    DVT (deep venous thrombosis) (HCC)    LLE DVT 01/13/19   Dyspnea    Esophageal reflux    Essential hypertension 08/27/2019   PE (pulmonary thromboembolism) ([place]) 01/29/2019   Pneumonia    Wears partial dentures    Past Surgical History:  Procedure Laterality Date   ABDOMINAL HYSTERECTOMY     ABDOMINAL HYSTERECTOMY     CATARACT EXTRACTION W/ INTRAOCULAR LENS  IMPLANT, BILATERAL     CHOLECYSTECTOMY N/A 08/12/2021   Procedure: LAPAROSCOPIC CHOLECYSTECTOMY;  Surgeon: Felicie Morn, MD;  Location: [facility];  Service: General;  Laterality: N/A;   COLONOSCOPY     COLONOSCOPY WITH PROPOFOL N/A 08/20/2019   Procedure:  COLONOSCOPY WITH PROPOFOL;  Surgeon: Ronald Lobo, MD;  Location: [place];  Service: Endoscopy;  Laterality: N/A;   CYSTOSCOPY     DILATION AND CURETTAGE OF UTERUS     ECTROPION REPAIR Bilateral 05/12/2021   Procedure: REPAIR OF ECTROPION BY LATERAL TARSAL STRIP OF BILATERAL LOWER EYE LIDS;  Surgeon: Delia Chimes, MD;  Location: [place];  Service: Ophthalmology;  Laterality: Bilateral;   ESOPHAGOGASTRODUODENOSCOPY (EGD) WITH PROPOFOL N/A 08/20/2019   Procedure: ESOPHAGOGASTRODUODENOSCOPY (EGD) WITH PROPOFOL;  Surgeon: Ronald Lobo, MD;  Location: [place];  Service: Endoscopy;  Laterality: N/A;   HEMOSTASIS CONTROL  08/20/2019   Procedure: HEMOSTASIS CONTROL;  Surgeon: Ronald Lobo, MD;  Location: [facility];  Service: Endoscopy;;   INTRAVASCULAR ULTRASOUND/IVUS Left 05/26/2019   Procedure: Intravascular Ultrasound/IVUS;  Surgeon: Waynetta Sandy, MD;  Location: [place] [facility];  Service: Cardiovascular;  Laterality: Left;   IVC FILTER REMOVAL N/A 03/24/2019   Procedure: IVC FILTER REMOVAL;  Surgeon: Waynetta Sandy, MD;  Location: [place] [facility];  Service: Cardiovascular;  Laterality: N/A;   LACRIMAL TUBE INSERTION Right 05/12/2021   Procedure: RIGHT NASOLACRIMAL DUCT PROBE WITH MINI MONOKA STENT PLACEMENT;  Surgeon: Delia Chimes, MD;  Location: [place];  Service: Ophthalmology;  Laterality: Right;   LESION EXCISION WITH COMPLEX REPAIR N/A 05/12/2021   Procedure: NASAL LESION EXCISION;  Surgeon: Delia Chimes, MD;  Location: [place];  Service: Ophthalmology;  Laterality: N/A;   LOWER EXTREMITY VENOGRAPHY Left 05/26/2019   Procedure: LOWER EXTREMITY VENOGRAPHY;  Surgeon: Waynetta Sandy, MD;  Location: [place] [facility];  Service: Cardiovascular;  Laterality: Left;   MULTIPLE TOOTH EXTRACTIONS     NASAL SINUS SURGERY     PERIPHERAL VASCULAR INTERVENTION Left 05/26/2019   Procedure: PERIPHERAL VASCULAR INTERVENTION;  Surgeon: Waynetta Sandy, MD;  Location: [place] [facility];  Service: Cardiovascular;  Laterality: Left;  lower extremity veins   PERIPHERAL VASCULAR THROMBECTOMY  05/26/2019   Procedure: PERIPHERAL VASCULAR THROMBECTOMY;  Surgeon: Waynetta Sandy, MD;  Location: [place] [facility];  Service: Cardiovascular;;   PTOSIS REPAIR Bilateral 05/12/2021   Procedure: EXTERNAL PTOSIS REPAIR LEVATOR ADVANCEDMENT/RESECTION OF BILATERAL UPPER EYE LIDS;  Surgeon: Delia Chimes, MD;  Location: [place];  Service: Ophthalmology;  Laterality: Bilateral;   TUBAL LIGATION     VENA CAVA FILTER PLACEMENT N/A 01/13/2019   Procedure: INSERTION VENA-CAVA FILTER;  Surgeon: Waynetta Sandy, MD;  Location: [place];  Service: Vascular;  Laterality: N/A;   Social History:  reports that she quit smoking about 23 years ago. Her smoking use included cigarettes. She has a 10.00 pack-year smoking history. She has never used smokeless tobacco. She reports that she does not drink alcohol and does not use drugs.  Allergies  Allergen Reactions   Pneumococcal Vaccines Shortness Of Breath and Other (See Comments)    Couldn't breathe    Clarithromycin Swelling   Estradiol Rash    Reaction to cream   Sulfa Antibiotics Nausea And Vomiting and Swelling   Penicillins Swelling    Tongue swelling Did it involve swelling of the face/tongue/throat, SOB, or low BP? Yes Did it involve sudden or severe rash/hives, skin peeling, or any reaction on the inside of your mouth or nose? Yes Did you need to seek medical attention at a hospital or doctor's office? Yes When did it last happen?  childhood If all above answers are "NO", may proceed with cephalosporin use.    Sulfonamide Derivatives Swelling    Family History  Problem Relation Age of Onset   Diabetes Mother    Coronary artery disease Mother    Emphysema Father    Coronary artery disease Brother     Prior to Admission medications   Medication Sig Start Date End  Date Taking? Authorizing Provider  acetaminophen (TYLENOL) 500 MG tablet Take 500 mg by mouth every 6 (six) hours as needed for headache (pain).    [provider]  albuterol (PROVENTIL) (2.5 MG/3ML) 0.083% nebulizer solution Take 3 mLs (2.5 mg total) by nebulization every 6 (six) hours as needed for wheezing or shortness of breath. 06/30/19   Deneise Lever, MD  albuterol (VENTOLIN HFA) 108 (90 Base) MCG/ACT inhaler Inhale 1-2 puffs into the lungs every 6 (six) hours as needed for wheezing or shortness of breath.    [provider]  aspirin EC 81 MG tablet Take 81 mg by mouth daily. Swallow whole.    [provider]  cetirizine (ZYRTEC) 10 MG tablet Take 10 mg by mouth daily as needed for allergies.    [provider]  escitalopram (LEXAPRO) 20 MG tablet Take 20 mg by mouth daily.    [provider]  fluticasone-salmeterol (ADVAIR) 250-50 MCG/ACT AEPB Inhale 1 puff then rinse mouth, twice daily Patient taking differently: Inhale 1 puff into the lungs in the morning and at bedtime.  Inhale 1 puff then rinse mouth, twice daily 09/29/20   Baird Lyons D, MD  LORazepam (ATIVAN) 1 MG tablet Take 1 mg by mouth at bedtime.    [provider]  omeprazole (PRILOSEC) 20 MG capsule Take 20 mg by mouth daily. 03/26/20   [provider]  oxyCODONE-acetaminophen (PERCOCET) 5-325 MG tablet Take 1 tablet by mouth every 4 (four) hours as needed for severe pain. 08/12/21 08/12/22  Stechschulte, Nickola Major, MD  traZODone (DESYREL) 100 MG tablet Take 100 mg by mouth at bedtime.    [provider]  triamterene-hydrochlorothiazide (MAXZIDE-25) 37.5-25 MG tablet Take 0.5 tablets by mouth daily. 02/06/19   [provider]    Physical Exam: Vitals:   11/16/21 1415 11/16/21 1506 11/16/21 1800 11/16/21 1900  BP: 139/86 (!) 141/94 113/78 123/80  Pulse: 86 91 91 98  Resp: '14 16 15 '$ (!) 22  Temp:      TempSrc:      SpO2: 92% 94% 93% 93%  Weight:       Height:       General:  Appears calm and comfortable and is in NAD, on 3L [place] O2 Eyes:  EOMI, normal lids, iris ENT:  grossly normal hearing, lips & tongue, mmm; appropriate dentition Neck:  no LAD, masses or thyromegaly Cardiovascular:  RRR, no m/r/g. No LE edema.  Respiratory:   Scattered rhonchi.  Normal respiratory effort. Abdomen:  soft, NT, ND Back:   normal alignment Skin:  no rash or induration seen on limited exam Musculoskeletal:  grossly normal tone BUE/BLE, good ROM, no bony abnormality Psychiatric:  grossly normal mood and affect, speech fluent and appropriate, AOx3 Neurologic:  CN 2-12 grossly intact, moves all extremities in coordinated fashion   Radiological Exams on Admission: Independently reviewed - see discussion in A/P where applicable  DG Chest 2 View  Result Date: 11/16/2021 CLINICAL DATA:  Short of breath EXAM: CHEST - 2 VIEW COMPARISON:  05/26/2021 FINDINGS: The heart size is normal. Negative for heart failure. Approximately 5 cm fragment of wire in the left lower lobe pulmonary artery is unchanged from prior studies. Negative for acute infiltrate or effusion. Mild scarring in the right upper lobe anteriorly. IMPRESSION: No active cardiopulmonary disease. Electronically Signed   By: Franchot Gallo M.D.   On: 11/16/2021 11:46    EKG: Independently reviewed.   1113 - NSR with rate 86; no evidence of acute ischemia 1300 - NSR with rate 80; NSCSLT   Labs on Admission: I have personally reviewed the available labs and imaging studies at the time of the admission.  Pertinent labs:  Glucose 117 BUN 21/Creatinine 1.07/GFR 53 BNP 22.6 HS troponin 5 WBC 9.8 Hgb 15.6     Assessment and Plan: Principal Problem:   COPD with acute exacerbation (HCC) Active Problems:   History of pulmonary embolism   Essential hypertension   Anxiety and depression    Acute on chronic respiratory failure associated with a COPD exacerbation -Patient's shortness of breath and  hypoxia to 75% on RA are most likely caused by acute COPD exacerbation.  -She has history of O2-dependent COPD but wears it only prn and likely needs it all the time -She does not have fever or leukocytosis.  -Chest x-ray is not consistent with pneumonia -She was given a continuous neb treatment in the ED with some improvement.   -will observe for now and attempt to wean off [place] O2 if possible -Nebulizers: scheduled Duoneb and prn albuterol -Solu-Medrol 60 mg IV BID ->  Prednisone 40 mg PO daily; 5 days of treatment is thought to be sufficient for treatment of acute COPD exacerbations -IV Cefepime -ContinueAlbuterol HFA, Advair ([facility] formulary substitution) -Coordinated care with [facility] team/PT/OT/Nutrition/RT consults  h/o PE -Patient with prior extensive PE in 01/2019 -While this was her first PE, given that it was unprovoked and with the extensive nature of the clot hematology recommended lifelong AC but she was taken off Eliquis previously -Continue ASA 81 mg daily -Consider CTA if not improving with current therapies; she appeared quite stable on [place] O2 at the time of my evaluation after ER treatments   HTN -Continue Maxzide    Depression/anxiety -Continue Ativan, Trazodone, Lexapro      Advance Care Planning:   Code Status: Full Code   Consults: TOC team/PT/OT/Nutrition/RT  DVT Prophylaxis: Lovenox  Family Communication: Daughter-in-law was present throughout evaluation  Severity of Illness: The appropriate patient status for this patient is OBSERVATION. Observation status is judged to be reasonable and necessary in order to provide the required intensity of service to ensure the patient's safety. The patient's presenting symptoms, physical exam findings, and initial radiographic and laboratory data in the context of their medical condition is felt to place them at decreased risk for further clinical deterioration. Furthermore, it is anticipated that the patient will be medically  stable for discharge from the hospital within 2 midnights of admission.   Author: Karmen Bongo, MD 11/16/2021 7:03 PM  For on call review [email or web address].

## 2021-11-17 ENCOUNTER — Other Ambulatory Visit (HOSPITAL_COMMUNITY): Payer: Self-pay

## 2021-11-17 ENCOUNTER — Observation Stay (HOSPITAL_COMMUNITY): Payer: PPO

## 2021-11-17 DIAGNOSIS — J9811 Atelectasis: Secondary | ICD-10-CM | POA: Diagnosis not present

## 2021-11-17 DIAGNOSIS — J441 Chronic obstructive pulmonary disease with (acute) exacerbation: Secondary | ICD-10-CM | POA: Diagnosis not present

## 2021-11-17 DIAGNOSIS — J432 Centrilobular emphysema: Secondary | ICD-10-CM | POA: Diagnosis not present

## 2021-11-17 LAB — EXPECTORATED SPUTUM ASSESSMENT W GRAM STAIN, RFLX TO RESP C

## 2021-11-17 MED ORDER — METHYLPREDNISOLONE SODIUM SUCC 125 MG IJ SOLR
60.0000 mg | Freq: Two times a day (BID) | INTRAMUSCULAR | Status: DC
  Administered 2021-11-17: 60 mg via INTRAVENOUS
  Filled 2021-11-17: qty 2

## 2021-11-17 MED ORDER — LACTATED RINGERS IV SOLN: INTRAVENOUS | Status: DC

## 2021-11-17 MED ORDER — PREDNISONE 5 MG PO TABS: ORAL_TABLET | ORAL | 0 refills | Status: DC

## 2021-11-17 MED ORDER — IOHEXOL 350 MG/ML SOLN
66.0000 mL | Freq: Once | INTRAVENOUS | Status: AC | PRN
  Administered 2021-11-17: 66 mL via INTRAVENOUS

## 2021-11-17 MED ORDER — PREDNISONE 5 MG PO TABS
ORAL_TABLET | ORAL | 0 refills | Status: DC
  Filled 2021-11-17: qty 65, 18d supply, fill #0

## 2021-11-17 MED ORDER — LEVOFLOXACIN 500 MG PO TABS
500.0000 mg | ORAL_TABLET | Freq: Every day | ORAL | 0 refills | Status: AC
  Filled 2021-11-17: qty 3, 3d supply, fill #0

## 2021-11-17 NOTE — Discharge Instructions (Signed)
Follow with Primary MD [name], [name] and your pulmonologist in 7 days   Get CBC, CMP, 2 view Chest X ray -  checked next visit within 1 week by Primary MD   Activity: As tolerated with Full fall precautions use walker/cane & assistance as needed  Disposition Home    Diet: Heart Healthy    Special Instructions: If you have smoked or chewed Tobacco  in the last 2 yrs please stop smoking, stop any regular Alcohol  and or any Recreational drug use.  On your next visit with your primary care physician please Get Medicines reviewed and adjusted.  Please request your Prim.MD to go over all Hospital Tests and Procedure/Radiological results at the follow up, please get all Hospital records sent to your Prim MD by signing hospital release before you go home.  If you experience worsening of your admission symptoms, develop shortness of breath, life threatening emergency, suicidal or homicidal thoughts you must seek medical attention immediately by calling 911 or calling your MD immediately  if symptoms less severe.  You Must read complete instructions/literature along with all the possible adverse reactions/side effects for all the Medicines you take and that have been prescribed to you. Take any new Medicines after you have completely understood and accpet all the possible adverse reactions/side effects.

## 2021-11-17 NOTE — Evaluation (Signed)
Physical Therapy Evaluation and Discharge Patient Details Name: VALLERIE HENTZ MRN: [id number] DOB: 09/30/1940 Today's Date: 11/17/2021  History of Present Illness  Pt is a 81 y/o female admitted secondary to worsening SOB. Thought to be secondary to COPD exacerbation. PMH includes PE, HTN, COPD.  Clinical Impression  Patient evaluated by Physical Therapy with no further acute PT needs identified. All education has been completed and the patient has no further questions. Pt overall at an independent to mod I level with mobility tasks. No LOB noted. Mild SOB noted. Oxygen sats on RA from 89-93% and on 1L 91-95%. Pt reports family can assist as needed. See below for any follow-up Physical Therapy or equipment needs. PT is signing off. Thank you for this referral. If needs change, please re-consult.          Recommendations for follow up therapy are one component of a multi-disciplinary discharge planning process, led by the attending physician.  Recommendations may be updated based on patient status, additional functional criteria and insurance authorization.  Follow Up Recommendations No PT follow up      Assistance Recommended at Discharge Intermittent Supervision/Assistance  Patient can return home with the following  Assistance with cooking/housework    Equipment Recommendations None recommended by PT  Recommendations for Other Services       Functional Status Assessment Patient has had a recent decline in their functional status and demonstrates the ability to make significant improvements in function in a reasonable and predictable amount of time.     Precautions / Restrictions Precautions Precautions: None Restrictions Weight Bearing Restrictions: No      Mobility  Bed Mobility Overal bed mobility: Independent                  Transfers Overall transfer level: Independent                      Ambulation/Gait Ambulation/Gait assistance: Modified  independent (Device/Increase time) Gait Distance (Feet): 150 Feet Assistive device: None Gait Pattern/deviations: WFL(Within Functional Limits) Gait velocity: mildly slower     General Gait Details: Slightly slower gait, but no LOB noted. Mild SOB, but oxygen sats at 89-93% on RA throughout.  Stairs            Wheelchair Mobility    Modified Rankin (Stroke Patients Only)       Balance Overall balance assessment: No apparent balance deficits (not formally assessed)                                           Pertinent Vitals/Pain Pain Assessment Pain Assessment: No/denies pain    Home Living Family/patient expects to be discharged to:: Private residence Living Arrangements: Alone Available Help at Discharge: Family Type of Home: House Home Access: Stairs to enter Entrance Stairs-Rails: None Technical brewer of Steps: 2   Home Layout: One level Home Equipment: None      Prior Function Prior Level of Function : Independent/Modified Independent                     Hand Dominance   Dominant Hand: Right    Extremity/Trunk Assessment   Upper Extremity Assessment Upper Extremity Assessment: Overall WFL for tasks assessed    Lower Extremity Assessment Lower Extremity Assessment: Overall WFL for tasks assessed    Cervical / Trunk Assessment Cervical / Trunk Assessment:  Normal  Communication   Communication: No difficulties  Cognition Arousal/Alertness: Awake/alert Behavior During Therapy: WFL for tasks assessed/performed Overall Cognitive Status: Within Functional Limits for tasks assessed                                          General Comments General comments (skin integrity, edema, etc.): Oxygen sats on RA during abulation at 89-90% and elevated back to 93% on RA with seated rest. On 1L oxygen sats at 91-92% during mobility.    Exercises     Assessment/Plan    PT Assessment Patient does not need  any further PT services  PT Problem List         PT Treatment Interventions      PT Goals (Current goals can be found in the Care Plan section)  Acute Rehab PT Goals Patient Stated Goal: to go home PT Goal Formulation: With patient Time For Goal Achievement: 11/17/21 Potential to Achieve Goals: Good    Frequency       Co-evaluation               AM-PAC PT "6 Clicks" Mobility  Outcome Measure Help needed turning from your back to your side while in a flat bed without using bedrails?: None Help needed moving from lying on your back to sitting on the side of a flat bed without using bedrails?: None Help needed moving to and from a bed to a chair (including a wheelchair)?: None Help needed standing up from a chair using your arms (e.g., wheelchair or bedside chair)?: None Help needed to walk in hospital room?: None Help needed climbing 3-5 steps with a railing? : A Little 6 Click Score: 23    End of Session Equipment Utilized During Treatment: Gait belt Activity Tolerance: Patient tolerated treatment well Patient left: in bed;with call bell/phone within reach (on stretcher in ED) Nurse Communication: Mobility status PT Visit Diagnosis: Other abnormalities of gait and mobility (R26.89)    Time: 5400-8676 PT Time Calculation (min) (ACUTE ONLY): 15 min   Charges:   PT Evaluation $PT Eval Low Complexity: 1 Low          Lou Miner, DPT  Acute Rehabilitation Services  Office: [phone]   Rudean Hitt 11/17/2021, 10:10 AM

## 2021-11-17 NOTE — ED Notes (Signed)
Pt ambulated to restroom without assistance.

## 2021-11-17 NOTE — Discharge Summary (Addendum)
[name] HKV:[id number] DOB: 1940/10/29 DOA: 11/16/2021  PCP: [name], [name]  Admit date: 11/16/2021  Discharge date: 11/17/2021  Admitted From: Home   Disposition:  Home   Recommendations for Outpatient Follow-up:   Follow up with PCP in 1-2 weeks  PCP Please obtain BMP/CBC, 2 view CXR in 1week,  (see Discharge instructions)   PCP Please follow up on the following pending results: Please arrange for outpatient vascular surgery follow-up with [name] in 1 to [redacted] weeks along with [name] in 1 week.   Home Health: None   Equipment/Devices: None  Consultations: None  Discharge Condition: Stable    CODE STATUS: Full    Diet Recommendation: Heart Healthy     Chief Complaint  Patient presents with   Shortness of Breath     Brief history of present illness from the day of admission and additional interim summary     81 y.o. female with medical history significant of COPD (not on home O2, uses prn); PE (2020); HTN; HLD; and depression presenting with SOB.  She uses as needed oxygen at home and lately was requiring between 1 to 2 L of oxygen for several hours a day.  She was diagnosed with COPD exacerbation and admitted to the hospital                                                                 Hospital Course   Acute on chronic hypoxic respiratory failure due to COPD exacerbation.  Usually on room air at home with as needed oxygen use, but lately was requiring continuous oxygen, was hypoxic in the ER requiring oxygen supplementation up to 2-1/2 L, was diagnosed with COPD exacerbation and placed on empiric antibiotic and IV steroids, much improved this morning, on room air saturating in low 90s even upon ambulation, feels much better, CTA chest unremarkable and  nonacute except for findings of AAA for which she follows with [name].  Will be discharged home on 3 days of Levaquin and oral steroid taper with outpatient PCP and pulmonology follow-up.  She has as needed oxygen at home which she has been recommended to use as needed.   History of PE.  PE in 2020 off of Eliquis for the last 2 years.  CT unremarkable.  He has a IVC filter as well.  Hypertension.  Continue home regimen.  History of anxiety and depression.  On home regimen.  History of abdominal aneurysm.  Request PCP to make sure patient follows with [name] within 1 to 2 weeks of discharge.      Discharge diagnosis     Principal Problem:   COPD with acute exacerbation ([place]) Active Problems:   History of pulmonary embolism   Essential hypertension  Anxiety and depression    Discharge instructions    Discharge Instructions     Diet - low sodium heart healthy   Complete by: As directed    Discharge instructions   Complete by: As directed    Follow with Primary MD [name], [name] and your pulmonologist in 7 days   Get CBC, CMP, 2 view Chest X ray -  checked next visit within 1 week by Primary MD   Activity: As tolerated with Full fall precautions use walker/cane & assistance as needed  Disposition Home    Diet: Heart Healthy    Special Instructions: If you have smoked or chewed Tobacco  in the last 2 yrs please stop smoking, stop any regular Alcohol  and or any Recreational drug use.  On your next visit with your primary care physician please Get Medicines reviewed and adjusted.  Please request your Prim.MD to go over all Hospital Tests and Procedure/Radiological results at the follow up, please get all Hospital records sent to your Prim MD by signing hospital release before you go home.  If you experience worsening of your admission symptoms, develop shortness of breath, life threatening emergency, suicidal or homicidal thoughts you  must seek medical attention immediately by calling 911 or calling your MD immediately  if symptoms less severe.  You Must read complete instructions/literature along with all the possible adverse reactions/side effects for all the Medicines you take and that have been prescribed to you. Take any new Medicines after you have completely understood and accpet all the possible adverse reactions/side effects.   Increase activity slowly   Complete by: As directed        Discharge Medications   Allergies as of 11/17/2021       Reactions   Pneumococcal Vaccines Shortness Of Breath, Other (See Comments)   Couldn't breathe   Clarithromycin Swelling   Estradiol Rash   Reaction to cream   Sulfa Antibiotics Nausea And Vomiting, Swelling   Penicillins Swelling   Tongue swelling Did it involve swelling of the face/tongue/throat, SOB, or low BP? Yes Did it involve sudden or severe rash/hives, skin peeling, or any reaction on the inside of your mouth or nose? Yes Did you need to seek medical attention at a hospital or doctor's office? Yes When did it last happen?  childhood If all above answers are "NO", may proceed with cephalosporin use.   Sulfonamide Derivatives Swelling        Medication List     STOP taking these medications    oxyCODONE-acetaminophen 5-325 MG tablet Commonly known as: Percocet       TAKE these medications    acetaminophen 500 MG tablet Commonly known as: TYLENOL Take 500 mg by mouth every 6 (six) hours as needed for headache (pain).   albuterol 108 (90 Base) MCG/ACT inhaler Commonly known as: VENTOLIN HFA Inhale 1-2 puffs into the lungs every 6 (six) hours as needed for wheezing or shortness of breath.   albuterol 0.63 MG/3ML nebulizer solution Commonly known as: ACCUNEB Take 1 ampule by nebulization every 6 (six) hours as needed for shortness of breath or wheezing.   aspirin EC 81 MG tablet Take 81 mg by mouth daily. Swallow whole.   cetirizine 10 MG  tablet Commonly known as: ZYRTEC Take 10 mg by mouth daily as needed for allergies.   escitalopram 20 MG tablet Commonly known as: LEXAPRO Take 20 mg by mouth daily.   fluticasone-salmeterol 250-50 MCG/ACT Aepb Commonly known as: ADVAIR Inhale  1 puff then rinse mouth, twice daily What changed:  how much to take how to take this when to take this   levofloxacin 500 MG tablet Commonly known as: Levaquin Take 1 tablet (500 mg total) by mouth daily for 3 days.   LORazepam 1 MG tablet Commonly known as: ATIVAN Take 1 mg by mouth at bedtime.   omeprazole 20 MG capsule Commonly known as: PRILOSEC Take 20 mg by mouth daily.   predniSONE 5 MG tablet Commonly known as: DELTASONE Label  & dispense according to the schedule below. take 8 Pills PO for 3 days, 6 Pills PO for 3 days, 4 Pills PO for 3 days, 2 Pills PO for 3 days, 1 Pills PO for 3 days, 1/2 Pill  PO for 3 days then STOP. Total 65 pills.   traZODone 100 MG tablet Commonly known as: DESYREL Take 100 mg by mouth at bedtime.   triamterene-hydrochlorothiazide 37.5-25 MG tablet Commonly known as: MAXZIDE-25 Take 0.5 tablets by mouth daily.         Follow-up Information     [name], [name]. Schedule an appointment as soon as possible for a visit in 1 week(s).   Specialty: Internal Medicine Why: Also with your pulmonologist within a week Contact information: [address] [place] [address] [phone]         Waynetta Sandy, MD. Schedule an appointment as soon as possible for a visit in 2 week(s).   Specialties: Vascular Surgery, Cardiology Contact information: [address] [place] [address] [phone]                 Major procedures and Radiology Reports - PLEASE review detailed and final reports thoroughly  -       CT Angio Chest Pulmonary Embolism (PE) W or WO Contrast  Addendum Date: 11/17/2021   ADDENDUM REPORT: 11/17/2021 07:06 ADDENDUM: As  noted in the body of the report, the diameter of the ascending thoracic aorta is 4.0 cm. Recommend annual imaging followup by CTA or MRA. This recommendation follows 2010 ACCF/AHA/AATS/ACR/ASA/SCA/SCAI/SIR/STS/SVM Guidelines for the Diagnosis and Management of Patients with Thoracic Aortic Disease. Circulation. 2010; 121: V371-G626. Aortic aneurysm NOS (ICD10-I71.9) Electronically Signed   By: Misty Stanley M.D.   On: 11/17/2021 07:06   Result Date: 11/17/2021 CLINICAL DATA:  High probability for pulmonary embolism. EXAM: CT ANGIOGRAPHY CHEST WITH CONTRAST TECHNIQUE: Multidetector CT imaging of the chest was performed using the standard protocol during bolus administration of intravenous contrast. Multiplanar CT image reconstructions and MIPs were obtained to evaluate the vascular anatomy. RADIATION DOSE REDUCTION: This exam was performed according to the departmental dose-optimization program which includes automated exposure control, adjustment of the mA and/or kV according to patient size and/or use of iterative reconstruction technique. CONTRAST:  64m OMNIPAQUE IOHEXOL 350 MG/ML SOLN COMPARISON:  01/14/2021 FINDINGS: Cardiovascular: The heart size is normal. No substantial pericardial effusion. Coronary artery calcification is evident. Mild atherosclerotic calcification is noted in the wall of the thoracic aorta. Ascending thoracic aorta measures 4 cm diameter. There is no filling defect within the opacified pulmonary arteries to suggest the presence of an acute pulmonary embolus. Mediastinum/Nodes: No mediastinal lymphadenopathy. There is no hilar lymphadenopathy. Tiny hiatal hernia. The esophagus has normal imaging features. There is no axillary lymphadenopathy. Lungs/Pleura: Centrilobular emphsyema noted. Subsegmental atelectasis noted right middle lobe and lingula. No suspicious pulmonary nodule or mass. No focal airspace consolidation. No pleural effusion. Upper Abdomen: 5.5 cm water density lesion  interpolar left kidney  incompletely visualized but compatible with a cyst and stable since abdomen CT 11/22/2019. Musculoskeletal: No worrisome lytic or sclerotic osseous abnormality. Review of the MIP images confirms the above findings. IMPRESSION: 1. No CT evidence for acute pulmonary embolus. 2. Tiny hiatal hernia. 3. Aortic Atherosclerosis (ICD10-I70.0) and Emphysema (ICD10-J43.9). Electronically Signed: By: Misty Stanley M.D. On: 11/17/2021 06:14   DG Chest 2 View  Result Date: 11/16/2021 CLINICAL DATA:  Short of breath EXAM: CHEST - 2 VIEW COMPARISON:  05/26/2021 FINDINGS: The heart size is normal. Negative for heart failure. Approximately 5 cm fragment of wire in the left lower lobe pulmonary artery is unchanged from prior studies. Negative for acute infiltrate or effusion. Mild scarring in the right upper lobe anteriorly. IMPRESSION: No active cardiopulmonary disease. Electronically Signed   By: Franchot Gallo M.D.   On: 11/16/2021 11:46    Today   Subjective    [name] today has no headache,no chest abdominal pain,no new weakness tingling or numbness, feels much better wants to go home today.     Objective   Blood pressure 165/82, pulse 83, temperature 98.1 F (36.7 C), temperature source Oral, resp. rate 20, height '5\' 6"'$  (1.676 m), weight 74.8 kg, SpO2 94 %.  No intake or output data in the 24 hours ending 11/17/21 1042  Exam  Awake Alert, No new F.N deficits,    [place].AT,PERRAL Supple Neck,   Symmetrical Chest wall movement, Good air movement bilaterally, CTAB RRR,No Gallops,   +ve B.Sounds, Abd Soft, Non tender,  No Cyanosis, Clubbing or edema    Data Review   Recent Labs  Lab 11/16/21 1302  WBC 9.8  HGB 15.6*  HCT 49.4*  PLT 353  MCV 95.2  MCH 30.1  MCHC 31.6  RDW 13.3  LYMPHSABS 0.4*  MONOABS 0.4  EOSABS 0.0  BASOSABS 0.0    Recent Labs  Lab 11/16/21 1302  NA 138  K 4.8  CL 101  CO2 26  GLUCOSE 117*  BUN 21  CREATININE 1.07*  CALCIUM 9.0  BNP  22.6    Total Time in preparing paper work, data evaluation and todays exam - 1 minutes  [name] M.D on 11/17/2021 at 10:42 AM  [facility]

## 2021-11-17 NOTE — Progress Notes (Signed)
OT Cancellation Note  Patient Details Name: LILLIEMAE FRUGE MRN: [id number] DOB: 25-Jun-1940   Cancelled Treatment:    Reason Eval/Treat Not Completed: OT screened, no needs identified, will sign off. PT reports that pt is at her baseline, able to complete functional mobility and BADL's with no assist, safely. No Acute OT needs identified, OT will sign off.   Severus Brodzinski Elane Gladies Sofranko 11/17/2021, 10:51 AM

## 2021-11-17 NOTE — ED Notes (Signed)
Pt set up to eat breakfast

## 2021-11-20 LAB — CULTURE, RESPIRATORY W GRAM STAIN: Culture: NORMAL

## 2021-11-21 ENCOUNTER — Telehealth: Payer: Self-pay | Admitting: Emergency Medicine

## 2021-11-23 NOTE — Progress Notes (Signed)
Patient ID: [name], female    DOB: February 27, 1941, 81 y.o.   MRN: [id number]  HPI female former smoker followed for COPD, allergic rhinitis, complicated by GERD, chronic back pain Office Spirometry 12/16/13- Severe obstructive airways disease, FVC 2.13/ 65%, FEV1 1.08/ 44%, FEV1/FVC 0.51, FEF25-75% 0.47/ 22%. History of intense reaction shortly after receiving pneumonia vaccine, leading to hospitalization. She is considered "allergic" and will not take pneumonia vaccine Office Spirometry 06/22/2016-severe obstructive airways disease. FVC 2.44/78%, FEV1 1.04/44%, ratio 0.43, FEF 25-75% 0.39/21%  ----------------------------------------------------------------   05/26/21- 81 year old female former smoker followed for COPD, Chronic Hypoxic Resp Failure,  allergic rhinitis, complicated by GERD, chronic back pain, PE 01/28/2019/  DVT(L)/, lower GI bleed> dc'd Eliquis, Covid pneumonia Jan 2022, CAD,  -Neb albuterol, albuterol hfa, Advair 250, Spiriva 2.5 Respimat,  O2 2-3L/ [facility] Covid vax-2 Phizer Flu vax-had -----Patient states her throat has been sore, productive cough with green/yellow sputum, laryngitis. Went to PCP and was given steroid injection and cough syrup. They did not do CXR. No fever Got cefdinir here Nov 9. Recovered from illness in November.  In the past week again 6-sweats, chills, cough productive scant yellow, hard coughing, weakness.  Her primary office gave Depo-Medrol.  She feels that was insufficient.  11/24/21- 81 year old female former smoker followed for COPD, Chronic Hypoxic Resp Failure,  allergic rhinitis, complicated by GERD, chronic back pain, PE 01/28/2019/  DVT(L)/, lower GI bleed>  IVC filter, dc'd Eliquis/, Covid pneumonia Jan 2022, CAD, Abdominal Aortic Aneurysm, -Neb albuterol, albuterol hfa, Advair 250, Spiriva 2.5 Respimat,  O2 2-3L/ [facility]   Covid vax-2 Phizer Flu vax-had Hosp 6/21-6/22- COPD exacerbation > needs f/u CXR, BMP, CBC> Levaquin, pred taper.   Left O2 in car. Slow and dyspneic here with arrival sat 91%.  Complains that it is taking a long time to regain her exercise tolerance for ADLs.  Using her oxygen only occasionally-discussed.  Mostly dry cough, some scant white sputum.  We discussed trial adding theophylline and desire to minimize systemic steroid therapy. CTachest 11/17/21- IMPRESSION: 1. No CT evidence for acute pulmonary embolus. 2. Tiny hiatal hernia. 3. Aortic Atherosclerosis (ICD10-I70.0) and Emphysema (ICD10-J43.9).   ROS-see HPI    + = positive Constitutional:   No-   weight loss, night sweats, fevers, chills, fatigue, lassitude. HEENT:   No-  headaches, difficulty swallowing, tooth/dental problems, sore throat,       No- sneezing, itching, ear ache,nasal congestion, +post nasal drip,  CV:  No-   chest pain, orthopnea, PND, swelling in lower extremities, anasarca, dizziness, palpitations Resp: + shortness of breath with exertion or at rest.              No-   productive cough,  + non-productive cough,  No- coughing up of blood.              No-   change in color of mucus.  +occasional mild wheezing.   Skin: No-   rash or lesions. GI:  No-   heartburn, indigestion, abdominal pain, nausea, vomiting,  GU:  MS:  No-   joint pain or swelling. + Acute/chronic back pain Neuro-     nothing unusual Psych:  No- change in mood or affect. No depression or anxiety.  No memory loss.  OBJ- Physical Exam General- Alert, Oriented, Affect-appropriate, Distress- none acute Skin- rash-none, lesions- none, excoriation- none Lymphadenopathy- none Head- atraumatic            Eyes- Gross vision intact, PERRLA, conjunctivae and secretions  clear            Ears- Hearing, canals-normal            Nose- Clear, no-Septal dev, mucus, polyps, erosion, perforation             Throat- Mallampati II , mucosa clear , drainage- none, tonsils- atrophic Neck- flexible , trachea midline, no stridor , thyroid nl, carotid no bruit Chest -  symmetrical excursion , unlabored           Heart/CV- RRR , no murmur , no gallop  , no rub, nl s1 s2                           - JVD- none , edema- none, stasis changes- none, varices- none           Lung-  +distant, unlabored, cough- none, wheeze-+, rhonchi-none  , dullness-none, rub- none           Chest wall-  Abd-  Br/ Gen/ Rectal- Not done, not indicated Extrem- cyanosis- none, clubbing, none, atrophy- none, strength- nl Neuro- +slight droop L eye.

## 2021-11-24 ENCOUNTER — Encounter: Payer: Self-pay | Admitting: Internal Medicine

## 2021-11-24 ENCOUNTER — Telehealth: Payer: Self-pay | Admitting: Internal Medicine

## 2021-11-24 ENCOUNTER — Ambulatory Visit: Payer: PPO | Admitting: Internal Medicine

## 2021-11-24 DIAGNOSIS — J441 Chronic obstructive pulmonary disease with (acute) exacerbation: Secondary | ICD-10-CM

## 2021-11-24 DIAGNOSIS — J9611 Chronic respiratory failure with hypoxia: Secondary | ICD-10-CM | POA: Diagnosis not present

## 2021-11-24 MED ORDER — THEOPHYLLINE ER 400 MG PO CP24: 400.0000 mg | ORAL_CAPSULE | Freq: Every day | ORAL | 3 refills | Status: DC

## 2021-11-24 MED ORDER — THEOPHYLLINE ER 200 MG PO TB12: ORAL_TABLET | ORAL | 3 refills | Status: DC

## 2021-11-24 NOTE — Telephone Encounter (Signed)
Called and spoke with pt and stated to her to begin Rx once she is able to get it from pharmacy and she verbalized understanding. Stated to her to take all current meds as directed. Nothing further needed.

## 2021-11-24 NOTE — Telephone Encounter (Signed)
pharmacy: [facility] on [address]

## 2021-11-24 NOTE — Telephone Encounter (Signed)
Yes thanks- those changes would be fine

## 2021-11-24 NOTE — Telephone Encounter (Signed)
New RX has been sent to pharmacy.

## 2021-11-24 NOTE — Patient Instructions (Signed)
Continue present meds and your prednisone taper as scheduled.  Continue oxygen- Ok to wear it all the time if you need to.  Script sent to add theophylline for your breathing. Take it with food.   I hope you begin to feel better soon.

## 2021-11-24 NOTE — Telephone Encounter (Signed)
Called and spoke with Nira Conn at [facility]. She stated that the theophylline only comes in a '400mg'$  ER tablet. She wanted to know if [name] would be willing to switch the patient to the '400mg'$  once daily dose and change the quantity to 30 tablets instead of 60 tablets.   [name], can you please advise if you are ok with this switch? Thanks!

## 2021-11-25 DIAGNOSIS — J449 Chronic obstructive pulmonary disease, unspecified: Secondary | ICD-10-CM | POA: Diagnosis not present

## 2021-11-28 DIAGNOSIS — G4734 Idiopathic sleep related nonobstructive alveolar hypoventilation: Secondary | ICD-10-CM | POA: Diagnosis not present

## 2021-11-28 DIAGNOSIS — J449 Chronic obstructive pulmonary disease, unspecified: Secondary | ICD-10-CM | POA: Diagnosis not present

## 2021-11-28 DIAGNOSIS — M7989 Other specified soft tissue disorders: Secondary | ICD-10-CM | POA: Diagnosis not present

## 2021-12-01 DIAGNOSIS — M7989 Other specified soft tissue disorders: Secondary | ICD-10-CM | POA: Diagnosis not present

## 2021-12-01 DIAGNOSIS — Z86711 Personal history of pulmonary embolism: Secondary | ICD-10-CM | POA: Diagnosis not present

## 2021-12-01 DIAGNOSIS — I808 Phlebitis and thrombophlebitis of other sites: Secondary | ICD-10-CM | POA: Diagnosis not present

## 2021-12-01 DIAGNOSIS — J449 Chronic obstructive pulmonary disease, unspecified: Secondary | ICD-10-CM | POA: Diagnosis not present

## 2021-12-01 DIAGNOSIS — R053 Chronic cough: Secondary | ICD-10-CM | POA: Diagnosis not present

## 2021-12-01 DIAGNOSIS — R229 Localized swelling, mass and lump, unspecified: Secondary | ICD-10-CM | POA: Diagnosis not present

## 2021-12-01 DIAGNOSIS — Z86718 Personal history of other venous thrombosis and embolism: Secondary | ICD-10-CM | POA: Diagnosis not present

## 2021-12-02 ENCOUNTER — Telehealth: Payer: Self-pay

## 2021-12-02 NOTE — Telephone Encounter (Signed)
Pt called stating that she has a new blood clot that was found yesterday and she needed to make an appt.  Reviewed pt's chart, spoke with [facility] PA, returned pt's call, two identifiers used. Informed her that the provider recommended warm compresses and short-term anti-inflammatories for pain and swelling. No f/u appt in this office needed, f/u with PCP PRN. Pt advised to elevate arm to help with swelling. Confirmed understanding.

## 2021-12-08 ENCOUNTER — Encounter: Payer: Self-pay | Admitting: Internal Medicine

## 2021-12-08 ENCOUNTER — Ambulatory Visit: Payer: PPO | Admitting: Internal Medicine

## 2021-12-08 VITALS — BP 130/82 | HR 89 | Temp 97.8°F | Ht 66.0 in | Wt 168.2 lb

## 2021-12-08 DIAGNOSIS — J9611 Chronic respiratory failure with hypoxia: Secondary | ICD-10-CM

## 2021-12-08 DIAGNOSIS — J441 Chronic obstructive pulmonary disease with (acute) exacerbation: Secondary | ICD-10-CM | POA: Diagnosis not present

## 2021-12-08 DIAGNOSIS — J449 Chronic obstructive pulmonary disease, unspecified: Secondary | ICD-10-CM | POA: Diagnosis not present

## 2021-12-08 DIAGNOSIS — R0609 Other forms of dyspnea: Secondary | ICD-10-CM

## 2021-12-08 MED ORDER — METHYLPREDNISOLONE ACETATE 80 MG/ML IJ SUSP
120.0000 mg | Freq: Once | INTRAMUSCULAR | Status: AC
  Administered 2021-12-08: 120 mg via INTRAMUSCULAR

## 2021-12-08 MED ORDER — DOXYCYCLINE HYCLATE 100 MG PO TABS: 100.0000 mg | ORAL_TABLET | Freq: Two times a day (BID) | ORAL | 0 refills | Status: DC

## 2021-12-08 NOTE — Progress Notes (Signed)
Patient ID: [name], female    DOB: 04/11/1941, 81 y.o.   MRN: [id number]  HPI female former smoker followed for COPD, allergic rhinitis, complicated by GERD, chronic back pain Office Spirometry 12/16/13- Severe obstructive airways disease, FVC 2.13/ 65%, FEV1 1.08/ 44%, FEV1/FVC 0.51, FEF25-75% 0.47/ 22%. History of intense reaction shortly after receiving pneumonia vaccine, leading to hospitalization. She is considered "allergic" and will not take pneumonia vaccine Office Spirometry 06/22/2016-severe obstructive airways disease. FVC 2.44/78%, FEV1 1.04/44%, ratio 0.43, FEF 25-75% 0.39/21%  ----------------------------------------------------------------   11/24/21- 81 year old female former smoker followed for COPD, Chronic Hypoxic Resp Failure,  allergic rhinitis, complicated by GERD, chronic back pain, PE 01/28/2019/  DVT(L)/, lower GI bleed>  IVC filter, dc'd Eliquis/, Covid pneumonia Jan 2022, CAD, Abdominal Aortic Aneurysm, -Neb albuterol, albuterol hfa, Advair 250, Spiriva 2.5 Respimat,  O2 2-3L/ [facility]   Covid vax-2 [facility]izer Flu vax-had Hosp 6/21-6/22- COPD exacerbation > needs f/u CXR, BMP, CBC> Levaquin, pred taper.  Left O2 in car. Slow and dyspneic here with arrival sat 91%.  CTachest 11/17/21- IMPRESSION: 1. No CT evidence for acute pulmonary embolus. 2. Tiny hiatal hernia. 3. Aortic Atherosclerosis (ICD10-I70.0) and Emphysema (ICD10-J43.9).  12/08/21- 81 year old female former smoker followed for COPD, Chronic Hypoxic Resp Failure,  allergic rhinitis, complicated by GERD, chronic back pain, PE 01/28/2019/  DVT(L)/, lower GI bleed>  IVC filter, dc'd Eliquis/, Covid pneumonia Jan 2022, CAD, Abdominal Aortic Aneurysm, -Neb albuterol, albuterol hfa, Advair 250, Spiriva 2.5 Respimat, theophylline 400 O2 2-3L/ [facility]   Covid vax-2 [facility]izer ------Patient went to the ED on 6/21 for her COPD and then was seen here on 6/29. She states that she is not feeling any better. Still very  short of breath with all exertion. Dry cough We had added theophylline last visit.  She complains of persistent intrusive dyspnea on exertion.  Uses oxygen at night and sometimes during the day but again did not bring it into the office.  Admits it does help..  Little cough is mostly dry.  No exertional chest pain or ankle edema.  Current outdoor heat and humidity are contributing.  No acute events. Was on Eliquis for a while after clot in left arm.  ROS-see HPI    + = positive Constitutional:   No-   weight loss, night sweats, fevers, chills, fatigue, lassitude. HEENT:   No-  headaches, difficulty swallowing, tooth/dental problems, sore throat,       No- sneezing, itching, ear ache,nasal congestion, +post nasal drip,  CV:  No-   chest pain, orthopnea, PND, swelling in lower extremities, anasarca, dizziness, palpitations Resp: + shortness of breath with exertion or at rest.              No-   productive cough,  + non-productive cough,  No- coughing up of blood.              No-   change in color of mucus.  +occasional mild wheezing.   Skin: No-   rash or lesions. GI:  No-   heartburn, indigestion, abdominal pain, nausea, vomiting,  GU:  MS:  No-   joint pain or swelling. + Acute/chronic back pain Neuro-     nothing unusual Psych:  No- change in mood or affect. No depression or anxiety.  No memory loss.  OBJ- Physical Exam General- Alert, Oriented, Affect-appropriate, Distress- none acute Skin- rash-none, lesions- none, excoriation- none Lymphadenopathy- none Head- atraumatic            Eyes-  Gross vision intact, PERRLA, conjunctivae and secretions clear            Ears- Hearing, canals-normal            Nose- Clear, no-Septal dev, mucus, polyps, erosion, perforation             Throat- Mallampati II , mucosa clear , drainage- none, tonsils- atrophic Neck- flexible , trachea midline, no stridor , thyroid nl, carotid no bruit Chest - symmetrical excursion , unlabored           Heart/CV-  RRR , no murmur , no gallop  , no rub, nl s1 s2                           - JVD- none , edema- none, stasis changes- none, varices- none           Lung-  +distant, unlabored, cough- none, wheeze-+, rhonchi+R apex  , dullness-none, rub- none           Chest wall-  Abd-  Br/ Gen/ Rectal- Not done, not indicated Extrem- cyanosis- none, clubbing, none, atrophy- none, strength- nl Neuro- +slight droop L eye.

## 2021-12-08 NOTE — Patient Instructions (Signed)
Script for doxycycline sent  Order lab- D-dimer, Theophylline level    dx Dyspnea on exertion  Order- Depo 120   dx COPD exacerbation  Hope this helps you. Use the oxygen at 2-3 l when active

## 2021-12-09 LAB — D-DIMER, QUANTITATIVE: D-Dimer, Quant: 1.12 mcg/mL FEU — ABNORMAL HIGH (ref <0.50)

## 2021-12-09 LAB — THEOPHYLLINE LEVEL: Theophylline Lvl: 10 mg/L (ref 10.0–20.0)

## 2021-12-12 ENCOUNTER — Other Ambulatory Visit: Payer: Self-pay | Admitting: *Deleted

## 2021-12-12 ENCOUNTER — Ambulatory Visit (HOSPITAL_COMMUNITY)
Admission: RE | Admit: 2021-12-12 | Discharge: 2021-12-12 | Disposition: A | Discharge status: Home / Self Care | Payer: PPO | Source: Ambulatory Visit | Attending: Internal Medicine | Admitting: Internal Medicine

## 2021-12-12 ENCOUNTER — Telehealth: Payer: Self-pay | Admitting: Internal Medicine

## 2021-12-12 DIAGNOSIS — J439 Emphysema, unspecified: Secondary | ICD-10-CM | POA: Diagnosis not present

## 2021-12-12 DIAGNOSIS — R079 Chest pain, unspecified: Secondary | ICD-10-CM | POA: Diagnosis not present

## 2021-12-12 DIAGNOSIS — J9811 Atelectasis: Secondary | ICD-10-CM | POA: Diagnosis not present

## 2021-12-12 DIAGNOSIS — R7989 Other specified abnormal findings of blood chemistry: Secondary | ICD-10-CM | POA: Diagnosis not present

## 2021-12-12 DIAGNOSIS — R0609 Other forms of dyspnea: Secondary | ICD-10-CM | POA: Diagnosis not present

## 2021-12-12 MED ORDER — IOHEXOL 350 MG/ML SOLN
75.0000 mL | Freq: Once | INTRAVENOUS | Status: AC | PRN
  Administered 2021-12-12: 75 mL via INTRAVENOUS

## 2021-12-12 NOTE — Telephone Encounter (Signed)
Seems like encounter was open in error so closing encounter.  

## 2021-12-25 DIAGNOSIS — J449 Chronic obstructive pulmonary disease, unspecified: Secondary | ICD-10-CM | POA: Diagnosis not present

## 2021-12-27 ENCOUNTER — Telehealth: Payer: Self-pay | Admitting: Internal Medicine

## 2021-12-27 DIAGNOSIS — J441 Chronic obstructive pulmonary disease with (acute) exacerbation: Secondary | ICD-10-CM

## 2021-12-27 MED ORDER — LEVOFLOXACIN 750 MG PO TABS: 750.0000 mg | ORAL_TABLET | Freq: Every day | ORAL | 0 refills | Status: DC

## 2021-12-27 NOTE — Telephone Encounter (Signed)
Spoke with the pt  She states that her cough never improved with recent doxy given at [facility] 12/08/21  She is coughing more over the past few days- prod with large amounts of yellow sputum  She is also wheezing  She has minimal SOB  She is taking her advair, theophylline, albuterol inhaler and nebs  She feels nothing is helping  She denies any fever or aches  Please advise, thanks!  Allergies  Allergen Reactions   Pneumococcal Vaccines Shortness Of Breath and Other (See Comments)    Couldn't breathe    Clarithromycin Swelling   Estradiol Rash    Reaction to cream   Sulfa Antibiotics Nausea And Vomiting and Swelling   Penicillins Swelling    Tongue swelling Did it involve swelling of the face/tongue/throat, SOB, or low BP? Yes Did it involve sudden or severe rash/hives, skin peeling, or any reaction on the inside of your mouth or nose? Yes Did you need to seek medical attention at a hospital or doctor's office? Yes When did it last happen?  childhood If all above answers are "NO", may proceed with cephalosporin use.    Sulfonamide Derivatives Swelling   Current Outpatient Medications on File Prior to Visit  Medication Sig Dispense Refill   acetaminophen (TYLENOL) 500 MG tablet Take 500 mg by mouth every 6 (six) hours as needed for headache (pain).     albuterol (ACCUNEB) 0.63 MG/3ML nebulizer solution Take 1 ampule by nebulization every 6 (six) hours as needed for shortness of breath or wheezing.     albuterol (VENTOLIN HFA) 108 (90 Base) MCG/ACT inhaler Inhale 1-2 puffs into the lungs every 6 (six) hours as needed for wheezing or shortness of breath.     aspirin EC 81 MG tablet Take 81 mg by mouth daily. Swallow whole.     cetirizine (ZYRTEC) 10 MG tablet Take 10 mg by mouth daily as needed for allergies.     doxycycline (VIBRA-TABS) 100 MG tablet Take 1 tablet (100 mg total) by mouth 2 (two) times daily. 14 tablet 0   escitalopram (LEXAPRO) 20 MG tablet Take 20 mg by mouth daily.      fluticasone-salmeterol (ADVAIR) 250-50 MCG/ACT AEPB Inhale 1 puff then rinse mouth, twice daily (Patient taking differently: Inhale 1 puff into the lungs in the morning and at bedtime. Inhale 1 puff then rinse mouth, twice daily) 180 each 4   LORazepam (ATIVAN) 1 MG tablet Take 1 mg by mouth at bedtime.     omeprazole (PRILOSEC) 20 MG capsule Take 20 mg by mouth daily.     theophylline (THEO-24) 400 MG 24 hr capsule Take 1 capsule (400 mg total) by mouth daily. 30 capsule 3   traZODone (DESYREL) 100 MG tablet Take 100 mg by mouth at bedtime.     triamterene-hydrochlorothiazide (MAXZIDE-25) 37.5-25 MG tablet Take 0.5 tablets by mouth daily.     No current facility-administered medications on file prior to visit.

## 2021-12-27 NOTE — Telephone Encounter (Signed)
I) please get sputum culture started- routine, fungal and AFB smear and culture for dx COPD exacerbation 2) THEN, levaquin 750 mg, # 7, 1 daily  Thanks

## 2021-12-27 NOTE — Telephone Encounter (Signed)
I called and spoke with the pt and notified of response per Dr Annamaria Boots. She verbalized understanding.She is aware to not take abx until provides [place] with sputum. Rx sent and lab orders placed. I put 2 specimen cups at the front with instruction sheet included.

## 2021-12-28 ENCOUNTER — Other Ambulatory Visit: Payer: PPO

## 2021-12-28 DIAGNOSIS — J441 Chronic obstructive pulmonary disease with (acute) exacerbation: Secondary | ICD-10-CM | POA: Diagnosis not present

## 2021-12-29 ENCOUNTER — Other Ambulatory Visit: Payer: PPO

## 2021-12-29 DIAGNOSIS — J441 Chronic obstructive pulmonary disease with (acute) exacerbation: Secondary | ICD-10-CM | POA: Diagnosis not present

## 2021-12-30 ENCOUNTER — Encounter: Payer: Self-pay | Admitting: Internal Medicine

## 2021-12-30 NOTE — Assessment & Plan Note (Signed)
Severe COPD not bouncing back easily after exacerbation Plan-medication review.  Trial of theophylline.  Use oxygen as directed.

## 2021-12-30 NOTE — Assessment & Plan Note (Signed)
Educated again on use of oxygen. Plan-sleep with oxygen.  Use oxygen 2 L with exertion.

## 2022-01-03 DIAGNOSIS — R5383 Other fatigue: Secondary | ICD-10-CM | POA: Diagnosis not present

## 2022-01-03 DIAGNOSIS — Z7901 Long term (current) use of anticoagulants: Secondary | ICD-10-CM | POA: Diagnosis not present

## 2022-01-03 DIAGNOSIS — Z862 Personal history of diseases of the blood and blood-forming organs and certain disorders involving the immune mechanism: Secondary | ICD-10-CM | POA: Diagnosis not present

## 2022-01-03 DIAGNOSIS — R0902 Hypoxemia: Secondary | ICD-10-CM | POA: Diagnosis not present

## 2022-01-03 DIAGNOSIS — Z86711 Personal history of pulmonary embolism: Secondary | ICD-10-CM | POA: Diagnosis not present

## 2022-01-03 DIAGNOSIS — J441 Chronic obstructive pulmonary disease with (acute) exacerbation: Secondary | ICD-10-CM | POA: Diagnosis not present

## 2022-01-12 DIAGNOSIS — J449 Chronic obstructive pulmonary disease, unspecified: Secondary | ICD-10-CM | POA: Diagnosis not present

## 2022-01-12 DIAGNOSIS — J189 Pneumonia, unspecified organism: Secondary | ICD-10-CM | POA: Diagnosis not present

## 2022-01-12 DIAGNOSIS — E538 Deficiency of other specified B group vitamins: Secondary | ICD-10-CM | POA: Diagnosis not present

## 2022-01-12 DIAGNOSIS — R5383 Other fatigue: Secondary | ICD-10-CM | POA: Diagnosis not present

## 2022-01-12 DIAGNOSIS — T148XXA Other injury of unspecified body region, initial encounter: Secondary | ICD-10-CM | POA: Diagnosis not present

## 2022-01-20 NOTE — Assessment & Plan Note (Signed)
Severe COPD.  Normal progression with the marked by occasional stepwise exacerbation. Plan-D-dimer, theophylline level, Depo-Medrol 120 mg

## 2022-01-20 NOTE — Assessment & Plan Note (Signed)
She benefits from oxygen and should use it more routinely with exertion.  Continue to sleep with 2-3 L.

## 2022-01-23 ENCOUNTER — Emergency Department (HOSPITAL_COMMUNITY): Payer: PPO

## 2022-01-23 ENCOUNTER — Inpatient Hospital Stay (HOSPITAL_COMMUNITY)
Admission: EM | Admit: 2022-01-23 | Discharge: 2022-01-26 | DRG: 193 | Disposition: A | Discharge status: Home / Self Care | Payer: PPO | Attending: Internal Medicine | Admitting: Internal Medicine

## 2022-01-23 ENCOUNTER — Other Ambulatory Visit: Payer: Self-pay

## 2022-01-23 ENCOUNTER — Encounter (HOSPITAL_COMMUNITY): Payer: Self-pay

## 2022-01-23 DIAGNOSIS — Z86718 Personal history of other venous thrombosis and embolism: Secondary | ICD-10-CM

## 2022-01-23 DIAGNOSIS — I10 Essential (primary) hypertension: Secondary | ICD-10-CM | POA: Diagnosis not present

## 2022-01-23 DIAGNOSIS — F32A Depression, unspecified: Secondary | ICD-10-CM | POA: Diagnosis not present

## 2022-01-23 DIAGNOSIS — I2609 Other pulmonary embolism with acute cor pulmonale: Secondary | ICD-10-CM | POA: Diagnosis present

## 2022-01-23 DIAGNOSIS — I825Y2 Chronic embolism and thrombosis of unspecified deep veins of left proximal lower extremity: Secondary | ICD-10-CM | POA: Diagnosis not present

## 2022-01-23 DIAGNOSIS — R042 Hemoptysis: Secondary | ICD-10-CM

## 2022-01-23 DIAGNOSIS — J168 Pneumonia due to other specified infectious organisms: Secondary | ICD-10-CM | POA: Diagnosis not present

## 2022-01-23 DIAGNOSIS — I82409 Acute embolism and thrombosis of unspecified deep veins of unspecified lower extremity: Secondary | ICD-10-CM | POA: Diagnosis present

## 2022-01-23 DIAGNOSIS — E785 Hyperlipidemia, unspecified: Secondary | ICD-10-CM | POA: Diagnosis not present

## 2022-01-23 DIAGNOSIS — I2782 Chronic pulmonary embolism: Secondary | ICD-10-CM | POA: Diagnosis not present

## 2022-01-23 DIAGNOSIS — J432 Centrilobular emphysema: Secondary | ICD-10-CM | POA: Diagnosis present

## 2022-01-23 DIAGNOSIS — J189 Pneumonia, unspecified organism: Secondary | ICD-10-CM

## 2022-01-23 DIAGNOSIS — Z880 Allergy status to penicillin: Secondary | ICD-10-CM | POA: Diagnosis not present

## 2022-01-23 DIAGNOSIS — J309 Allergic rhinitis, unspecified: Secondary | ICD-10-CM | POA: Diagnosis present

## 2022-01-23 DIAGNOSIS — Z9842 Cataract extraction status, left eye: Secondary | ICD-10-CM | POA: Diagnosis not present

## 2022-01-23 DIAGNOSIS — Z9841 Cataract extraction status, right eye: Secondary | ICD-10-CM | POA: Diagnosis not present

## 2022-01-23 DIAGNOSIS — K449 Diaphragmatic hernia without obstruction or gangrene: Secondary | ICD-10-CM | POA: Diagnosis not present

## 2022-01-23 DIAGNOSIS — J9611 Chronic respiratory failure with hypoxia: Secondary | ICD-10-CM | POA: Diagnosis not present

## 2022-01-23 DIAGNOSIS — F419 Anxiety disorder, unspecified: Secondary | ICD-10-CM | POA: Diagnosis not present

## 2022-01-23 DIAGNOSIS — J449 Chronic obstructive pulmonary disease, unspecified: Secondary | ICD-10-CM | POA: Diagnosis not present

## 2022-01-23 DIAGNOSIS — Z7982 Long term (current) use of aspirin: Secondary | ICD-10-CM

## 2022-01-23 DIAGNOSIS — R059 Cough, unspecified: Secondary | ICD-10-CM | POA: Diagnosis not present

## 2022-01-23 DIAGNOSIS — R079 Chest pain, unspecified: Secondary | ICD-10-CM | POA: Diagnosis not present

## 2022-01-23 DIAGNOSIS — R918 Other nonspecific abnormal finding of lung field: Secondary | ICD-10-CM | POA: Diagnosis not present

## 2022-01-23 DIAGNOSIS — M4850XA Collapsed vertebra, not elsewhere classified, site unspecified, initial encounter for fracture: Secondary | ICD-10-CM | POA: Diagnosis present

## 2022-01-23 DIAGNOSIS — I712 Thoracic aortic aneurysm, without rupture, unspecified: Secondary | ICD-10-CM | POA: Diagnosis not present

## 2022-01-23 DIAGNOSIS — Z8249 Family history of ischemic heart disease and other diseases of the circulatory system: Secondary | ICD-10-CM | POA: Diagnosis not present

## 2022-01-23 DIAGNOSIS — Z882 Allergy status to sulfonamides status: Secondary | ICD-10-CM

## 2022-01-23 DIAGNOSIS — Z7901 Long term (current) use of anticoagulants: Secondary | ICD-10-CM | POA: Diagnosis not present

## 2022-01-23 DIAGNOSIS — Z79899 Other long term (current) drug therapy: Secondary | ICD-10-CM

## 2022-01-23 DIAGNOSIS — Z20822 Contact with and (suspected) exposure to covid-19: Secondary | ICD-10-CM | POA: Diagnosis not present

## 2022-01-23 DIAGNOSIS — J851 Abscess of lung with pneumonia: Secondary | ICD-10-CM | POA: Diagnosis not present

## 2022-01-23 DIAGNOSIS — Z825 Family history of asthma and other chronic lower respiratory diseases: Secondary | ICD-10-CM

## 2022-01-23 DIAGNOSIS — R1310 Dysphagia, unspecified: Secondary | ICD-10-CM | POA: Diagnosis not present

## 2022-01-23 DIAGNOSIS — Z961 Presence of intraocular lens: Secondary | ICD-10-CM | POA: Diagnosis present

## 2022-01-23 DIAGNOSIS — Z888 Allergy status to other drugs, medicaments and biological substances status: Secondary | ICD-10-CM

## 2022-01-23 DIAGNOSIS — R0602 Shortness of breath: Secondary | ICD-10-CM | POA: Diagnosis not present

## 2022-01-23 DIAGNOSIS — Z87891 Personal history of nicotine dependence: Secondary | ICD-10-CM

## 2022-01-23 DIAGNOSIS — M5430 Sciatica, unspecified side: Secondary | ICD-10-CM | POA: Diagnosis not present

## 2022-01-23 DIAGNOSIS — K219 Gastro-esophageal reflux disease without esophagitis: Secondary | ICD-10-CM | POA: Diagnosis not present

## 2022-01-23 DIAGNOSIS — J984 Other disorders of lung: Secondary | ICD-10-CM

## 2022-01-23 DIAGNOSIS — Z833 Family history of diabetes mellitus: Secondary | ICD-10-CM | POA: Diagnosis not present

## 2022-01-23 DIAGNOSIS — J9809 Other diseases of bronchus, not elsewhere classified: Secondary | ICD-10-CM | POA: Diagnosis not present

## 2022-01-23 DIAGNOSIS — J9621 Acute and chronic respiratory failure with hypoxia: Secondary | ICD-10-CM | POA: Diagnosis present

## 2022-01-23 LAB — CBC WITH DIFFERENTIAL/PLATELET
Abs Immature Granulocytes: 0.06 10*3/uL (ref 0.00–0.07)
Basophils Absolute: 0 10*3/uL (ref 0.0–0.1)
Basophils Relative: 0 %
Eosinophils Absolute: 0 10*3/uL (ref 0.0–0.5)
Eosinophils Relative: 0 %
HCT: 47.2 % — ABNORMAL HIGH (ref 36.0–46.0)
Hemoglobin: 15.3 g/dL — ABNORMAL HIGH (ref 12.0–15.0)
Immature Granulocytes: 1 %
Lymphocytes Relative: 10 %
Lymphs Abs: 1.2 10*3/uL (ref 0.7–4.0)
MCH: 30.5 pg (ref 26.0–34.0)
MCHC: 32.4 g/dL (ref 30.0–36.0)
MCV: 94 fL (ref 80.0–100.0)
Monocytes Absolute: 1.3 10*3/uL — ABNORMAL HIGH (ref 0.1–1.0)
Monocytes Relative: 11 %
Neutro Abs: 9.5 10*3/uL — ABNORMAL HIGH (ref 1.7–7.7)
Neutrophils Relative %: 78 %
Platelets: 362 10*3/uL (ref 150–400)
RBC: 5.02 MIL/uL (ref 3.87–5.11)
RDW: 14.2 % (ref 11.5–15.5)
WBC: 12.1 10*3/uL — ABNORMAL HIGH (ref 4.0–10.5)
nRBC: 0 % (ref 0.0–0.2)

## 2022-01-23 LAB — BASIC METABOLIC PANEL
Anion gap: 8 (ref 5–15)
BUN: 23 mg/dL (ref 8–23)
CO2: 29 mmol/L (ref 22–32)
Calcium: 9.1 mg/dL (ref 8.9–10.3)
Chloride: 103 mmol/L (ref 98–111)
Creatinine, Ser: 1.12 mg/dL — ABNORMAL HIGH (ref 0.44–1.00)
GFR, Estimated: 50 mL/min — ABNORMAL LOW (ref >60)
Glucose, Bld: 106 mg/dL — ABNORMAL HIGH (ref 70–99)
Potassium: 3.9 mmol/L (ref 3.5–5.1)
Sodium: 140 mmol/L (ref 135–145)

## 2022-01-23 LAB — HIV ANTIBODY (ROUTINE TESTING W REFLEX): HIV Screen 4th Generation wRfx: NONREACTIVE

## 2022-01-23 LAB — TROPONIN I (HIGH SENSITIVITY)
Troponin I (High Sensitivity): 8 ng/L (ref <18)
Troponin I (High Sensitivity): 8 ng/L (ref <18)

## 2022-01-23 LAB — RESP PANEL BY RT-PCR (FLU A&B, COVID) ARPGX2
Influenza A by PCR: NEGATIVE
Influenza B by PCR: NEGATIVE
SARS Coronavirus 2 by RT PCR: NEGATIVE

## 2022-01-23 MED ORDER — METHYLPREDNISOLONE SODIUM SUCC 125 MG IJ SOLR
125.0000 mg | Freq: Once | INTRAMUSCULAR | Status: AC
  Administered 2022-01-23: 125 mg via INTRAVENOUS
  Filled 2022-01-23: qty 2

## 2022-01-23 MED ORDER — IPRATROPIUM-ALBUTEROL 0.5-2.5 (3) MG/3ML IN SOLN
3.0000 mL | Freq: Once | RESPIRATORY_TRACT | Status: AC
  Administered 2022-01-23: 3 mL via RESPIRATORY_TRACT
  Filled 2022-01-23: qty 3

## 2022-01-23 MED ORDER — PANTOPRAZOLE SODIUM 40 MG PO TBEC
40.0000 mg | DELAYED_RELEASE_TABLET | Freq: Every day | ORAL | Status: DC
  Administered 2022-01-24 – 2022-01-26 (×3): 40 mg via ORAL
  Filled 2022-01-23 (×3): qty 1

## 2022-01-23 MED ORDER — TRIAMTERENE-HCTZ 37.5-25 MG PO TABS
0.5000 | ORAL_TABLET | Freq: Every day | ORAL | Status: DC
  Administered 2022-01-24 – 2022-01-26 (×3): 0.5 via ORAL
  Filled 2022-01-23 (×3): qty 0.5

## 2022-01-23 MED ORDER — VANCOMYCIN HCL 1500 MG/300ML IV SOLN
1500.0000 mg | Freq: Once | INTRAVENOUS | Status: DC
  Administered 2022-01-23: 1500 mg via INTRAVENOUS
  Filled 2022-01-23: qty 300

## 2022-01-23 MED ORDER — MOMETASONE FURO-FORMOTEROL FUM 200-5 MCG/ACT IN AERO
2.0000 | INHALATION_SPRAY | Freq: Two times a day (BID) | RESPIRATORY_TRACT | Status: DC
  Administered 2022-01-24 – 2022-01-26 (×5): 2 via RESPIRATORY_TRACT
  Filled 2022-01-23: qty 8.8

## 2022-01-23 MED ORDER — ASPIRIN 81 MG PO TBEC
81.0000 mg | DELAYED_RELEASE_TABLET | Freq: Every day | ORAL | Status: DC
  Administered 2022-01-24: 81 mg via ORAL
  Filled 2022-01-23: qty 1

## 2022-01-23 MED ORDER — ALBUTEROL SULFATE 0.63 MG/3ML IN NEBU: 1.0000 | INHALATION_SOLUTION | Freq: Four times a day (QID) | RESPIRATORY_TRACT | Status: DC | PRN

## 2022-01-23 MED ORDER — VANCOMYCIN HCL 1250 MG/250ML IV SOLN: 1250.0000 mg | INTRAVENOUS | Status: DC

## 2022-01-23 MED ORDER — ALBUTEROL SULFATE HFA 108 (90 BASE) MCG/ACT IN AERS: 1.0000 | INHALATION_SPRAY | Freq: Four times a day (QID) | RESPIRATORY_TRACT | Status: DC | PRN

## 2022-01-23 MED ORDER — CLINDAMYCIN HCL 300 MG PO CAPS
300.0000 mg | ORAL_CAPSULE | Freq: Three times a day (TID) | ORAL | Status: DC
Start: 2022-01-23 — End: 2022-01-26
  Administered 2022-01-23 – 2022-01-26 (×9): 300 mg via ORAL
  Filled 2022-01-23 (×2): qty 1
  Filled 2022-01-23: qty 2
  Filled 2022-01-23 (×2): qty 1
  Filled 2022-01-23: qty 2
  Filled 2022-01-23 (×6): qty 1

## 2022-01-23 MED ORDER — IOHEXOL 350 MG/ML SOLN
80.0000 mL | Freq: Once | INTRAVENOUS | Status: AC | PRN
  Administered 2022-01-23: 80 mL via INTRAVENOUS

## 2022-01-23 MED ORDER — IPRATROPIUM-ALBUTEROL 0.5-2.5 (3) MG/3ML IN SOLN: 3.0000 mL | RESPIRATORY_TRACT | Status: DC | PRN

## 2022-01-23 MED ORDER — LORAZEPAM 1 MG PO TABS
1.0000 mg | ORAL_TABLET | Freq: Every day | ORAL | Status: DC
  Administered 2022-01-23 – 2022-01-25 (×3): 1 mg via ORAL
  Filled 2022-01-23 (×3): qty 1

## 2022-01-23 MED ORDER — ACETAMINOPHEN 500 MG PO TABS
500.0000 mg | ORAL_TABLET | Freq: Four times a day (QID) | ORAL | Status: DC | PRN
Start: 2022-01-23 — End: 2022-01-26

## 2022-01-23 MED ORDER — SODIUM CHLORIDE 0.9 % IV SOLN
1.0000 g | Freq: Once | INTRAVENOUS | Status: AC
  Administered 2022-01-23: 1 g via INTRAVENOUS
  Filled 2022-01-23: qty 10

## 2022-01-23 MED ORDER — TRAZODONE HCL 50 MG PO TABS
100.0000 mg | ORAL_TABLET | Freq: Every day | ORAL | Status: DC
  Administered 2022-01-23 – 2022-01-25 (×3): 100 mg via ORAL
  Filled 2022-01-23 (×3): qty 2

## 2022-01-23 MED ORDER — ESCITALOPRAM OXALATE 10 MG PO TABS: 20.0000 mg | ORAL_TABLET | Freq: Every day | ORAL | Status: DC

## 2022-01-23 MED ORDER — THEOPHYLLINE ER 400 MG PO TB24: 400.0000 mg | ORAL_TABLET | Freq: Every day | ORAL | Status: DC

## 2022-01-23 MED ORDER — LORATADINE 10 MG PO TABS: 10.0000 mg | ORAL_TABLET | Freq: Every day | ORAL | Status: DC

## 2022-01-23 MED ORDER — SODIUM CHLORIDE 0.9 % IV SOLN
2.0000 g | Freq: Two times a day (BID) | INTRAVENOUS | Status: DC
  Administered 2022-01-23 – 2022-01-26 (×7): 2 g via INTRAVENOUS
  Filled 2022-01-23 (×7): qty 12.5

## 2022-01-23 NOTE — Consult Note (Signed)
NAME:  MALENI SEYER, MRN:  [id number], DOB:  [birth year], LOS: 0 ADMISSION DATE:  01/23/2022, CONSULTATION DATE:  01/23/2022 REFERRING MD:  Teressa Lower, MD CHIEF COMPLAINT:  coughing blood   History of Present Illness:   Ms. Latarra Eagleton is a 81 yo F with a PMHx of DVT/PE, previously with an IVC filter s/p removal, recently back on Eliquis for L arm clot 11/2021, compliant, COPD and centrilobular emphysema, with intermittent nighlty O2 use, presenting to the [facility] with recurrent cough, subacute hemoptysis, and shortness of breath  Patient was in her usual state of health until June 2023 when she reported a worsening cough requiring her to present to the hospital where she was treated for a COPD exacerbation with Levaquin and a steroid taper. Patient denies resolution of cough or symptomatic improvement, though she was nor producing sputum. Patient was  followed by [name] in 12/08/2021 when he was given a second abx course of Doxycycline.   2 weeks ago, patient presented to UC for worsening cough, where she was prescribed a azithromycin, which she completed. She reports that the cough has not gotten worse, but that is continues to be persistent. It is worse when she eats, as she feels like food gets stuck in the back of her throat and sometimes goes "down the wrong pipe". She has a history of GERD and reports takes PPIs, which usually help with burning sensation.   She first noted streaks of blood on Saturday and it did not worsen until this morning.  Prior to this, she denies yellow or green sputum. Since then, she reports she's been coughing up ~1-2 teaspoons of blood. Patient denies over shortness of breath or needing to increase her O2 use at home. She also endorses R sided chest pain when she lays on her R side, but that there is no pain when she breaths.   Patient does report a 40-pack year smoking history but that she quit ~81 yo ago. She denies vomiting blood. She denies bleeding in her mouth  or nose. She denies ETOH use, abdominal pain, nausea or vomiting. Patient denies fever, chills, weight loss, nightsweats, changes in BM or urination, or bone pain.  Pertinent  Medical History  DVT PE COPD HTN HLD GERD with hx of 3cm hiatal hernia noted on EGD 2021  Significant Hospital Events: Including procedures, antibiotic start and stop dates in addition to other pertinent events   11/16/2021 Chest XR:  No acute infiltrate or effusion 11/17/2021 CT Angio chest PE: no PE, centrilobular emphysema, subsegmental atelectasis on RML and lingula. Hiatal hernia 12/12/2021 CT Angio PE: stable 4.1 cm ascending aortic aneurysm. Stable centrilobular emphysema with minimal subsegmental atelectasis in the RML. No airspace disease or effusion. NO PE.  12/23/21: RLL air space disease, worse in the superior segment. 12/23/21 CT Angio w contrast: New alveolar infiltrate in RLL likely pneumonia. Cavitary lesion with air-fluid level in superior segment of RLL, concerning for abscess. No PE. Ectasia of main pulmonary artery. Stable 4cm ascending thoracic aneurysm.  Interim History / Subjective:  Patient currently feeling okay but worried about the bleeding. The R sided chest pain is bothersome when she lays down but she denies is affecting her breathing.  Objective   Blood pressure 120/87, pulse 84, temperature 98 F (36.7 C), temperature source Oral, resp. rate 18, height _0  (1.676 m), weight 76.2 kg, SpO2 90 %.        Intake/Output Summary (Last 24 hours) at 01/23/2022 1532 Last data filed  at 01/23/2022 1411 Gross per 24 hour  Intake 0 ml  Output --  Net 0 ml   Filed Weights   01/23/22 0817  Weight: 76.2 kg   Examination: Constitutional: well-appearing woman sitting in bed, in no acute distress HENT:mucous membranes moist, no mucosal bleeding Eyes: conjunctiva non-erythematous, non icteric Cardiovascular: regular rate and rhythm, no murmurs Pulmonary/Chest: normal work of breathing on room  air, decreased breath sound throughout R lung, with increased R on R. CTA on L lung fields Abdominal: soft, non-tender, non-distended MSK: normal bulk and tone Neurological: alert & oriented x 3, 5/5 strength in bilateral upper and lower extremities Skin: warm and dry. Well-contained Ecchymoses at venipucture sites. Psych: Pleasant mood and affect   Resolved Hospital Problem list     Assessment & Plan:   RLL alveolar infiltrate RUL Cavitary lesion  Patient with hx of recurrent pneumonia in the setting of multiple courses of antimicrobial therapy, non resolved subacute cough and new onset hemoptysis and a new RUL cavitary lesion with worsening alveolar infiltrates not seen on prior imaging. Patient is currently stable. Given the timeline and progression of symptoms, this is likely in the setting of nonresolved pneumonia from resistant organism to provided antibiotic therapy vs fungal etiology. Patient has chronic emphysematous lung disease with anatomical distortion and a history of GERD with subjective worsening dysphagia associated with cough, suggesting an aspiration pneumonia picture. However, this would not explain the RUL cavitary abscess. Suspect MRSA, anaerobic or fungal etiologies given lack of response to antibiotic therapy. Suspect hemoptysis is in setting of infection, small vessel rupture 2/2 coughing, aggravated by Eliquis use. There is also comment of possible ectasia of R pulmonary artery; however, this is in the distribution of the distorted lung parenchyma and alveolar infiltrate that may be obscuring picture. There is no evidence of this on previous CT scans. Will continue to monitor. Plan: - Continue Vac, cefepime -Add Clindamycin for anaerobic coverage -Bronchoscopy with BAL on 8/29  -Barium esophagram to rule out potential causes of aspiration including dysmotility, stricture, diverticulum. -Hold Eliquis -NPO at midnight  COPD Per primary team Albuterol-ipratropium  duonebs Dulera Theophylline    Best Practice (right click and "Reselect all SmartList Selections" daily)   Diet/type: dysphagia diet (see orders) DVT prophylaxis: SCD GI prophylaxis: PPI Lines: N/A Foley:  N/A Code Status:  full code Last date of multidisciplinary goals of care discussion: none  Labs   CBC: Recent Labs  Lab 01/23/22 1003  WBC 12.1*  NEUTROABS 9.5*  HGB 15.3*  HCT 47.2*  MCV 94.0  PLT 202    Basic Metabolic Panel: Recent Labs  Lab 01/23/22 0826  NA 140  K 3.9  CL 103  CO2 29  GLUCOSE 106*  BUN 23  CREATININE 1.12*  CALCIUM 9.1   GFR: Estimated Creatinine Clearance: 43.2 mL/min (A) (by C-G formula based on SCr of 1.12 mg/dL (H)). Recent Labs  Lab 01/23/22 1003  WBC 12.1*    Liver Function Tests: No results for input(s): "AST", "ALT", "ALKPHOS", "BILITOT", "PROT", "ALBUMIN" in the last 168 hours. No results for input(s): "LIPASE", "AMYLASE" in the last 168 hours. No results for input(s): "AMMONIA" in the last 168 hours.  ABG No results found for: "PHART", "PCO2ART", "PO2ART", "HCO3", "TCO2", "ACIDBASEDEF", "O2SAT"   Coagulation Profile: No results for input(s): "INR", "PROTIME" in the last 168 hours.  Cardiac Enzymes: No results for input(s): "CKTOTAL", "CKMB", "CKMBINDEX", "TROPONINI" in the last 168 hours.  HbA1C: Hgb A1c MFr Bld  Date/Time Value Ref Range  Status  08/27/2019 05:07 PM 4.9 4.8 - 5.6 % Final    Comment:    (NOTE) Pre diabetes:          5.7%-6.4% Diabetes:              >6.4% Glycemic control for   <7.0% adults with diabetes   05/28/2013 05:45 AM 5.8 (H) <5.7 % Final    Comment:    (NOTE)                                                                       According to the ADA Clinical Practice Recommendations for 2011, when HbA1c is used as a screening test:  >=6.5%   Diagnostic of Diabetes Mellitus           (if abnormal result is confirmed) 5.7-6.4%   Increased risk of developing Diabetes  [place] References:Diagnosis and Classification of Diabetes Mellitus,Diabetes FMBW,4665,99(JTTSV 1):S62-S69 and Standards of Medical Care in         Diabetes - 2011,Diabetes XBLT,9030,09 (Suppl 1):S11-S61.    CBG: No results for input(s): "GLUCAP" in the last 168 hours.  Review of Systems:   Other than pertinent per HPI  Past Medical History:  She,  has a past medical history of Allergic rhinitis, Anemia due to GI blood loss, Anxiety, Asthma, Chronic airway obstruction, not elsewhere classified, Depressive disorder, not elsewhere classified, DVT (deep venous thrombosis) ([place]), Dyspnea, Esophageal reflux, Essential hypertension (08/27/2019), PE (pulmonary thromboembolism) ([place]) (01/29/2019), Pneumonia, and Wears partial dentures.   Surgical History:   Past Surgical History:  Procedure Laterality Date   ABDOMINAL HYSTERECTOMY     ABDOMINAL HYSTERECTOMY     CATARACT EXTRACTION W/ INTRAOCULAR LENS  IMPLANT, BILATERAL     CHOLECYSTECTOMY N/A 08/12/2021   Procedure: LAPAROSCOPIC CHOLECYSTECTOMY;  Surgeon: Felicie Morn, MD;  Location: [facility];  Service: General;  Laterality: N/A;   COLONOSCOPY     COLONOSCOPY WITH PROPOFOL N/A 08/20/2019   Procedure: COLONOSCOPY WITH PROPOFOL;  Surgeon: Ronald Lobo, MD;  Location: [place];  Service: Endoscopy;  Laterality: N/A;   CYSTOSCOPY     DILATION AND CURETTAGE OF UTERUS     ECTROPION REPAIR Bilateral 05/12/2021   Procedure: REPAIR OF ECTROPION BY LATERAL TARSAL STRIP OF BILATERAL LOWER EYE LIDS;  Surgeon: Delia Chimes, MD;  Location: [place];  Service: Ophthalmology;  Laterality: Bilateral;   ESOPHAGOGASTRODUODENOSCOPY (EGD) WITH PROPOFOL N/A 08/20/2019   Procedure: ESOPHAGOGASTRODUODENOSCOPY (EGD) WITH PROPOFOL;  Surgeon: Ronald Lobo, MD;  Location: [place];  Service: Endoscopy;  Laterality: N/A;   HEMOSTASIS CONTROL  08/20/2019   Procedure: HEMOSTASIS CONTROL;  Surgeon: Ronald Lobo, MD;  Location: [facility];   Service: Endoscopy;;   INTRAVASCULAR ULTRASOUND/IVUS Left 05/26/2019   Procedure: Intravascular Ultrasound/IVUS;  Surgeon: Waynetta Sandy, MD;  Location: [place] [facility];  Service: Cardiovascular;  Laterality: Left;   IVC FILTER REMOVAL N/A 03/24/2019   Procedure: IVC FILTER REMOVAL;  Surgeon: Waynetta Sandy, MD;  Location: [facility];  Service: Cardiovascular;  Laterality: N/A;   LACRIMAL TUBE INSERTION Right 05/12/2021   Procedure: RIGHT NASOLACRIMAL DUCT PROBE WITH MINI MONOKA STENT PLACEMENT;  Surgeon: Delia Chimes, MD;  Location: [place];  Service: Ophthalmology;  Laterality: Right;   LESION EXCISION WITH  COMPLEX REPAIR N/A 05/12/2021   Procedure: NASAL LESION EXCISION;  Surgeon: Delia Chimes, MD;  Location: [place];  Service: Ophthalmology;  Laterality: N/A;   LOWER EXTREMITY VENOGRAPHY Left 05/26/2019   Procedure: LOWER EXTREMITY VENOGRAPHY;  Surgeon: Waynetta Sandy, MD;  Location: [place] [facility];  Service: Cardiovascular;  Laterality: Left;   MULTIPLE TOOTH EXTRACTIONS     NASAL SINUS SURGERY     PERIPHERAL VASCULAR INTERVENTION Left 05/26/2019   Procedure: PERIPHERAL VASCULAR INTERVENTION;  Surgeon: Waynetta Sandy, MD;  Location: [place] [facility];  Service: Cardiovascular;  Laterality: Left;  lower extremity veins   PERIPHERAL VASCULAR THROMBECTOMY  05/26/2019   Procedure: PERIPHERAL VASCULAR THROMBECTOMY;  Surgeon: Waynetta Sandy, MD;  Location: [place] [facility];  Service: Cardiovascular;;   PTOSIS REPAIR Bilateral 05/12/2021   Procedure: EXTERNAL PTOSIS REPAIR LEVATOR ADVANCEDMENT/RESECTION OF BILATERAL UPPER EYE LIDS;  Surgeon: Delia Chimes, MD;  Location: [place];  Service: Ophthalmology;  Laterality: Bilateral;   TUBAL LIGATION     VENA CAVA FILTER PLACEMENT N/A 01/13/2019   Procedure: INSERTION VENA-CAVA FILTER;  Surgeon: Waynetta Sandy, MD;  Location: [place];  Service: Vascular;  Laterality:  N/A;     Social History:   reports that she quit smoking about 23 years ago. Her smoking use included cigarettes. She has a 10.00 pack-year smoking history. She has never used smokeless tobacco. She reports that she does not drink alcohol and does not use drugs.   Family History:  Her family history includes Coronary artery disease in her brother and mother; Diabetes in her mother; Emphysema in her father.   Allergies Allergies  Allergen Reactions   Pneumococcal Vaccines Shortness Of Breath and Other (See Comments)    Couldn't breathe    Clarithromycin Swelling   Estradiol Rash    Reaction to cream   Sulfa Antibiotics Nausea And Vomiting and Swelling   Doxycycline Hyclate     Other reaction(s): Other (See Comments), stomach distress   Penicillins Swelling    Tongue swelling Did it involve swelling of the face/tongue/throat, SOB, or low BP? Yes Did it involve sudden or severe rash/hives, skin peeling, or any reaction on the inside of your mouth or nose? Yes Did you need to seek medical attention at a hospital or doctor's office? Yes When did it last happen?  childhood If all above answers are "NO", may proceed with cephalosporin use.    Pneumococcal Polysaccharide Vaccine     Other reaction(s): Unknown   Sulfacetamide Sodium-Sulfur Other (See Comments)   Sulfonamide Derivatives Swelling     Home Medications  Prior to Admission medications   Medication Sig Start Date End Date Taking? Authorizing Provider  acetaminophen (TYLENOL) 500 MG tablet Take 500 mg by mouth every 6 (six) hours as needed for headache (pain).    [provider]  albuterol (ACCUNEB) 0.63 MG/3ML nebulizer solution Take 1 ampule by nebulization every 6 (six) hours as needed for shortness of breath or wheezing. 11/14/21   [provider]  albuterol (VENTOLIN HFA) 108 (90 Base) MCG/ACT inhaler Inhale 1-2 puffs into the lungs every 6 (six) hours as needed for wheezing or shortness of breath.     [provider]  aspirin EC 81 MG tablet Take 81 mg by mouth daily. Swallow whole.    [provider]  cetirizine (ZYRTEC) 10 MG tablet Take 10 mg by mouth daily as needed for allergies.    [provider]  doxycycline (VIBRA-TABS) 100 MG tablet Take 1  tablet (100 mg total) by mouth 2 (two) times daily. 12/08/21   Young, Kasandra Knudsen, MD  ELIQUIS 5 MG TABS tablet Take 5 mg by mouth 2 (two) times daily. 12/01/21   [provider]  escitalopram (LEXAPRO) 20 MG tablet Take 20 mg by mouth daily.    [provider]  fluticasone-salmeterol (ADVAIR) 250-50 MCG/ACT AEPB Inhale 1 puff then rinse mouth, twice daily Patient taking differently: Inhale 1 puff into the lungs in the morning and at bedtime. Inhale 1 puff then rinse mouth, twice daily 09/29/20   Baird Lyons D, MD  levofloxacin (LEVAQUIN) 750 MG tablet Take 1 tablet (750 mg total) by mouth daily. 12/27/21   Deneise Lever, MD  LORazepam (ATIVAN) 1 MG tablet Take 1 mg by mouth at bedtime.    [provider]  omeprazole (PRILOSEC) 20 MG capsule Take 20 mg by mouth daily. 03/26/20   [provider]  theophylline (THEO-24) 400 MG 24 hr capsule Take 1 capsule (400 mg total) by mouth daily. 11/24/21   Baird Lyons D, MD  theophylline (UNIPHYL) 400 MG 24 hr tablet Take 400 mg by mouth daily. 12/23/21   [provider]  traZODone (DESYREL) 100 MG tablet Take 100 mg by mouth at bedtime.    [provider]  triamterene-hydrochlorothiazide (MAXZIDE-25) 37.5-25 MG tablet Take 0.5 tablets by mouth daily. 02/06/19   [provider]

## 2022-01-23 NOTE — Consult Note (Signed)
Attending:   I have seen and examined the patient independently and discussed the assessment and plan with Dr. Glendon Axe.  Subjective: Ms. Sprung presented here due to right chest pain and hemoptysis that has been ongoing for 3 days and started after 2-3 weeks of cough and mucus production.  She has COPD and has been treated for pneumonia 2 times in the last 2 months, the most recent was a course of doxycycline prescribed by urgent care 2 weeks prior to admission for right lung pneumonia.  Breathlessness has been worsening, she was recently started on theophylline by [name] her pulmonary physician.  She denies fever or chills.  Hemoptysis worsened this morning and was about 3-4 tablespoons so she came here.  CT noted a new cavitary lesion in the superior segment of her RUL.    She notes some difficulty swallowing in that food will get stuck in her throat/chest from time to time.   Past Medical History:  Diagnosis Date   Allergic rhinitis    Anemia due to GI blood loss    Anxiety    Asthma    Chronic airway obstruction, not elsewhere classified    Depressive disorder, not elsewhere classified    DVT (deep venous thrombosis) (HCC)    LLE DVT 01/13/19   Dyspnea    Esophageal reflux    Essential hypertension 08/27/2019   PE (pulmonary thromboembolism) ([place]) 01/29/2019   Pneumonia    Wears partial dentures      Objective: Vitals:   01/23/22 1200 01/23/22 1221 01/23/22 1344 01/23/22 1415  BP: (!) 130/103  127/84 120/87  Pulse: 87 85 87 84  Resp: _0 Temp:  98 F (36.7 C)    TempSrc:  Oral    SpO2: 96% 94% 92% 90%  Weight:      Height:          Intake/Output Summary (Last 24 hours) at 01/23/2022 1427 Last data filed at 01/23/2022 1411 Gross per 24 hour  Intake 0 ml  Output --  Net 0 ml    General:  Chronically ill appearing, resting comfortably in bed HENT: NCAT OP clear PULM: CTA B, normal effort CV: RRR, no mgr GI: BS+, soft, nontender MSK: normal  bulk and tone Neuro: awake, alert, no distress, MAEW    CBC    Component Value Date/Time   WBC 12.1 (H) 01/23/2022 1003   RBC 5.02 01/23/2022 1003   HGB 15.3 (H) 01/23/2022 1003   HCT 47.2 (H) 01/23/2022 1003   PLT 362 01/23/2022 1003   MCV 94.0 01/23/2022 1003   MCH 30.5 01/23/2022 1003   MCHC 32.4 01/23/2022 1003   RDW 14.2 01/23/2022 1003   LYMPHSABS 1.2 01/23/2022 1003   MONOABS 1.3 (H) 01/23/2022 1003   EOSABS 0.0 01/23/2022 1003   BASOSABS 0.0 01/23/2022 1003    BMET    Component Value Date/Time   NA 140 01/23/2022 0826   K 3.9 01/23/2022 0826   CL 103 01/23/2022 0826   CO2 29 01/23/2022 0826   GLUCOSE 106 (H) 01/23/2022 0826   BUN 23 01/23/2022 0826   CREATININE 1.12 (H) 01/23/2022 0826   CREATININE 0.80 07/28/2019 1014   CALCIUM 9.1 01/23/2022 0826   GFRNONAA 50 (L) 01/23/2022 0826   GFRNONAA >60 07/28/2019 1014   GFRAA >60 11/22/2019 0603   GFRAA >60 07/28/2019 1014    CT chest personally reviewed> no PE, new thin walled cavity with dependent fluid layering and surrounding consolidation/patchy air space  disease; emphysema; the cavity is new compared to the July 2023 CT chest  Impression/Plan: Recurrent pneumonia in the RUL with hemoptysis and a cavitary lesion> given the rapid growth suspect infectious: MRSA and anaerobes come to mind.  Agree with vanc, cefepime, will add clinda for anaerobic coverage.  Will plan for bronchoscopy on 8/29 for BAL to help guide therapy and inspect airways.   Dysphagia> could she have esophageal pathology causing recurrent pneumonia? Barium swallow ordered  My cc time n/a minutes  Roselie Awkward, MD [place] [facility] Pager: [phone] Cell: [phone] After 7pm: [phone]

## 2022-01-23 NOTE — Progress Notes (Signed)
Pharmacy Antibiotic Note  [name] is a 81 y.o. female admitted on 01/23/2022 with COPD and questionable PNA. Patient's O2 sat was 87% while in waiting room. Renal function has been stable, Scr: 1.12 baseline 1.06-1.07. No notable historical microbiology data. Pharmacy has been consulted for cefepime and vancomycin dosing  Plan: Cefepime 2g Q12H  Vancomycin '1500mg'$  ('20mg'$ /kg) 1x load, then '1250mg'$  Q24H. eAUC: 500.  Ordered MRSA PCR.    Height: '5\' 6"'$  (167.6 cm) Weight: 76.2 kg (168 lb) IBW/kg (Calculated) : 59.3  Temp (24hrs), Avg:98.3 F (36.8 C), Min:98 F (36.7 C), Max:98.5 F (36.9 C)  Recent Labs  Lab 01/23/22 0826 01/23/22 1003  WBC  --  12.1*  CREATININE 1.12*  --     Estimated Creatinine Clearance: 43.2 mL/min (A) (by C-G formula based on SCr of 1.12 mg/dL (H)).    Allergies  Allergen Reactions   Pneumococcal Vaccines Shortness Of Breath and Other (See Comments)    Couldn't breathe    Clarithromycin Swelling   Estradiol Rash    Reaction to cream   Sulfa Antibiotics Nausea And Vomiting and Swelling   Doxycycline Hyclate     Other reaction(s): Other (See Comments), stomach distress   Penicillins Swelling    Tongue swelling Did it involve swelling of the face/tongue/throat, SOB, or low BP? Yes Did it involve sudden or severe rash/hives, skin peeling, or any reaction on the inside of your mouth or nose? Yes Did you need to seek medical attention at a hospital or doctor's office? Yes When did it last happen?  childhood If all above answers are "NO", may proceed with cephalosporin use.    Pneumococcal Polysaccharide Vaccine     Other reaction(s): Unknown   Sulfonamide Derivatives Swelling    Thank you for allowing pharmacy to be a part of this patient's care.  Ventura Sellers 01/23/2022 1:27 PM

## 2022-01-23 NOTE — ED Provider Notes (Signed)
[facility] EMERGENCY DEPARTMENT Provider Note  CSN: [id number] Arrival date & time: 01/23/22 0932  Chief Complaint(s) Shortness of Breath and Hemoptysis  HPI [name] is a 81 y.o. female with PMH DVT/PE previously with a IVC filter that has since been removed and patient placed back on Eliquis with appropriate compliance, last dose this morning, COPD with as needed O2 use at night who presents emergency department for evaluation of cough and hypoxia.  Patient states that 2 weeks ago she was seen in urgent care and prescribed doxycycline for pneumonia which she completed the course but her symptoms have not improved.  She states that she is starting to feel right-sided chest pain with associated hemoptysis.  Endorses exertional shortness of breath and is 87% on room air at triage.  Saturating 93% on 2 L on my evaluation and patient did have active hemoptysis on my evaluation.  Denies abdominal pain, nausea, vomiting, fever, headache or other systemic symptoms.   Past Medical History Past Medical History:  Diagnosis Date   Allergic rhinitis    Anemia due to GI blood loss    Anxiety    Asthma    Chronic airway obstruction, not elsewhere classified    Depressive disorder, not elsewhere classified    DVT (deep venous thrombosis) ([place])    LLE DVT 01/13/19   Dyspnea    Esophageal reflux    Essential hypertension 08/27/2019   PE (pulmonary thromboembolism) ([place]) 01/29/2019   Pneumonia    Wears partial dentures    Patient Active Problem List   Diagnosis Date Noted   Anxiety and depression 11/16/2021   Nocturnal hypoxemia 05/26/2021   Acute respiratory failure due to COVID-19 [facility]) 06/11/2020   Dyslipidemia 06/11/2020   Acute lower GI bleeding 08/27/2019   History of pulmonary embolism 08/27/2019   Hyperglycemia 08/27/2019   Essential hypertension 08/27/2019   DVT, recurrent, lower extremity, chronic, left 01/28/2019   Acute pulmonary embolism with acute cor  pulmonale ([place]) 01/28/2019   Hypokalemia 01/28/2019   Retroperitoneal bleed 01/13/2019   CAP (community acquired pneumonia) 05/27/2013   SOB (shortness of breath) 05/27/2013   Myalgia 05/27/2013   COPD with acute exacerbation ([place]) 05/27/2013   Chronic respiratory failure with hypoxia ([place]) 05/27/2013   Vertebral compression fracture ([place]) 09/15/2010   SCIATICA 12/16/2007   COPD mixed type ([place]) 07/17/2007   ALLERGIC RHINITIS 07/16/2007   BRONCHITIS 07/16/2007   ASTHMA 07/16/2007   Esophageal reflux 07/16/2007   Home Medication(s) Prior to Admission medications   Medication Sig Start Date End Date Taking? Authorizing Provider  acetaminophen (TYLENOL) 500 MG tablet Take 500 mg by mouth every 6 (six) hours as needed for headache (pain).    [provider]  albuterol (ACCUNEB) 0.63 MG/3ML nebulizer solution Take 1 ampule by nebulization every 6 (six) hours as needed for shortness of breath or wheezing. 11/14/21   [provider]  albuterol (VENTOLIN HFA) 108 (90 Base) MCG/ACT inhaler Inhale 1-2 puffs into the lungs every 6 (six) hours as needed for wheezing or shortness of breath.    [provider]  aspirin EC 81 MG tablet Take 81 mg by mouth daily. Swallow whole.    [provider]  cetirizine (ZYRTEC) 10 MG tablet Take 10 mg by mouth daily as needed for allergies.    [provider]  doxycycline (VIBRA-TABS) 100 MG tablet Take 1 tablet (100 mg total) by mouth 2 (two) times daily. 12/08/21   Deneise Lever, MD  escitalopram (LEXAPRO) 20  MG tablet Take 20 mg by mouth daily.    [provider]  fluticasone-salmeterol (ADVAIR) 250-50 MCG/ACT AEPB Inhale 1 puff then rinse mouth, twice daily Patient taking differently: Inhale 1 puff into the lungs in the morning and at bedtime. Inhale 1 puff then rinse mouth, twice daily 09/29/20   Baird Lyons D, MD  levofloxacin (LEVAQUIN) 750 MG tablet Take 1 tablet (750 mg total) by mouth daily. 12/27/21    Deneise Lever, MD  LORazepam (ATIVAN) 1 MG tablet Take 1 mg by mouth at bedtime.    [provider]  omeprazole (PRILOSEC) 20 MG capsule Take 20 mg by mouth daily. 03/26/20   [provider]  theophylline (THEO-24) 400 MG 24 hr capsule Take 1 capsule (400 mg total) by mouth daily. 11/24/21   Deneise Lever, MD  traZODone (DESYREL) 100 MG tablet Take 100 mg by mouth at bedtime.    [provider]  triamterene-hydrochlorothiazide (MAXZIDE-25) 37.5-25 MG tablet Take 0.5 tablets by mouth daily. 02/06/19   [provider]                                                                                                                                    Past Surgical History Past Surgical History:  Procedure Laterality Date   ABDOMINAL HYSTERECTOMY     ABDOMINAL HYSTERECTOMY     CATARACT EXTRACTION W/ INTRAOCULAR LENS  IMPLANT, BILATERAL     CHOLECYSTECTOMY N/A 08/12/2021   Procedure: LAPAROSCOPIC CHOLECYSTECTOMY;  Surgeon: Felicie Morn, MD;  Location: [facility];  Service: General;  Laterality: N/A;   COLONOSCOPY     COLONOSCOPY WITH PROPOFOL N/A 08/20/2019   Procedure: COLONOSCOPY WITH PROPOFOL;  Surgeon: Ronald Lobo, MD;  Location: [place];  Service: Endoscopy;  Laterality: N/A;   CYSTOSCOPY     DILATION AND CURETTAGE OF UTERUS     ECTROPION REPAIR Bilateral 05/12/2021   Procedure: REPAIR OF ECTROPION BY LATERAL TARSAL STRIP OF BILATERAL LOWER EYE LIDS;  Surgeon: Delia Chimes, MD;  Location: [place];  Service: Ophthalmology;  Laterality: Bilateral;   ESOPHAGOGASTRODUODENOSCOPY (EGD) WITH PROPOFOL N/A 08/20/2019   Procedure: ESOPHAGOGASTRODUODENOSCOPY (EGD) WITH PROPOFOL;  Surgeon: Ronald Lobo, MD;  Location: [place];  Service: Endoscopy;  Laterality: N/A;   HEMOSTASIS CONTROL  08/20/2019   Procedure: HEMOSTASIS CONTROL;  Surgeon: Ronald Lobo, MD;  Location: [facility] END[facility]COPY;  Service: Endoscopy;;   INTRAVASCULAR ULTRASOUND/IVUS Left  05/26/2019   Procedure: Intravascular Ultrasound/IVUS;  Surgeon: Waynetta Sandy, MD;  Location: [place] CV LAB;  Service: Cardiovascular;  Laterality: Left;   IVC FILTER REMOVAL N/A 03/24/2019   Procedure: IVC FILTER REMOVAL;  Surgeon: Waynetta Sandy, MD;  Location: [place] CV LAB;  Service: Cardiovascular;  Laterality: N/A;   LACRIMAL TUBE INSERTION Right 05/12/2021   Procedure: RIGHT NASOLACRIMAL DUCT PROBE WITH MINI MONOKA STENT PLACEMENT;  Surgeon: Delia Chimes, MD;  Location: [place];  Service: Ophthalmology;  Laterality:  Right;   LESION EXCISION WITH COMPLEX REPAIR N/A 05/12/2021   Procedure: NASAL LESION EXCISION;  Surgeon: Delia Chimes, MD;  Location: [place];  Service: Ophthalmology;  Laterality: N/A;   LOWER EXTREMITY VENOGRAPHY Left 05/26/2019   Procedure: LOWER EXTREMITY VENOGRAPHY;  Surgeon: Waynetta Sandy, MD;  Location: [place] CV LAB;  Service: Cardiovascular;  Laterality: Left;   MULTIPLE TOOTH EXTRACTIONS     NASAL SINUS SURGERY     PERIPHERAL VASCULAR INTERVENTION Left 05/26/2019   Procedure: PERIPHERAL VASCULAR INTERVENTION;  Surgeon: Waynetta Sandy, MD;  Location: [place] CV LAB;  Service: Cardiovascular;  Laterality: Left;  lower extremity veins   PERIPHERAL VASCULAR THROMBECTOMY  05/26/2019   Procedure: PERIPHERAL VASCULAR THROMBECTOMY;  Surgeon: Waynetta Sandy, MD;  Location: [place] CV LAB;  Service: Cardiovascular;;   PTOSIS REPAIR Bilateral 05/12/2021   Procedure: EXTERNAL PTOSIS REPAIR LEVATOR ADVANCEDMENT/RESECTION OF BILATERAL UPPER EYE LIDS;  Surgeon: Delia Chimes, MD;  Location: [place];  Service: Ophthalmology;  Laterality: Bilateral;   TUBAL LIGATION     VENA CAVA FILTER PLACEMENT N/A 01/13/2019   Procedure: INSERTION VENA-CAVA FILTER;  Surgeon: Waynetta Sandy, MD;  Location: [place];  Service: Vascular;  Laterality: N/A;   Family History Family History  Problem Relation  Age of Onset   Diabetes Mother    Coronary artery disease Mother    Emphysema Father    Coronary artery disease Brother     Social History Social History   Tobacco Use   Smoking status: Former    Packs/day: 0.25    Years: 40.00    Total pack years: 10.00    Types: Cigarettes    Quit date: 2000    Years since quitting: 23.6   Smokeless tobacco: Never  Vaping Use   Vaping Use: Never used  Substance Use Topics   Alcohol use: No   Drug use: No   Allergies Pneumococcal vaccines, Clarithromycin, Estradiol, Sulfa antibiotics, Doxycycline hyclate, Penicillins, Pneumococcal polysaccharide vaccine, and Sulfonamide derivatives  Review of Systems Review of Systems  Respiratory:  Positive for cough and shortness of breath.        Hemoptysis  Cardiovascular:  Positive for chest pain.    Physical Exam Vital Signs  I have reviewed the triage vital signs BP (!) 115/90   Pulse 95   Temp 98.5 F (36.9 C) (Oral)   Resp 15   Ht '5\' 6"'$  (1.676 m)   Wt 76.2 kg   SpO2 91%   BMI 27.12 kg/m   Physical Exam Vitals and nursing note reviewed.  Constitutional:      General: She is not in acute distress.    Appearance: She is well-developed.  HENT:     Head: Normocephalic and atraumatic.  Eyes:     Conjunctiva/sclera: Conjunctivae normal.  Cardiovascular:     Rate and Rhythm: Normal rate and regular rhythm.     Heart sounds: No murmur heard. Pulmonary:     Effort: Pulmonary effort is normal. No respiratory distress.     Breath sounds: Wheezing present.  Abdominal:     Palpations: Abdomen is soft.     Tenderness: There is no abdominal tenderness.  Musculoskeletal:        General: No swelling.     Cervical back: Neck supple.  Skin:    General: Skin is warm and dry.     Capillary Refill: Capillary refill takes less than 2 seconds.  Neurological:     Mental Status: She is alert.  Psychiatric:  Mood and Affect: Mood normal.     ED Results and Treatments Labs (all  labs ordered are listed, but only abnormal results are displayed) Labs Reviewed  BASIC METABOLIC PANEL - Abnormal; Notable for the following components:      Result Value   Glucose, Bld 106 (*)    Creatinine, Ser 1.12 (*)    GFR, Estimated 50 (*)    All other components within normal limits  CBC WITH DIFFERENTIAL/PLATELET - Abnormal; Notable for the following components:   WBC 12.1 (*)    Hemoglobin 15.3 (*)    HCT 47.2 (*)    Neutro Abs 9.5 (*)    Monocytes Absolute 1.3 (*)    All other components within normal limits  CBC WITH DIFFERENTIAL/PLATELET  TROPONIN I (HIGH SENSITIVITY)  TROPONIN I (HIGH SENSITIVITY)                                                                                                                          Radiology DG Chest 2 View  Result Date: 01/23/2022 CLINICAL DATA:  Shortness of breath.  Recent diagnosis of pneumonia. EXAM: CHEST - 2 VIEW COMPARISON:  CT 12/12/2021 FINDINGS: Normal heart size. No pleural effusion identified. There is diffuse airspace disease throughout the right lower lobe, most severe within the superior segment. Left lung appears clear. Visualized osseous structures are unremarkable. IMPRESSION: 1. New right lower lobe airspace disease compatible with pneumonia. Followup PA and lateral chest X-ray is recommended in 3-4 weeks following trial of antibiotic therapy to ensure resolution and exclude underlying malignancy. Electronically Signed   By: Kerby Moors M.D.   On: 01/23/2022 08:50    Pertinent labs & imaging results that were available during my care of the patient were reviewed by me and considered in my medical decision making (see MDM for details).  Medications Ordered in ED Medications - No data to display                                                                                                                                   Procedures .Critical Care  Performed by: Teressa Lower, MD Authorized by: Teressa Lower,  MD   Critical care provider statement:    Critical care time (minutes):  30   Critical care was necessary to treat or prevent imminent or life-threatening deterioration of the following conditions:  Respiratory failure   Critical care was  time spent personally by me on the following activities:  Development of treatment plan with patient or surrogate, discussions with consultants, evaluation of patient's response to treatment, examination of patient, ordering and review of laboratory studies, ordering and review of radiographic studies, ordering and performing treatments and interventions, pulse oximetry, re-evaluation of patient's condition and review of old charts   (including critical care time)  Medical Decision Making / ED Course   This patient presents to the ED for concern of cough, shortness of breath, hemoptysis, this involves an extensive number of treatment options, and is a complaint that carries with it a high risk of complications and morbidity.  The differential diagnosis includes pneumonia, pulmonary abscess, pulmonary AVM, PE  MDM: Patient seen in the emergency room for evaluation of cough and hemoptysis.  Physical exam reveals wheezing bilaterally but significant worse on the right side.  Laboratory evaluation with a leukocytosis to 12.1, hemoglobin stable at 15.3, creatinine 1.12, troponin is normal.  Chest x-ray with concern for pneumonia on the right.  CT PE shows a new large alveolar infiltrate in the right lower lobe with sizable cavitary lesion and possible pulmonary abscess.  Patient given steroids and DuoNebs for the wheezing which did improve her breathing.  Initially patient was given ceftriaxone after x-ray findings but antibiotics expanded after CT findings.  I consulted pulmonology and spoke with Dr. Lake Bells who evaluated the patient at bedside and states that she would likely need a bronchoscopy and swallow study tomorrow but does not need drain placement or any other  significant interventions outside of antibiotics.  Patient then admitted to the hospital service.   Additional history obtained: -Additional history obtained from daughter -External records from outside source obtained and reviewed including: Chart review including previous notes, labs, imaging, consultation notes   Lab Tests: -I ordered, reviewed, and interpreted labs.   The pertinent results include:   Labs Reviewed  BASIC METABOLIC PANEL - Abnormal; Notable for the following components:      Result Value   Glucose, Bld 106 (*)    Creatinine, Ser 1.12 (*)    GFR, Estimated 50 (*)    All other components within normal limits  CBC WITH DIFFERENTIAL/PLATELET - Abnormal; Notable for the following components:   WBC 12.1 (*)    Hemoglobin 15.3 (*)    HCT 47.2 (*)    Neutro Abs 9.5 (*)    Monocytes Absolute 1.3 (*)    All other components within normal limits  CBC WITH DIFFERENTIAL/PLATELET  TROPONIN I (HIGH SENSITIVITY)  TROPONIN I (HIGH SENSITIVITY)      EKG   EKG Interpretation  Date/Time:  Monday January 23 2022 08:26:48 EDT Ventricular Rate:  97 PR Interval:  150 QRS Duration: 68 QT Interval:  370 QTC Calculation: 469 R Axis:   65 Text Interpretation: Normal sinus rhythm No previous ECGs available Confirmed by [place] ([id number]) on 01/23/2022 11:45:25 AM         Imaging Studies ordered: I ordered imaging studies including chest x-ray, CT PE I independently visualized and interpreted imaging. I agree with the radiologist interpretation   Medicines ordered and prescription drug management: No orders of the defined types were placed in this encounter.   -I have reviewed the patients home medicines and have made adjustments as needed  Critical interventions Oxygen supplementation, antibiotics, pulmonary consultation  Consultations Obtained: I requested consultation with the pulmonologist Dr. Lake Bells,  and discussed lab and imaging findings as well as  pertinent plan - they recommend: Hospitalist  admission and likely bronchoscopy tomorrow   Cardiac Monitoring: The patient was maintained on a cardiac monitor.  I personally viewed and interpreted the cardiac monitored which showed an underlying rhythm of: NSR  Social Determinants of Health:  Factors impacting patients care include: none   Reevaluation: After the interventions noted above, I reevaluated the patient and found that they have :improved  Co morbidities that complicate the patient evaluation  Past Medical History:  Diagnosis Date   Allergic rhinitis    Anemia due to GI blood loss    Anxiety    Asthma    Chronic airway obstruction, not elsewhere classified    Depressive disorder, not elsewhere classified    DVT (deep venous thrombosis) ([facility])    LLE DVT 01/13/19   Dyspnea    Esophageal reflux    Essential hypertension 08/27/2019   PE (pulmonary thromboembolism) ([place]) 01/29/2019   Pneumonia    Wears partial dentures       Dispostion: I considered admission for this patient, and due to new oxygen requirement with a possible pulmonary abscess and failure of outpatient antibiotics, patient require hospital admission     Final Clinical Impression(s) / ED Diagnoses Final diagnoses:  None     '@PCDICTATION'$ @    Teressa Lower, MD 01/23/22 1346

## 2022-01-23 NOTE — ED Notes (Signed)
Admitting at bedside 

## 2022-01-23 NOTE — ED Triage Notes (Signed)
Hx of COPD went to UC 2 weeks ago told she has pna and was prescribed antbiotics but now has worsening COPD and coughing up blood.  Patient is on oxygen PRN at home and sats in triage 87%

## 2022-01-23 NOTE — H&P (Signed)
History and Physical    DOA: 01/23/2022  PCP: [name], [name]  Patient coming from: Home  Chief Complaint: Worsening cough/hemoptysis  HPI: [name] is a 81 y.o. female with history h/o asthma/COPD who uses O2 only as needed-mostly at night, history of DVT /PE s/p IVC filter removal and now on Eliquis, GERD, chronic anemia, presents with failure of OP rx for RLL PNA (was on doxy/Levaquin) , persistent cough and now hemoptysis.  Patient states she presented to urgent care with worsening dyspnea few weeks back for which she was prescribed doxycycline and then Levaquin-she reports completing antibiotics past Saturday.  She however did not feel any improvement in terms of cough or dyspnea.  Furthermore she started developing bloody phlegm which prompted this ED visit.  She states cough had mostly been dry and at times with clear phlegm initially.  ED course: Initially on arrival, noted to be hypoxic at 87% and placed on 2 L O2.  Normotensive, afebrile, respiratory rate 13-20.  CXR with persistent RLL PNA.  Patient initially got IV Solu-Medrol and IV ceftriaxone in ED. CT Chest PE protocol revealed "new large alveolar infiltrate in right lower lobe suggesting pneumonia. There is a large cavitary lesion with air-fluid level in the superior segment of right lower lobe suggesting possible lung abscess". IV cefepime/Vanc initiated in ED. Seen by PCCM -recommended medical admission and plan for bronch in a.m. Patient mentating well, somewhat dyspneic while talking full sentences, daughter-in-law at bedside during my evaluation.  Daughter-in-law reports that patient can usually walk about a block without significant dyspnea.  Lately she has been dyspneic on taking a flight of stairs or walking more than 500 feet.  Her O2 sat apparently picks up on walking usually.  She does have a pulse oximeter at home and her O2 sat usually runs between 88 to 92% at rest.  Patient does not necessarily use home O2  based on sats but reports using whenever she feels uncomfortable and mostly at night.  She does not have any formally diagnosed history of sleep apnea.   Review of Systems: As per HPI, otherwise review of systems negative.    Past Medical History:  Diagnosis Date   Allergic rhinitis    Anemia due to GI blood loss    Anxiety    Asthma    Chronic airway obstruction, not elsewhere classified    Depressive disorder, not elsewhere classified    DVT (deep venous thrombosis) (HCC)    LLE DVT 01/13/19   Dyspnea    Esophageal reflux    Essential hypertension 08/27/2019   PE (pulmonary thromboembolism) ([place]) 01/29/2019   Pneumonia    Wears partial dentures     Past Surgical History:  Procedure Laterality Date   ABDOMINAL HYSTERECTOMY     ABDOMINAL HYSTERECTOMY     CATARACT EXTRACTION W/ INTRAOCULAR LENS  IMPLANT, BILATERAL     CHOLECYSTECTOMY N/A 08/12/2021   Procedure: LAPAROSCOPIC CHOLECYSTECTOMY;  Surgeon: Felicie Morn, MD;  Location: [facility];  Service: General;  Laterality: N/A;   COLONOSCOPY     COLONOSCOPY WITH PROPOFOL N/A 08/20/2019   Procedure: COLONOSCOPY WITH PROPOFOL;  Surgeon: Ronald Lobo, MD;  Location: [place];  Service: Endoscopy;  Laterality: N/A;   CYSTOSCOPY     DILATION AND CURETTAGE OF UTERUS     ECTROPION REPAIR Bilateral 05/12/2021   Procedure: REPAIR OF ECTROPION BY LATERAL TARSAL STRIP OF BILATERAL LOWER EYE LIDS;  Surgeon: Delia Chimes, MD;  Location: [place];  Service: Ophthalmology;  Laterality: Bilateral;   ESOPHAGOGASTRODUODENOSCOPY (EGD) WITH PROPOFOL N/A 08/20/2019   Procedure: ESOPHAGOGASTRODUODENOSCOPY (EGD) WITH PROPOFOL;  Surgeon: Ronald Lobo, MD;  Location: [place];  Service: Endoscopy;  Laterality: N/A;   HEMOSTASIS CONTROL  08/20/2019   Procedure: HEMOSTASIS CONTROL;  Surgeon: Ronald Lobo, MD;  Location: [facility];  Service: Endoscopy;;   INTRAVASCULAR ULTRASOUND/IVUS Left 05/26/2019   Procedure: Intravascular  Ultrasound/IVUS;  Surgeon: Waynetta Sandy, MD;  Location: [place] [facility];  Service: Cardiovascular;  Laterality: Left;   IVC FILTER REMOVAL N/A 03/24/2019   Procedure: IVC FILTER REMOVAL;  Surgeon: Waynetta Sandy, MD;  Location: [place] [facility];  Service: Cardiovascular;  Laterality: N/A;   LACRIMAL TUBE INSERTION Right 05/12/2021   Procedure: RIGHT NASOLACRIMAL DUCT PROBE WITH MINI MONOKA STENT PLACEMENT;  Surgeon: Delia Chimes, MD;  Location: [place];  Service: Ophthalmology;  Laterality: Right;   LESION EXCISION WITH COMPLEX REPAIR N/A 05/12/2021   Procedure: NASAL LESION EXCISION;  Surgeon: Delia Chimes, MD;  Location: [place];  Service: Ophthalmology;  Laterality: N/A;   LOWER EXTREMITY VENOGRAPHY Left 05/26/2019   Procedure: LOWER EXTREMITY VENOGRAPHY;  Surgeon: Waynetta Sandy, MD;  Location: [place] [facility];  Service: Cardiovascular;  Laterality: Left;   MULTIPLE TOOTH EXTRACTIONS     NASAL SINUS SURGERY     PERIPHERAL VASCULAR INTERVENTION Left 05/26/2019   Procedure: PERIPHERAL VASCULAR INTERVENTION;  Surgeon: Waynetta Sandy, MD;  Location: [place] [facility];  Service: Cardiovascular;  Laterality: Left;  lower extremity veins   PERIPHERAL VASCULAR THROMBECTOMY  05/26/2019   Procedure: PERIPHERAL VASCULAR THROMBECTOMY;  Surgeon: Waynetta Sandy, MD;  Location: [place] [facility];  Service: Cardiovascular;;   PTOSIS REPAIR Bilateral 05/12/2021   Procedure: EXTERNAL PTOSIS REPAIR LEVATOR ADVANCEDMENT/RESECTION OF BILATERAL UPPER EYE LIDS;  Surgeon: Delia Chimes, MD;  Location: [place];  Service: Ophthalmology;  Laterality: Bilateral;   TUBAL LIGATION     VENA CAVA FILTER PLACEMENT N/A 01/13/2019   Procedure: INSERTION VENA-CAVA FILTER;  Surgeon: Waynetta Sandy, MD;  Location: [place];  Service: Vascular;  Laterality: N/A;    Social history:  reports that she quit smoking about 23 years ago. Her smoking use  included cigarettes. She has a 10.00 pack-year smoking history. She has never used smokeless tobacco. She reports that she does not drink alcohol and does not use drugs.   Allergies  Allergen Reactions   Pneumococcal Vaccines Shortness Of Breath and Other (See Comments)    Couldn't breathe    Clarithromycin Swelling   Estradiol Rash    Reaction to cream   Sulfa Antibiotics Nausea And Vomiting and Swelling   Doxycycline Hyclate     Other reaction(s): Other (See Comments), stomach distress   Penicillins Swelling    Tongue swelling Did it involve swelling of the face/tongue/throat, SOB, or low BP? Yes Did it involve sudden or severe rash/hives, skin peeling, or any reaction on the inside of your mouth or nose? Yes Did you need to seek medical attention at a hospital or doctor's office? Yes When did it last happen?  childhood If all above answers are "NO", may proceed with cephalosporin use.    Pneumococcal Polysaccharide Vaccine     Other reaction(s): Unknown   Sulfacetamide Sodium-Sulfur Other (See Comments)   Sulfonamide Derivatives Swelling    Family History  Problem Relation Age of Onset   Diabetes Mother    Coronary artery disease Mother    Emphysema Father    Coronary artery disease Brother  Prior to Admission medications   Medication Sig Start Date End Date Taking? Authorizing Provider  acetaminophen (TYLENOL) 500 MG tablet Take 500 mg by mouth every 6 (six) hours as needed for headache (pain).    [provider]  albuterol (ACCUNEB) 0.63 MG/3ML nebulizer solution Take 1 ampule by nebulization every 6 (six) hours as needed for shortness of breath or wheezing. 11/14/21   [provider]  albuterol (VENTOLIN HFA) 108 (90 Base) MCG/ACT inhaler Inhale 1-2 puffs into the lungs every 6 (six) hours as needed for wheezing or shortness of breath.    [provider]  aspirin EC 81 MG tablet Take 81 mg by mouth daily. Swallow whole.    [provider]  cetirizine (ZYRTEC) 10 MG tablet Take 10 mg by mouth daily as needed for allergies.    [provider]  doxycycline (VIBRA-TABS) 100 MG tablet Take 1 tablet (100 mg total) by mouth 2 (two) times daily. 12/08/21   Young, Kasandra Knudsen, MD  ELIQUIS 5 MG TABS tablet Take 5 mg by mouth 2 (two) times daily. 12/01/21   [provider]  escitalopram (LEXAPRO) 20 MG tablet Take 20 mg by mouth daily.    [provider]  fluticasone-salmeterol (ADVAIR) 250-50 MCG/ACT AEPB Inhale 1 puff then rinse mouth, twice daily Patient taking differently: Inhale 1 puff into the lungs in the morning and at bedtime. Inhale 1 puff then rinse mouth, twice daily 09/29/20   Baird Lyons D, MD  levofloxacin (LEVAQUIN) 750 MG tablet Take 1 tablet (750 mg total) by mouth daily. 12/27/21   Deneise Lever, MD  LORazepam (ATIVAN) 1 MG tablet Take 1 mg by mouth at bedtime.    [provider]  omeprazole (PRILOSEC) 20 MG capsule Take 20 mg by mouth daily. 03/26/20   [provider]  theophylline (THEO-24) 400 MG 24 hr capsule Take 1 capsule (400 mg total) by mouth daily. 11/24/21   Baird Lyons D, MD  theophylline (UNIPHYL) 400 MG 24 hr tablet Take 400 mg by mouth daily. 12/23/21   [provider]  traZODone (DESYREL) 100 MG tablet Take 100 mg by mouth at bedtime.    [provider]  triamterene-hydrochlorothiazide (MAXZIDE-25) 37.5-25 MG tablet Take 0.5 tablets by mouth daily. 02/06/19   [provider]    Physical Exam: Vitals:   01/23/22 1200 01/23/22 1221 01/23/22 1344 01/23/22 1415  BP: (!) 130/103  127/84 120/87  Pulse: 87 85 87 84  Resp: '17 17 13 18  '$ Temp:  98 F (36.7 C)    TempSrc:  Oral    SpO2: 96% 94% 92% 90%  Weight:      Height:        Constitutional: NAD, calm, comfortable mostly, slightly dyspneic on talking full sentences Eyes: PERRL, lids and conjunctivae normal ENMT: Mucous membranes are moist. Posterior pharynx clear of  any exudate or lesions.Normal dentition.  Neck: normal, supple, no masses, no thyromegaly Respiratory: Somewhat dyspneic on talking full sentences, clear to auscultation bilaterally, no wheezing, no crackles. Normal respiratory effort. No accessory muscle use.  Cardiovascular: Regular rate and rhythm, no murmurs / rubs / gallops. No extremity edema. 2+ pedal pulses. No carotid bruits.  Abdomen: no tenderness, no masses palpated. No hepatosplenomegaly. Bowel sounds positive.  Musculoskeletal: no clubbing / cyanosis. No joint deformity upper and lower extremities. Good ROM, no contractures. Normal muscle tone.  Neurologic: CN 2-12 grossly intact. Sensation intact, DTR normal. Strength 5/5 in all 4.  Psychiatric: Normal judgment  and insight. Alert and oriented x 3. Normal mood.  SKIN/catheters: no rashes, lesions, ulcers. No induration  Labs on Admission: I have personally reviewed following labs and imaging studies  CBC: Recent Labs  Lab 01/23/22 1003  WBC 12.1*  NEUTROABS 9.5*  HGB 15.3*  HCT 47.2*  MCV 94.0  PLT 591   Basic Metabolic Panel: Recent Labs  Lab 01/23/22 0826  NA 140  K 3.9  CL 103  CO2 29  GLUCOSE 106*  BUN 23  CREATININE 1.12*  CALCIUM 9.1   GFR: Estimated Creatinine Clearance: 43.2 mL/min (A) (by C-G formula based on SCr of 1.12 mg/dL (H)). Recent Labs  Lab 01/23/22 1003  WBC 12.1*   Liver Function Tests: No results for input(s): "AST", "ALT", "ALKPHOS", "BILITOT", "PROT", "ALBUMIN" in the last 168 hours. No results for input(s): "LIPASE", "AMYLASE" in the last 168 hours. No results for input(s): "AMMONIA" in the last 168 hours. Coagulation Profile: No results for input(s): "INR", "PROTIME" in the last 168 hours. Cardiac Enzymes: No results for input(s): "CKTOTAL", "CKMB", "CKMBINDEX", "TROPONINI" in the last 168 hours. BNP (last 3 results) No results for input(s): "PROBNP" in the last 8760 hours. HbA1C: No results for input(s): "HGBA1C" in the  last 72 hours. CBG: No results for input(s): "GLUCAP" in the last 168 hours. Lipid Profile: No results for input(s): "CHOL", "HDL", "LDLCALC", "TRIG", "CHOLHDL", "LDLDIRECT" in the last 72 hours. Thyroid Function Tests: No results for input(s): "TSH", "T4TOTAL", "FREET4", "T3FREE", "THYROIDAB" in the last 72 hours. Anemia Panel: No results for input(s): "VITAMINB12", "FOLATE", "FERRITIN", "TIBC", "IRON", "RETICCTPCT" in the last 72 hours. Urine analysis:    Component Value Date/Time   COLORURINE YELLOW 11/22/2019 0539   APPEARANCEUR CLOUDY (A) 11/22/2019 0539   LABSPEC >1.030 (H) 11/22/2019 0539   PHURINE 5.5 11/22/2019 0539   GLUCOSEU NEGATIVE 11/22/2019 0539   HGBUR TRACE (A) 11/22/2019 0539   BILIRUBINUR SMALL (A) 11/22/2019 0539   KETONESUR NEGATIVE 11/22/2019 0539   PROTEINUR 30 (A) 11/22/2019 0539   NITRITE NEGATIVE 11/22/2019 0539   LEUKOCYTESUR SMALL (A) 11/22/2019 0539    Radiological Exams on Admission: Personally reviewed  CT Angio Chest PE W and/or Wo Contrast  Result Date: 01/23/2022 CLINICAL DATA:  Chest pain, cough, hemoptysis EXAM: CT ANGIOGRAPHY CHEST WITH CONTRAST TECHNIQUE: Multidetector CT imaging of the chest was performed using the standard protocol during bolus administration of intravenous contrast. Multiplanar CT image reconstructions and MIPs were obtained to evaluate the vascular anatomy. RADIATION DOSE REDUCTION: This exam was performed according to the departmental dose-optimization program which includes automated exposure control, adjustment of the mA and/or kV according to patient size and/or use of iterative reconstruction technique. CONTRAST:  57m OMNIPAQUE IOHEXOL 350 MG/ML SOLN COMPARISON:  Previous studies including the CT done on 0June 23, 2023and chest radiographs done today FINDINGS: Cardiovascular: There is no evidence of pulmonary artery embolism. There is ectasia of the main pulmonary artery measuring 3.5 cm suggesting pulmonary arterial  hypertension. There is homogeneous enhancement in thoracic aorta. There is ectasia of ascending thoracic aorta measuring 4 cm. Coronary artery calcifications are seen. Mediastinum/Nodes: No significant lymphadenopathy is seen. Lungs/Pleura: There is interval appearance of large patchy alveolar infiltrate in right lower lobe suggesting pneumonia. There is 8.7 x 5 cm cavitary lesion with air-fluid level in superior segment of right lower lobe. Rest of the lung fields show no focal infiltrates. There is no significant pleural effusion or pneumothorax. Upper Abdomen: No acute findings are seen. Musculoskeletal: Visualized bony structures are unremarkable. Augmentation/reconstruction prostheses  are seen in both breasts. Review of the MIP images confirms the above findings. IMPRESSION: There is new large alveolar infiltrate in right lower lobe suggesting pneumonia. There is a large cavitary lesion with air-fluid level in the superior segment of right lower lobe suggesting possible lung abscess. There is no evidence of pulmonary artery embolism. There is ectasia of main pulmonary artery suggesting pulmonary arterial hypertension. There is no evidence of thoracic aortic dissection. Coronary artery calcifications are seen. There is 4 cm ascending thoracic aneurysm with no significant change. Recommend annual imaging followup by CTA or MRA. This recommendation follows 2010 ACCF/AHA/AATS/ACR/ASA/SCA/SCAI/SIR/STS/SVM Guidelines for the Diagnosis and Management of Patients with Thoracic Aortic Disease. Circulation. 2010; 121: T557-D220. Aortic aneurysm NOS (ICD10-I71.9) Electronically Signed   By: Elmer Picker M.D.   On: 01/23/2022 13:02   DG Chest 2 View  Result Date: 01/23/2022 CLINICAL DATA:  Shortness of breath.  Recent diagnosis of pneumonia. EXAM: CHEST - 2 VIEW COMPARISON:  CT 12/12/2021 FINDINGS: Normal heart size. No pleural effusion identified. There is diffuse airspace disease throughout the right lower  lobe, most severe within the superior segment. Left lung appears clear. Visualized osseous structures are unremarkable. IMPRESSION: 1. New right lower lobe airspace disease compatible with pneumonia. Followup PA and lateral chest X-ray is recommended in 3-4 weeks following trial of antibiotic therapy to ensure resolution and exclude underlying malignancy. Electronically Signed   By: Kerby Moors M.D.   On: 01/23/2022 08:50    EKG: Independently reviewed.  Normal sinus rhythm, QTc 469 ms     Assessment and Plan:   Principal Problem:   Cavitary pneumonia Active Problems:   COPD mixed type (HCC)   Esophageal reflux   Sciatica   Vertebral compression fracture (HCC)   Chronic respiratory failure with hypoxia (HCC)   DVT (deep venous thrombosis) (HCC)   Acute pulmonary embolism with acute cor pulmonale (HCC)   Dyslipidemia   Anxiety and depression    1.  Cavitary pneumonia: Likely related to chronic aspiration versus Pseudomonas versus MRSA pneumonia.  Treated with Levaquin/doxycycline as outpatient per MAR. Seen by PCCM and plan for bronch in AM.  Patient thus far received IV ceftriaxone, cefepime and vancomycin in the ED.  Okay to admit with IV cefepime and clindamycin per Dr. [place] Bells.  WBC 12.6 K.  Ordered blood cultures although not very toxic appearing.  Will hold Eliquis in anticipation of procedures.  COVID screen negative.  Will defer sputum cultures as planning for bronch.  Neb treatments as needed  2.  COPD/asthma with chronic respiratory failure: Patient has been using O2 as needed although appears like hypoxia not new for her.  Continue O2 2 L for now.  Neb treatments as needed.  She did receive 1 dose of IV Solu-Medrol in the ED.  3.  History of DVT/PE: Patient apparently had IVC filter and?  Stenting done for lower extremity DVT in the past.  She however was resumed on Eliquis recently after an upper extremity DVT diagnosis.  She is noted to have diffuse bruising along  extremities and relates to Eliquis usage.    4.  Hemoptysis: Related to problem #1 versus small vessel rupture from dry cough in the setting of anticoagulant use.  Monitor hemoglobin and progress with above management.    5. Esophageal reflux disease: Resume home meds.  6.  Hypertension: Resume triamterene/HCTZ  7. Anxiety/depression: Resume home meds-trazodone.   DVT prophylaxis: On Eliquis--to be resumed after bronchoscopy if no concerns for hemoptysis.  COVID screen:  Negative  Code Status: Full code as confirmed with patient and daughter-in-law at bedside Patient/Family Communication: Discussed with patient and all questions answered to satisfaction.  Consults called: PCCM Admission status :I certify that at the point of admission it is my clinical judgment that the patient will require inpatient hospital care spanning beyond 2 midnights from the point of admission due to high intensity of service and high frequency of surveillance required.Inpatient status is judged to be reasonable and necessary in order to provide the required intensity of service to ensure the patient's safety. The patient's presenting symptoms, physical exam findings, and initial radiographic and laboratory data in the context of their chronic comorbidities is felt to place them at high risk for further clinical deterioration. The following factors support the patient status of inpatient : Cavitary pneumonia requiring IV antibiotics after failure of outpatient management     Guilford Shi MD [facility] Pager in [place]  If 7PM-7AM, please contact night-coverage [email or web address]   01/23/2022, 3:41 PM

## 2022-01-24 ENCOUNTER — Inpatient Hospital Stay (HOSPITAL_COMMUNITY): Payer: PPO | Admitting: Anesthesiology

## 2022-01-24 ENCOUNTER — Inpatient Hospital Stay (HOSPITAL_COMMUNITY): Payer: PPO

## 2022-01-24 ENCOUNTER — Encounter (HOSPITAL_COMMUNITY)
Admission: EM | Disposition: A | Discharge status: Home / Self Care | Payer: Self-pay | Source: Home / Self Care | Attending: Internal Medicine

## 2022-01-24 DIAGNOSIS — J9809 Other diseases of bronchus, not elsewhere classified: Secondary | ICD-10-CM

## 2022-01-24 DIAGNOSIS — J984 Other disorders of lung: Secondary | ICD-10-CM | POA: Diagnosis not present

## 2022-01-24 DIAGNOSIS — Z87891 Personal history of nicotine dependence: Secondary | ICD-10-CM | POA: Diagnosis not present

## 2022-01-24 DIAGNOSIS — J449 Chronic obstructive pulmonary disease, unspecified: Secondary | ICD-10-CM | POA: Diagnosis not present

## 2022-01-24 DIAGNOSIS — I2782 Chronic pulmonary embolism: Secondary | ICD-10-CM | POA: Diagnosis not present

## 2022-01-24 DIAGNOSIS — M5430 Sciatica, unspecified side: Secondary | ICD-10-CM

## 2022-01-24 DIAGNOSIS — I10 Essential (primary) hypertension: Secondary | ICD-10-CM

## 2022-01-24 DIAGNOSIS — J189 Pneumonia, unspecified organism: Secondary | ICD-10-CM | POA: Diagnosis not present

## 2022-01-24 DIAGNOSIS — K219 Gastro-esophageal reflux disease without esophagitis: Secondary | ICD-10-CM | POA: Diagnosis not present

## 2022-01-24 HISTORY — PX: BRONCHIAL WASHINGS: SHX5105

## 2022-01-24 HISTORY — PX: VIDEO BRONCHOSCOPY: SHX5072

## 2022-01-24 LAB — CBC
HCT: 43.9 % (ref 36.0–46.0)
Hemoglobin: 14.4 g/dL (ref 12.0–15.0)
MCH: 30.7 pg (ref 26.0–34.0)
MCHC: 32.8 g/dL (ref 30.0–36.0)
MCV: 93.6 fL (ref 80.0–100.0)
Platelets: 346 10*3/uL (ref 150–400)
RBC: 4.69 MIL/uL (ref 3.87–5.11)
RDW: 14 % (ref 11.5–15.5)
WBC: 13.9 10*3/uL — ABNORMAL HIGH (ref 4.0–10.5)
nRBC: 0 % (ref 0.0–0.2)

## 2022-01-24 LAB — MRSA NEXT GEN BY PCR, NASAL: MRSA by PCR Next Gen: NOT DETECTED

## 2022-01-24 SURGERY — VIDEO BRONCHOSCOPY WITHOUT FLUORO
Anesthesia: General

## 2022-01-24 MED ORDER — OXYCODONE HCL 5 MG/5ML PO SOLN: 5.0000 mg | Freq: Once | ORAL | Status: DC | PRN

## 2022-01-24 MED ORDER — MEPERIDINE HCL 25 MG/ML IJ SOLN: 6.2500 mg | INTRAMUSCULAR | Status: DC | PRN

## 2022-01-24 MED ORDER — LACTATED RINGERS IV SOLN: INTRAVENOUS | Status: DC | PRN

## 2022-01-24 MED ORDER — ACETAMINOPHEN 160 MG/5ML PO SOLN: 325.0000 mg | ORAL | Status: DC | PRN

## 2022-01-24 MED ORDER — ONDANSETRON HCL 4 MG/2ML IJ SOLN
INTRAMUSCULAR | Status: DC | PRN
  Administered 2022-01-24: 4 mg via INTRAVENOUS

## 2022-01-24 MED ORDER — FENTANYL CITRATE (PF) 100 MCG/2ML IJ SOLN: 25.0000 ug | INTRAMUSCULAR | Status: DC | PRN

## 2022-01-24 MED ORDER — FENTANYL CITRATE (PF) 100 MCG/2ML IJ SOLN
INTRAMUSCULAR | Status: AC
  Filled 2022-01-24: qty 2

## 2022-01-24 MED ORDER — ROCURONIUM BROMIDE 10 MG/ML (PF) SYRINGE
PREFILLED_SYRINGE | INTRAVENOUS | Status: DC | PRN
  Administered 2022-01-24: 30 mg via INTRAVENOUS

## 2022-01-24 MED ORDER — OXYCODONE HCL 5 MG PO TABS: 5.0000 mg | ORAL_TABLET | Freq: Once | ORAL | Status: DC | PRN

## 2022-01-24 MED ORDER — SUGAMMADEX SODIUM 200 MG/2ML IV SOLN
INTRAVENOUS | Status: DC | PRN
  Administered 2022-01-24: 200 mg via INTRAVENOUS

## 2022-01-24 MED ORDER — ONDANSETRON HCL 4 MG/2ML IJ SOLN: 4.0000 mg | Freq: Once | INTRAMUSCULAR | Status: DC | PRN

## 2022-01-24 MED ORDER — DEXAMETHASONE SODIUM PHOSPHATE 10 MG/ML IJ SOLN
INTRAMUSCULAR | Status: DC | PRN
  Administered 2022-01-24: 10 mg via INTRAVENOUS

## 2022-01-24 MED ORDER — ACETAMINOPHEN 325 MG PO TABS: 325.0000 mg | ORAL_TABLET | ORAL | Status: DC | PRN

## 2022-01-24 MED ORDER — PROPOFOL 10 MG/ML IV BOLUS
INTRAVENOUS | Status: DC | PRN
  Administered 2022-01-24: 170 mg via INTRAVENOUS

## 2022-01-24 MED ORDER — IPRATROPIUM-ALBUTEROL 0.5-2.5 (3) MG/3ML IN SOLN
3.0000 mL | Freq: Four times a day (QID) | RESPIRATORY_TRACT | Status: DC
Start: 2022-01-24 — End: 2022-01-25
  Administered 2022-01-24 – 2022-01-25 (×4): 3 mL via RESPIRATORY_TRACT
  Filled 2022-01-24 (×4): qty 3

## 2022-01-24 MED ORDER — FENTANYL CITRATE (PF) 250 MCG/5ML IJ SOLN
INTRAMUSCULAR | Status: DC | PRN
  Administered 2022-01-24 (×2): 50 ug via INTRAVENOUS

## 2022-01-24 MED ORDER — LIDOCAINE 2% (20 MG/ML) 5 ML SYRINGE
INTRAMUSCULAR | Status: DC | PRN
  Administered 2022-01-24: 100 mg via INTRAVENOUS

## 2022-01-24 MED ORDER — PROPOFOL 500 MG/50ML IV EMUL
INTRAVENOUS | Status: DC | PRN
  Administered 2022-01-24: 150 ug/kg/min via INTRAVENOUS

## 2022-01-24 NOTE — Care Management (Signed)
  Transition of Care [facility]) Screening Note   Patient Details  Name: [name] Date of Birth: August 19, 1940   Transition of [facility]) CM/SW Contact:    Carles Collet, RN Phone Number: 01/24/2022, 4:31 PM  81 yo female with the past medical history of Asthma and COPD, chronic hypoxemic respiratory failure, DVT/PE sp IVC filter (removed), GERD and anemia who presented with worsening dyspnea and hemoptysis and a working diagnosis of cavitary pneumonia.  Failed outpatient treatment  Acutely requiring 4L O2.   Transition of Care [facility]) has reviewed patient and we will continue to monitor patient advancement through interdisciplinary progression rounds.

## 2022-01-24 NOTE — Op Note (Signed)
[facility] Cardiopulmonary Patient Name: [name] Date: 01/24/2022 MRN: [id number] Attending MD: Juanito Doom , MD Date of Birth: 1941/04/05 CSN: Finalized Age: 81 Admit Type: Inpatient Gender: Female Procedure:             Bronchoscopy Indications:           Hemoptysis with abnormal CXR, Right lower lobe                         cavitary lesion Providers:             Nathaneil Canary B. Lake Bells, MD, Jaci Carrel, RN, William Dalton, Technician Referring MD:           Medicines:             General Anesthesia Complications:         No immediate complications Estimated Blood Loss:  Estimated blood loss: none. Procedure:             Pre-Anesthesia Assessment:                        - A History and Physical has been performed. Patient                         meds and allergies have been reviewed. The risks and                         benefits of the procedure and the sedation options and                         risks were discussed with the patient. All questions                         were answered and informed consent was obtained.                         Patient identification and proposed procedure were                         verified prior to the procedure by the physician and                         the nurse in the pre-procedure area. Mental Status                         Examination: alert and oriented. Airway Examination:                         normal oropharyngeal airway. Respiratory Examination:                         clear to auscultation. CV Examination: normal. ASA                         Grade Assessment: II - A patient with mild systemic  disease. After reviewing the risks and benefits, the                         patient was deemed in satisfactory condition to                         undergo the procedure. The anesthesia plan was to use                         general anesthesia.  Immediately prior to                         administration of medications, the patient was                         re-assessed for adequacy to receive sedatives. The                         heart rate, respiratory rate, oxygen saturations,                         blood pressure, adequacy of pulmonary ventilation, and                         response to care were monitored throughout the                         procedure. The physical status of the patient was                         re-assessed after the procedure.                        After obtaining informed consent, the bronchoscope was                         passed under direct vision. Throughout the procedure,                         the patient's blood pressure, pulse, and oxygen                         saturations were monitored continuously. the [id number]                         ([id number]) Olympus bronchoscope was introduced through                         the mouth, via the endotracheal tube and advanced to                         the tracheobronchial tree. The procedure was                         accomplished without difficulty. The patient tolerated                         the procedure well. The total duration of the  procedure was 15 minutes. Scope In: Scope Out: Findings:      The scope was passed through the ETT. The trachea is of normal caliber.       The carina is sharp. The tracheobronchial tree of the left lung was       examined to at least the first subsegmental level. Bronchial mucosa and       anatomy in the left lung are normal; there are no endobronchial lesions,       and no secretions.      Right Lung Abnormalities: A blood clot was found in the right mainstem       bronchus. It was partially obstructing the airway. The clot was       successfully removed, with no significant bleeding. Bronchitic changes       were found. Fresh blood was found in the superior segment of the right        lower lobe (B6). The bleeding site was identified, there was minimal       bleeding noted. No airway tumor or mass noted. Impression:            - Hemoptysis with abnormal CXR                        - Right lower lobe cavitary lesion                        - The airway examination of the left lung was normal.                        - A blood clot was found in the right mainstem                         bronchus.                        - Bronchitic changes were found.                        - Blood was present in the superior segment of the                         right lower lobe (B6).                        - No specimens collected. Moderate Sedation:      General Anesthesia Recommendation:        - Await BAL, culture and cytology results. Procedure Code(s):     --- Professional ---                        [phone], Bronchoscopy, rigid or flexible, including                         fluoroscopic guidance, when performed; diagnostic,                         with cell washing, when performed (separate procedure) Diagnosis Code(s):     --- Professional ---                        R04.2, Hemoptysis  R91.8, Other nonspecific abnormal finding of lung field                        J98.09, Other diseases of bronchus, not elsewhere                         classified CPT copyright 2019 American Medical Association. All rights reserved. The codes documented in this report are preliminary and upon coder review may  be revised to meet current compliance requirements. Norlene Campbell, MD Juanito Doom, MD 01/24/2022 10:36:00 AM This report has been signed electronically. Number of Addenda: 0

## 2022-01-24 NOTE — Anesthesia Procedure Notes (Signed)
Procedure Name: Intubation Date/Time: 01/24/2022 9:52 AM  Performed by: Griffin Dakin, CRNAPre-anesthesia Checklist: Patient identified, Emergency Drugs available, Suction available and Patient being monitored Patient Re-evaluated:Patient Re-evaluated prior to induction Oxygen Delivery Method: Circle system utilized Preoxygenation: Pre-oxygenation with 100% oxygen Induction Type: IV induction Ventilation: Mask ventilation without difficulty Laryngoscope Size: Mac and 4 Grade View: Grade I Tube type: Oral Tube size: 8.5 mm Number of attempts: 1 Airway Equipment and Method: Stylet and Oral airway Placement Confirmation: ETT inserted through vocal cords under direct vision, positive ETCO2 and breath sounds checked- equal and bilateral Secured at: 23 cm Tube secured with: Tape Dental Injury: Teeth and Oropharynx as per pre-operative assessment

## 2022-01-24 NOTE — H&P (Signed)
[facility] PCCM  CC: Dyspnea, hemoptysis HPI: 81 y/o female with COPD presented with several days of dyspnea and hemoptysis, found to have what appears to be a large pulmonary abscess in the superior segment of RLL.  Here now for bronchoscopy.  Past Medical History:  Diagnosis Date   Allergic rhinitis    Anemia due to GI blood loss    Anxiety    Asthma    Chronic airway obstruction, not elsewhere classified    Depressive disorder, not elsewhere classified    DVT (deep venous thrombosis) (HCC)    LLE DVT 01/13/19   Dyspnea    Esophageal reflux    Essential hypertension 08/27/2019   PE (pulmonary thromboembolism) ([place]) 01/29/2019   Pneumonia    Wears partial dentures      Family History  Problem Relation Age of Onset   Diabetes Mother    Coronary artery disease Mother    Emphysema Father    Coronary artery disease Brother      Social History   Socioeconomic History   Marital status: Single    Spouse name: Not on file   Number of children: Not on file   Years of education: Not on file   Highest education level: Not on file  Occupational History   Occupation: Disabled  Tobacco Use   Smoking status: Former    Packs/day: 0.25    Years: 40.00    Total pack years: 10.00    Types: Cigarettes    Quit date: 2000    Years since quitting: 23.6   Smokeless tobacco: Never  Vaping Use   Vaping Use: Never used  Substance and Sexual Activity   Alcohol use: No   Drug use: No   Sexual activity: Not on file  Other Topics Concern   Not on file  Social History Narrative   Not on file   Social Determinants of Health   Financial Resource Strain: Not on file  Food Insecurity: Not on file  Transportation Needs: Not on file  Physical Activity: Not on file  Stress: Not on file  Social Connections: Not on file  Intimate Partner Violence: Not on file     Allergies  Allergen Reactions   Pneumococcal Vaccines Shortness Of Breath and Other (See Comments)    Couldn't breathe     Clarithromycin Swelling   Estradiol Rash    Reaction to cream   Sulfa Antibiotics Nausea And Vomiting and Swelling   Doxycycline Hyclate     Other reaction(s): Other (See Comments), stomach distress   Penicillins Swelling    Tongue swelling Did it involve swelling of the face/tongue/throat, SOB, or low BP? Yes Did it involve sudden or severe rash/hives, skin peeling, or any reaction on the inside of your mouth or nose? Yes Did you need to seek medical attention at a hospital or doctor's office? Yes When did it last happen?  childhood If all above answers are "NO", may proceed with cephalosporin use.    Pneumococcal Polysaccharide Vaccine     Other reaction(s): Unknown   Sulfacetamide Sodium-Sulfur Other (See Comments)   Sulfonamide Derivatives Swelling     _0 @  Vitals:   01/24/22 0100 01/24/22 0400 01/24/22 0830 01/24/22 0854  BP: 109/70 (!) 109/58  (!) 151/85  Pulse: 88 87  89  Resp: _1 Temp:  98.1 F (36.7 C)  (!) 97 F (36.1 C)  TempSrc:  Oral  Temporal  SpO2: 94% 94% 94% 94%  Weight:  Height:      .d  General:  Resting comfortably in bed HENT: NCAT OP clear PULM: wheezing B, normal effort CV: RRR, no mgr GI: BS+, soft, nontender MSK: normal bulk and tone Neuro: awake, alert, no distress, MAEW  Impression: Pulmonary abscess COPD, advanced Hemoptysis Recurrent DVT/PE  Plan: Diagnostic bronchoscopy with BAL Eliquis held last night, will not perform biopsy She was informed of the risks and benefits and is willing to proceed.  Roselie Awkward, MD [place] [facility] Pager: [phone] Cell: [phone] After 7:00 pm call Elink  [phone]

## 2022-01-24 NOTE — Assessment & Plan Note (Signed)
Continue with lorazepam at night.

## 2022-01-24 NOTE — Progress Notes (Signed)
Pt transported to endo.  

## 2022-01-24 NOTE — Assessment & Plan Note (Addendum)
Right lower lobe community acquired pneumonia, complicated with pulmonary abscess at the superior segment of the right lower lobe.   08/29 bronchoscopy with positive blood clot in the right main bronchus and superior segment of right lower lobe.  Plan to continue antibiotic therapy with cefepime and clindamycin.  Follow up on bronch cultures and gram stain. Bronchodilator therapy.  Further work up with swallow evaluation. Collect sputum to quantify hemoptysis.   Acute on chronic hypoxemic respiratory failure. 02 saturation this am is 93% on 2,5 L min per [place] Continue close oxymetry monitoring.

## 2022-01-24 NOTE — Transfer of Care (Signed)
Immediate Anesthesia Transfer of Care Note  Patient: [name]  Procedure(s) Performed: VIDEO BRONCHOSCOPY WITHOUT FLUORO BRONCHIAL WASHINGS  Patient Location: PACU  Anesthesia Type:General  Level of Consciousness: awake, alert  and oriented  Airway & Oxygen Therapy: Patient Spontanous Breathing and Patient connected to face mask oxygen  Post-op Assessment: Report given to RN and Post -op Vital signs reviewed and stable  Post vital signs: Reviewed and stable  Last Vitals:  Vitals Value Taken Time  BP 146/93 01/24/22 1018  Temp    Pulse 92 01/24/22 1019  Resp 22 01/24/22 1019  SpO2 89 % 01/24/22 1019  Vitals shown include unvalidated device data.  Last Pain:  Vitals:   01/24/22 0854  TempSrc: Temporal  PainSc: 0-No pain         Complications: No notable events documented.

## 2022-01-24 NOTE — Assessment & Plan Note (Signed)
Continue with proton pump inhibitors.

## 2022-01-24 NOTE — Anesthesia Preprocedure Evaluation (Signed)
Anesthesia Evaluation  Patient identified by MRN, date of birth, ID band Patient awake    Reviewed: Allergy & Precautions, NPO status , Patient's Chart, lab work & pertinent test results  Airway Mallampati: II  TM Distance: >3 FB Neck ROM: Full    Dental no notable dental hx. (+) Partial Upper, Partial Lower,    Pulmonary asthma , COPD, former smoker,    Pulmonary exam normal breath sounds clear to auscultation       Cardiovascular hypertension, Pt. on medications + DVT  Normal cardiovascular exam Rhythm:Regular Rate:Normal  01/2019 Echo 1. The left ventricle has normal systolic function, with an ejection  fraction of 55-60%. The cavity size was normal. Left ventricular diastolic  parameters were normal. No evidence of left ventricular regional wall  motion abnormalities.  2. The right ventricle has low normal systolic function. The cavity was  normal. There is no increase in right ventricular wall thickness. Right  ventricular systolic pressure is mildly elevated with an estimated  pressure of 33.7 mmHg.  3. The aorta is normal unless otherwise noted.  4. The interatrial septum was not assessed.    Neuro/Psych Depression    GI/Hepatic Neg liver ROS, GERD  Medicated,  Endo/Other    Renal/GU negative Renal ROSLab Results      Component                Value               Date                      CREATININE               1.06 (H)            08/02/2021                K                        4.3                 08/02/2021                     Musculoskeletal   Abdominal   Peds  Hematology  (+) Blood dyscrasia, anemia ,   Anesthesia Other Findings All: PCN , Clarithromycin, Sulfa  Reproductive/Obstetrics                             Anesthesia Physical  Anesthesia Plan  ASA: 3  Anesthesia Plan: General   Post-op Pain Management: Minimal or no pain anticipated   Induction:  Intravenous  PONV Risk Score and Plan: 3 and Treatment may vary due to age or medical condition, Dexamethasone and Ondansetron  Airway Management Planned: Oral ETT  Additional Equipment: None  Intra-op Plan:   Post-operative Plan: Extubation in OR  Informed Consent: I have reviewed the patients History and Physical, chart, labs and discussed the procedure including the risks, benefits and alternatives for the proposed anesthesia with the patient or authorized representative who has indicated his/her understanding and acceptance.     Dental advisory given  Plan Discussed with: CRNA and Anesthesiologist  Anesthesia Plan Comments: (HPI: [name] is a 81 y.o. female with history h/o asthma/COPD who uses O2 only as needed-mostly at night, history of DVT /PE s/p IVC filter removal and now on Eliquis, GERD, chronic anemia, presents with  failure of OP rx for RLL PNA (was on doxy/Levaquin) , persistent cough and now hemoptysis.  Patient states she presented to urgent care with worsening dyspnea few weeks back for which she was prescribed doxycycline and then Levaquin-she reports completing antibiotics past Saturday.  She however did not feel any improvement in terms of cough or dyspnea.  Furthermore she started developing bloody phlegm which prompted this ED visit.  She states cough had mostly been dry and at times with clear phlegm initially. )        Anesthesia Quick Evaluation

## 2022-01-24 NOTE — Assessment & Plan Note (Signed)
Continue pain control with oral analgesics

## 2022-01-24 NOTE — Hospital Course (Signed)
[name] was admitted to the hospital with the working diagnosis of cavitary pneumonia.   81 yo female with the past medical history of Asthma and COPD, chronic hypoxemic respiratory failure, DVT/PE sp IVC filter (removed), GERD and anemia who presented with worsening dyspnea and hemoptysis.  Reported few weeks of worsening dyspnea, as outpatient she has been treated with doxycycline and then levofloxacin with no improvement in her symptoms. Recently her symptoms were complicated with hemoptysis, prompting her to come to the hospital. On her initial physical examination her 02 saturation was 87% on room air, RR 20, blood pressure 127/84, and temp 98. Lungs with no wheezing or rales, heart with S1 and S2 present and rhythmic, abdomen with no distention and no lower extremity edema.   Na 140, K 3,9 Cl 103 bicarbonate 29 glucose 106 bun 23 and cr 1,12  High sensitive troponin 8 and 8  Wbc 12.1 hgb 15,3 plt 362  Sars covid 19 negative   Chest radiograph with hyperinflation, increased lung markings at the lower lobes with round lesion right lower lobe with a fluid level CT chest with centrilobular emphysema with right lower lobe cavitary lesion with air fluid inside. Right lower lobe fibrotic changes, honeycombing and interlobular septal thickening. (Personal interpretation) No pulmonary embolism.   EKG 97 bpm, normal axis, normal intervals, sinus rhythm with no significant ST segment or T wave changes.   Patient was placed on broad spectrum antibiotic therapy and supplemental 02 per [place]. 08/29 diagnostic bronchoscopy, right main stem bronchus with blood clot, positive bronchitic changes, blood was present in the superior segment of the right lower lobe. BAL, cultures and cytology sent.

## 2022-01-24 NOTE — Assessment & Plan Note (Signed)
Continue with statin therapy.  ?

## 2022-01-24 NOTE — Progress Notes (Signed)
Progress Note   Patient: [name] ZPH:[id number] DOB: 1941/01/07 DOA: 01/23/2022     1 DOS: the patient was seen and examined on 01/24/2022   Brief hospital course: [name] was admitted to the hospital with the working diagnosis of cavitary pneumonia.   81 yo female with the past medical history of Asthma and COPD, chronic hypoxemic respiratory failure, DVT/PE sp IVC filter (removed), GERD and anemia who presented with worsening dyspnea and hemoptysis.  Reported few weeks of worsening dyspnea, as outpatient she has been treated with doxycycline and then levofloxacin with no improvement in her symptoms. Recently her symptoms were complicated with hemoptysis, prompting her to come to the hospital. On her initial physical examination her 02 saturation was 87% on room air, RR 20, blood pressure 127/84, and temp 98. Lungs with no wheezing or rales, heart with S1 and S2 present and rhythmic, abdomen with no distention and no lower extremity edema.   Na 140, K 3,9 Cl 103 bicarbonate 29 glucose 106 bun 23 and cr 1,12  High sensitive troponin 8 and 8  Wbc 12.1 hgb 15,3 plt 362  Sars covid 19 negative   Chest radiograph with hyperinflation, increased lung markings at the lower lobes with round lesion right lower lobe with a fluid level CT chest with centrilobular emphysema with right lower lobe cavitary lesion with air fluid inside. Right lower lobe fibrotic changes, honeycombing and interlobular septal thickening. (Personal interpretation) No pulmonary embolism.   EKG 97 bpm, normal axis, normal intervals, sinus rhythm with no significant ST segment or T wave changes.   Patient was placed on broad spectrum antibiotic therapy and supplemental 02 per [place]. 08/29 diagnostic bronchoscopy, right main stem bronchus with blood clot, positive bronchitic changes, blood was present in the superior segment of the right lower lobe. BAL, cultures and cytology sent.   Assessment and Plan: * Cavitary  pneumonia Right lower lobe community acquired pneumonia, complicated with pulmonary abscess at the superior segment of the right lower lobe.   08/29 bronchoscopy with positive blood clot in the right main bronchus and superior segment of right lower lobe.  Plan to continue antibiotic therapy with cefepime and clindamycin.  Follow up on bronch cultures and gram stain. Bronchodilator therapy.  Further work up with swallow evaluation.  Acute on chronic hypoxemic respiratory failure. 02 saturation this am is 93% on 2,5 L min per [place] Continue close oxymetry monitoring.    Chronic pulmonary embolism (HCC) History of DVT 12/2020 left lower extremity US within  common femoral, femoral and popliteal vein thrombosis. Subacute to chronic in appearence.   Follow up chest CT with no pulmonary embolism.   Plan to hold anticoagulation for now, until hemoptysis resolved.   COPD mixed type [facility]) Patient with no increase sputum production or wheezing No clinical signs of severe exacerbation. Will hold on systemic corticosteroids in the setting of pulmonary abscess. Continue bronchodilator therapy.   Esophageal reflux Continue with proton pump inhibitors.   Dyslipidemia Continue with statin therapy.   Sciatica Continue pain control with oral analgesics  Essential hypertension Continue blood pressure control with triamterene and hctz.  Blood pressure has been stable with systolic 801 to 655 mmHg.   Anxiety and depression Continue with lorazepam at night.         Subjective: Patient is feeling better, but continue to have hemoptysis, no chest pain, no nausea or vomiting   Physical Exam: Vitals:   01/24/22 0854 01/24/22 1020 01/24/22 1035 01/24/22 1050  BP: (!) 151/85 Marland Kitchen)  146/93 136/84 129/88  Pulse: 89 92 96 84  Resp: _0 Temp: (!) 97 F (36.1 C) (!) 97.5 F (36.4 C)  97.7 F (36.5 C)  TempSrc: Temporal     SpO2: 94% 92% 94% 93%  Weight:      Height:        Neurology awake and alert ENT with no pallor Cardiovascular with S1 and S2 present and rhythmic with no gallops Respiratory with prolonged expiratory phase, with bilateral raled more right than left (at bases) no wheezing, scattered rhonchi Abdomen not distended No lower extremity edema  Data Reviewed:    Family Communication: I spoke with patient's family at the bedside, we talked in detail about patient's condition, plan of care and prognosis and all questions were addressed.   Disposition: Status is: Inpatient Remains inpatient appropriate because: hemoptysis   Planned Discharge Destination: Home     Author: Tawni Millers, MD 01/24/2022 1:22 PM  For on call review [email or web address].

## 2022-01-24 NOTE — Assessment & Plan Note (Addendum)
Patient with no increase sputum production or wheezing No clinical signs of severe exacerbation. Will hold on systemic corticosteroids in the setting of pulmonary abscess. Continue with inhaled steroids.  Continue bronchodilator therapy.

## 2022-01-24 NOTE — Evaluation (Signed)
Clinical/Bedside Swallow Evaluation Patient Details  Name: [name] MRN: [id number] Date of Birth: Dec 05, 1940  Today's Date: 01/24/2022 Time: SLP Start Time (ACUTE ONLY): 14 SLP Stop Time (ACUTE ONLY): 1450 SLP Time Calculation (min) (ACUTE ONLY): 16 min  Past Medical History:  Past Medical History:  Diagnosis Date   Allergic rhinitis    Anemia due to GI blood loss    Anxiety    Asthma    Chronic airway obstruction, not elsewhere classified    Depressive disorder, not elsewhere classified    DVT (deep venous thrombosis) ([place])    LLE DVT 01/13/19   Dyspnea    Esophageal reflux    Essential hypertension 08/27/2019   PE (pulmonary thromboembolism) ([place]) 01/29/2019   Pneumonia    Wears partial dentures    Past Surgical History:  Past Surgical History:  Procedure Laterality Date   ABDOMINAL HYSTERECTOMY     ABDOMINAL HYSTERECTOMY     CATARACT EXTRACTION W/ INTRAOCULAR LENS  IMPLANT, BILATERAL     CHOLECYSTECTOMY N/A 08/12/2021   Procedure: LAPAROSCOPIC CHOLECYSTECTOMY;  Surgeon: Felicie Morn, MD;  Location: [facility];  Service: General;  Laterality: N/A;   COLONOSCOPY     COLONOSCOPY WITH PROPOFOL N/A 08/20/2019   Procedure: COLONOSCOPY WITH PROPOFOL;  Surgeon: Ronald Lobo, MD;  Location: [facility];  Service: Endoscopy;  Laterality: N/A;   CYSTOSCOPY     DILATION AND CURETTAGE OF UTERUS     ECTROPION REPAIR Bilateral 05/12/2021   Procedure: REPAIR OF ECTROPION BY LATERAL TARSAL STRIP OF BILATERAL LOWER EYE LIDS;  Surgeon: Delia Chimes, MD;  Location: [facility];  Service: Ophthalmology;  Laterality: Bilateral;   ESOPHAGOGASTRODUODENOSCOPY (EGD) WITH PROPOFOL N/A 08/20/2019   Procedure: ESOPHAGOGASTRODUODENOSCOPY (EGD) WITH PROPOFOL;  Surgeon: Ronald Lobo, MD;  Location: [place];  Service: Endoscopy;  Laterality: N/A;   HEMOSTASIS CONTROL  08/20/2019   Procedure: HEMOSTASIS CONTROL;  Surgeon: Ronald Lobo, MD;  Location: [facility];  Service:  Endoscopy;;   INTRAVASCULAR ULTRASOUND/IVUS Left 05/26/2019   Procedure: Intravascular Ultrasound/IVUS;  Surgeon: Waynetta Sandy, MD;  Location: [facility];  Service: Cardiovascular;  Laterality: Left;   IVC FILTER REMOVAL N/A 03/24/2019   Procedure: IVC FILTER REMOVAL;  Surgeon: Waynetta Sandy, MD;  Location: [place] [facility];  Service: Cardiovascular;  Laterality: N/A;   LACRIMAL TUBE INSERTION Right 05/12/2021   Procedure: RIGHT NASOLACRIMAL DUCT PROBE WITH MINI MONOKA STENT PLACEMENT;  Surgeon: Delia Chimes, MD;  Location: [place];  Service: Ophthalmology;  Laterality: Right;   LESION EXCISION WITH COMPLEX REPAIR N/A 05/12/2021   Procedure: NASAL LESION EXCISION;  Surgeon: Delia Chimes, MD;  Location: [place];  Service: Ophthalmology;  Laterality: N/A;   LOWER EXTREMITY VENOGRAPHY Left 05/26/2019   Procedure: LOWER EXTREMITY VENOGRAPHY;  Surgeon: Waynetta Sandy, MD;  Location: [facility];  Service: Cardiovascular;  Laterality: Left;   MULTIPLE TOOTH EXTRACTIONS     NASAL SINUS SURGERY     PERIPHERAL VASCULAR INTERVENTION Left 05/26/2019   Procedure: PERIPHERAL VASCULAR INTERVENTION;  Surgeon: Waynetta Sandy, MD;  Location: [facility] [facility];  Service: Cardiovascular;  Laterality: Left;  lower extremity veins   PERIPHERAL VASCULAR THROMBECTOMY  05/26/2019   Procedure: PERIPHERAL VASCULAR THROMBECTOMY;  Surgeon: Waynetta Sandy, MD;  Location: [facility] [place] [facility];  Service: Cardiovascular;;   PTOSIS REPAIR Bilateral 05/12/2021   Procedure: EXTERNAL PTOSIS REPAIR LEVATOR ADVANCEDMENT/RESECTION OF BILATERAL UPPER EYE LIDS;  Surgeon: Delia Chimes, MD;  Location: [place];  Service: Ophthalmology;  Laterality:  Bilateral;   TUBAL LIGATION     VENA CAVA FILTER PLACEMENT N/A 01/13/2019   Procedure: INSERTION VENA-CAVA FILTER;  Surgeon: Waynetta Sandy, MD;  Location: [place];  Service: Vascular;  Laterality: N/A;    HPI:  Pt is a 81 yo female who presented 8/28 with worsening dyspnea and hemoptysis, admitted with RLL cavitary PNA. PMH: PNA (multiple times per pt), asthma, COPD, chronic hypoxemic respiratory failure, DVT/PE sp IVC filter (removed), GERD and anemia    Assessment / Plan / Recommendation  Clinical Impression  Pt's oropharyngeal swallow appears to be functional with no overt s/s of aspiration observed across consistencies. Silent aspiration cannot be excluded given her hx; however, her subjective symptoms appear to be more esophageal in nature, including: feeling like solids won't go down, coughing after eating solid foods, and regurgitation if she eats too much at once. She says that she has been having a lot more regurgitation lately, feeling like food is coming back up into her throat. Question if she may benefit from a more dedicated assessment of the esophagus. SLP will f/u to see if any further testing of oropharyngeal swallowing is also necessary. SLP Visit Diagnosis: Dysphagia, unspecified (R13.10)    Aspiration Risk  Mild aspiration risk;Moderate aspiration risk    Diet Recommendation Regular;Thin liquid   Liquid Administration via: Cup;Straw Medication Administration: Whole meds with liquid Supervision: Patient able to self feed;Intermittent supervision to cue for compensatory strategies Compensations: Slow rate;Small sips/bites;Follow solids with liquid (esophageal precautions reviewed) Postural Changes: Seated upright at 90 degrees;Remain upright for at least 30 minutes after po intake    Other  Recommendations Recommended Consults: Consider esophageal assessment Oral Care Recommendations: Oral care BID    Recommendations for follow up therapy are one component of a multi-disciplinary discharge planning process, led by the attending physician.  Recommendations may be updated based on patient status, additional functional criteria and insurance authorization.  Follow up  Recommendations  (tba)      Assistance Recommended at Discharge PRN  Functional Status Assessment Patient has had a recent decline in their functional status and demonstrates the ability to make significant improvements in function in a reasonable and predictable amount of time.  Frequency and Duration min 2x/week  2 weeks       Prognosis Prognosis for Safe Diet Advancement: Good      Swallow Study   General HPI: Pt is a 81 yo female who presented 8/28 with worsening dyspnea and hemoptysis, admitted with RLL cavitary PNA. PMH: PNA (multiple times per pt), asthma, COPD, chronic hypoxemic respiratory failure, DVT/PE sp IVC filter (removed), GERD and anemia Type of Study: Bedside Swallow Evaluation Previous Swallow Assessment: none chart Diet Prior to this Study: Regular;Thin liquids Temperature Spikes Noted: No Respiratory Status: Nasal cannula History of Recent Intubation: No Behavior/Cognition: Alert;Cooperative;Pleasant mood Oral Cavity Assessment: Within Functional Limits Oral Care Completed by SLP: No Oral Cavity - Dentition: Dentures, top;Dentures, bottom (partials top/bottom) Vision: Functional for self-feeding Self-Feeding Abilities: Able to feed self Patient Positioning: Upright in bed Baseline Vocal Quality: Hoarse Volitional Cough: Strong Volitional Swallow: Able to elicit    Oral/Motor/Sensory Function Overall Oral Motor/Sensory Function: Within functional limits   Ice Chips Ice chips: Not tested   Thin Liquid Thin Liquid: Within functional limits Presentation: Cup;Self Fed;Straw    Nectar Thick Nectar Thick Liquid: Not tested   Honey Thick Honey Thick Liquid: Not tested   Puree Puree: Within functional limits Presentation: Self Fed;Spoon   Solid     Solid: Within  functional limits Presentation: Self Fed      Osie Bond., M.A. [place] Office [phone]  Secure chat preferred  01/24/2022,2:59 PM

## 2022-01-24 NOTE — Assessment & Plan Note (Addendum)
History of DVT 12/2020 left lower extremity US within  common femoral, femoral and popliteal vein thrombosis. Subacute to chronic in appearence.   Follow up chest CT with no pulmonary embolism.   Plan to hold anticoagulation for now, until hemoptysis resolved.

## 2022-01-24 NOTE — Assessment & Plan Note (Addendum)
Continue blood pressure control with triamterene and hctz.  Blood pressure has been stable with systolic 943 to 200 mmHg.  Discontinue aspirin.

## 2022-01-25 ENCOUNTER — Telehealth: Payer: Self-pay | Admitting: Pulmonary Disease

## 2022-01-25 ENCOUNTER — Inpatient Hospital Stay (HOSPITAL_COMMUNITY): Payer: PPO

## 2022-01-25 DIAGNOSIS — I2782 Chronic pulmonary embolism: Secondary | ICD-10-CM | POA: Diagnosis not present

## 2022-01-25 DIAGNOSIS — J984 Other disorders of lung: Secondary | ICD-10-CM | POA: Diagnosis not present

## 2022-01-25 DIAGNOSIS — J189 Pneumonia, unspecified organism: Secondary | ICD-10-CM | POA: Diagnosis not present

## 2022-01-25 DIAGNOSIS — F419 Anxiety disorder, unspecified: Secondary | ICD-10-CM | POA: Diagnosis not present

## 2022-01-25 DIAGNOSIS — E785 Hyperlipidemia, unspecified: Secondary | ICD-10-CM | POA: Diagnosis not present

## 2022-01-25 LAB — CBC
HCT: 43.5 % (ref 36.0–46.0)
Hemoglobin: 14.3 g/dL (ref 12.0–15.0)
MCH: 30.2 pg (ref 26.0–34.0)
MCHC: 32.9 g/dL (ref 30.0–36.0)
MCV: 92 fL (ref 80.0–100.0)
Platelets: 404 10*3/uL — ABNORMAL HIGH (ref 150–400)
RBC: 4.73 MIL/uL (ref 3.87–5.11)
RDW: 14.1 % (ref 11.5–15.5)
WBC: 14.9 10*3/uL — ABNORMAL HIGH (ref 4.0–10.5)
nRBC: 0 % (ref 0.0–0.2)

## 2022-01-25 LAB — BASIC METABOLIC PANEL
Anion gap: 11 (ref 5–15)
BUN: 20 mg/dL (ref 8–23)
CO2: 27 mmol/L (ref 22–32)
Calcium: 9.4 mg/dL (ref 8.9–10.3)
Chloride: 101 mmol/L (ref 98–111)
Creatinine, Ser: 1 mg/dL (ref 0.44–1.00)
GFR, Estimated: 58 mL/min — ABNORMAL LOW (ref >60)
Glucose, Bld: 87 mg/dL (ref 70–99)
Potassium: 3.6 mmol/L (ref 3.5–5.1)
Sodium: 139 mmol/L (ref 135–145)

## 2022-01-25 MED ORDER — IPRATROPIUM-ALBUTEROL 0.5-2.5 (3) MG/3ML IN SOLN
3.0000 mL | Freq: Three times a day (TID) | RESPIRATORY_TRACT | Status: DC
  Administered 2022-01-25 – 2022-01-26 (×4): 3 mL via RESPIRATORY_TRACT
  Filled 2022-01-25 (×4): qty 3

## 2022-01-25 MED ORDER — APIXABAN 5 MG PO TABS
5.0000 mg | ORAL_TABLET | Freq: Two times a day (BID) | ORAL | Status: DC
  Administered 2022-01-25 – 2022-01-26 (×2): 5 mg via ORAL
  Filled 2022-01-25 (×2): qty 1

## 2022-01-25 NOTE — Telephone Encounter (Signed)
Please arrange hospital f/u in 3-4 weeks with [name] or an APP; reason pulmonary abscess

## 2022-01-25 NOTE — Anesthesia Postprocedure Evaluation (Signed)
Anesthesia Post Note  Patient: [name]  Procedure(s) Performed: VIDEO BRONCHOSCOPY WITHOUT FLUORO BRONCHIAL WASHINGS     Patient location during evaluation: PACU Anesthesia Type: General Level of consciousness: awake and alert Pain management: pain level controlled Vital Signs Assessment: post-procedure vital signs reviewed and stable Respiratory status: spontaneous breathing, nonlabored ventilation, respiratory function stable and patient connected to nasal cannula oxygen Cardiovascular status: blood pressure returned to baseline and stable Postop Assessment: no apparent nausea or vomiting Anesthetic complications: no   No notable events documented.  Last Vitals:  Vitals:   01/25/22 0142 01/25/22 0510  BP: 121/83 112/69  Pulse: 77 81  Resp: 16 16  Temp:  36.5 C  SpO2: 95% 96%    Last Pain:  Vitals:   01/25/22 0510  TempSrc: Oral  PainSc: 0-No pain                 Fleta Borgeson

## 2022-01-25 NOTE — Progress Notes (Signed)
PROGRESS NOTE        PATIENT DETAILS Name: [name] Age: 81 y.o. Sex: female Date of Birth: May 22, 1941 Admit Date: 01/23/2022 Admitting Physician Guilford Shi, MD VZD:GLOVFIE, [name], [name]  Brief Summary: Patient is a 81 y.o.  female with history of VTE, COPD-with chronic hypoxic respiratory failure on home nocturnal O2-who presented to the hospital with worsening cough/hemoptysis-found to have lung abscess and subsequently admitted to the hospitalist service.  Significant events: 8/28>> worsening hemoptysis/dyspnea-found to have lung abscess.  Significant studies: 8/28>> CTA chest: New large alveolar infiltrate in the right lower lobe with cavitary lesion.  No PE. 8/30>> barium esophagogram: No aspiration observed-no strictures/masses.  Significant microbiology data: 8/29>> BAL culture: Pending 8/29>> BAL AFB smear: Pending 8/29>> BAL AFB/fungal culture: Pending  Pathology: 8/29>> BAL cytology: Pending  Procedures: 8/29>> bronchoscopy  Consults: None  Subjective: No hemoptysis.  Lying comfortably in bed.  Objective: Vitals: Blood pressure 121/75, pulse 91, temperature 97.9 F (36.6 C), temperature source Oral, resp. rate 18, height _0  (1.676 m), weight 76.2 kg, SpO2 92 %.   Exam: Gen Exam:Alert awake-not in any distress HEENT:atraumatic, normocephalic Chest: B/L clear to auscultation anteriorly CVS:S1S2 regular Abdomen:soft non tender, non distended Extremities:no edema Neurology: Non focal Skin: no rash  Pertinent Labs/Radiology:    Latest Ref Rng & Units 01/25/2022    1:19 PM 01/24/2022    2:37 PM 01/23/2022   10:03 AM  CBC  WBC 4.0 - 10.5 K/uL 14.9  13.9  12.1   Hemoglobin 12.0 - 15.0 g/dL 14.3  14.4  15.3   Hematocrit 36.0 - 46.0 % 43.5  43.9  47.2   Platelets 150 - 400 K/uL 404  346  362     Lab Results  Component Value Date   NA 139 01/25/2022   K 3.6 01/25/2022   CL 101 01/25/2022   CO2 27 01/25/2022      Assessment/Plan: Lung abscess: No further hemoptysis-continue empiric antibiotics- will touch base with ID regarding discharge antibiotic regimen (has been previously treated with Levaquin/azithromycin and doxycycline).  Awaiting BAL cultures.  Discussed with Dr. Darryl Nestle to be restarted today since no further episodes of hemoptysis.   VTE: Eliquis to be resumed today since no further hemoptysis.  COPD: Not in exacerbation-continue bronchodilators.  GERD: Continue PPI  HLD: Continue statin  HTN: BP stable-continue triamterene/HCTZ  Anxiety with depression: Stable-continue trazodone/lorazepam.  4 cm ascending thoracic aneurysm: Radiology recommending annual CTA//MRA.  BMI Estimated body mass index is 27.12 kg/m as calculated from the following:   Height as of this encounter: _1  (1.676 m).   Weight as of this encounter: 76.2 kg.   Code status:   Code Status: Full Code   DVT Prophylaxis: Place and maintain sequential compression device Start: 01/24/22 1540   Family Communication: None at bedside   Disposition Plan: Status is: Inpatient Remains inpatient appropriate because: No further hemoptysis-rechallenging with Eliquis to see if hemoptysis reoccurs.  Home on 8/31 remains on IV antibiotics.   Planned Discharge Destination:Home likely 8/31   Diet: Diet Order             Diet regular Room service appropriate? Yes; Fluid consistency: Thin  Diet effective now                     Antimicrobial agents: Anti-infectives (From admission, onward)  Start     Dose/Rate Route Frequency Ordered Stop   01/24/22 1345  vancomycin (VANCOREADY) IVPB 1250 mg/250 mL  Status:  Discontinued        1,250 mg 166.7 mL/hr over 90 Minutes Intravenous Every 24 hours 01/23/22 1326 01/23/22 1500   01/23/22 1530  clindamycin (CLEOCIN) capsule 300 mg       Note to Pharmacy: Concern for aspiration pneumonia/anaerobe.   300 mg Oral Every 8 hours 01/23/22 1434      01/23/22 1345  vancomycin (VANCOREADY) IVPB 1500 mg/300 mL  Status:  Discontinued        1,500 mg 150 mL/hr over 120 Minutes Intravenous  Once 01/23/22 1323 01/23/22 1615   01/23/22 1330  ceFEPIme (MAXIPIME) 2 g in sodium chloride 0.9 % 100 mL IVPB        2 g 200 mL/hr over 30 Minutes Intravenous Every 12 hours 01/23/22 1323     01/23/22 1145  cefTRIAXone (ROCEPHIN) 1 g in sodium chloride 0.9 % 100 mL IVPB        1 g 200 mL/hr over 30 Minutes Intravenous  Once 01/23/22 1131 01/23/22 1223        MEDICATIONS: Scheduled Meds:  clindamycin  300 mg Oral Q8H   ipratropium-albuterol  3 mL Nebulization TID   LORazepam  1 mg Oral QHS   mometasone-formoterol  2 puff Inhalation BID   pantoprazole  40 mg Oral Daily   traZODone  100 mg Oral QHS   triamterene-hydrochlorothiazide  0.5 tablet Oral Daily   Continuous Infusions:  ceFEPime (MAXIPIME) IV 2 g (01/25/22 1007)   PRN Meds:.acetaminophen, ipratropium-albuterol   I have personally reviewed following labs and imaging studies  LABORATORY DATA: CBC: Recent Labs  Lab 01/23/22 1003 01/24/22 1437 01/25/22 1319  WBC 12.1* 13.9* 14.9*  NEUTROABS 9.5*  --   --   HGB 15.3* 14.4 14.3  HCT 47.2* 43.9 43.5  MCV 94.0 93.6 92.0  PLT 362 346 404*    Basic Metabolic Panel: Recent Labs  Lab 01/23/22 0826 01/25/22 1319  NA 140 139  K 3.9 3.6  CL 103 101  CO2 29 27  GLUCOSE 106* 87  BUN 23 20  CREATININE 1.12* 1.00  CALCIUM 9.1 9.4    GFR: Estimated Creatinine Clearance: 48.4 mL/min (by C-G formula based on SCr of 1 mg/dL).  Liver Function Tests: No results for input(s): "AST", "ALT", "ALKPHOS", "BILITOT", "PROT", "ALBUMIN" in the last 168 hours. No results for input(s): "LIPASE", "AMYLASE" in the last 168 hours. No results for input(s): "AMMONIA" in the last 168 hours.  Coagulation Profile: No results for input(s): "INR", "PROTIME" in the last 168 hours.  Cardiac Enzymes: No results for input(s): "CKTOTAL", "CKMB",  "CKMBINDEX", "TROPONINI" in the last 168 hours.  BNP (last 3 results) No results for input(s): "PROBNP" in the last 8760 hours.  Lipid Profile: No results for input(s): "CHOL", "HDL", "LDLCALC", "TRIG", "CHOLHDL", "LDLDIRECT" in the last 72 hours.  Thyroid Function Tests: No results for input(s): "TSH", "T4TOTAL", "FREET4", "T3FREE", "THYROIDAB" in the last 72 hours.  Anemia Panel: No results for input(s): "VITAMINB12", "FOLATE", "FERRITIN", "TIBC", "IRON", "RETICCTPCT" in the last 72 hours.  Urine analysis:    Component Value Date/Time   COLORURINE YELLOW 11/22/2019 0539   APPEARANCEUR CLOUDY (A) 11/22/2019 0539   LABSPEC >1.030 (H) 11/22/2019 0539   PHURINE 5.5 11/22/2019 0539   GLUCOSEU NEGATIVE 11/22/2019 0539   HGBUR TRACE (A) 11/22/2019 0539   BILIRUBINUR SMALL (A) 11/22/2019 0539   KETONESUR NEGATIVE  11/22/2019 0539   PROTEINUR 30 (A) 11/22/2019 0539   NITRITE NEGATIVE 11/22/2019 0539   LEUKOCYTESUR SMALL (A) 11/22/2019 0539    Sepsis Labs: Lactic Acid, Venous No results found for: "LATICACIDVEN"  MICROBIOLOGY: Recent Results (from the past 240 hour(s))  Resp Panel by RT-PCR (Flu A&B, Covid) Anterior Nasal Swab     Status: None   Collection Time: 01/23/22 12:23 PM   Specimen: Anterior Nasal Swab  Result Value Ref Range Status   SARS Coronavirus 2 by RT PCR NEGATIVE NEGATIVE Final    Comment: (NOTE) SARS-CoV-2 target nucleic acids are NOT DETECTED.  The SARS-CoV-2 RNA is generally detectable in upper respiratory specimens during the acute phase of infection. The lowest concentration of SARS-CoV-2 viral copies this assay can detect is 138 copies/mL. A negative result does not preclude SARS-Cov-2 infection and should not be used as the sole basis for treatment or other patient management decisions. A negative result may occur with  improper specimen collection/handling, submission of specimen other than nasopharyngeal swab, presence of viral mutation(s) within  the areas targeted by this assay, and inadequate number of viral copies(<138 copies/mL). A negative result must be combined with clinical observations, patient history, and epidemiological information. The expected result is Negative.  Fact Sheet for Patients:  EntrepreneurPulse.com.au  Fact Sheet for Healthcare Providers:  IncredibleEmployment.be  This test is no t yet approved or cleared by the [place] FDA and  has been authorized for detection and/or diagnosis of SARS-CoV-2 by FDA under an Emergency Use Authorization (EUA). This EUA will remain  in effect (meaning this test can be used) for the duration of the COVID-19 declaration under Section 564(b)(1) of the Act, 21 U.S.C.section 360bbb-3(b)(1), unless the authorization is terminated  or revoked sooner.       Influenza A by PCR NEGATIVE NEGATIVE Final   Influenza B by PCR NEGATIVE NEGATIVE Final    Comment: (NOTE) The Xpert Xpress SARS-CoV-2/FLU/RSV plus assay is intended as an aid in the diagnosis of influenza from Nasopharyngeal swab specimens and should not be used as a sole basis for treatment. Nasal washings and aspirates are unacceptable for Xpert Xpress SARS-CoV-2/FLU/RSV testing.  Fact Sheet for Patients: EntrepreneurPulse.com.au  Fact Sheet for Healthcare Providers: IncredibleEmployment.be  This test is not yet approved or cleared by the [place] FDA and has been authorized for detection and/or diagnosis of SARS-CoV-2 by FDA under an Emergency Use Authorization (EUA). This EUA will remain in effect (meaning this test can be used) for the duration of the COVID-19 declaration under Section 564(b)(1) of the Act, 21 U.S.C. section 360bbb-3(b)(1), unless the authorization is terminated or revoked.  Performed at [facility], [place] [address], [place], [place] [address]   Culture, blood (Routine X 2) w Reflex to ID Panel      Status: None (Preliminary result)   Collection Time: 01/23/22  4:10 PM   Specimen: BLOOD  Result Value Ref Range Status   Specimen Description BLOOD BLOOD LEFT HAND  Final   Special Requests   Final    BOTTLES DRAWN AEROBIC ONLY Blood Culture results may not be optimal due to an inadequate volume of blood received in culture bottles   Culture   Final    NO GROWTH 2 DAYS Performed at [facility], [place] [address] [place], [place] [address]    Report Status PENDING  Incomplete  Culture, blood (Routine X 2) w Reflex to ID Panel     Status: None (Preliminary result)  Collection Time: 01/23/22  4:21 PM   Specimen: BLOOD  Result Value Ref Range Status   Specimen Description BLOOD SITE NOT SPECIFIED  Final   Special Requests   Final    BOTTLES DRAWN AEROBIC AND ANAEROBIC Blood Culture adequate volume   Culture   Final    NO GROWTH 2 DAYS Performed at [facility], [address], [place], [place] [address]    Report Status PENDING  Incomplete  MRSA Next Gen by PCR, Nasal     Status: None   Collection Time: 01/24/22  2:54 AM   Specimen: Nasal Mucosa; Nasal Swab  Result Value Ref Range Status   MRSA by PCR Next Gen NOT DETECTED NOT DETECTED Final    Comment: (NOTE) The GeneXpert MRSA Assay (FDA approved for NASAL specimens only), is one component of a comprehensive MRSA colonization surveillance program. It is not intended to diagnose MRSA infection nor to guide or monitor treatment for MRSA infections. Test performance is not FDA approved in patients less than 106 years old. Performed at [facility], [place] [address], [place], [place] [address]   Culture, Respiratory w Gram Stain     Status: None (Preliminary result)   Collection Time: 01/24/22 10:06 AM   Specimen: Bronchial Alveolar Lavage; Respiratory  Result Value Ref Range Status   Specimen Description BRONCHIAL ALVEOLAR LAVAGE  Final   Special Requests  RIGHT LOWER LOBE  Final   Gram Stain NO WBC SEEN NO  ORGANISMS SEEN   Final   Culture   Final    NO GROWTH 1 DAY Performed at [facility], [address] [place], [place] [address]    Report Status PENDING  Incomplete  Anaerobic culture w Gram Stain     Status: None (Preliminary result)   Collection Time: 01/24/22 10:06 AM   Specimen: Bronchial Alveolar Lavage; Respiratory  Result Value Ref Range Status   Specimen Description BRONCHIAL ALVEOLAR LAVAGE  Final   Special Requests NONE  Final   Gram Stain   Final    NO ORGANISMS SEEN NO WBC SEEN Performed at [facility], [address] [place], [place] [address]    Culture PENDING  Incomplete   Report Status PENDING  Incomplete    RADIOLOGY STUDIES/RESULTS: DG ESOPHAGUS W SINGLE CM (SOL OR THIN BA)  Result Date: 01/25/2022 CLINICAL DATA:  Patient with a history of dysphagia. Patient complains of feeling like solids do not go down, coughing after eating solid foods, and regurgitation if she eats too much at once. Barium study requested. EXAM: ESOPHAGUS/BARIUM SWALLOW/TABLET STUDY TECHNIQUE: Single contrast examination was performed using thin liquid barium. This exam was performed by Soyla Dryer, NP, and was supervised and interpreted by Dr. Maree Erie. FLUOROSCOPY: Radiation Exposure Index (as provided by the fluoroscopic device): 21.10 mGy Kerma COMPARISON:  None Available. FINDINGS: Swallowing: Appears normal. No vestibular penetration or aspiration seen. Pharynx: Unremarkable. Esophagus: Normal appearance. Esophageal motility: Poor primary peristalsis with tertiary contractions observed Hiatal Hernia: Small-moderate sized hiatal hernia observed Gastroesophageal reflux: None visualized. Ingested 14m barium tablet: Passed into the hiatal hernia Other: None. IMPRESSION: No aspiration observed. No stricture or mass seen in the esophagus. Poor primary peristalsis with tertiary contractions. Small to moderate-sized hiatal hernia present. 13 mm barium tablet passed easily into the  hiatal hernia. Patient tolerated procedure well with no coughing or other complaints. Read by: JSoyla Dryer NP Electronically Signed   By: MNelson ChimesM.D.   On: 01/25/2022 11:35  LOS: 2 days   Oren Binet, MD  [facility]    To contact the attending provider between 7A-7P or the covering provider during after hours 7P-7A, please log into the web site [email or web address] and access using universal [place] password for that web site. If you do not have the password, please call the hospital operator.  01/25/2022, 2:58 PM

## 2022-01-25 NOTE — Progress Notes (Signed)
NAME:  [name], MRN:  [id number], DOB:  11/29/1940, LOS: 2 ADMISSION DATE:  01/23/2022, CONSULTATION DATE:  01/23/22 REFERRING MD:  Sloan Leiter, CHIEF COMPLAINT:  coughing blood  History of Present Illness:  [name] is a 81 yo F with a PMHx of DVT/PE, previously with an IVC filter s/p removal, recently back on Eliquis for L arm clot 11/2021, compliant, COPD and centrilobular emphysema, with intermittent nighlty O2 use, presenting to the [facility] with recurrent cough, subacute hemoptysis, and shortness of breath   Patient was in her usual state of health until June 2023 when she reported a worsening cough requiring her to present to the hospital where she was treated for a COPD exacerbation with Levaquin and a steroid taper. Patient denies resolution of cough or symptomatic improvement, though she was nor producing sputum. Patient was  followed by [name] in 12/08/2021 when he was given a second abx course of Doxycycline.    2 weeks ago, patient presented to UC for worsening cough, where she was prescribed a azithromycin, which she completed. She reports that the cough has not gotten worse, but that is continues to be persistent. It is worse when she eats, as she feels like food gets stuck in the back of her throat and sometimes goes "down the wrong pipe". She has a history of GERD and reports takes PPIs, which usually help with burning sensation.    She first noted streaks of blood on Saturday and it did not worsen until this morning.  Prior to this, she denies yellow or green sputum. Since then, she reports she's been coughing up ~1-2 teaspoons of blood. Patient denies over shortness of breath or needing to increase her O2 use at home. She also endorses R sided chest pain when she lays on her R side, but that there is no pain when she breaths.   Patient now s/p BAL, on clindamycin and cefepime, stable.  Pertinent  Medical History  DVT PE COPD HTN HLD GERD with hx of 3cm hiatal hernia  noted on EGD 2021  Significant Hospital Events: Including procedures, antibiotic start and stop dates in addition to other pertinent events   11/16/2021 Chest XR:  No acute infiltrate or effusion 11/17/2021 CT Angio chest PE: no PE, centrilobular emphysema, subsegmental atelectasis on RML and lingula. Hiatal hernia 12/12/2021 CT Angio PE: stable 4.1 cm ascending aortic aneurysm. Stable centrilobular emphysema with minimal subsegmental atelectasis in the RML. No airspace disease or effusion. NO PE.  01/23/22: RLL air space disease, worse in the superior segment. 01/23/22 CT Angio w contrast: New alveolar infiltrate in RLL likely pneumonia. Cavitary lesion with air-fluid level in superior segment of RLL, concerning for abscess. No PE. Ectasia of main pulmonary artery. Stable 4cm ascending thoracic aneurysm.  01/24/22 BAL with Dr. Lake Bells, sent for cultures and cytology, pending 01/25/22 SLP evaluation: functional oropharyngeal swallow, not able to exclude silent aspiration with esophageal dysfunction suspected given regurgitation. Awaiting barium esophagram.  01/25/2022 BAL cultures and cytology, pending. No WBC seen on gram stain  Interim History / Subjective:  Patient feeling better today. Cough has improved in the last couple of days, with decreased secretions. She has not coughed blood since yesterday night. Decreased sob and no chest pain today. Was evaluated by SLP and is tolerating thin diet.  Objective   Blood pressure 112/69, pulse 67, temperature 97.7 F (36.5 C), temperature source Oral, resp. rate 18, height _0  (1.676 m), weight 76.2 kg, SpO2 92 %.  Intake/Output Summary (Last 24 hours) at 01/25/2022 1015 Last data filed at 01/25/2022 0100 Gross per 24 hour  Intake 660 ml  Output --  Net 660 ml   Filed Weights   01/23/22 0817  Weight: 76.2 kg    Examination: General: Pleasant, well-appearing standing next to bed. No acute distress. CV: RRR. No murmurs, rubs, or gallops.  No LE edema Pulmonary: Patient on 2L [place]. Normal effort. Decreased breath sounds on R lung field compared to left. No wheezing or rales. Abdominal: Soft, nontender, nondistended. Normal bowel sounds. Extremities: Palpable radial and DP pulses. Normal ROM. Skin: Warm and dry. No obvious rash or lesions. Neuro: A&Ox3. Moves all extremities. Normal sensation. No focal deficit. Diffussed ecchymoses on bilateral antecubital upper extremities Psych: Normal mood and affect   Resolved Hospital Problem list     Assessment & Plan:  RLL alveolar infiltrate RUL Cavitary lesion  S/p BAL on 01/24/22 Patient with hx of recurrent pneumonia in the setting of multiple courses of antimicrobial therapy, nonresolved subacute cough and new onset hemoptysis, new RUL cavitary lesion with worsening alveolar infiltrates not seen on prior imaging. S/P BAL on 8/30 cultures and cytology, pending. No WBC seen on gram stain. CBC stable. She has been on Clindamycin and cefepime since admission and would need long term antibiotic regimen while awaiting cultures. Given antibiotic therapy course prior to this admission with levofloxacin, doxycycline, azithromycin, and patient's penicillin allergy, would favor a consult to infectious disease for recommendations.  Patient denies hemoptysis since last night. Last dose of Eliquis on 8/28 for recurrent DVT; recommend restarting Eliquis now to assess patient during this hospitalization.  -continue Clindamycin and Cefepime -consult to ID for long term antibiotic recommendations  -Bronchoscopy with BAL on 8/29; awaiting cultures -Awaiting Barium esophagram for aspiration concerns -Restart Eliquis today  COPD Per primary team. No increased sputum production today, on bronchodilator therapy. No evidence of COPD exacerbation -Continue Albuterol-ipratropium duonebs and Dulera -Outpatient follow up with [name] on 9/29   Best Practice (right click and "Reselect all SmartList  Selections" daily)   Diet/type: dysphagia diet (see orders) DVT prophylaxis: DOAC GI prophylaxis: PPI Lines: N/A Foley:  N/A Code Status:  full code Last date of multidisciplinary goals of care discussion [None]  Labs   CBC: Recent Labs  Lab 01/23/22 1003 01/24/22 1437  WBC 12.1* 13.9*  NEUTROABS 9.5*  --   HGB 15.3* 14.4  HCT 47.2* 43.9  MCV 94.0 93.6  PLT 362 740    Basic Metabolic Panel: Recent Labs  Lab 01/23/22 0826  NA 140  K 3.9  CL 103  CO2 29  GLUCOSE 106*  BUN 23  CREATININE 1.12*  CALCIUM 9.1   GFR: Estimated Creatinine Clearance: 43.2 mL/min (A) (by C-G formula based on SCr of 1.12 mg/dL (H)). Recent Labs  Lab 01/23/22 1003 01/24/22 1437  WBC 12.1* 13.9*    Liver Function Tests: No results for input(s): "AST", "ALT", "ALKPHOS", "BILITOT", "PROT", "ALBUMIN" in the last 168 hours. No results for input(s): "LIPASE", "AMYLASE" in the last 168 hours. No results for input(s): "AMMONIA" in the last 168 hours.  ABG No results found for: "PHART", "PCO2ART", "PO2ART", "HCO3", "TCO2", "ACIDBASEDEF", "O2SAT"   Coagulation Profile: No results for input(s): "INR", "PROTIME" in the last 168 hours.  Cardiac Enzymes: No results for input(s): "CKTOTAL", "CKMB", "CKMBINDEX", "TROPONINI" in the last 168 hours.  HbA1C: Hgb A1c MFr Bld  Date/Time Value Ref Range Status  08/27/2019 05:07 PM 4.9 4.8 - 5.6 % Final  Comment:    (NOTE) Pre diabetes:          5.7%-6.4% Diabetes:              >6.4% Glycemic control for   <7.0% adults with diabetes   05/28/2013 05:45 AM 5.8 (H) <5.7 % Final    Comment:    (NOTE)                                                                       According to the ADA Clinical Practice Recommendations for 2011, when HbA1c is used as a screening test:  >=6.5%   Diagnostic of Diabetes Mellitus           (if abnormal result is confirmed) 5.7-6.4%   Increased risk of developing Diabetes Mellitus References:Diagnosis and  Classification of Diabetes Mellitus,Diabetes OEHO,1224,82(NOIBB 1):S62-S69 and Standards of Medical Care in         Diabetes - 2011,Diabetes CWUG,8916,94 (Suppl 1):S11-S61.    CBG: No results for input(s): "GLUCAP" in the last 168 hours.  Review of Systems:   Per interval history  Past Medical History:  She,  has a past medical history of Allergic rhinitis, Anemia due to GI blood loss, Anxiety, Asthma, Chronic airway obstruction, not elsewhere classified, Depressive disorder, not elsewhere classified, DVT (deep venous thrombosis) ([place]), Dyspnea, Esophageal reflux, Essential hypertension (08/27/2019), PE (pulmonary thromboembolism) ([place]) (01/29/2019), Pneumonia, and Wears partial dentures.   Surgical History:   Past Surgical History:  Procedure Laterality Date   ABDOMINAL HYSTERECTOMY     ABDOMINAL HYSTERECTOMY     CATARACT EXTRACTION W/ INTRAOCULAR LENS  IMPLANT, BILATERAL     CHOLECYSTECTOMY N/A 08/12/2021   Procedure: LAPAROSCOPIC CHOLECYSTECTOMY;  Surgeon: Felicie Morn, MD;  Location: [facility];  Service: General;  Laterality: N/A;   COLONOSCOPY     COLONOSCOPY WITH PROPOFOL N/A 08/20/2019   Procedure: COLONOSCOPY WITH PROPOFOL;  Surgeon: Ronald Lobo, MD;  Location: [place];  Service: Endoscopy;  Laterality: N/A;   CYSTOSCOPY     DILATION AND CURETTAGE OF UTERUS     ECTROPION REPAIR Bilateral 05/12/2021   Procedure: REPAIR OF ECTROPION BY LATERAL TARSAL STRIP OF BILATERAL LOWER EYE LIDS;  Surgeon: Delia Chimes, MD;  Location: [place];  Service: Ophthalmology;  Laterality: Bilateral;   ESOPHAGOGASTRODUODENOSCOPY (EGD) WITH PROPOFOL N/A 08/20/2019   Procedure: ESOPHAGOGASTRODUODENOSCOPY (EGD) WITH PROPOFOL;  Surgeon: Ronald Lobo, MD;  Location: [place];  Service: Endoscopy;  Laterality: N/A;   HEMOSTASIS CONTROL  08/20/2019   Procedure: HEMOSTASIS CONTROL;  Surgeon: Ronald Lobo, MD;  [facility];  Service: Endoscopy;;   INTRAVASCULAR  ULTRASOUND/IVUS Left 05/26/2019   Procedure: Intravascular Ultrasound/IVUS;  Surgeon: Waynetta Sandy, MD;  Location: [place] [facility];  Service: Cardiovascular;  Laterality: Left;   IVC FILTER REMOVAL N/A 03/24/2019   Procedure: IVC FILTER REMOVAL;  Surgeon: Waynetta Sandy, MD;  Location: [place] [facility];  Service: Cardiovascular;  Laterality: N/A;   LACRIMAL TUBE INSERTION Right 05/12/2021   Procedure: RIGHT NASOLACRIMAL DUCT PROBE WITH MINI MONOKA STENT PLACEMENT;  Surgeon: Delia Chimes, MD;  Location: [place];  Service: Ophthalmology;  Laterality: Right;   LESION EXCISION WITH COMPLEX REPAIR N/A 05/12/2021   Procedure: NASAL LESION EXCISION;  Surgeon: Delia Chimes, MD;  Location: [place] [facility];  Service: Ophthalmology;  Laterality: N/A;   LOWER EXTREMITY VENOGRAPHY Left 05/26/2019   Procedure: LOWER EXTREMITY VENOGRAPHY;  Surgeon: Waynetta Sandy, MD;  Location: [place] [facility];  Service: Cardiovascular;  Laterality: Left;   MULTIPLE TOOTH EXTRACTIONS     NASAL SINUS SURGERY     PERIPHERAL VASCULAR INTERVENTION Left 05/26/2019   Procedure: PERIPHERAL VASCULAR INTERVENTION;  Surgeon: Waynetta Sandy, MD;  Location: [place] [facility];  Service: Cardiovascular;  Laterality: Left;  lower extremity veins   PERIPHERAL VASCULAR THROMBECTOMY  05/26/2019   Procedure: PERIPHERAL VASCULAR THROMBECTOMY;  Surgeon: Waynetta Sandy, MD;  Location: [place] [facility];  Service: Cardiovascular;;   PTOSIS REPAIR Bilateral 05/12/2021   Procedure: EXTERNAL PTOSIS REPAIR LEVATOR ADVANCEDMENT/RESECTION OF BILATERAL UPPER EYE LIDS;  Surgeon: Delia Chimes, MD;  Location: [place];  Service: Ophthalmology;  Laterality: Bilateral;   TUBAL LIGATION     VENA CAVA FILTER PLACEMENT N/A 01/13/2019   Procedure: INSERTION VENA-CAVA FILTER;  Surgeon: Waynetta Sandy, MD;  Location: [place];  Service: Vascular;  Laterality: N/A;     Social History:   reports  that she quit smoking about 23 years ago. Her smoking use included cigarettes. She has a 10.00 pack-year smoking history. She has never used smokeless tobacco. She reports that she does not drink alcohol and does not use drugs.   Family History:  Her family history includes Coronary artery disease in her brother and mother; Diabetes in her mother; Emphysema in her father.   Allergies Allergies  Allergen Reactions   Pneumococcal Vaccines Shortness Of Breath and Other (See Comments)    Couldn't breathe    Clarithromycin Swelling   Estradiol Rash    Reaction to cream   Sulfa Antibiotics Nausea And Vomiting and Swelling   Doxycycline Hyclate     Other reaction(s): Other (See Comments), stomach distress   Penicillins Swelling    Tongue swelling Did it involve swelling of the face/tongue/throat, SOB, or low BP? Yes Did it involve sudden or severe rash/hives, skin peeling, or any reaction on the inside of your mouth or nose? Yes Did you need to seek medical attention at a hospital or doctor's office? Yes When did it last happen?  childhood If all above answers are "NO", may proceed with cephalosporin use.    Pneumococcal Polysaccharide Vaccine     Other reaction(s): Unknown   Sulfacetamide Sodium-Sulfur Other (See Comments)   Sulfonamide Derivatives Swelling     Home Medications  Prior to Admission medications   Medication Sig Start Date End Date Taking? Authorizing Provider  acetaminophen (TYLENOL) 500 MG tablet Take 500 mg by mouth every 6 (six) hours as needed for headache (pain).   Yes [provider]  albuterol (ACCUNEB) 0.63 MG/3ML nebulizer solution Take 1 ampule by nebulization every 6 (six) hours as needed for shortness of breath or wheezing. 11/14/21  Yes [provider]  albuterol (VENTOLIN HFA) 108 (90 Base) MCG/ACT inhaler Inhale 1-2 puffs into the lungs every 6 (six) hours as needed for wheezing or shortness of breath.   Yes [provider]   aspirin EC 81 MG tablet Take 81 mg by mouth daily. Swallow whole.   Yes [provider]  ELIQUIS 5 MG TABS tablet Take 5 mg by mouth 2 (two) times daily. 12/01/21  Yes [provider]  fluticasone-salmeterol (ADVAIR) 250-50 MCG/ACT AEPB Inhale 1 puff then rinse mouth, twice daily Patient taking differently: Inhale 1 puff into the lungs in the  morning and at bedtime. Inhale 1 puff then rinse mouth, twice daily 09/29/20  Yes Young, Clinton D, MD  LORazepam (ATIVAN) 1 MG tablet Take 1 mg by mouth at bedtime.   Yes [provider]  omeprazole (PRILOSEC) 20 MG capsule Take 20 mg by mouth daily. 03/26/20  Yes [provider]  traZODone (DESYREL) 100 MG tablet Take 100 mg by mouth at bedtime.   Yes [provider]  triamterene-hydrochlorothiazide (MAXZIDE-25) 37.5-25 MG tablet Take 0.5 tablets by mouth daily. 02/06/19  Yes [provider]  cetirizine (ZYRTEC) 10 MG tablet Take 10 mg by mouth daily as needed for allergies. Patient not taking: Reported on 01/23/2022    [provider]  doxycycline (VIBRA-TABS) 100 MG tablet Take 1 tablet (100 mg total) by mouth 2 (two) times daily. Patient not taking: Reported on 01/23/2022 12/08/21   Baird Lyons D, MD  escitalopram (LEXAPRO) 20 MG tablet Take 20 mg by mouth daily. Patient not taking: Reported on 01/23/2022    [provider]  levofloxacin (LEVAQUIN) 750 MG tablet Take 1 tablet (750 mg total) by mouth daily. Patient not taking: Reported on 01/23/2022 12/27/21   Deneise Lever, MD  theophylline (THEO-24) 400 MG 24 hr capsule Take 1 capsule (400 mg total) by mouth daily. Patient not taking: Reported on 01/23/2022 11/24/21   Deneise Lever, MD  theophylline (UNIPHYL) 400 MG 24 hr tablet Take 400 mg by mouth daily. Patient not taking: Reported on 01/23/2022 12/23/21   [provider]     Romana Juniper, MD 01/25/22

## 2022-01-26 ENCOUNTER — Inpatient Hospital Stay (HOSPITAL_COMMUNITY): Payer: PPO

## 2022-01-26 ENCOUNTER — Telehealth: Payer: Self-pay | Admitting: Pulmonary Disease

## 2022-01-26 ENCOUNTER — Other Ambulatory Visit (HOSPITAL_COMMUNITY): Payer: Self-pay

## 2022-01-26 DIAGNOSIS — F419 Anxiety disorder, unspecified: Secondary | ICD-10-CM | POA: Diagnosis not present

## 2022-01-26 DIAGNOSIS — I2782 Chronic pulmonary embolism: Secondary | ICD-10-CM | POA: Diagnosis not present

## 2022-01-26 DIAGNOSIS — J984 Other disorders of lung: Secondary | ICD-10-CM | POA: Diagnosis not present

## 2022-01-26 DIAGNOSIS — J189 Pneumonia, unspecified organism: Secondary | ICD-10-CM | POA: Diagnosis not present

## 2022-01-26 DIAGNOSIS — I10 Essential (primary) hypertension: Secondary | ICD-10-CM | POA: Diagnosis not present

## 2022-01-26 LAB — ACID FAST SMEAR (AFB, MYCOBACTERIA): Acid Fast Smear: NEGATIVE

## 2022-01-26 LAB — CULTURE, RESPIRATORY W GRAM STAIN
Culture: NO GROWTH
Gram Stain: NONE SEEN

## 2022-01-26 LAB — CYTOLOGY - NON PAP

## 2022-01-26 MED ORDER — DIPHENHYDRAMINE HCL 50 MG/ML IJ SOLN
25.0000 mg | Freq: Once | INTRAMUSCULAR | Status: DC | PRN
Start: 2022-01-26 — End: 2022-01-26

## 2022-01-26 MED ORDER — AMOXICILLIN-POT CLAVULANATE 875-125 MG PO TABS
1.0000 | ORAL_TABLET | Freq: Two times a day (BID) | ORAL | 0 refills | Status: DC
  Filled 2022-01-26: qty 42, 21d supply, fill #0

## 2022-01-26 MED ORDER — AMOXICILLIN 500 MG PO CAPS
500.0000 mg | ORAL_CAPSULE | Freq: Once | ORAL | Status: AC
  Administered 2022-01-26: 500 mg via ORAL
  Filled 2022-01-26: qty 1

## 2022-01-26 MED ORDER — EPINEPHRINE 0.3 MG/0.3ML IJ SOAJ
0.3000 mg | Freq: Once | INTRAMUSCULAR | Status: DC | PRN
  Filled 2022-01-26: qty 0.6

## 2022-01-26 MED ORDER — DIPHENHYDRAMINE HCL 25 MG PO CAPS
25.0000 mg | ORAL_CAPSULE | Freq: Once | ORAL | Status: DC | PRN
  Filled 2022-01-26: qty 1

## 2022-01-26 NOTE — Discharge Summary (Signed)
PATIENT DETAILS Name: [name] Age: 81 y.o. Sex: female Date of Birth: August 01, 1940 MRN: [id number]. Admitting Physician: Guilford Shi, MD [id number], [name], [name]  Admit Date: 01/23/2022 Discharge date: 01/26/2022  Recommendations for Outpatient Follow-up:  Follow up with PCP in 1-2 weeks Please obtain CMP/CBC in one week Please follow BAL cultures until final-including AFB/fungal cultures.   Please ensure follow-up with pulmonology. Incidental finding-4 cm thoracic aneurysm-radiology recommending annual CTA/MRA. Eliquis on hold-given persistent hemoptysis.  Outpatient physicians to consider resuming if her hemoptysis resolves/lung abscess resolves.    Admitted From:  Home  Disposition: Home   Discharge Condition: fair  CODE STATUS:   Code Status: Full Code   Diet recommendation:  Diet Order             Diet - low sodium heart healthy           Diet regular Room service appropriate? Yes; Fluid consistency: Thin  Diet effective now                    Brief Summary: Patient is a 81 y.o.  female with history of VTE, COPD-with chronic hypoxic respiratory failure on home nocturnal O2-who presented to the hospital with worsening cough/hemoptysis-found to have lung abscess and subsequently admitted to the hospitalist service.   Significant events: 8/28>> worsening hemoptysis/dyspnea-found to have lung abscess.   Significant studies: 8/28>> CTA chest: New large alveolar infiltrate in the right lower lobe with cavitary lesion.  No PE. 8/30>> barium esophagogram: No aspiration observed-no strictures/masses.   Significant microbiology data: 8/29>> BAL culture: Pending 8/29>> BAL AFB smear: Negative 8/29>> BAL AFB/fungal culture: Pending   Pathology: 8/29>> BAL cytology: No malignant cells.   Procedures: 8/29>> bronchoscopy   Consults: PCCM  Brief Hospital Course: Lung abscess: Some small-volume hemoptysis continues intermittently-treated with  cefepime/clindamycin while inpatient-evaluated by pulmonology-underwent bronchoscopy-cultures negative so far.  After discussion with Dr. Vista Mink is to continue to hold Eliquis given intermittent ongoing hemoptysis-apparently her second VTE was a DVT in the upper extremity.  After discussion with infectious disease-she was given a dose of amoxicillin without any anaphylaxis-hence plan is to send her home on 3 weeks of Augmentin.  Pulmonology will arrange for close outpatient follow-up.  Note-barium esophagogram was unremarkable for strictures or stenosis.    VTE:  Eliquis was resumed post bronchoscopy-however she continued to have intermittent small-volume hemoptysis.  Per my discussion with pulmonology-Dr. Imagene Gurney her second VTE was a upper extremity DVT-it is felt that we could continue to hold her Eliquis until her hemoptysis/lung abscess completely resolves.  Consideration for resumption of hemoptysis could be done in the outpatient setting once her acute issues have stabilized or resolved.  COPD: Not in exacerbation-continue bronchodilators.   GERD: Continue PPI   HLD: Continue statin   HTN: BP stable-continue triamterene/HCTZ   Anxiety with depression: Stable-continue trazodone/lorazepam.   4 cm ascending thoracic aneurysm: Radiology recommending annual CTA//MRA.   BMI Estimated body mass index is 27.12 kg/m as calculated from the following:   Height as of this encounter: _0  (1.676 m).   Weight as of this encounter: 76.2 kg.   Discharge Diagnoses:  Principal Problem:   Cavitary pneumonia Active Problems:   Chronic pulmonary embolism (HCC)   COPD mixed type (HCC)   Esophageal reflux   Dyslipidemia   Sciatica   Essential hypertension   Anxiety and depression   Discharge Instructions:  Activity:  As tolerated   Discharge Instructions     Call MD for:  difficulty breathing, headache or visual disturbances   Complete by: As directed    Call MD  for:  persistant dizziness or light-headedness   Complete by: As directed    Diet - low sodium heart healthy   Complete by: As directed    Discharge instructions   Complete by: As directed    Follow with Primary MD  [name], [name] in 1-2 weeks  Follow bronchoscopy cultures until final  Please follow with your lung doctor-office will give you a phone call soon-if you do not hear from them-please give them a call.  You were incidentally found to have a 4 cm ascending thoracic aneurysm-you will need a annual CTA or MRI scan for surveillance.  Please ask your primary care practitioner to arrange this.    Please get a complete blood count and chemistry panel checked by your Primary MD at your next visit, and again as instructed by your Primary MD.  Get Medicines reviewed and adjusted: Please take all your medications with you for your next visit with your Primary MD  Laboratory/radiological data: Please request your Primary MD to go over all hospital tests and procedure/radiological results at the follow up, please ask your Primary MD to get all Hospital records sent to his/her office.  In some cases, they will be blood work, cultures and biopsy results pending at the time of your discharge. Please request that your primary care M.D. follows up on these results.  Also Note the following: If you experience worsening of your admission symptoms, develop shortness of breath, life threatening emergency, suicidal or homicidal thoughts you must seek medical attention immediately by calling 911 or calling your MD immediately  if symptoms less severe.  You must read complete instructions/literature along with all the possible adverse reactions/side effects for all the Medicines you take and that have been prescribed to you. Take any new Medicines after you have completely understood and accpet all the possible adverse reactions/side effects.   Do not drive when taking Pain medications or  sleeping medications (Benzodaizepines)  Do not take more than prescribed Pain, Sleep and Anxiety Medications. It is not advisable to combine anxiety,sleep and pain medications without talking with your primary care practitioner  Special Instructions: If you have smoked or chewed Tobacco  in the last 2 yrs please stop smoking, stop any regular Alcohol  and or any Recreational drug use.  Wear Seat belts while driving.  Please note: You were cared for by a hospitalist during your hospital stay. Once you are discharged, your primary care physician will handle any further medical issues. Please note that NO REFILLS for any discharge medications will be authorized once you are discharged, as it is imperative that you return to your primary care physician (or establish a relationship with a primary care physician if you do not have one) for your post hospital discharge needs so that they can reassess your need for medications and monitor your lab values.   Increase activity slowly   Complete by: As directed       Allergies as of 01/26/2022       Reactions   Pneumococcal Vaccines Shortness Of Breath, Other (See Comments)   Couldn't breathe   Clarithromycin Swelling   Estradiol Rash   Reaction to cream   Sulfa Antibiotics Nausea And Vomiting, Swelling   Doxycycline Hyclate    Other reaction(s): Other (See Comments), stomach distress   Penicillins Swelling   Tongue swelling Did it involve swelling of the face/tongue/throat,  SOB, or low BP? Yes Did it involve sudden or severe rash/hives, skin peeling, or any reaction on the inside of your mouth or nose? Yes Did you need to seek medical attention at a hospital or doctor's office? Yes When did it last happen?  childhood If all above answers are "NO", may proceed with cephalosporin use.   Pneumococcal Polysaccharide Vaccine    Other reaction(s): Unknown   Sulfacetamide Sodium-sulfur Other (See Comments)   Sulfonamide Derivatives Swelling         Medication List     STOP taking these medications    cetirizine 10 MG tablet Commonly known as: ZYRTEC   doxycycline 100 MG tablet Commonly known as: VIBRA-TABS   Eliquis 5 MG Tabs tablet Generic drug: apixaban   escitalopram 20 MG tablet Commonly known as: LEXAPRO   levofloxacin 750 MG tablet Commonly known as: Levaquin   theophylline 400 MG 24 hr capsule Commonly known as: THEO-24   theophylline 400 MG 24 hr tablet Commonly known as: UNIPHYL       TAKE these medications    acetaminophen 500 MG tablet Commonly known as: TYLENOL Take 500 mg by mouth every 6 (six) hours as needed for headache (pain).   albuterol 108 (90 Base) MCG/ACT inhaler Commonly known as: VENTOLIN HFA Inhale 1-2 puffs into the lungs every 6 (six) hours as needed for wheezing or shortness of breath.   albuterol 0.63 MG/3ML nebulizer solution Commonly known as: ACCUNEB Take 1 ampule by nebulization every 6 (six) hours as needed for shortness of breath or wheezing.   amoxicillin-clavulanate 875-125 MG tablet Commonly known as: AUGMENTIN Take 1 tablet by mouth 2 (two) times daily for 21 days.   aspirin EC 81 MG tablet Take 81 mg by mouth daily. Swallow whole.   fluticasone-salmeterol 250-50 MCG/ACT Aepb Commonly known as: ADVAIR Inhale 1 puff then rinse mouth, twice daily What changed:  how much to take how to take this when to take this   LORazepam 1 MG tablet Commonly known as: ATIVAN Take 1 mg by mouth at bedtime.   omeprazole 20 MG capsule Commonly known as: PRILOSEC Take 20 mg by mouth daily.   traZODone 100 MG tablet Commonly known as: DESYREL Take 100 mg by mouth at bedtime.   triamterene-hydrochlorothiazide 37.5-25 MG tablet Commonly known as: MAXZIDE-25 Take 0.5 tablets by mouth daily.        Allergies  Allergen Reactions   Pneumococcal Vaccines Shortness Of Breath and Other (See Comments)    Couldn't breathe    Clarithromycin Swelling   Estradiol  Rash    Reaction to cream   Sulfa Antibiotics Nausea And Vomiting and Swelling   Doxycycline Hyclate     Other reaction(s): Other (See Comments), stomach distress   Penicillins Swelling    Tongue swelling Did it involve swelling of the face/tongue/throat, SOB, or low BP? Yes Did it involve sudden or severe rash/hives, skin peeling, or any reaction on the inside of your mouth or nose? Yes Did you need to seek medical attention at a hospital or doctor's office? Yes When did it last happen?  childhood If all above answers are "NO", may proceed with cephalosporin use.    Pneumococcal Polysaccharide Vaccine     Other reaction(s): Unknown   Sulfacetamide Sodium-Sulfur Other (See Comments)   Sulfonamide Derivatives Swelling     Other Procedures/Studies: DG Chest 2 View  Result Date: 01/26/2022 CLINICAL DATA:  Hemoptysis with cough. EXAM: CHEST - 2 VIEW COMPARISON:  CTA chest 01/23/2022 FINDINGS:  Lungs are hyperexpanded. Cavitary lesion with air-fluid level in the right mid lung was better characterized on recent chest CTA. Patchy airspace disease noted in the lung bases, right greater than left. No pleural effusion. Telemetry leads overlie the chest. IMPRESSION: Cavitary lesion in the right mid lung with air-fluid level, as noted on recent chest CTA. Right greater than left patchy airspace disease in the lung bases without pleural effusion. Electronically Signed   By: Misty Stanley M.D.   On: 01/26/2022 12:17   DG ESOPHAGUS W SINGLE CM (SOL OR THIN BA)  Result Date: 01/25/2022 CLINICAL DATA:  Patient with a history of dysphagia. Patient complains of feeling like solids do not go down, coughing after eating solid foods, and regurgitation if she eats too much at once. Barium study requested. EXAM: ESOPHAGUS/BARIUM SWALLOW/TABLET STUDY TECHNIQUE: Single contrast examination was performed using thin liquid barium. This exam was performed by Soyla Dryer, NP, and was supervised and interpreted by  Dr. Maree Erie. FLUOROSCOPY: Radiation Exposure Index (as provided by the fluoroscopic device): 21.10 mGy Kerma COMPARISON:  None Available. FINDINGS: Swallowing: Appears normal. No vestibular penetration or aspiration seen. Pharynx: Unremarkable. Esophagus: Normal appearance. Esophageal motility: Poor primary peristalsis with tertiary contractions observed Hiatal Hernia: Small-moderate sized hiatal hernia observed Gastroesophageal reflux: None visualized. Ingested 21m barium tablet: Passed into the hiatal hernia Other: None. IMPRESSION: No aspiration observed. No stricture or mass seen in the esophagus. Poor primary peristalsis with tertiary contractions. Small to moderate-sized hiatal hernia present. 13 mm barium tablet passed easily into the hiatal hernia. Patient tolerated procedure well with no coughing or other complaints. Read by: JSoyla Dryer NP Electronically Signed   By: MNelson ChimesM.D.   On: 01/25/2022 11:35   CT Angio Chest PE W and/or Wo Contrast  Result Date: 01/23/2022 CLINICAL DATA:  Chest pain, cough, hemoptysis EXAM: CT ANGIOGRAPHY CHEST WITH CONTRAST TECHNIQUE: Multidetector CT imaging of the chest was performed using the standard protocol during bolus administration of intravenous contrast. Multiplanar CT image reconstructions and MIPs were obtained to evaluate the vascular anatomy. RADIATION DOSE REDUCTION: This exam was performed according to the departmental dose-optimization program which includes automated exposure control, adjustment of the mA and/or kV according to patient size and/or use of iterative reconstruction technique. CONTRAST:  863mOMNIPAQUE IOHEXOL 350 MG/ML SOLN COMPARISON:  Previous studies including the CT done on 062023-06-20nd chest radiographs done today FINDINGS: Cardiovascular: There is no evidence of pulmonary artery embolism. There is ectasia of the main pulmonary artery measuring 3.5 cm suggesting pulmonary arterial hypertension. There is homogeneous  enhancement in thoracic aorta. There is ectasia of ascending thoracic aorta measuring 4 cm. Coronary artery calcifications are seen. Mediastinum/Nodes: No significant lymphadenopathy is seen. Lungs/Pleura: There is interval appearance of large patchy alveolar infiltrate in right lower lobe suggesting pneumonia. There is 8.7 x 5 cm cavitary lesion with air-fluid level in superior segment of right lower lobe. Rest of the lung fields show no focal infiltrates. There is no significant pleural effusion or pneumothorax. Upper Abdomen: No acute findings are seen. Musculoskeletal: Visualized bony structures are unremarkable. Augmentation/reconstruction prostheses are seen in both breasts. Review of the MIP images confirms the above findings. IMPRESSION: There is new large alveolar infiltrate in right lower lobe suggesting pneumonia. There is a large cavitary lesion with air-fluid level in the superior segment of right lower lobe suggesting possible lung abscess. There is no evidence of pulmonary artery embolism. There is ectasia of main pulmonary artery suggesting pulmonary arterial hypertension. There is no evidence  of thoracic aortic dissection. Coronary artery calcifications are seen. There is 4 cm ascending thoracic aneurysm with no significant change. Recommend annual imaging followup by CTA or MRA. This recommendation follows 2010 ACCF/AHA/AATS/ACR/ASA/SCA/SCAI/SIR/STS/SVM Guidelines for the Diagnosis and Management of Patients with Thoracic Aortic Disease. Circulation. 2010; 121: D638-V564. Aortic aneurysm NOS (ICD10-I71.9) Electronically Signed   By: Elmer Picker M.D.   On: 01/23/2022 13:02   DG Chest 2 View  Result Date: 01/23/2022 CLINICAL DATA:  Shortness of breath.  Recent diagnosis of pneumonia. EXAM: CHEST - 2 VIEW COMPARISON:  CT 12/12/2021 FINDINGS: Normal heart size. No pleural effusion identified. There is diffuse airspace disease throughout the right lower lobe, most severe within the superior  segment. Left lung appears clear. Visualized osseous structures are unremarkable. IMPRESSION: 1. New right lower lobe airspace disease compatible with pneumonia. Followup PA and lateral chest X-ray is recommended in 3-4 weeks following trial of antibiotic therapy to ensure resolution and exclude underlying malignancy. Electronically Signed   By: Kerby Moors M.D.   On: 01/23/2022 08:50     TODAY-DAY OF DISCHARGE:  Subjective:   Roma Kayser today has no headache,no chest abdominal pain,no new weakness tingling or numbness, feels much better wants to go home today.   Objective:   Blood pressure (!) 129/92, pulse 80, temperature 98.4 F (36.9 C), temperature source Oral, resp. rate 11, height _0  (1.676 m), weight 76.2 kg, SpO2 91 %.  Intake/Output Summary (Last 24 hours) at 01/26/2022 1343 Last data filed at 01/26/2022 0034 Gross per 24 hour  Intake 200 ml  Output --  Net 200 ml   Filed Weights   01/23/22 0817  Weight: 76.2 kg    Exam: Awake Alert, Oriented *3, No new F.N deficits, Normal affect [place].AT,PERRAL Supple Neck,No JVD, No cervical lymphadenopathy appriciated.  Symmetrical Chest wall movement, Good air movement bilaterally, CTAB RRR,No Gallops,Rubs or new Murmurs, No Parasternal Heave +ve B.Sounds, Abd Soft, Non tender, No organomegaly appriciated, No rebound -guarding or rigidity. No Cyanosis, Clubbing or edema, No new Rash or bruise   PERTINENT RADIOLOGIC STUDIES: DG Chest 2 View  Result Date: 01/26/2022 CLINICAL DATA:  Hemoptysis with cough. EXAM: CHEST - 2 VIEW COMPARISON:  CTA chest 01/23/2022 FINDINGS: Lungs are hyperexpanded. Cavitary lesion with air-fluid level in the right mid lung was better characterized on recent chest CTA. Patchy airspace disease noted in the lung bases, right greater than left. No pleural effusion. Telemetry leads overlie the chest. IMPRESSION: Cavitary lesion in the right mid lung with air-fluid level, as noted on recent chest CTA. Right  greater than left patchy airspace disease in the lung bases without pleural effusion. Electronically Signed   By: Misty Stanley M.D.   On: 01/26/2022 12:17   DG ESOPHAGUS W SINGLE CM (SOL OR THIN BA)  Result Date: 01/25/2022 CLINICAL DATA:  Patient with a history of dysphagia. Patient complains of feeling like solids do not go down, coughing after eating solid foods, and regurgitation if she eats too much at once. Barium study requested. EXAM: ESOPHAGUS/BARIUM SWALLOW/TABLET STUDY TECHNIQUE: Single contrast examination was performed using thin liquid barium. This exam was performed by Soyla Dryer, NP, and was supervised and interpreted by Dr. Maree Erie. FLUOROSCOPY: Radiation Exposure Index (as provided by the fluoroscopic device): 21.10 mGy Kerma COMPARISON:  None Available. FINDINGS: Swallowing: Appears normal. No vestibular penetration or aspiration seen. Pharynx: Unremarkable. Esophagus: Normal appearance. Esophageal motility: Poor primary peristalsis with tertiary contractions observed Hiatal Hernia: Small-moderate sized hiatal hernia observed Gastroesophageal reflux: None visualized.  Ingested 39m barium tablet: Passed into the hiatal hernia Other: None. IMPRESSION: No aspiration observed. No stricture or mass seen in the esophagus. Poor primary peristalsis with tertiary contractions. Small to moderate-sized hiatal hernia present. 13 mm barium tablet passed easily into the hiatal hernia. Patient tolerated procedure well with no coughing or other complaints. Read by: JSoyla Dryer NP Electronically Signed   By: MNelson ChimesM.D.   On: 01/25/2022 11:35     PERTINENT LAB RESULTS: CBC: Recent Labs    01/24/22 1437 01/25/22 1319  WBC 13.9* 14.9*  HGB 14.4 14.3  HCT 43.9 43.5  PLT 346 404*   CMET CMP     Component Value Date/Time   NA 139 01/25/2022 1319   K 3.6 01/25/2022 1319   CL 101 01/25/2022 1319   CO2 27 01/25/2022 1319   GLUCOSE 87 01/25/2022 1319   BUN 20 01/25/2022 1319    CREATININE 1.00 01/25/2022 1319   CREATININE 0.80 07/28/2019 1014   CALCIUM 9.4 01/25/2022 1319   PROT 5.9 (L) 06/12/2020 0129   ALBUMIN 3.1 (L) 06/12/2020 0129   AST 31 06/12/2020 0129   AST 15 07/28/2019 1014   ALT 40 06/12/2020 0129   ALT 12 07/28/2019 1014   ALKPHOS 56 06/12/2020 0129   BILITOT 1.0 06/12/2020 0129   BILITOT 0.6 07/28/2019 1014   GFRNONAA 58 (L) 01/25/2022 1319   GFRNONAA >60 07/28/2019 1014   GFRAA >60 11/22/2019 0603   GFRAA >60 07/28/2019 1014    GFR Estimated Creatinine Clearance: 48.4 mL/min (by C-G formula based on SCr of 1 mg/dL). No results for input(s): "LIPASE", "AMYLASE" in the last 72 hours. No results for input(s): "CKTOTAL", "CKMB", "CKMBINDEX", "TROPONINI" in the last 72 hours. Invalid input(s): "POCBNP" No results for input(s): "DDIMER" in the last 72 hours. No results for input(s): "HGBA1C" in the last 72 hours. No results for input(s): "CHOL", "HDL", "LDLCALC", "TRIG", "CHOLHDL", "LDLDIRECT" in the last 72 hours. No results for input(s): "TSH", "T4TOTAL", "T3FREE", "THYROIDAB" in the last 72 hours.  Invalid input(s): "FREET3" No results for input(s): "VITAMINB12", "FOLATE", "FERRITIN", "TIBC", "IRON", "RETICCTPCT" in the last 72 hours. Coags: No results for input(s): "INR" in the last 72 hours.  Invalid input(s): "PT" Microbiology: Recent Results (from the past 240 hour(s))  Resp Panel by RT-PCR (Flu A&B, Covid) Anterior Nasal Swab     Status: None   Collection Time: 01/23/22 12:23 PM   Specimen: Anterior Nasal Swab  Result Value Ref Range Status   SARS Coronavirus 2 by RT PCR NEGATIVE NEGATIVE Final    Comment: (NOTE) SARS-CoV-2 target nucleic acids are NOT DETECTED.  The SARS-CoV-2 RNA is generally detectable in upper respiratory specimens during the acute phase of infection. The lowest concentration of SARS-CoV-2 viral copies this assay can detect is 138 copies/mL. A negative result does not preclude SARS-Cov-2 infection and  should not be used as the sole basis for treatment or other patient management decisions. A negative result may occur with  improper specimen collection/handling, submission of specimen other than nasopharyngeal swab, presence of viral mutation(s) within the areas targeted by this assay, and inadequate number of viral copies(<138 copies/mL). A negative result must be combined with clinical observations, patient history, and epidemiological information. The expected result is Negative.  Fact Sheet for Patients:  hEntrepreneurPulse.com.au Fact Sheet for Healthcare Providers:  hIncredibleEmployment.be This test is no t yet approved or cleared by the [place] and  has been authorized for detection and/or diagnosis of SARS-CoV-2 by  FDA under an Emergency Use Authorization (EUA). This EUA will remain  in effect (meaning this test can be used) for the duration of the COVID-19 declaration under Section 564(b)(1) of the Act, 21 U.S.C.section 360bbb-3(b)(1), unless the authorization is terminated  or revoked sooner.       Influenza A by PCR NEGATIVE NEGATIVE Final   Influenza B by PCR NEGATIVE NEGATIVE Final    Comment: (NOTE) The Xpert Xpress SARS-CoV-2/FLU/RSV plus assay is intended as an aid in the diagnosis of influenza from Nasopharyngeal swab specimens and should not be used as a sole basis for treatment. Nasal washings and aspirates are unacceptable for Xpert Xpress SARS-CoV-2/FLU/RSV testing.  Fact Sheet for Patients: EntrepreneurPulse.com.au  Fact Sheet for Healthcare Providers: IncredibleEmployment.be  This test is not yet approved or cleared by the [place] FDA and has been authorized for detection and/or diagnosis of SARS-CoV-2 by FDA under an Emergency Use Authorization (EUA). This EUA will remain in effect (meaning this test can be used) for the duration of the COVID-19 declaration  under Section 564(b)(1) of the Act, 21 U.S.C. section 360bbb-3(b)(1), unless the authorization is terminated or revoked.  Performed at [facility], [place] [address] [place], [place] [address]   Culture, blood (Routine X 2) w Reflex to ID Panel     Status: None (Preliminary result)   Collection Time: 01/23/22  4:10 PM   Specimen: BLOOD  Result Value Ref Range Status   Specimen Description BLOOD BLOOD LEFT HAND  Final   Special Requests   Final    BOTTLES DRAWN AEROBIC ONLY Blood Culture results may not be optimal due to an inadequate volume of blood received in culture bottles   Culture   Final    NO GROWTH 3 DAYS Performed at [facility], [place] [address] [place], [place] [address]    Report Status PENDING  Incomplete  Culture, blood (Routine X 2) w Reflex to ID Panel     Status: None (Preliminary result)   Collection Time: 01/23/22  4:21 PM   Specimen: BLOOD  Result Value Ref Range Status   Specimen Description BLOOD SITE NOT SPECIFIED  Final   Special Requests   Final    BOTTLES DRAWN AEROBIC AND ANAEROBIC Blood Culture adequate volume   Culture   Final    NO GROWTH 3 DAYS Performed at [facility], [address], [place], [place] [address]    Report Status PENDING  Incomplete  MRSA Next Gen by PCR, Nasal     Status: None   Collection Time: 01/24/22  2:54 AM   Specimen: Nasal Mucosa; Nasal Swab  Result Value Ref Range Status   MRSA by PCR Next Gen NOT DETECTED NOT DETECTED Final    Comment: (NOTE) The GeneXpert MRSA Assay (FDA approved for NASAL specimens only), is one component of a comprehensive MRSA colonization surveillance program. It is not intended to diagnose MRSA infection nor to guide or monitor treatment for MRSA infections. Test performance is not FDA approved in patients less than 27 years old. Performed at [facility], [place] [address] [place], [place] [address]   Culture, Respiratory w Gram Stain     Status: None   Collection  Time: 01/24/22 10:06 AM   Specimen: Bronchial Alveolar Lavage; Respiratory  Result Value Ref Range Status   Specimen Description BRONCHIAL ALVEOLAR LAVAGE  Final   Special Requests  RIGHT LOWER LOBE  Final   Gram Stain NO WBC SEEN NO ORGANISMS  SEEN   Final   Culture   Final    NO GROWTH 2 DAYS Performed at [facility], [place] [address], [place], [place] [address]    Report Status 01/26/2022 FINAL  Final  Acid Fast Smear (AFB)     Status: None   Collection Time: 01/24/22 10:06 AM   Specimen: Bronchial Alveolar Lavage; Respiratory  Result Value Ref Range Status   AFB Specimen Processing Concentration  Final   Acid Fast Smear Negative  Final    Comment: (NOTE) Performed At: [facility] [place], [place] [id number] Rush Farmer MD [id number]    Source (AFB) BRONCHIAL ALVEOLAR LAVAGE  Final    Comment: Performed at [facility], [place] [address] [place], [place] [address]  Anaerobic culture w Gram Stain     Status: None (Preliminary result)   Collection Time: 01/24/22 10:06 AM   Specimen: Bronchial Alveolar Lavage; Respiratory  Result Value Ref Range Status   Specimen Description BRONCHIAL ALVEOLAR LAVAGE  Final   Special Requests NONE  Final   Gram Stain   Final    NO ORGANISMS SEEN NO WBC SEEN Performed at [facility], [address], [place], [place] [address]    Culture   Final    NO ANAEROBES ISOLATED; CULTURE IN PROGRESS FOR 5 DAYS   Report Status PENDING  Incomplete    FURTHER DISCHARGE INSTRUCTIONS:  Get Medicines reviewed and adjusted: Please take all your medications with you for your next visit with your Primary MD  Laboratory/radiological data: Please request your Primary MD to go over all hospital tests and procedure/radiological results at the follow up, please ask your Primary MD to get all Hospital records sent to his/her office.  In some cases, they will be blood work, cultures and biopsy results pending at the  time of your discharge. Please request that your primary care M.D. goes through all the records of your hospital data and follows up on these results.  Also Note the following: If you experience worsening of your admission symptoms, develop shortness of breath, life threatening emergency, suicidal or homicidal thoughts you must seek medical attention immediately by calling 911 or calling your MD immediately  if symptoms less severe.  You must read complete instructions/literature along with all the possible adverse reactions/side effects for all the Medicines you take and that have been prescribed to you. Take any new Medicines after you have completely understood and accpet all the possible adverse reactions/side effects.   Do not drive when taking Pain medications or sleeping medications (Benzodaizepines)  Do not take more than prescribed Pain, Sleep and Anxiety Medications. It is not advisable to combine anxiety,sleep and pain medications without talking with your primary care practitioner  Special Instructions: If you have smoked or chewed Tobacco  in the last 2 yrs please stop smoking, stop any regular Alcohol  and or any Recreational drug use.  Wear Seat belts while driving.  Please note: You were cared for by a hospitalist during your hospital stay. Once you are discharged, your primary care physician will handle any further medical issues. Please note that NO REFILLS for any discharge medications will be authorized once you are discharged, as it is imperative that you return to your primary care physician (or establish a relationship with a primary care physician if you do not have one) for your post hospital discharge needs so that they can reassess your need for medications and monitor your lab values.  Total Time spent coordinating discharge including counseling, education and face to face time equals greater than 30 minutes.  SignedOren Binet 01/26/2022 1:43 PM

## 2022-01-26 NOTE — Care Management Important Message (Signed)
Important Message  Patient Details  Name: [name] MRN: [id number] Date of Birth: 1940/10/07   Medicare Important Message Given:  Yes     Orbie Pyo 01/26/2022, 2:05 PM

## 2022-01-26 NOTE — Progress Notes (Signed)
Attending:    Subjective: Coughing up a little blood No fever Has some tightness in left chest Took augmentin and did fine  Objective: Vitals:   01/26/22 1245 01/26/22 1300 01/26/22 1316 01/26/22 1343  BP: (!) 159/99 124/85 (!) 129/92   Pulse: 85 80 80 92  Resp: (!) '22 20 11 18  '$ Temp: 98.5 F (36.9 C) 98.4 F (36.9 C)    TempSrc: Axillary Oral    SpO2:   91% 92%  Weight:      Height:          Intake/Output Summary (Last 24 hours) at 01/26/2022 1347 Last data filed at 01/26/2022 0034 Gross per 24 hour  Intake 200 ml  Output --  Net 200 ml    General:  Chronically ill appearing, resting comfortably in bed HENT: NCAT OP clear PULM: Poor air movement B, normal effort CV: RRR, no mgr GI: BS+, soft, nontender MSK: normal bulk and tone Neuro: awake, alert, no distress, MAEW    CBC    Component Value Date/Time   WBC 14.9 (H) 01/25/2022 1319   RBC 4.73 01/25/2022 1319   HGB 14.3 01/25/2022 1319   HCT 43.5 01/25/2022 1319   PLT 404 (H) 01/25/2022 1319   MCV 92.0 01/25/2022 1319   MCH 30.2 01/25/2022 1319   MCHC 32.9 01/25/2022 1319   RDW 14.1 01/25/2022 1319   LYMPHSABS 1.2 01/23/2022 1003   MONOABS 1.3 (H) 01/23/2022 1003   EOSABS 0.0 01/23/2022 1003   BASOSABS 0.0 01/23/2022 1003    BMET    Component Value Date/Time   NA 139 01/25/2022 1319   K 3.6 01/25/2022 1319   CL 101 01/25/2022 1319   CO2 27 01/25/2022 1319   GLUCOSE 87 01/25/2022 1319   BUN 20 01/25/2022 1319   CREATININE 1.00 01/25/2022 1319   CREATININE 0.80 07/28/2019 1014   CALCIUM 9.4 01/25/2022 1319   GFRNONAA 58 (L) 01/25/2022 1319   GFRNONAA >60 07/28/2019 1014   GFRAA >60 11/22/2019 0603   GFRAA >60 07/28/2019 1014    CXR images reviewed, large pneumatocele with air fluid level right lung  Impression/Plan: Right lung abscess> culture negative so far, agree with augmentin now that we know she can tolerate it; will treat for 3 weeks and then repeat CT chest Hemoptysis> stop  eliquis History of DVT> extensive history of this followed by [name] in the past with IVC filter and history of RP hematoma.  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  Since then she has not had recurrence, though she had a superficial thrombophlebitis recently so eliquis was restarted.  Consider the fact that it seems that she has only had one life time DVT (left lower ext) will hold eliquis given the risk benefit ratio (as eliquis is making her hemoptysis worse).  Monitor carefully for worsening leg or arm swelling.   F/u in pulmonary clinic late September  My cc time n/a minutes  Roselie Awkward, MD [facility] Pager: [phone] Cell: [phone] After 7pm: [phone]

## 2022-01-26 NOTE — Progress Notes (Signed)
Discharge instructions reviewed with pt.  Copy of instructions given to pt, pt's 1 script filled by [facility] and delivered to her room.  Pt waiting for family to arrive and bring O2 tank for transport home.  Pt to call front desk when family has arrived.

## 2022-01-26 NOTE — Progress Notes (Signed)
Penicillin Allergy Clarification: Oral Amoxicillin Challenge  History of allergy:  Low Risk - childhood allergy, pt describes as rash. Does not recall requiring immediate hospitalization. Noted to have tolerated cephalexin (Keflex) since then.  Type of intervention Amoxicillin Oral Challenge - plan discussed in detail with patient who is agreeable to proceed. RN informed on challenge, monitoring, and available prn meds as needed.  Impact on therapy: Penicillin allergy removed  Plan - Tolerated po Amoxicillin challenge  - Will remove the patient's PCN allergy - Plan is for the patient to discharge on oral Augmentin - Will reach out to the patient's PCP/pharmacies to remove the allergy as well  Thank you for allowing pharmacy to be a part of this patient's care.  Alycia Rossetti, PharmD, BCPS Infectious Diseases Clinical Pharmacist 01/26/2022 2:36 PM   **Pharmacist phone directory can now be found on [email or web address] ([email or web address]).  Listed under [facility].

## 2022-01-26 NOTE — Progress Notes (Addendum)
NAME:  SILVER ACHEY, MRN:  [id number], DOB:  28-May-1941, LOS: 3 ADMISSION DATE:  01/23/2022, CONSULTATION DATE:  01/23/22 REFERRING MD:  Sloan Leiter, CHIEF COMPLAINT:  coughing blood  History of Present Illness:  Ms. Dashay Giesler is a 81 yo F with a PMHx of DVT/PE, previously with an IVC filter s/p removal, recently back on Eliquis for L arm clot 11/2021, compliant, COPD and centrilobular emphysema, with intermittent nighlty O2 use, presenting to the [facility] with recurrent cough, subacute hemoptysis, and shortness of breath   Patient was in her usual state of health until June 2023 when she reported a worsening cough requiring her to present to the hospital where she was treated for a COPD exacerbation with Levaquin and a steroid taper. Patient denies resolution of cough or symptomatic improvement, though she was nor producing sputum. Patient was  followed by [name] in 12/08/2021 when he was given a second abx course of Doxycycline.    2 weeks ago, patient presented to UC for worsening cough, where she was prescribed a azithromycin, which she completed. She reports that the cough has not gotten worse, but that is continues to be persistent. It is worse when she eats, as she feels like food gets stuck in the back of her throat and sometimes goes "down the wrong pipe". She has a history of GERD and reports takes PPIs, which usually help with burning sensation.    She first noted streaks of blood on Saturday and it did not worsen until this morning.  Prior to this, she denies yellow or green sputum. Since then, she reports she's been coughing up ~1-2 teaspoons of blood. Patient denies over shortness of breath or needing to increase her O2 use at home. She also endorses R sided chest pain when she lays on her R side, but that there is no pain when she breaths.   Patient now s/p BAL, on clindamycin and cefepime, stable.  Pertinent  Medical History  DVT PE COPD HTN HLD GERD with hx of 3cm hiatal hernia  noted on EGD 2021  Significant Hospital Events: Including procedures, antibiotic start and stop dates in addition to other pertinent events   11/16/2021 Chest XR:  No acute infiltrate or effusion 11/17/2021 CT Angio chest PE: no PE, centrilobular emphysema, subsegmental atelectasis on RML and lingula. Hiatal hernia 12/12/2021 CT Angio PE: stable 4.1 cm ascending aortic aneurysm. Stable centrilobular emphysema with minimal subsegmental atelectasis in the RML. No airspace disease or effusion. NO PE.  01/23/22: RLL air space disease, worse in the superior segment. 01/23/22 CT Angio w contrast: New alveolar infiltrate in RLL likely pneumonia. Cavitary lesion with air-fluid level in superior segment of RLL, concerning for abscess. No PE. Ectasia of main pulmonary artery. Stable 4cm ascending thoracic aneurysm.  01/24/22 BAL with Dr. Lake Bells, sent for cultures and cytology, pending 01/25/22 SLP evaluation: functional oropharyngeal swallow, not able to exclude silent aspiration with esophageal dysfunction suspected given regurgitation. Awaiting barium esophagram.  01/25/2022 BAL cultures and cytology, pending. No WBC seen on gram stain 01/25/22 Esophagram: No strictures, aspiration, or masses.  Interim History / Subjective:  Patient feeling oka; feeling anxious in the hospital and being tethered to O2. Cough has improved with some blood in her sputum last night and minor this morning after re-starting Eliquis. Decreased sob and no chest pain today.   Objective   Blood pressure 121/85, pulse 97, temperature 98.8 F (37.1 C), temperature source Oral, resp. rate 18, height _0  (1.676 m), weight 76.2  kg, SpO2 (!) 89 %.        Intake/Output Summary (Last 24 hours) at 01/26/2022 1031 Last data filed at 01/26/2022 0034 Gross per 24 hour  Intake 200 ml  Output --  Net 200 ml   Filed Weights   01/23/22 0817  Weight: 76.2 kg    Examination: General: Pleasant, well-appearing standing next to bed. No acute  distress. CV: RRR. No murmurs, rubs, or gallops. No LE edema Pulmonary: Patient on 2L [place]. Normal effort. Decreased breath sounds on R lung field compared to left. No wheezing or rales. Abdominal: Soft, nontender, nondistended. Normal bowel sounds. Extremities: Palpable radial and DP pulses. Normal ROM. Skin: Warm and dry. No obvious rash or lesions. Neuro: A&Ox3. Moves all extremities. Normal sensation. No focal deficit. Diffussed ecchymoses on bilateral antecubital upper extremities Psych: Normal mood and affect   Resolved Hospital Problem list     Assessment & Plan:  RLL alveolar infiltrate RUL Cavitary lesion  S/p BAL on 01/24/22 Patient with hx of recurrent pneumonia in the setting of multiple courses of antimicrobial therapy, nonresolved subacute cough and new onset hemoptysis, new RUL cavitary lesion with worsening alveolar infiltrates not seen on prior imaging. S/P BAL on 8/30 cultures and cytology, pending. No WBC seen on gram stain. CBC stable. Patient was started on eliquis yesterday for superficial throbophle with minor hemoptysis. No aspiration or malignancy concerns on esophagram.  Patient okay to be discharged on antibiotics. She has been on Clindamycin and cefepime since admission; now awaiting ID recommendations for outpatient therapy since patient has been on  levofloxacin, doxycycline, azithromycin prior to this admission and has penicillin allergy associated with tongue swelling. Repeat chest XRAY stable. Now on PO amoxicillin trial; if able to tolerate it, patient should be able to go home today -continue Clindamycin and Cefepime -consult to ID for outpatient antibiotic recommendations for discharge; patient with Augmentin trial now -Bronchoscopy with BAL on 8/29; awaiting cultures -Outpatient follow up   COPD Per primary team. No increased sputum production today, on bronchodilator therapy. No evidence of COPD exacerbation -Continue Albuterol-ipratropium duonebs and  Dulera -Outpatient follow up with [name] on 9/29  Best Practice (right click and "Reselect all SmartList Selections" daily)   Diet/type: dysphagia diet (see orders) DVT prophylaxis: DOAC GI prophylaxis: PPI Lines: N/A Foley:  N/A Code Status:  full code Last date of multidisciplinary goals of care discussion [None]  Labs   CBC: Recent Labs  Lab 01/23/22 1003 01/24/22 1437 01/25/22 1319  WBC 12.1* 13.9* 14.9*  NEUTROABS 9.5*  --   --   HGB 15.3* 14.4 14.3  HCT 47.2* 43.9 43.5  MCV 94.0 93.6 92.0  PLT 362 346 404*    Basic Metabolic Panel: Recent Labs  Lab 01/23/22 0826 01/25/22 1319  NA 140 139  K 3.9 3.6  CL 103 101  CO2 29 27  GLUCOSE 106* 87  BUN 23 20  CREATININE 1.12* 1.00  CALCIUM 9.1 9.4   GFR: Estimated Creatinine Clearance: 48.4 mL/min (by C-G formula based on SCr of 1 mg/dL). Recent Labs  Lab 01/23/22 1003 01/24/22 1437 01/25/22 1319  WBC 12.1* 13.9* 14.9*    Liver Function Tests: No results for input(s): "AST", "ALT", "ALKPHOS", "BILITOT", "PROT", "ALBUMIN" in the last 168 hours. No results for input(s): "LIPASE", "AMYLASE" in the last 168 hours. No results for input(s): "AMMONIA" in the last 168 hours.  ABG No results found for: "PHART", "PCO2ART", "PO2ART", "HCO3", "TCO2", "ACIDBASEDEF", "O2SAT"   Coagulation Profile: No results for  input(s): "INR", "PROTIME" in the last 168 hours.  Cardiac Enzymes: No results for input(s): "CKTOTAL", "CKMB", "CKMBINDEX", "TROPONINI" in the last 168 hours.  HbA1C: Hgb A1c MFr Bld  Date/Time Value Ref Range Status  08/27/2019 05:07 PM 4.9 4.8 - 5.6 % Final    Comment:    (NOTE) Pre diabetes:          5.7%-6.4% Diabetes:              >6.4% Glycemic control for   <7.0% adults with diabetes   05/28/2013 05:45 AM 5.8 (H) <5.7 % Final    Comment:    (NOTE)                                                                       According to the ADA Clinical Practice Recommendations for 2011,  when HbA1c is used as a screening test:  >=6.5%   Diagnostic of Diabetes Mellitus           (if abnormal result is confirmed) 5.7-6.4%   Increased risk of developing Diabetes Mellitus References:Diagnosis and Classification of Diabetes Mellitus,Diabetes XKGY,1856,31(SHFWY 1):S62-S69 and Standards of Medical Care in         Diabetes - 2011,Diabetes OVZC,5885,02 (Suppl 1):S11-S61.    CBG: No results for input(s): "GLUCAP" in the last 168 hours.  Review of Systems:   Per interval history  Past Medical History:  She,  has a past medical history of Allergic rhinitis, Anemia due to GI blood loss, Anxiety, Asthma, Chronic airway obstruction, not elsewhere classified, Depressive disorder, not elsewhere classified, DVT (deep venous thrombosis) ([place]), Dyspnea, Esophageal reflux, Essential hypertension (08/27/2019), PE (pulmonary thromboembolism) ([place]) (01/29/2019), Pneumonia, and Wears partial dentures.   Surgical History:   Past Surgical History:  Procedure Laterality Date   ABDOMINAL HYSTERECTOMY     ABDOMINAL HYSTERECTOMY     CATARACT EXTRACTION W/ INTRAOCULAR LENS  IMPLANT, BILATERAL     CHOLECYSTECTOMY N/A 08/12/2021   Procedure: LAPAROSCOPIC CHOLECYSTECTOMY;  Surgeon: Felicie Morn, MD;  Location: [facility];  Service: General;  Laterality: N/A;   COLONOSCOPY     COLONOSCOPY WITH PROPOFOL N/A 08/20/2019   Procedure: COLONOSCOPY WITH PROPOFOL;  Surgeon: Ronald Lobo, MD;  Location: [place];  Service: Endoscopy;  Laterality: N/A;   CYSTOSCOPY     DILATION AND CURETTAGE OF UTERUS     ECTROPION REPAIR Bilateral 05/12/2021   Procedure: REPAIR OF ECTROPION BY LATERAL TARSAL STRIP OF BILATERAL LOWER EYE LIDS;  Surgeon: Delia Chimes, MD;  Location: [place];  Service: Ophthalmology;  Laterality: Bilateral;   ESOPHAGOGASTRODUODENOSCOPY (EGD) WITH PROPOFOL N/A 08/20/2019   Procedure: ESOPHAGOGASTRODUODENOSCOPY (EGD) WITH PROPOFOL;  Surgeon: Ronald Lobo, MD;  Location: [place];   Service: Endoscopy;  Laterality: N/A;   HEMOSTASIS CONTROL  08/20/2019   Procedure: HEMOSTASIS CONTROL;  Surgeon: Ronald Lobo, MD;  Location: [facility];  Service: Endoscopy;;   INTRAVASCULAR ULTRASOUND/IVUS Left 05/26/2019   Procedure: Intravascular Ultrasound/IVUS;  Surgeon: Waynetta Sandy, MD;  Location: [place] [facility];  Service: Cardiovascular;  Laterality: Left;   IVC FILTER REMOVAL N/A 03/24/2019   Procedure: IVC FILTER REMOVAL;  Surgeon: Waynetta Sandy, MD;  Location: [place] [facility];  Service: Cardiovascular;  Laterality: N/A;   LACRIMAL TUBE INSERTION Right  05/12/2021   Procedure: RIGHT NASOLACRIMAL DUCT PROBE WITH MINI MONOKA STENT PLACEMENT;  Surgeon: Delia Chimes, MD;  Location: [place];  Service: Ophthalmology;  Laterality: Right;   LESION EXCISION WITH COMPLEX REPAIR N/A 05/12/2021   Procedure: NASAL LESION EXCISION;  Surgeon: Delia Chimes, MD;  Location: [place];  Service: Ophthalmology;  Laterality: N/A;   LOWER EXTREMITY VENOGRAPHY Left 05/26/2019   Procedure: LOWER EXTREMITY VENOGRAPHY;  Surgeon: Waynetta Sandy, MD;  Location: [place] [facility];  Service: Cardiovascular;  Laterality: Left;   MULTIPLE TOOTH EXTRACTIONS     NASAL SINUS SURGERY     PERIPHERAL VASCULAR INTERVENTION Left 05/26/2019   Procedure: PERIPHERAL VASCULAR INTERVENTION;  Surgeon: Waynetta Sandy, MD;  Location: [place] [facility];  Service: Cardiovascular;  Laterality: Left;  lower extremity veins   PERIPHERAL VASCULAR THROMBECTOMY  05/26/2019   Procedure: PERIPHERAL VASCULAR THROMBECTOMY;  Surgeon: Waynetta Sandy, MD;  Location: [place] [facility];  Service: Cardiovascular;;   PTOSIS REPAIR Bilateral 05/12/2021   Procedure: EXTERNAL PTOSIS REPAIR LEVATOR ADVANCEDMENT/RESECTION OF BILATERAL UPPER EYE LIDS;  Surgeon: Delia Chimes, MD;  Location: [place];  Service: Ophthalmology;  Laterality: Bilateral;   TUBAL LIGATION     VENA CAVA  FILTER PLACEMENT N/A 01/13/2019   Procedure: INSERTION VENA-CAVA FILTER;  Surgeon: Waynetta Sandy, MD;  Location: [place];  Service: Vascular;  Laterality: N/A;     Social History:   reports that she quit smoking about 23 years ago. Her smoking use included cigarettes. She has a 10.00 pack-year smoking history. She has never used smokeless tobacco. She reports that she does not drink alcohol and does not use drugs.   Family History:  Her family history includes Coronary artery disease in her brother and mother; Diabetes in her mother; Emphysema in her father.   Allergies Allergies  Allergen Reactions   Pneumococcal Vaccines Shortness Of Breath and Other (See Comments)    Couldn't breathe    Clarithromycin Swelling   Estradiol Rash    Reaction to cream   Sulfa Antibiotics Nausea And Vomiting and Swelling   Doxycycline Hyclate     Other reaction(s): Other (See Comments), stomach distress   Penicillins Swelling    Tongue swelling Did it involve swelling of the face/tongue/throat, SOB, or low BP? Yes Did it involve sudden or severe rash/hives, skin peeling, or any reaction on the inside of your mouth or nose? Yes Did you need to seek medical attention at a hospital or doctor's office? Yes When did it last happen?  childhood If all above answers are "NO", may proceed with cephalosporin use.    Pneumococcal Polysaccharide Vaccine     Other reaction(s): Unknown   Sulfacetamide Sodium-Sulfur Other (See Comments)   Sulfonamide Derivatives Swelling     Home Medications  Prior to Admission medications   Medication Sig Start Date End Date Taking? Authorizing Provider  acetaminophen (TYLENOL) 500 MG tablet Take 500 mg by mouth every 6 (six) hours as needed for headache (pain).   Yes [provider]  albuterol (ACCUNEB) 0.63 MG/3ML nebulizer solution Take 1 ampule by nebulization every 6 (six) hours as needed for shortness of breath or wheezing. 11/14/21  Yes [provider]  albuterol (VENTOLIN HFA) 108 (90 Base) MCG/ACT inhaler Inhale 1-2 puffs into the lungs every 6 (six) hours as needed for wheezing or shortness of breath.   Yes [provider]  aspirin EC 81 MG tablet Take 81 mg by mouth daily. Swallow whole.   Yes  [provider]  ELIQUIS 5 MG TABS tablet Take 5 mg by mouth 2 (two) times daily. 12/01/21  Yes [provider]  fluticasone-salmeterol (ADVAIR) 250-50 MCG/ACT AEPB Inhale 1 puff then rinse mouth, twice daily Patient taking differently: Inhale 1 puff into the lungs in the morning and at bedtime. Inhale 1 puff then rinse mouth, twice daily 09/29/20  Yes Young, Clinton D, MD  LORazepam (ATIVAN) 1 MG tablet Take 1 mg by mouth at bedtime.   Yes [provider]  omeprazole (PRILOSEC) 20 MG capsule Take 20 mg by mouth daily. 03/26/20  Yes [provider]  traZODone (DESYREL) 100 MG tablet Take 100 mg by mouth at bedtime.   Yes [provider]  triamterene-hydrochlorothiazide (MAXZIDE-25) 37.5-25 MG tablet Take 0.5 tablets by mouth daily. 02/06/19  Yes [provider]  cetirizine (ZYRTEC) 10 MG tablet Take 10 mg by mouth daily as needed for allergies. Patient not taking: Reported on 01/23/2022    [provider]  doxycycline (VIBRA-TABS) 100 MG tablet Take 1 tablet (100 mg total) by mouth 2 (two) times daily. Patient not taking: Reported on 01/23/2022 12/08/21   Baird Lyons D, MD  escitalopram (LEXAPRO) 20 MG tablet Take 20 mg by mouth daily. Patient not taking: Reported on 01/23/2022    [provider]  levofloxacin (LEVAQUIN) 750 MG tablet Take 1 tablet (750 mg total) by mouth daily. Patient not taking: Reported on 01/23/2022 12/27/21   Deneise Lever, MD  theophylline (THEO-24) 400 MG 24 hr capsule Take 1 capsule (400 mg total) by mouth daily. Patient not taking: Reported on 01/23/2022 11/24/21   Deneise Lever, MD  theophylline (UNIPHYL) 400 MG 24 hr tablet Take 400  mg by mouth daily. Patient not taking: Reported on 01/23/2022 12/23/21   [provider]     Romana Juniper, MD 01/26/22

## 2022-01-26 NOTE — Telephone Encounter (Signed)
Pt previously scheduled for 75mofu with CY on 9/29. Amended notes to reflect that hospitalization should be discussed as well.

## 2022-01-27 ENCOUNTER — Other Ambulatory Visit (HOSPITAL_COMMUNITY): Payer: Self-pay

## 2022-01-27 ENCOUNTER — Telehealth: Payer: Self-pay | Admitting: Pulmonary Disease

## 2022-01-27 LAB — RESPIRATORY CULTURE OR RESPIRATORY AND SPUTUM CULTURE
MICRO NUMBER:: 13731848
RESULT:: NORMAL
SPECIMEN QUALITY:: ADEQUATE

## 2022-01-27 LAB — FUNGUS CULTURE W SMEAR
CULTURE:: NO GROWTH
MICRO NUMBER:: 13731847
SMEAR:: NONE SEEN
SPECIMEN QUALITY:: ADEQUATE

## 2022-01-27 NOTE — Telephone Encounter (Signed)
She has  follow up with the provider. Nothing further needed.

## 2022-01-27 NOTE — Telephone Encounter (Signed)
PAtient called to schedule a hospital follow up. She stated that she did not want to see [name], she wants to see Dr. Lake Bells since she saw him in the hospital. Explained to her that Dr. Lake Bells didn't have any openings and she just kept saying she does not want to see [name] for a hospital follow up

## 2022-01-28 ENCOUNTER — Encounter (HOSPITAL_COMMUNITY): Payer: Self-pay | Admitting: Pulmonary Disease

## 2022-01-28 LAB — CULTURE, BLOOD (ROUTINE X 2)
Culture: NO GROWTH
Culture: NO GROWTH
Special Requests: ADEQUATE

## 2022-01-29 LAB — ANAEROBIC CULTURE W GRAM STAIN: Gram Stain: NONE SEEN

## 2022-01-30 ENCOUNTER — Encounter: Payer: Self-pay | Admitting: Pulmonary Disease

## 2022-01-30 DIAGNOSIS — R042 Hemoptysis: Secondary | ICD-10-CM

## 2022-01-31 ENCOUNTER — Telehealth: Payer: Self-pay | Admitting: Pulmonary Disease

## 2022-01-31 NOTE — Telephone Encounter (Signed)
Called the pt and there was no answer- LMTCB    

## 2022-01-31 NOTE — Telephone Encounter (Signed)
She has an OV on 02/02/22  with the provider and will get the CXR prior to the visit. Nothing further needed,.

## 2022-01-31 NOTE — Telephone Encounter (Signed)
She only wants to see Dr. Lake Bells and I have sent him a message. She is not having any other symptoms at this time. She is having some blood come up at different times and wants to know if this is normal or if she will need to be evaluated in the office. She only wants to see Dr. Lake Bells.  Please advise.

## 2022-01-31 NOTE — Telephone Encounter (Signed)
duplicate

## 2022-02-01 NOTE — Telephone Encounter (Signed)
See other note. She has a follow up on 02/02/22. Nothing further needed.

## 2022-02-02 ENCOUNTER — Ambulatory Visit: Payer: PPO | Admitting: Pulmonary Disease

## 2022-02-02 ENCOUNTER — Ambulatory Visit (INDEPENDENT_AMBULATORY_CARE_PROVIDER_SITE_OTHER): Payer: PPO

## 2022-02-02 ENCOUNTER — Encounter: Payer: Self-pay | Admitting: Pulmonary Disease

## 2022-02-02 VITALS — BP 130/84 | HR 100 | Ht 66.0 in | Wt 161.0 lb

## 2022-02-02 DIAGNOSIS — J449 Chronic obstructive pulmonary disease, unspecified: Secondary | ICD-10-CM

## 2022-02-02 DIAGNOSIS — J851 Abscess of lung with pneumonia: Secondary | ICD-10-CM

## 2022-02-02 DIAGNOSIS — R042 Hemoptysis: Secondary | ICD-10-CM

## 2022-02-02 DIAGNOSIS — J9611 Chronic respiratory failure with hypoxia: Secondary | ICD-10-CM | POA: Diagnosis not present

## 2022-02-02 NOTE — Patient Instructions (Signed)
Lung abscess and right lower lobe: Keep taking Augmentin as you are doing Increase food intake is much as you can Take boost or Ensure 2 bottles a day Stay physically active blood work today: CBC, basic metabolic panel  COPD: Continue Advair twice a day  Chronic respiratory failure with hypoxemia: Continue 4 L continuously  Follow-up next Tuesday with Tammy Parrett

## 2022-02-02 NOTE — Progress Notes (Signed)
Synopsis: Followed by [name] for many years for COPD and chronic respiratory failure with hypoxemia, in 2023 had a series of COPD exacerbations and by August 2023 had a right lower lobe lung abscess noted on CT scanning  Subjective:   PATIENT ID: [name] GENDER: female DOB: 02-21-1941, MRN: [id number]   HPI  Chief Complaint  Patient presents with   Follow-up    Coughing up blood    Pleasant 81 year old female with severe COPD and chronic respiratory failure with hypoxemia who follows up with me in clinic today after she was found to have a superior segment of the right lower lobe pulmonary abscess and a hospitalization a week ago.  During that hospital visit we performed a bronchoscopy.  We also stopped her Eliquis which had been stopped previously by Dr. Gwenlyn Saran.  Since going home she says she still coughing up very dark, "old blood".  She is not having fevers but sometimes she may feel a chill.  Her weight has been stable.  Her appetite is not great and she says she is eating a few meals a day.  Her shortness of breath is the same as it was when she was previously hospitalized.  In general she still feels overall weak, somewhat short of breath, but things are not worsening.  She continues to take the Augmentin.  Past Medical History:  Diagnosis Date   Allergic rhinitis    Anemia due to GI blood loss    Anxiety    Asthma    Chronic airway obstruction, not elsewhere classified    Depressive disorder, not elsewhere classified    DVT (deep venous thrombosis) (HCC)    LLE DVT 01/13/19   Dyspnea    Esophageal reflux    Essential hypertension 08/27/2019   PE (pulmonary thromboembolism) ([place]) 01/29/2019   Pneumonia    Wears partial dentures      Family History  Problem Relation Age of Onset   Diabetes Mother    Coronary artery disease Mother    Emphysema Father    Coronary artery disease Brother      Social History   Socioeconomic History   Marital status: Single     Spouse name: Not on file   Number of children: Not on file   Years of education: Not on file   Highest education level: Not on file  Occupational History   Occupation: Disabled  Tobacco Use   Smoking status: Former    Packs/day: 0.25    Years: 40.00    Total pack years: 10.00    Types: Cigarettes    Quit date: 2000    Years since quitting: 23.6   Smokeless tobacco: Never  Vaping Use   Vaping Use: Never used  Substance and Sexual Activity   Alcohol use: No   Drug use: No   Sexual activity: Not on file  Other Topics Concern   Not on file  Social History Narrative   Not on file   Social Determinants of Health   Financial Resource Strain: Not on file  Food Insecurity: Not on file  Transportation Needs: Not on file  Physical Activity: Not on file  Stress: Not on file  Social Connections: Not on file  Intimate Partner Violence: Not on file     Allergies  Allergen Reactions   Pneumococcal Vaccines Shortness Of Breath and Other (See Comments)    Couldn't breathe    Clarithromycin Swelling   Estradiol Rash    Reaction to cream  Sulfa Antibiotics Nausea And Vomiting and Swelling   Doxycycline Hyclate     Other reaction(s): Other (See Comments), stomach distress   Pneumococcal Polysaccharide Vaccine     Other reaction(s): Unknown   Sulfacetamide Sodium-Sulfur Other (See Comments)   Sulfonamide Derivatives Swelling     Outpatient Medications Prior to Visit  Medication Sig Dispense Refill   acetaminophen (TYLENOL) 500 MG tablet Take 500 mg by mouth every 6 (six) hours as needed for headache (pain).     albuterol (ACCUNEB) 0.63 MG/3ML nebulizer solution Take 1 ampule by nebulization every 6 (six) hours as needed for shortness of breath or wheezing.     albuterol (VENTOLIN HFA) 108 (90 Base) MCG/ACT inhaler Inhale 1-2 puffs into the lungs every 6 (six) hours as needed for wheezing or shortness of breath.     amoxicillin-clavulanate (AUGMENTIN) 875-125 MG tablet Take 1  tablet by mouth 2 (two) times daily for 21 days. 42 tablet 0   aspirin EC 81 MG tablet Take 81 mg by mouth daily. Swallow whole.     fluticasone-salmeterol (ADVAIR) 250-50 MCG/ACT AEPB Inhale 1 puff then rinse mouth, twice daily (Patient taking differently: Inhale 1 puff into the lungs in the morning and at bedtime. Inhale 1 puff then rinse mouth, twice daily) 180 each 4   LORazepam (ATIVAN) 1 MG tablet Take 1 mg by mouth at bedtime.     omeprazole (PRILOSEC) 20 MG capsule Take 20 mg by mouth daily.     traZODone (DESYREL) 100 MG tablet Take 100 mg by mouth at bedtime.     triamterene-hydrochlorothiazide (MAXZIDE-25) 37.5-25 MG tablet Take 0.5 tablets by mouth daily.     No facility-administered medications prior to visit.    ROS Gen: per HPI HEENT: Denies blurred vision, double vision, hearing loss, tinnitus, sinus congestion, rhinorrhea, sore throat, neck stiffness, dysphagia PULM: per HPI CV: Denies chest pain, edema, orthopnea, paroxysmal nocturnal dyspnea, palpitations GI: Denies abdominal pain, nausea, vomiting, diarrhea, hematochezia, melena, constipation, change in bowel habits GU: Denies dysuria, hematuria, polyuria, oliguria, urethral discharge Endocrine: Denies hot or cold intolerance, polyuria, polyphagia or appetite change Derm: Denies rash, dry skin, scaling or peeling skin change Heme: Denies easy bruising, bleeding, bleeding gums Neuro: Denies headache, numbness, weakness, slurred speech, loss of memory or consciousness    Objective:  Physical Exam   Vitals:   02/02/22 1553  BP: 130/84  Pulse: 100  SpO2: 92%  Weight: 161 lb (73 kg)  Height: _0  (1.676 m)   4L   Gen: Chronically ill appearing, in a wheelchair, no acute distress HENT: NCAT, OP clear, neck supple without masses Eyes: PERRL, EOMi Lymph: no cervical lymphadenopathy PULM: CTA B CV: RRR, no mgr, no JVD GI: BS+, soft, nontender, no hsm Derm: no rash or skin breakdown MSK: normal bulk and  tone Neuro: A&Ox4, CN II-XII intact, MAEW Psyche: normal mood and affect   CBC    Component Value Date/Time   WBC 14.9 (H) 01/25/2022 1319   RBC 4.73 01/25/2022 1319   HGB 14.3 01/25/2022 1319   HCT 43.5 01/25/2022 1319   PLT 404 (H) 01/25/2022 1319   MCV 92.0 01/25/2022 1319   MCH 30.2 01/25/2022 1319   MCHC 32.9 01/25/2022 1319   RDW 14.1 01/25/2022 1319   LYMPHSABS 1.2 01/23/2022 1003   MONOABS 1.3 (H) 01/23/2022 1003   EOSABS 0.0 01/23/2022 1003   BASOSABS 0.0 01/23/2022 1003     Chest imaging: February 02, 2022 chest x-ray images independently reviewed showing a stable  right lower lobe air-fluid level in a likely abscess.  Radiology mentions a second 2.3 cm nodule in the right lower lobe, less conspicuous  PFT: 2015 spiro FEV1/FVC 0.51 FVC 2.13/ 65%, FEV1 1.08/ 44% 2018 spiro ratio 0.43 FEV1 1.04/44%,   Labs: 01/25/2022 CBC WBC 14.9, Hgb 14.3 gm/dL  Path:  Echo:  Heart Catheterization:      Assessment & Plan:   Abscess of lower lobe of right lung with pneumonia ([place]) - Plan: CBC w/Diff, Basic Metabolic Panel (BMET), Basic Metabolic Panel (BMET), CBC w/Diff  COPD mixed type ([place])  Chronic respiratory failure with hypoxia ([place])  Discussion: This is a very difficult problem for [place].  She has a lung abscess of undetermined etiology, at this point no clear evidence of fungal or AFB causes.  She does not have evidence of aspiration.  She seems stable from a clinical standpoint in terms of energy level, breathlessness since discharge from hospital on Augmentin.  Radiology notes a possible lesion in the right lower lobe, I am having a harder time seeing this.  Right now I think the best approach is to continue antibiotics with Augmentin and to continue to follow the BAL culture.  However, we need to keep a really close eye on her.  I encouraged her to push her nutrition.  While I thoracoscopy could provide definitive therapy, she is so high risk that I worry that that  would leave her debilitated for life if we were to refer for that.  However, if her condition worsens we may have to consider that.  I explained that to her and her niece in detail today, questions answered.  Lung abscess and right lower lobe: Keep taking Augmentin as you are doing Increase food intake is much as you can Take boost or Ensure 2 bottles a day Stay physically active blood work today: CBC, basic metabolic panel  COPD: Continue Advair twice a day  Chronic respiratory failure with hypoxemia: Continue 4 L continuously  Follow-up next Tuesday with Tammy Parrett  Immunizations: Immunization History  Administered Date(s) Administered   Fluad Quad(high Dose 65+) 01/10/2021   Influenza Split 02/27/2011, 02/27/2012, 02/26/2013, 02/26/2014, 02/27/2016, 03/25/2017   Influenza Whole 02/07/2009   Influenza, High Dose Seasonal PF 03/17/2014, 02/27/2017, 03/27/2018   Influenza, Quadrivalent, Recombinant, Inj, Pf 02/25/2018, 04/08/2019, 03/05/2020, 02/11/2021   Influenza-Unspecified 01/27/2013, 03/05/2015, 03/05/2020   PFIZER(Purple Top)SARS-COV-2 Vaccination 07/12/2019, 10/06/2019   Tdap 10/07/2013     Current Outpatient Medications:    acetaminophen (TYLENOL) 500 MG tablet, Take 500 mg by mouth every 6 (six) hours as needed for headache (pain)., Disp: , Rfl:    albuterol (ACCUNEB) 0.63 MG/3ML nebulizer solution, Take 1 ampule by nebulization every 6 (six) hours as needed for shortness of breath or wheezing., Disp: , Rfl:    albuterol (VENTOLIN HFA) 108 (90 Base) MCG/ACT inhaler, Inhale 1-2 puffs into the lungs every 6 (six) hours as needed for wheezing or shortness of breath., Disp: , Rfl:    amoxicillin-clavulanate (AUGMENTIN) 875-125 MG tablet, Take 1 tablet by mouth 2 (two) times daily for 21 days., Disp: 42 tablet, Rfl: 0   aspirin EC 81 MG tablet, Take 81 mg by mouth daily. Swallow whole., Disp: , Rfl:    fluticasone-salmeterol (ADVAIR) 250-50 MCG/ACT AEPB, Inhale 1 puff  then rinse mouth, twice daily (Patient taking differently: Inhale 1 puff into the lungs in the morning and at bedtime. Inhale 1 puff then rinse mouth, twice daily), Disp: 180 each, Rfl: 4   LORazepam (ATIVAN) 1  MG tablet, Take 1 mg by mouth at bedtime., Disp: , Rfl:    omeprazole (PRILOSEC) 20 MG capsule, Take 20 mg by mouth daily., Disp: , Rfl:    traZODone (DESYREL) 100 MG tablet, Take 100 mg by mouth at bedtime., Disp: , Rfl:    triamterene-hydrochlorothiazide (MAXZIDE-25) 37.5-25 MG tablet, Take 0.5 tablets by mouth daily., Disp: , Rfl:

## 2022-02-03 LAB — CBC WITH DIFFERENTIAL/PLATELET
Basophils Absolute: 0.1 10*3/uL (ref 0.0–0.1)
Basophils Relative: 0.9 % (ref 0.0–3.0)
Eosinophils Absolute: 0 10*3/uL (ref 0.0–0.7)
Eosinophils Relative: 0.3 % (ref 0.0–5.0)
HCT: 43.5 % (ref 36.0–46.0)
Hemoglobin: 14.2 g/dL (ref 12.0–15.0)
Lymphocytes Relative: 10.4 % — ABNORMAL LOW (ref 12.0–46.0)
Lymphs Abs: 0.9 10*3/uL (ref 0.7–4.0)
MCHC: 32.6 g/dL (ref 30.0–36.0)
MCV: 91.8 fl (ref 78.0–100.0)
Monocytes Absolute: 0.6 10*3/uL (ref 0.1–1.0)
Monocytes Relative: 6.7 % (ref 3.0–12.0)
Neutro Abs: 7.1 10*3/uL (ref 1.4–7.7)
Neutrophils Relative %: 81.7 % — ABNORMAL HIGH (ref 43.0–77.0)
Platelets: 359 10*3/uL (ref 150.0–400.0)
RBC: 4.73 Mil/uL (ref 3.87–5.11)
RDW: 14.3 % (ref 11.5–15.5)
WBC: 8.7 10*3/uL (ref 4.0–10.5)

## 2022-02-03 LAB — BASIC METABOLIC PANEL
BUN: 21 mg/dL (ref 6–23)
CO2: 36 mEq/L — ABNORMAL HIGH (ref 19–32)
Calcium: 9.1 mg/dL (ref 8.4–10.5)
Chloride: 99 mEq/L (ref 96–112)
Creatinine, Ser: 0.89 mg/dL (ref 0.40–1.20)
GFR: 61.89 mL/min (ref >60.00)
Glucose, Bld: 91 mg/dL (ref 70–99)
Potassium: 3.8 mEq/L (ref 3.5–5.1)
Sodium: 139 mEq/L (ref 135–145)

## 2022-02-07 ENCOUNTER — Ambulatory Visit: Payer: PPO | Admitting: Adult Health

## 2022-02-07 ENCOUNTER — Ambulatory Visit (INDEPENDENT_AMBULATORY_CARE_PROVIDER_SITE_OTHER): Payer: PPO

## 2022-02-07 ENCOUNTER — Encounter: Payer: Self-pay | Admitting: Adult Health

## 2022-02-07 VITALS — BP 136/74 | HR 62 | Temp 97.8°F | Ht 66.0 in | Wt 161.4 lb

## 2022-02-07 DIAGNOSIS — J9611 Chronic respiratory failure with hypoxia: Secondary | ICD-10-CM | POA: Insufficient documentation

## 2022-02-07 DIAGNOSIS — R042 Hemoptysis: Secondary | ICD-10-CM | POA: Insufficient documentation

## 2022-02-07 DIAGNOSIS — J984 Other disorders of lung: Secondary | ICD-10-CM

## 2022-02-07 DIAGNOSIS — J441 Chronic obstructive pulmonary disease with (acute) exacerbation: Secondary | ICD-10-CM | POA: Diagnosis not present

## 2022-02-07 DIAGNOSIS — J189 Pneumonia, unspecified organism: Secondary | ICD-10-CM | POA: Diagnosis not present

## 2022-02-07 DIAGNOSIS — J851 Abscess of lung with pneumonia: Secondary | ICD-10-CM

## 2022-02-07 MED ORDER — BREZTRI AEROSPHERE 160-9-4.8 MCG/ACT IN AERO: 2.0000 | INHALATION_SPRAY | Freq: Two times a day (BID) | RESPIRATORY_TRACT | 0 refills | Status: DC

## 2022-02-07 NOTE — Progress Notes (Signed)
_0  ID: [name], female    DOB: 10-22-1940, 81 y.o.   MRN: [id number]  Chief Complaint  Patient presents with   Follow-up    Referring provider: [name], [name]  HPI: 81 year old female former smoker followed for severe COPD and chronic respiratory failure on oxygen Medical history significant for VTE.   TEST/EVENTS :  CT chest and November 17, 2021 showed emphysema no suspicious pulmonary nodules or masses.  No consolidation.   02/07/2022 Follow up : Lung abscess Patient presents for 1 week follow-up.  Patient was seen last visit in pulmonary clinic after recent hospitalization for right lower lobe lung abscess and hemoptysis.  CT chest January 23, 2022  showed new large infiltrate in the right lower lobe with cavitary lesion.  Negative for PE.  Barium esophagram no aspiration or stricture.  Patient underwent bronchoscopy during hospitalization.  Culture was negative .  She comes in today  Fungal cultures to date are negative finals are pending, Anaerobic neg .  Patient was treated with IV antibiotics and discharged on prolonged course of Augmentin.  She has 8 additional days of Augmentin left.  Her Eliquis has been held due to hemoptysis.  Bloody mucus is improving , minimal blood mixed in with mucus. Eating okay. No n/v/d. No choking or dysphagia.  Was admitted in June for COPD exacerbation.  CT chest during that admission showed no suspicious nodules or masses.  No consolidation.  Negative for PE. Chest xray today shows interval decrease in right mid and right lower lobe cavitary lesions.   Allergies  Allergen Reactions   Pneumococcal Vaccines Shortness Of Breath and Other (See Comments)    Couldn't breathe    Clarithromycin Swelling   Estradiol Rash    Reaction to cream   Sulfa Antibiotics Nausea And Vomiting and Swelling   Doxycycline Hyclate     Other reaction(s): Other (See Comments), stomach distress   Pneumococcal Polysaccharide Vaccine     Other  reaction(s): Unknown   Sulfacetamide Sodium-Sulfur Other (See Comments)   Sulfonamide Derivatives Swelling    Immunization History  Administered Date(s) Administered   Fluad Quad(high Dose 65+) 01/10/2021   Influenza Split 02/27/2011, 02/27/2012, 02/26/2013, 02/26/2014, 02/27/2016, 03/25/2017   Influenza Whole 02/07/2009   Influenza, High Dose Seasonal PF 03/17/2014, 02/27/2017, 03/27/2018   Influenza, Quadrivalent, Recombinant, Inj, Pf 02/25/2018, 04/08/2019, 03/05/2020, 02/11/2021   Influenza-Unspecified 01/27/2013, 03/05/2015, 03/05/2020   PFIZER(Purple Top)SARS-COV-2 Vaccination 07/12/2019, 10/06/2019   Tdap 10/07/2013    Past Medical History:  Diagnosis Date   Allergic rhinitis    Anemia due to GI blood loss    Anxiety    Asthma    Chronic airway obstruction, not elsewhere classified    Depressive disorder, not elsewhere classified    DVT (deep venous thrombosis) ([place])    LLE DVT 01/13/19   Dyspnea    Esophageal reflux    Essential hypertension 08/27/2019   PE (pulmonary thromboembolism) ([place]) 01/29/2019   Pneumonia    Wears partial dentures     Tobacco History: Social History   Tobacco Use  Smoking Status Former   Packs/day: 0.25   Years: 40.00   Total pack years: 10.00   Types: Cigarettes   Quit date: 2000   Years since quitting: 23.7  Smokeless Tobacco Never   Counseling given: Not Answered   Outpatient Medications Prior to Visit  Medication Sig Dispense Refill   acetaminophen (TYLENOL) 500 MG tablet Take 500 mg by mouth every 6 (six) hours as needed for headache (  pain).     albuterol (ACCUNEB) 0.63 MG/3ML nebulizer solution Take 1 ampule by nebulization every 6 (six) hours as needed for shortness of breath or wheezing.     albuterol (VENTOLIN HFA) 108 (90 Base) MCG/ACT inhaler Inhale 1-2 puffs into the lungs every 6 (six) hours as needed for wheezing or shortness of breath.     amoxicillin-clavulanate (AUGMENTIN) 875-125 MG tablet Take 1 tablet by mouth  2 (two) times daily for 21 days. 42 tablet 0   aspirin EC 81 MG tablet Take 81 mg by mouth daily. Swallow whole.     fluticasone-salmeterol (ADVAIR) 250-50 MCG/ACT AEPB Inhale 1 puff then rinse mouth, twice daily (Patient taking differently: Inhale 1 puff into the lungs in the morning and at bedtime. Inhale 1 puff then rinse mouth, twice daily) 180 each 4   LORazepam (ATIVAN) 1 MG tablet Take 1 mg by mouth at bedtime.     omeprazole (PRILOSEC) 20 MG capsule Take 20 mg by mouth daily.     traZODone (DESYREL) 100 MG tablet Take 100 mg by mouth at bedtime.     triamterene-hydrochlorothiazide (MAXZIDE-25) 37.5-25 MG tablet Take 0.5 tablets by mouth daily.     No facility-administered medications prior to visit.     Review of Systems:   Constitutional:   No  weight loss, night sweats,  Fevers, chills,  +fatigue, or  lassitude.  HEENT:   No headaches,  Difficulty swallowing,  Tooth/dental problems, or  Sore throat,                No sneezing, itching, ear ache, nasal congestion, post nasal drip,   CV:  No chest pain,  Orthopnea, PND, swelling in lower extremities, anasarca, dizziness, palpitations, syncope.   GI  No heartburn, indigestion, abdominal pain, nausea, vomiting, diarrhea, change in bowel habits, loss of appetite, bloody stools.   Resp:  No chest wall deformity  Skin: no rash or lesions.  GU: no dysuria, change in color of urine, no urgency or frequency.  No flank pain, no hematuria   MS:  No joint pain or swelling.  No decreased range of motion.  No back pain.    Physical Exam  BP 136/74 (BP Location: Right Arm, Patient Position: Sitting, Cuff Size: Normal)   Pulse 62   Temp 97.8 F (36.6 C) (Oral)   Ht _0  (1.676 m)   Wt 161 lb 6.4 oz (73.2 kg)   SpO2 94%   BMI 26.05 kg/m   GEN: A/Ox3; pleasant , NAD, elderly, in wheelchair, on oxygen   HEENT:  [place]/AT,  NOSE-clear, THROAT-clear, no lesions, no postnasal drip or exudate noted.   NECK:  Supple w/ fair ROM; no  JVD; normal carotid impulses w/o bruits; no thyromegaly or nodules palpated; no lymphadenopathy.    RESP few scattered rhonchi and expiratory wheezes.  Speaks in full sentences.   no accessory muscle use, no dullness to percussion  CARD:  RRR, no m/r/g, no peripheral edema, pulses intact, no cyanosis or clubbing.  GI:   Soft & nt; nml bowel sounds; no organomegaly or masses detected.   Musco: Warm bil, no deformities or joint swelling noted.   Neuro: alert, no focal deficits noted.    Skin: Warm, no lesions or rashes    Lab Results:  CBC    Component Value Date/Time   WBC 8.7 02/02/2022 1638   RBC 4.73 02/02/2022 1638   HGB 14.2 02/02/2022 1638   HCT 43.5 02/02/2022 1638   PLT 359.0 02/02/2022  1638   MCV 91.8 02/02/2022 1638   MCH 30.2 01/25/2022 1319   MCHC 32.6 02/02/2022 1638   RDW 14.3 02/02/2022 1638   LYMPHSABS 0.9 02/02/2022 1638   MONOABS 0.6 02/02/2022 1638   EOSABS 0.0 02/02/2022 1638   BASOSABS 0.1 02/02/2022 1638    BMET    Component Value Date/Time   NA 139 02/02/2022 1638   K 3.8 02/02/2022 1638   CL 99 02/02/2022 1638   CO2 36 (H) 02/02/2022 1638   GLUCOSE 91 02/02/2022 1638   BUN 21 02/02/2022 1638   CREATININE 0.89 02/02/2022 1638   CREATININE 0.80 07/28/2019 1014   CALCIUM 9.1 02/02/2022 1638   GFRNONAA 58 (L) 01/25/2022 1319   GFRNONAA >60 07/28/2019 1014   GFRAA >60 11/22/2019 0603   GFRAA >60 07/28/2019 1014    BNP    Component Value Date/Time   BNP 22.6 11/16/2021 1302    ProBNP No results found for: "PROBNP"  Imaging: DG Chest 2 View  Result Date: 02/07/2022 CLINICAL DATA:  Pneumonia EXAM: CHEST - 2 VIEW COMPARISON:  Previous chest radiographs done on 02/02/2022 and CT done on 01/23/2022 FINDINGS: There is a cavitary lesion with air-fluid level in the superior segment of right lower lobe measuring 11 cm in maximum diameter. There is a small 1.7 cm structure with air-fluid level in right lower lung field. These loculated fluid  collections appear smaller. There is patchy alveolar density in the right parahilar region suggesting pneumonia in right lower lobe. Low position of diaphragms suggests COPD. No new focal infiltrates are seen in left lung. There is no pneumothorax. IMPRESSION: There is interval decrease in size of cavitary lesions in right mid and right lower lung fields. There is patchy infiltrate in right lower lobe. No new infiltrates are seen. Electronically Signed   By: Elmer Picker M.D.   On: 02/07/2022 10:44   DG Chest 2 View  Result Date: 02/02/2022 CLINICAL DATA:  Hemoptysis. EXAM: CHEST - 2 VIEW COMPARISON:  Chest radiograph 01/26/2022; CT chest 01/23/2022 FINDINGS: The heart size and mediastinal contours are within normal limits. Large cavitary lesion with air-fluid level in the right upper lung measures approximately 11.3 cm transverse dimension, similar to prior CT chest 01/15/2022. A nodular opacity spanning approximately 2.3 cm with possible air-fluid level at the right lung base is new compared to prior exam. The extensive airspace opacities previously seen in the right lung on prior CT are significantly improved. Left lung is clear. Thin metallic wire overlying the left lower lobe pulmonary artery is unchanged. Breast augmentation. IMPRESSION: 1. Cavitary lesion with air-fluid level in the right upper lung posteriorly is stable compared to prior CT 01/15/2022. 2. A 2.3 cm nodular opacity at the right lung base with possible air-fluid level may represent a second cavitary lesion. This could be further evaluated with CT chest. 3. The airspace disease previously seen in the right lung is significantly improved compared to prior CT 01/23/2022. Electronically Signed   By: Ileana Roup M.D.   On: 02/02/2022 16:11   DG Chest 2 View  Result Date: 01/26/2022 CLINICAL DATA:  Hemoptysis with cough. EXAM: CHEST - 2 VIEW COMPARISON:  CTA chest 01/23/2022 FINDINGS: Lungs are hyperexpanded. Cavitary lesion with  air-fluid level in the right mid lung was better characterized on recent chest CTA. Patchy airspace disease noted in the lung bases, right greater than left. No pleural effusion. Telemetry leads overlie the chest. IMPRESSION: Cavitary lesion in the right mid lung with air-fluid level, as noted  on recent chest CTA. Right greater than left patchy airspace disease in the lung bases without pleural effusion. Electronically Signed   By: Misty Stanley M.D.   On: 01/26/2022 12:17   DG ESOPHAGUS W SINGLE CM (SOL OR THIN BA)  Result Date: 01/25/2022 CLINICAL DATA:  Patient with a history of dysphagia. Patient complains of feeling like solids do not go down, coughing after eating solid foods, and regurgitation if she eats too much at once. Barium study requested. EXAM: ESOPHAGUS/BARIUM SWALLOW/TABLET STUDY TECHNIQUE: Single contrast examination was performed using thin liquid barium. This exam was performed by Soyla Dryer, NP, and was supervised and interpreted by Dr. Maree Erie. FLUOROSCOPY: Radiation Exposure Index (as provided by the fluoroscopic device): 21.10 mGy Kerma COMPARISON:  None Available. FINDINGS: Swallowing: Appears normal. No vestibular penetration or aspiration seen. Pharynx: Unremarkable. Esophagus: Normal appearance. Esophageal motility: Poor primary peristalsis with tertiary contractions observed Hiatal Hernia: Small-moderate sized hiatal hernia observed Gastroesophageal reflux: None visualized. Ingested 57m barium tablet: Passed into the hiatal hernia Other: None. IMPRESSION: No aspiration observed. No stricture or mass seen in the esophagus. Poor primary peristalsis with tertiary contractions. Small to moderate-sized hiatal hernia present. 13 mm barium tablet passed easily into the hiatal hernia. Patient tolerated procedure well with no coughing or other complaints. Read by: JSoyla Dryer NP Electronically Signed   By: MNelson ChimesM.D.   On: 01/25/2022 11:35   CT Angio Chest PE W and/or Wo  Contrast  Result Date: 01/23/2022 CLINICAL DATA:  Chest pain, cough, hemoptysis EXAM: CT ANGIOGRAPHY CHEST WITH CONTRAST TECHNIQUE: Multidetector CT imaging of the chest was performed using the standard protocol during bolus administration of intravenous contrast. Multiplanar CT image reconstructions and MIPs were obtained to evaluate the vascular anatomy. RADIATION DOSE REDUCTION: This exam was performed according to the departmental dose-optimization program which includes automated exposure control, adjustment of the mA and/or kV according to patient size and/or use of iterative reconstruction technique. CONTRAST:  837mOMNIPAQUE IOHEXOL 350 MG/ML SOLN COMPARISON:  Previous studies including the CT done on 06Jul 16, 2023nd chest radiographs done today FINDINGS: Cardiovascular: There is no evidence of pulmonary artery embolism. There is ectasia of the main pulmonary artery measuring 3.5 cm suggesting pulmonary arterial hypertension. There is homogeneous enhancement in thoracic aorta. There is ectasia of ascending thoracic aorta measuring 4 cm. Coronary artery calcifications are seen. Mediastinum/Nodes: No significant lymphadenopathy is seen. Lungs/Pleura: There is interval appearance of large patchy alveolar infiltrate in right lower lobe suggesting pneumonia. There is 8.7 x 5 cm cavitary lesion with air-fluid level in superior segment of right lower lobe. Rest of the lung fields show no focal infiltrates. There is no significant pleural effusion or pneumothorax. Upper Abdomen: No acute findings are seen. Musculoskeletal: Visualized bony structures are unremarkable. Augmentation/reconstruction prostheses are seen in both breasts. Review of the MIP images confirms the above findings. IMPRESSION: There is new large alveolar infiltrate in right lower lobe suggesting pneumonia. There is a large cavitary lesion with air-fluid level in the superior segment of right lower lobe suggesting possible lung abscess. There is  no evidence of pulmonary artery embolism. There is ectasia of main pulmonary artery suggesting pulmonary arterial hypertension. There is no evidence of thoracic aortic dissection. Coronary artery calcifications are seen. There is 4 cm ascending thoracic aneurysm with no significant change. Recommend annual imaging followup by CTA or MRA. This recommendation follows 2010 ACCF/AHA/AATS/ACR/ASA/SCA/SCAI/SIR/STS/SVM Guidelines for the Diagnosis and Management of Patients with Thoracic Aortic Disease. Circulation. 2010; 121: : [id number] aneurysm NOS (ICD10-I71.9)  Electronically Signed   By: Elmer Picker M.D.   On: 01/23/2022 13:02   DG Chest 2 View  Result Date: 01/23/2022 CLINICAL DATA:  Shortness of breath.  Recent diagnosis of pneumonia. EXAM: CHEST - 2 VIEW COMPARISON:  CT 12/12/2021 FINDINGS: Normal heart size. No pleural effusion identified. There is diffuse airspace disease throughout the right lower lobe, most severe within the superior segment. Left lung appears clear. Visualized osseous structures are unremarkable. IMPRESSION: 1. New right lower lobe airspace disease compatible with pneumonia. Followup PA and lateral chest X-ray is recommended in 3-4 weeks following trial of antibiotic therapy to ensure resolution and exclude underlying malignancy. Electronically Signed   By: Kerby Moors M.D.   On: 01/23/2022 08:50          No data to display          No results found for: "NITRICOXIDE"      Assessment & Plan:   COPD with acute exacerbation ([place]) Recurrent COPD exacerbation.-We will change from Advair to [facility] . Hold on steroids at this time.  Continue with mucociliary clearance.  May use albuterol nebs as needed.   Plan  Patient Instructions  Stop Advair  Begin Breztri 2 puffs Twice daily  , rinse after use  Albuterol inhaler or neb As needed  Continue on Oxygen 4l/m  Activity as tolerated.  Finish Augmentin as directed.  Follow up in 1 week with chest xray  with Dr. Lake Bells or Torry Adamczak NP and As needed   Please contact office for sooner follow up if symptoms do not improve or worsen or seek emergency care       Cavitary pneumonia Right-sided lung abscess questionable etiology.  CT chest in June showed no suspicious consolidation or nodules.  We will continue to treat for suspected underlying infection.  BAL-/fungal/AFB negative to date. Clinically appears to be slowly improving.  Chest x-ray today does show decrease in size.  Will finish out prolonged course of antibiotics with Augmentin.  We will have her follow back with close follow-up in the office-follow-up next week with a follow-up chest x-ray.  Plan  Patient Instructions  Stop Advair  Begin Breztri 2 puffs Twice daily  , rinse after use  Albuterol inhaler or neb As needed  Continue on Oxygen 4l/m  Activity as tolerated.  Finish Augmentin as directed.  Follow up in 1 week with chest xray with Dr. Lake Bells or Darrnell Mangiaracina NP and As needed   Please contact office for sooner follow up if symptoms do not improve or worsen or seek emergency care       Chronic respiratory failure with hypoxia ([place]) O2 saturations adequate on oxygen at 4 L.  Continue current settings to maintain O2 saturations greater than 88 to 90% On follow-up visit check oxygen needs and requirements  Hemoptysis Hemoptysis seems to be improving.  Eliquis remains on hold.  Recent lab work showed normal hemoglobin hematocrit.  Plan  Patient Instructions  Stop Advair  Begin Breztri 2 puffs Twice daily  , rinse after use  Albuterol inhaler or neb As needed  Continue on Oxygen 4l/m  Activity as tolerated.  Finish Augmentin as directed.  Follow up in 1 week with chest xray with Dr. Lake Bells or Minnie Legros NP and As needed   Please contact office for sooner follow up if symptoms do not improve or worsen or seek emergency care    '     [name] Rosetti, NP 02/07/2022

## 2022-02-07 NOTE — Assessment & Plan Note (Signed)
Right-sided lung abscess questionable etiology.  CT chest in June showed no suspicious consolidation or nodules.  We will continue to treat for suspected underlying infection.  BAL-/fungal/AFB negative to date. Clinically appears to be slowly improving.  Chest x-ray today does show decrease in size.  Will finish out prolonged course of antibiotics with Augmentin.  We will have her follow back with close follow-up in the office-follow-up next week with a follow-up chest x-ray.  Plan  Patient Instructions  Stop Advair  Begin Breztri 2 puffs Twice daily  , rinse after use  Albuterol inhaler or neb As needed  Continue on Oxygen 4l/m  Activity as tolerated.  Finish Augmentin as directed.  Follow up in 1 week with chest xray with Dr. Lake Bells or Sage Kopera NP and As needed   Please contact office for sooner follow up if symptoms do not improve or worsen or seek emergency care

## 2022-02-07 NOTE — Assessment & Plan Note (Signed)
Recurrent COPD exacerbation.-We will change from Advair to [facility] . Hold on steroids at this time.  Continue with mucociliary clearance.  May use albuterol nebs as needed.   Plan  Patient Instructions  Stop Advair  Begin Breztri 2 puffs Twice daily  , rinse after use  Albuterol inhaler or neb As needed  Continue on Oxygen 4l/m  Activity as tolerated.  Finish Augmentin as directed.  Follow up in 1 week with chest xray with Dr. Lake Bells or Shahad Mazurek NP and As needed   Please contact office for sooner follow up if symptoms do not improve or worsen or seek emergency care

## 2022-02-07 NOTE — Assessment & Plan Note (Signed)
Hemoptysis seems to be improving.  Eliquis remains on hold.  Recent lab work showed normal hemoglobin hematocrit.  Plan  Patient Instructions  Stop Advair  Begin Breztri 2 puffs Twice daily  , rinse after use  Albuterol inhaler or neb As needed  Continue on Oxygen 4l/m  Activity as tolerated.  Finish Augmentin as directed.  Follow up in 1 week with chest xray with [name] or [name] Housman NP and As needed   Please contact office for sooner follow up if symptoms do not improve or worsen or seek emergency care    '

## 2022-02-07 NOTE — Patient Instructions (Signed)
Stop Advair  Begin Breztri 2 puffs Twice daily  , rinse after use  Albuterol inhaler or neb As needed  Continue on Oxygen 4l/m  Activity as tolerated.  Finish Augmentin as directed.  Follow up in 1 week with chest xray with Dr. Lake Bells or Ethaniel Garfield NP and As needed   Please contact office for sooner follow up if symptoms do not improve or worsen or seek emergency care

## 2022-02-07 NOTE — Assessment & Plan Note (Signed)
O2 saturations adequate on oxygen at 4 L.  Continue current settings to maintain O2 saturations greater than 88 to 90% On follow-up visit check oxygen needs and requirements

## 2022-02-13 ENCOUNTER — Other Ambulatory Visit: Payer: Self-pay

## 2022-02-13 ENCOUNTER — Telehealth: Payer: Self-pay | Admitting: Adult Health

## 2022-02-13 ENCOUNTER — Encounter (HOSPITAL_COMMUNITY): Payer: Self-pay

## 2022-02-13 ENCOUNTER — Emergency Department (HOSPITAL_COMMUNITY): Payer: PPO

## 2022-02-13 ENCOUNTER — Inpatient Hospital Stay (HOSPITAL_COMMUNITY)
Admission: EM | Admit: 2022-02-13 | Discharge: 2022-02-17 | DRG: 175 | Disposition: A | Discharge status: Home Health | Payer: PPO | Attending: Internal Medicine | Admitting: Internal Medicine

## 2022-02-13 DIAGNOSIS — Z87891 Personal history of nicotine dependence: Secondary | ICD-10-CM | POA: Diagnosis not present

## 2022-02-13 DIAGNOSIS — Z882 Allergy status to sulfonamides status: Secondary | ICD-10-CM

## 2022-02-13 DIAGNOSIS — Z90710 Acquired absence of both cervix and uterus: Secondary | ICD-10-CM

## 2022-02-13 DIAGNOSIS — I739 Peripheral vascular disease, unspecified: Secondary | ICD-10-CM | POA: Diagnosis not present

## 2022-02-13 DIAGNOSIS — J9621 Acute and chronic respiratory failure with hypoxia: Secondary | ICD-10-CM | POA: Diagnosis present

## 2022-02-13 DIAGNOSIS — I2699 Other pulmonary embolism without acute cor pulmonale: Principal | ICD-10-CM

## 2022-02-13 DIAGNOSIS — Z888 Allergy status to other drugs, medicaments and biological substances status: Secondary | ICD-10-CM | POA: Diagnosis not present

## 2022-02-13 DIAGNOSIS — I82441 Acute embolism and thrombosis of right tibial vein: Secondary | ICD-10-CM | POA: Diagnosis not present

## 2022-02-13 DIAGNOSIS — I82431 Acute embolism and thrombosis of right popliteal vein: Secondary | ICD-10-CM | POA: Diagnosis not present

## 2022-02-13 DIAGNOSIS — J189 Pneumonia, unspecified organism: Secondary | ICD-10-CM | POA: Diagnosis not present

## 2022-02-13 DIAGNOSIS — Z9049 Acquired absence of other specified parts of digestive tract: Secondary | ICD-10-CM

## 2022-02-13 DIAGNOSIS — I2609 Other pulmonary embolism with acute cor pulmonale: Secondary | ICD-10-CM

## 2022-02-13 DIAGNOSIS — F419 Anxiety disorder, unspecified: Secondary | ICD-10-CM | POA: Diagnosis not present

## 2022-02-13 DIAGNOSIS — J440 Chronic obstructive pulmonary disease with acute lower respiratory infection: Secondary | ICD-10-CM | POA: Diagnosis present

## 2022-02-13 DIAGNOSIS — J852 Abscess of lung without pneumonia: Secondary | ICD-10-CM

## 2022-02-13 DIAGNOSIS — Z86718 Personal history of other venous thrombosis and embolism: Secondary | ICD-10-CM

## 2022-02-13 DIAGNOSIS — J851 Abscess of lung with pneumonia: Secondary | ICD-10-CM | POA: Diagnosis present

## 2022-02-13 DIAGNOSIS — Z9981 Dependence on supplemental oxygen: Secondary | ICD-10-CM

## 2022-02-13 DIAGNOSIS — I272 Pulmonary hypertension, unspecified: Secondary | ICD-10-CM | POA: Diagnosis present

## 2022-02-13 DIAGNOSIS — I2694 Multiple subsegmental pulmonary emboli without acute cor pulmonale: Secondary | ICD-10-CM | POA: Diagnosis present

## 2022-02-13 DIAGNOSIS — Z881 Allergy status to other antibiotic agents status: Secondary | ICD-10-CM

## 2022-02-13 DIAGNOSIS — Z961 Presence of intraocular lens: Secondary | ICD-10-CM | POA: Diagnosis present

## 2022-02-13 DIAGNOSIS — Z515 Encounter for palliative care: Secondary | ICD-10-CM

## 2022-02-13 DIAGNOSIS — J441 Chronic obstructive pulmonary disease with (acute) exacerbation: Secondary | ICD-10-CM | POA: Diagnosis not present

## 2022-02-13 DIAGNOSIS — I248 Other forms of acute ischemic heart disease: Secondary | ICD-10-CM | POA: Diagnosis not present

## 2022-02-13 DIAGNOSIS — I700 Atherosclerosis of aorta: Secondary | ICD-10-CM | POA: Diagnosis not present

## 2022-02-13 DIAGNOSIS — F32A Depression, unspecified: Secondary | ICD-10-CM | POA: Diagnosis not present

## 2022-02-13 DIAGNOSIS — K219 Gastro-esophageal reflux disease without esophagitis: Secondary | ICD-10-CM | POA: Diagnosis present

## 2022-02-13 DIAGNOSIS — I82413 Acute embolism and thrombosis of femoral vein, bilateral: Secondary | ICD-10-CM | POA: Diagnosis not present

## 2022-02-13 DIAGNOSIS — Z7982 Long term (current) use of aspirin: Secondary | ICD-10-CM

## 2022-02-13 DIAGNOSIS — J9601 Acute respiratory failure with hypoxia: Secondary | ICD-10-CM

## 2022-02-13 DIAGNOSIS — Z8249 Family history of ischemic heart disease and other diseases of the circulatory system: Secondary | ICD-10-CM

## 2022-02-13 DIAGNOSIS — J449 Chronic obstructive pulmonary disease, unspecified: Secondary | ICD-10-CM | POA: Diagnosis not present

## 2022-02-13 DIAGNOSIS — Z7951 Long term (current) use of inhaled steroids: Secondary | ICD-10-CM

## 2022-02-13 DIAGNOSIS — Z887 Allergy status to serum and vaccine status: Secondary | ICD-10-CM

## 2022-02-13 DIAGNOSIS — I7121 Aneurysm of the ascending aorta, without rupture: Secondary | ICD-10-CM | POA: Diagnosis not present

## 2022-02-13 DIAGNOSIS — Z7901 Long term (current) use of anticoagulants: Secondary | ICD-10-CM

## 2022-02-13 DIAGNOSIS — I82451 Acute embolism and thrombosis of right peroneal vein: Secondary | ICD-10-CM | POA: Diagnosis present

## 2022-02-13 DIAGNOSIS — Z833 Family history of diabetes mellitus: Secondary | ICD-10-CM

## 2022-02-13 DIAGNOSIS — R000 Tachycardia, unspecified: Secondary | ICD-10-CM | POA: Diagnosis not present

## 2022-02-13 DIAGNOSIS — I82403 Acute embolism and thrombosis of unspecified deep veins of lower extremity, bilateral: Secondary | ICD-10-CM | POA: Diagnosis not present

## 2022-02-13 DIAGNOSIS — I10 Essential (primary) hypertension: Secondary | ICD-10-CM | POA: Diagnosis not present

## 2022-02-13 DIAGNOSIS — R0902 Hypoxemia: Secondary | ICD-10-CM | POA: Diagnosis not present

## 2022-02-13 DIAGNOSIS — R0602 Shortness of breath: Secondary | ICD-10-CM | POA: Diagnosis not present

## 2022-02-13 DIAGNOSIS — Z9841 Cataract extraction status, right eye: Secondary | ICD-10-CM

## 2022-02-13 DIAGNOSIS — R0689 Other abnormalities of breathing: Secondary | ICD-10-CM | POA: Diagnosis not present

## 2022-02-13 DIAGNOSIS — J439 Emphysema, unspecified: Secondary | ICD-10-CM | POA: Diagnosis not present

## 2022-02-13 DIAGNOSIS — Z825 Family history of asthma and other chronic lower respiratory diseases: Secondary | ICD-10-CM

## 2022-02-13 DIAGNOSIS — R062 Wheezing: Secondary | ICD-10-CM | POA: Diagnosis not present

## 2022-02-13 DIAGNOSIS — E785 Hyperlipidemia, unspecified: Secondary | ICD-10-CM | POA: Diagnosis present

## 2022-02-13 DIAGNOSIS — R0603 Acute respiratory distress: Secondary | ICD-10-CM | POA: Diagnosis not present

## 2022-02-13 DIAGNOSIS — Z9842 Cataract extraction status, left eye: Secondary | ICD-10-CM

## 2022-02-13 LAB — BASIC METABOLIC PANEL
Anion gap: 10 (ref 5–15)
BUN: 20 mg/dL (ref 8–23)
CO2: 26 mmol/L (ref 22–32)
Calcium: 8.9 mg/dL (ref 8.9–10.3)
Chloride: 103 mmol/L (ref 98–111)
Creatinine, Ser: 0.96 mg/dL (ref 0.44–1.00)
GFR, Estimated: >60 mL/min (ref >60)
Glucose, Bld: 131 mg/dL — ABNORMAL HIGH (ref 70–99)
Potassium: 4.1 mmol/L (ref 3.5–5.1)
Sodium: 139 mmol/L (ref 135–145)

## 2022-02-13 LAB — CBC WITH DIFFERENTIAL/PLATELET
Abs Immature Granulocytes: 0.07 10*3/uL (ref 0.00–0.07)
Basophils Absolute: 0 10*3/uL (ref 0.0–0.1)
Basophils Relative: 0 %
Eosinophils Absolute: 0 10*3/uL (ref 0.0–0.5)
Eosinophils Relative: 0 %
HCT: 44.4 % (ref 36.0–46.0)
Hemoglobin: 14.3 g/dL (ref 12.0–15.0)
Immature Granulocytes: 1 %
Lymphocytes Relative: 8 %
Lymphs Abs: 0.9 10*3/uL (ref 0.7–4.0)
MCH: 30.1 pg (ref 26.0–34.0)
MCHC: 32.2 g/dL (ref 30.0–36.0)
MCV: 93.5 fL (ref 80.0–100.0)
Monocytes Absolute: 1.1 10*3/uL — ABNORMAL HIGH (ref 0.1–1.0)
Monocytes Relative: 9 %
Neutro Abs: 9.8 10*3/uL — ABNORMAL HIGH (ref 1.7–7.7)
Neutrophils Relative %: 82 %
Platelets: 290 10*3/uL (ref 150–400)
RBC: 4.75 MIL/uL (ref 3.87–5.11)
RDW: 14.4 % (ref 11.5–15.5)
WBC: 11.9 10*3/uL — ABNORMAL HIGH (ref 4.0–10.5)
nRBC: 0 % (ref 0.0–0.2)

## 2022-02-13 LAB — TROPONIN I (HIGH SENSITIVITY): Troponin I (High Sensitivity): 205 ng/L (ref <18)

## 2022-02-13 LAB — BRAIN NATRIURETIC PEPTIDE: B Natriuretic Peptide: 166.7 pg/mL — ABNORMAL HIGH (ref 0.0–100.0)

## 2022-02-13 LAB — PROTIME-INR
INR: 1 (ref 0.8–1.2)
Prothrombin Time: 12.6 seconds (ref 11.4–15.2)

## 2022-02-13 LAB — HEPARIN LEVEL (UNFRACTIONATED): Heparin Unfractionated: 0.86 IU/mL — ABNORMAL HIGH (ref 0.30–0.70)

## 2022-02-13 MED ORDER — TRAZODONE HCL 50 MG PO TABS
100.0000 mg | ORAL_TABLET | Freq: Every day | ORAL | Status: DC
  Administered 2022-02-13 – 2022-02-16 (×4): 100 mg via ORAL
  Filled 2022-02-13 (×4): qty 2

## 2022-02-13 MED ORDER — LORAZEPAM 2 MG/ML IJ SOLN
0.5000 mg | Freq: Once | INTRAMUSCULAR | Status: AC
  Administered 2022-02-13: 0.5 mg via INTRAVENOUS
  Filled 2022-02-13: qty 1

## 2022-02-13 MED ORDER — FLUTICASONE FUROATE-VILANTEROL 200-25 MCG/ACT IN AEPB
1.0000 | INHALATION_SPRAY | Freq: Every day | RESPIRATORY_TRACT | Status: DC
  Filled 2022-02-13 (×2): qty 28

## 2022-02-13 MED ORDER — HEPARIN BOLUS VIA INFUSION
4500.0000 [IU] | Freq: Once | INTRAVENOUS | Status: AC
  Administered 2022-02-13: 4500 [IU] via INTRAVENOUS
  Filled 2022-02-13: qty 4500

## 2022-02-13 MED ORDER — OXYCODONE HCL 5 MG PO TABS
5.0000 mg | ORAL_TABLET | ORAL | Status: DC | PRN
  Administered 2022-02-14 – 2022-02-16 (×6): 5 mg via ORAL
  Filled 2022-02-13 (×6): qty 1

## 2022-02-13 MED ORDER — ASPIRIN 81 MG PO TBEC
81.0000 mg | DELAYED_RELEASE_TABLET | Freq: Every day | ORAL | Status: DC
  Administered 2022-02-14: 81 mg via ORAL
  Filled 2022-02-13: qty 1

## 2022-02-13 MED ORDER — PANTOPRAZOLE SODIUM 40 MG PO TBEC
40.0000 mg | DELAYED_RELEASE_TABLET | Freq: Every day | ORAL | Status: DC
  Administered 2022-02-14 – 2022-02-17 (×4): 40 mg via ORAL
  Filled 2022-02-13 (×4): qty 1

## 2022-02-13 MED ORDER — SODIUM CHLORIDE 0.9% FLUSH: 3.0000 mL | INTRAVENOUS | Status: DC | PRN

## 2022-02-13 MED ORDER — FENTANYL CITRATE PF 50 MCG/ML IJ SOSY
20.0000 ug | PREFILLED_SYRINGE | Freq: Once | INTRAMUSCULAR | Status: AC
  Administered 2022-02-13: 20 ug via INTRAVENOUS
  Filled 2022-02-13: qty 1

## 2022-02-13 MED ORDER — METHYLPREDNISOLONE SODIUM SUCC 40 MG IJ SOLR
40.0000 mg | Freq: Two times a day (BID) | INTRAMUSCULAR | Status: DC
  Administered 2022-02-13 – 2022-02-15 (×4): 40 mg via INTRAVENOUS
  Filled 2022-02-13 (×4): qty 1

## 2022-02-13 MED ORDER — AMOXICILLIN-POT CLAVULANATE 875-125 MG PO TABS
1.0000 | ORAL_TABLET | Freq: Two times a day (BID) | ORAL | Status: DC
  Administered 2022-02-13 – 2022-02-17 (×9): 1 via ORAL
  Filled 2022-02-13 (×9): qty 1

## 2022-02-13 MED ORDER — IPRATROPIUM-ALBUTEROL 0.5-2.5 (3) MG/3ML IN SOLN
3.0000 mL | RESPIRATORY_TRACT | Status: DC
  Administered 2022-02-13 – 2022-02-14 (×3): 3 mL via RESPIRATORY_TRACT
  Filled 2022-02-13 (×4): qty 3

## 2022-02-13 MED ORDER — ACETAMINOPHEN 325 MG PO TABS: 650.0000 mg | ORAL_TABLET | Freq: Four times a day (QID) | ORAL | Status: DC | PRN

## 2022-02-13 MED ORDER — TRIAMTERENE-HCTZ 37.5-25 MG PO TABS
0.5000 | ORAL_TABLET | Freq: Every day | ORAL | Status: DC
  Administered 2022-02-14 – 2022-02-17 (×4): 0.5 via ORAL
  Filled 2022-02-13 (×4): qty 0.5

## 2022-02-13 MED ORDER — SODIUM CHLORIDE 0.9 % IV SOLN: 250.0000 mL | INTRAVENOUS | Status: DC | PRN

## 2022-02-13 MED ORDER — SODIUM CHLORIDE 0.9% FLUSH
3.0000 mL | Freq: Two times a day (BID) | INTRAVENOUS | Status: DC
  Administered 2022-02-13 – 2022-02-17 (×8): 3 mL via INTRAVENOUS

## 2022-02-13 MED ORDER — ALBUTEROL SULFATE (2.5 MG/3ML) 0.083% IN NEBU: 3.0000 mL | INHALATION_SOLUTION | Freq: Four times a day (QID) | RESPIRATORY_TRACT | Status: DC | PRN

## 2022-02-13 MED ORDER — LORAZEPAM 1 MG PO TABS
1.0000 mg | ORAL_TABLET | Freq: Every day | ORAL | Status: DC
  Administered 2022-02-13 – 2022-02-16 (×4): 1 mg via ORAL
  Filled 2022-02-13 (×4): qty 1

## 2022-02-13 MED ORDER — BUDESONIDE 0.5 MG/2ML IN SUSP
0.5000 mg | Freq: Two times a day (BID) | RESPIRATORY_TRACT | Status: DC
  Administered 2022-02-14 – 2022-02-17 (×7): 0.5 mg via RESPIRATORY_TRACT
  Filled 2022-02-13 (×7): qty 2

## 2022-02-13 MED ORDER — HEPARIN (PORCINE) 25000 UT/250ML-% IV SOLN
1000.0000 [IU]/h | INTRAVENOUS | Status: DC
  Administered 2022-02-13: 1200 [IU]/h via INTRAVENOUS
  Administered 2022-02-14: 1100 [IU]/h via INTRAVENOUS
  Administered 2022-02-15: 1000 [IU]/h via INTRAVENOUS
  Filled 2022-02-13 (×3): qty 250

## 2022-02-13 MED ORDER — BUDESON-GLYCOPYRROL-FORMOTEROL 160-9-4.8 MCG/ACT IN AERO: 2.0000 | INHALATION_SPRAY | Freq: Two times a day (BID) | RESPIRATORY_TRACT | Status: DC

## 2022-02-13 MED ORDER — IOHEXOL 350 MG/ML SOLN
75.0000 mL | Freq: Once | INTRAVENOUS | Status: AC | PRN
  Administered 2022-02-13: 75 mL via INTRAVENOUS

## 2022-02-13 MED ORDER — UMECLIDINIUM BROMIDE 62.5 MCG/ACT IN AEPB
1.0000 | INHALATION_SPRAY | Freq: Every day | RESPIRATORY_TRACT | Status: DC
  Filled 2022-02-13 (×2): qty 7

## 2022-02-13 MED ORDER — ACETAMINOPHEN 650 MG RE SUPP: 650.0000 mg | Freq: Four times a day (QID) | RECTAL | Status: DC | PRN

## 2022-02-13 NOTE — Assessment & Plan Note (Signed)
Continue maxzide.  

## 2022-02-13 NOTE — ED Notes (Signed)
Called lab and spoke w/ [place]. Per [place], she will add the troponin and BNP to pt previous collection.

## 2022-02-13 NOTE — ED Notes (Signed)
Attempted IV start unsuccessfully. Placing IV team consult. Notifying phlebotomy of need for lab draw.

## 2022-02-13 NOTE — ED Provider Notes (Signed)
[facility] EMERGENCY DEPARTMENT Provider Note   CSN: [id number] Arrival date & time: 02/13/22  1251     History  Chief Complaint  Patient presents with   Respiratory Distress    [name] is a 81 y.o. female.  HPI Patient presents with shortness of breath.  History of DVT.  Previous PE.  Had recently had cavitary lung lesion.  Had been admitted to hospital and on antibiotics.  However had stopped anticoagulation.  States last night her home oxygen which normally runs about for 5 L was not working.  Had been fixed but still feels short of breath.  States if she attempts to walk her sats will go down to the 70s.  No different cough.  States she been doing fine until today.  No further hemoptysis.   Past Medical History:  Diagnosis Date   Allergic rhinitis    Anemia due to GI blood loss    Anxiety    Asthma    Chronic airway obstruction, not elsewhere classified    Depressive disorder, not elsewhere classified    DVT (deep venous thrombosis) (HCC)    LLE DVT 01/13/19   Dyspnea    Esophageal reflux    Essential hypertension 08/27/2019   PE (pulmonary thromboembolism) ([place]) 01/29/2019   Pneumonia    Wears partial dentures     Home Medications Prior to Admission medications   Medication Sig Start Date End Date Taking? Authorizing Provider  acetaminophen (TYLENOL) 500 MG tablet Take 500 mg by mouth every 6 (six) hours as needed for headache (pain).    [provider]  albuterol (ACCUNEB) 0.63 MG/3ML nebulizer solution Take 1 ampule by nebulization every 6 (six) hours as needed for shortness of breath or wheezing. 11/14/21   [provider]  albuterol (VENTOLIN HFA) 108 (90 Base) MCG/ACT inhaler Inhale 1-2 puffs into the lungs every 6 (six) hours as needed for wheezing or shortness of breath.    [provider]  amoxicillin-clavulanate (AUGMENTIN) 875-125 MG tablet Take 1 tablet by mouth 2 (two) times daily for 21 days. 01/26/22  02/16/22  Jonetta Osgood, MD  aspirin EC 81 MG tablet Take 81 mg by mouth daily. Swallow whole.    [provider]  Budeson-Glycopyrrol-Formoterol (BREZTRI AEROSPHERE) 160-9-4.8 MCG/ACT AERO Inhale 2 puffs into the lungs in the morning and at bedtime. 02/07/22   Parrett, Fonnie Mu, NP  fluticasone-salmeterol (ADVAIR) 250-50 MCG/ACT AEPB Inhale 1 puff then rinse mouth, twice daily Patient taking differently: Inhale 1 puff into the lungs in the morning and at bedtime. Inhale 1 puff then rinse mouth, twice daily 09/29/20   Baird Lyons D, MD  LORazepam (ATIVAN) 1 MG tablet Take 1 mg by mouth at bedtime.    [provider]  omeprazole (PRILOSEC) 20 MG capsule Take 20 mg by mouth daily. 03/26/20   [provider]  traZODone (DESYREL) 100 MG tablet Take 100 mg by mouth at bedtime.    [provider]  triamterene-hydrochlorothiazide (MAXZIDE-25) 37.5-25 MG tablet Take 0.5 tablets by mouth daily. 02/06/19   [provider]      Allergies    Pneumococcal vaccines, Clarithromycin, Estradiol, Sulfa antibiotics, Doxycycline hyclate, Pneumococcal polysaccharide vaccine, Sulfacetamide sodium-sulfur, and Sulfonamide derivatives    Review of Systems   Review of Systems  Physical Exam Updated Vital Signs BP 114/82 (BP Location: Right Arm)   Pulse (!) 122   Temp 97.6 F (36.4 C) (Oral)   Resp (!) 25   Ht 5'  6" (1.676 m)   Wt 72.1 kg   SpO2 92%   BMI 25.66 kg/m  Physical Exam Vitals and nursing note reviewed.  Eyes:     Pupils: Pupils are equal, round, and reactive to light.  Cardiovascular:     Rate and Rhythm: Tachycardia present.  Musculoskeletal:        General: No tenderness.  Skin:    General: Skin is warm.     Capillary Refill: Capillary refill takes less than 2 seconds.  Neurological:     Mental Status: She is alert and oriented to person, place, and time.     ED Results / Procedures / Treatments   Labs (all labs ordered are listed, but  only abnormal results are displayed) Labs Reviewed  BASIC METABOLIC PANEL  CBC WITH DIFFERENTIAL/PLATELET    EKG EKG Interpretation  Date/Time:  Monday February 13 2022 13:21:38 EDT Ventricular Rate:  126 PR Interval:  156 QRS Duration: 82 QT Interval:  317 QTC Calculation: 459 R Axis:   82 Text Interpretation: Sinus tachycardia Borderline right axis deviation Low voltage, precordial leads Confirmed by Davonna Belling [phone]) on 02/13/2022 2:56:59 PM  Radiology DG Chest Portable 1 View  Result Date: 02/13/2022 CLINICAL DATA:  Respiratory distress, shortness of breath EXAM: PORTABLE CHEST 1 VIEW COMPARISON:  02/07/2022 FINDINGS: Cavitary lesion again noted in the right mid lung, unchanged. Previously seen cavitary lesion in the right lower lobe not as well visualized on today's study. Left lung clear. Heart is normal size. Mediastinal contours within normal limits. No acute bony abnormality. IMPRESSION: Stable right mid lung cavitary lesion. Electronically Signed   By: Rolm Baptise M.D.   On: 02/13/2022 13:39    Procedures Procedures    Medications Ordered in ED Medications  LORazepam (ATIVAN) injection 0.5 mg (0.5 mg Intravenous Given 02/13/22 1436)    ED Course/ Medical Decision Making/ A&P                           Medical Decision Making Amount and/or Complexity of Data Reviewed Labs: ordered. Radiology: ordered.  Risk Prescription drug management.   Patient with shortness of breath.  Recent lung abscess.  Also previous pulm embolisms.  Worsening hypoxia.  Is on chronic oxygen.  Chest x-ray independently interpreted.  Stable to prior.  Has been seen by pulmonary.  I have reviewed previous pulmonary notes discussed with Marni Griffon.  Will require admission to the hospital but will get CT angiography to evaluate.  Likely be treated like COPD exacerbation.  Admit to hospitalist however.  Care turned over to Dr. Reather Converse pending the results of the CT  scan.        Final Clinical Impression(s) / ED Diagnoses Final diagnoses:  COPD exacerbation [facility])    Rx / DC Orders ED Discharge Orders     None         Davonna Belling, MD 02/13/22 1457

## 2022-02-13 NOTE — Assessment & Plan Note (Addendum)
81 year old female with complaints of shortness of breath found to be in acute on chronic respiratory failure secondary to submassive PE. Had her eliquis stopped after undergoing bronchoscopy in August with hemoptysis with hx of PE/DVT. Also her oxygen tank stopped delivering oxygen overnight.  -admit to progressive -started on heparin gtt -check bilateral DVT studies -check troponin with heart strain and bnp, check echo  -pccm consulted, okay for progressive  -wean oxygen back to baseline as tolerated. Wears 4L at baseline and currently on 5L

## 2022-02-13 NOTE — ED Notes (Signed)
IV team RN at bedside.  

## 2022-02-13 NOTE — ED Notes (Signed)
IV team at bedside 

## 2022-02-13 NOTE — H&P (Signed)
History and Physical    Patient: [name] NLG:[id number] DOB: 12-29-1940 DOA: 02/13/2022 DOS: the patient was seen and examined on 02/13/2022 PCP: [name], [place]  Patient coming from: Home - lives alone    Chief Complaint: shortness of breath   HPI: KIMIE PIDCOCK is a 81 y.o. female with medical history significant of HTN, hx of DVT in 202, COPD with chronic respiratory failure and hypoxemia, right lower lung abscess in 12/2021 followed by pulm, hx of chronic PE not on [facility] after she had a bronchoscopy in august, her eliquis was held who presented to ED with complaints of shortness of breath. She woke up at 2AM and had to go to the bathroom and when she got back in bed she couldn't breath good and checked her oxygen. It was in the 70's without oxygen because her oxygen wasn't working. She tried to get her portable oxygen and it wasn't working either. She called her daughter in law who called EMS. She was given a breathing treatment and oxygen and got better so they didn't go to the hospital.  Every time she moved her oxygen would drop, but the tank again wasn't working right. Her oxygen would drop to 75% and they called EMS again and came to the hospital. She also has some back pain. She has had some swelling in her left ankle, but not swelling or pain in her calves.   Denies any fever/chills, vision changes/headaches, chest pain or palpitations, no cough, N/V/D, dysuria or leg swelling. She was tight and wheezing when EMS arrived and at the ED.    She does not smoke or drink alcohol.   ER Course:  vitals: afebrile, bp: 141/84, HR: 128, RR: 28, oxygen: 98% on 14L Pertinent labs: wbc: 11.9,  CXR: stable, right mid lung cavitary lesion  CTA chest: + for acute bilateral segmental PE with CT evidence of right heart strain consistent with at least a submassive PE.  In ED: PCCM consulted, heparin started.    Review of Systems: As mentioned in the history of present illness. All other  systems reviewed and are negative. Past Medical History:  Diagnosis Date   Allergic rhinitis    Anemia due to GI blood loss    Anxiety    Asthma    Chronic airway obstruction, not elsewhere classified    Depressive disorder, not elsewhere classified    DVT (deep venous thrombosis) (HCC)    LLE DVT 01/13/19   Dyspnea    Esophageal reflux    Essential hypertension 08/27/2019   PE (pulmonary thromboembolism) ([place]) 01/29/2019   Pneumonia    Wears partial dentures    Past Surgical History:  Procedure Laterality Date   ABDOMINAL HYSTERECTOMY     ABDOMINAL HYSTERECTOMY     BRONCHIAL WASHINGS  01/24/2022   Procedure: BRONCHIAL WASHINGS;  Surgeon: Juanito Doom, MD;  Location: [place];  Service: Cardiopulmonary;;   CATARACT EXTRACTION W/ INTRAOCULAR LENS  IMPLANT, BILATERAL     CHOLECYSTECTOMY N/A 08/12/2021   Procedure: LAPAROSCOPIC CHOLECYSTECTOMY;  Surgeon: Felicie Morn, MD;  Location: [facility];  Service: General;  Laterality: N/A;   COLONOSCOPY     COLONOSCOPY WITH PROPOFOL N/A 08/20/2019   Procedure: COLONOSCOPY WITH PROPOFOL;  Surgeon: Ronald Lobo, MD;  Location: [place];  Service: Endoscopy;  Laterality: N/A;   CYSTOSCOPY     DILATION AND CURETTAGE OF UTERUS     ECTROPION REPAIR Bilateral 05/12/2021   Procedure: REPAIR OF ECTROPION BY LATERAL TARSAL STRIP  OF BILATERAL LOWER EYE LIDS;  Surgeon: Delia Chimes, MD;  Location: [place];  Service: Ophthalmology;  Laterality: Bilateral;   ESOPHAGOGASTRODUODENOSCOPY (EGD) WITH PROPOFOL N/A 08/20/2019   Procedure: ESOPHAGOGASTRODUODENOSCOPY (EGD) WITH PROPOFOL;  Surgeon: Ronald Lobo, MD;  Location: [place];  Service: Endoscopy;  Laterality: N/A;   HEMOSTASIS CONTROL  08/20/2019   Procedure: HEMOSTASIS CONTROL;  Surgeon: Ronald Lobo, MD;  Location: [facility];  Service: Endoscopy;;   INTRAVASCULAR ULTRASOUND/IVUS Left 05/26/2019   Procedure: Intravascular Ultrasound/IVUS;  Surgeon: Waynetta Sandy, MD;  Location: [place] [facility];  Service: Cardiovascular;  Laterality: Left;   IVC FILTER REMOVAL N/A 03/24/2019   Procedure: IVC FILTER REMOVAL;  Surgeon: Waynetta Sandy, MD;  Location: [place] [facility];  Service: Cardiovascular;  Laterality: N/A;   LACRIMAL TUBE INSERTION Right 05/12/2021   Procedure: RIGHT NASOLACRIMAL DUCT PROBE WITH MINI MONOKA STENT PLACEMENT;  Surgeon: Delia Chimes, MD;  Location: [place];  Service: Ophthalmology;  Laterality: Right;   LESION EXCISION WITH COMPLEX REPAIR N/A 05/12/2021   Procedure: NASAL LESION EXCISION;  Surgeon: Delia Chimes, MD;  Location: [place];  Service: Ophthalmology;  Laterality: N/A;   LOWER EXTREMITY VENOGRAPHY Left 05/26/2019   Procedure: LOWER EXTREMITY VENOGRAPHY;  Surgeon: Waynetta Sandy, MD;  Location: [place] [facility];  Service: Cardiovascular;  Laterality: Left;   MULTIPLE TOOTH EXTRACTIONS     NASAL SINUS SURGERY     PERIPHERAL VASCULAR INTERVENTION Left 05/26/2019   Procedure: PERIPHERAL VASCULAR INTERVENTION;  Surgeon: Waynetta Sandy, MD;  Location: [place] [facility];  Service: Cardiovascular;  Laterality: Left;  lower extremity veins   PERIPHERAL VASCULAR THROMBECTOMY  05/26/2019   Procedure: PERIPHERAL VASCULAR THROMBECTOMY;  Surgeon: Waynetta Sandy, MD;  Location: [place] [facility];  Service: Cardiovascular;;   PTOSIS REPAIR Bilateral 05/12/2021   Procedure: EXTERNAL PTOSIS REPAIR LEVATOR ADVANCEDMENT/RESECTION OF BILATERAL UPPER EYE LIDS;  Surgeon: Delia Chimes, MD;  Location: [place];  Service: Ophthalmology;  Laterality: Bilateral;   TUBAL LIGATION     VENA CAVA FILTER PLACEMENT N/A 01/13/2019   Procedure: INSERTION VENA-CAVA FILTER;  Surgeon: Waynetta Sandy, MD;  Location: [place];  Service: Vascular;  Laterality: N/A;   VIDEO BRONCHOSCOPY N/A 01/24/2022   Procedure: VIDEO BRONCHOSCOPY WITHOUT FLUORO;  Surgeon: Juanito Doom, MD;  Location: [place];  Service: Cardiopulmonary;  Laterality: N/A;   Social History:  reports that she quit smoking about 23 years ago. Her smoking use included cigarettes. She has a 10.00 pack-year smoking history. She has never used smokeless tobacco. She reports that she does not drink alcohol and does not use drugs.  Allergies  Allergen Reactions   Pneumococcal Vaccines Shortness Of Breath and Other (See Comments)    Couldn't breathe    Clarithromycin Swelling   Estradiol Rash    Reaction to cream   Sulfa Antibiotics Nausea And Vomiting and Swelling   Doxycycline Hyclate Other (See Comments)    stomach distress   Pneumococcal Polysaccharide Vaccine Other (See Comments)    Unknown   Sulfacetamide Sodium-Sulfur Other (See Comments)   Sulfonamide Derivatives Swelling    Family History  Problem Relation Age of Onset   Diabetes Mother    Coronary artery disease Mother    Emphysema Father    Coronary artery disease Brother     Prior to Admission medications   Medication Sig Start Date End Date Taking? Authorizing Provider  acetaminophen (TYLENOL) 500 MG tablet Take 500 mg by mouth every 6 (six) hours  as needed for headache (pain).    [provider]  albuterol (ACCUNEB) 0.63 MG/3ML nebulizer solution Take 1 ampule by nebulization every 6 (six) hours as needed for shortness of breath or wheezing. 11/14/21   [provider]  albuterol (VENTOLIN HFA) 108 (90 Base) MCG/ACT inhaler Inhale 1-2 puffs into the lungs every 6 (six) hours as needed for wheezing or shortness of breath.    [provider]  amoxicillin-clavulanate (AUGMENTIN) 875-125 MG tablet Take 1 tablet by mouth 2 (two) times daily for 21 days. 01/26/22 02/16/22  Jonetta Osgood, MD  aspirin EC 81 MG tablet Take 81 mg by mouth daily. Swallow whole.    [provider]  Budeson-Glycopyrrol-Formoterol (BREZTRI AEROSPHERE) 160-9-4.8 MCG/ACT AERO Inhale 2 puffs into the lungs in the morning and at bedtime.  02/07/22   Parrett, Fonnie Mu, NP  fluticasone-salmeterol (ADVAIR) 250-50 MCG/ACT AEPB Inhale 1 puff then rinse mouth, twice daily Patient taking differently: Inhale 1 puff into the lungs in the morning and at bedtime. Inhale 1 puff then rinse mouth, twice daily 09/29/20   Baird Lyons D, MD  LORazepam (ATIVAN) 1 MG tablet Take 1 mg by mouth at bedtime.    [provider]  omeprazole (PRILOSEC) 20 MG capsule Take 20 mg by mouth daily. 03/26/20   [provider]  traZODone (DESYREL) 100 MG tablet Take 100 mg by mouth at bedtime.    [provider]  triamterene-hydrochlorothiazide (MAXZIDE-25) 37.5-25 MG tablet Take 0.5 tablets by mouth daily. 02/06/19   [provider]    Physical Exam: Vitals:   02/13/22 1651 02/13/22 1800 02/13/22 1845 02/13/22 1849  BP:  122/82 125/81   Pulse:  (!) 116 (!) 119   Resp:  (!) 26 17   Temp:    97.8 F (36.6 C)  TempSrc:    Oral  SpO2: 92% 95% 90%   Weight:      Height:       General:  Appears calm and comfortable and is in NAD Eyes:  PERRL, EOMI, normal lids, iris ENT:  grossly normal hearing, lips & tongue, mmm; appropriate dentition Neck:  no LAD, masses or thyromegaly; no carotid bruits Cardiovascular:  irregularly, regular, no m/r/g. No LE edema.  Respiratory:   mild expiratory wheezing. No crackles or rales. Normal respiratory effort. Abdomen:  soft, NT, ND, NABS Back:   normal alignment, no CVAT Skin:  no rash or induration seen on limited exam Musculoskeletal:  grossly normal tone BUE/BLE, good ROM, no bony abnormality Lower extremity:  No LE edema.  Limited foot exam with no ulcerations.  2+ distal pulses. Psychiatric:  grossly normal mood and affect, speech fluent and appropriate, AOx3 Neurologic:  CN 2-12 grossly intact, moves all extremities in coordinated fashion, sensation intact   Radiological Exams on Admission: Independently reviewed - see discussion in A/P where applicable  CT Angio Chest PE W  and/or Wo Contrast  Result Date: 02/13/2022 CLINICAL DATA:  Respiratory distress, shortness of breath EXAM: CT ANGIOGRAPHY CHEST WITH CONTRAST TECHNIQUE: Multidetector CT imaging of the chest was performed using the standard protocol during bolus administration of intravenous contrast. Multiplanar CT image reconstructions and MIPs were obtained to evaluate the vascular anatomy. RADIATION DOSE REDUCTION: This exam was performed according to the departmental dose-optimization program which includes automated exposure control, adjustment of the mA and/or kV according to patient size and/or use of iterative reconstruction technique. CONTRAST:  36m OMNIPAQUE IOHEXOL 350 MG/ML SOLN COMPARISON:  01/23/2022 FINDINGS: Cardiovascular: This is a technically  adequate evaluation of the pulmonary vasculature. There are bilateral segmental pulmonary emboli throughout all vascular distributions. Moderate clot burden, with dilated RV/LV ratio measuring 1.69 consistent with right heart strain. Normal caliber of the thoracic aorta. Atherosclerosis of the aorta and coronary vasculature. The heart is not enlarged. No pericardial effusion. Mediastinum/Nodes: No enlarged mediastinal, hilar, or axillary lymph nodes. Thyroid gland, trachea, and esophagus demonstrate no significant findings. Lungs/Pleura: Since the 01/23/2022 CT, the airspace disease within the right lower lobe has near completely resolved. There is a persistent cavitary lesion within the right lower lobe with persistent gas fluid level, measuring 7.1 x 3.7 x 4.7 cm, reference image 56/5. This has decreased in size since the previous exam. Background emphysema is again noted. 1 cm area of loculated fluid within the right major fissure. No other pleural effusion. No pneumothorax. Central airways are patent. Upper Abdomen: Reflux of contrast into the hepatic veins consistent with right heart strain described above. No other acute upper abdominal findings. Musculoskeletal:  No acute or destructive bony lesions. Reconstructed images demonstrate no additional findings. Review of the MIP images confirms the above findings. IMPRESSION: 1. Positive for acute bilateral segmental PE with CT evidence of right heart strain (RV/LV Ratio = 1.69) consistent with at least submassive (intermediate risk) PE. The presence of right heart strain has been associated with an increased risk of morbidity and mortality. Please refer to the "Code PE Focused" order set in EPIC. 2. Near complete resolution of the right lower lobe pneumonia seen previously, with decreased size of the infected superior segment right lower lobe bulla. 3. Aortic Atherosclerosis (ICD10-I70.0) and Emphysema (ICD10-J43.9). Critical Value/emergent results were called by telephone at the time of interpretation on 02/13/2022 at 6:02 pm to provider DR Reather Converse, who verbally acknowledged these results. Electronically Signed   By: Randa Ngo M.D.   On: 02/13/2022 18:04   DG Chest Portable 1 View  Result Date: 02/13/2022 CLINICAL DATA:  Respiratory distress, shortness of breath EXAM: PORTABLE CHEST 1 VIEW COMPARISON:  02/07/2022 FINDINGS: Cavitary lesion again noted in the right mid lung, unchanged. Previously seen cavitary lesion in the right lower lobe not as well visualized on today's study. Left lung clear. Heart is normal size. Mediastinal contours within normal limits. No acute bony abnormality. IMPRESSION: Stable right mid lung cavitary lesion. Electronically Signed   By: Rolm Baptise M.D.   On: 02/13/2022 13:39    EKG: Independently reviewed.  Sinus tachycardia with rate 126, borderline right axis deviation; nonspecific ST changes with no evidence of acute ischemia   Labs on Admission: I have personally reviewed the available labs and imaging studies at the time of the admission.  Pertinent labs:   Wbc: 11.9   Assessment and Plan: Principal Problem:   Acute on chronic respiratory failure with hypoxia secondary to  submassive PE Active Problems:   COPD with acute exacerbation (HCC)   Pulmonary abscess (HCC)   Essential hypertension   Anxiety   Esophageal reflux   Acute pulmonary embolism (HCC)    Assessment and Plan: * Acute on chronic respiratory failure with hypoxia secondary to submassive PE 81 year old female with complaints of shortness of breath found to be in acute on chronic respiratory failure secondary to submassive PE. Had her eliquis stopped after undergoing bronchoscopy in August with hemoptysis with hx of PE/DVT. Also her oxygen tank stopped delivering oxygen overnight.  -admit to progressive -started on heparin gtt -check bilateral DVT studies -check troponin with heart strain and bnp, check echo  -  pccm consulted, okay for progressive  -wean oxygen back to baseline as tolerated. Wears 4L at baseline and currently on 5L   COPD with acute exacerbation ([place]) Per Ems was wheezing with acute on chronic respiratory failure Mild wheezing on my exam Per pulm continue treatment for copd exacerbation -continue home bretzi -solumedrol -scheduled duonebs -SABA prn   Pulmonary abscess (HCC) Improved per imaging Continue augmentin per pulmonology   Essential hypertension Continue maxzide   Anxiety Continue ativan prn   Esophageal reflux Continue PPI     Advance Care Planning:   Code Status: Full Code   Consults: pulmonology: Dr. [place] Bells   DVT Prophylaxis: heparin gtt   Family Communication: daughter in law at bedside   Severity of Illness: The appropriate patient status for this patient is INPATIENT. Inpatient status is judged to be reasonable and necessary in order to provide the required intensity of service to ensure the patient's safety. The patient's presenting symptoms, physical exam findings, and initial radiographic and laboratory data in the context of their chronic comorbidities is felt to place them at high risk for further clinical deterioration. Furthermore, it  is not anticipated that the patient will be medically stable for discharge from the hospital within 2 midnights of admission.   * I certify that at the point of admission it is my clinical judgment that the patient will require inpatient hospital care spanning beyond 2 midnights from the point of admission due to high intensity of service, high risk for further deterioration and high frequency of surveillance required.*  Author: Orma Flaming, MD 02/13/2022 7:28 PM  For on call review [email or web address].

## 2022-02-13 NOTE — Assessment & Plan Note (Signed)
Per Ems was wheezing with acute on chronic respiratory failure Mild wheezing on my exam Per pulm continue treatment for copd exacerbation -continue home bretzi -solumedrol -scheduled duonebs -SABA prn

## 2022-02-13 NOTE — Assessment & Plan Note (Signed)
Continue ativan prn

## 2022-02-13 NOTE — ED Notes (Signed)
Pt back from CT

## 2022-02-13 NOTE — ED Notes (Addendum)
IV re-dressed. Small amount of blood around insertion site. Meal delivered to bedside.

## 2022-02-13 NOTE — Telephone Encounter (Signed)
She should call EMS again.. please advise

## 2022-02-13 NOTE — Consult Note (Signed)
NAME:  [name], MRN:  [id number], DOB:  April 09, 1941, LOS: 0 ADMISSION DATE:  02/13/2022, CONSULTATION DATE:  9/18 REFERRING MD:  pickering , CHIEF COMPLAINT: acute on chronic resp failure   History of Present Illness:  This is a 81 year old female patient who was last seen in our office on 9/12 for follow-up in regards to hospitalization towards the end of August where she was admitted for a lung abscess.  She was discharged on August 31, sent home on 8 additional days of Augmentin, and seen in our office.  During her time from hospitalization her Eliquis had been on hold due to hemoptysis.  She was planned to follow-up with another chest x-ray in our office following completion of antibiotics, at time of visit she was felt to  be slowly improving. On 9/18 the office was called the patient was complaining of worsening shortness of breath.  Apparently over the p.m. hours oxygen tank stopped delivering oxygen.  Oximetry as low as 70%.  Her DME was able to get her oxygen, however later in the morning hours on 9/18 she was in significant respiratory distress with pulse oximetry as low as 82% on 5 L.  EMS was called, she was administered 10 mg of inhaled albuterol and Atrovent, she was placed on supplemental oxygen, and transferred to the emergency room for further evaluation. The patient was placed on supplemental oxygen, at 5 L still felt short of breath, and noted exertional dyspnea much greater than baseline because of this pulmonary was asked to evaluate.  On chest x-ray Left lung is clear.  There is unchanged right midlung cavitary lesion  Pertinent  Medical History  Chronic respiratory failure secondary to COPD Former smoker Lung abscess last evaluated January 23, 2022 requiring hospitalization   Was negative for pulmonary emboli, prior barium esophagram is negative.  Underwent bronchoscopy during hospitalization which was negative.  Was initially treated with IV antibiotics and sent home on  Augmentin. Allergic rhinitis Anemia from prior GI bleed Anxiety Depression Prior DVT involving the left lower extremity Pulmonary emboli 2020 Esophageal reflux   Interim History / Subjective:  Feels a little better but still cannot tolerate activity reports feels very anxious  Objective   Blood pressure (Abnormal) 117/90, pulse (Abnormal) 123, temperature (Abnormal) 97.4 F (36.3 C), temperature source Oral, resp. rate (Abnormal) 21, height '5\' 6"'$  (1.676 m), weight 72.1 kg, SpO2 91 %.       No intake or output data in the 24 hours ending 02/13/22 1413 Filed Weights   02/13/22 1316  Weight: 72.1 kg    Examination: General: 81 year old female resting in stretcher no acute distress HENT: HEENT normocephalic atraumatic normal phonation no JVD mucous membranes moist Lungs: Diffuse expiratory wheezing currently no accessory use on 4 L removed speak full sentences Cardiovascular: Tachycardic regular rhythm without murmur rub or gallop Abdomen: Soft nontender no organomegaly Extremities: Warm dry no significant edema Neuro: Awake oriented GU: Due to void  Resolved Hospital Problem list     Assessment & Plan:  Acute on chronic hypoxic respiratory failure secondary to acute exacerbation of chronic obstructive pulmonary disease.  Suspect exacerbated by prolonged episode without oxygen, underlying anxiety not helping.  Does have a history of thromboembolic disease so also need to rule out PE as she has been off her anticoagulation Plan Admit to stepdown Start scheduled Solu-Medrol 40 every 12 Scheduled nebulized bronchodilators Supplemental oxygen and pulse oximetry PPI Flutter valve CT angiogram to rule out pulmonary emboli  Right midlung pulmonary  abscess -Radiographically looks about the same Plan Continue Augmentin, she was plan to discontinue this week, given ongoing abscess would be inclined to continue another 2 to 3 weeks of therapy  Situational anxiety Plan She takes  Ativan at home nightly, as needed we can increase this  History of hypertension Plan Continue Maxide  History of pulmonary emboli and DVT, her anticoagulation had been held due to hemoptysis this is resolved Plan We will start IV heparin for now, that way we can easily stop If no worsening bleeding can resume her DOAC after 48 hours  Best Practice (right click and "Reselect all SmartList Selections" daily)   Per primary  Labs   CBC: No results for input(s): "WBC", "NEUTROABS", "HGB", "HCT", "MCV", "PLT" in the last 168 hours.  Basic Metabolic Panel: No results for input(s): "NA", "K", "CL", "CO2", "GLUCOSE", "BUN", "CREATININE", "CALCIUM", "MG", "PHOS" in the last 168 hours. GFR: Estimated Creatinine Clearance: 53 mL/min (by C-G formula based on SCr of 0.89 mg/dL). No results for input(s): "PROCALCITON", "WBC", "LATICACIDVEN" in the last 168 hours.  Liver Function Tests: No results for input(s): "AST", "ALT", "ALKPHOS", "BILITOT", "PROT", "ALBUMIN" in the last 168 hours. No results for input(s): "LIPASE", "AMYLASE" in the last 168 hours. No results for input(s): "AMMONIA" in the last 168 hours.  ABG No results found for: "PHART", "PCO2ART", "PO2ART", "HCO3", "TCO2", "ACIDBASEDEF", "O2SAT"   Coagulation Profile: No results for input(s): "INR", "PROTIME" in the last 168 hours.  Cardiac Enzymes: No results for input(s): "CKTOTAL", "CKMB", "CKMBINDEX", "TROPONINI" in the last 168 hours.  HbA1C: Hgb A1c MFr Bld  Date/Time Value Ref Range Status  08/27/2019 05:07 PM 4.9 4.8 - 5.6 % Final    Comment:    (NOTE) Pre diabetes:          5.7%-6.4% Diabetes:              >6.4% Glycemic control for   <7.0% adults with diabetes   05/28/2013 05:45 AM 5.8 (H) <5.7 % Final    Comment:    (NOTE)                                                                       According to the ADA Clinical Practice Recommendations for 2011, when HbA1c is used as a screening test:  >=6.5%    Diagnostic of Diabetes Mellitus           (if abnormal result is confirmed) 5.7-6.4%   Increased risk of developing Diabetes Mellitus References:Diagnosis and Classification of Diabetes Mellitus,Diabetes DUKG,2542,70(WCBJS 1):S62-S69 and Standards of Medical Care in         Diabetes - 2011,Diabetes EGBT,5176,16 (Suppl 1):S11-S61.    CBG: No results for input(s): "GLUCAP" in the last 168 hours.  Review of Systems:   Review of Systems  Constitutional:  Negative for chills, fever and malaise/fatigue.  HENT: Negative.    Eyes: Negative.   Respiratory:  Positive for cough, sputum production, shortness of breath and wheezing. Negative for hemoptysis.   Cardiovascular:  Positive for leg swelling.  Gastrointestinal:  Positive for abdominal pain and heartburn.  Genitourinary: Negative.   Musculoskeletal:  Positive for back pain.  Skin: Negative.   Neurological: Negative.   Endo/Heme/Allergies: Negative.   Psychiatric/Behavioral: Negative.  Past Medical History:  She,  has a past medical history of Allergic rhinitis, Anemia due to GI blood loss, Anxiety, Asthma, Chronic airway obstruction, not elsewhere classified, Depressive disorder, not elsewhere classified, DVT (deep venous thrombosis) ([place]), Dyspnea, Esophageal reflux, Essential hypertension (08/27/2019), PE (pulmonary thromboembolism) ([place]) (01/29/2019), Pneumonia, and Wears partial dentures.   Surgical History:   Past Surgical History:  Procedure Laterality Date   ABDOMINAL HYSTERECTOMY     ABDOMINAL HYSTERECTOMY     BRONCHIAL WASHINGS  01/24/2022   Procedure: BRONCHIAL WASHINGS;  Surgeon: Juanito Doom, MD;  Location: [facility];  Service: Cardiopulmonary;;   CATARACT EXTRACTION W/ INTRAOCULAR LENS  IMPLANT, BILATERAL     CHOLECYSTECTOMY N/A 08/12/2021   Procedure: LAPAROSCOPIC CHOLECYSTECTOMY;  Surgeon: Felicie Morn, MD;  Location: [facility];  Service: General;  Laterality: N/A;   COLONOSCOPY     COLONOSCOPY  WITH PROPOFOL N/A 08/20/2019   Procedure: COLONOSCOPY WITH PROPOFOL;  Surgeon: Ronald Lobo, MD;  Location: [place];  Service: Endoscopy;  Laterality: N/A;   CYSTOSCOPY     DILATION AND CURETTAGE OF UTERUS     ECTROPION REPAIR Bilateral 05/12/2021   Procedure: REPAIR OF ECTROPION BY LATERAL TARSAL STRIP OF BILATERAL LOWER EYE LIDS;  Surgeon: Delia Chimes, MD;  Location: [facility];  Service: Ophthalmology;  Laterality: Bilateral;   ESOPHAGOGASTRODUODENOSCOPY (EGD) WITH PROPOFOL N/A 08/20/2019   Procedure: ESOPHAGOGASTRODUODENOSCOPY (EGD) WITH PROPOFOL;  Surgeon: Ronald Lobo, MD;  Location: [facility];  Service: Endoscopy;  Laterality: N/A;   HEMOSTASIS CONTROL  08/20/2019   Procedure: HEMOSTASIS CONTROL;  Surgeon: Ronald Lobo, MD;  [facility]: [facility];  Service: Endoscopy;;   INTRAVASCULAR ULTRASOUND/IVUS Left 05/26/2019   Procedure: Intravascular Ultrasound/IVUS;  Surgeon: Waynetta Sandy, MD;  Location: [place] [facility];  Service: Cardiovascular;  Laterality: Left;   IVC FILTER REMOVAL N/A 03/24/2019   Procedure: IVC FILTER REMOVAL;  Surgeon: Waynetta Sandy, MD;  Location: [place] [facility];  Service: Cardiovascular;  Laterality: N/A;   LACRIMAL TUBE INSERTION Right 05/12/2021   Procedure: RIGHT NASOLACRIMAL DUCT PROBE WITH MINI MONOKA STENT PLACEMENT;  Surgeon: Delia Chimes, MD;  Location: [place];  Service: Ophthalmology;  Laterality: Right;   LESION EXCISION WITH COMPLEX REPAIR N/A 05/12/2021   Procedure: NASAL LESION EXCISION;  Surgeon: Delia Chimes, MD;  Location: [place];  Service: Ophthalmology;  Laterality: N/A;   LOWER EXTREMITY VENOGRAPHY Left 05/26/2019   Procedure: LOWER EXTREMITY VENOGRAPHY;  Surgeon: Waynetta Sandy, MD;  Location: [place] [facility];  Service: Cardiovascular;  Laterality: Left;   MULTIPLE TOOTH EXTRACTIONS     NASAL SINUS SURGERY     PERIPHERAL VASCULAR INTERVENTION Left 05/26/2019   Procedure: PERIPHERAL  VASCULAR INTERVENTION;  Surgeon: Waynetta Sandy, MD;  Location: [place] [facility];  Service: Cardiovascular;  Laterality: Left;  lower extremity veins   PERIPHERAL VASCULAR THROMBECTOMY  05/26/2019   Procedure: PERIPHERAL VASCULAR THROMBECTOMY;  Surgeon: Waynetta Sandy, MD;  Location: [place] [facility];  Service: Cardiovascular;;   PTOSIS REPAIR Bilateral 05/12/2021   Procedure: EXTERNAL PTOSIS REPAIR LEVATOR ADVANCEDMENT/RESECTION OF BILATERAL UPPER EYE LIDS;  Surgeon: Delia Chimes, MD;  Location: [facility];  Service: Ophthalmology;  Laterality: Bilateral;   TUBAL LIGATION     VENA CAVA FILTER PLACEMENT N/A 01/13/2019   Procedure: INSERTION VENA-CAVA FILTER;  Surgeon: Waynetta Sandy, MD;  Location: [facility];  Service: Vascular;  Laterality: N/A;   VIDEO BRONCHOSCOPY N/A 01/24/2022   Procedure: VIDEO BRONCHOSCOPY WITHOUT FLUORO;  Surgeon: Juanito Doom, MD;  Location: [facility];  Service: Cardiopulmonary;  Laterality: N/A;     Social History:   reports that she quit smoking about 23 years ago. Her smoking use included cigarettes. She has a 10.00 pack-year smoking history. She has never used smokeless tobacco. She reports that she does not drink alcohol and does not use drugs.   Family History:  Her family history includes Coronary artery disease in her brother and mother; Diabetes in her mother; Emphysema in her father.   Allergies Allergies  Allergen Reactions   Pneumococcal Vaccines Shortness Of Breath and Other (See Comments)    Couldn't breathe    Clarithromycin Swelling   Estradiol Rash    Reaction to cream   Sulfa Antibiotics Nausea And Vomiting and Swelling   Doxycycline Hyclate     Other reaction(s): Other (See Comments), stomach distress   Pneumococcal Polysaccharide Vaccine     Other reaction(s): Unknown   Sulfacetamide Sodium-Sulfur Other (See Comments)   Sulfonamide Derivatives Swelling     Home Medications  Prior to Admission  medications   Medication Sig Start Date End Date Taking? Authorizing Provider  acetaminophen (TYLENOL) 500 MG tablet Take 500 mg by mouth every 6 (six) hours as needed for headache (pain).    [provider]  albuterol (ACCUNEB) 0.63 MG/3ML nebulizer solution Take 1 ampule by nebulization every 6 (six) hours as needed for shortness of breath or wheezing. 11/14/21   [provider]  albuterol (VENTOLIN HFA) 108 (90 Base) MCG/ACT inhaler Inhale 1-2 puffs into the lungs every 6 (six) hours as needed for wheezing or shortness of breath.    [provider]  amoxicillin-clavulanate (AUGMENTIN) 875-125 MG tablet Take 1 tablet by mouth 2 (two) times daily for 21 days. 01/26/22 02/16/22  Jonetta Osgood, MD  aspirin EC 81 MG tablet Take 81 mg by mouth daily. Swallow whole.    [provider]  Budeson-Glycopyrrol-Formoterol (BREZTRI AEROSPHERE) 160-9-4.8 MCG/ACT AERO Inhale 2 puffs into the lungs in the morning and at bedtime. 02/07/22   Parrett, Fonnie Mu, NP  fluticasone-salmeterol (ADVAIR) 250-50 MCG/ACT AEPB Inhale 1 puff then rinse mouth, twice daily Patient taking differently: Inhale 1 puff into the lungs in the morning and at bedtime. Inhale 1 puff then rinse mouth, twice daily 09/29/20   Baird Lyons D, MD  LORazepam (ATIVAN) 1 MG tablet Take 1 mg by mouth at bedtime.    [provider]  omeprazole (PRILOSEC) 20 MG capsule Take 20 mg by mouth daily. 03/26/20   [provider]  traZODone (DESYREL) 100 MG tablet Take 100 mg by mouth at bedtime.    [provider]  triamterene-hydrochlorothiazide (MAXZIDE-25) 37.5-25 MG tablet Take 0.5 tablets by mouth daily. 02/06/19   [provider]     Critical care time:na   Erick Colace ACNP-BC [place] Pager # [phone] OR # [phone] if no answer

## 2022-02-13 NOTE — ED Notes (Signed)
EMS neb tx finished at this time

## 2022-02-13 NOTE — Telephone Encounter (Signed)
Patient's daughter in law, Katharine Look called stating patients o2 tank when bad last night and EMS had to bring a new tank. Patients oxygen levels are not maintaining this morning- 90% then drops to 70% when she gets up. Patient also does not have a portable tank at this time. An order was placed for one last night but they are unsure when they will get a machine. If it does not arrive today patient may not be able to make it to her appointment tomorrow.   Please call Katharine Look back with advice on oxygen as soon as possible.

## 2022-02-13 NOTE — ED Notes (Addendum)
Pt started saying she can't breathe and hyperventilating. Pt w/ increased work of breathing. O2 increased to 6. O2 sat 88%. Called RT. Lung sounds diminished. Pt unable to tolerate minimal movement.

## 2022-02-13 NOTE — Assessment & Plan Note (Signed)
Continue PPI ?

## 2022-02-13 NOTE — ED Provider Notes (Signed)
Patient with significant COPD history follows with pulmonology presents with worsening shortness of breath and tachycardia.  Patient was taken off anticoagulation due to hemoptysis recently.  Patient's had worsening exertional dyspnea and normally is on 5 L at home.  Patient is requiring 6 L and also had sats in the 70s prior to arrival.  Patient care signed out to follow-up pulmonary recommendations and for admission to the hospitalist.  Discussed with pulmonology Dr. Warnell Forester recommended calling hospitalist for admission.  CT angiogram performed final results pending.  Spoke with Dr Owens Shark radiology for CTA results. Heparin started.  Bilateral segmental PEs with right heart strain. Infected bullae right lower lobe, decreased in size.  .Critical Care  Performed by: Elnora Morrison, MD Authorized by: Elnora Morrison, MD   Critical care provider statement:    Critical care time (minutes):  30   Critical care start time:  02/13/2022 5:30 PM   Critical care end time:  02/13/2022 6:00 PM   Critical care time was exclusive of:  Separately billable procedures and treating other patients and teaching time   Critical care was necessary to treat or prevent imminent or life-threatening deterioration of the following conditions:  Respiratory failure   Critical care was time spent personally by me on the following activities:  Development of treatment plan with patient or surrogate, discussions with consultants, evaluation of patient's response to treatment, examination of patient, ordering and review of laboratory studies, ordering and review of radiographic studies, ordering and performing treatments and interventions, pulse oximetry, re-evaluation of patient's condition and review of old charts  COPD exacerbation ([place])  Other acute pulmonary embolism with acute cor pulmonale ([place])  Acute respiratory failure with hypoxia ([place])    Elnora Morrison, MD 02/13/22 [phone]

## 2022-02-13 NOTE — ED Notes (Signed)
RT at bedside.

## 2022-02-13 NOTE — ED Notes (Signed)
Pt transported to CT at this time.

## 2022-02-13 NOTE — ED Triage Notes (Signed)
Pt arrived to ED via EMS from home w/ c/o respiratory distress. Pt was noted to be 82% on 5L O2 via [place] with a lengthy tube hooked up to concentrator. Pt reports her baseline O2 is 4L via [place]. EMS placed pt on 6L O2 via [place] and O2 came up to 91%. Pt given '10mg'$  albuterol and 0.'5mg'$  atrovent via nebulizer which is running upon arrival to ED. Pt refused PTA IV d/t normally has to have IV team place IVs. EMS VS: 140/102, HR 126, 96% on 6L [place] and 10L nebulizer. Pt still on abx for PNA.   Upon pt arrival to ED room, pt placed on 4L O2 via  and neb tx turned off to obtain baseline O2 sat. Pt began saying she couldn't breathe and that she needed more oxygen. Pt became labored. O2 sat on 4L was 87%. Pt O2 turned up to 6L and O2 sat came up to 90%. Neb tx restarted.

## 2022-02-13 NOTE — ED Notes (Signed)
Notified EDP that pt has increased work of breathing while moving around in bed.

## 2022-02-13 NOTE — Assessment & Plan Note (Signed)
Improved per imaging Continue augmentin per pulmonology

## 2022-02-13 NOTE — Progress Notes (Signed)
ANTICOAGULATION CONSULT NOTE - Initial Consult  Pharmacy Consult for Heparin Indication:  VTE Treatment  Allergies  Allergen Reactions   Pneumococcal Vaccines Shortness Of Breath and Other (See Comments)    Couldn't breathe    Clarithromycin Swelling   Estradiol Rash    Reaction to cream   Sulfa Antibiotics Nausea And Vomiting and Swelling   Doxycycline Hyclate     Other reaction(s): Other (See Comments), stomach distress   Pneumococcal Polysaccharide Vaccine     Other reaction(s): Unknown   Sulfacetamide Sodium-Sulfur Other (See Comments)   Sulfonamide Derivatives Swelling    Patient Measurements: Height: '5\' 6"'$  (167.6 cm) Weight: 72.1 kg (159 lb) IBW/kg (Calculated) : 59.3 Heparin Dosing Weight: 72.1 kg  Vital Signs: Temp: 97.6 F (36.4 C) (09/18 1447) Temp Source: Oral (09/18 1447) BP: 100/79 (09/18 1500) Pulse Rate: 121 (09/18 1500)  Labs: No results for input(s): "HGB", "HCT", "PLT", "APTT", "LABPROT", "INR", "HEPARINUNFRC", "HEPRLOWMOCWT", "CREATININE", "CKTOTAL", "CKMB", "TROPONINIHS" in the last 72 hours.  Estimated Creatinine Clearance: 53 mL/min (by C-G formula based on SCr of 0.89 mg/dL).   Medical History: Past Medical History:  Diagnosis Date   Allergic rhinitis    Anemia due to GI blood loss    Anxiety    Asthma    Chronic airway obstruction, not elsewhere classified    Depressive disorder, not elsewhere classified    DVT (deep venous thrombosis) ([facility])    LLE DVT 01/13/19   Dyspnea    Esophageal reflux    Essential hypertension 08/27/2019   PE (pulmonary thromboembolism) ([place]) 01/29/2019   Pneumonia    Wears partial dentures     Medications:  (Not in a hospital admission)  Scheduled:   amoxicillin-clavulanate  1 tablet Oral Q12H   budesonide (PULMICORT) nebulizer solution  0.5 mg Nebulization BID   ipratropium-albuterol  3 mL Nebulization Q4H   methylPREDNISolone (SOLU-MEDROL) injection  40 mg Intravenous Q12H   Infusions:  PRN:    Assessment: 28 yof with a history of COPD, anxiety, depression, prior LLE DVT, PE in 2020. Patient is presenting with acute on chronic resp failure. Heparin per pharmacy consult placed for  VTE treatment , plan to resurme DOAC if tolerating 48hrs anticoagulation on heparin.  Hospitalized in August for lung abscess. Previously on Eliquis but was stopped at this time secondary to hemoptysis.  Patient is not on anticoagulation prior to arrival.  Hgb 14.3; plt 290  Goal of Therapy:  Heparin level 0.3-0.7 units/ml Monitor platelets by anticoagulation protocol: Yes   Plan:  Give IV heparin 4500 units bolus x 1 Start heparin infusion at 1200 units/hr Check anti-Xa level in 8 hours and daily while on heparin Continue to monitor H&H and platelets  Lorelei Pont, PharmD, BCPS 02/13/2022 3:47 PM ED Clinical Pharmacist -  [phone]

## 2022-02-13 NOTE — Telephone Encounter (Signed)
Called patient back and advised her that if her oxygen is dropping still even if she is on oxygen that yes she needs to go to the ER. Nothing further needed

## 2022-02-13 NOTE — Telephone Encounter (Signed)
Called [employer] to check the status of oxygen tanks. Terry from [place] states that patient called after hours last night and that this matter was addressed this morning. Nothing further needed

## 2022-02-14 ENCOUNTER — Inpatient Hospital Stay (HOSPITAL_COMMUNITY): Payer: PPO

## 2022-02-14 ENCOUNTER — Ambulatory Visit: Payer: PPO | Admitting: Adult Health

## 2022-02-14 ENCOUNTER — Other Ambulatory Visit (HOSPITAL_COMMUNITY): Payer: Self-pay

## 2022-02-14 DIAGNOSIS — J449 Chronic obstructive pulmonary disease, unspecified: Secondary | ICD-10-CM | POA: Diagnosis not present

## 2022-02-14 DIAGNOSIS — J441 Chronic obstructive pulmonary disease with (acute) exacerbation: Secondary | ICD-10-CM | POA: Diagnosis not present

## 2022-02-14 DIAGNOSIS — I2699 Other pulmonary embolism without acute cor pulmonale: Secondary | ICD-10-CM

## 2022-02-14 DIAGNOSIS — F419 Anxiety disorder, unspecified: Secondary | ICD-10-CM | POA: Diagnosis not present

## 2022-02-14 DIAGNOSIS — R0603 Acute respiratory distress: Secondary | ICD-10-CM | POA: Diagnosis not present

## 2022-02-14 DIAGNOSIS — J9621 Acute and chronic respiratory failure with hypoxia: Secondary | ICD-10-CM | POA: Diagnosis not present

## 2022-02-14 DIAGNOSIS — J851 Abscess of lung with pneumonia: Secondary | ICD-10-CM | POA: Diagnosis not present

## 2022-02-14 LAB — BASIC METABOLIC PANEL
Anion gap: 14 (ref 5–15)
BUN: 18 mg/dL (ref 8–23)
CO2: 22 mmol/L (ref 22–32)
Calcium: 9.2 mg/dL (ref 8.9–10.3)
Chloride: 101 mmol/L (ref 98–111)
Creatinine, Ser: 0.95 mg/dL (ref 0.44–1.00)
GFR, Estimated: >60 mL/min (ref >60)
Glucose, Bld: 181 mg/dL — ABNORMAL HIGH (ref 70–99)
Potassium: 3.8 mmol/L (ref 3.5–5.1)
Sodium: 137 mmol/L (ref 135–145)

## 2022-02-14 LAB — CBC
HCT: 44.8 % (ref 36.0–46.0)
Hemoglobin: 14.6 g/dL (ref 12.0–15.0)
MCH: 30.3 pg (ref 26.0–34.0)
MCHC: 32.6 g/dL (ref 30.0–36.0)
MCV: 92.9 fL (ref 80.0–100.0)
Platelets: 306 10*3/uL (ref 150–400)
RBC: 4.82 MIL/uL (ref 3.87–5.11)
RDW: 14.3 % (ref 11.5–15.5)
WBC: 9.9 10*3/uL (ref 4.0–10.5)
nRBC: 0 % (ref 0.0–0.2)

## 2022-02-14 LAB — ECHOCARDIOGRAM COMPLETE
Area-P 1/2: 5.5 cm2
Height: 66 in
S' Lateral: 3 cm
Weight: 2544 oz

## 2022-02-14 LAB — HEPARIN LEVEL (UNFRACTIONATED): Heparin Unfractionated: 0.72 IU/mL — ABNORMAL HIGH (ref 0.30–0.70)

## 2022-02-14 LAB — TROPONIN I (HIGH SENSITIVITY): Troponin I (High Sensitivity): 171 ng/L (ref <18)

## 2022-02-14 MED ORDER — ALBUTEROL SULFATE (2.5 MG/3ML) 0.083% IN NEBU: 3.0000 mL | INHALATION_SOLUTION | RESPIRATORY_TRACT | Status: DC | PRN

## 2022-02-14 MED ORDER — IPRATROPIUM-ALBUTEROL 0.5-2.5 (3) MG/3ML IN SOLN: 3.0000 mL | Freq: Four times a day (QID) | RESPIRATORY_TRACT | Status: DC

## 2022-02-14 MED ORDER — PERFLUTREN LIPID MICROSPHERE
1.0000 mL | INTRAVENOUS | Status: AC | PRN
  Administered 2022-02-14: 2 mL via INTRAVENOUS

## 2022-02-14 NOTE — Progress Notes (Signed)
PROGRESS NOTE        PATIENT DETAILS Name: [name] Age: 81 y.o. Sex: female Date of Birth: [birth year] Admit Date: 02/13/2022 Admitting Physician Orma Flaming, MD [id number], [name], [name]  Brief Summary: Patient is a 80 y.o.  female with history of VTE, COPD with chronic hypoxic respiratory failure on home O2, recent hospitalization from 8/28-8/31 for hemoptysis due to lung abscess requiring bronchoscopy with negative BAL studies-due to persistent hemoptysis-Eliquis was held-presented to the hospital with exertional dyspnea-found to have pulmonary embolism.  See below for further details.  Significant events: 8/28-8/31>> hemoptysis due to lung abscess-s/p bronchoscopy-discharged on Augmentin-Eliquis held due to persistent large-volume hemoptysis. 9/18>> exertional dyspnea-submassive PE on CT angio-IV heparin started.  Significant studies: 9/18>> CT angio chest: Submassive PE, near complete resolution of RLL PNA.  Significant microbiology data: None  Procedures: None  Consults: PCCM.  Subjective: Exertional dyspnea-not short of breath-stable on 4-5 L of oxygen via nasal cannula.  Objective: Vitals: Blood pressure 123/87, pulse (!) 119, temperature (!) 97.5 F (36.4 C), temperature source Oral, resp. rate 20, height _0  (1.676 m), weight 72.1 kg, SpO2 91 %.   Exam: Gen Exam:Alert awake-not in any distress HEENT:atraumatic, normocephalic Chest: B/L clear to auscultation anteriorly CVS:S1S2 regular Abdomen:soft non tender, non distended Extremities:no edema Neurology: Non focal Skin: no rash  Pertinent Labs/Radiology:    Latest Ref Rng & Units 02/14/2022    7:51 AM 02/13/2022    2:29 PM 02/02/2022    4:38 PM  CBC  WBC 4.0 - 10.5 K/uL 9.9  11.9  8.7   Hemoglobin 12.0 - 15.0 g/dL 14.6  14.3  14.2   Hematocrit 36.0 - 46.0 % 44.8  44.4  43.5   Platelets 150 - 400 K/uL 306  290  359.0     Lab Results  Component Value Date   NA 137  02/14/2022   K 3.8 02/14/2022   CL 101 02/14/2022   CO2 22 02/14/2022      Assessment/Plan: Acute on chronic hypoxic respiratory failure due to pulmonary embolism: Continue IV heparin-await echo/Dopplers-attempt to titrate off FiO2.  Normally on around 3.5-4 L of oxygen at home.  Still short of breath with exertion-mobilize with PT/OT and see how she does.  COPD exacerbation: Some wheezing continues but overall better-continue steroids/bronchodilators.  Pulmonary abscess: Significant improvement on CT imaging-continue Augmentin until 9/21.  Recent bronchoscopy with BAL negative so far-however AFB/fungal cultures are still pending.  Minimally elevated troponin: Due to PE/demand ischemia-supportive care.  History of recurrent VTE: Will now require indefinite anticoagulation.  HTN: Stable-continue Maxide.  GERD: Continue PPI  Anxiety/depression: Stable-continue trazodone/lorazepam.  4 cm ascending aortic aneurysm: Needs annual CTA/MRA in the outpatient setting.  BMI: Estimated body mass index is 25.66 kg/m as calculated from the following:   Height as of this encounter: _1  (1.676 m).   Weight as of this encounter: 72.1 kg.   Code status:   Code Status: Full Code   DVT Prophylaxis: IV heparin   Family Communication: None at bedside   Disposition Plan: Status is: Inpatient Remains inpatient appropriate because: Submassive PE on IV heparin not yet stable for discharge.   Planned Discharge Destination:Home   Diet: Diet Order             Diet regular Room service appropriate? Yes; Fluid consistency: Thin  Diet effective now  Antimicrobial agents: Anti-infectives (From admission, onward)    Start     Dose/Rate Route Frequency Ordered Stop   02/13/22 1515  amoxicillin-clavulanate (AUGMENTIN) 875-125 MG per tablet 1 tablet        1 tablet Oral Every 12 hours 02/13/22 1507          MEDICATIONS: Scheduled Meds:   amoxicillin-clavulanate  1 tablet Oral Q12H   aspirin EC  81 mg Oral Daily   budesonide (PULMICORT) nebulizer solution  0.5 mg Nebulization BID   fluticasone furoate-vilanterol  1 puff Inhalation Daily   ipratropium-albuterol  3 mL Nebulization Q4H   LORazepam  1 mg Oral QHS   methylPREDNISolone (SOLU-MEDROL) injection  40 mg Intravenous Q12H   pantoprazole  40 mg Oral Daily   sodium chloride flush  3 mL Intravenous Q12H   traZODone  100 mg Oral QHS   triamterene-hydrochlorothiazide  0.5 tablet Oral Daily   umeclidinium bromide  1 puff Inhalation Daily   Continuous Infusions:  sodium chloride     heparin 1,000 Units/hr (02/14/22 0941)   PRN Meds:.sodium chloride, acetaminophen **OR** acetaminophen, albuterol, oxyCODONE, sodium chloride flush   I have personally reviewed following labs and imaging studies  LABORATORY DATA: CBC: Recent Labs  Lab 02/13/22 1429 02/14/22 0751  WBC 11.9* 9.9  NEUTROABS 9.8*  --   HGB 14.3 14.6  HCT 44.4 44.8  MCV 93.5 92.9  PLT 290 144    Basic Metabolic Panel: Recent Labs  Lab 02/13/22 1429 02/14/22 0751  NA 139 137  K 4.1 3.8  CL 103 101  CO2 26 22  GLUCOSE 131* 181*  BUN 20 18  CREATININE 0.96 0.95  CALCIUM 8.9 9.2    GFR: Estimated Creatinine Clearance: 49.6 mL/min (by C-G formula based on SCr of 0.95 mg/dL).  Liver Function Tests: No results for input(s): "AST", "ALT", "ALKPHOS", "BILITOT", "PROT", "ALBUMIN" in the last 168 hours. No results for input(s): "LIPASE", "AMYLASE" in the last 168 hours. No results for input(s): "AMMONIA" in the last 168 hours.  Coagulation Profile: Recent Labs  Lab 02/13/22 1708  INR 1.0    Cardiac Enzymes: No results for input(s): "CKTOTAL", "CKMB", "CKMBINDEX", "TROPONINI" in the last 168 hours.  BNP (last 3 results) No results for input(s): "PROBNP" in the last 8760 hours.  Lipid Profile: No results for input(s): "CHOL", "HDL", "LDLCALC", "TRIG", "CHOLHDL", "LDLDIRECT" in the last  72 hours.  Thyroid Function Tests: No results for input(s): "TSH", "T4TOTAL", "FREET4", "T3FREE", "THYROIDAB" in the last 72 hours.  Anemia Panel: No results for input(s): "VITAMINB12", "FOLATE", "FERRITIN", "TIBC", "IRON", "RETICCTPCT" in the last 72 hours.  Urine analysis:    Component Value Date/Time   COLORURINE YELLOW 11/22/2019 0539   APPEARANCEUR CLOUDY (A) 11/22/2019 0539   LABSPEC >1.030 (H) 11/22/2019 0539   PHURINE 5.5 11/22/2019 0539   GLUCOSEU NEGATIVE 11/22/2019 0539   HGBUR TRACE (A) 11/22/2019 0539   BILIRUBINUR SMALL (A) 11/22/2019 0539   KETONESUR NEGATIVE 11/22/2019 0539   PROTEINUR 30 (A) 11/22/2019 0539   NITRITE NEGATIVE 11/22/2019 0539   LEUKOCYTESUR SMALL (A) 11/22/2019 0539    Sepsis Labs: Lactic Acid, Venous No results found for: "LATICACIDVEN"  MICROBIOLOGY: No results found for this or any previous visit (from the past 240 hour(s)).  RADIOLOGY STUDIES/RESULTS: CT Angio Chest PE W and/or Wo Contrast  Result Date: 02/13/2022 CLINICAL DATA:  Respiratory distress, shortness of breath EXAM: CT ANGIOGRAPHY CHEST WITH CONTRAST TECHNIQUE: Multidetector CT imaging of the chest was performed using the standard protocol during  bolus administration of intravenous contrast. Multiplanar CT image reconstructions and MIPs were obtained to evaluate the vascular anatomy. RADIATION DOSE REDUCTION: This exam was performed according to the departmental dose-optimization program which includes automated exposure control, adjustment of the mA and/or kV according to patient size and/or use of iterative reconstruction technique. CONTRAST:  61m OMNIPAQUE IOHEXOL 350 MG/ML SOLN COMPARISON:  01/23/2022 FINDINGS: Cardiovascular: This is a technically adequate evaluation of the pulmonary vasculature. There are bilateral segmental pulmonary emboli throughout all vascular distributions. Moderate clot burden, with dilated RV/LV ratio measuring 1.69 consistent with right heart strain.  Normal caliber of the thoracic aorta. Atherosclerosis of the aorta and coronary vasculature. The heart is not enlarged. No pericardial effusion. Mediastinum/Nodes: No enlarged mediastinal, hilar, or axillary lymph nodes. Thyroid gland, trachea, and esophagus demonstrate no significant findings. Lungs/Pleura: Since the 01/23/2022 CT, the airspace disease within the right lower lobe has near completely resolved. There is a persistent cavitary lesion within the right lower lobe with persistent gas fluid level, measuring 7.1 x 3.7 x 4.7 cm, reference image 56/5. This has decreased in size since the previous exam. Background emphysema is again noted. 1 cm area of loculated fluid within the right major fissure. No other pleural effusion. No pneumothorax. Central airways are patent. Upper Abdomen: Reflux of contrast into the hepatic veins consistent with right heart strain described above. No other acute upper abdominal findings. Musculoskeletal: No acute or destructive bony lesions. Reconstructed images demonstrate no additional findings. Review of the MIP images confirms the above findings. IMPRESSION: 1. Positive for acute bilateral segmental PE with CT evidence of right heart strain (RV/LV Ratio = 1.69) consistent with at least submassive (intermediate risk) PE. The presence of right heart strain has been associated with an increased risk of morbidity and mortality. Please refer to the "Code PE Focused" order set in EPIC. 2. Near complete resolution of the right lower lobe pneumonia seen previously, with decreased size of the infected superior segment right lower lobe bulla. 3. Aortic Atherosclerosis (ICD10-I70.0) and Emphysema (ICD10-J43.9). Critical Value/emergent results were called by telephone at the time of interpretation on 02/13/2022 at 6:02 pm to provider DR ZReather Converse who verbally acknowledged these results. Electronically Signed   By: MRanda NgoM.D.   On: 02/13/2022 18:04   DG Chest Portable 1  View  Result Date: 02/13/2022 CLINICAL DATA:  Respiratory distress, shortness of breath EXAM: PORTABLE CHEST 1 VIEW COMPARISON:  02/07/2022 FINDINGS: Cavitary lesion again noted in the right mid lung, unchanged. Previously seen cavitary lesion in the right lower lobe not as well visualized on today's study. Left lung clear. Heart is normal size. Mediastinal contours within normal limits. No acute bony abnormality. IMPRESSION: Stable right mid lung cavitary lesion. Electronically Signed   By: KRolm BaptiseM.D.   On: 02/13/2022 13:39     LOS: 1 day   SOren Binet MD  [facility]    To contact the attending provider between 7A-7P or the covering provider during after hours 7P-7A, please log into the web site [email or web address] and access using universal [place] password for that web site. If you do not have the password, please call the hospital operator.  02/14/2022, 10:09 AM

## 2022-02-14 NOTE — Progress Notes (Signed)
ANTICOAGULATION CONSULT NOTE  Pharmacy Consult for Heparin Indication: PE Brief A/P: Heparin level supratherapeutic Decrease Heparin rate  Allergies  Allergen Reactions   Pneumococcal Vaccines Shortness Of Breath and Other (See Comments)    Couldn't breathe    Clarithromycin Swelling   Estradiol Rash    Reaction to cream   Sulfa Antibiotics Nausea And Vomiting and Swelling   Doxycycline Hyclate Other (See Comments)    stomach distress   Pneumococcal Polysaccharide Vaccine Other (See Comments)    Unknown   Sulfacetamide Sodium-Sulfur Other (See Comments)   Sulfonamide Derivatives Swelling    Patient Measurements: Height: '5\' 6"'$  (167.6 cm) Weight: 72.1 kg (159 lb) IBW/kg (Calculated) : 59.3 Heparin Dosing Weight: 72.1 kg  Vital Signs: Temp: 97.6 F (36.4 C) (09/18 2248) Temp Source: Oral (09/18 2248) BP: 123/87 (09/18 2248) Pulse Rate: 114 (09/18 2248)  Labs: Recent Labs    02/13/22 1429 02/13/22 1527 02/13/22 1708 02/13/22 2306  HGB 14.3  --   --   --   HCT 44.4  --   --   --   PLT 290  --   --   --   LABPROT  --   --  12.6  --   INR  --   --  1.0  --   HEPARINUNFRC  --   --   --  0.86*  CREATININE 0.96  --   --   --   TROPONINIHS  --  205*  --  171*    Estimated Creatinine Clearance: 49.1 mL/min (by C-G formula based on SCr of 0.96 mg/dL).  Assessment: 81 yo female with h/o VTE, now with new PE, for heparin   Goal of Therapy:  Heparin level 0.3-0.7 units/ml Monitor platelets by anticoagulation protocol: Yes   Plan:  Decrease Heparin 1100 units/hr Check heparin level in 8 hours.  Phillis Knack, PharmD, BCPS

## 2022-02-14 NOTE — Progress Notes (Signed)
BLE venous duplex has been completed.    Results can be found under chart review under CV PROC. 02/14/2022 6:17 PM Lucien Budney RVT, RDMS

## 2022-02-14 NOTE — Plan of Care (Signed)
  Problem: Education: Goal: Knowledge of General Education information will improve Description Including pain rating scale, medication(s)/side effects and non-pharmacologic comfort measures Outcome: Progressing   Problem: Health Behavior/Discharge Planning: Goal: Ability to manage health-related needs will improve Outcome: Progressing   

## 2022-02-14 NOTE — Progress Notes (Signed)
Reviewed, agree 

## 2022-02-14 NOTE — Progress Notes (Signed)
NAME:  [name], MRN:  [id number], DOB:  May 14, 1941, LOS: 1 ADMISSION DATE:  02/13/2022, CONSULTATION DATE:  9/18 REFERRING MD:  pickering , CHIEF COMPLAINT: acute on chronic resp failure   History of Present Illness:  This is a 81 year old female patient who was last seen in our office on 9/12 for follow-up in regards to hospitalization towards the end of August where she was admitted for a lung abscess.  She was discharged on August 31, sent home on 8 additional days of Augmentin, and seen in our office.  During her time from hospitalization her Eliquis had been on hold due to hemoptysis.  She was planned to follow-up with another chest x-ray in our office following completion of antibiotics, at time of visit she was felt to  be slowly improving. On 9/18 the office was called the patient was complaining of worsening shortness of breath.  Apparently over the p.m. hours oxygen tank stopped delivering oxygen.  Oximetry as low as 70%.  Her DME was able to get her oxygen, however later in the morning hours on 9/18 she was in significant respiratory distress with pulse oximetry as low as 82% on 5 L.  EMS was called, she was administered 10 mg of inhaled albuterol and Atrovent, she was placed on supplemental oxygen, and transferred to the emergency room for further evaluation. The patient was placed on supplemental oxygen, at 5 L still felt short of breath, and noted exertional dyspnea much greater than baseline because of this pulmonary was asked to evaluate.  On chest x-ray Left lung is clear.  There is unchanged right midlung cavitary lesion  Pertinent  Medical History  Chronic respiratory failure secondary to COPD Former smoker Lung abscess last evaluated January 23, 2022 requiring hospitalization   Was negative for pulmonary emboli, prior barium esophagram is negative.  Underwent bronchoscopy during hospitalization which was negative.  Was initially treated with IV antibiotics and sent home on  Augmentin. Allergic rhinitis Anemia from prior GI bleed Anxiety Depression Prior DVT involving the left lower extremity Pulmonary emboli 2020 Esophageal reflux  Diagnostic tests  CT angiogram: Obtained on 9/18.  Positive for acute bilateral subsegmental pulmonary emboli with RV/LV ratio 1.  6 9.  Near complete resolution of right lower lobe pneumonia from prior film.  Decrease in size of infected bulla/pulmonary abscess  Echocardiogram 9/19>>>  Lower extremity US 9/19>>> Interim History / Subjective:  Feels a little better Objective   Blood pressure 123/87, pulse (Abnormal) 119, temperature (Abnormal) 97.5 F (36.4 C), temperature source Oral, resp. rate 20, height '5\' 6"'$  (1.676 m), weight 72.1 kg, SpO2 91 %.       No intake or output data in the 24 hours ending 02/14/22 1027 Filed Weights   02/13/22 1316  Weight: 72.1 kg    Examination: General: Very pleasant 81 year old female resting in bed she is currently in no acute distress right normal cephalic atraumatic no jugular venous distention appreciated Pulmonary: Continues to have expiratory wheezes Cardiac: Regular rate and rhythm Abdomen: Soft nontender Extremities: Warm dry no significant edema Neuro: Awake oriented Resolved Hospital Problem list     Assessment & Plan:  Acute on chronic hypoxic respiratory failure secondary to Recurrent Pulmonary Emboli (submassive, but RV LV ratio likely exaggerated by underlying lung disease and her pulmonary embolus was subsegmental not lobar) and acute exacerbation of chronic obstructive pulmonary disease Plan Continue IV heparin, if no hemoptysis can start DOAC back tomorrow, she will need indefinite anticoagulation continue current steroid regimen  Continue scheduled bronchodilators Continue supplemental oxygen Okay to get her out of bed Follow-up echo and lower extremity ultrasound  Right midlung pulmonary abscess -Radiographically looks about the same Plan Continue  Augmentin, will plan for another 3 weeks of therapy with outpatient follow-up.  May be able to discontinue antibiotics sooner depending on radiographic improvement  Situational anxiety Plan as needed Ativan   History of hypertension Plan Continue Maxide   Best Practice (right click and "Reselect all SmartList Selections" daily)   Per primary    Critical care time:na   Erick Colace ACNP-BC [place] Pager # [phone] OR # [phone] if no answer

## 2022-02-14 NOTE — Progress Notes (Signed)
ANTICOAGULATION CONSULT NOTE  Pharmacy Consult for Heparin Indication: PE Brief A/P: Heparin level supratherapeutic Decrease Heparin rate  Allergies  Allergen Reactions   Pneumococcal Vaccines Shortness Of Breath and Other (See Comments)    Couldn't breathe    Clarithromycin Swelling   Estradiol Rash    Reaction to cream   Sulfa Antibiotics Nausea And Vomiting and Swelling   Doxycycline Hyclate Other (See Comments)    stomach distress   Pneumococcal Polysaccharide Vaccine Other (See Comments)    Unknown   Sulfacetamide Sodium-Sulfur Other (See Comments)   Sulfonamide Derivatives Swelling    Patient Measurements: Height: '5\' 6"'$  (167.6 cm) Weight: 72.1 kg (159 lb) IBW/kg (Calculated) : 59.3 Heparin Dosing Weight: 72.1 kg  Vital Signs: Temp: 97.5 F (36.4 C) (09/19 0755) Temp Source: Oral (09/19 0755) BP: 123/87 (09/19 0755) Pulse Rate: 119 (09/19 0755)  Labs: Recent Labs    02/13/22 1429 02/13/22 1527 02/13/22 1708 02/13/22 2306 02/14/22 0751  HGB 14.3  --   --   --  14.6  HCT 44.4  --   --   --  44.8  PLT 290  --   --   --  306  LABPROT  --   --  12.6  --   --   INR  --   --  1.0  --   --   HEPARINUNFRC  --   --   --  0.86* 0.72*  CREATININE 0.96  --   --   --   --   TROPONINIHS  --  205*  --  171*  --      Estimated Creatinine Clearance: 49.1 mL/min (by C-G formula based on SCr of 0.96 mg/dL).  Assessment: 81 yo female with h/o VTE, now with new PE, for heparin   Goal of Therapy:  Heparin level 0.3-0.7 units/ml Monitor platelets by anticoagulation protocol: Yes   Plan:  Decrease Heparin 1000 units/hr Follow up AM heparin level, CBC  Thank you Anette Guarneri, PharmD

## 2022-02-15 ENCOUNTER — Other Ambulatory Visit (HOSPITAL_COMMUNITY): Payer: Self-pay

## 2022-02-15 ENCOUNTER — Telehealth (HOSPITAL_COMMUNITY): Payer: Self-pay

## 2022-02-15 DIAGNOSIS — I10 Essential (primary) hypertension: Secondary | ICD-10-CM

## 2022-02-15 DIAGNOSIS — J9621 Acute and chronic respiratory failure with hypoxia: Secondary | ICD-10-CM | POA: Diagnosis not present

## 2022-02-15 DIAGNOSIS — I2699 Other pulmonary embolism without acute cor pulmonale: Secondary | ICD-10-CM | POA: Diagnosis not present

## 2022-02-15 DIAGNOSIS — F419 Anxiety disorder, unspecified: Secondary | ICD-10-CM | POA: Diagnosis not present

## 2022-02-15 DIAGNOSIS — J9601 Acute respiratory failure with hypoxia: Secondary | ICD-10-CM

## 2022-02-15 DIAGNOSIS — J441 Chronic obstructive pulmonary disease with (acute) exacerbation: Secondary | ICD-10-CM | POA: Diagnosis not present

## 2022-02-15 LAB — CBC
HCT: 45.5 % (ref 36.0–46.0)
Hemoglobin: 15 g/dL (ref 12.0–15.0)
MCH: 30.4 pg (ref 26.0–34.0)
MCHC: 33 g/dL (ref 30.0–36.0)
MCV: 92.3 fL (ref 80.0–100.0)
Platelets: 351 10*3/uL (ref 150–400)
RBC: 4.93 MIL/uL (ref 3.87–5.11)
RDW: 14.3 % (ref 11.5–15.5)
WBC: 15.3 10*3/uL — ABNORMAL HIGH (ref 4.0–10.5)
nRBC: 0 % (ref 0.0–0.2)

## 2022-02-15 LAB — HEPARIN LEVEL (UNFRACTIONATED): Heparin Unfractionated: 0.4 IU/mL (ref 0.30–0.70)

## 2022-02-15 MED ORDER — APIXABAN 5 MG PO TABS
10.0000 mg | ORAL_TABLET | Freq: Two times a day (BID) | ORAL | Status: DC
  Administered 2022-02-15 – 2022-02-17 (×5): 10 mg via ORAL
  Filled 2022-02-15 (×6): qty 2

## 2022-02-15 MED ORDER — PREDNISONE 20 MG PO TABS
40.0000 mg | ORAL_TABLET | Freq: Every day | ORAL | Status: DC
  Administered 2022-02-16: 40 mg via ORAL
  Filled 2022-02-15: qty 2

## 2022-02-15 MED ORDER — APIXABAN 5 MG PO TABS: 5.0000 mg | ORAL_TABLET | Freq: Two times a day (BID) | ORAL | Status: DC

## 2022-02-15 NOTE — Evaluation (Signed)
Occupational Therapy Evaluation Patient Details Name: [name] MRN: [id number] DOB: 03-28-1941 Today's Date: 02/15/2022   History of Present Illness 81 yo female presents to [facility] on 9/18 with ShOB with SPO2 in the 70s. Pt admitted with Acute on chronic respiratory failure with hypoxia secondary to submassive PE. on 9/20, LE dopplar reveals DVTs in R femoral vein, R popliteal vein, R posterior tib veins, R peroneal veins, and L femoral vein. PMH includes HTN, anxiety, DVT and PE, COPD with chronic respiratory failure and hypoxemia, right lower lung abscess in 12/2021 followed by pulm, hx of chronic PE not on [facility] after she had a bronchoscopy in august.   Clinical Impression   PTA, pt lived alone and was independent. Pt reports she has a good support system for when she is experiencing decreased activity tolerance. Upon eval, pt with consistently decreased SpO2 with movement down to 84, requiring up to 2 minutes for recovery after positional change. Requiring up to 8L O2 via [place] during standing to maintain SpO2 at 90. Due to pt O2, requiring supervision for all ADL, however, pt with excellent skill to self-monitor. Pt HR elevating up to 124 with movement, but pt reporting very anxious with hospitalization. Pt with good use of deep breathing techniques throughout. Presenting with decreased activity tolerance. Recommend discharge home with OP OT to optimize activity tolerance and independence in ADL and IADL.      Recommendations for follow up therapy are one component of a multi-disciplinary discharge planning process, led by the attending physician.  Recommendations may be updated based on patient status, additional functional criteria and insurance authorization.   Follow Up Recommendations  Outpatient OT    Assistance Recommended at Discharge Intermittent Supervision/Assistance  Patient can return home with the following A little help with walking and/or transfers;A little help with  bathing/dressing/bathroom;Assistance with cooking/housework;Assist for transportation;Help with stairs or ramp for entrance    Functional Status Assessment  Patient has had a recent decline in their functional status and demonstrates the ability to make significant improvements in function in a reasonable and predictable amount of time.  Equipment Recommendations  BSC/3in1    Recommendations for Other Services       Precautions / Restrictions Precautions Precautions: Fall Precaution Comments: Monitor SpO2 and HR Restrictions Weight Bearing Restrictions: No      Mobility Bed Mobility Overal bed mobility: Modified Independent             General bed mobility comments: Min I for increased time.    Transfers Overall transfer level: Needs assistance Equipment used: None Transfers: Sit to/from Stand Sit to Stand: Supervision           General transfer comment: For safety and management of lines.      Balance Overall balance assessment: Mild deficits observed, not formally tested                                         ADL either performed or assessed with clinical judgement   ADL Overall ADL's : Needs assistance/impaired Eating/Feeding: Set up;Sitting   Grooming: Supervision/safety;Standing   Upper Body Bathing: Set up;Sitting   Lower Body Bathing: Supervison/ safety;Sit to/from stand   Upper Body Dressing : Set up;Sitting   Lower Body Dressing: Supervision/safety;Sit to/from stand Lower Body Dressing Details (indicate cue type and reason): Donning socks sitting EOB Toilet Transfer: Min Patent examiner Details (indicate cue type  and reason): Min guard A for safety. Pr standing up to ~4 minutes, but O2 initially dropping then stabilizing at 90. Toileting- Clothing Manipulation and Hygiene: Supervision/safety;Sitting/lateral lean       Functional mobility during ADLs: Min guard General ADL Comments: Min gaurd A  during functional mobility for safety. Pt SpO2>90 on 8-10L while standing. Moving to recliner and back to 5L and stabilizing SpO2>90.     Vision Baseline Vision/History: 0 No visual deficits Ability to See in Adequate Light: 0 Adequate Patient Visual Report: No change from baseline Vision Assessment?: No apparent visual deficits Additional Comments: Using phone and ipad on arrival.     Perception     Praxis      Pertinent Vitals/Pain Pain Assessment Pain Assessment: No/denies pain     Hand Dominance Right   Extremity/Trunk Assessment Upper Extremity Assessment Upper Extremity Assessment: Overall WFL for tasks assessed   Lower Extremity Assessment Lower Extremity Assessment: Defer to PT evaluation   Cervical / Trunk Assessment Cervical / Trunk Assessment: Normal   Communication Communication Communication: No difficulties   Cognition Arousal/Alertness: Awake/alert Behavior During Therapy: WFL for tasks assessed/performed Overall Cognitive Status: Within Functional Limits for tasks assessed                                 General Comments: Following up to 3 step commands, alert and oriented. Very conversational. Self-monitoring of O2 level during session. Able to describe method for which she initiates rest break when O2 drops below desireable level at home.     General Comments  See ADL general comments. Pt with good skill to self-monitor O2. Min cues to initiate rest breaks.    Exercises     Shoulder Instructions      Home Living Family/patient expects to be discharged to:: Private residence Living Arrangements: Alone Available Help at Discharge: Family;Neighbor (daughter in [employer], 3 sons) Type of Home: House Home Access: Stairs to enter [employer] of Steps: 2 Entrance Stairs-Rails: None Home Layout: One level     Bathroom Shower/Tub: Tub/shower unit;Walk-in shower   Bathroom Toilet: Handicapped height     Home Equipment: None           Prior Functioning/Environment Prior Level of Function : Independent/Modified Independent             Mobility Comments: no AD ADLs Comments: Independent        OT Problem List: Decreased strength;Decreased activity tolerance;Impaired balance (sitting and/or standing);Cardiopulmonary status limiting activity      OT Treatment/Interventions: Self-care/ADL training;Therapeutic exercise;Energy conservation;Therapeutic activities;Patient/family education;Balance training    OT Goals(Current goals can be found in the care plan section) Acute Rehab OT Goals Patient Stated Goal: Decrease demand for supplemental O2 OT Goal Formulation: With patient Time For Goal Achievement: 03/01/22 Potential to Achieve Goals: Good  OT Frequency: Min 2X/week    Co-evaluation              AM-PAC OT "6 Clicks" Daily Activity     Outcome Measure Help from another person eating meals?: None Help from another person taking care of personal grooming?: A Little Help from another person toileting, which includes using toliet, bedpan, or urinal?: A Little Help from another person bathing (including washing, rinsing, drying)?: A Little Help from another person to put on and taking off regular upper body clothing?: A Little Help from another person to put on and taking off regular lower body clothing?: A  Little 6 Click Score: 19   End of Session Equipment Utilized During Treatment: Gait belt;Oxygen Nurse Communication: Mobility status;Other (comment) (O2 demand)  Activity Tolerance: Patient tolerated treatment well Patient left: in chair;with call bell/phone within reach;with chair alarm set  OT Visit Diagnosis: Unsteadiness on feet (R26.81);Muscle weakness (generalized) (M62.81);Other abnormalities of gait and mobility (R26.89)                Time: 9643-8381 OT Time Calculation (min): 40 min Charges:  OT General Charges $OT Visit: 1 Visit OT Evaluation $OT Eval Low Complexity: 1  Low OT Treatments $Self Care/Home Management : 23-37 mins  Shanda Howells, OTR/L [facility] Acute Rehabilitation Office: [phone]   [name] 02/15/2022, 2:40 PM

## 2022-02-15 NOTE — Progress Notes (Addendum)
PROGRESS NOTE        PATIENT DETAILS Name: [name] Age: 81 y.o. Sex: female Date of Birth: May 09, 1941 Admit Date: 02/13/2022 Admitting Physician Orma Flaming, MD [id number], [name], [name]  Brief Summary: Patient is a 81 y.o.  female with history of VTE, COPD with chronic hypoxic respiratory failure on home O2, recent hospitalization from 8/28-8/31 for hemoptysis due to lung abscess requiring bronchoscopy with negative BAL studies-due to persistent hemoptysis-Eliquis was held-presented to the hospital with exertional dyspnea-found to have pulmonary embolism.  See below for further details.  Significant events: 8/28-8/31>> hemoptysis due to lung abscess-s/p bronchoscopy-discharged on Augmentin-Eliquis held due to persistent large-volume hemoptysis. 9/18>> exertional dyspnea-submassive PE on CT angio-IV heparin started.  Significant studies: 9/18>> CT angio chest: Submassive PE, near complete resolution of RLL PNA. 9/19>> Echo: EF 55-73%, RV systolic function is severely reduced. 9/19>> bilateral lower extremity Doppler: DVT right femoral/popliteal/tibial/peroneal, left femoral vein  Significant microbiology data: None  Procedures: None  Consults: PCCM.  Subjective: Continues to complain of exertional dyspnea.  On 4-5 L of oxygen.  Objective: Vitals: Blood pressure (!) 151/107, pulse (!) 109, temperature 97.6 F (36.4 C), temperature source Oral, resp. rate 18, height _0  (1.676 m), weight 72.1 kg, SpO2 94 %.   Exam: Gen Exam:Alert awake-not in any distress HEENT:atraumatic, normocephalic Chest: B/L clear to auscultation anteriorly-some scattered wheezing. CVS:S1S2 regular Abdomen:soft non tender, non distended Extremities:no edema Neurology: Non focal Skin: no rash   Pertinent Labs/Radiology:    Latest Ref Rng & Units 02/15/2022    6:42 AM 02/14/2022    7:51 AM 02/13/2022    2:29 PM  CBC  WBC 4.0 - 10.5 K/uL 15.3  9.9  11.9    Hemoglobin 12.0 - 15.0 g/dL 15.0  14.6  14.3   Hematocrit 36.0 - 46.0 % 45.5  44.8  44.4   Platelets 150 - 400 K/uL 351  306  290     Lab Results  Component Value Date   NA 137 02/14/2022   K 3.8 02/14/2022   CL 101 02/14/2022   CO2 22 02/14/2022      Assessment/Plan: Acute on chronic hypoxic respiratory failure due to pulmonary embolism: Work-up as above-echo shows severely decreased RV systolic function-which I suspect is multifactorial from COPD/PE/lung abscess/bronchiectasis.  We will switch from IV heparin to Eliquis today.  Ambulate with PT/OT-suspect may require more oxygen at home.  Bilateral lower extremity DVT: Already on anticoagulation.  COPD exacerbation: Improved-minimal wheezing-continue bronchodilators-transition to prednisone.  Pulmonary abscess: Some improvement in CT imaging-PCCM recommending that we extend Augmentin by another 3 weeks.    Recent bronchoscopy with BAL negative so far-however AFB/fungal cultures are still pending.  Severe pulmonary hypertension: See above.  Suspect this is multifactorial-suspect this is not an acute issue.  Prior echo in 2020-showed low normal systolic function-with a RVSP of 33.7.  Discussed with Dr. Lavona Mound care-diuresis much as possible can pursue RHC as an outpatient.  Minimally elevated troponin: Due to PE/demand ischemia-supportive care.  History of recurrent VTE: Will now require indefinite anticoagulation.  HTN: Stable-continue Maxide.  GERD: Continue PPI  Anxiety/depression: Stable-continue trazodone/lorazepam.  4 cm ascending aortic aneurysm: Needs annual CTA/MRA in the outpatient setting.  BMI: Estimated body mass index is 25.66 kg/m as calculated from the following:   Height as of this encounter: _1  (1.676 m).   Weight  as of this encounter: 72.1 kg.   Code status:   Code Status: Full Code   DVT Prophylaxis: IV heparin   Family Communication: None at bedside   Disposition Plan: Status is:  Inpatient Remains inpatient appropriate because: Submassive PE on IV heparin not yet stable for discharge.   Planned Discharge Destination:Home   Diet: Diet Order             Diet regular Room service appropriate? Yes; Fluid consistency: Thin  Diet effective now                     Antimicrobial agents: Anti-infectives (From admission, onward)    Start     Dose/Rate Route Frequency Ordered Stop   02/13/22 1515  amoxicillin-clavulanate (AUGMENTIN) 875-125 MG per tablet 1 tablet        1 tablet Oral Every 12 hours 02/13/22 1507          MEDICATIONS: Scheduled Meds:  amoxicillin-clavulanate  1 tablet Oral Q12H   budesonide (PULMICORT) nebulizer solution  0.5 mg Nebulization BID   LORazepam  1 mg Oral QHS   methylPREDNISolone (SOLU-MEDROL) injection  40 mg Intravenous Q12H   pantoprazole  40 mg Oral Daily   sodium chloride flush  3 mL Intravenous Q12H   traZODone  100 mg Oral QHS   triamterene-hydrochlorothiazide  0.5 tablet Oral Daily   Continuous Infusions:  sodium chloride     heparin 1,000 Units/hr (02/15/22 0620)   PRN Meds:.sodium chloride, acetaminophen **OR** acetaminophen, albuterol, oxyCODONE, sodium chloride flush   I have personally reviewed following labs and imaging studies  LABORATORY DATA: CBC: Recent Labs  Lab 02/13/22 1429 02/14/22 0751 02/15/22 0642  WBC 11.9* 9.9 15.3*  NEUTROABS 9.8*  --   --   HGB 14.3 14.6 15.0  HCT 44.4 44.8 45.5  MCV 93.5 92.9 92.3  PLT 290 306 351     Basic Metabolic Panel: Recent Labs  Lab 02/13/22 1429 02/14/22 0751  NA 139 137  K 4.1 3.8  CL 103 101  CO2 26 22  GLUCOSE 131* 181*  BUN 20 18  CREATININE 0.96 0.95  CALCIUM 8.9 9.2     GFR: Estimated Creatinine Clearance: 49.6 mL/min (by C-G formula based on SCr of 0.95 mg/dL).  Liver Function Tests: No results for input(s): "AST", "ALT", "ALKPHOS", "BILITOT", "PROT", "ALBUMIN" in the last 168 hours. No results for input(s): "LIPASE",  "AMYLASE" in the last 168 hours. No results for input(s): "AMMONIA" in the last 168 hours.  Coagulation Profile: Recent Labs  Lab 02/13/22 1708  INR 1.0     Cardiac Enzymes: No results for input(s): "CKTOTAL", "CKMB", "CKMBINDEX", "TROPONINI" in the last 168 hours.  BNP (last 3 results) No results for input(s): "PROBNP" in the last 8760 hours.  Lipid Profile: No results for input(s): "CHOL", "HDL", "LDLCALC", "TRIG", "CHOLHDL", "LDLDIRECT" in the last 72 hours.  Thyroid Function Tests: No results for input(s): "TSH", "T4TOTAL", "FREET4", "T3FREE", "THYROIDAB" in the last 72 hours.  Anemia Panel: No results for input(s): "VITAMINB12", "FOLATE", "FERRITIN", "TIBC", "IRON", "RETICCTPCT" in the last 72 hours.  Urine analysis:    Component Value Date/Time   COLORURINE YELLOW 11/22/2019 0539   APPEARANCEUR CLOUDY (A) 11/22/2019 0539   LABSPEC >1.030 (H) 11/22/2019 0539   PHURINE 5.5 11/22/2019 0539   GLUCOSEU NEGATIVE 11/22/2019 0539   HGBUR TRACE (A) 11/22/2019 0539   BILIRUBINUR SMALL (A) 11/22/2019 [place] NEGATIVE 11/22/2019 0539   PROTEINUR 30 (A) 11/22/2019 6160  NITRITE NEGATIVE 11/22/2019 0539   LEUKOCYTESUR SMALL (A) 11/22/2019 0539    Sepsis Labs: Lactic Acid, Venous No results found for: "LATICACIDVEN"  MICROBIOLOGY: No results found for this or any previous visit (from the past 240 hour(s)).  RADIOLOGY STUDIES/RESULTS: VAS [place] LOWER EXTREMITY VENOUS (DVT)  Result Date: 02/15/2022  Lower Venous DVT Study Patient Name:  [name]  Date of Exam:   02/14/2022 Medical Rec #: [id number]      Accession #:    [id number] Date of Birth: 1941-01-31     Patient Gender: F Patient Age:   48 years Exam Location:  [facility] Procedure:      VAS [place] LOWER EXTREMITY VENOUS (DVT) Referring Phys: Orma Flaming --------------------------------------------------------------------------------  Indications: Pulmonary embolism. Other Indications: Recently taken off  of Eliquis due to hemoptysis now                    complaining of SOB/hypoxemia. Risk Factors: DVT HX of LLE. Comparison Study: Previous exam on 01/14/21 was positive for DVT in LLE. Previous                   LLE studies in 2020 & 2021 were also positive for DVT in LLE. Performing Technologist: Rogelia Rohrer RVT, RDMS  Examination Guidelines: A complete evaluation includes B-mode imaging, spectral Doppler, color Doppler, and power Doppler as needed of all accessible portions of each vessel. Bilateral testing is considered an integral part of a complete examination. Limited examinations for reoccurring indications may be performed as noted. The reflux portion of the exam is performed with the patient in reverse Trendelenburg.  +---------+---------------+---------+-----------+----------+--------------+ RIGHT    CompressibilityPhasicitySpontaneityPropertiesThrombus Aging +---------+---------------+---------+-----------+----------+--------------+ CFV      Full           No       Yes                                 +---------+---------------+---------+-----------+----------+--------------+ SFJ      Full                                                        +---------+---------------+---------+-----------+----------+--------------+ FV Prox  Full           No       Yes                                 +---------+---------------+---------+-----------+----------+--------------+ FV Mid   Full           No       Yes                                 +---------+---------------+---------+-----------+----------+--------------+ FV DistalNone           No       No                   Acute          +---------+---------------+---------+-----------+----------+--------------+ PFV      Full                                                        +---------+---------------+---------+-----------+----------+--------------+  POP      Partial        No       Yes                  Acute           +---------+---------------+---------+-----------+----------+--------------+ PTV      Partial        No       No                   Acute          +---------+---------------+---------+-----------+----------+--------------+ PERO     None           No       No                   Acute          +---------+---------------+---------+-----------+----------+--------------+   +---------+---------------+---------+-----------+----------+--------------+ LEFT     CompressibilityPhasicitySpontaneityPropertiesThrombus Aging +---------+---------------+---------+-----------+----------+--------------+ CFV      Full           No       Yes                                 +---------+---------------+---------+-----------+----------+--------------+ SFJ      Full                                                        +---------+---------------+---------+-----------+----------+--------------+ FV Prox  Full           No       Yes                                 +---------+---------------+---------+-----------+----------+--------------+ FV Mid   None           No       No                   Acute          +---------+---------------+---------+-----------+----------+--------------+ FV DistalNone           No       No                   Acute          +---------+---------------+---------+-----------+----------+--------------+ PFV      Full                                                        +---------+---------------+---------+-----------+----------+--------------+ POP      Full           Yes      Yes                                 +---------+---------------+---------+-----------+----------+--------------+ PTV      Full                                                        +---------+---------------+---------+-----------+----------+--------------+  PERO     Full                                                         +---------+---------------+---------+-----------+----------+--------------+     Summary: BILATERAL: -No evidence of popliteal cyst, bilaterally. RIGHT: - Findings consistent with acute deep vein thrombosis involving the right femoral vein, right popliteal vein, right posterior tibial veins, and right peroneal veins.  LEFT: - Findings consistent with acute deep vein thrombosis involving the left femoral vein.  *See table(s) above for measurements and observations. Electronically signed by Deitra Mayo MD on 02/15/2022 at 75:37:43 AM.    Final    ECHOCARDIOGRAM COMPLETE  Result Date: 02/14/2022    ECHOCARDIOGRAM REPORT   Patient Name:   [name] Date of Exam: 02/14/2022 Medical Rec #:  [id number]     Height:       66.0 in Accession #:    [id number]    Weight:       159.0 lb Date of Birth:  July 22, 1940    BSA:          1.814 m Patient Age:    44 years      BP:           129/92 mmHg Patient Gender: F             HR:           114 bpm. Exam Location:  Inpatient Procedure: 2D Echo, Color Doppler, Cardiac Doppler and Intracardiac            Opacification Agent Indications:    Acute respiratory distress  History:        Patient has prior history of Echocardiogram examinations, most                 recent 01/28/2019. Previous PE, Signs/Symptoms:Dyspnea; Risk                 Factors:Hypertension. DVT.  Sonographer:    Eartha Inch Referring Phys: [place] Comments: Technically difficult study due to poor echo windows. Image acquisition challenging due to respiratory motion and Image acquisition challenging due to patient body habitus. IMPRESSIONS  1. No significant left ventricular outflow tract gradient. Left ventricular ejection fraction, by estimation, is 70 to 75%. The left ventricle has hyperdynamic function. The left ventricle has no regional wall motion abnormalities. Left ventricular diastolic parameters are consistent with Grade I diastolic dysfunction (impaired relaxation).  There is the interventricular septum is flattened in systole and diastole, consistent with right ventricular pressure and volume overload.  2. Right ventricular systolic function is severely reduced. The right ventricular size is normal.  3. The mitral valve is normal in structure. No evidence of mitral valve regurgitation. No evidence of mitral stenosis.  4. The aortic valve is normal in structure. Aortic valve regurgitation is not visualized. No aortic stenosis is present.  5. Aortic dilatation noted. There is mild dilatation of the ascending aorta, measuring 40 mm.  6. The inferior vena cava is dilated in size with >50% respiratory variability, suggesting right atrial pressure of 8 mmHg. Comparison(s): RV function has decreased. FINDINGS  Left Ventricle: No significant left ventricular outflow tract gradient. Left ventricular ejection fraction, by estimation, is 70 to 75%. The left ventricle has hyperdynamic function. The left ventricle has  no regional wall motion abnormalities. Definity  contrast agent was given IV to delineate the left ventricular endocardial borders. The left ventricular internal cavity size was normal in size. There is no left ventricular hypertrophy. The interventricular septum is flattened in systole and diastole, consistent with right ventricular pressure and volume overload. Left ventricular diastolic parameters are consistent with Grade I diastolic dysfunction (impaired relaxation). Right Ventricle: The right ventricular size is normal. No increase in right ventricular wall thickness. Right ventricular systolic function is severely reduced. Left Atrium: Left atrial size was normal in size. Right Atrium: Right atrial size was normal in size. Pericardium: There is no evidence of pericardial effusion. Mitral Valve: The mitral valve is normal in structure. No evidence of mitral valve regurgitation. No evidence of mitral valve stenosis. Tricuspid Valve: The tricuspid valve is normal in  structure. Tricuspid valve regurgitation is mild . No evidence of tricuspid stenosis. Aortic Valve: The aortic valve is normal in structure. Aortic valve regurgitation is not visualized. No aortic stenosis is present. Pulmonic Valve: The pulmonic valve was normal in structure. Pulmonic valve regurgitation is not visualized. No evidence of pulmonic stenosis. Aorta: Aortic dilatation noted. There is mild dilatation of the ascending aorta, measuring 40 mm. Venous: The inferior vena cava is dilated in size with greater than 50% respiratory variability, suggesting right atrial pressure of 8 mmHg. IAS/Shunts: No atrial level shunt detected by color flow Doppler.  LEFT VENTRICLE PLAX 2D LVIDd:         3.30 cm   Diastology LVIDs:         3.00 cm   LV e' medial:    10.10 cm/s LV PW:         1.00 cm   LV E/e' medial:  10.9 LV IVS:        0.90 cm   LV e' lateral:   14.70 cm/s LVOT diam:     2.30 cm   LV E/e' lateral: 7.5 LVOT Area:     4.15 cm  RIGHT VENTRICLE             IVC RV S prime:     10.60 cm/s  IVC diam: 2.30 cm TAPSE (M-mode): 1.3 cm LEFT ATRIUM           Index       RIGHT ATRIUM          Index LA diam:      1.60 cm 0.88 cm/m  RA Area:     9.37 cm LA Vol (A4C): 14.9 ml 8.21 ml/m  RA Volume:   18.20 ml 10.03 ml/m   AORTA Ao Root diam: 3.20 cm Ao Asc diam:  4.00 cm MITRAL VALVE MV Area (PHT): 5.50 cm     SHUNTS MV Decel Time: 138 msec     Systemic Diam: 2.30 cm MV E velocity: 110.00 cm/s Candee Furbish MD Electronically signed by Candee Furbish MD Signature Date/Time: 02/14/2022/1:38:42 PM    Final    CT Angio Chest PE W and/or Wo Contrast  Result Date: 02/13/2022 CLINICAL DATA:  Respiratory distress, shortness of breath EXAM: CT ANGIOGRAPHY CHEST WITH CONTRAST TECHNIQUE: Multidetector CT imaging of the chest was performed using the standard protocol during bolus administration of intravenous contrast. Multiplanar CT image reconstructions and MIPs were obtained to evaluate the vascular anatomy. RADIATION DOSE  REDUCTION: This exam was performed according to the departmental dose-optimization program which includes automated exposure control, adjustment of the mA and/or kV according to patient size and/or use of iterative reconstruction  technique. CONTRAST:  1m OMNIPAQUE IOHEXOL 350 MG/ML SOLN COMPARISON:  01/23/2022 FINDINGS: Cardiovascular: This is a technically adequate evaluation of the pulmonary vasculature. There are bilateral segmental pulmonary emboli throughout all vascular distributions. Moderate clot burden, with dilated RV/LV ratio measuring 1.69 consistent with right heart strain. Normal caliber of the thoracic aorta. Atherosclerosis of the aorta and coronary vasculature. The heart is not enlarged. No pericardial effusion. Mediastinum/Nodes: No enlarged mediastinal, hilar, or axillary lymph nodes. Thyroid gland, trachea, and esophagus demonstrate no significant findings. Lungs/Pleura: Since the 01/23/2022 CT, the airspace disease within the right lower lobe has near completely resolved. There is a persistent cavitary lesion within the right lower lobe with persistent gas fluid level, measuring 7.1 x 3.7 x 4.7 cm, reference image 56/5. This has decreased in size since the previous exam. Background emphysema is again noted. 1 cm area of loculated fluid within the right major fissure. No other pleural effusion. No pneumothorax. Central airways are patent. Upper Abdomen: Reflux of contrast into the hepatic veins consistent with right heart strain described above. No other acute upper abdominal findings. Musculoskeletal: No acute or destructive bony lesions. Reconstructed images demonstrate no additional findings. Review of the MIP images confirms the above findings. IMPRESSION: 1. Positive for acute bilateral segmental PE with CT evidence of right heart strain (RV/LV Ratio = 1.69) consistent with at least submassive (intermediate risk) PE. The presence of right heart strain has been associated with an increased  risk of morbidity and mortality. Please refer to the "Code PE Focused" order set in EPIC. 2. Near complete resolution of the right lower lobe pneumonia seen previously, with decreased size of the infected superior segment right lower lobe bulla. 3. Aortic Atherosclerosis (ICD10-I70.0) and Emphysema (ICD10-J43.9). Critical Value/emergent results were called by telephone at the time of interpretation on 02/13/2022 at 6:02 pm to provider DR ZReather Converse who verbally acknowledged these results. Electronically Signed   By: MRanda NgoM.D.   On: 02/13/2022 18:04   DG Chest Portable 1 View  Result Date: 02/13/2022 CLINICAL DATA:  Respiratory distress, shortness of breath EXAM: PORTABLE CHEST 1 VIEW COMPARISON:  02/07/2022 FINDINGS: Cavitary lesion again noted in the right mid lung, unchanged. Previously seen cavitary lesion in the right lower lobe not as well visualized on today's study. Left lung clear. Heart is normal size. Mediastinal contours within normal limits. No acute bony abnormality. IMPRESSION: Stable right mid lung cavitary lesion. Electronically Signed   By: KRolm BaptiseM.D.   On: 02/13/2022 13:39     LOS: 2 days   SOren Binet MD  [facility]    To contact the attending provider between 7A-7P or the covering provider during after hours 7P-7A, please log into the web site [email or web address] and access using universal  password for that web site. If you do not have the password, please call the hospital operator.  02/15/2022, 11:03 AM

## 2022-02-15 NOTE — Progress Notes (Addendum)
  Transition of Care [facility]) Screening Note   Patient Details  Name: [name] Date of Birth: [birth year]   Transition of [facility] CM/SW Contact:    Cyndi Bender, RN Phone Number: 02/15/2022, 1:28 PM    Transition of Care Department [facility]) has reviewed patient and no TOC needs have been identified at this time. We will continue to monitor patient advancement through interdisciplinary progression rounds. If new patient transition needs arise, please place a TOC consult.

## 2022-02-15 NOTE — Telephone Encounter (Signed)
Pharmacy Patient Advocate Encounter  Insurance verification completed.    The patient is insured through [employer]   The patient is currently admitted and ran test claims for the following: Eliquis.  Copays and coinsurance results were relayed to Inpatient clinical team.

## 2022-02-15 NOTE — TOC Initial Note (Signed)
Transition of Care [facility]) - Initial/Assessment Note    Patient Details  Name: [name] MRN: [id number] Date of Birth: 07-17-1940  [facility] CM/SW Contact:    Cyndi Bender, RN Phone Number: 02/15/2022, 2:55 PM  Clinical Narrative:                 Spoke to patient at bedside regarding transition needs. Patient has home 02 with [employer] and states she is having issues with concentrator. Spoke to [place] with [employer] who states he will call his manager and make sure when patient is discharged they will have someone come out to assess home 02. Patient will need ambulatory saturation prior to discharge.  TOC will continue to follow.   Expected Discharge Plan: Home/Self Care Barriers to Discharge: Continued Medical Work up   Patient Goals and CMS Choice Patient states their goals for this hospitalization and ongoing recovery are:: return home      Expected Discharge Plan and Services Expected Discharge Plan: Home/Self Care   Discharge Planning Services: CM Consult                       DME Agency: [employer] Date DME Agency Contacted: 02/15/22 Time DME Agency Contacted: 51 Representative spoke with at DME Agency: [place] Arrangements/Services   Lives with:: Self Patient language and need for interpreter reviewed:: Yes Do you feel safe going back to the place where you live?: Yes      Need for Family Participation in Patient Care: Yes (Comment) Care giver support system in place?: Yes (comment) Current home services: DME (home 02) Criminal Activity/Legal Involvement Pertinent to Current Situation/Hospitalization: No - Comment as needed  Activities of Daily Living Home Assistive Devices/Equipment: Oxygen ADL Screening (condition at time of admission) Patient's cognitive ability adequate to safely complete daily activities?: Yes Is the patient deaf or have difficulty hearing?: No Does the patient have difficulty seeing, even  when wearing glasses/contacts?: No Does the patient have difficulty concentrating, remembering, or making decisions?: No Patient able to express need for assistance with ADLs?: Yes Does the patient have difficulty dressing or bathing?: No Independently performs ADLs?: Yes (appropriate for developmental age) Does the patient have difficulty walking or climbing stairs?: No Weakness of Legs: None Weakness of Arms/Hands: None  Permission Sought/Granted                  Emotional Assessment Appearance:: Appears stated age Attitude/Demeanor/Rapport: Engaged Affect (typically observed): Accepting Orientation: : Oriented to Situation, Oriented to  Time, Oriented to Place, Oriented to Self Alcohol / Substance Use: Not Applicable Psych Involvement: No (comment)  Admission diagnosis:  COPD exacerbation (HCC) [J44.1] Acute respiratory failure with hypoxia (HCC) [J96.01] Acute on chronic respiratory failure with hypoxia (HCC) [J96.21] Other acute pulmonary embolism with acute cor pulmonale (HCC) [I26.09] Patient Active Problem List   Diagnosis Date Noted   Acute on chronic respiratory failure with hypoxia secondary to submassive PE 02/13/2022   Acute pulmonary embolism ([place]) 02/13/2022   Pulmonary abscess ([place]) 02/13/2022   Chronic respiratory failure with hypoxia ([place]) 02/07/2022   Hemoptysis 02/07/2022   Essential hypertension 01/24/2022   Cavitary pneumonia 01/23/2022   Anxiety 11/16/2021   Nocturnal hypoxemia 05/26/2021   Acute respiratory failure due to COVID-19 [facility]) 06/11/2020   Acute lower GI bleeding 08/27/2019   History of pulmonary embolism 08/27/2019   Hyperglycemia 08/27/2019   Chronic pulmonary embolism ([place])  01/28/2019   Hypokalemia 01/28/2019   Retroperitoneal bleed 01/13/2019   SOB (shortness of breath) 05/27/2013   Myalgia 05/27/2013   COPD with acute exacerbation ([place]) 05/27/2013   Sciatica 12/16/2007   ALLERGIC RHINITIS 07/16/2007   BRONCHITIS 07/16/2007    ASTHMA 07/16/2007   Esophageal reflux 07/16/2007   PCP:  [name], [name] Pharmacy:   [facility] [address], [place] - [address] [place] [place] [address] Phone: [phone] Fax: [place] [address] Phone: [phone] Fax: [phone]  [facility] [address] [place] [address] Phone: [phone] Fax: [phone]     Social Determinants of Health (SDOH) Interventions    Readmission Risk Interventions     No data to display

## 2022-02-15 NOTE — Progress Notes (Signed)
PCCM:  Case discussed with Dr. Sloan Leiter. Continue plan set forth by Dr. Lamonte Sakai yesterday. She will need outpatient pulmonary follow up with Dr. [place] Bells and repeat CT imaging.   Please do not hesitate to call us if there are any issues. Pulmonary will sign off at this time.   Garner Nash, DO [facility] 02/15/2022 9:10 AM

## 2022-02-15 NOTE — Evaluation (Signed)
Physical Therapy Evaluation Patient Details Name: [name] MRN: [id number] DOB: Jun 23, 1940 Today'[name] Date: 02/15/2022  History of Present Illness  81 yo female [facility] 9/18 with ShOB with SPO2 in the 70s. Pt admitted with Acute on chronic respiratory failure with hypoxia secondary to submassive PE. on 9/20, LE dopplar reveals DVTs in R femoral vein, R popliteal vein, R posterior tib veins, R peroneal veins, and L femoral vein. PMH includes HTN, anxiety, DVT and PE, COPD with chronic respiratory failure and hypoxemia, right lower lung abscess in 12/2021 followed by pulm, hx of chronic PE not on [facility] after she had a bronchoscopy in august.  Clinical Impression   Pt presents with generalized weakness, impaired activity tolerance, and dyspnea one exertion with accompanying desats. Pt to benefit from acute PT to address deficits. Pt ambulated short hallway distance without AD and slowed pace, pt most limited by tachycardia up to 124 bpm and SPO2 drop to 86% on 4LO2. PT to progress mobility as tolerated, and will continue to follow acutely.         Recommendations for follow up therapy are one component of a multi-disciplinary discharge planning process, led by the attending physician.  Recommendations may be updated based on patient status, additional functional criteria and insurance authorization.  Follow Up Recommendations Outpatient PT (activity tolerance, strengthening, COPD monitoring)      Assistance Recommended at Discharge PRN  Patient can return home with the following  A little help with walking and/or transfers    Equipment Recommendations None recommended by PT  Recommendations for Other Services       Functional Status Assessment Patient has had a recent decline in their functional status and demonstrates the ability to make significant improvements in function in a reasonable and predictable amount of time.     Precautions / Restrictions Precautions Precautions:  Fall Precaution Comments: Monitor SpO2 and HR Restrictions Weight Bearing Restrictions: No      Mobility  Bed Mobility               General bed mobility comments: up in chair    Transfers Overall transfer level: Modified independent                 General transfer comment: for increased time, PT managing lines/leads    Ambulation/Gait Ambulation/Gait assistance: Supervision Gait Distance (Feet): 100 Feet Assistive device: None Gait Pattern/deviations: Step-through pattern, Decreased stride length Gait velocity: decr     General Gait Details: DOE 2/4, SPO39mn 87% on 4LO2, cues for breathing technique.HRmax 124 during mobility  Stairs            Wheelchair Mobility    Modified Rankin (Stroke Patients Only)       Balance Overall balance assessment: Mild deficits observed, not formally tested                                           Pertinent Vitals/Pain Pain Assessment Pain Assessment: No/denies pain    Home Living Family/patient expects to be discharged to:: Private residence Living Arrangements: Alone Available Help at Discharge: Family;Neighbor (daughter in lSports coach 3 sons) Type of Home: House Home Access: Stairs to enter Entrance Stairs-Rails: None Entrance Stairs-Number of Steps: 2   Home Layout: One level Home Equipment: None      Prior Function Prior Level of Function : Independent/Modified Independent  Mobility Comments: no AD ADLs Comments: Independent     Hand Dominance   Dominant Hand: Right    Extremity/Trunk Assessment   Upper Extremity Assessment Upper Extremity Assessment: Defer to OT evaluation    Lower Extremity Assessment Lower Extremity Assessment: Generalized weakness    Cervical / Trunk Assessment Cervical / Trunk Assessment: Normal  Communication   Communication: No difficulties  Cognition Arousal/Alertness: Awake/alert Behavior During Therapy: WFL for tasks  assessed/performed Overall Cognitive Status: Within Functional Limits for tasks assessed                                          General Comments General comments (skin integrity, edema, etc.): See ADL general comments. Pt with good skill to self-monitor O2. Min cues to initiate rest breaks.    Exercises     Assessment/Plan    PT Assessment Patient needs continued PT services  PT Problem List Decreased strength;Decreased mobility;Decreased activity tolerance;Decreased balance;Decreased knowledge of use of DME;Pain;Cardiopulmonary status limiting activity;Decreased safety awareness       PT Treatment Interventions DME instruction;Therapeutic activities;Gait training;Patient/family education;Therapeutic exercise;Balance training;Stair training;Functional mobility training    PT Goals (Current goals can be found in the Care Plan section)  Acute Rehab PT Goals PT Goal Formulation: With patient Time For Goal Achievement: 03/01/22 Potential to Achieve Goals: Good    Frequency Min 3X/week     Co-evaluation               AM-PAC PT "6 Clicks" Mobility  Outcome Measure Help needed turning from your back to your side while in a flat bed without using bedrails?: A Little Help needed moving from lying on your back to sitting on the side of a flat bed without using bedrails?: A Little Help needed moving to and from a bed to a chair (including a wheelchair)?: A Little Help needed standing up from a chair using your arms (e.g., wheelchair or bedside chair)?: A Little Help needed to walk in hospital room?: A Little Help needed climbing 3-5 steps with a railing? : A Little 6 Click Score: 18    End of Session Equipment Utilized During Treatment: Oxygen Activity Tolerance: Patient tolerated treatment well Patient left: in chair;with call bell/phone within reach;with family/visitor present Nurse Communication: Mobility status PT Visit Diagnosis: Other abnormalities  of gait and mobility (R26.89);Muscle weakness (generalized) (M62.81)    Time: 6283-1517 PT Time Calculation (min) (ACUTE ONLY): 20 min   Charges:   PT Evaluation $PT Eval Low Complexity: 1 Low        [name], PT DPT Acute Rehabilitation Services Pager [phone]  Office [phone]   [place] E Ruffin Pyo 02/15/2022, 3:20 PM

## 2022-02-16 DIAGNOSIS — I2699 Other pulmonary embolism without acute cor pulmonale: Secondary | ICD-10-CM | POA: Diagnosis not present

## 2022-02-16 DIAGNOSIS — J449 Chronic obstructive pulmonary disease, unspecified: Secondary | ICD-10-CM

## 2022-02-16 DIAGNOSIS — J9621 Acute and chronic respiratory failure with hypoxia: Secondary | ICD-10-CM | POA: Diagnosis not present

## 2022-02-16 DIAGNOSIS — J441 Chronic obstructive pulmonary disease with (acute) exacerbation: Principal | ICD-10-CM

## 2022-02-16 DIAGNOSIS — I82403 Acute embolism and thrombosis of unspecified deep veins of lower extremity, bilateral: Secondary | ICD-10-CM

## 2022-02-16 DIAGNOSIS — F419 Anxiety disorder, unspecified: Secondary | ICD-10-CM | POA: Diagnosis not present

## 2022-02-16 MED ORDER — REVEFENACIN 175 MCG/3ML IN SOLN
175.0000 ug | Freq: Every day | RESPIRATORY_TRACT | Status: DC
  Administered 2022-02-17: 175 ug via RESPIRATORY_TRACT
  Filled 2022-02-16: qty 3

## 2022-02-16 MED ORDER — FUROSEMIDE 10 MG/ML IJ SOLN
20.0000 mg | Freq: Once | INTRAMUSCULAR | Status: AC
  Administered 2022-02-16: 20 mg via INTRAVENOUS
  Filled 2022-02-16: qty 2

## 2022-02-16 MED ORDER — PREDNISONE 5 MG PO TABS
30.0000 mg | ORAL_TABLET | Freq: Every day | ORAL | Status: DC
  Administered 2022-02-17: 30 mg via ORAL
  Filled 2022-02-16: qty 2

## 2022-02-16 MED ORDER — ARFORMOTEROL TARTRATE 15 MCG/2ML IN NEBU
15.0000 ug | INHALATION_SOLUTION | Freq: Two times a day (BID) | RESPIRATORY_TRACT | Status: DC
  Administered 2022-02-16 – 2022-02-17 (×2): 15 ug via RESPIRATORY_TRACT
  Filled 2022-02-16 (×2): qty 2

## 2022-02-16 NOTE — Discharge Instructions (Signed)

## 2022-02-16 NOTE — Progress Notes (Addendum)
PROGRESS NOTE        PATIENT DETAILS Name: [name] Age: 81 y.o. Sex: female Date of Birth: [birth year] Admit Date: 02/13/2022 Admitting Physician Orma Flaming, MD TDV:[id number], [name], [name]  Brief Summary: Patient is a 81 y.o.  female with history of VTE, COPD with chronic hypoxic respiratory failure on home O2 (3-4 L), recent hospitalization from 8/28-8/31 for hemoptysis due to lung abscess requiring bronchoscopy with negative BAL studies-due to persistent hemoptysis-Eliquis was held-presented to the hospital with exertional dyspnea-found to have pulmonary embolism.  See below for further details.  Significant events: 8/28-8/31>> hemoptysis due to lung abscess-s/p bronchoscopy-discharged on Augmentin-Eliquis held due to persistent large-volume hemoptysis. 9/18>> exertional dyspnea-submassive PE on CT angio-IV heparin started.  Significant studies: 9/18>> CT angio chest: Submassive PE, near complete resolution of RLL PNA. 9/19>> Echo: EF 71-06%, RV systolic function is severely reduced. 9/19>> bilateral lower extremity Doppler: DVT right femoral/popliteal/tibial/peroneal, left femoral vein  Significant microbiology data: None  Procedures: None  Consults: PCCM.  Subjective: Still complains of exertional dyspnea-apparently gets short of breath just walking to the bathroom.  On 5 L of oxygen at rest.  Objective: Vitals: Blood pressure 106/81, pulse (!) 111, temperature 97.8 F (36.6 C), temperature source Oral, resp. rate 16, height _0  (1.676 m), weight 72.1 kg, SpO2 94 %.   Exam: Gen Exam:Alert awake-not in any distress HEENT:atraumatic, normocephalic Chest: B/L clear to auscultation anteriorly CVS:S1S2 regular Abdomen:soft non tender, non distended Extremities:no edema Neurology: Non focal Skin: no rash   Pertinent Labs/Radiology:    Latest Ref Rng & Units 02/15/2022    6:42 AM 02/14/2022    7:51 AM 02/13/2022    2:29 PM  CBC   WBC 4.0 - 10.5 K/uL 15.3  9.9  11.9   Hemoglobin 12.0 - 15.0 g/dL 15.0  14.6  14.3   Hematocrit 36.0 - 46.0 % 45.5  44.8  44.4   Platelets 150 - 400 K/uL 351  306  290     Lab Results  Component Value Date   NA 137 02/14/2022   K 3.8 02/14/2022   CL 101 02/14/2022   CO2 22 02/14/2022      Assessment/Plan: Acute on chronic hypoxic respiratory failure (3-4 L of O2 at home) due to pulmonary embolism: Remains on 5 L of oxygen-still short of breath upon just walking to the bathroom. Continue Eliquis-continue bronchodilators-continue tapering steroids.  Continue to ambulate with PT/OT.    Bilateral lower extremity DVT: Already on anticoagulation.  COPD exacerbation: Improved-minimal wheezing-continue bronchodilators-continue tapering prednisone.    Pulmonary abscess: No further hemoptysis for close to 2 weeks. Some improvement in CT imaging-PCCM recommending that we extend Augmentin by another 3 weeks.    Recent bronchoscopy with BAL negative so far-however AFB/fungal cultures are still pending.  Severe pulmonary hypertension: See above.  Suspect this is multifactorial-suspect this is not an acute issue.  Prior echo in 2020-showed low normal systolic function-with a RVSP of 33.7.  Discussed with Dr. Valeta Harms on 9/20-supportive care-diuresis much as possible can pursue RHC as an outpatient.  Minimally elevated troponin: Due to PE/demand ischemia-supportive care.  History of recurrent VTE: Will now require indefinite anticoagulation.  HTN: Stable-continue Maxide.  GERD: Continue PPI  Anxiety/depression: Stable-continue trazodone/lorazepam.  4 cm ascending aortic aneurysm: Needs annual CTA/MRA in the outpatient setting.  Palliative care: Full code-unfortunately appears to have COPD/lung  abscess/PNA/recurrent VTE now with severe pulmonary hypertension-very symptomatic with minimal exertion.  I have recommended that we start having goals of care discussion-she is agreeable-I have placed  palliative care consult.  BMI: Estimated body mass index is 25.66 kg/m as calculated from the following:   Height as of this encounter: _0  (1.676 m).   Weight as of this encounter: 72.1 kg.   Code status:   Code Status: Full Code   DVT Prophylaxis: IV heparin apixaban (ELIQUIS) tablet 10 mg  apixaban (ELIQUIS) tablet 5 mg   Family Communication: None at bedside   Disposition Plan: Status is: Inpatient Remains inpatient appropriate because: Submassive PE on IV heparin not yet stable for discharge.   Planned Discharge Destination:Home   Diet: Diet Order             Diet regular Room service appropriate? Yes; Fluid consistency: Thin  Diet effective now                     Antimicrobial agents: Anti-infectives (From admission, onward)    Start     Dose/Rate Route Frequency Ordered Stop   02/13/22 1515  amoxicillin-clavulanate (AUGMENTIN) 875-125 MG per tablet 1 tablet        1 tablet Oral Every 12 hours 02/13/22 1507          MEDICATIONS: Scheduled Meds:  amoxicillin-clavulanate  1 tablet Oral Q12H   apixaban  10 mg Oral BID   Followed by   Derrill Memo ON 02/22/2022] apixaban  5 mg Oral BID   arformoterol  15 mcg Nebulization BID   budesonide (PULMICORT) nebulizer solution  0.5 mg Nebulization BID   LORazepam  1 mg Oral QHS   pantoprazole  40 mg Oral Daily   predniSONE  40 mg Oral Q breakfast   revefenacin  175 mcg Nebulization Daily   sodium chloride flush  3 mL Intravenous Q12H   traZODone  100 mg Oral QHS   triamterene-hydrochlorothiazide  0.5 tablet Oral Daily   Continuous Infusions:  sodium chloride     PRN Meds:.sodium chloride, acetaminophen **OR** acetaminophen, albuterol, oxyCODONE, sodium chloride flush   I have personally reviewed following labs and imaging studies  LABORATORY DATA: CBC: Recent Labs  Lab 02/13/22 1429 02/14/22 0751 02/15/22 0642  WBC 11.9* 9.9 15.3*  NEUTROABS 9.8*  --   --   HGB 14.3 14.6 15.0  HCT 44.4 44.8  45.5  MCV 93.5 92.9 92.3  PLT 290 306 351     Basic Metabolic Panel: Recent Labs  Lab 02/13/22 1429 02/14/22 0751  NA 139 137  K 4.1 3.8  CL 103 101  CO2 26 22  GLUCOSE 131* 181*  BUN 20 18  CREATININE 0.96 0.95  CALCIUM 8.9 9.2     GFR: Estimated Creatinine Clearance: 49.6 mL/min (by C-G formula based on SCr of 0.95 mg/dL).  Liver Function Tests: No results for input(s): "AST", "ALT", "ALKPHOS", "BILITOT", "PROT", "ALBUMIN" in the last 168 hours. No results for input(s): "LIPASE", "AMYLASE" in the last 168 hours. No results for input(s): "AMMONIA" in the last 168 hours.  Coagulation Profile: Recent Labs  Lab 02/13/22 1708  INR 1.0     Cardiac Enzymes: No results for input(s): "CKTOTAL", "CKMB", "CKMBINDEX", "TROPONINI" in the last 168 hours.  BNP (last 3 results) No results for input(s): "PROBNP" in the last 8760 hours.  Lipid Profile: No results for input(s): "CHOL", "HDL", "LDLCALC", "TRIG", "CHOLHDL", "LDLDIRECT" in the last 72 hours.  Thyroid Function Tests: No results  for input(s): "TSH", "T4TOTAL", "FREET4", "T3FREE", "THYROIDAB" in the last 72 hours.  Anemia Panel: No results for input(s): "VITAMINB12", "FOLATE", "FERRITIN", "TIBC", "IRON", "RETICCTPCT" in the last 72 hours.  Urine analysis:    Component Value Date/Time   COLORURINE YELLOW 11/22/2019 0539   APPEARANCEUR CLOUDY (A) 11/22/2019 0539   LABSPEC >1.030 (H) 11/22/2019 0539   PHURINE 5.5 11/22/2019 0539   GLUCOSEU NEGATIVE 11/22/2019 0539   HGBUR TRACE (A) 11/22/2019 0539   BILIRUBINUR SMALL (A) 11/22/2019 0539   KETONESUR NEGATIVE 11/22/2019 0539   PROTEINUR 30 (A) 11/22/2019 0539   NITRITE NEGATIVE 11/22/2019 0539   LEUKOCYTESUR SMALL (A) 11/22/2019 0539    Sepsis Labs: Lactic Acid, Venous No results found for: "LATICACIDVEN"  MICROBIOLOGY: No results found for this or any previous visit (from the past 240 hour(s)).  RADIOLOGY STUDIES/RESULTS: VAS [place] LOWER EXTREMITY  VENOUS (DVT)  Result Date: 02/15/2022  Lower Venous DVT Study Patient Name:  [name]  Date of Exam:   02/14/2022 Medical Rec #: [id number]      Accession #:    [id number] Date of Birth: 27-Apr-1941     Patient Gender: F Patient Age:   75 years Exam Location:  [facility] Procedure:      VAS [place] LOWER EXTREMITY VENOUS (DVT) Referring Phys: Orma Flaming --------------------------------------------------------------------------------  Indications: Pulmonary embolism. Other Indications: Recently taken off of Eliquis due to hemoptysis now                    complaining of SOB/hypoxemia. Risk Factors: DVT HX of LLE. Comparison Study: Previous exam on 01/14/21 was positive for DVT in LLE. Previous                   LLE studies in 2020 & 2021 were also positive for DVT in LLE. Performing Technologist: Rogelia Rohrer RVT, RDMS  Examination Guidelines: A complete evaluation includes B-mode imaging, spectral Doppler, color Doppler, and power Doppler as needed of all accessible portions of each vessel. Bilateral testing is considered an integral part of a complete examination. Limited examinations for reoccurring indications may be performed as noted. The reflux portion of the exam is performed with the patient in reverse Trendelenburg.  +---------+---------------+---------+-----------+----------+--------------+ RIGHT    CompressibilityPhasicitySpontaneityPropertiesThrombus Aging +---------+---------------+---------+-----------+----------+--------------+ CFV      Full           No       Yes                                 +---------+---------------+---------+-----------+----------+--------------+ SFJ      Full                                                        +---------+---------------+---------+-----------+----------+--------------+ FV Prox  Full           No       Yes                                 +---------+---------------+---------+-----------+----------+--------------+ FV Mid    Full           No       Yes                                 +---------+---------------+---------+-----------+----------+--------------+  FV DistalNone           No       No                   Acute          +---------+---------------+---------+-----------+----------+--------------+ PFV      Full                                                        +---------+---------------+---------+-----------+----------+--------------+ POP      Partial        No       Yes                  Acute          +---------+---------------+---------+-----------+----------+--------------+ PTV      Partial        No       No                   Acute          +---------+---------------+---------+-----------+----------+--------------+ PERO     None           No       No                   Acute          +---------+---------------+---------+-----------+----------+--------------+   +---------+---------------+---------+-----------+----------+--------------+ LEFT     CompressibilityPhasicitySpontaneityPropertiesThrombus Aging +---------+---------------+---------+-----------+----------+--------------+ CFV      Full           No       Yes                                 +---------+---------------+---------+-----------+----------+--------------+ SFJ      Full                                                        +---------+---------------+---------+-----------+----------+--------------+ FV Prox  Full           No       Yes                                 +---------+---------------+---------+-----------+----------+--------------+ FV Mid   None           No       No                   Acute          +---------+---------------+---------+-----------+----------+--------------+ FV DistalNone           No       No                   Acute          +---------+---------------+---------+-----------+----------+--------------+ PFV      Full                                                         +---------+---------------+---------+-----------+----------+--------------+  POP      Full           Yes      Yes                                 +---------+---------------+---------+-----------+----------+--------------+ PTV      Full                                                        +---------+---------------+---------+-----------+----------+--------------+ PERO     Full                                                        +---------+---------------+---------+-----------+----------+--------------+     Summary: BILATERAL: -No evidence of popliteal cyst, bilaterally. RIGHT: - Findings consistent with acute deep vein thrombosis involving the right femoral vein, right popliteal vein, right posterior tibial veins, and right peroneal veins.  LEFT: - Findings consistent with acute deep vein thrombosis involving the left femoral vein.  *See table(s) above for measurements and observations. Electronically signed by Deitra Mayo MD on 02/15/2022 at 45:37:43 AM.    Final    ECHOCARDIOGRAM COMPLETE  Result Date: 02/14/2022    ECHOCARDIOGRAM REPORT   Patient Name:   [name] Date of Exam: 02/14/2022 Medical Rec #:  [id number]     Height:       66.0 in Accession #:    [id number]    Weight:       159.0 lb Date of Birth:  [birth year]    BSA:          1.814 m Patient Age:    24 years      BP:           129/92 mmHg Patient Gender: F             HR:           114 bpm. Exam Location:  Inpatient Procedure: 2D Echo, Color Doppler, Cardiac Doppler and Intracardiac            Opacification Agent Indications:    Acute respiratory distress  History:        Patient has prior history of Echocardiogram examinations, most                 recent 01/28/2019. Previous PE, Signs/Symptoms:Dyspnea; Risk                 Factors:Hypertension. DVT.  Sonographer:    Eartha Inch Referring Phys: [place] Comments: Technically difficult study due to poor echo windows. Image  acquisition challenging due to respiratory motion and Image acquisition challenging due to patient body habitus. IMPRESSIONS  1. No significant left ventricular outflow tract gradient. Left ventricular ejection fraction, by estimation, is 70 to 75%. The left ventricle has hyperdynamic function. The left ventricle has no regional wall motion abnormalities. Left ventricular diastolic parameters are consistent with Grade I diastolic dysfunction (impaired relaxation). There is the interventricular septum is flattened in systole and diastole, consistent with right ventricular pressure and volume overload.  2. Right ventricular systolic function is severely reduced.  The right ventricular size is normal.  3. The mitral valve is normal in structure. No evidence of mitral valve regurgitation. No evidence of mitral stenosis.  4. The aortic valve is normal in structure. Aortic valve regurgitation is not visualized. No aortic stenosis is present.  5. Aortic dilatation noted. There is mild dilatation of the ascending aorta, measuring 40 mm.  6. The inferior vena cava is dilated in size with >50% respiratory variability, suggesting right atrial pressure of 8 mmHg. Comparison(s): RV function has decreased. FINDINGS  Left Ventricle: No significant left ventricular outflow tract gradient. Left ventricular ejection fraction, by estimation, is 70 to 75%. The left ventricle has hyperdynamic function. The left ventricle has no regional wall motion abnormalities. Definity  contrast agent was given IV to delineate the left ventricular endocardial borders. The left ventricular internal cavity size was normal in size. There is no left ventricular hypertrophy. The interventricular septum is flattened in systole and diastole, consistent with right ventricular pressure and volume overload. Left ventricular diastolic parameters are consistent with Grade I diastolic dysfunction (impaired relaxation). Right Ventricle: The right ventricular size  is normal. No increase in right ventricular wall thickness. Right ventricular systolic function is severely reduced. Left Atrium: Left atrial size was normal in size. Right Atrium: Right atrial size was normal in size. Pericardium: There is no evidence of pericardial effusion. Mitral Valve: The mitral valve is normal in structure. No evidence of mitral valve regurgitation. No evidence of mitral valve stenosis. Tricuspid Valve: The tricuspid valve is normal in structure. Tricuspid valve regurgitation is mild . No evidence of tricuspid stenosis. Aortic Valve: The aortic valve is normal in structure. Aortic valve regurgitation is not visualized. No aortic stenosis is present. Pulmonic Valve: The pulmonic valve was normal in structure. Pulmonic valve regurgitation is not visualized. No evidence of pulmonic stenosis. Aorta: Aortic dilatation noted. There is mild dilatation of the ascending aorta, measuring 40 mm. Venous: The inferior vena cava is dilated in size with greater than 50% respiratory variability, suggesting right atrial pressure of 8 mmHg. IAS/Shunts: No atrial level shunt detected by color flow Doppler.  LEFT VENTRICLE PLAX 2D LVIDd:         3.30 cm   Diastology LVIDs:         3.00 cm   LV e' medial:    10.10 cm/s LV PW:         1.00 cm   LV E/e' medial:  10.9 LV IVS:        0.90 cm   LV e' lateral:   14.70 cm/s LVOT diam:     2.30 cm   LV E/e' lateral: 7.5 LVOT Area:     4.15 cm  RIGHT VENTRICLE             IVC RV S prime:     10.60 cm/s  IVC diam: 2.30 cm TAPSE (M-mode): 1.3 cm LEFT ATRIUM           Index       RIGHT ATRIUM          Index LA diam:      1.60 cm 0.88 cm/m  RA Area:     9.37 cm LA Vol (A4C): 14.9 ml 8.21 ml/m  RA Volume:   18.20 ml 10.03 ml/m   AORTA Ao Root diam: 3.20 cm Ao Asc diam:  4.00 cm MITRAL VALVE MV Area (PHT): 5.50 cm     SHUNTS MV Decel Time: 138 msec     Systemic Diam:  2.30 cm MV E velocity: 110.00 cm/s Candee Furbish MD Electronically signed by Candee Furbish MD Signature  Date/Time: 02/14/2022/1:38:42 PM    Final      LOS: 3 days   Oren Binet, MD  [facility]    To contact the attending provider between 7A-7P or the covering provider during after hours 7P-7A, please log into the web site [email or web address] and access using universal [place] password for that web site. If you do not have the password, please call the hospital operator.  02/16/2022, 11:36 AM

## 2022-02-16 NOTE — Progress Notes (Signed)
NAME:  [name], MRN:  [id number], DOB:  1940-07-18, LOS: 3 ADMISSION DATE:  02/13/2022, CONSULTATION DATE:  9/18 REFERRING MD:  pickering , CHIEF COMPLAINT: acute on chronic resp failure   History of Present Illness:  This is a 81 year old female patient who was last seen in our office on 9/12 for follow-up in regards to hospitalization towards the end of August where she was admitted for a lung abscess.  She was discharged on August 31, sent home on 8 additional days of Augmentin, and seen in our office.  During her time from hospitalization her Eliquis had been on hold due to hemoptysis.  She was planned to follow-up with another chest x-ray in our office following completion of antibiotics, at time of visit she was felt to  be slowly improving. On 9/18 the office was called the patient was complaining of worsening shortness of breath.  Apparently over the p.m. hours oxygen tank stopped delivering oxygen.  Oximetry as low as 70%.  Her DME was able to get her oxygen, however later in the morning hours on 9/18 she was in significant respiratory distress with pulse oximetry as low as 82% on 5 L.  EMS was called, she was administered 10 mg of inhaled albuterol and Atrovent, she was placed on supplemental oxygen, and transferred to the emergency room for further evaluation. The patient was placed on supplemental oxygen, at 5 L still felt short of breath, and noted exertional dyspnea much greater than baseline because of this pulmonary was asked to evaluate.  On chest x-ray Left lung is clear.  There is unchanged right midlung cavitary lesion  Pertinent  Medical History  Chronic respiratory failure secondary to COPD Former smoker Lung abscess last evaluated January 23, 2022 requiring hospitalization   Was negative for pulmonary emboli, prior barium esophagram is negative.  Underwent bronchoscopy during hospitalization which was negative.  Was initially treated with IV antibiotics and sent home on  Augmentin. Allergic rhinitis Anemia from prior GI bleed Anxiety Depression Prior DVT involving the left lower extremity Pulmonary emboli 2020 Esophageal reflux  Diagnostic tests  CT angiogram: Obtained on 9/18.  Positive for acute bilateral subsegmental pulmonary emboli with RV/LV ratio 1.  6 9.  Near complete resolution of right lower lobe pneumonia from prior film.  Decrease in size of infected bulla/pulmonary abscess  Echocardiogram 9/19>>>  Lower extremity US 9/19>>> Interim History / Subjective:   She feels ok. Anxious about everything going on. Family at bedside   Objective   Blood pressure 106/81, pulse (!) 111, temperature 97.8 F (36.6 C), temperature source Oral, resp. rate 16, height '5\' 6"'$  (1.676 m), weight 72.1 kg, SpO2 94 %.       No intake or output data in the 24 hours ending 02/16/22 1257 Filed Weights   02/13/22 1316  Weight: 72.1 kg    Examination: General: pleasant elderly fm, resting in bed, family at beside  Pulmonary: diminshed breath sounds bilaterally, no crackles no wheeze  Cardiac: RRR s1 s2  Abdomen: soft nt nd  Extremities: no edema  Neuro: awake alert following commands no deficit   Resolved Hospital Problem list     Assessment & Plan:   Acute on chronic hypoxic respiratory failure secondary to Recurrent Pulmonary Emboli (submassive, but RV LV ratio likely exaggerated by underlying lung disease and her pulmonary embolus was subsegmental not lobar) Left Femoral Vein DVT  Acute exacerbation of chronic obstructive pulmonary disease Plan: Continue eliquis  Taper to oral prednisone, decrease by  $'10mg'a$  every 7 days  Continue scheduled bronchodilators  Wean supplemental oxygen to maintain sats >88%  OOB and up in chair  Needs to continue to walk   Right midlung pulmonary abscess -Radiographically looks about the same Plan Continue Augmentin, will plan for another 3 weeks of therapy with outpatient follow-up.   Will need outpatient follow  up imaging  Already has appt scheduled to see Dr. Lake Bells   Situational anxiety Plan as needed Ativan   History of hypertension Plan Continue Maxide   Best Practice (right click and "Reselect all SmartList Selections" daily)   Per primary   Garner Nash, DO [facility] 02/16/2022 12:57 PM

## 2022-02-16 NOTE — Consult Note (Signed)
Hospital Consult    Reason for Consult: Bilateral lower extremity DVTs Referring Physician: Family MRN #:  [id number]  History of Present Illness: This is a 81 y.o. female with severe COPD that vascular surgery was asked to evaluate for bilateral lower extremity DVTs.  She was recently admitted with hemoptysis last month and ultimately her Eliquis was held for several weeks and she had a broncoscopy.  She was readmitted with worsening COPD and now with pulmonary abscess.  Her Eliquis has been restarted which she is tolerating.  She was off blood thinner for about 2 weeks.  DVT study yesterday showed right popliteal and tibial vein DVT and on the left that showed mid to distal femoral vein.  Both common femoral veins were patent.  Patient is well-known to vascular and previously had a left lower extremity mechanical thrombectomy with stenting of her left common, external, and common femoral vein by [name] on 05/26/2019.  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  She denies any significant lower extremity edema like 2020 when she had a percutaneous intervention.  Past Medical History:  Diagnosis Date   Allergic rhinitis    Anemia due to GI blood loss    Anxiety    Asthma    Chronic airway obstruction, not elsewhere classified    Depressive disorder, not elsewhere classified    DVT (deep venous thrombosis) (HCC)    LLE DVT 01/13/19   Dyspnea    Esophageal reflux    Essential hypertension 08/27/2019   PE (pulmonary thromboembolism) ([place]) 01/29/2019   Pneumonia    Wears partial dentures     Past Surgical History:  Procedure Laterality Date   ABDOMINAL HYSTERECTOMY     ABDOMINAL HYSTERECTOMY     BRONCHIAL WASHINGS  01/24/2022   Procedure: BRONCHIAL WASHINGS;  Surgeon: Juanito Doom, MD;  Location: [place];  Service: Cardiopulmonary;;   CATARACT EXTRACTION W/ INTRAOCULAR LENS  IMPLANT, BILATERAL     CHOLECYSTECTOMY N/A 08/12/2021   Procedure:  LAPAROSCOPIC CHOLECYSTECTOMY;  Surgeon: Felicie Morn, MD;  Location: [facility];  Service: General;  Laterality: N/A;   COLONOSCOPY     COLONOSCOPY WITH PROPOFOL N/A 08/20/2019   Procedure: COLONOSCOPY WITH PROPOFOL;  Surgeon: Ronald Lobo, MD;  Location: [place];  Service: Endoscopy;  Laterality: N/A;   CYSTOSCOPY     DILATION AND CURETTAGE OF UTERUS     ECTROPION REPAIR Bilateral 05/12/2021   Procedure: REPAIR OF ECTROPION BY LATERAL TARSAL STRIP OF BILATERAL LOWER EYE LIDS;  Surgeon: Delia Chimes, MD;  Location: [place];  Service: Ophthalmology;  Laterality: Bilateral;   ESOPHAGOGASTRODUODENOSCOPY (EGD) WITH PROPOFOL N/A 08/20/2019   Procedure: ESOPHAGOGASTRODUODENOSCOPY (EGD) WITH PROPOFOL;  Surgeon: Ronald Lobo, MD;  Location: [place];  Service: Endoscopy;  Laterality: N/A;   HEMOSTASIS CONTROL  08/20/2019   Procedure: HEMOSTASIS CONTROL;  Surgeon: Ronald Lobo, MD;  Location: [facility];  Service: Endoscopy;;   INTRAVASCULAR ULTRASOUND/IVUS Left 05/26/2019   Procedure: Intravascular Ultrasound/IVUS;  Surgeon: Waynetta Sandy, MD;  Location: [place] [facility];  Service: Cardiovascular;  Laterality: Left;   IVC FILTER REMOVAL N/A 03/24/2019   Procedure: IVC FILTER REMOVAL;  Surgeon: Waynetta Sandy, MD;  Location: [place] [facility];  Service: Cardiovascular;  Laterality: N/A;   LACRIMAL TUBE INSERTION Right 05/12/2021   Procedure: RIGHT NASOLACRIMAL DUCT PROBE WITH MINI MONOKA STENT PLACEMENT;  Surgeon: Delia Chimes, MD;  Location: [place];  Service: Ophthalmology;  Laterality: Right;   LESION EXCISION WITH COMPLEX REPAIR  N/A 05/12/2021   Procedure: NASAL LESION EXCISION;  Surgeon: Delia Chimes, MD;  Location: [place];  Service: Ophthalmology;  Laterality: N/A;   LOWER EXTREMITY VENOGRAPHY Left 05/26/2019   Procedure: LOWER EXTREMITY VENOGRAPHY;  Surgeon: Waynetta Sandy, MD;  Location: [place] [facility];  Service: Cardiovascular;   Laterality: Left;   MULTIPLE TOOTH EXTRACTIONS     NASAL SINUS SURGERY     PERIPHERAL VASCULAR INTERVENTION Left 05/26/2019   Procedure: PERIPHERAL VASCULAR INTERVENTION;  Surgeon: Waynetta Sandy, MD;  Location: [place] [facility];  Service: Cardiovascular;  Laterality: Left;  lower extremity veins   PERIPHERAL VASCULAR THROMBECTOMY  05/26/2019   Procedure: PERIPHERAL VASCULAR THROMBECTOMY;  Surgeon: Waynetta Sandy, MD;  Location: [place] [facility];  Service: Cardiovascular;;   PTOSIS REPAIR Bilateral 05/12/2021   Procedure: EXTERNAL PTOSIS REPAIR LEVATOR ADVANCEDMENT/RESECTION OF BILATERAL UPPER EYE LIDS;  Surgeon: Delia Chimes, MD;  Location: [place];  Service: Ophthalmology;  Laterality: Bilateral;   TUBAL LIGATION     VENA CAVA FILTER PLACEMENT N/A 01/13/2019   Procedure: INSERTION VENA-CAVA FILTER;  Surgeon: Waynetta Sandy, MD;  Location: [place];  Service: Vascular;  Laterality: N/A;   VIDEO BRONCHOSCOPY N/A 01/24/2022   Procedure: VIDEO BRONCHOSCOPY WITHOUT FLUORO;  Surgeon: Juanito Doom, MD;  Location: [place];  Service: Cardiopulmonary;  Laterality: N/A;    Allergies  Allergen Reactions   Pneumococcal Vaccines Shortness Of Breath and Other (See Comments)    Couldn't breathe    Clarithromycin Swelling   Estradiol Rash    Reaction to cream   Sulfa Antibiotics Nausea And Vomiting and Swelling   Doxycycline Hyclate Other (See Comments)    stomach distress   Pneumococcal Polysaccharide Vaccine Other (See Comments)    Unknown   Sulfacetamide Sodium-Sulfur Other (See Comments)   Sulfonamide Derivatives Swelling    Prior to Admission medications   Medication Sig Start Date End Date Taking? Authorizing Provider  acetaminophen (TYLENOL) 500 MG tablet Take 500 mg by mouth every 6 (six) hours as needed for headache (pain).   Yes [provider]  albuterol (ACCUNEB) 0.63 MG/3ML nebulizer solution Take 1 ampule by nebulization every  6 (six) hours as needed for shortness of breath or wheezing. 11/14/21  Yes [provider]  albuterol (VENTOLIN HFA) 108 (90 Base) MCG/ACT inhaler Inhale 1-2 puffs into the lungs every 6 (six) hours as needed for wheezing or shortness of breath.   Yes [provider]  amoxicillin-clavulanate (AUGMENTIN) 875-125 MG tablet Take 1 tablet by mouth 2 (two) times daily for 21 days. 01/26/22 02/16/22 Yes Ghimire, Henreitta Leber, MD  aspirin EC 81 MG tablet Take 81 mg by mouth daily. Swallow whole.   Yes [provider]  Budeson-Glycopyrrol-Formoterol (BREZTRI AEROSPHERE) 160-9-4.8 MCG/ACT AERO Inhale 2 puffs into the lungs in the morning and at bedtime. 02/07/22  Yes Parrett, Tammy S, NP  LORazepam (ATIVAN) 1 MG tablet Take 1 mg by mouth at bedtime.   Yes [provider]  omeprazole (PRILOSEC) 20 MG capsule Take 20 mg by mouth daily. 03/26/20  Yes [provider]  traZODone (DESYREL) 100 MG tablet Take 100 mg by mouth at bedtime.   Yes [provider]  triamterene-hydrochlorothiazide (MAXZIDE-25) 37.5-25 MG tablet Take 0.5 tablets by mouth daily. 02/06/19  Yes [provider]  fluticasone-salmeterol (ADVAIR) 250-50 MCG/ACT AEPB Inhale 1 puff then rinse mouth, twice daily Patient not taking: Reported on 02/13/2022 09/29/20   Deneise Lever, MD    Social History  Socioeconomic History   Marital status: Single    Spouse name: Not on file   Number of children: Not on file   Years of education: Not on file   Highest education level: Not on file  Occupational History   Occupation: Disabled  Tobacco Use   Smoking status: Former    Packs/day: 0.25    Years: 40.00    Total pack years: 10.00    Types: Cigarettes    Quit date: 2000    Years since quitting: 23.7   Smokeless tobacco: Never  Vaping Use   Vaping Use: Never used  Substance and Sexual Activity   Alcohol use: No   Drug use: No   Sexual activity: Not on file  Other Topics Concern    Not on file  Social History Narrative   Not on file   Social Determinants of Health   Financial Resource Strain: Not on file  Food Insecurity: No Food Insecurity (02/13/2022)   Hunger Vital Sign    Worried About Running Out of Food in the Last Year: Never true    Ran Out of Food in the Last Year: Never true  Transportation Needs: No Transportation Needs (02/13/2022)   PRAPARE - Hydrologist (Medical): No    Lack of Transportation (Non-Medical): No  Physical Activity: Not on file  Stress: Not on file  Social Connections: Not on file  Intimate Partner Violence: Not At Risk (02/13/2022)   Humiliation, Afraid, Rape, and Kick questionnaire    Fear of Current or Ex-Partner: No    Emotionally Abused: No    Physically Abused: No    Sexually Abused: No     Family History  Problem Relation Age of Onset   Diabetes Mother    Coronary artery disease Mother    Emphysema Father    Coronary artery disease Brother     ROS: '[x]'$  Positive   '[ ]'$  Negative   '[ ]'$  All sytems reviewed and are negative  Cardiovascular: '[]'$  chest pain/pressure '[]'$  palpitations '[]'$  SOB lying flat '[]'$  DOE '[]'$  pain in legs while walking '[]'$  pain in legs at rest '[]'$  pain in legs at night '[]'$  non-healing ulcers '[]'$  hx of DVT '[]'$  swelling in legs  Pulmonary: '[]'$  productive cough '[]'$  asthma/wheezing '[]'$  home O2  Neurologic: '[]'$  weakness in '[]'$  arms '[]'$  legs '[]'$  numbness in '[]'$  arms '[]'$  legs '[]'$  hx of CVA '[]'$  mini stroke '[]'$ difficulty speaking or slurred speech '[]'$  temporary loss of vision in one eye '[]'$  dizziness  Hematologic: '[]'$  hx of cancer '[]'$  bleeding problems '[]'$  problems with blood clotting easily  Endocrine:   '[]'$  diabetes '[]'$  thyroid disease  GI '[]'$  vomiting blood '[]'$  blood in stool  GU: '[]'$  CKD/renal failure '[]'$  HD--'[]'$  M/W/F or '[]'$  T/T/S '[]'$  burning with urination '[]'$  blood in urine  Psychiatric: '[]'$  anxiety '[]'$  depression  Musculoskeletal: '[]'$  arthritis '[]'$  joint  pain  Integumentary: '[]'$  rashes '[]'$  ulcers  Constitutional: '[]'$  fever '[]'$  chills   Physical Examination  Vitals:   02/16/22 0811 02/16/22 0820  BP:  106/81  Pulse: (!) 114 (!) 111  Resp: 20 16  Temp:  97.8 F (36.6 C)  SpO2: 93% 94%   Body mass index is 25.66 kg/m.  General:  NAD Gait: Not observed HENT: WNL, normocephalic Cardiac: regular, without  Murmurs, rubs or gallops Abdomen:  soft, NT/ND Vascular Exam/Pulses: No significant lower extremity edema Bilateral DP pulses palpable Extremities: without ischemic changes Musculoskeletal: no muscle wasting or atrophy  Neurologic: A&O X 3; Appropriate Affect ; SENSATION: normal; MOTOR FUNCTION:  moving all extremities equally. Speech is fluent/normal  CBC    Component Value Date/Time   WBC 15.3 (H) 02/15/2022 0642   RBC 4.93 02/15/2022 0642   HGB 15.0 02/15/2022 0642   HCT 45.5 02/15/2022 0642   PLT 351 02/15/2022 0642   MCV 92.3 02/15/2022 0642   MCH 30.4 02/15/2022 0642   MCHC 33.0 02/15/2022 0642   RDW 14.3 02/15/2022 0642   LYMPHSABS 0.9 02/13/2022 1429   MONOABS 1.1 (H) 02/13/2022 1429   EOSABS 0.0 02/13/2022 1429   BASOSABS 0.0 02/13/2022 1429    BMET    Component Value Date/Time   NA 137 02/14/2022 0751   K 3.8 02/14/2022 0751   CL 101 02/14/2022 0751   CO2 22 02/14/2022 0751   GLUCOSE 181 (H) 02/14/2022 0751   BUN 18 02/14/2022 0751   CREATININE 0.95 02/14/2022 0751   CREATININE 0.80 07/28/2019 1014   CALCIUM 9.2 02/14/2022 0751   GFRNONAA >60 02/14/2022 0751   GFRNONAA >60 07/28/2019 1014   GFRAA >60 11/22/2019 0603   GFRAA >60 07/28/2019 1014    COAGS: Lab Results  Component Value Date   INR 1.0 02/13/2022   INR 1.1 01/13/2019   INR 0.9 07/18/2007     Non-Invasive Vascular Imaging:     Lower Venous DVT Study   Patient Name:  [name]  Date of Exam:   02/14/2022  Medical Rec #: [id number]      Accession #:    [id number]  Date of Birth: 1940/09/16     Patient Gender: F  Patient  Age:   6 years  Exam Location:  [facility]  Procedure:      VAS [place] LOWER EXTREMITY VENOUS (DVT)  Referring Phys: Orma Flaming    ---------------------------------------------------------------------------  -----     Indications: Pulmonary embolism.  Other Indications: Recently taken off of Eliquis due to hemoptysis now                     complaining of SOB/hypoxemia.   Risk Factors: DVT HX of LLE.  Comparison Study: Previous exam on 01/14/21 was positive for DVT in LLE.  Previous                    LLE studies in 2020 & 2021 were also positive for DVT in  LLE.   Performing Technologist: Rogelia Rohrer RVT, RDMS      Examination Guidelines:  A complete evaluation includes B-mode imaging, spectral Doppler, color  Doppler,  and power Doppler as needed of all accessible portions of each vessel.  Bilateral  testing is considered an integral part of a complete examination. Limited  examinations for reoccurring indications may be performed as noted. The  reflux  portion of the exam is performed with the patient in reverse  Trendelenburg.       +---------+---------------+---------+-----------+----------+--------------+   RIGHT    CompressibilityPhasicitySpontaneityPropertiesThrombus  Aging  +---------+---------------+---------+-----------+----------+--------------+   CFV      Full           No       Yes                                    +---------+---------------+---------+-----------+----------+--------------+   SFJ      Full                                                           +---------+---------------+---------+-----------+----------+--------------+  FV Prox  Full           No       Yes                                    +---------+---------------+---------+-----------+----------+--------------+   FV Mid   Full           No       Yes                                     +---------+---------------+---------+-----------+----------+--------------+   FV DistalNone           No       No                   Acute            +---------+---------------+---------+-----------+----------+--------------+   PFV      Full                                                           +---------+---------------+---------+-----------+----------+--------------+   POP      Partial        No       Yes                  Acute            +---------+---------------+---------+-----------+----------+--------------+   PTV      Partial        No       No                   Acute            +---------+---------------+---------+-----------+----------+--------------+   PERO     None           No       No                   Acute            +---------+---------------+---------+-----------+----------+--------------+            +---------+---------------+---------+-----------+----------+--------------+   LEFT     CompressibilityPhasicitySpontaneityPropertiesThrombus  Aging  +---------+---------------+---------+-----------+----------+--------------+   CFV      Full           No       Yes                                    +---------+---------------+---------+-----------+----------+--------------+   SFJ      Full                                                           +---------+---------------+---------+-----------+----------+--------------+   FV Prox  Full           No       Yes                                    +---------+---------------+---------+-----------+----------+--------------+  FV Mid   None           No       No                   Acute            +---------+---------------+---------+-----------+----------+--------------+   FV DistalNone           No       No                   Acute            +---------+---------------+---------+-----------+----------+--------------+   PFV       Full                                                           +---------+---------------+---------+-----------+----------+--------------+   POP      Full           Yes      Yes                                    +---------+---------------+---------+-----------+----------+--------------+   PTV      Full                                                           +---------+---------------+---------+-----------+----------+--------------+   PERO     Full                                                           +---------+---------------+---------+-----------+----------+--------------+                   Summary:  BILATERAL:  -No evidence of popliteal cyst, bilaterally.  RIGHT:  - Findings consistent with acute deep vein thrombosis involving the right  femoral vein, right popliteal vein, right posterior tibial veins, and  right peroneal veins.     LEFT:  - Findings consistent with acute deep vein thrombosis involving the left  femoral vein.       ASSESSMENT/PLAN: This is a 81 y.o. female with severe COPD that vascular was asked to evaluate for new bilateral lower extremity DVTs at the request of the family.  She has been readmitted with acute on chronic respiratory failure with COPD and also lung abscess.  Her anticoagulation was held for several weeks when she had hemoptysis and underwent bronchoscopy with pulmonology.  I discussed with her and her family that I suspect her recent hospitalization with holding her anticoagulation as the risk factor for her recurrent DVT.  She is now indicated for lifelong anticoagulation given multiple recurrent DVTs which she previously understood.  Discussed with family there is no indication for repeat percutaneous intervention given she does not have any evidence of proximal extension.  Both CFV's are patent on duplex.  No significant lower extremity edema.  She is currently tolerating  her Eliquis which is great.  She  has follow-up with [name] next month.  She was previously undergone left leg percutaneous mechanical thrombectomy with iliac vein stenting.  Marty Heck, MD [facility] Office: [place]

## 2022-02-16 NOTE — TOC Progression Note (Signed)
Transition of Care [facility]) - Progression Note    Patient Details  Name: [name] MRN: [id number] Date of Birth: 1940-11-18  [facility]) CM/SW [place], RN Phone Number: 02/16/2022, 12:17 PM  Clinical Narrative:     Damaris Schooner to patient regarding OP rehab recommendation. Patient is requesting TOC follow up with her when she is closer to discharge. Patient doesn't want to go to SNF but make consider home health. At this point to patient feels too weak to  go to OP rehab.  TOC will continue to follow.   Expected Discharge Plan: Home/Self Care Barriers to Discharge: Continued Medical Work up  Expected Discharge Plan and Services Expected Discharge Plan: Home/Self Care   Discharge Planning Services: CM Consult                       DME Agency: [employer] Date DME Agency Contacted: 02/15/22 Time DME Agency Contacted: 14 Representative spoke with at DME Agency: [place]             Social Determinants of Health ([place]) Interventions    Readmission Risk Interventions     No data to display

## 2022-02-16 NOTE — Progress Notes (Signed)
Daughter came to RN station requesting that Vascular be contacted per Pt request. States she spoke with Dr. Anastasia Pall office and they wanted RN to page in house vascular to assess pt. On called physician for vascular paged at [phone] and stated that pt will be added to list for rounds.

## 2022-02-17 ENCOUNTER — Other Ambulatory Visit (HOSPITAL_COMMUNITY): Payer: Self-pay

## 2022-02-17 DIAGNOSIS — J441 Chronic obstructive pulmonary disease with (acute) exacerbation: Secondary | ICD-10-CM | POA: Diagnosis not present

## 2022-02-17 DIAGNOSIS — I2699 Other pulmonary embolism without acute cor pulmonale: Secondary | ICD-10-CM | POA: Diagnosis not present

## 2022-02-17 DIAGNOSIS — F419 Anxiety disorder, unspecified: Secondary | ICD-10-CM | POA: Diagnosis not present

## 2022-02-17 DIAGNOSIS — Z515 Encounter for palliative care: Secondary | ICD-10-CM

## 2022-02-17 DIAGNOSIS — J9621 Acute and chronic respiratory failure with hypoxia: Secondary | ICD-10-CM | POA: Diagnosis not present

## 2022-02-17 LAB — AFB CULTURE WITH SMEAR (NOT AT ARMC)
Acid Fast Culture: NEGATIVE
Acid Fast Smear: NEGATIVE

## 2022-02-17 MED ORDER — ALBUTEROL SULFATE (2.5 MG/3ML) 0.083% IN NEBU
INHALATION_SOLUTION | Freq: Four times a day (QID) | RESPIRATORY_TRACT | 12 refills | Status: DC | PRN
  Filled 2022-02-17: qty 90, 7d supply, fill #0

## 2022-02-17 MED ORDER — APIXABAN (ELIQUIS) VTE STARTER PACK (10MG AND 5MG)
ORAL_TABLET | ORAL | 0 refills | Status: DC
  Filled 2022-02-17: qty 74, 30d supply, fill #0

## 2022-02-17 MED ORDER — BREZTRI AEROSPHERE 160-9-4.8 MCG/ACT IN AERO
2.0000 | INHALATION_SPRAY | Freq: Two times a day (BID) | RESPIRATORY_TRACT | 2 refills | Status: DC
  Filled 2022-02-17: qty 10.7, 30d supply, fill #0

## 2022-02-17 MED ORDER — PREDNISONE 10 MG PO TABS
ORAL_TABLET | ORAL | 0 refills | Status: DC
  Filled 2022-02-17: qty 45, 21d supply, fill #0

## 2022-02-17 MED ORDER — AMOXICILLIN-POT CLAVULANATE 875-125 MG PO TABS
1.0000 | ORAL_TABLET | Freq: Two times a day (BID) | ORAL | 0 refills | Status: AC
  Filled 2022-02-17: qty 42, 21d supply, fill #0

## 2022-02-17 MED ORDER — ESCITALOPRAM OXALATE 10 MG PO TABS
10.0000 mg | ORAL_TABLET | Freq: Every day | ORAL | 2 refills | Status: DC
  Filled 2022-02-17: qty 30, 30d supply, fill #0

## 2022-02-17 NOTE — TOC Progression Note (Addendum)
Transition of Care [facility]) - Progression Note    Patient Details  Name: [name] MRN: [id number] Date of Birth: Nov 02, 1940  Transition of Care [facility]) CM/SW [place], RN Phone Number: 02/17/2022, 8:44 AM  Clinical Narrative:     Spoke to patient at bedside introduced self and explained role. Patient anxious about oxygen, she had had past problems with her pulse concentrator. She has increased demand up to 5L, Qualifications are pending from nursing.  Explained process of Home Health, Wants to go with a company that works best with her insurance.  Reached out to [employer] from [place], for oxygen and Amy from [place] for [facility] needs Patient states he nebulizer is very old, reached out to [employer] to bring a new one prior to DC. Set expectations for a after lunch DC to ensure that  all plans are set.  0910 Met with patient at bedside again to tell her oxygen would be at her house to set up in a hour and they will bring tanks to the hospital room for DC. She has transportation home.  44 Saw patient as she was being DC, PT will place in Oxygen qualifications 86% on 4 LPM  needs oxygen to keep saturations at least over 88-90%   Expected Discharge Plan: Home/Self Care Barriers to Discharge: No Barriers Identified  Expected Discharge Plan and Services Expected Discharge Plan: Home/Self Care   Discharge Planning Services: CM Consult                     DME Arranged: Nebulizer machine DME Agency: [employer] Date DME Agency Contacted: 02/17/22 Time DME Agency Contacted: [phone] Representative spoke with at DME Agency: lucretia HH Arranged: PT, OT, Respirator Therapy HH Agency: [facility] Date [place]: 02/17/22 Time [place]: [phone] Representative spoke with at [place] at [place]: Amy   Social Determinants of Health ([place]) Interventions    Readmission Risk Interventions     No data to display

## 2022-02-17 NOTE — Progress Notes (Signed)
Physical Therapy Treatment Patient Details Name: [name] MRN: [id number] DOB: Jan 08, 1941 Today's Date: 02/17/2022   History of Present Illness 81 yo female presents to [facility] on 9/18 with ShOB with SPO2 in the 70s. Pt admitted with Acute on chronic respiratory failure with hypoxia secondary to submassive PE. on 9/20, LE dopplar reveals DVTs in R femoral vein, R popliteal vein, R posterior tib veins, R peroneal veins, and L femoral vein. PMH includes HTN, anxiety, DVT and PE, COPD with chronic respiratory failure and hypoxemia, right lower lung abscess in 12/2021 followed by pulm, hx of chronic PE not on [facility] after she had a bronchoscopy in august.    PT Comments    Pt mobilizing at mod I level for increased time only and cues for breathing technique when pt desats, SPO38mn 86% on 4LO2 but pt in no distresss when has desats <88%. RN aware SPO2 86-91% on 4LO2 during mobility. Pt eager to d/c home.      Recommendations for follow up therapy are one component of a multi-disciplinary discharge planning process, led by the attending physician.  Recommendations may be updated based on patient status, additional functional criteria and insurance authorization.  Follow Up Recommendations  Outpatient PT     Assistance Recommended at Discharge PRN  Patient can return home with the following A little help with walking and/or transfers   Equipment Recommendations  None recommended by PT    Recommendations for Other Services       Precautions / Restrictions Precautions Precautions: Fall Precaution Comments: Monitor SpO2 and HR Restrictions Weight Bearing Restrictions: No     Mobility  Bed Mobility Overal bed mobility: Modified Independent             General bed mobility comments: up in chair    Transfers Overall transfer level: Modified independent Equipment used: None                    Ambulation/Gait Ambulation/Gait assistance: Modified independent  (Device/Increase time) Gait Distance (Feet): 220 Feet Assistive device: None Gait Pattern/deviations: Step-through pattern, Decreased stride length       General Gait Details: SPO217m 86% on 4LO2, briefly placed on 6LO2 and cues for breathign technique to recover. HRmax observed during mobility 130 bpm   Stairs             Wheelchair Mobility    Modified Rankin (Stroke Patients Only)       Balance Overall balance assessment: Mild deficits observed, not formally tested                                          Cognition Arousal/Alertness: Awake/alert Behavior During Therapy: WFL for tasks assessed/performed Overall Cognitive Status: Within Functional Limits for tasks assessed                                          Exercises      General Comments        Pertinent Vitals/Pain Pain Assessment Pain Assessment: No/denies pain    Home Living                          Prior Function            PT Goals (current  goals can now be found in the care plan section) Acute Rehab PT Goals PT Goal Formulation: With patient Time For Goal Achievement: 03/01/22 Potential to Achieve Goals: Good Progress towards PT goals: Progressing toward goals    Frequency    Min 3X/week      PT Plan Current plan remains appropriate    Co-evaluation              AM-PAC PT "6 Clicks" Mobility   Outcome Measure  Help needed turning from your back to your side while in a flat bed without using bedrails?: A Little Help needed moving from lying on your back to sitting on the side of a flat bed without using bedrails?: A Little Help needed moving to and from a bed to a chair (including a wheelchair)?: A Little Help needed standing up from a chair using your arms (e.g., wheelchair or bedside chair)?: A Little Help needed to walk in hospital room?: A Little Help needed climbing 3-5 steps with a railing? : A Little 6 Click Score:  18    End of Session Equipment Utilized During Treatment: Oxygen Activity Tolerance: Patient tolerated treatment well Patient left: in chair;with call bell/phone within reach;with family/visitor present Nurse Communication: Mobility status PT Visit Diagnosis: Other abnormalities of gait and mobility (R26.89);Muscle weakness (generalized) (M62.81)     Time: 1005-1020 PT Time Calculation (min) (ACUTE ONLY): 15 min  Charges:  $Therapeutic Activity: 8-22 mins                    Stacie Glaze, PT DPT Acute Rehabilitation Services Pager [phone]  Office [phone]    Harris E Ruffin Pyo 02/17/2022, 1:11 PM

## 2022-02-17 NOTE — Progress Notes (Signed)
Occupational Therapy Treatment Patient Details Name: [name] MRN: [id number] DOB: 14-Jan-1941 Today's Date: 02/17/2022   History of present illness 81 yo female presents to [facility] on 9/18 with ShOB with SPO2 in the 70s. Pt admitted with Acute on chronic respiratory failure with hypoxia secondary to submassive PE. on 9/20, LE dopplar reveals DVTs in R femoral vein, R popliteal vein, R posterior tib veins, R peroneal veins, and L femoral vein. PMH includes HTN, anxiety, DVT and PE, COPD with chronic respiratory failure and hypoxemia, right lower lung abscess in 12/2021 followed by pulm, hx of chronic PE not on [facility] after she had a bronchoscopy in august.   OT comments  Pt progressing towards established OT goals, maintaining SpO2>93 in chair throughout session focused on education regarding energy conservation. Handout provided and OT reviewing strategies pt may utilize at home to conserve energy, and thus participate in activities she finds meaningful. Pt receptive to education, but required additional education regarding when to implement, and that not all strategies recommended in handout have to be utilized. Pt agreeable to OP OT to optimize activity tolerance and participation in meaningful occupation while managing/monitoring SpO2 levels.    Recommendations for follow up therapy are one component of a multi-disciplinary discharge planning process, led by the attending physician.  Recommendations may be updated based on patient status, additional functional criteria and insurance authorization.    Follow Up Recommendations  Outpatient OT    Assistance Recommended at Discharge Intermittent Supervision/Assistance  Patient can return home with the following  A little help with walking and/or transfers;A little help with bathing/dressing/bathroom;Assistance with cooking/housework;Assist for transportation;Help with stairs or ramp for entrance   Equipment Recommendations  BSC/3in1     Recommendations for Other Services      Precautions / Restrictions Precautions Precautions: Fall Precaution Comments: Monitor SpO2 and HR Restrictions Weight Bearing Restrictions: No       Mobility Bed Mobility               General bed mobility comments: up in chair    Transfers                   General transfer comment: focus session on provision of education regarding energy conservation     Balance                                           ADL either performed or assessed with clinical judgement   ADL                                         General ADL Comments: Focus session on provision of education regarding energy conservation. Reviewing energy conservation handout, and pt verbalizing understanding with continued education that these strategies are for comfort and do not always have to be used. Pt agreeable to OP OT to optimize activity tolerance. Pt able to verbalize tasks she is aware make her oxygen demand higher.    Extremity/Trunk Assessment Upper Extremity Assessment Upper Extremity Assessment: Generalized weakness   Lower Extremity Assessment Lower Extremity Assessment: Defer to PT evaluation        Vision   Vision Assessment?: No apparent visual deficits Additional Comments: using phone on arrival   Perception Perception Perception: Not tested   Praxis Praxis Praxis:  Not tested    Cognition Arousal/Alertness: Awake/alert Behavior During Therapy: WFL for tasks assessed/performed Overall Cognitive Status: Within Functional Limits for tasks assessed                                          Exercises      Shoulder Instructions       General Comments      Pertinent Vitals/ Pain       Pain Assessment Pain Assessment: No/denies pain  Home Living                                          Prior Functioning/Environment              Frequency   Min 2X/week        Progress Toward Goals  OT Goals(current goals can now be found in the care plan section)  Progress towards OT goals: Progressing toward goals  Acute Rehab OT Goals Patient Stated Goal: go home OT Goal Formulation: With patient Time For Goal Achievement: 03/01/22 Potential to Achieve Goals: Good ADL Goals Pt Will Perform Grooming: with modified independence;standing;sitting Pt Will Transfer to Toilet: with modified independence;ambulating Pt Will Perform Tub/Shower Transfer: with modified independence;ambulating;Tub transfer;Shower transfer Additional ADL Goal #1: Pt will demonstrate use of energy conservation techniques in the context of ADL.  Plan Discharge plan remains appropriate    Co-evaluation                 AM-PAC OT "6 Clicks" Daily Activity     Outcome Measure   Help from another person eating meals?: None Help from another person taking care of personal grooming?: A Little Help from another person toileting, which includes using toliet, bedpan, or urinal?: A Little Help from another person bathing (including washing, rinsing, drying)?: A Little Help from another person to put on and taking off regular upper body clothing?: A Little Help from another person to put on and taking off regular lower body clothing?: A Little 6 Click Score: 19    End of Session    OT Visit Diagnosis: Unsteadiness on feet (R26.81);Muscle weakness (generalized) (M62.81);Other abnormalities of gait and mobility (R26.89)   Activity Tolerance Patient tolerated treatment well   Patient Left in chair;with call bell/phone within reach   Nurse Communication Mobility status        Time: 8638-1771 OT Time Calculation (min): 22 min  Charges: OT General Charges $OT Visit: 1 Visit OT Treatments $Self Care/Home Management : 8-22 mins  Shanda Howells, OTR/L [facility] Acute Rehabilitation Office: [phone]   [name] 02/17/2022, 12:59 PM

## 2022-02-17 NOTE — Progress Notes (Signed)
Discharge orders per md. Patient stable at time of discharge, oxygen teaching provided to patient. Walk test was performed by PT. Oxygen tank in patient's possession and patient's oxygen device from home is also in her possession. Review discharge instructions, medication, allowed time for questions, patient verbalizes understanding. PIV x2 discontinued prior to discharge. Patient is accompanied by loved one (daughter-in-law).

## 2022-02-17 NOTE — Progress Notes (Signed)
SATURATION QUALIFICATIONS: (This note is used to comply with regulatory documentation for home oxygen)  Patient Saturations on Room Air at Rest = --%  Patient Saturations on Room Air while Ambulating = --%  Patient Saturations on 4 Liters of oxygen while Ambulating = 86-91%  Please briefly explain why patient needs home oxygen: brief dips to 86%, RN aware and pt asymptomatic with no dyspnea.   Stacie Glaze, PT DPT Acute Rehabilitation Services Pager [phone]  Office [phone]

## 2022-02-17 NOTE — Discharge Summary (Signed)
PATIENT DETAILS Name: [name] Age: 81 y.o. Sex: female Date of Birth: Jun 07, 1940 MRN: [id number]. Admitting Physician: Orma Flaming, MD JYN:WGNFAOZ, [name], [name]  Admit Date: 02/13/2022 Discharge date: 02/17/2022  Recommendations for Outpatient Follow-up:  Follow up with PCP in 1-2 weeks Please obtain CMP/CBC in one week Please ensure follow-up with pulmonology, cardiology, palliative care.  Admitted From:  Home  Disposition: Home health   Discharge Condition: good  CODE STATUS:   Code Status: Full Code   Diet recommendation:  Diet Order             Diet - low sodium heart healthy           Diet regular Room service appropriate? Yes; Fluid consistency: Thin  Diet effective now                    Brief Summary: Patient is a 81 y.o.  female with history of VTE, COPD with chronic hypoxic respiratory failure on home O2 (3-4 L), recent hospitalization from 8/28-8/31 for hemoptysis due to lung abscess requiring bronchoscopy with negative BAL studies-due to persistent hemoptysis-Eliquis was held-presented to the hospital with exertional dyspnea-found to have pulmonary embolism.  See below for further details.   Significant events: 8/28-8/31>> hemoptysis due to lung abscess-s/p bronchoscopy-discharged on Augmentin-Eliquis held due to persistent large-volume hemoptysis. 9/18>> exertional dyspnea-submassive PE on CT angio-IV heparin started.   Significant studies: 9/18>> CT angio chest: Submassive PE, near complete resolution of RLL PNA. 9/19>> Echo: EF 30-86%, RV systolic function is severely reduced. 9/19>> bilateral lower extremity Doppler: DVT right femoral/popliteal/tibial/peroneal, left femoral vein   Significant microbiology data: None   Procedures: None   Consults: PCCM.  Brief Hospital Course: Acute on chronic hypoxic respiratory failure (3-4 L of O2 at home) due to pulmonary embolism: Better-less exertional dyspnea the day of discharge-back  on her usual 4 L of oxygen on room air.  Restarted on Eliquis-treated with steroids/bronchodilators.  Although improving-not yet at baseline but stable for discharge with continued close outpatient follow-up with her pulmonologist/PCP.  Will be on Eliquis/bronchodilators/tapering steroids.  Home health arranged.     Bilateral lower extremity DVT: Already on anticoagulation.   COPD exacerbation: Resolved-no wheezing-continue tapering prednisone.  Already on bronchodilators.    Pulmonary abscess: No further hemoptysis for close to 2 weeks. Some improvement in CT imaging-PCCM recommending that we extend Augmentin by another 3 weeks.  Recent bronchoscopy with BAL negative so far-however AFB/fungal cultures are still pending.   Severe pulmonary hypertension: See above.  Suspect this is multifactorial-suspect this is not an acute issue.  Prior echo in 2020-showed low normal systolic function-with a RVSP of 33.7.  Discussed with Dr. Valeta Harms on 9/20-supportive care-diuresis much as possible can pursue RHC as an outpatient.   Minimally elevated troponin: Due to PE/demand ischemia-supportive care.   History of recurrent VTE: Will now require indefinite anticoagulation.   HTN: Stable-continue Maxide.   GERD: Continue PPI   Anxiety/depression: Continue trazodone/lorazepam.  Apparently Lexapro was stopped a few months back-unclear why-I will resume at a lower dose-PCP to continue to optimize in the outpatient setting.  Her anxiety issues although stable-are slightly worse due to numerous medical issues.   4 cm ascending aortic aneurysm: Needs annual CTA/MRA in the outpatient setting.   Palliative care: Full code-unfortunately appears to have COPD/lung abscess/PNA/recurrent VTE now with severe pulmonary hypertension-plans were for an inpatient palliative care eval-but since patient is being discharged today-suspect this can be further pursued in the outpatient setting.  Patient appears to have advanced  pulmonary issues-and will benefit from goals of care discussion.   BMI: Estimated body mass index is 25.66 kg/m as calculated from the following:   Height as of this encounter: _0  (1.676 m).   Weight as of this encounter: 72.1 kg.      Discharge Diagnoses:  Principal Problem:   Acute on chronic respiratory failure with hypoxia secondary to submassive PE Active Problems:   COPD with acute exacerbation (HCC)   Pulmonary abscess (HCC)   Essential hypertension   Anxiety   Esophageal reflux   Acute pulmonary embolism (HCC)   COPD exacerbation (HCC)   Discharge Instructions:  Activity:  As tolerated   Discharge Instructions     (HEART FAILURE PATIENTS) Call MD:  Anytime you have any of the following symptoms: 1) 3 pound weight gain in 24 hours or 5 pounds in 1 week 2) shortness of breath, with or without a dry hacking cough 3) swelling in the hands, feet or stomach 4) if you have to sleep on extra pillows at night in order to breathe.   Complete by: As directed    Call MD for:  difficulty breathing, headache or visual disturbances   Complete by: As directed    Diet - low sodium heart healthy   Complete by: As directed    Discharge instructions   Complete by: As directed    Follow with Primary MD  Adria Dill [name], [name] in 1-2 weeks  Please follow-up with the pulmonologist and cardiologist in the next few weeks.  Continue home O2 at all times.  Please get a complete blood count and chemistry panel checked by your Primary MD at your next visit, and again as instructed by your Primary MD.  Get Medicines reviewed and adjusted: Please take all your medications with you for your next visit with your Primary MD  Laboratory/radiological data: Please request your Primary MD to go over all hospital tests and procedure/radiological results at the follow up, please ask your Primary MD to get all Hospital records sent to his/her office.  In some cases, they will be blood work,  cultures and biopsy results pending at the time of your discharge. Please request that your primary care M.D. follows up on these results.  Also Note the following: If you experience worsening of your admission symptoms, develop shortness of breath, life threatening emergency, suicidal or homicidal thoughts you must seek medical attention immediately by calling 911 or calling your MD immediately  if symptoms less severe.  You must read complete instructions/literature along with all the possible adverse reactions/side effects for all the Medicines you take and that have been prescribed to you. Take any new Medicines after you have completely understood and accpet all the possible adverse reactions/side effects.   Do not drive when taking Pain medications or sleeping medications (Benzodaizepines)  Do not take more than prescribed Pain, Sleep and Anxiety Medications. It is not advisable to combine anxiety,sleep and pain medications without talking with your primary care practitioner  Special Instructions: If you have smoked or chewed Tobacco  in the last 2 yrs please stop smoking, stop any regular Alcohol  and or any Recreational drug use.  Wear Seat belts while driving.  Please note: You were cared for by a hospitalist during your hospital stay. Once you are discharged, your primary care physician will handle any further medical issues. Please note that NO REFILLS for any discharge medications will be authorized once you are discharged, as  it is imperative that you return to your primary care physician (or establish a relationship with a primary care physician if you do not have one) for your post hospital discharge needs so that they can reassess your need for medications and monitor your lab values.   Increase activity slowly   Complete by: As directed       Allergies as of 02/17/2022       Reactions   Pneumococcal Vaccines Shortness Of Breath, Other (See Comments)   Couldn't breathe    Clarithromycin Swelling   Estradiol Rash   Reaction to cream   Sulfa Antibiotics Nausea And Vomiting, Swelling   Doxycycline Hyclate Other (See Comments)   stomach distress   Pneumococcal Polysaccharide Vaccine Other (See Comments)   Unknown   Sulfacetamide Sodium-sulfur Other (See Comments)   Sulfonamide Derivatives Swelling        Medication List     STOP taking these medications    aspirin EC 81 MG tablet   fluticasone-salmeterol 250-50 MCG/ACT Aepb Commonly known as: ADVAIR       TAKE these medications    acetaminophen 500 MG tablet Commonly known as: TYLENOL Take 500 mg by mouth every 6 (six) hours as needed for headache (pain).   albuterol 108 (90 Base) MCG/ACT inhaler Commonly known as: VENTOLIN HFA Inhale 1-2 puffs into the lungs every 6 (six) hours as needed for wheezing or shortness of breath.   albuterol 0.63 MG/3ML nebulizer solution Commonly known as: ACCUNEB Take 3 mLs (0.63 mg total) by nebulization every 6 (six) hours as needed for shortness of breath or wheezing.   amoxicillin-clavulanate 875-125 MG tablet Commonly known as: AUGMENTIN Take 1 tablet by mouth 2 (two) times daily for 21 days.   Apixaban Starter Pack (22m and 564m Commonly known as: ELIQUIS STARTER PACK Take as directed on package: start with two-67m64mablets twice daily for 7 days. On day 8, switch to one-67mg29mblet twice daily.   Breztri Aerosphere 160-9-4.8 MCG/ACT Aero Generic drug: Budeson-Glycopyrrol-Formoterol Inhale 2 puffs into the lungs in the morning and at bedtime.   escitalopram 10 MG tablet Commonly known as: Lexapro Take 1 tablet (10 mg total) by mouth daily.   LORazepam 1 MG tablet Commonly known as: ATIVAN Take 1 mg by mouth at bedtime.   omeprazole 20 MG capsule Commonly known as: PRILOSEC Take 20 mg by mouth daily.   predniSONE 10 MG tablet Commonly known as: DELTASONE Take 30 mg p.o. daily x1 week, then 20 mg p.o. daily for 1 week, then take 10 mg  p.o. daily for 1 week and stop. Start taking on: February 18, 2022   traZODone 100 MG tablet Commonly known as: DESYREL Take 100 mg by mouth at bedtime.   triamterene-hydrochlorothiazide 37.5-25 MG tablet Commonly known as: MAXZIDE-25 Take 0.5 tablets by mouth daily.               Durable Medical Equipment  (From admission, onward)           Start     Ordered   02/17/22 0842  For home use only DME Nebulizer machine  Once       Question Answer Comment  Patient needs a nebulizer to treat with the following condition COPD (chronic obstructive pulmonary disease) (HCC)SchoeneckLength of Need Lifetime      02/17/22 0841   02/17/22 0831  For home use only DME oxygen  Once       Question Answer Comment  Length of Need  Lifetime   Mode or (Route) Nasal cannula   Liters per Minute 5   Frequency Continuous (stationary and portable oxygen unit needed)   Oxygen conserving device Yes   Oxygen delivery system Gas      02/17/22 [place]. Follow up.   Why: your home health provider for PT OT and respiratory care. They will call you 24-48 hours after DC to set up  services Contact information: [place] [address] [phone]         [facility] Follow up.   Why: your oxygen and nebulizer Contact information: [address] [place] [address] [phone]         Juanito Doom, MD. Schedule an appointment as soon as possible for a visit in 1 week(s).   Specialty: Pulmonary Disease Why: post hospitalization appointment. Contact information: [place] [address] [phone]         Belva Crome, MD Follow up.   Specialty: Cardiology Why: Office will call with date/time, If you dont hear from them,please give them a call Contact information: [address] [place] [address] [phone]          [name], [place]. Schedule an appointment as soon as possible for a visit in 1 week(s).   Specialty: Internal Medicine Contact information: [address] [place] [address] [phone]                Allergies  Allergen Reactions   Pneumococcal Vaccines Shortness Of Breath and Other (See Comments)    Couldn't breathe    Clarithromycin Swelling   Estradiol Rash    Reaction to cream   Sulfa Antibiotics Nausea And Vomiting and Swelling   Doxycycline Hyclate Other (See Comments)    stomach distress   Pneumococcal Polysaccharide Vaccine Other (See Comments)    Unknown   Sulfacetamide Sodium-Sulfur Other (See Comments)   Sulfonamide Derivatives Swelling     Other Procedures/Studies: VAS [place] LOWER EXTREMITY VENOUS (DVT)  Result Date: 02/15/2022  Lower Venous DVT Study Patient Name:  [name]  Date of Exam:   02/14/2022 Medical Rec #: [id number]      Accession #:    [id number] Date of Birth: 1941-05-01     Patient Gender: F Patient Age:   82 years Exam Location:  [facility] Procedure:      VAS [place] LOWER EXTREMITY VENOUS (DVT) Referring Phys: Orma Flaming --------------------------------------------------------------------------------  Indications: Pulmonary embolism. Other Indications: Recently taken off of Eliquis due to hemoptysis now                    complaining of SOB/hypoxemia. Risk Factors: DVT HX of LLE. Comparison Study: Previous exam on 01/14/21 was positive for DVT in LLE. Previous                   LLE studies in 2020 & 2021 were also positive for DVT in LLE. Performing Technologist: Rogelia Rohrer RVT, RDMS  Examination Guidelines: A complete evaluation includes B-mode imaging, spectral Doppler, color Doppler, and power Doppler as needed of all accessible portions of each vessel. Bilateral testing is considered an integral part of a complete examination. Limited examinations for reoccurring indications may be performed as noted. The reflux  portion of the  exam is performed with the patient in reverse Trendelenburg.  +---------+---------------+---------+-----------+----------+--------------+ RIGHT    CompressibilityPhasicitySpontaneityPropertiesThrombus Aging +---------+---------------+---------+-----------+----------+--------------+ CFV      Full           No       Yes                                 +---------+---------------+---------+-----------+----------+--------------+ SFJ      Full                                                        +---------+---------------+---------+-----------+----------+--------------+ FV Prox  Full           No       Yes                                 +---------+---------------+---------+-----------+----------+--------------+ FV Mid   Full           No       Yes                                 +---------+---------------+---------+-----------+----------+--------------+ FV DistalNone           No       No                   Acute          +---------+---------------+---------+-----------+----------+--------------+ PFV      Full                                                        +---------+---------------+---------+-----------+----------+--------------+ POP      Partial        No       Yes                  Acute          +---------+---------------+---------+-----------+----------+--------------+ PTV      Partial        No       No                   Acute          +---------+---------------+---------+-----------+----------+--------------+ PERO     None           No       No                   Acute          +---------+---------------+---------+-----------+----------+--------------+   +---------+---------------+---------+-----------+----------+--------------+ LEFT     CompressibilityPhasicitySpontaneityPropertiesThrombus Aging +---------+---------------+---------+-----------+----------+--------------+ CFV      Full           No       Yes                                  +---------+---------------+---------+-----------+----------+--------------+ SFJ      Full                                                        +---------+---------------+---------+-----------+----------+--------------+  FV Prox  Full           No       Yes                                 +---------+---------------+---------+-----------+----------+--------------+ FV Mid   None           No       No                   Acute          +---------+---------------+---------+-----------+----------+--------------+ FV DistalNone           No       No                   Acute          +---------+---------------+---------+-----------+----------+--------------+ PFV      Full                                                        +---------+---------------+---------+-----------+----------+--------------+ POP      Full           Yes      Yes                                 +---------+---------------+---------+-----------+----------+--------------+ PTV      Full                                                        +---------+---------------+---------+-----------+----------+--------------+ PERO     Full                                                        +---------+---------------+---------+-----------+----------+--------------+     Summary: BILATERAL: -No evidence of popliteal cyst, bilaterally. RIGHT: - Findings consistent with acute deep vein thrombosis involving the right femoral vein, right popliteal vein, right posterior tibial veins, and right peroneal veins.  LEFT: - Findings consistent with acute deep vein thrombosis involving the left femoral vein.  *See table(s) above for measurements and observations. Electronically signed by Deitra Mayo MD on 02/15/2022 at 47:37:43 AM.    Final    ECHOCARDIOGRAM COMPLETE  Result Date: 02/14/2022    ECHOCARDIOGRAM REPORT   Patient Name:   [name] Date of Exam: 02/14/2022 Medical Rec #:   [id number]     Height:       66.0 in Accession #:    [id number]    Weight:       159.0 lb Date of Birth:  08-Aug-1940    BSA:          1.814 m Patient Age:    51 years      BP:           129/92 mmHg Patient Gender: F  HR:           114 bpm. Exam Location:  Inpatient Procedure: 2D Echo, Color Doppler, Cardiac Doppler and Intracardiac            Opacification Agent Indications:    Acute respiratory distress  History:        Patient has prior history of Echocardiogram examinations, most                 recent 01/28/2019. Previous PE, Signs/Symptoms:Dyspnea; Risk                 Factors:Hypertension. DVT.  Sonographer:    Eartha Inch Referring Phys: [place] Comments: Technically difficult study due to poor echo windows. Image acquisition challenging due to respiratory motion and Image acquisition challenging due to patient body habitus. IMPRESSIONS  1. No significant left ventricular outflow tract gradient. Left ventricular ejection fraction, by estimation, is 70 to 75%. The left ventricle has hyperdynamic function. The left ventricle has no regional wall motion abnormalities. Left ventricular diastolic parameters are consistent with Grade I diastolic dysfunction (impaired relaxation). There is the interventricular septum is flattened in systole and diastole, consistent with right ventricular pressure and volume overload.  2. Right ventricular systolic function is severely reduced. The right ventricular size is normal.  3. The mitral valve is normal in structure. No evidence of mitral valve regurgitation. No evidence of mitral stenosis.  4. The aortic valve is normal in structure. Aortic valve regurgitation is not visualized. No aortic stenosis is present.  5. Aortic dilatation noted. There is mild dilatation of the ascending aorta, measuring 40 mm.  6. The inferior vena cava is dilated in size with >50% respiratory variability, suggesting right atrial pressure of 8 mmHg.  Comparison(s): RV function has decreased. FINDINGS  Left Ventricle: No significant left ventricular outflow tract gradient. Left ventricular ejection fraction, by estimation, is 70 to 75%. The left ventricle has hyperdynamic function. The left ventricle has no regional wall motion abnormalities. Definity  contrast agent was given IV to delineate the left ventricular endocardial borders. The left ventricular internal cavity size was normal in size. There is no left ventricular hypertrophy. The interventricular septum is flattened in systole and diastole, consistent with right ventricular pressure and volume overload. Left ventricular diastolic parameters are consistent with Grade I diastolic dysfunction (impaired relaxation). Right Ventricle: The right ventricular size is normal. No increase in right ventricular wall thickness. Right ventricular systolic function is severely reduced. Left Atrium: Left atrial size was normal in size. Right Atrium: Right atrial size was normal in size. Pericardium: There is no evidence of pericardial effusion. Mitral Valve: The mitral valve is normal in structure. No evidence of mitral valve regurgitation. No evidence of mitral valve stenosis. Tricuspid Valve: The tricuspid valve is normal in structure. Tricuspid valve regurgitation is mild . No evidence of tricuspid stenosis. Aortic Valve: The aortic valve is normal in structure. Aortic valve regurgitation is not visualized. No aortic stenosis is present. Pulmonic Valve: The pulmonic valve was normal in structure. Pulmonic valve regurgitation is not visualized. No evidence of pulmonic stenosis. Aorta: Aortic dilatation noted. There is mild dilatation of the ascending aorta, measuring 40 mm. Venous: The inferior vena cava is dilated in size with greater than 50% respiratory variability, suggesting right atrial pressure of 8 mmHg. IAS/Shunts: No atrial level shunt detected by color flow Doppler.  LEFT VENTRICLE PLAX 2D LVIDd:          3.30 cm   Diastology  LVIDs:         3.00 cm   LV e' medial:    10.10 cm/s LV PW:         1.00 cm   LV E/e' medial:  10.9 LV IVS:        0.90 cm   LV e' lateral:   14.70 cm/s LVOT diam:     2.30 cm   LV E/e' lateral: 7.5 LVOT Area:     4.15 cm  RIGHT VENTRICLE             IVC RV S prime:     10.60 cm/s  IVC diam: 2.30 cm TAPSE (M-mode): 1.3 cm LEFT ATRIUM           Index       RIGHT ATRIUM          Index LA diam:      1.60 cm 0.88 cm/m  RA Area:     9.37 cm LA Vol (A4C): 14.9 ml 8.21 ml/m  RA Volume:   18.20 ml 10.03 ml/m   AORTA Ao Root diam: 3.20 cm Ao Asc diam:  4.00 cm MITRAL VALVE MV Area (PHT): 5.50 cm     SHUNTS MV Decel Time: 138 msec     Systemic Diam: 2.30 cm MV E velocity: 110.00 cm/s Candee Furbish MD Electronically signed by Candee Furbish MD Signature Date/Time: 02/14/2022/1:38:42 PM    Final    CT Angio Chest PE W and/or Wo Contrast  Result Date: 02/13/2022 CLINICAL DATA:  Respiratory distress, shortness of breath EXAM: CT ANGIOGRAPHY CHEST WITH CONTRAST TECHNIQUE: Multidetector CT imaging of the chest was performed using the standard protocol during bolus administration of intravenous contrast. Multiplanar CT image reconstructions and MIPs were obtained to evaluate the vascular anatomy. RADIATION DOSE REDUCTION: This exam was performed according to the departmental dose-optimization program which includes automated exposure control, adjustment of the mA and/or kV according to patient size and/or use of iterative reconstruction technique. CONTRAST:  27m OMNIPAQUE IOHEXOL 350 MG/ML SOLN COMPARISON:  01/23/2022 FINDINGS: Cardiovascular: This is a technically adequate evaluation of the pulmonary vasculature. There are bilateral segmental pulmonary emboli throughout all vascular distributions. Moderate clot burden, with dilated RV/LV ratio measuring 1.69 consistent with right heart strain. Normal caliber of the thoracic aorta. Atherosclerosis of the aorta and coronary vasculature. The heart is not  enlarged. No pericardial effusion. Mediastinum/Nodes: No enlarged mediastinal, hilar, or axillary lymph nodes. Thyroid gland, trachea, and esophagus demonstrate no significant findings. Lungs/Pleura: Since the 01/23/2022 CT, the airspace disease within the right lower lobe has near completely resolved. There is a persistent cavitary lesion within the right lower lobe with persistent gas fluid level, measuring 7.1 x 3.7 x 4.7 cm, reference image 56/5. This has decreased in size since the previous exam. Background emphysema is again noted. 1 cm area of loculated fluid within the right major fissure. No other pleural effusion. No pneumothorax. Central airways are patent. Upper Abdomen: Reflux of contrast into the hepatic veins consistent with right heart strain described above. No other acute upper abdominal findings. Musculoskeletal: No acute or destructive bony lesions. Reconstructed images demonstrate no additional findings. Review of the MIP images confirms the above findings. IMPRESSION: 1. Positive for acute bilateral segmental PE with CT evidence of right heart strain (RV/LV Ratio = 1.69) consistent with at least submassive (intermediate risk) PE. The presence of right heart strain has been associated with an increased risk of morbidity and mortality. Please refer to the "Code PE Focused" order  set in EPIC. 2. Near complete resolution of the right lower lobe pneumonia seen previously, with decreased size of the infected superior segment right lower lobe bulla. 3. Aortic Atherosclerosis (ICD10-I70.0) and Emphysema (ICD10-J43.9). Critical Value/emergent results were called by telephone at the time of interpretation on 02/13/2022 at 6:02 pm to provider DR Reather Converse, who verbally acknowledged these results. Electronically Signed   By: Randa Ngo M.D.   On: 02/13/2022 18:04   DG Chest Portable 1 View  Result Date: 02/13/2022 CLINICAL DATA:  Respiratory distress, shortness of breath EXAM: PORTABLE CHEST 1 VIEW  COMPARISON:  02/07/2022 FINDINGS: Cavitary lesion again noted in the right mid lung, unchanged. Previously seen cavitary lesion in the right lower lobe not as well visualized on today's study. Left lung clear. Heart is normal size. Mediastinal contours within normal limits. No acute bony abnormality. IMPRESSION: Stable right mid lung cavitary lesion. Electronically Signed   By: Rolm Baptise M.D.   On: 02/13/2022 13:39   DG Chest 2 View  Result Date: 02/07/2022 CLINICAL DATA:  Pneumonia EXAM: CHEST - 2 VIEW COMPARISON:  Previous chest radiographs done on 02/02/2022 and CT done on 01/23/2022 FINDINGS: There is a cavitary lesion with air-fluid level in the superior segment of right lower lobe measuring 11 cm in maximum diameter. There is a small 1.7 cm structure with air-fluid level in right lower lung field. These loculated fluid collections appear smaller. There is patchy alveolar density in the right parahilar region suggesting pneumonia in right lower lobe. Low position of diaphragms suggests COPD. No new focal infiltrates are seen in left lung. There is no pneumothorax. IMPRESSION: There is interval decrease in size of cavitary lesions in right mid and right lower lung fields. There is patchy infiltrate in right lower lobe. No new infiltrates are seen. Electronically Signed   By: Elmer Picker M.D.   On: 02/07/2022 10:44   DG Chest 2 View  Result Date: 02/02/2022 CLINICAL DATA:  Hemoptysis. EXAM: CHEST - 2 VIEW COMPARISON:  Chest radiograph 01/26/2022; CT chest 01/23/2022 FINDINGS: The heart size and mediastinal contours are within normal limits. Large cavitary lesion with air-fluid level in the right upper lung measures approximately 11.3 cm transverse dimension, similar to prior CT chest 01/15/2022. A nodular opacity spanning approximately 2.3 cm with possible air-fluid level at the right lung base is new compared to prior exam. The extensive airspace opacities previously seen in the right lung on  prior CT are significantly improved. Left lung is clear. Thin metallic wire overlying the left lower lobe pulmonary artery is unchanged. Breast augmentation. IMPRESSION: 1. Cavitary lesion with air-fluid level in the right upper lung posteriorly is stable compared to prior CT 01/15/2022. 2. A 2.3 cm nodular opacity at the right lung base with possible air-fluid level may represent a second cavitary lesion. This could be further evaluated with CT chest. 3. The airspace disease previously seen in the right lung is significantly improved compared to prior CT 01/23/2022. Electronically Signed   By: Ileana Roup M.D.   On: 02/02/2022 16:11   DG Chest 2 View  Result Date: 01/26/2022 CLINICAL DATA:  Hemoptysis with cough. EXAM: CHEST - 2 VIEW COMPARISON:  CTA chest 01/23/2022 FINDINGS: Lungs are hyperexpanded. Cavitary lesion with air-fluid level in the right mid lung was better characterized on recent chest CTA. Patchy airspace disease noted in the lung bases, right greater than left. No pleural effusion. Telemetry leads overlie the chest. IMPRESSION: Cavitary lesion in the right mid lung with air-fluid level, as  noted on recent chest CTA. Right greater than left patchy airspace disease in the lung bases without pleural effusion. Electronically Signed   By: Misty Stanley M.D.   On: 01/26/2022 12:17   DG ESOPHAGUS W SINGLE CM (SOL OR THIN BA)  Result Date: 01/25/2022 CLINICAL DATA:  Patient with a history of dysphagia. Patient complains of feeling like solids do not go down, coughing after eating solid foods, and regurgitation if she eats too much at once. Barium study requested. EXAM: ESOPHAGUS/BARIUM SWALLOW/TABLET STUDY TECHNIQUE: Single contrast examination was performed using thin liquid barium. This exam was performed by Soyla Dryer, NP, and was supervised and interpreted by Dr. Maree Erie. FLUOROSCOPY: Radiation Exposure Index (as provided by the fluoroscopic device): 21.10 mGy Kerma COMPARISON:  None  Available. FINDINGS: Swallowing: Appears normal. No vestibular penetration or aspiration seen. Pharynx: Unremarkable. Esophagus: Normal appearance. Esophageal motility: Poor primary peristalsis with tertiary contractions observed Hiatal Hernia: Small-moderate sized hiatal hernia observed Gastroesophageal reflux: None visualized. Ingested 7m barium tablet: Passed into the hiatal hernia Other: None. IMPRESSION: No aspiration observed. No stricture or mass seen in the esophagus. Poor primary peristalsis with tertiary contractions. Small to moderate-sized hiatal hernia present. 13 mm barium tablet passed easily into the hiatal hernia. Patient tolerated procedure well with no coughing or other complaints. Read by: JSoyla Dryer NP Electronically Signed   By: MNelson ChimesM.D.   On: 01/25/2022 11:35   CT Angio Chest PE W and/or Wo Contrast  Result Date: 01/23/2022 CLINICAL DATA:  Chest pain, cough, hemoptysis EXAM: CT ANGIOGRAPHY CHEST WITH CONTRAST TECHNIQUE: Multidetector CT imaging of the chest was performed using the standard protocol during bolus administration of intravenous contrast. Multiplanar CT image reconstructions and MIPs were obtained to evaluate the vascular anatomy. RADIATION DOSE REDUCTION: This exam was performed according to the departmental dose-optimization program which includes automated exposure control, adjustment of the mA and/or kV according to patient size and/or use of iterative reconstruction technique. CONTRAST:  851mOMNIPAQUE IOHEXOL 350 MG/ML SOLN COMPARISON:  Previous studies including the CT done on 06Jul 05, 2023nd chest radiographs done today FINDINGS: Cardiovascular: There is no evidence of pulmonary artery embolism. There is ectasia of the main pulmonary artery measuring 3.5 cm suggesting pulmonary arterial hypertension. There is homogeneous enhancement in thoracic aorta. There is ectasia of ascending thoracic aorta measuring 4 cm. Coronary artery calcifications are seen.  Mediastinum/Nodes: No significant lymphadenopathy is seen. Lungs/Pleura: There is interval appearance of large patchy alveolar infiltrate in right lower lobe suggesting pneumonia. There is 8.7 x 5 cm cavitary lesion with air-fluid level in superior segment of right lower lobe. Rest of the lung fields show no focal infiltrates. There is no significant pleural effusion or pneumothorax. Upper Abdomen: No acute findings are seen. Musculoskeletal: Visualized bony structures are unremarkable. Augmentation/reconstruction prostheses are seen in both breasts. Review of the MIP images confirms the above findings. IMPRESSION: There is new large alveolar infiltrate in right lower lobe suggesting pneumonia. There is a large cavitary lesion with air-fluid level in the superior segment of right lower lobe suggesting possible lung abscess. There is no evidence of pulmonary artery embolism. There is ectasia of main pulmonary artery suggesting pulmonary arterial hypertension. There is no evidence of thoracic aortic dissection. Coronary artery calcifications are seen. There is 4 cm ascending thoracic aneurysm with no significant change. Recommend annual imaging followup by CTA or MRA. This recommendation follows 2010 ACCF/AHA/AATS/ACR/ASA/SCA/SCAI/SIR/STS/SVM Guidelines for the Diagnosis and Management of Patients with Thoracic Aortic Disease. Circulation. 2010; 121: : L572-I203Aortic aneurysm NOS (  ICD10-I71.9) Electronically Signed   By: Elmer Picker M.D.   On: 01/23/2022 13:02   DG Chest 2 View  Result Date: 01/23/2022 CLINICAL DATA:  Shortness of breath.  Recent diagnosis of pneumonia. EXAM: CHEST - 2 VIEW COMPARISON:  CT 12/12/2021 FINDINGS: Normal heart size. No pleural effusion identified. There is diffuse airspace disease throughout the right lower lobe, most severe within the superior segment. Left lung appears clear. Visualized osseous structures are unremarkable. IMPRESSION: 1. New right lower lobe airspace  disease compatible with pneumonia. Followup PA and lateral chest X-ray is recommended in 3-4 weeks following trial of antibiotic therapy to ensure resolution and exclude underlying malignancy. Electronically Signed   By: Kerby Moors M.D.   On: 01/23/2022 08:50     TODAY-DAY OF DISCHARGE:  Subjective:   [name] today has no headache,no chest abdominal pain,no new weakness tingling or numbness, feels much better wants to go home today.   Objective:   Blood pressure 117/75, pulse (!) 122, temperature 97.8 F (36.6 C), temperature source Oral, resp. rate 18, height _0  (1.676 m), weight 72.1 kg, SpO2 94 %. No intake or output data in the 24 hours ending 02/17/22 0922 Filed Weights   02/13/22 1316  Weight: 72.1 kg    Exam: Awake Alert, Oriented *3, No new F.N deficits, Normal affect [place].AT,PERRAL Supple Neck,No JVD, No cervical lymphadenopathy appriciated.  Symmetrical Chest wall movement, Good air movement bilaterally, CTAB RRR,No Gallops,Rubs or new Murmurs, No Parasternal Heave +ve B.Sounds, Abd Soft, Non tender, No organomegaly appriciated, No rebound -guarding or rigidity. No Cyanosis, Clubbing or edema, No new Rash or bruise   PERTINENT RADIOLOGIC STUDIES: No results found.   PERTINENT LAB RESULTS: CBC: Recent Labs    02/15/22 0642  WBC 15.3*  HGB 15.0  HCT 45.5  PLT 351   CMET CMP     Component Value Date/Time   NA 137 02/14/2022 0751   K 3.8 02/14/2022 0751   CL 101 02/14/2022 0751   CO2 22 02/14/2022 0751   GLUCOSE 181 (H) 02/14/2022 0751   BUN 18 02/14/2022 0751   CREATININE 0.95 02/14/2022 0751   CREATININE 0.80 07/28/2019 1014   CALCIUM 9.2 02/14/2022 0751   PROT 5.9 (L) 06/12/2020 0129   ALBUMIN 3.1 (L) 06/12/2020 0129   AST 31 06/12/2020 0129   AST 15 07/28/2019 1014   ALT 40 06/12/2020 0129   ALT 12 07/28/2019 1014   ALKPHOS 56 06/12/2020 0129   BILITOT 1.0 06/12/2020 0129   BILITOT 0.6 07/28/2019 1014   GFRNONAA >60 02/14/2022 0751    GFRNONAA >60 07/28/2019 1014   GFRAA >60 11/22/2019 0603   GFRAA >60 07/28/2019 1014    GFR Estimated Creatinine Clearance: 49.6 mL/min (by C-G formula based on SCr of 0.95 mg/dL). No results for input(s): "LIPASE", "AMYLASE" in the last 72 hours. No results for input(s): "CKTOTAL", "CKMB", "CKMBINDEX", "TROPONINI" in the last 72 hours. Invalid input(s): "POCBNP" No results for input(s): "DDIMER" in the last 72 hours. No results for input(s): "HGBA1C" in the last 72 hours. No results for input(s): "CHOL", "HDL", "LDLCALC", "TRIG", "CHOLHDL", "LDLDIRECT" in the last 72 hours. No results for input(s): "TSH", "T4TOTAL", "T3FREE", "THYROIDAB" in the last 72 hours.  Invalid input(s): "FREET3" No results for input(s): "VITAMINB12", "FOLATE", "FERRITIN", "TIBC", "IRON", "RETICCTPCT" in the last 72 hours. Coags: No results for input(s): "INR" in the last 72 hours.  Invalid input(s): "PT" Microbiology: No results found for this or any previous visit (from the past 240  hour(s)).  FURTHER DISCHARGE INSTRUCTIONS:  Get Medicines reviewed and adjusted: Please take all your medications with you for your next visit with your Primary MD  Laboratory/radiological data: Please request your Primary MD to go over all hospital tests and procedure/radiological results at the follow up, please ask your Primary MD to get all Hospital records sent to his/her office.  In some cases, they will be blood work, cultures and biopsy results pending at the time of your discharge. Please request that your primary care M.D. goes through all the records of your hospital data and follows up on these results.  Also Note the following: If you experience worsening of your admission symptoms, develop shortness of breath, life threatening emergency, suicidal or homicidal thoughts you must seek medical attention immediately by calling 911 or calling your MD immediately  if symptoms less severe.  You must read complete  instructions/literature along with all the possible adverse reactions/side effects for all the Medicines you take and that have been prescribed to you. Take any new Medicines after you have completely understood and accpet all the possible adverse reactions/side effects.   Do not drive when taking Pain medications or sleeping medications (Benzodaizepines)  Do not take more than prescribed Pain, Sleep and Anxiety Medications. It is not advisable to combine anxiety,sleep and pain medications without talking with your primary care practitioner  Special Instructions: If you have smoked or chewed Tobacco  in the last 2 yrs please stop smoking, stop any regular Alcohol  and or any Recreational drug use.  Wear Seat belts while driving.  Please note: You were cared for by a hospitalist during your hospital stay. Once you are discharged, your primary care physician will handle any further medical issues. Please note that NO REFILLS for any discharge medications will be authorized once you are discharged, as it is imperative that you return to your primary care physician (or establish a relationship with a primary care physician if you do not have one) for your post hospital discharge needs so that they can reassess your need for medications and monitor your lab values.  Total Time spent coordinating discharge including counseling, education and face to face time equals greater than 30 minutes.  Signed: Estoria Cook 02/17/2022 9:22 AM

## 2022-02-17 NOTE — Progress Notes (Signed)
   Palliative Medicine Inpatient Follow Up Note  Palliative care has acknowledged the consult for [facility] [name]. A discharge order has already been placed. Per secure chat with Dr. Marylee Floras no longer needs an inpatient consulted.   The consult will be discontinued at this time though please consult [place] again if any additional needs arise.  NO Charge ______________________________________________________________________________________ [place] Palliative Medicine Team Team Cell Phone: [phone] Please utilize secure chat with additional questions, if there is no response within 30 minutes please call the above phone number  Palliative Medicine Team providers are available by phone from 7am to 7pm daily and can be reached through the team cell phone.  Should this patient require assistance outside of these hours, please call the patient's attending physician.

## 2022-02-18 DIAGNOSIS — J441 Chronic obstructive pulmonary disease with (acute) exacerbation: Secondary | ICD-10-CM | POA: Diagnosis not present

## 2022-02-18 DIAGNOSIS — I2699 Other pulmonary embolism without acute cor pulmonale: Secondary | ICD-10-CM | POA: Diagnosis not present

## 2022-02-18 DIAGNOSIS — I10 Essential (primary) hypertension: Secondary | ICD-10-CM | POA: Diagnosis not present

## 2022-02-18 DIAGNOSIS — F419 Anxiety disorder, unspecified: Secondary | ICD-10-CM | POA: Diagnosis not present

## 2022-02-18 DIAGNOSIS — Z792 Long term (current) use of antibiotics: Secondary | ICD-10-CM | POA: Diagnosis not present

## 2022-02-18 DIAGNOSIS — Z9981 Dependence on supplemental oxygen: Secondary | ICD-10-CM | POA: Diagnosis not present

## 2022-02-18 DIAGNOSIS — Z7901 Long term (current) use of anticoagulants: Secondary | ICD-10-CM | POA: Diagnosis not present

## 2022-02-18 DIAGNOSIS — J9621 Acute and chronic respiratory failure with hypoxia: Secondary | ICD-10-CM | POA: Diagnosis not present

## 2022-02-18 DIAGNOSIS — J852 Abscess of lung without pneumonia: Secondary | ICD-10-CM | POA: Diagnosis not present

## 2022-02-18 DIAGNOSIS — M549 Dorsalgia, unspecified: Secondary | ICD-10-CM | POA: Diagnosis not present

## 2022-02-20 DIAGNOSIS — J449 Chronic obstructive pulmonary disease, unspecified: Secondary | ICD-10-CM | POA: Diagnosis not present

## 2022-02-22 ENCOUNTER — Telehealth: Payer: Self-pay | Admitting: Adult Health

## 2022-02-23 LAB — FUNGUS CULTURE RESULT

## 2022-02-23 LAB — FUNGUS CULTURE WITH STAIN

## 2022-02-23 LAB — FUNGAL ORGANISM REFLEX

## 2022-02-23 NOTE — Telephone Encounter (Signed)
Pt states her oxygen is staying up pretty good. Patient called back to check on status. Pt states she needs big tanks to be picked up and given a POC

## 2022-02-24 ENCOUNTER — Ambulatory Visit: Payer: PPO | Admitting: Internal Medicine

## 2022-02-24 NOTE — Telephone Encounter (Signed)
Spoke with the pt  She states that she had to go to hospital after last visit and they advised her to turn o2 up to 5lpm  She has been using this and sats are around 95-96%  I advised she continue with this until we see her next wk  She has appt on 10.3.23 She will keep this appt  Nothing further needed

## 2022-02-27 DIAGNOSIS — I2699 Other pulmonary embolism without acute cor pulmonale: Secondary | ICD-10-CM | POA: Diagnosis not present

## 2022-02-27 DIAGNOSIS — M549 Dorsalgia, unspecified: Secondary | ICD-10-CM | POA: Diagnosis not present

## 2022-02-27 DIAGNOSIS — Z9981 Dependence on supplemental oxygen: Secondary | ICD-10-CM | POA: Diagnosis not present

## 2022-02-27 DIAGNOSIS — J441 Chronic obstructive pulmonary disease with (acute) exacerbation: Secondary | ICD-10-CM | POA: Diagnosis not present

## 2022-02-27 DIAGNOSIS — J852 Abscess of lung without pneumonia: Secondary | ICD-10-CM | POA: Diagnosis not present

## 2022-02-27 DIAGNOSIS — Z792 Long term (current) use of antibiotics: Secondary | ICD-10-CM | POA: Diagnosis not present

## 2022-02-27 DIAGNOSIS — Z7901 Long term (current) use of anticoagulants: Secondary | ICD-10-CM | POA: Diagnosis not present

## 2022-02-27 DIAGNOSIS — I10 Essential (primary) hypertension: Secondary | ICD-10-CM | POA: Diagnosis not present

## 2022-02-27 DIAGNOSIS — F419 Anxiety disorder, unspecified: Secondary | ICD-10-CM | POA: Diagnosis not present

## 2022-02-27 DIAGNOSIS — J9621 Acute and chronic respiratory failure with hypoxia: Secondary | ICD-10-CM | POA: Diagnosis not present

## 2022-02-28 ENCOUNTER — Ambulatory Visit (INDEPENDENT_AMBULATORY_CARE_PROVIDER_SITE_OTHER): Payer: PPO

## 2022-02-28 ENCOUNTER — Ambulatory Visit: Payer: PPO | Admitting: Pulmonary Disease

## 2022-02-28 ENCOUNTER — Encounter: Payer: Self-pay | Admitting: Pulmonary Disease

## 2022-02-28 VITALS — BP 138/82 | HR 81 | Ht 66.0 in | Wt 159.0 lb

## 2022-02-28 DIAGNOSIS — R042 Hemoptysis: Secondary | ICD-10-CM

## 2022-02-28 DIAGNOSIS — J851 Abscess of lung with pneumonia: Secondary | ICD-10-CM

## 2022-02-28 DIAGNOSIS — J441 Chronic obstructive pulmonary disease with (acute) exacerbation: Secondary | ICD-10-CM

## 2022-02-28 DIAGNOSIS — J9611 Chronic respiratory failure with hypoxia: Secondary | ICD-10-CM

## 2022-02-28 DIAGNOSIS — I2609 Other pulmonary embolism with acute cor pulmonale: Secondary | ICD-10-CM | POA: Diagnosis not present

## 2022-02-28 DIAGNOSIS — R918 Other nonspecific abnormal finding of lung field: Secondary | ICD-10-CM | POA: Diagnosis not present

## 2022-02-28 MED ORDER — ALBUTEROL SULFATE (2.5 MG/3ML) 0.083% IN NEBU: 2.5000 mg | INHALATION_SOLUTION | Freq: Four times a day (QID) | RESPIRATORY_TRACT | 12 refills | Status: DC | PRN

## 2022-02-28 NOTE — Patient Instructions (Signed)
Plan: COPD with acute exacerbation:  On Saturday, October 7 take 10 mg daily x4 days, then take 10 mg every other day to complete the prescription.  Once you have stopped taking prednisone you do not need to resume it Use Breztri twice a day Use albuterol nebulized or HFA as needed for chest tightness wheezing or shortness of breath   Chronic respiratory failure with hypoxemia: We will prescribe a portable oxygen concentrator for you Use 2L O2 at rest, 3 liters with exertion   Lung abscess:  Continue Augmentin to complete the prescription as prescribed We will get a chest x-ray today   Acute bilateral pulmonary embolism with DVT Continue Eliquis for life Use compression stockings for left leg swelling   We will see you back in 4 weeks or sooner if needed

## 2022-02-28 NOTE — Progress Notes (Signed)
Synopsis: Followed by Dr. Annamaria Cook for many years for COPD and chronic respiratory failure with hypoxemia, in 2023 had a series of COPD exacerbations and by August 2023 had a right lower lobe lung abscess noted on CT scanning.  Re-admitted 01/2022 with pulmonary embolism.  Subjective:   PATIENT ID: [name] GENDER: female DOB: [birth year], MRN: [id number]   HPI  Chief Complaint  Patient presents with   Hospitalization Follow-up    HFU for COPD and PE. Uses 4L of O2 24/7. States she has been well since being home.    [name] has been doing quite well since her most recent hospitalization.  Her energy level has improved, her shortness of breath has improved.  She is no longer coughing up blood.  She would like to switch back to a portable oxygen concentrator.  She says that she has been using 5 L of oxygen at home.  She notes some swelling in her left ankle.  She is taking Eliquis.  She is still taking prednisone and Augmentin.  Past Medical History:  Diagnosis Date   Allergic rhinitis    Anemia due to GI blood loss    Anxiety    Asthma    Chronic airway obstruction, not elsewhere classified    Depressive disorder, not elsewhere classified    DVT (deep venous thrombosis) (HCC)    LLE DVT 01/13/19   Dyspnea    Esophageal reflux    Essential hypertension 08/27/2019   PE (pulmonary thromboembolism) ([place]) 01/29/2019   Pneumonia    Wears partial dentures     ROS Gen: per HPI HEENT: Denies blurred vision, double vision, hearing loss, tinnitus, sinus congestion, rhinorrhea, sore throat, neck stiffness, dysphagia PULM: per HPI CV: Denies chest pain, edema, orthopnea, paroxysmal nocturnal dyspnea, palpitations     Objective:  Physical Exam   Vitals:   02/28/22 1042  BP: 138/82  Pulse: 81  SpO2: 96%  Weight: 159 lb (72.1 kg)  Height: '5\' 6"'$  (1.676 m)   4L [place]  On repeat testing with me O2 saturation is 96% on 2L [place]  Gen: well appearing HENT: OP clear, TM's clear, neck  supple PULM: CTA B, normal percussion CV: RRR, no mgr, trace edema GI: BS+, soft, nontender Derm: no cyanosis or rash Psyche: normal mood and affect    CBC    Component Value Date/Time   WBC 15.3 (H) 02/15/2022 0642   RBC 4.93 02/15/2022 0642   HGB 15.0 02/15/2022 0642   HCT 45.5 02/15/2022 0642   PLT 351 02/15/2022 0642   MCV 92.3 02/15/2022 0642   MCH 30.4 02/15/2022 0642   MCHC 33.0 02/15/2022 0642   RDW 14.3 02/15/2022 0642   LYMPHSABS 0.9 02/13/2022 1429   MONOABS 1.1 (H) 02/13/2022 1429   EOSABS 0.0 02/13/2022 1429   BASOSABS 0.0 02/13/2022 1429     Chest imaging: February 02, 2022 chest x-ray images independently reviewed showing a stable right lower lobe air-fluid level in a likely abscess.  Radiology mentions a second 2.3 cm nodule in the right lower lobe, less conspicuous Sept 18th 2023 CT angiogram chest images independently reviewed showing decreased size of infected right lower lobe bulla, near complete resolution of pneumonia, positive for pulmonary embolism with segmental clots bilaterally, RV LV ratio 1.7  PFT: 2015 spiro FEV1/FVC 0.51 FVC 2.13/ 65%, FEV1 1.08/ 44% 2018 spiro ratio 0.43 FEV1 1.04/44%,   Labs: 01/25/2022 CBC WBC 14.9, Hgb 14.3 gm/dL  Path:  Echo:  Heart Catheterization:  Assessment & Plan:   Abscess of lower lobe of right lung with pneumonia ([place])  COPD with acute exacerbation ([place])  Chronic respiratory failure with hypoxia (HCC)  Hemoptysis  Other acute pulmonary embolism with acute cor pulmonale ([place])  Discussion: [name] looks much better than the last time I saw her.  Unfortunately she was diagnosed with recurrent pulmonary embolism.  Her symptoms have improved significantly with ongoing treatment with Augmentin.  Plan: COPD with acute exacerbation:  On Saturday, October 7 take 10 mg daily x4 days, then take 10 mg every other day to complete the prescription.  Once you have stopped taking prednisone you do not need  to resume it Use Breztri twice a day Use albuterol nebulized or HFA as needed for chest tightness wheezing or shortness of breath  Chronic respiratory failure with hypoxemia: We will prescribe a portable oxygen concentrator for you Use 2L O2 at rest, 3 liters with exertion  Lung abscess:  Continue Augmentin to complete the prescription as prescribed We will get a chest x-ray today  Acute bilateral pulmonary embolism with DVT Continue Eliquis for life Use compression stockings for left leg swelling  We will see you back in 4 weeks or sooner if needed   Immunizations: Immunization History  Administered Date(s) Administered   Fluad Quad(high Dose 65+) 01/10/2021   Influenza Split 02/27/2011, 02/27/2012, 02/26/2013, 02/26/2014, 02/27/2016, 03/25/2017   Influenza Whole 02/07/2009   Influenza, High Dose Seasonal PF 03/17/2014, 02/27/2017, 03/27/2018   Influenza, Quadrivalent, Recombinant, Inj, Pf 02/25/2018, 04/08/2019, 03/05/2020, 02/11/2021   Influenza-Unspecified 01/27/2013, 03/05/2015, 03/05/2020   PFIZER(Purple Top)SARS-COV-2 Vaccination 07/12/2019, 10/06/2019   Tdap 10/07/2013     Current Outpatient Medications:    acetaminophen (TYLENOL) 500 MG tablet, Take 500 mg by mouth every 6 (six) hours as needed for headache (pain)., Disp: , Rfl:    albuterol (PROVENTIL) (2.5 MG/3ML) 0.083% nebulizer solution, Take 3 mLs (2.5 mg total) by nebulization every 6 (six) hours as needed for wheezing or shortness of breath., Disp: 75 mL, Rfl: 12   albuterol (VENTOLIN HFA) 108 (90 Base) MCG/ACT inhaler, Inhale 1-2 puffs into the lungs every 6 (six) hours as needed for wheezing or shortness of breath., Disp: , Rfl:    amoxicillin-clavulanate (AUGMENTIN) 875-125 MG tablet, Take 1 tablet by mouth 2 (two) times daily for 21 days., Disp: 42 tablet, Rfl: 0   APIXABAN (ELIQUIS) VTE STARTER PACK ('10MG'$  AND '5MG'$ ), Take as directed on package: start with two-'5mg'$  tablets twice daily for 7 days. On day 8,  switch to one-'5mg'$  tablet twice daily., Disp: 74 each, Rfl: 0   Budeson-Glycopyrrol-Formoterol (BREZTRI AEROSPHERE) 160-9-4.8 MCG/ACT AERO, Inhale 2 puffs into the lungs in the morning and at bedtime., Disp: 10.7 g, Rfl: 2   escitalopram (LEXAPRO) 10 MG tablet, Take 1 tablet (10 mg total) by mouth daily., Disp: 30 tablet, Rfl: 2   LORazepam (ATIVAN) 1 MG tablet, Take 1 mg by mouth at bedtime., Disp: , Rfl:    omeprazole (PRILOSEC) 20 MG capsule, Take 20 mg by mouth daily., Disp: , Rfl:    predniSONE (DELTASONE) 10 MG tablet, Take 3 tablets ('30mg'$ ) by mouth daily for 1 week, then 2 tablets ('20mg'$ ) daily for 1 week, then take 1 tablet ('10mg'$ ) daily for 1 week and stop., Disp: 45 tablet, Rfl: 0   traZODone (DESYREL) 100 MG tablet, Take 100 mg by mouth at bedtime., Disp: , Rfl:    triamterene-hydrochlorothiazide (MAXZIDE-25) 37.5-25 MG tablet, Take 0.5 tablets by mouth daily., Disp: , Rfl:

## 2022-02-28 NOTE — Addendum Note (Signed)
Addended by: Valerie Salts on: 02/28/2022 02:30 PM   Modules accepted: Orders

## 2022-03-03 ENCOUNTER — Telehealth: Payer: Self-pay | Admitting: Pulmonary Disease

## 2022-03-03 NOTE — Telephone Encounter (Signed)
I called the patient and spoke with her and let her know that we have called and will let [employer] representative be on the lookout for the order so she can get it quickly. Nothing further needed.

## 2022-03-07 ENCOUNTER — Telehealth: Payer: Self-pay | Admitting: Pulmonary Disease

## 2022-03-08 ENCOUNTER — Other Ambulatory Visit (HOSPITAL_COMMUNITY): Payer: Self-pay

## 2022-03-08 NOTE — Telephone Encounter (Signed)
Called [employer] and verified fax number. And asked them when they got the order processed will they call patient with update on pricing and what not in regards to insurance. Office note faxed. Nothing further needed

## 2022-03-08 NOTE — Telephone Encounter (Signed)
Patient calling back and wanting to know what to do. She really needs POC.

## 2022-03-09 ENCOUNTER — Telehealth: Payer: Self-pay | Admitting: Pulmonary Disease

## 2022-03-09 LAB — ACID FAST CULTURE WITH REFLEXED SENSITIVITIES (MYCOBACTERIA): Acid Fast Culture: NEGATIVE

## 2022-03-09 NOTE — Telephone Encounter (Signed)
Attempted to call pt to let her know message received from [employer] but unable to reach. Left message for her to return call so we can let her know that [employer] states she will have to stay with [employer].  Routing to PCCs so they can get the new order sent to [place].

## 2022-03-10 NOTE — Telephone Encounter (Signed)
Called and spoke with pt letting her know the message from [place] and she verbalized understanding.nothing further needed.

## 2022-03-10 NOTE — Telephone Encounter (Signed)
Patient is returning phone call. Patient phone number is [phone] h and [phone] c.

## 2022-03-22 DIAGNOSIS — I2699 Other pulmonary embolism without acute cor pulmonale: Secondary | ICD-10-CM | POA: Diagnosis not present

## 2022-03-22 DIAGNOSIS — Z9981 Dependence on supplemental oxygen: Secondary | ICD-10-CM | POA: Diagnosis not present

## 2022-03-22 DIAGNOSIS — J9621 Acute and chronic respiratory failure with hypoxia: Secondary | ICD-10-CM | POA: Diagnosis not present

## 2022-03-22 DIAGNOSIS — J449 Chronic obstructive pulmonary disease, unspecified: Secondary | ICD-10-CM | POA: Diagnosis not present

## 2022-03-22 DIAGNOSIS — J441 Chronic obstructive pulmonary disease with (acute) exacerbation: Secondary | ICD-10-CM | POA: Diagnosis not present

## 2022-03-28 NOTE — Progress Notes (Unsigned)
Synopsis: Followed by [name] for many years for COPD and chronic respiratory failure with hypoxemia, in 2023 had a series of COPD exacerbations and by August 2023 had a right lower lobe lung abscess noted on CT scanning.  Re-admitted 01/2022 with pulmonary embolism.  Subjective:   PATIENT ID: [name] GENDER: female DOB: 17-Jul-1940, MRN: [id number]   HPI  No chief complaint on file. She says that she isn't using her oxygen much at home. She has a concentrator now which she is using with heavy exetion.  She keeps watching her oxygen saturation and it is typically 92% or above even when walking.  She feels OK.   She has a little yellow cough from time time.  She hasn't taken any antibiotics.   Past Medical History:  Diagnosis Date   Allergic rhinitis    Anemia due to GI blood loss    Anxiety    Asthma    Chronic airway obstruction, not elsewhere classified    Depressive disorder, not elsewhere classified    DVT (deep venous thrombosis) (HCC)    LLE DVT 01/13/19   Dyspnea    Esophageal reflux    Essential hypertension 08/27/2019   PE (pulmonary thromboembolism) ([place]) 01/29/2019   Pneumonia    Wears partial dentures     Review of Systems  Constitutional:  Negative for chills, fever, malaise/fatigue and weight loss.  HENT:  Negative for congestion and sinus pain.   Respiratory:  Positive for shortness of breath. Negative for cough, hemoptysis, sputum production, wheezing and stridor.   Cardiovascular:  Negative for palpitations, orthopnea, claudication and leg swelling.       Objective:  Physical Exam   Vitals:   03/29/22 0950  BP: 132/88  Pulse: 74  SpO2: 93%  Weight: 170 lb (77.1 kg)  Height: '5\' 6"'$  (1.676 m)   RA  Gen: well appearing HENT: OP clear, TM's clear, neck supple PULM: CTA B, normal percussion CV: RRR, no mgr, trace edema GI: BS+, soft, nontender Derm: no cyanosis or rash Psyche: normal mood and affect   CBC    Component Value  Date/Time   WBC 15.3 (H) 02/15/2022 0642   RBC 4.93 02/15/2022 0642   HGB 15.0 02/15/2022 0642   HCT 45.5 02/15/2022 0642   PLT 351 02/15/2022 0642   MCV 92.3 02/15/2022 0642   MCH 30.4 02/15/2022 0642   MCHC 33.0 02/15/2022 0642   RDW 14.3 02/15/2022 0642   LYMPHSABS 0.9 02/13/2022 1429   MONOABS 1.1 (H) 02/13/2022 1429   EOSABS 0.0 02/13/2022 1429   BASOSABS 0.0 02/13/2022 1429     Chest imaging: February 02, 2022 chest x-ray images independently reviewed showing a stable right lower lobe air-fluid level in a likely abscess.  Radiology mentions a second 2.3 cm nodule in the right lower lobe, less conspicuous Sept 18th 2023 CT angiogram chest images independently reviewed showing decreased size of infected right lower lobe bulla, near complete resolution of pneumonia, positive for pulmonary embolism with segmental clots bilaterally, RV LV ratio 1.7  PFT: 2015 spiro FEV1/FVC 0.51 FVC 2.13/ 65%, FEV1 1.08/ 44% 2018 spiro ratio 0.43 FEV1 1.04/44%,   Labs: 01/25/2022 CBC WBC 14.9, Hgb 14.3 gm/dL  Path:  Echo:  Heart Catheterization:      Assessment & Plan:   Cavitary pneumonia - Plan: DG Chest 2 View  Chronic respiratory failure with hypoxia (HCC)  Abscess of lower lobe of right lung with pneumonia ([place])  COPD mixed type ([place])  Discussion: This  has been a stable interval for [place].  She is making significant improvement from her cavitary pneumonia.  She has done well off of antibiotics.  COPD has been stable.  Plan: Severe COPD: Continue Breztri 2 puffs twice a day no matter how you feel Albuterol as needed for chest tightness wheezing or shortness of breath Practice good hand hygiene Flu shot today  Cavitary pneumonia: Chest x-ray today  Chronic respiratory failure with hypoxemia: Continue to use oxygen when you exert yourself, 2 L I agree with your plan to only use this with heavy exertion as you are doing at home  Chronic DVT, recurrent pulmonary  embolism: Continue Eliquis as you are doing  We will see you back in 3 months or sooner if needed   We will see you back in 4 weeks or sooner if needed   Immunizations: Immunization History  Administered Date(s) Administered   Fluad Quad(high Dose 65+) 01/10/2021   Influenza Split 02/27/2011, 02/27/2012, 02/26/2013, 02/26/2014, 02/27/2016, 03/25/2017   Influenza Whole 02/07/2009   Influenza, High Dose Seasonal PF 03/17/2014, 02/27/2017, 03/27/2018   Influenza, Quadrivalent, Recombinant, Inj, Pf 02/25/2018, 04/08/2019, 03/05/2020, 02/11/2021   Influenza-Unspecified 01/27/2013, 03/05/2015, 03/05/2020   PFIZER(Purple Top)SARS-COV-2 Vaccination 07/12/2019, 10/06/2019   Tdap 10/07/2013     Current Outpatient Medications:    acetaminophen (TYLENOL) 500 MG tablet, Take 500 mg by mouth every 6 (six) hours as needed for headache (pain)., Disp: , Rfl:    albuterol (PROVENTIL) (2.5 MG/3ML) 0.083% nebulizer solution, Take 3 mLs (2.5 mg total) by nebulization every 6 (six) hours as needed for wheezing or shortness of breath., Disp: 75 mL, Rfl: 12   albuterol (VENTOLIN HFA) 108 (90 Base) MCG/ACT inhaler, Inhale 1-2 puffs into the lungs every 6 (six) hours as needed for wheezing or shortness of breath., Disp: , Rfl:    APIXABAN (ELIQUIS) VTE STARTER PACK ('10MG'$  AND '5MG'$ ), Take as directed on package: start with two-'5mg'$  tablets twice daily for 7 days. On day 8, switch to one-'5mg'$  tablet twice daily., Disp: 74 each, Rfl: 0   Budeson-Glycopyrrol-Formoterol (BREZTRI AEROSPHERE) 160-9-4.8 MCG/ACT AERO, Inhale 2 puffs into the lungs in the morning and at bedtime., Disp: 10.7 g, Rfl: 2   escitalopram (LEXAPRO) 10 MG tablet, Take 1 tablet (10 mg total) by mouth daily., Disp: 30 tablet, Rfl: 2   LORazepam (ATIVAN) 1 MG tablet, Take 1 mg by mouth at bedtime., Disp: , Rfl:    omeprazole (PRILOSEC) 20 MG capsule, Take 20 mg by mouth daily., Disp: , Rfl:    traZODone (DESYREL) 100 MG tablet, Take 100 mg by mouth at  bedtime., Disp: , Rfl:    triamterene-hydrochlorothiazide (MAXZIDE-25) 37.5-25 MG tablet, Take 0.5 tablets by mouth daily., Disp: , Rfl:

## 2022-03-29 ENCOUNTER — Encounter: Payer: Self-pay | Admitting: Pulmonary Disease

## 2022-03-29 ENCOUNTER — Ambulatory Visit (INDEPENDENT_AMBULATORY_CARE_PROVIDER_SITE_OTHER): Payer: PPO

## 2022-03-29 ENCOUNTER — Ambulatory Visit: Payer: PPO | Admitting: Pulmonary Disease

## 2022-03-29 VITALS — BP 132/88 | HR 74 | Ht 66.0 in | Wt 170.0 lb

## 2022-03-29 DIAGNOSIS — J984 Other disorders of lung: Secondary | ICD-10-CM | POA: Diagnosis not present

## 2022-03-29 DIAGNOSIS — J9611 Chronic respiratory failure with hypoxia: Secondary | ICD-10-CM

## 2022-03-29 DIAGNOSIS — J449 Chronic obstructive pulmonary disease, unspecified: Secondary | ICD-10-CM | POA: Diagnosis not present

## 2022-03-29 DIAGNOSIS — Z23 Encounter for immunization: Secondary | ICD-10-CM | POA: Diagnosis not present

## 2022-03-29 DIAGNOSIS — J851 Abscess of lung with pneumonia: Secondary | ICD-10-CM

## 2022-03-29 DIAGNOSIS — J189 Pneumonia, unspecified organism: Secondary | ICD-10-CM | POA: Diagnosis not present

## 2022-03-29 NOTE — Patient Instructions (Signed)
Severe COPD: Continue Breztri 2 puffs twice a day no matter how you feel Albuterol as needed for chest tightness wheezing or shortness of breath Practice good hand hygiene Flu shot today  Cavitary pneumonia: Chest x-ray today  Chronic respiratory failure with hypoxemia: Continue to use oxygen when you exert yourself, 2 L I agree with your plan to only use this with heavy exertion as you are doing at home  Chronic DVT, recurrent pulmonary embolism: Continue Eliquis as you are doing  We will see you back in 3 months or sooner if needed

## 2022-04-22 DIAGNOSIS — J449 Chronic obstructive pulmonary disease, unspecified: Secondary | ICD-10-CM | POA: Diagnosis not present

## 2022-04-23 ENCOUNTER — Encounter (HOSPITAL_BASED_OUTPATIENT_CLINIC_OR_DEPARTMENT_OTHER): Payer: Self-pay | Admitting: Emergency Medicine

## 2022-04-23 ENCOUNTER — Emergency Department (HOSPITAL_BASED_OUTPATIENT_CLINIC_OR_DEPARTMENT_OTHER)
Admission: EM | Admit: 2022-04-23 | Discharge: 2022-04-23 | Disposition: A | Discharge status: Home / Self Care | Payer: PPO | Attending: Emergency Medicine | Admitting: Emergency Medicine

## 2022-04-23 DIAGNOSIS — S8012XA Contusion of left lower leg, initial encounter: Secondary | ICD-10-CM | POA: Diagnosis not present

## 2022-04-23 DIAGNOSIS — Z7901 Long term (current) use of anticoagulants: Secondary | ICD-10-CM | POA: Insufficient documentation

## 2022-04-23 DIAGNOSIS — W268XXA Contact with other sharp object(s), not elsewhere classified, initial encounter: Secondary | ICD-10-CM | POA: Diagnosis not present

## 2022-04-23 DIAGNOSIS — S80922A Unspecified superficial injury of left lower leg, initial encounter: Secondary | ICD-10-CM | POA: Diagnosis present

## 2022-04-23 DIAGNOSIS — Z23 Encounter for immunization: Secondary | ICD-10-CM | POA: Diagnosis not present

## 2022-04-23 DIAGNOSIS — S81812A Laceration without foreign body, left lower leg, initial encounter: Secondary | ICD-10-CM | POA: Diagnosis not present

## 2022-04-23 MED ORDER — TETANUS-DIPHTH-ACELL PERTUSSIS 5-2.5-18.5 LF-MCG/0.5 IM SUSY
0.5000 mL | PREFILLED_SYRINGE | Freq: Once | INTRAMUSCULAR | Status: AC
  Administered 2022-04-23: 0.5 mL via INTRAMUSCULAR
  Filled 2022-04-23: qty 0.5

## 2022-04-23 NOTE — ED Provider Notes (Signed)
[facility] EMERGENCY DEPT Provider Note   CSN: [id number] Arrival date & time: 04/23/22  2048     History  Chief Complaint  Patient presents with   Laceration    [name] is a 81 y.o. female.  She is here for an evaluation of injury to her left lower leg.  She said she was getting off a stool and banged it and had a laceration to her leg earlier today.  She cannot get it to stop bleeding.  She is on blood thinners.  She is not sure when her last tetanus shot was.  No foreign body sensation.  The history is provided by the patient.  Laceration Location:  Leg Leg laceration location:  L lower leg Length:  6 Depth:  Cutaneous Quality: jagged   Bleeding: venous   Laceration mechanism:  Blunt object Foreign body present:  No foreign bodies Relieved by:  None tried Worsened by:  Movement and pressure Ineffective treatments:  None tried Tetanus status:  Out of date      Home Medications Prior to Admission medications   Medication Sig Start Date End Date Taking? Authorizing Provider  acetaminophen (TYLENOL) 500 MG tablet Take 500 mg by mouth every 6 (six) hours as needed for headache (pain).    [provider]  albuterol (PROVENTIL) (2.5 MG/3ML) 0.083% nebulizer solution Take 3 mLs (2.5 mg total) by nebulization every 6 (six) hours as needed for wheezing or shortness of breath. 02/28/22   Juanito Doom, MD  albuterol (VENTOLIN HFA) 108 (90 Base) MCG/ACT inhaler Inhale 1-2 puffs into the lungs every 6 (six) hours as needed for wheezing or shortness of breath.    [provider]  APIXABAN Arne [place]) VTE STARTER PACK ('10MG'$  AND '5MG'$ ) Take as directed on package: start with two-'5mg'$  tablets twice daily for 7 days. On day 8, switch to one-'5mg'$  tablet twice daily. 02/17/22   Ghimire, Henreitta Leber, MD  Budeson-Glycopyrrol-Formoterol (BREZTRI AEROSPHERE) 160-9-4.8 MCG/ACT AERO Inhale 2 puffs into the lungs in the morning and at bedtime. 02/17/22   Ghimire,  Henreitta Leber, MD  escitalopram (LEXAPRO) 10 MG tablet Take 1 tablet (10 mg total) by mouth daily. 02/17/22 02/17/23  Ghimire, Henreitta Leber, MD  LORazepam (ATIVAN) 1 MG tablet Take 1 mg by mouth at bedtime.    [provider]  omeprazole (PRILOSEC) 20 MG capsule Take 20 mg by mouth daily. 03/26/20   [provider]  traZODone (DESYREL) 100 MG tablet Take 100 mg by mouth at bedtime.    [provider]  triamterene-hydrochlorothiazide (MAXZIDE-25) 37.5-25 MG tablet Take 0.5 tablets by mouth daily. 02/06/19   [provider]      Allergies    Pneumococcal vaccines, Clarithromycin, Estradiol, Sulfa antibiotics, Doxycycline hyclate, Pneumococcal polysaccharide vaccine, Sulfacetamide sodium-sulfur, and Sulfonamide derivatives    Review of Systems   Review of Systems  Physical Exam Updated Vital Signs BP (!) 124/91   Pulse (!) 111   Temp 98.1 F (36.7 C) (Oral)   Resp 18   SpO2 91%  Physical Exam Constitutional:      Appearance: She is well-developed.  HENT:     Head: Normocephalic and atraumatic.  Eyes:     Conjunctiva/sclera: Conjunctivae normal.  Musculoskeletal:        General: Tenderness and signs of injury present. Normal range of motion.     Cervical back: Neck supple.     Comments: She has some bruising and a curvilinear 6 cm skin tear of her left lower  leg.  It is associated with some venous oozing.  Distal neurovascular intact.  No foreign body appreciated  Skin:    General: Skin is warm and dry.  Neurological:     General: No focal deficit present.     Mental Status: She is alert.     GCS: GCS eye subscore is 4. GCS verbal subscore is 5. GCS motor subscore is 6.     ED Results / Procedures / Treatments   Labs (all labs ordered are listed, but only abnormal results are displayed) Labs Reviewed - No data to display  EKG None  Radiology No results found.  Procedures .Marland KitchenLaceration Repair  Date/Time: 04/23/2022 9:42 PM  Performed by:  Hayden Rasmussen, MD Authorized by: Hayden Rasmussen, MD   Consent:    Consent obtained:  Verbal   Consent given by:  Patient   Risks, benefits, and alternatives were discussed: yes     Risks discussed:  Infection, nerve damage, poor wound healing, pain, retained foreign body, tendon damage and vascular damage   Alternatives discussed:  No treatment, delayed treatment and referral Universal protocol:    Procedure explained and questions answered to patient or proxy's satisfaction: yes     Patient identity confirmed:  Verbally with patient Anesthesia:    Anesthesia method:  None Laceration details:    Location:  Leg   Leg location:  L lower leg   Length (cm):  6 Treatment:    Area cleansed with:  Saline   Amount of cleaning:  Standard   Irrigation solution:  Sterile saline Skin repair:    Repair method:  Steri-Strips   Number of Steri-Strips:  6 Approximation:    Approximation:  Loose Repair type:    Repair type:  Simple Post-procedure details:    Dressing:  Non-adherent dressing and bulky dressing   Procedure completion:  Tolerated well, no immediate complications     Medications Ordered in ED Medications - No data to display  ED Course/ Medical Decision Making/ A&P                           Medical Decision Making Risk Prescription drug management.   81 year old female here for a laceration to her left lower leg/skin tear after minor trauma.  Unable to suture wound closed due to friability of skin.  Placed Steri-Strips to reinforce area.  She is still oozing likely secondary to her anticoagulation.  Nonstick and bulky dressing applied with compression wrap.  Patient tolerated procedure well.  We discussed leaving the dressing on for a few days to allow it to coagulate better.  Return instructions discussed.        Final Clinical Impression(s) / ED Diagnoses Final diagnoses:  Contusion of left lower extremity, initial encounter  Laceration of left lower  extremity, initial encounter    Rx / DC Orders ED Discharge Orders     None         Hayden Rasmussen, MD 04/24/22 [phone]

## 2022-04-23 NOTE — ED Triage Notes (Signed)
Pt here from home with a lac to her left lower leg , cut on a bar stool , is on a blood thinners

## 2022-04-23 NOTE — Discharge Instructions (Signed)
You were seen in the emergency department for evaluation of injury to your left lower leg.  Your wound was cleaned and Steri-Strips were placed to help stabilize it.  Bulky dressing was applied.  Please keep area elevated.  Tylenol for pain.  You can change the dressing Tuesday.  Soap and water.  Follow-up with your regular doctor.  Return if any worsening or concerning symptoms.

## 2022-04-23 NOTE — ED Notes (Signed)
Skin tear on right lower leg dressed with nonstick tefla, pressure gauze wrap and coban and pt instructed on home care

## 2022-04-23 NOTE — ED Triage Notes (Signed)
Tdap is not up to date

## 2022-05-02 DIAGNOSIS — Z0000 Encounter for general adult medical examination without abnormal findings: Secondary | ICD-10-CM | POA: Diagnosis not present

## 2022-05-02 DIAGNOSIS — R5383 Other fatigue: Secondary | ICD-10-CM | POA: Diagnosis not present

## 2022-05-02 DIAGNOSIS — Z7901 Long term (current) use of anticoagulants: Secondary | ICD-10-CM | POA: Diagnosis not present

## 2022-05-02 DIAGNOSIS — N390 Urinary tract infection, site not specified: Secondary | ICD-10-CM | POA: Diagnosis not present

## 2022-05-02 DIAGNOSIS — S8010XA Contusion of unspecified lower leg, initial encounter: Secondary | ICD-10-CM | POA: Diagnosis not present

## 2022-05-02 DIAGNOSIS — E559 Vitamin D deficiency, unspecified: Secondary | ICD-10-CM | POA: Diagnosis not present

## 2022-05-02 DIAGNOSIS — E7800 Pure hypercholesterolemia, unspecified: Secondary | ICD-10-CM | POA: Diagnosis not present

## 2022-05-02 DIAGNOSIS — D509 Iron deficiency anemia, unspecified: Secondary | ICD-10-CM | POA: Diagnosis not present

## 2022-05-02 DIAGNOSIS — T148XXA Other injury of unspecified body region, initial encounter: Secondary | ICD-10-CM | POA: Diagnosis not present

## 2022-05-22 DIAGNOSIS — J449 Chronic obstructive pulmonary disease, unspecified: Secondary | ICD-10-CM | POA: Diagnosis not present

## 2022-05-24 DIAGNOSIS — B3731 Acute candidiasis of vulva and vagina: Secondary | ICD-10-CM | POA: Diagnosis not present

## 2022-05-24 DIAGNOSIS — R601 Generalized edema: Secondary | ICD-10-CM | POA: Diagnosis not present

## 2022-05-24 DIAGNOSIS — F419 Anxiety disorder, unspecified: Secondary | ICD-10-CM | POA: Diagnosis not present

## 2022-05-28 ENCOUNTER — Emergency Department (HOSPITAL_COMMUNITY)
Admission: EM | Admit: 2022-05-28 | Discharge: 2022-05-29 | Discharge status: Against Medical Advice | Payer: PPO | Attending: Emergency Medicine | Admitting: Emergency Medicine

## 2022-05-28 ENCOUNTER — Emergency Department (HOSPITAL_COMMUNITY): Payer: PPO

## 2022-05-28 ENCOUNTER — Other Ambulatory Visit: Payer: Self-pay

## 2022-05-28 DIAGNOSIS — M7989 Other specified soft tissue disorders: Secondary | ICD-10-CM | POA: Insufficient documentation

## 2022-05-28 DIAGNOSIS — J439 Emphysema, unspecified: Secondary | ICD-10-CM | POA: Diagnosis not present

## 2022-05-28 DIAGNOSIS — Z20822 Contact with and (suspected) exposure to covid-19: Secondary | ICD-10-CM | POA: Diagnosis not present

## 2022-05-28 DIAGNOSIS — J449 Chronic obstructive pulmonary disease, unspecified: Secondary | ICD-10-CM | POA: Diagnosis not present

## 2022-05-28 DIAGNOSIS — Z7901 Long term (current) use of anticoagulants: Secondary | ICD-10-CM | POA: Diagnosis not present

## 2022-05-28 DIAGNOSIS — R059 Cough, unspecified: Secondary | ICD-10-CM | POA: Diagnosis not present

## 2022-05-28 DIAGNOSIS — R0602 Shortness of breath: Secondary | ICD-10-CM | POA: Diagnosis not present

## 2022-05-28 LAB — CBC WITH DIFFERENTIAL/PLATELET
Abs Immature Granulocytes: 0.04 10*3/uL (ref 0.00–0.07)
Basophils Absolute: 0.1 10*3/uL (ref 0.0–0.1)
Basophils Relative: 1 %
Eosinophils Absolute: 0.1 10*3/uL (ref 0.0–0.5)
Eosinophils Relative: 1 %
HCT: 40.6 % (ref 36.0–46.0)
Hemoglobin: 12.1 g/dL (ref 12.0–15.0)
Immature Granulocytes: 0 %
Lymphocytes Relative: 10 %
Lymphs Abs: 1.1 10*3/uL (ref 0.7–4.0)
MCH: 27.9 pg (ref 26.0–34.0)
MCHC: 29.8 g/dL — ABNORMAL LOW (ref 30.0–36.0)
MCV: 93.8 fL (ref 80.0–100.0)
Monocytes Absolute: 0.9 10*3/uL (ref 0.1–1.0)
Monocytes Relative: 9 %
Neutro Abs: 8.4 10*3/uL — ABNORMAL HIGH (ref 1.7–7.7)
Neutrophils Relative %: 79 %
Platelets: 521 10*3/uL — ABNORMAL HIGH (ref 150–400)
RBC: 4.33 MIL/uL (ref 3.87–5.11)
RDW: 13.7 % (ref 11.5–15.5)
WBC: 10.5 10*3/uL (ref 4.0–10.5)
nRBC: 0 % (ref 0.0–0.2)

## 2022-05-28 LAB — RESP PANEL BY RT-PCR (RSV, FLU A&B, COVID)  RVPGX2
Influenza A by PCR: NEGATIVE
Influenza B by PCR: NEGATIVE
Resp Syncytial Virus by PCR: NEGATIVE
SARS Coronavirus 2 by RT PCR: NEGATIVE

## 2022-05-28 LAB — COMPREHENSIVE METABOLIC PANEL
ALT: 13 U/L (ref 0–44)
AST: 19 U/L (ref 15–41)
Albumin: 3.3 g/dL — ABNORMAL LOW (ref 3.5–5.0)
Alkaline Phosphatase: 54 U/L (ref 38–126)
Anion gap: 10 (ref 5–15)
BUN: 10 mg/dL (ref 8–23)
CO2: 29 mmol/L (ref 22–32)
Calcium: 8.6 mg/dL — ABNORMAL LOW (ref 8.9–10.3)
Chloride: 102 mmol/L (ref 98–111)
Creatinine, Ser: 0.79 mg/dL (ref 0.44–1.00)
GFR, Estimated: >60 mL/min (ref >60)
Glucose, Bld: 93 mg/dL (ref 70–99)
Potassium: 3.9 mmol/L (ref 3.5–5.1)
Sodium: 141 mmol/L (ref 135–145)
Total Bilirubin: 0.5 mg/dL (ref 0.3–1.2)
Total Protein: 6.4 g/dL — ABNORMAL LOW (ref 6.5–8.1)

## 2022-05-28 LAB — TROPONIN I (HIGH SENSITIVITY)
Troponin I (High Sensitivity): 7 ng/L (ref <18)
Troponin I (High Sensitivity): 5 ng/L (ref <18)

## 2022-05-28 LAB — PROCALCITONIN: Procalcitonin: <0.1 ng/mL

## 2022-05-28 LAB — BRAIN NATRIURETIC PEPTIDE: B Natriuretic Peptide: 109.3 pg/mL — ABNORMAL HIGH (ref 0.0–100.0)

## 2022-05-28 NOTE — ED Provider Triage Note (Signed)
Emergency Medicine Provider Triage Evaluation Note  [name] , a 81 y.o. female  was evaluated in triage.  Pt complains of shortness of breath and productive cough for the past 2 to 3 days.  Patient is on 2 L chronically but has been having worsening shortness of breath and has been just up to 3 to 3-1/2 L and is still expensing shortness of breath.  No fevers.  She reports that she is having some lower left leg swelling for the past few months and her doctor recently put her on a furosemide pill for this.  Reports that she is compliant with her Eliquis and never misses a dose.  Review of Systems  Positive:  Negative:   Physical Exam  There were no vitals taken for this visit. Gen:   Awake, no distress   Resp:  Normal effort  MSK:   Moves extremities without difficulty  Other:  Expiratory wheezing. On [place]. No tripoding.  Medical Decision Making  Medically screening exam initiated at 6:31 PM.  Appropriate orders placed.  [name] was informed that the remainder of the evaluation will be completed by another provider, this initial triage assessment does not replace that evaluation, and the importance of remaining in the ED until their evaluation is complete.  Labs and CXR   Sherrell Puller, [place] 05/28/22 [id number]

## 2022-05-29 NOTE — ED Notes (Signed)
Pt stated that she did not want to wait any longer. Pt seen leaving the ED with family.

## 2022-05-30 DIAGNOSIS — J441 Chronic obstructive pulmonary disease with (acute) exacerbation: Secondary | ICD-10-CM | POA: Diagnosis not present

## 2022-05-30 DIAGNOSIS — J0100 Acute maxillary sinusitis, unspecified: Secondary | ICD-10-CM | POA: Diagnosis not present

## 2022-05-30 DIAGNOSIS — R601 Generalized edema: Secondary | ICD-10-CM | POA: Diagnosis not present

## 2022-05-30 DIAGNOSIS — Z79899 Other long term (current) drug therapy: Secondary | ICD-10-CM | POA: Diagnosis not present

## 2022-05-30 DIAGNOSIS — M79604 Pain in right leg: Secondary | ICD-10-CM | POA: Diagnosis not present

## 2022-05-30 DIAGNOSIS — M79605 Pain in left leg: Secondary | ICD-10-CM | POA: Diagnosis not present

## 2022-06-01 DIAGNOSIS — J441 Chronic obstructive pulmonary disease with (acute) exacerbation: Secondary | ICD-10-CM | POA: Diagnosis not present

## 2022-06-01 DIAGNOSIS — R601 Generalized edema: Secondary | ICD-10-CM | POA: Diagnosis not present

## 2022-06-01 DIAGNOSIS — M79604 Pain in right leg: Secondary | ICD-10-CM | POA: Diagnosis not present

## 2022-06-01 DIAGNOSIS — J0100 Acute maxillary sinusitis, unspecified: Secondary | ICD-10-CM | POA: Diagnosis not present

## 2022-06-02 ENCOUNTER — Other Ambulatory Visit: Payer: Self-pay | Admitting: *Deleted

## 2022-06-02 DIAGNOSIS — I871 Compression of vein: Secondary | ICD-10-CM

## 2022-06-12 NOTE — Progress Notes (Signed)
HISTORY AND PHYSICAL     CC:  follow up. Requesting Provider:  [name], [name]  HPI: This is a 82 y.o. female who was originally seen in 2020 for LLE swelling and DVT.   She had a left RP hematoma and then underwent Cook IVC filter in the infrarenal position on 01/13/2019 by [name].  In October 2020, she had the IVC filter removed as she was able to tolerate Eliqius.   In December 2020, she developed post thrombotic syndrome of the LLE and underwent mechanical thrombectomy of the  left common iliac, left external iliac, left common femoral vein and left femoral vein, balloon angioplasty of left femoral vein with 8 mm balloon and stent of left common iliac, left external iliac and left common femoral veins also by [name].  She denies any significant swelling of the left lower extremity since last office visit.  She is still on Eliquis and tolerating it well.  She does not wear compression and has no interest wearing compression in the future.  She is also on a fluid pill which she states helps her leg from swelling.   Past Medical History:  Diagnosis Date   Allergic rhinitis    Anemia due to GI blood loss    Anxiety    Asthma    Chronic airway obstruction, not elsewhere classified    Depressive disorder, not elsewhere classified    DVT (deep venous thrombosis) (HCC)    LLE DVT 01/13/19   Dyspnea    Esophageal reflux    Essential hypertension 08/27/2019   PE (pulmonary thromboembolism) ([place]) 01/29/2019   Pneumonia    Wears partial dentures     Past Surgical History:  Procedure Laterality Date   ABDOMINAL HYSTERECTOMY     ABDOMINAL HYSTERECTOMY     BRONCHIAL WASHINGS  01/24/2022   Procedure: BRONCHIAL WASHINGS;  Surgeon: Juanito Doom, MD;  Location: [place];  Service: Cardiopulmonary;;   CATARACT EXTRACTION W/ INTRAOCULAR LENS  IMPLANT, BILATERAL     CHOLECYSTECTOMY N/A 08/12/2021   Procedure: LAPAROSCOPIC CHOLECYSTECTOMY;  Surgeon: Felicie Morn, MD;   Location: [facility];  Service: General;  Laterality: N/A;   COLONOSCOPY     COLONOSCOPY WITH PROPOFOL N/A 08/20/2019   Procedure: COLONOSCOPY WITH PROPOFOL;  Surgeon: Ronald Lobo, MD;  Location: [place];  Service: Endoscopy;  Laterality: N/A;   CYSTOSCOPY     DILATION AND CURETTAGE OF UTERUS     ECTROPION REPAIR Bilateral 05/12/2021   Procedure: REPAIR OF ECTROPION BY LATERAL TARSAL STRIP OF BILATERAL LOWER EYE LIDS;  Surgeon: Delia Chimes, MD;  Location: [place];  Service: Ophthalmology;  Laterality: Bilateral;   ESOPHAGOGASTRODUODENOSCOPY (EGD) WITH PROPOFOL N/A 08/20/2019   Procedure: ESOPHAGOGASTRODUODENOSCOPY (EGD) WITH PROPOFOL;  Surgeon: Ronald Lobo, MD;  Location: [place];  Service: Endoscopy;  Laterality: N/A;   HEMOSTASIS CONTROL  08/20/2019   Procedure: HEMOSTASIS CONTROL;  Surgeon: Ronald Lobo, MD;  Location: [facility];  Service: Endoscopy;;   INTRAVASCULAR ULTRASOUND/IVUS Left 05/26/2019   Procedure: Intravascular Ultrasound/IVUS;  Surgeon: Waynetta Sandy, MD;  Location: [place] [facility];  Service: Cardiovascular;  Laterality: Left;   IVC FILTER REMOVAL N/A 03/24/2019   Procedure: IVC FILTER REMOVAL;  Surgeon: Waynetta Sandy, MD;  Location: [place] [facility];  Service: Cardiovascular;  Laterality: N/A;   LACRIMAL TUBE INSERTION Right 05/12/2021   Procedure: RIGHT NASOLACRIMAL DUCT PROBE WITH MINI MONOKA STENT PLACEMENT;  Surgeon: Delia Chimes, MD;  Location: [place];  Service: Ophthalmology;  Laterality:  Right;   LESION EXCISION WITH COMPLEX REPAIR N/A 05/12/2021   Procedure: NASAL LESION EXCISION;  Surgeon: Delia Chimes, MD;  Location: [place];  Service: Ophthalmology;  Laterality: N/A;   LOWER EXTREMITY VENOGRAPHY Left 05/26/2019   Procedure: LOWER EXTREMITY VENOGRAPHY;  Surgeon: Waynetta Sandy, MD;  Location: [place] [facility];  Service: Cardiovascular;  Laterality: Left;   MULTIPLE TOOTH EXTRACTIONS     NASAL SINUS  SURGERY     PERIPHERAL VASCULAR INTERVENTION Left 05/26/2019   Procedure: PERIPHERAL VASCULAR INTERVENTION;  Surgeon: Waynetta Sandy, MD;  Location: [place] [facility];  Service: Cardiovascular;  Laterality: Left;  lower extremity veins   PERIPHERAL VASCULAR THROMBECTOMY  05/26/2019   Procedure: PERIPHERAL VASCULAR THROMBECTOMY;  Surgeon: Waynetta Sandy, MD;  Location: [place] [facility];  Service: Cardiovascular;;   PTOSIS REPAIR Bilateral 05/12/2021   Procedure: EXTERNAL PTOSIS REPAIR LEVATOR ADVANCEDMENT/RESECTION OF BILATERAL UPPER EYE LIDS;  Surgeon: Delia Chimes, MD;  Location: [place];  Service: Ophthalmology;  Laterality: Bilateral;   TUBAL LIGATION     VENA CAVA FILTER PLACEMENT N/A 01/13/2019   Procedure: INSERTION VENA-CAVA FILTER;  Surgeon: Waynetta Sandy, MD;  Location: [place];  Service: Vascular;  Laterality: N/A;   VIDEO BRONCHOSCOPY N/A 01/24/2022   Procedure: VIDEO BRONCHOSCOPY WITHOUT FLUORO;  Surgeon: Juanito Doom, MD;  Location: [place];  Service: Cardiopulmonary;  Laterality: N/A;    Allergies  Allergen Reactions   Pneumococcal Vaccines Shortness Of Breath and Other (See Comments)    Couldn't breathe    Clarithromycin Swelling   Estradiol Rash    Reaction to cream   Sulfa Antibiotics Nausea And Vomiting and Swelling   Doxycycline Hyclate Other (See Comments)    stomach distress   Pneumococcal Polysaccharide Vaccine Other (See Comments)    Unknown   Sulfacetamide Sodium-Sulfur Other (See Comments)   Sulfonamide Derivatives Swelling    Current Outpatient Medications  Medication Sig Dispense Refill   acetaminophen (TYLENOL) 500 MG tablet Take 500 mg by mouth every 6 (six) hours as needed for headache (pain).     albuterol (PROVENTIL) (2.5 MG/3ML) 0.083% nebulizer solution Take 3 mLs (2.5 mg total) by nebulization every 6 (six) hours as needed for wheezing or shortness of breath. 75 mL 12   albuterol (VENTOLIN HFA) 108 (90  Base) MCG/ACT inhaler Inhale 1-2 puffs into the lungs every 6 (six) hours as needed for wheezing or shortness of breath.     amoxicillin-clavulanate (AUGMENTIN) 875-125 MG tablet Take 1 tablet by mouth 2 (two) times daily. 5 tablet 0   APIXABAN (ELIQUIS) VTE STARTER PACK ('10MG'$  AND '5MG'$ ) Take as directed on package: start with two-'5mg'$  tablets twice daily for 7 days. On day 8, switch to one-'5mg'$  tablet twice daily. 74 each 0   Budeson-Glycopyrrol-Formoterol (BREZTRI AEROSPHERE) 160-9-4.8 MCG/ACT AERO Inhale 2 puffs into the lungs in the morning and at bedtime. 10.7 g 2   escitalopram (LEXAPRO) 10 MG tablet Take 1 tablet (10 mg total) by mouth daily. 30 tablet 2   LORazepam (ATIVAN) 1 MG tablet Take 1 mg by mouth at bedtime.     omeprazole (PRILOSEC) 20 MG capsule Take 20 mg by mouth daily.     predniSONE (DELTASONE) 10 MG tablet Take 4 tablets (40 mg total) by mouth daily with breakfast for 3 days, THEN 3 tablets (30 mg total) daily with breakfast for 3 days, THEN 2 tablets (20 mg total) daily with breakfast for 3 days, THEN 1 tablet (10 mg total) daily with  breakfast for 3 days. 20 tablet 0   traZODone (DESYREL) 100 MG tablet Take 100 mg by mouth at bedtime.     triamterene-hydrochlorothiazide (MAXZIDE-25) 37.5-25 MG tablet Take 0.5 tablets by mouth daily.     No current facility-administered medications for this visit.    Family History  Problem Relation Age of Onset   Diabetes Mother    Coronary artery disease Mother    Emphysema Father    Coronary artery disease Brother     Social History   Socioeconomic History   Marital status: Single    Spouse name: Not on file   Number of children: Not on file   Years of education: Not on file   Highest education level: Not on file  Occupational History   Occupation: Disabled  Tobacco Use   Smoking status: Former    Packs/day: 0.25    Years: 40.00    Total pack years: 10.00    Types: Cigarettes    Quit date: 2000    Years since quitting:  24.0   Smokeless tobacco: Never  Vaping Use   Vaping Use: Never used  Substance and Sexual Activity   Alcohol use: No   Drug use: No   Sexual activity: Not on file  Other Topics Concern   Not on file  Social History Narrative   Not on file   Social Determinants of Health   Financial Resource Strain: Not on file  Food Insecurity: No Food Insecurity (02/13/2022)   Hunger Vital Sign    Worried About Running Out of Food in the Last Year: Never true    Ran Out of Food in the Last Year: Never true  Transportation Needs: No Transportation Needs (02/13/2022)   PRAPARE - Hydrologist (Medical): No    Lack of Transportation (Non-Medical): No  Physical Activity: Not on file  Stress: Not on file  Social Connections: Not on file  Intimate Partner Violence: Not At Risk (02/13/2022)   Humiliation, Afraid, Rape, and Kick questionnaire    Fear of Current or Ex-Partner: No    Emotionally Abused: No    Physically Abused: No    Sexually Abused: No     REVIEW OF SYSTEMS:   '[X]'$  denotes positive finding, '[ ]'$  denotes negative finding Cardiac  Comments:  Chest pain or chest pressure:    Shortness of breath upon exertion:    Short of breath when lying flat:    Irregular heart rhythm:        Vascular    Pain in calf, thigh, or hip brought on by ambulation:    Pain in feet at night that wakes you up from your sleep:     Blood clot in your veins:    Leg swelling:         Pulmonary    Oxygen at home:    Productive cough:     Wheezing:         Neurologic    Sudden weakness in arms or legs:     Sudden numbness in arms or legs:     Sudden onset of difficulty speaking or slurred speech:    Temporary loss of vision in one eye:     Problems with dizziness:         Gastrointestinal    Blood in stool:     Vomited blood:         Genitourinary    Burning when urinating:     Blood in urine:  Psychiatric    Major depression:         Hematologic     Bleeding problems:    Problems with blood clotting too easily:        Skin    Rashes or ulcers:        Constitutional    Fever or chills:      PHYSICAL EXAMINATION:  Today's Vitals   06/14/22 1008  BP: 136/85  Pulse: 89  Resp: 20  Temp: 97.9 F (36.6 C)  SpO2: 90%  Weight: 158 lb (71.7 kg)  Height: '5\' 6"'$  (1.676 m)   Body mass index is 25.5 kg/m.   General:  WDWN in NAD; vital signs documented above Gait: Not observed HENT: WNL, normocephalic Pulmonary: normal non-labored breathing , without wheezing Cardiac: regular HR Skin: without rashes Vascular Exam/Pulses:  Right Left  Radial 2+ (normal) 2+ (normal)  DP 2+ (normal) 2+ (normal)   Extremities: without ischemic changes, without Gangrene , without cellulitis; without open wounds Musculoskeletal: no muscle wasting or atrophy  Neurologic: A&O X 3 Psychiatric:  The pt has Normal affect.   Non-Invasive Vascular Imaging:   Venous duplex 06/14/2022 Patent IVC Patent left common iliac vein stents Patent left external iliac vein    ASSESSMENT/PLAN:: 82 y.o. female here for follow up who was originally seen in 2020 for LLE swelling and DVT.   She had a left RP hematoma and then underwent Cook IVC filter in the infrarenal position on 01/13/2019 by [name].  In October 2020, she had the IVC filter removed as she was able to tolerate Eliqius.     She continues to tolerate Eliquis daily.  She denies any significant edema since last office visit.  I encouraged her to wear at least knee-high compression however she has no interest in wearing compression.  I also recommended elevation of the legs periodically above the level of her heart throughout the day.  She should also avoid prolonged sitting and standing.  We will repeat IVC and left iliac venous duplex in 1 year per protocol.   Dagoberto Ligas, [facility] [phone]  Clinic MD:   Scot Dock

## 2022-06-13 ENCOUNTER — Ambulatory Visit (INDEPENDENT_AMBULATORY_CARE_PROVIDER_SITE_OTHER): Payer: PPO

## 2022-06-13 ENCOUNTER — Ambulatory Visit (INDEPENDENT_AMBULATORY_CARE_PROVIDER_SITE_OTHER): Payer: PPO | Admitting: Pulmonary Disease

## 2022-06-13 ENCOUNTER — Encounter: Payer: Self-pay | Admitting: Pulmonary Disease

## 2022-06-13 VITALS — BP 134/80 | HR 98 | Temp 98.4°F | Ht 66.0 in | Wt 159.0 lb

## 2022-06-13 DIAGNOSIS — J441 Chronic obstructive pulmonary disease with (acute) exacerbation: Secondary | ICD-10-CM | POA: Diagnosis not present

## 2022-06-13 DIAGNOSIS — J069 Acute upper respiratory infection, unspecified: Secondary | ICD-10-CM

## 2022-06-13 DIAGNOSIS — R059 Cough, unspecified: Secondary | ICD-10-CM

## 2022-06-13 DIAGNOSIS — J449 Chronic obstructive pulmonary disease, unspecified: Secondary | ICD-10-CM | POA: Diagnosis not present

## 2022-06-13 DIAGNOSIS — I2699 Other pulmonary embolism without acute cor pulmonale: Secondary | ICD-10-CM

## 2022-06-13 MED ORDER — PREDNISONE 10 MG PO TABS: ORAL_TABLET | ORAL | 0 refills | Status: AC

## 2022-06-13 MED ORDER — AMOXICILLIN-POT CLAVULANATE 875-125 MG PO TABS: 1.0000 | ORAL_TABLET | Freq: Two times a day (BID) | ORAL | 0 refills | Status: DC

## 2022-06-13 NOTE — Progress Notes (Signed)
Synopsis: Followed by [name] for many years for COPD and chronic respiratory failure with hypoxemia, in 2023 had a series of COPD exacerbations and by August 2023 had a right lower lobe lung abscess noted on CT scanning.  Re-admitted 01/2022 with pulmonary embolism.  Subjective:   PATIENT ID: [name] GENDER: female DOB: [birth year], MRN: [id number]   HPI  Chief Complaint  Patient presents with   Follow-up    Sore throat, coughing, sputum (yellow) sinus drainage .   [name] tested positive for COVID 6 weeks ago, she took an antiviral that cost her $280.  She worsened later and ended up back in the doctor's office and took augmentin and lasix. She was also given a steroid injection.  She improved again. Right now she feels worse with scratchy throat, coughing up mucus (yellow), no fever, some body aches (shoulder or neck).   Past Medical History:  Diagnosis Date   Allergic rhinitis    Anemia due to GI blood loss    Anxiety    Asthma    Chronic airway obstruction, not elsewhere classified    Depressive disorder, not elsewhere classified    DVT (deep venous thrombosis) (HCC)    LLE DVT 01/13/19   Dyspnea    Esophageal reflux    Essential hypertension 08/27/2019   PE (pulmonary thromboembolism) ([place]) 01/29/2019   Pneumonia    Wears partial dentures     Review of Systems  Constitutional:  Positive for malaise/fatigue. Negative for chills, fever and weight loss.  HENT:  Negative for congestion and sinus pain.   Respiratory:  Positive for cough, sputum production and shortness of breath. Negative for hemoptysis, wheezing and stridor.   Cardiovascular:  Negative for palpitations, orthopnea, claudication and leg swelling.      Objective:  Physical Exam   Vitals:   06/13/22 1427  BP: 134/80  Pulse: 98  Temp: 98.4 F (36.9 C)  TempSrc: Oral  SpO2: 91%  Weight: 159 lb (72.1 kg)  Height: '5\' 6"'$  (1.676 m)   RA  Gen: well appearing HENT: OP clear, neck  supple PULM: CTA B, normal effort  CV: RRR, no mgr GI: BS+, soft, nontender Derm: no cyanosis or rash Psyche: normal mood and affect   CBC    Component Value Date/Time   WBC 10.5 05/28/2022 1829   RBC 4.33 05/28/2022 1829   HGB 12.1 05/28/2022 1829   HCT 40.6 05/28/2022 1829   PLT 521 (H) 05/28/2022 1829   MCV 93.8 05/28/2022 1829   MCH 27.9 05/28/2022 1829   MCHC 29.8 (L) 05/28/2022 1829   RDW 13.7 05/28/2022 1829   LYMPHSABS 1.1 05/28/2022 1829   MONOABS 0.9 05/28/2022 1829   EOSABS 0.1 05/28/2022 1829   BASOSABS 0.1 05/28/2022 1829     Chest imaging: February 02, 2022 chest x-ray images independently reviewed showing a stable right lower lobe air-fluid level in a likely abscess.  Radiology mentions a second 2.3 cm nodule in the right lower lobe, less conspicuous Sept 18th 2023 CT angiogram chest images independently reviewed showing decreased size of infected right lower lobe bulla, near complete resolution of pneumonia, positive for pulmonary embolism with segmental clots bilaterally, RV LV ratio 1.7  PFT: 2015 spiro FEV1/FVC 0.51 FVC 2.13/ 65%, FEV1 1.08/ 44% 2018 spiro ratio 0.43 FEV1 1.04/44%,   Labs: 01/25/2022 CBC WBC 14.9, Hgb 14.3 gm/dL  Path:  Echo:  Heart Catheterization:      Assessment & Plan:   Cough, unspecified type  Upper respiratory  tract infection, unspecified type - Plan: DG Chest 2 View, COVID-19, Flu A+B and RSV  COPD with acute exacerbation ([place])  Recurrent pulmonary embolism ([place])  Discussion: Unfortunately [name] is having worsening control of her COPD and is experiencing another exacerbation.  Last year she had a severe exacerbation in the setting of cavitary pneumonia which led to a prolonged hospitalization.  Given this, we need to be cautious with her to avoid recurrence of what happened a year ago.  Currently, it is possible she may be experiencing another viral illness but I think with more likely is COVID is causing her to  have persistent COPD exacerbation and bronchitis again.  Acute COPD exacerbation after COVID infection: Chest x-ray today Prednisone: Take '40mg'$  po daily for 3 days, then take '30mg'$  po daily for 3 days, then take '20mg'$  po daily for two days, then take '10mg'$  po daily for 2 days Augmentin 875 bid x 5 days Flu/COVID/RSV swab today Continue Breztri 2 puff bid   Recurrent DVT/PE Eliquis to continue  We will see you back in 3 months or sooner if needed.    Immunizations: Immunization History  Administered Date(s) Administered   Fluad Quad(high Dose 65+) 01/10/2021   Influenza Split 02/27/2011, 02/27/2012, 02/26/2013, 02/26/2014, 02/27/2016, 03/25/2017   Influenza Whole 02/07/2009   Influenza, High Dose Seasonal PF 03/17/2014, 02/27/2017, 03/27/2018   Influenza, Quadrivalent, Recombinant, Inj, Pf 02/25/2018, 04/08/2019, 03/05/2020, 02/11/2021   Influenza,inj,Quad PF,6+ Mos 03/29/2022   Influenza-Unspecified 01/27/2013, 03/05/2015, 03/05/2020   PFIZER(Purple Top)SARS-COV-2 Vaccination 07/12/2019, 10/06/2019   Tdap 10/07/2013, 04/23/2022     Current Outpatient Medications:    acetaminophen (TYLENOL) 500 MG tablet, Take 500 mg by mouth every 6 (six) hours as needed for headache (pain)., Disp: , Rfl:    albuterol (PROVENTIL) (2.5 MG/3ML) 0.083% nebulizer solution, Take 3 mLs (2.5 mg total) by nebulization every 6 (six) hours as needed for wheezing or shortness of breath., Disp: 75 mL, Rfl: 12   albuterol (VENTOLIN HFA) 108 (90 Base) MCG/ACT inhaler, Inhale 1-2 puffs into the lungs every 6 (six) hours as needed for wheezing or shortness of breath., Disp: , Rfl:    amoxicillin-clavulanate (AUGMENTIN) 875-125 MG tablet, Take 1 tablet by mouth 2 (two) times daily., Disp: 5 tablet, Rfl: 0   APIXABAN (ELIQUIS) VTE STARTER PACK ('10MG'$  AND '5MG'$ ), Take as directed on package: start with two-'5mg'$  tablets twice daily for 7 days. On day 8, switch to one-'5mg'$  tablet twice daily., Disp: 74 each, Rfl: 0    Budeson-Glycopyrrol-Formoterol (BREZTRI AEROSPHERE) 160-9-4.8 MCG/ACT AERO, Inhale 2 puffs into the lungs in the morning and at bedtime., Disp: 10.7 g, Rfl: 2   escitalopram (LEXAPRO) 10 MG tablet, Take 1 tablet (10 mg total) by mouth daily., Disp: 30 tablet, Rfl: 2   LORazepam (ATIVAN) 1 MG tablet, Take 1 mg by mouth at bedtime., Disp: , Rfl:    omeprazole (PRILOSEC) 20 MG capsule, Take 20 mg by mouth daily., Disp: , Rfl:    predniSONE (DELTASONE) 10 MG tablet, Take 4 tablets (40 mg total) by mouth daily with breakfast for 3 days, THEN 3 tablets (30 mg total) daily with breakfast for 3 days, THEN 2 tablets (20 mg total) daily with breakfast for 3 days, THEN 1 tablet (10 mg total) daily with breakfast for 3 days., Disp: 20 tablet, Rfl: 0   traZODone (DESYREL) 100 MG tablet, Take 100 mg by mouth at bedtime., Disp: , Rfl:    triamterene-hydrochlorothiazide (MAXZIDE-25) 37.5-25 MG tablet, Take 0.5 tablets by mouth daily., Disp: ,  Rfl:

## 2022-06-13 NOTE — Patient Instructions (Signed)
Acute COPD exacerbation after COVID infection: Chest x-ray today Prednisone: Take '40mg'$  po daily for 3 days, then take '30mg'$  po daily for 3 days, then take '20mg'$  po daily for two days, then take '10mg'$  po daily for 2 days Augmentin 875 bid x 5 days Flu/COVID/RSV swab today Continue Breztri 2 puff bid   Recurrent DVT/PE Eliquis to continue  We will see you back in 3 months or sooner if needed.

## 2022-06-14 ENCOUNTER — Ambulatory Visit (INDEPENDENT_AMBULATORY_CARE_PROVIDER_SITE_OTHER): Payer: PPO | Admitting: Physician Assistant

## 2022-06-14 ENCOUNTER — Ambulatory Visit (HOSPITAL_COMMUNITY)
Admission: RE | Admit: 2022-06-14 | Discharge: 2022-06-14 | Disposition: A | Discharge status: Home / Self Care | Payer: PPO | Source: Ambulatory Visit | Attending: Vascular Surgery | Admitting: Vascular Surgery

## 2022-06-14 VITALS — BP 136/85 | HR 89 | Temp 97.9°F | Resp 20 | Ht 66.0 in | Wt 158.0 lb

## 2022-06-14 DIAGNOSIS — I871 Compression of vein: Secondary | ICD-10-CM | POA: Insufficient documentation

## 2022-06-14 DIAGNOSIS — Z95828 Presence of other vascular implants and grafts: Secondary | ICD-10-CM

## 2022-06-15 LAB — COVID-19, FLU A+B AND RSV
Influenza A, NAA: NOT DETECTED
Influenza B, NAA: NOT DETECTED
RSV, NAA: NOT DETECTED
SARS-CoV-2, NAA: NOT DETECTED

## 2022-06-21 ENCOUNTER — Telehealth: Payer: Self-pay | Admitting: Pulmonary Disease

## 2022-06-21 ENCOUNTER — Other Ambulatory Visit: Payer: Self-pay

## 2022-06-21 MED ORDER — BREZTRI AEROSPHERE 160-9-4.8 MCG/ACT IN AERO: 2.0000 | INHALATION_SPRAY | Freq: Two times a day (BID) | RESPIRATORY_TRACT | 2 refills | Status: DC

## 2022-06-21 NOTE — Telephone Encounter (Signed)
Spoke to patient. Advised refill of [name] has been sent to pharmacy. Nothing further needed.

## 2022-06-22 DIAGNOSIS — J449 Chronic obstructive pulmonary disease, unspecified: Secondary | ICD-10-CM | POA: Diagnosis not present

## 2022-06-29 ENCOUNTER — Telehealth: Payer: Self-pay | Admitting: Pulmonary Disease

## 2022-06-29 MED ORDER — PREDNISONE 10 MG PO TABS: 20.0000 mg | ORAL_TABLET | Freq: Every day | ORAL | 0 refills | Status: DC

## 2022-06-29 NOTE — Telephone Encounter (Signed)
Pt calling. Was in to see Dr. Lake Bells recently. Has a bad cold and would like Dr. Jerilynn Cook to call in something for her to relive symptoms. Pls call @ [phone]   Pharm: [facility] on [address]

## 2022-06-29 NOTE — Telephone Encounter (Signed)
Notified pt Prednisone would be called in for her and reviewed education with pt. Pt stated understanding. Nothing further needed at this time.

## 2022-06-29 NOTE — Telephone Encounter (Signed)
Spoke with pt who states her cough and wheeze have not improved since last OV on 06/13/22 despite completing Augmentin and Prednisone taper. Pt denies fever/ chills/ GI upset. Pt was swabbed at last OV. Pt verified pharmacy as [facility] on [address]. Dr. Lake Bells please advise.

## 2022-06-30 DIAGNOSIS — J441 Chronic obstructive pulmonary disease with (acute) exacerbation: Secondary | ICD-10-CM | POA: Diagnosis not present

## 2022-06-30 DIAGNOSIS — J209 Acute bronchitis, unspecified: Secondary | ICD-10-CM | POA: Diagnosis not present

## 2022-06-30 DIAGNOSIS — J9801 Acute bronchospasm: Secondary | ICD-10-CM | POA: Diagnosis not present

## 2022-07-18 ENCOUNTER — Telehealth: Payer: Self-pay | Admitting: *Deleted

## 2022-07-18 NOTE — Patient Outreach (Signed)
  Care Coordination   07/18/2022 Name: [name] MRN: [id number] DOB: 14-Apr-1941   Care Coordination Outreach Attempts:  An unsuccessful telephone outreach was attempted today to offer the patient information about available care coordination services as a benefit of their health plan.   Follow Up Plan:  Additional outreach attempts will be made to offer the patient care coordination information and services.   Encounter Outcome:  No Answer   Care Coordination Interventions:  No, not indicated    Eduard Clos, MSW, [place] Worker [facility] [phone]

## 2022-07-23 DIAGNOSIS — J449 Chronic obstructive pulmonary disease, unspecified: Secondary | ICD-10-CM | POA: Diagnosis not present

## 2022-08-02 DIAGNOSIS — M25571 Pain in right ankle and joints of right foot: Secondary | ICD-10-CM | POA: Diagnosis not present

## 2022-08-21 DIAGNOSIS — J449 Chronic obstructive pulmonary disease, unspecified: Secondary | ICD-10-CM | POA: Diagnosis not present

## 2022-08-22 ENCOUNTER — Encounter: Payer: Self-pay | Admitting: Internal Medicine

## 2022-08-22 ENCOUNTER — Ambulatory Visit: Payer: PPO | Admitting: Internal Medicine

## 2022-08-22 ENCOUNTER — Ambulatory Visit (INDEPENDENT_AMBULATORY_CARE_PROVIDER_SITE_OTHER): Payer: PPO

## 2022-08-22 VITALS — BP 124/76 | HR 94 | Temp 97.6°F | Ht 66.0 in | Wt 169.4 lb

## 2022-08-22 DIAGNOSIS — J441 Chronic obstructive pulmonary disease with (acute) exacerbation: Secondary | ICD-10-CM

## 2022-08-22 DIAGNOSIS — J9621 Acute and chronic respiratory failure with hypoxia: Secondary | ICD-10-CM

## 2022-08-22 MED ORDER — PREDNISONE 10 MG PO TABS: ORAL_TABLET | ORAL | 0 refills | Status: AC

## 2022-08-22 MED ORDER — METHYLPREDNISOLONE ACETATE 80 MG/ML IJ SUSP
80.0000 mg | Freq: Once | INTRAMUSCULAR | Status: AC
  Administered 2022-08-22: 80 mg via INTRAMUSCULAR

## 2022-08-22 MED ORDER — METHYLPREDNISOLONE SODIUM SUCC 40 MG IJ SOLR: 40.0000 mg | Freq: Once | INTRAMUSCULAR | Status: DC

## 2022-08-22 NOTE — Progress Notes (Signed)
[name]    [id number]    February 18, 1941  Primary Care 2, [name], [name] Date of Appointment: 08/22/2022 Established Patient Visit  Chief complaint:   Chief Complaint  Patient presents with   Acute Visit    Cough with yellow mucus, chest congestion and nasal congestion with yellow mucus x 6 days.       HPI: [name] is a 82 y.o. with COPD on home oxygen. Frequent exacerbations. Former patient of Dr. [place] Bells.  Acute PE, submassive in Sept 2023 - on eliquis History of cavitary pneumonia in 2023.  Interval Updates:  Here for an acute visit. Last treated for COPD exacerbation in Jan 2024. Miantenance therapy is breztri 2 puffs twice a day. Using albuterol nebs every 4 hours.  Acute pulmonary embolism on eliquis.   I have reviewed the patient's family social and past medical history and updated as appropriate.   Past Medical History:  Diagnosis Date   Allergic rhinitis    Anemia due to GI blood loss    Anxiety    Asthma    Chronic airway obstruction, not elsewhere classified    Depressive disorder, not elsewhere classified    DVT (deep venous thrombosis) (HCC)    LLE DVT 01/13/19   Dyspnea    Esophageal reflux    Essential hypertension 08/27/2019   PE (pulmonary thromboembolism) ([place]) 01/29/2019   Pneumonia    Wears partial dentures     Past Surgical History:  Procedure Laterality Date   ABDOMINAL HYSTERECTOMY     ABDOMINAL HYSTERECTOMY     BRONCHIAL WASHINGS  01/24/2022   Procedure: BRONCHIAL WASHINGS;  Surgeon: Juanito Doom, MD;  Location: [place];  Service: Cardiopulmonary;;   CATARACT EXTRACTION W/ INTRAOCULAR LENS  IMPLANT, BILATERAL     CHOLECYSTECTOMY N/A 08/12/2021   Procedure: LAPAROSCOPIC CHOLECYSTECTOMY;  Surgeon: Felicie Morn, MD;  [facility]: [facility];  Service: General;  Laterality: N/A;   COLONOSCOPY     COLONOSCOPY WITH PROPOFOL N/A 08/20/2019   Procedure: COLONOSCOPY WITH PROPOFOL;  Surgeon: Ronald Lobo, MD;  Location: [place];  Service: Endoscopy;  Laterality: N/A;   CYSTOSCOPY     DILATION AND CURETTAGE OF UTERUS     ECTROPION REPAIR Bilateral 05/12/2021   Procedure: REPAIR OF ECTROPION BY LATERAL TARSAL STRIP OF BILATERAL LOWER EYE LIDS;  Surgeon: Delia Chimes, MD;  Location: [place];  Service: Ophthalmology;  Laterality: Bilateral;   ESOPHAGOGASTRODUODENOSCOPY (EGD) WITH PROPOFOL N/A 08/20/2019   Procedure: ESOPHAGOGASTRODUODENOSCOPY (EGD) WITH PROPOFOL;  Surgeon: Ronald Lobo, MD;  Location: [place];  Service: Endoscopy;  Laterality: N/A;   HEMOSTASIS CONTROL  08/20/2019   Procedure: HEMOSTASIS CONTROL;  Surgeon: Ronald Lobo, MD;  Location: [facility] ENDOSCOPY;  Service: Endoscopy;;   INTRAVASCULAR ULTRASOUND/IVUS Left 05/26/2019   Procedure: Intravascular Ultrasound/IVUS;  Surgeon: Waynetta Sandy, MD;  Location: [place] [facility];  Service: Cardiovascular;  Laterality: Left;   IVC FILTER REMOVAL N/A 03/24/2019   Procedure: IVC FILTER REMOVAL;  Surgeon: Waynetta Sandy, MD;  Location: [place] [facility];  Service: Cardiovascular;  Laterality: N/A;   LACRIMAL TUBE INSERTION Right 05/12/2021   Procedure: RIGHT NASOLACRIMAL DUCT PROBE WITH MINI MONOKA STENT PLACEMENT;  Surgeon: Delia Chimes, MD;  Location: [place];  Service: Ophthalmology;  Laterality: Right;   LESION EXCISION WITH COMPLEX REPAIR N/A 05/12/2021   Procedure: NASAL LESION EXCISION;  Surgeon: Delia Chimes, MD;  Location: [place];  Service: Ophthalmology;  Laterality: N/A;  LOWER EXTREMITY VENOGRAPHY Left 05/26/2019   Procedure: LOWER EXTREMITY VENOGRAPHY;  Surgeon: Waynetta Sandy, MD;  Location: [place] [facility];  Service: Cardiovascular;  Laterality: Left;   MULTIPLE TOOTH EXTRACTIONS     NASAL SINUS SURGERY     PERIPHERAL VASCULAR INTERVENTION Left 05/26/2019   Procedure: PERIPHERAL VASCULAR INTERVENTION;  Surgeon: Waynetta Sandy, MD;  Location: [place]  CV LAB;  Service: Cardiovascular;  Laterality: Left;  lower extremity veins   PERIPHERAL VASCULAR THROMBECTOMY  05/26/2019   Procedure: PERIPHERAL VASCULAR THROMBECTOMY;  Surgeon: Waynetta Sandy, MD;  Location: [place] [facility];  Service: Cardiovascular;;   PTOSIS REPAIR Bilateral 05/12/2021   Procedure: EXTERNAL PTOSIS REPAIR LEVATOR ADVANCEDMENT/RESECTION OF BILATERAL UPPER EYE LIDS;  Surgeon: Delia Chimes, MD;  Location: [place];  Service: Ophthalmology;  Laterality: Bilateral;   TUBAL LIGATION     VENA CAVA FILTER PLACEMENT N/A 01/13/2019   Procedure: INSERTION VENA-CAVA FILTER;  Surgeon: Waynetta Sandy, MD;  Location: [place];  Service: Vascular;  Laterality: N/A;   VIDEO BRONCHOSCOPY N/A 01/24/2022   Procedure: VIDEO BRONCHOSCOPY WITHOUT FLUORO;  Surgeon: Juanito Doom, MD;  Location: [place];  Service: Cardiopulmonary;  Laterality: N/A;    Family History  Problem Relation Age of Onset   Diabetes Mother    Coronary artery disease Mother    Emphysema Father    Coronary artery disease Brother     Social History   Occupational History   Occupation: Disabled  Tobacco Use   Smoking status: Former    Packs/day: 0.25    Years: 40.00    Additional pack years: 0.00    Total pack years: 10.00    Types: Cigarettes    Quit date: 2000    Years since quitting: 24.2   Smokeless tobacco: Never  Vaping Use   Vaping Use: Never used  Substance and Sexual Activity   Alcohol use: No   Drug use: No   Sexual activity: Not on file     Physical Exam: Blood pressure 124/76, pulse 94, temperature 97.6 F (36.4 C), temperature source Oral, height 5\' 6"  (1.676 m), weight 169 lb 6.4 oz (76.8 kg), SpO2 (!) 85 %.  Gen:      No acute distress, elderly ENT:  on nasal cannula no nasal polyps, mucus membranes moist Lungs:    breath sounds diminished bilaterally with end expiratory wheezes CV:         tachycardic, regular, no mrg   Data Reviewed: Imaging: I  have personally reviewed the chest xray 3/26 which shows no evidence of lobar pneumonia.   PFTs:      No data to display         I have personally reviewed the patient's PFTs and FEV1 48% of predicted in 2018.   Labs: Lab Results  Component Value Date   NA 141 05/28/2022   K 3.9 05/28/2022   CO2 29 05/28/2022   GLUCOSE 93 05/28/2022   BUN 10 05/28/2022   CREATININE 0.79 05/28/2022   CALCIUM 8.6 (L) 05/28/2022   GFRNONAA >60 05/28/2022   Lab Results  Component Value Date   WBC 10.5 05/28/2022   HGB 12.1 05/28/2022   HCT 40.6 05/28/2022   MCV 93.8 05/28/2022   PLT 521 (H) 05/28/2022    Immunization status: Immunization History  Administered Date(s) Administered   Fluad Quad(high Dose 65+) 01/10/2021   Influenza Split 02/27/2011, 02/27/2012, 02/26/2013, 02/26/2014, 02/27/2016, 03/25/2017   Influenza Whole 02/07/2009   Influenza, High Dose Seasonal  PF 03/17/2014, 02/27/2017, 03/27/2018   Influenza, Quadrivalent, Recombinant, Inj, Pf 02/25/2018, 04/08/2019, 03/05/2020, 02/11/2021   Influenza,inj,Quad PF,6+ Mos 03/29/2022   Influenza-Unspecified 01/27/2013, 03/05/2015, 03/05/2020   PFIZER(Purple Top)SARS-COV-2 Vaccination 07/12/2019, 10/06/2019   Tdap 10/07/2013, 04/23/2022    External Records Personally Reviewed: pulmonary, hospital stay  Assessment:  Severe COPD FEV1 48% of predicted with acute exacerbation Acute pulmonary embolism on eliquis   Plan/Recommendations:  40 mg solumedrol IM in office today Prednisone taper sent to pharmacy Continue breztri and nebs every 4 hours at home.  Chest xray in office shows no evidence of lobar pneumonia on my interpretation.  She will call us if she can't keep oxygen levels over 88% at home. Weighed utility of hospitalization at this time and I think we can avoid for now. She will call us if she worsens.   I spent 40 minutes on 08/22/2022 in care of this patient including face to face time and non-face to face time spent  charting, review of outside records, and coordination of care.   Return to Care: With Dr. Valeta Harms as scheduled.    Lenice Llamas, MD [facility]

## 2022-08-22 NOTE — Patient Instructions (Addendum)
Follow up with Dr. Valeta Harms as scheduled.  You received a steroid shot in the office today.   Please take the prednisone taper as prescribed over the next 12 days.   Your chest xray today shows no pneumonia.  Keep taking albuterol inhaler as needed - for now would take nebulizer treatments every 4 hours as you are doing. If your oxygen levels are dropping on 2L and you're not able to keep them over 88% please call and let us know.

## 2022-08-28 NOTE — Progress Notes (Signed)
Called and spoke with patient, advised of results/recommendations per Rexene Edison NP.  She states she is still coughing up mucous, but feels like she may be better today.  Advised to call back if she continues not to improve.  She verbalized understanding.  Nothing further needed.

## 2022-09-04 DIAGNOSIS — N390 Urinary tract infection, site not specified: Secondary | ICD-10-CM | POA: Diagnosis not present

## 2022-09-04 DIAGNOSIS — R5383 Other fatigue: Secondary | ICD-10-CM | POA: Diagnosis not present

## 2022-09-04 DIAGNOSIS — R601 Generalized edema: Secondary | ICD-10-CM | POA: Diagnosis not present

## 2022-09-04 DIAGNOSIS — E7800 Pure hypercholesterolemia, unspecified: Secondary | ICD-10-CM | POA: Diagnosis not present

## 2022-09-04 DIAGNOSIS — M79604 Pain in right leg: Secondary | ICD-10-CM | POA: Diagnosis not present

## 2022-09-11 ENCOUNTER — Ambulatory Visit: Payer: PPO | Admitting: Pulmonary Disease

## 2022-09-11 ENCOUNTER — Encounter: Payer: Self-pay | Admitting: Pulmonary Disease

## 2022-09-11 VITALS — BP 140/80 | HR 64 | Ht 66.0 in | Wt 167.6 lb

## 2022-09-11 DIAGNOSIS — I2699 Other pulmonary embolism without acute cor pulmonale: Secondary | ICD-10-CM | POA: Diagnosis not present

## 2022-09-11 DIAGNOSIS — B370 Candidal stomatitis: Secondary | ICD-10-CM | POA: Diagnosis not present

## 2022-09-11 DIAGNOSIS — J449 Chronic obstructive pulmonary disease, unspecified: Secondary | ICD-10-CM

## 2022-09-11 DIAGNOSIS — I5032 Chronic diastolic (congestive) heart failure: Secondary | ICD-10-CM

## 2022-09-11 DIAGNOSIS — E7800 Pure hypercholesterolemia, unspecified: Secondary | ICD-10-CM | POA: Diagnosis not present

## 2022-09-11 DIAGNOSIS — Z0000 Encounter for general adult medical examination without abnormal findings: Secondary | ICD-10-CM | POA: Diagnosis not present

## 2022-09-11 DIAGNOSIS — J984 Other disorders of lung: Secondary | ICD-10-CM

## 2022-09-11 DIAGNOSIS — R059 Cough, unspecified: Secondary | ICD-10-CM

## 2022-09-11 DIAGNOSIS — J189 Pneumonia, unspecified organism: Secondary | ICD-10-CM

## 2022-09-11 DIAGNOSIS — E559 Vitamin D deficiency, unspecified: Secondary | ICD-10-CM | POA: Diagnosis not present

## 2022-09-11 DIAGNOSIS — Z86711 Personal history of pulmonary embolism: Secondary | ICD-10-CM | POA: Diagnosis not present

## 2022-09-11 DIAGNOSIS — I10 Essential (primary) hypertension: Secondary | ICD-10-CM | POA: Diagnosis not present

## 2022-09-11 DIAGNOSIS — I7121 Aneurysm of the ascending aorta, without rupture: Secondary | ICD-10-CM | POA: Diagnosis not present

## 2022-09-11 NOTE — Patient Instructions (Addendum)
Thank you for visiting Dr. Tonia Brooms at [facility]. Today we recommend the following:  Orders Placed This Encounter  Procedures   Pulmonary Function Test   Regional allergy panel IgE labs   Return in about 6 weeks (around 10/23/2022) for with Rubye Oaks, NP, and Dr. Tonia Brooms, after PFTs.    Please do your part to reduce the spread of COVID-19.

## 2022-09-11 NOTE — Progress Notes (Signed)
Synopsis: Referred in April 2024 for shortness of breath by Fatima Sanger, [name]  Subjective:   PATIENT ID: [name] GENDER: female DOB: 02/12/1941, MRN: [id number]  Chief Complaint  Patient presents with   Follow-up    SOB, wheezing, cough.     This is a 82 year old female, past medical history of DVT, COPD, lung abscess treated with antibiotics.  She had a hospitalization in which she was treated with antimicrobials.  She has had follow-up chest x-rays.  She is here today complaints of shortness of breath.  She still has dyspnea on exertion.  She quit smoking several years ago.  She is currently on triple therapy inhaler regimen.  She has not had follow-up CT imaging.    Past Medical History:  Diagnosis Date   Allergic rhinitis    Anemia due to GI blood loss    Anxiety    Asthma    Chronic airway obstruction, not elsewhere classified    Depressive disorder, not elsewhere classified    DVT (deep venous thrombosis)    LLE DVT 01/13/19   Dyspnea    Esophageal reflux    Essential hypertension 08/27/2019   PE (pulmonary thromboembolism) 01/29/2019   Pneumonia    Wears partial dentures      Family History  Problem Relation Age of Onset   Diabetes Mother    Coronary artery disease Mother    Emphysema Father    Coronary artery disease Brother      Past Surgical History:  Procedure Laterality Date   ABDOMINAL HYSTERECTOMY     ABDOMINAL HYSTERECTOMY     BRONCHIAL WASHINGS  01/24/2022   Procedure: BRONCHIAL WASHINGS;  Surgeon: Lupita Leash, MD;  Location: [facility];  Service: Cardiopulmonary;;   CATARACT EXTRACTION W/ INTRAOCULAR LENS  IMPLANT, BILATERAL     CHOLECYSTECTOMY N/A 08/12/2021   Procedure: LAPAROSCOPIC CHOLECYSTECTOMY;  Surgeon: Quentin Ore, MD;  [facility];  Service: General;  Laterality: N/A;   COLONOSCOPY     COLONOSCOPY WITH PROPOFOL N/A 08/20/2019   Procedure: COLONOSCOPY WITH PROPOFOL;  Surgeon: Bernette Redbird, MD;   Location: [facility];  Service: Endoscopy;  Laterality: N/A;   CORONARY ULTRASOUND/IVUS Left 05/26/2019   Procedure: Intravascular Ultrasound/IVUS;  Surgeon: Maeola Harman, MD;  Location: [facility] INVASIVE CV LAB;  Service: Cardiovascular;  Laterality: Left;   CYSTOSCOPY     DILATION AND CURETTAGE OF UTERUS     ECTROPION REPAIR Bilateral 05/12/2021   Procedure: REPAIR OF ECTROPION BY LATERAL TARSAL STRIP OF BILATERAL LOWER EYE LIDS;  Surgeon: Dairl Ponder, MD;  Location: [facility] OR;  Service: Ophthalmology;  Laterality: Bilateral;   ESOPHAGOGASTRODUODENOSCOPY (EGD) WITH PROPOFOL N/A 08/20/2019   Procedure: ESOPHAGOGASTRODUODENOSCOPY (EGD) WITH PROPOFOL;  Surgeon: Bernette Redbird, MD;  Location: [facility] ENDOSCOPY;  Service: Endoscopy;  Laterality: N/A;   HEMOSTASIS CONTROL  08/20/2019   Procedure: HEMOSTASIS CONTROL;  Surgeon: Bernette Redbird, MD;  Location: [facility] ENDOSCOPY;  Service: Endoscopy;;   IVC FILTER REMOVAL N/A 03/24/2019   Procedure: IVC FILTER REMOVAL;  Surgeon: Maeola Harman, MD;  Location: [facility] INVASIVE CV LAB;  Service: Cardiovascular;  Laterality: N/A;   LACRIMAL TUBE INSERTION Right 05/12/2021   Procedure: RIGHT NASOLACRIMAL DUCT PROBE WITH MINI MONOKA STENT PLACEMENT;  Surgeon: Dairl Ponder, MD;  Location: [facility];  Service: Ophthalmology;  Laterality: Right;   LESION EXCISION WITH COMPLEX REPAIR N/A 05/12/2021   Procedure: NASAL LESION EXCISION;  Surgeon: Dairl Ponder, MD;  Location: [facility] OR;  Service: Ophthalmology;  Laterality: N/A;  LOWER EXTREMITY VENOGRAPHY Left 05/26/2019   Procedure: LOWER EXTREMITY VENOGRAPHY;  Surgeon: Maeola Harman, MD;  Location: [facility] INVASIVE CV LAB;  Service: Cardiovascular;  Laterality: Left;   MULTIPLE TOOTH EXTRACTIONS     NASAL SINUS SURGERY     PERIPHERAL VASCULAR INTERVENTION Left 05/26/2019   Procedure: PERIPHERAL VASCULAR INTERVENTION;  Surgeon: Maeola Harman, MD;  Location: [facility] INVASIVE CV LAB;  Service:  Cardiovascular;  Laterality: Left;  lower extremity veins   PERIPHERAL VASCULAR THROMBECTOMY  05/26/2019   Procedure: PERIPHERAL VASCULAR THROMBECTOMY;  Surgeon: Maeola Harman, MD;  Location: [facility] INVASIVE CV LAB;  Service: Cardiovascular;;   PTOSIS REPAIR Bilateral 05/12/2021   Procedure: EXTERNAL PTOSIS REPAIR LEVATOR ADVANCEDMENT/RESECTION OF BILATERAL UPPER EYE LIDS;  Surgeon: Dairl Ponder, MD;  Location: [facility] OR;  Service: Ophthalmology;  Laterality: Bilateral;   TUBAL LIGATION     VENA CAVA FILTER PLACEMENT N/A 01/13/2019   Procedure: INSERTION VENA-CAVA FILTER;  Surgeon: Maeola Harman, MD;  Location: [facility] OR;  Service: Vascular;  Laterality: N/A;   VIDEO BRONCHOSCOPY N/A 01/24/2022   Procedure: VIDEO BRONCHOSCOPY WITHOUT FLUORO;  Surgeon: Lupita Leash, MD;  Location: [facility] ENDOSCOPY;  Service: Cardiopulmonary;  Laterality: N/A;    Social History   Socioeconomic History   Marital status: Single    Spouse name: Not on file   Number of children: Not on file   Years of education: Not on file   Highest education level: Not on file  Occupational History   Occupation: Disabled  Tobacco Use   Smoking status: Former    Packs/day: 0.25    Years: 40.00    Additional pack years: 0.00    Total pack years: 10.00    Types: Cigarettes    Quit date: 2000    Years since quitting: 24.3   Smokeless tobacco: Never  Vaping Use   Vaping Use: Never used  Substance and Sexual Activity   Alcohol use: No   Drug use: No   Sexual activity: Not on file  Other Topics Concern   Not on file  Social History Narrative   Not on file   Social Determinants of Health   Financial Resource Strain: Not on file  Food Insecurity: No Food Insecurity (02/13/2022)   Hunger Vital Sign    Worried About Running Out of Food in the Last Year: Never true    Ran Out of Food in the Last Year: Never true  Transportation Needs: No Transportation Needs (02/13/2022)   PRAPARE - Therapist, art (Medical): No    Lack of Transportation (Non-Medical): No  Physical Activity: Not on file  Stress: Not on file  Social Connections: Not on file  Intimate Partner Violence: Not At Risk (02/13/2022)   Humiliation, Afraid, Rape, and Kick questionnaire    Fear of Current or Ex-Partner: No    Emotionally Abused: No    Physically Abused: No    Sexually Abused: No     Allergies  Allergen Reactions   Pneumococcal Vaccines Shortness Of Breath and Other (See Comments)    Couldn't breathe    Clarithromycin Swelling   Estradiol Rash    Reaction to cream   Sulfa Antibiotics Nausea And Vomiting and Swelling   Doxycycline Hyclate Other (See Comments)    stomach distress   Pneumococcal Polysaccharide Vaccine Other (See Comments)    Unknown   Sulfacetamide Sodium-Sulfur Other (See Comments)   Sulfonamide Derivatives Swelling     Outpatient Medications Prior to Visit  Medication Sig Dispense Refill   acetaminophen (TYLENOL) 500 MG tablet Take 500 mg by mouth every 6 (six) hours as needed for headache (pain).     albuterol (PROVENTIL) (2.5 MG/3ML) 0.083% nebulizer solution Take 3 mLs (2.5 mg total) by nebulization every 6 (six) hours as needed for wheezing or shortness of breath. 75 mL 12   albuterol (VENTOLIN HFA) 108 (90 Base) MCG/ACT inhaler Inhale 1-2 puffs into the lungs every 6 (six) hours as needed for wheezing or shortness of breath.     APIXABAN (ELIQUIS) VTE STARTER PACK (  AND ) Take as directed on package: start with two-5mg  tablets twice daily for 7 days. On day 8, switch to one-5mg  tablet twice daily. 74 each 0   Budeson-Glycopyrrol-Formoterol (BREZTRI AEROSPHERE) 160-9-4.8 MCG/ACT AERO Inhale 2 puffs into the lungs in the morning and at bedtime. 10.7 g 2   escitalopram (LEXAPRO) 10 MG tablet Take 1 tablet (10 mg total) by mouth daily. 30 tablet 2   furosemide (LASIX) 20 MG tablet Take 20 mg by mouth daily. Takes when having swelling.     LORazepam  (ATIVAN) 1 MG tablet Take 1 mg by mouth at bedtime.     omeprazole (PRILOSEC) 20 MG capsule Take 20 mg by mouth daily.     traZODone (DESYREL) 100 MG tablet Take 100 mg by mouth at bedtime.     triamterene-hydrochlorothiazide (MAXZIDE-25) 37.5-25 MG tablet Take 0.5 tablets by mouth daily.     No facility-administered medications prior to visit.    Review of Systems  Constitutional:  Negative for chills, fever, malaise/fatigue and weight loss.  HENT:  Negative for hearing loss, sore throat and tinnitus.   Eyes:  Negative for blurred vision and double vision.  Respiratory:  Positive for cough and shortness of breath. Negative for hemoptysis, sputum production, wheezing and stridor.   Cardiovascular:  Negative for chest pain, palpitations, orthopnea, leg swelling and PND.  Gastrointestinal:  Negative for abdominal pain, constipation, diarrhea, heartburn, nausea and vomiting.  Genitourinary:  Negative for dysuria, hematuria and urgency.  Musculoskeletal:  Negative for joint pain and myalgias.  Skin:  Negative for itching and rash.  Neurological:  Negative for dizziness, tingling, weakness and headaches.  Endo/Heme/Allergies:  Negative for environmental allergies. Does not bruise/bleed easily.  Psychiatric/Behavioral:  Negative for depression. The patient is not nervous/anxious and does not have insomnia.   All other systems reviewed and are negative.    Objective:  Physical Exam Vitals reviewed.  Constitutional:      General: She is not in acute distress.    Appearance: She is well-developed.  HENT:     Head: Normocephalic and atraumatic.  Eyes:     General: No scleral icterus.    Conjunctiva/sclera: Conjunctivae normal.     Pupils: Pupils are equal, round, and reactive to light.  Neck:     Vascular: No JVD.     Trachea: No tracheal deviation.  Cardiovascular:     Rate and Rhythm: Normal rate and regular rhythm.     Heart sounds: Normal heart sounds. No murmur heard. Pulmonary:      Effort: Pulmonary effort is normal. No tachypnea, accessory muscle usage or respiratory distress.     Breath sounds: No stridor. No wheezing, rhonchi or rales.     Comments: Diminished breath sounds bilaterally Abdominal:     General: There is no distension.     Palpations: Abdomen is soft.     Tenderness: There is no abdominal tenderness.  Musculoskeletal:  General: No tenderness.     Cervical back: Neck supple.  Lymphadenopathy:     Cervical: No cervical adenopathy.  Skin:    General: Skin is warm and dry.     Capillary Refill: Capillary refill takes less than 2 seconds.     Findings: No rash.  Neurological:     Mental Status: She is alert and oriented to person, place, and time.  Psychiatric:        Behavior: Behavior normal.      Vitals:   09/11/22 1028  BP: (!) 140/80  Pulse: 64  SpO2: 91%  Weight: 167 lb 9.6 oz (76 kg)  Height: 5\' 6"  (1.676 m)   91% on RA BMI Readings from Last 3 Encounters:  09/11/22 27.05 kg/m  08/22/22 27.34 kg/m  06/14/22 25.50 kg/m   Wt Readings from Last 3 Encounters:  09/11/22 167 lb 9.6 oz (76 kg)  08/22/22 169 lb 6.4 oz (76.8 kg)  06/14/22 158 lb (71.7 kg)     CBC    Component Value Date/Time   WBC 10.5 05/28/2022 1829   RBC 4.33 05/28/2022 1829   HGB 12.1 05/28/2022 1829   HCT 40.6 05/28/2022 1829   PLT 521 (H) 05/28/2022 1829   MCV 93.8 05/28/2022 1829   MCH 27.9 05/28/2022 1829   MCHC 29.8 (L) 05/28/2022 1829   RDW 13.7 05/28/2022 1829   LYMPHSABS 1.1 05/28/2022 1829   MONOABS 0.9 05/28/2022 1829   EOSABS 0.1 05/28/2022 1829   BASOSABS 0.1 05/28/2022 1829    Chest Imaging:  Chest x-ray 08/22/2022: No active pulmonary disease no infiltrate. The patient's images have been independently reviewed by me.    Pulmonary Functions Testing Results:     No data to display          FeNO:   Pathology:   Echocardiogram:   Heart Catheterization:     Assessment & Plan:     ICD-10-CM   1. COPD mixed  type  J44.9     2. Cavitary pneumonia  J18.9    J98.4     3. Cough, unspecified type  R05.9     4. Recurrent pulmonary embolism  I26.99     5. Chronic diastolic heart failure  I50.32       Discussion:  This is a 82 year old female follow-up today for history of a cavitary pneumonia, to me it looks like there was fluid trapped within her fissure line.  She was treated with antibiotics and improved chest x-ray improved.  She still having ongoing shortness of breath.  She has a history of recurrent pulmonary embolism on anticoagulation.  Plan: Continue triple therapy inhaler regimen Continue Eliquis She needs pulmonary function test. Repeat noncontrasted CT chest. Try to evaluate for potential underlying etiology of worsening dyspnea. Pending on her DLCO may need to consider evaluation for pulmonary hypertension. She does have right ventricular systolic function that is severely reduced on previous echo. She has not had heart catheterization in the past.  May need to consider perfusion imaging due to her history of recurrent PE.  RTC in 6 weeks with TP, NP after PFTs complete.    Current Outpatient Medications:    acetaminophen (TYLENOL) 500 MG tablet, Take 500 mg by mouth every 6 (six) hours as needed for headache (pain)., Disp: , Rfl:    albuterol (PROVENTIL) (2.5 MG/3ML) 0.083% nebulizer solution, Take 3 mLs (2.5 mg total) by nebulization every 6 (six) hours as needed for wheezing or shortness of breath., Disp: 75 mL,  Rfl: 12   albuterol (VENTOLIN HFA) 108 (90 Base) MCG/ACT inhaler, Inhale 1-2 puffs into the lungs every 6 (six) hours as needed for wheezing or shortness of breath., Disp: , Rfl:    APIXABAN (ELIQUIS) VTE STARTER PACK (10MG  AND 5MG ), Take as directed on package: start with two-5mg  tablets twice daily for 7 days. On day 8, switch to one-5mg  tablet twice daily., Disp: 74 each, Rfl: 0   Budeson-Glycopyrrol-Formoterol (BREZTRI AEROSPHERE) 160-9-4.8 MCG/ACT AERO, Inhale  2 puffs into the lungs in the morning and at bedtime., Disp: 10.7 g, Rfl: 2   escitalopram (LEXAPRO) 10 MG tablet, Take 1 tablet (10 mg total) by mouth daily., Disp: 30 tablet, Rfl: 2   furosemide (LASIX) 20 MG tablet, Take 20 mg by mouth daily. Takes when having swelling., Disp: , Rfl:    LORazepam (ATIVAN) 1 MG tablet, Take 1 mg by mouth at bedtime., Disp: , Rfl:    omeprazole (PRILOSEC) 20 MG capsule, Take 20 mg by mouth daily., Disp: , Rfl:    traZODone (DESYREL) 100 MG tablet, Take 100 mg by mouth at bedtime., Disp: , Rfl:    triamterene-hydrochlorothiazide (MAXZIDE-25) 37.5-25 MG tablet, Take 0.5 tablets by mouth daily., Disp: , Rfl:    Josephine Igo, DO [facility] 09/11/2022 10:59 AM

## 2022-09-12 ENCOUNTER — Encounter: Payer: Self-pay | Admitting: Registered Nurse

## 2022-09-12 LAB — IGE: IgE (Immunoglobulin E), Serum: 12 kU/L (ref <=114)

## 2022-09-16 LAB — REGIONAL PANEL 2
012-IgE Goldenrod: <0.1 kU/L
Amer Sycamore IgE Qn: <0.1 kU/L
Bahia Grass IgE: <0.1 kU/L
Bermuda Grass IgE: <0.1 kU/L
Cocklebur IgE: <0.1 kU/L
Cottonwood IgE: <0.1 kU/L
Elm, American IgE: <0.1 kU/L
Hickory, White IgE: <0.1 kU/L
Johnson Grass IgE: <0.1 kU/L
Kentucky Bluegrass IgE: <0.1 kU/L
Lamb's Quarters IgE: <0.1 kU/L
Maple/Box Elder IgE: <0.1 kU/L
Oak, White IgE: <0.1 kU/L
Pigweed, Rough IgE: <0.1 kU/L
Plantain, English IgE: <0.1 kU/L
Ragweed, Short IgE: <0.1 kU/L
T005-IgE Beech (American): <0.1 kU/L
T010-IgE Walnut: <0.1 kU/L
T012-IgE Willow: <0.1 kU/L
T015-IgE Ash, White: <0.1 kU/L
W004-IgE Ragweed, False: <0.1 kU/L

## 2022-09-18 NOTE — Progress Notes (Signed)
FYI, follow up plans. Seeing you in a few weeks after PFTs  RAST + IgE neg   Thanks,  BLI  Josephine Igo, DO [facility] 09/18/2022 8:03 AM

## 2022-09-20 ENCOUNTER — Ambulatory Visit: Payer: PPO | Admitting: Student

## 2022-09-20 ENCOUNTER — Other Ambulatory Visit: Payer: Self-pay | Admitting: *Deleted

## 2022-09-20 ENCOUNTER — Encounter: Payer: Self-pay | Admitting: Student

## 2022-09-20 ENCOUNTER — Ambulatory Visit (INDEPENDENT_AMBULATORY_CARE_PROVIDER_SITE_OTHER): Payer: PPO

## 2022-09-20 VITALS — BP 118/76 | HR 109 | Temp 98.9°F | Ht 66.0 in | Wt 166.6 lb

## 2022-09-20 DIAGNOSIS — J441 Chronic obstructive pulmonary disease with (acute) exacerbation: Secondary | ICD-10-CM

## 2022-09-20 DIAGNOSIS — R0609 Other forms of dyspnea: Secondary | ICD-10-CM

## 2022-09-20 DIAGNOSIS — R059 Cough, unspecified: Secondary | ICD-10-CM

## 2022-09-20 DIAGNOSIS — J449 Chronic obstructive pulmonary disease, unspecified: Secondary | ICD-10-CM

## 2022-09-20 DIAGNOSIS — J9621 Acute and chronic respiratory failure with hypoxia: Secondary | ICD-10-CM

## 2022-09-20 DIAGNOSIS — R0600 Dyspnea, unspecified: Secondary | ICD-10-CM | POA: Diagnosis not present

## 2022-09-20 MED ORDER — PREDNISONE 10 MG PO TABS: ORAL_TABLET | ORAL | 0 refills | Status: DC

## 2022-09-20 MED ORDER — METHYLPREDNISOLONE ACETATE 80 MG/ML IJ SUSP
80.0000 mg | Freq: Once | INTRAMUSCULAR | Status: AC
  Administered 2022-09-20: 80 mg via INTRAMUSCULAR

## 2022-09-20 MED ORDER — AMOXICILLIN-POT CLAVULANATE 875-125 MG PO TABS: 1.0000 | ORAL_TABLET | Freq: Two times a day (BID) | ORAL | 0 refills | Status: DC

## 2022-09-20 NOTE — Patient Instructions (Signed)
-   x ray and swab today - augmentin and prednisone taper - guaifenesin 600 mg-1200mg  twice daily while you have productive cough - breztri 2 puff bid with spacer rinse mouth and brush teeth/tongue after each use - after each breztri treatment flutter valve 10 slow but firm puffs twice daily  - if no better and still needing O2 in 3-4 days, call us - you should probably head to the hospital ED

## 2022-09-20 NOTE — Progress Notes (Signed)
Synopsis: Referred for dyspnea by Fatima Sanger, [name]  Subjective:   PATIENT ID: [name] GENDER: female DOB: [birth year], MRN: [id number]  Chief Complaint  Patient presents with   Acute Visit    Increased SOB over the past wks. She has had prod cough with yellow sputum over the past couple days and also body aches. She is using her albuterol 3-4 x per day.    81yF with history of cavitary pneumonia, recurrent PE on eliquis, COPD/severe airway obstruction on spiro 2018, chronic hypoxemic respiratory failure on 0 to 2L, seen today for acute dyspnea, cough  Over last week or two has had to increase O2 to 3-4L. Uses tanks with [employer].  Cough is productive now. Dyspnea much worse. No fever, hemoptysis.   Otherwise pertinent review of systems is negative.  Past Medical History:  Diagnosis Date   Allergic rhinitis    Anemia due to GI blood loss    Anxiety    Asthma    Chronic airway obstruction, not elsewhere classified    Depressive disorder, not elsewhere classified    DVT (deep venous thrombosis)    LLE DVT 01/13/19   Dyspnea    Esophageal reflux    Essential hypertension 08/27/2019   PE (pulmonary thromboembolism) 01/29/2019   Pneumonia    Wears partial dentures      Family History  Problem Relation Age of Onset   Diabetes Mother    Coronary artery disease Mother    Emphysema Father    Coronary artery disease Brother      Past Surgical History:  Procedure Laterality Date   ABDOMINAL HYSTERECTOMY     ABDOMINAL HYSTERECTOMY     BRONCHIAL WASHINGS  01/24/2022   Procedure: BRONCHIAL WASHINGS;  Surgeon: Lupita Leash, MD;  Location: [facility] ENDOSCOPY;  Service: Cardiopulmonary;;   CATARACT EXTRACTION W/ INTRAOCULAR LENS  IMPLANT, BILATERAL     CHOLECYSTECTOMY N/A 08/12/2021   Procedure: LAPAROSCOPIC CHOLECYSTECTOMY;  Surgeon: Quentin Ore, MD;  Location: [facility] ORS;  Service: General;  Laterality: N/A;   COLONOSCOPY     COLONOSCOPY WITH PROPOFOL N/A  08/20/2019   Procedure: COLONOSCOPY WITH PROPOFOL;  Surgeon: Bernette Redbird, MD;  Location: [facility] ENDOSCOPY;  Service: Endoscopy;  Laterality: N/A;   CORONARY ULTRASOUND/IVUS Left 05/26/2019   Procedure: Intravascular Ultrasound/IVUS;  Surgeon: Maeola Harman, MD;  Location: [facility] INVASIVE CV LAB;  Service: Cardiovascular;  Laterality: Left;   CYSTOSCOPY     DILATION AND CURETTAGE OF UTERUS     ECTROPION REPAIR Bilateral 05/12/2021   Procedure: REPAIR OF ECTROPION BY LATERAL TARSAL STRIP OF BILATERAL LOWER EYE LIDS;  Surgeon: Dairl Ponder, MD;  Location: [facility] OR;  Service: Ophthalmology;  Laterality: Bilateral;   ESOPHAGOGASTRODUODENOSCOPY (EGD) WITH PROPOFOL N/A 08/20/2019   Procedure: ESOPHAGOGASTRODUODENOSCOPY (EGD) WITH PROPOFOL;  Surgeon: Bernette Redbird, MD;  Location: [facility] ENDOSCOPY;  Service: Endoscopy;  Laterality: N/A;   HEMOSTASIS CONTROL  08/20/2019   Procedure: HEMOSTASIS CONTROL;  Surgeon: Bernette Redbird, MD;  Location: [facility] ENDOSCOPY;  Service: Endoscopy;;   IVC FILTER REMOVAL N/A 03/24/2019   Procedure: IVC FILTER REMOVAL;  Surgeon: Maeola Harman, MD;  Location: [facility] INVASIVE CV LAB;  Service: Cardiovascular;  Laterality: N/A;   LACRIMAL TUBE INSERTION Right 05/12/2021   Procedure: RIGHT NASOLACRIMAL DUCT PROBE WITH MINI MONOKA STENT PLACEMENT;  Surgeon: Dairl Ponder, MD;  Location: [facility] OR;  Service: Ophthalmology;  Laterality: Right;   LESION EXCISION WITH COMPLEX REPAIR N/A 05/12/2021   Procedure: NASAL LESION EXCISION;  Surgeon: Dairl Ponder,  MD;  Location: [facility] OR;  Service: Ophthalmology;  Laterality: N/A;   LOWER EXTREMITY VENOGRAPHY Left 05/26/2019   Procedure: LOWER EXTREMITY VENOGRAPHY;  Surgeon: Maeola Harman, MD;  Location: [facility] INVASIVE CV LAB;  Service: Cardiovascular;  Laterality: Left;   MULTIPLE TOOTH EXTRACTIONS     NASAL SINUS SURGERY     PERIPHERAL VASCULAR INTERVENTION Left 05/26/2019   Procedure: PERIPHERAL VASCULAR INTERVENTION;   Surgeon: Maeola Harman, MD;  Location: [facility] INVASIVE CV LAB;  Service: Cardiovascular;  Laterality: Left;  lower extremity veins   PERIPHERAL VASCULAR THROMBECTOMY  05/26/2019   Procedure: PERIPHERAL VASCULAR THROMBECTOMY;  Surgeon: Maeola Harman, MD;  Location: [facility] INVASIVE CV LAB;  Service: Cardiovascular;;   PTOSIS REPAIR Bilateral 05/12/2021   Procedure: EXTERNAL PTOSIS REPAIR LEVATOR ADVANCEDMENT/RESECTION OF BILATERAL UPPER EYE LIDS;  Surgeon: Dairl Ponder, MD;  Location: [facility] OR;  Service: Ophthalmology;  Laterality: Bilateral;   TUBAL LIGATION     VENA CAVA FILTER PLACEMENT N/A 01/13/2019   Procedure: INSERTION VENA-CAVA FILTER;  Surgeon: Maeola Harman, MD;  Location: [facility] OR;  Service: Vascular;  Laterality: N/A;   VIDEO BRONCHOSCOPY N/A 01/24/2022   Procedure: VIDEO BRONCHOSCOPY WITHOUT FLUORO;  Surgeon: Lupita Leash, MD;  Location: [facility] ENDOSCOPY;  Service: Cardiopulmonary;  Laterality: N/A;    Social History   Socioeconomic History   Marital status: Single    Spouse name: Not on file   Number of children: Not on file   Years of education: Not on file   Highest education level: Not on file  Occupational History   Occupation: Disabled  Tobacco Use   Smoking status: Former    Packs/day: 0.25    Years: 40.00    Additional pack years: 0.00    Total pack years: 10.00    Types: Cigarettes    Quit date: 2000    Years since quitting: 24.3   Smokeless tobacco: Never  Vaping Use   Vaping Use: Never used  Substance and Sexual Activity   Alcohol use: No   Drug use: No   Sexual activity: Not on file  Other Topics Concern   Not on file  Social History Narrative   Not on file   Social Determinants of Health   Financial Resource Strain: Not on file  Food Insecurity: No Food Insecurity (02/13/2022)   Hunger Vital Sign    Worried About Running Out of Food in the Last Year: Never true    Ran Out of Food in the Last Year: Never true   Transportation Needs: No Transportation Needs (02/13/2022)   PRAPARE - Administrator, Civil Service (Medical): No    Lack of Transportation (Non-Medical): No  Physical Activity: Not on file  Stress: Not on file  Social Connections: Not on file  Intimate Partner Violence: Not At Risk (02/13/2022)   Humiliation, Afraid, Rape, and Kick questionnaire    Fear of Current or Ex-Partner: No    Emotionally Abused: No    Physically Abused: No    Sexually Abused: No     Allergies  Allergen Reactions   Pneumococcal Vaccines Shortness Of Breath and Other (See Comments)    Couldn't breathe    Clarithromycin Swelling   Estradiol Rash    Reaction to cream   Sulfa Antibiotics Nausea And Vomiting and Swelling   Doxycycline Hyclate Other (See Comments)    stomach distress   Pneumococcal Polysaccharide Vaccine Other (See Comments)    Unknown   Sulfacetamide Sodium-Sulfur Other (See Comments)  Sulfonamide Derivatives Swelling     Outpatient Medications Prior to Visit  Medication Sig Dispense Refill   acetaminophen (TYLENOL) 500 MG tablet Take 500 mg by mouth every 6 (six) hours as needed for headache (pain).     albuterol (PROVENTIL) (2.5 MG/3ML) 0.083% nebulizer solution Take 3 mLs (2.5 mg total) by nebulization every 6 (six) hours as needed for wheezing or shortness of breath. 75 mL 12   albuterol (VENTOLIN HFA) 108 (90 Base) MCG/ACT inhaler Inhale 1-2 puffs into the lungs every 6 (six) hours as needed for wheezing or shortness of breath.     apixaban (ELIQUIS) 5 MG TABS tablet Take 5 mg by mouth 2 (two) times daily.     Budeson-Glycopyrrol-Formoterol (BREZTRI AEROSPHERE) 160-9-4.8 MCG/ACT AERO Inhale 2 puffs into the lungs in the morning and at bedtime. 10.7 g 2   escitalopram (LEXAPRO) 10 MG tablet Take 1 tablet (10 mg total) by mouth daily. 30 tablet 2   furosemide (LASIX) 20 MG tablet Take 20 mg by mouth daily. Takes when having swelling.     LORazepam (ATIVAN) 1 MG tablet Take  1 mg by mouth at bedtime.     omeprazole (PRILOSEC) 20 MG capsule Take 20 mg by mouth daily.     traZODone (DESYREL) 100 MG tablet Take 100 mg by mouth at bedtime.     triamterene-hydrochlorothiazide (MAXZIDE-25) 37.5-25 MG tablet Take 0.5 tablets by mouth daily.     APIXABAN (ELIQUIS) VTE STARTER PACK (  AND ) Take as directed on package: start with two-5mg  tablets twice daily for 7 days. On day 8, switch to one-5mg  tablet twice daily. 74 each 0   No facility-administered medications prior to visit.       Objective:   Physical Exam:  General appearance: 83 y.o., female, NAD, conversant, chronically ill appearing Eyes: anicteric sclerae; PERRL, tracking appropriately HENT: NCAT; MMM Neck: Trachea midline; no lymphadenopathy, no JVD Lungs: wheeze and rhonchi bl, mildly increased respiratory effort able to speak full sentences CV: RRR, no murmur  Abdomen: Soft, non-tender; non-distended, BS present  Extremities: No peripheral edema, warm Skin: Normal turgor and texture; no rash Psych: Appropriate affect Neuro: Alert and oriented to person and place, no focal deficit     Vitals:   09/20/22 0915  BP: 118/76  Pulse: (!) 109  Temp: 98.9 F (37.2 C)  TempSrc: Oral  SpO2: 93%  Weight: 166 lb 9.6 oz (75.6 kg)  Height:  (1.676 m)   93% on 4 LPM  BMI Readings from Last 3 Encounters:  09/20/22 26.89 kg/m  09/11/22 27.05 kg/m  08/22/22 27.34 kg/m   Wt Readings from Last 3 Encounters:  09/20/22 166 lb 9.6 oz (75.6 kg)  09/11/22 167 lb 9.6 oz (76 kg)  08/22/22 169 lb 6.4 oz (76.8 kg)     CBC    Component Value Date/Time   WBC 10.5 05/28/2022 1829   RBC 4.33 05/28/2022 1829   HGB 12.1 05/28/2022 1829   HCT 40.6 05/28/2022 1829   PLT 521 (H) 05/28/2022 1829   MCV 93.8 05/28/2022 1829   MCH 27.9 05/28/2022 1829   MCHC 29.8 (L) 05/28/2022 1829   RDW 13.7 05/28/2022 1829   LYMPHSABS 1.1 05/28/2022 1829   MONOABS 0.9 05/28/2022 1829   EOSABS 0.1 05/28/2022  1829   BASOSABS 0.1 05/28/2022 1829    Doesn't historically have significant eosinophilia  Chest Imaging: CXR 3/26 without lobar pneumonia or clear evidence of persistent cavitary lesion  Pulmonary Functions Testing Results:  No data to display             Assessment & Plan:   # acute exacerbation of COPD # COPD gold E, severe obstructive defect # acute on chronic hypoxic respiratory failure  Plan: - x ray and swab today - augmentin and prednisone taper - guaifenesin 600 mg-1200mg  twice daily while you have productive cough - breztri 2 puff bid with spacer rinse mouth and brush teeth/tongue after each use - after each breztri treatment flutter valve 10 slow but firm puffs twice daily  - if no better and still needing O2 in 3-4 days, call us - you should probably head to the hospital ED - has upcoming CT Chest and is already scheduled to see TP 5/28. Given absence of eosinophilia to target, if there's nothing actionable on CT Chest (like more workup of cavitary lesion(s) etc) then may be worth considering long term azithromycin      Omar Person, MD [facility] 09/20/2022 9:29 AM

## 2022-09-21 DIAGNOSIS — J449 Chronic obstructive pulmonary disease, unspecified: Secondary | ICD-10-CM | POA: Diagnosis not present

## 2022-09-22 LAB — COVID-19, FLU A+B AND RSV
Influenza A, NAA: NOT DETECTED
Influenza B, NAA: NOT DETECTED
RSV, NAA: NOT DETECTED
SARS-CoV-2, NAA: NOT DETECTED

## 2022-09-25 ENCOUNTER — Other Ambulatory Visit: Payer: Self-pay

## 2022-09-25 ENCOUNTER — Inpatient Hospital Stay (HOSPITAL_COMMUNITY)
Admission: EM | Admit: 2022-09-25 | Discharge: 2022-09-29 | DRG: 190 | Disposition: A | Discharge status: Home / Self Care | Payer: PPO | Attending: Internal Medicine | Admitting: Internal Medicine

## 2022-09-25 ENCOUNTER — Emergency Department (HOSPITAL_COMMUNITY): Payer: PPO

## 2022-09-25 ENCOUNTER — Encounter (HOSPITAL_COMMUNITY): Payer: Self-pay

## 2022-09-25 ENCOUNTER — Telehealth: Payer: Self-pay

## 2022-09-25 DIAGNOSIS — Z79899 Other long term (current) drug therapy: Secondary | ICD-10-CM | POA: Diagnosis not present

## 2022-09-25 DIAGNOSIS — Z825 Family history of asthma and other chronic lower respiratory diseases: Secondary | ICD-10-CM | POA: Diagnosis not present

## 2022-09-25 DIAGNOSIS — Z86711 Personal history of pulmonary embolism: Secondary | ICD-10-CM | POA: Diagnosis not present

## 2022-09-25 DIAGNOSIS — Z8249 Family history of ischemic heart disease and other diseases of the circulatory system: Secondary | ICD-10-CM | POA: Diagnosis not present

## 2022-09-25 DIAGNOSIS — J432 Centrilobular emphysema: Secondary | ICD-10-CM | POA: Diagnosis not present

## 2022-09-25 DIAGNOSIS — I10 Essential (primary) hypertension: Secondary | ICD-10-CM | POA: Diagnosis not present

## 2022-09-25 DIAGNOSIS — R9389 Abnormal findings on diagnostic imaging of other specified body structures: Secondary | ICD-10-CM

## 2022-09-25 DIAGNOSIS — Z1152 Encounter for screening for COVID-19: Secondary | ICD-10-CM | POA: Diagnosis not present

## 2022-09-25 DIAGNOSIS — R911 Solitary pulmonary nodule: Secondary | ICD-10-CM | POA: Diagnosis present

## 2022-09-25 DIAGNOSIS — F32A Depression, unspecified: Secondary | ICD-10-CM | POA: Diagnosis present

## 2022-09-25 DIAGNOSIS — Q969 Turner's syndrome, unspecified: Secondary | ICD-10-CM

## 2022-09-25 DIAGNOSIS — Z881 Allergy status to other antibiotic agents status: Secondary | ICD-10-CM

## 2022-09-25 DIAGNOSIS — Z86718 Personal history of other venous thrombosis and embolism: Secondary | ICD-10-CM

## 2022-09-25 DIAGNOSIS — J852 Abscess of lung without pneumonia: Secondary | ICD-10-CM | POA: Diagnosis present

## 2022-09-25 DIAGNOSIS — Z882 Allergy status to sulfonamides status: Secondary | ICD-10-CM | POA: Diagnosis not present

## 2022-09-25 DIAGNOSIS — Z887 Allergy status to serum and vaccine status: Secondary | ICD-10-CM | POA: Diagnosis not present

## 2022-09-25 DIAGNOSIS — K219 Gastro-esophageal reflux disease without esophagitis: Secondary | ICD-10-CM | POA: Diagnosis present

## 2022-09-25 DIAGNOSIS — R0602 Shortness of breath: Secondary | ICD-10-CM | POA: Diagnosis not present

## 2022-09-25 DIAGNOSIS — R0981 Nasal congestion: Secondary | ICD-10-CM | POA: Diagnosis present

## 2022-09-25 DIAGNOSIS — Z87891 Personal history of nicotine dependence: Secondary | ICD-10-CM | POA: Diagnosis not present

## 2022-09-25 DIAGNOSIS — J441 Chronic obstructive pulmonary disease with (acute) exacerbation: Principal | ICD-10-CM | POA: Diagnosis present

## 2022-09-25 DIAGNOSIS — R0600 Dyspnea, unspecified: Secondary | ICD-10-CM | POA: Diagnosis present

## 2022-09-25 DIAGNOSIS — F419 Anxiety disorder, unspecified: Secondary | ICD-10-CM | POA: Diagnosis present

## 2022-09-25 DIAGNOSIS — Z833 Family history of diabetes mellitus: Secondary | ICD-10-CM

## 2022-09-25 DIAGNOSIS — Z8701 Personal history of pneumonia (recurrent): Secondary | ICD-10-CM

## 2022-09-25 DIAGNOSIS — Z7901 Long term (current) use of anticoagulants: Secondary | ICD-10-CM

## 2022-09-25 DIAGNOSIS — Z888 Allergy status to other drugs, medicaments and biological substances status: Secondary | ICD-10-CM

## 2022-09-25 LAB — BASIC METABOLIC PANEL
Anion gap: 10 (ref 5–15)
BUN: 23 mg/dL (ref 8–23)
CO2: 28 mmol/L (ref 22–32)
Calcium: 8.7 mg/dL — ABNORMAL LOW (ref 8.9–10.3)
Chloride: 99 mmol/L (ref 98–111)
Creatinine, Ser: 0.85 mg/dL (ref 0.44–1.00)
GFR, Estimated: >60 mL/min (ref >60)
Glucose, Bld: 99 mg/dL (ref 70–99)
Potassium: 3.6 mmol/L (ref 3.5–5.1)
Sodium: 137 mmol/L (ref 135–145)

## 2022-09-25 LAB — CBC WITH DIFFERENTIAL/PLATELET
Abs Immature Granulocytes: 0.06 10*3/uL (ref 0.00–0.07)
Basophils Absolute: 0 10*3/uL (ref 0.0–0.1)
Basophils Relative: 0 %
Eosinophils Absolute: 0 10*3/uL (ref 0.0–0.5)
Eosinophils Relative: 0 %
HCT: 45 % (ref 36.0–46.0)
Hemoglobin: 13.6 g/dL (ref 12.0–15.0)
Immature Granulocytes: 1 %
Lymphocytes Relative: 10 %
Lymphs Abs: 0.8 10*3/uL (ref 0.7–4.0)
MCH: 26.9 pg (ref 26.0–34.0)
MCHC: 30.2 g/dL (ref 30.0–36.0)
MCV: 88.9 fL (ref 80.0–100.0)
Monocytes Absolute: 0.4 10*3/uL (ref 0.1–1.0)
Monocytes Relative: 5 %
Neutro Abs: 6.8 10*3/uL (ref 1.7–7.7)
Neutrophils Relative %: 84 %
Platelets: 420 10*3/uL — ABNORMAL HIGH (ref 150–400)
RBC: 5.06 MIL/uL (ref 3.87–5.11)
RDW: 14.8 % (ref 11.5–15.5)
WBC: 8 10*3/uL (ref 4.0–10.5)
nRBC: 0 % (ref 0.0–0.2)

## 2022-09-25 LAB — BRAIN NATRIURETIC PEPTIDE: B Natriuretic Peptide: 46.1 pg/mL (ref 0.0–100.0)

## 2022-09-25 MED ORDER — ALBUTEROL SULFATE (2.5 MG/3ML) 0.083% IN NEBU
10.0000 mg/h | INHALATION_SOLUTION | Freq: Once | RESPIRATORY_TRACT | Status: AC
  Administered 2022-09-25: 10 mg/h via RESPIRATORY_TRACT
  Filled 2022-09-25: qty 3
  Filled 2022-09-25: qty 12

## 2022-09-25 MED ORDER — ACETAMINOPHEN 325 MG PO TABS
650.0000 mg | ORAL_TABLET | Freq: Once | ORAL | Status: AC
  Administered 2022-09-25: 650 mg via ORAL
  Filled 2022-09-25: qty 2

## 2022-09-25 MED ORDER — IPRATROPIUM BROMIDE 0.02 % IN SOLN
0.5000 mg | Freq: Once | RESPIRATORY_TRACT | Status: AC
  Administered 2022-09-25: 0.5 mg via RESPIRATORY_TRACT
  Filled 2022-09-25: qty 2.5

## 2022-09-25 MED ORDER — METHYLPREDNISOLONE SODIUM SUCC 125 MG IJ SOLR
125.0000 mg | Freq: Once | INTRAMUSCULAR | Status: AC
  Administered 2022-09-25: 125 mg via INTRAVENOUS
  Filled 2022-09-25: qty 2

## 2022-09-25 MED ORDER — MAGNESIUM SULFATE 2 GM/50ML IV SOLN
2.0000 g | Freq: Once | INTRAVENOUS | Status: AC
  Administered 2022-09-25: 2 g via INTRAVENOUS
  Filled 2022-09-25: qty 50

## 2022-09-25 NOTE — Telephone Encounter (Signed)
Spoke with the pt  She was seen 09/20/22 for acute visit- augmentin and pred rx given  She states that her wheezing and SOB seem to be worse now, which is not usual for her since pred normally helps  She is still coughing up yellow sputum  Has another day left on her abx  She is using her albuterol inhaler and breztry daily  Unfortunately no openings in clinic soon this wk   Dr Thora Lance, can you please advise any additional recommendations?  Allergies  Allergen Reactions   Pneumococcal Vaccines Shortness Of Breath and Other (See Comments)    Couldn't breathe    Clarithromycin Swelling   Estradiol Rash    Reaction to cream   Sulfa Antibiotics Nausea And Vomiting and Swelling   Doxycycline Hyclate Other (See Comments)    stomach distress   Pneumococcal Polysaccharide Vaccine Other (See Comments)    Unknown   Sulfacetamide Sodium-Sulfur Other (See Comments)   Sulfonamide Derivatives Swelling

## 2022-09-25 NOTE — Telephone Encounter (Signed)
PT saw Dr. Thora Lance last week, she said and is still not better. Please call @ [phone]  Pharm: [facility] on [address].

## 2022-09-25 NOTE — ED Provider Notes (Signed)
[place] EMERGENCY DEPARTMENT AT [facility] Provider Note   CSN: [id number] Arrival date & time: 09/25/22  1757     History  Chief Complaint  Patient presents with   Nasal Congestion   Shortness of Breath    [name] is a 82 y.o. female.   Shortness of Breath    Patient has a 61-year-old for allergic rhinitis COPD, reflux, depression, DVT, PE, pneumonia who presents to the ED for evaluation of shortness of breath.  Patient has been having trouble with shortness of breath for couple weeks.  She recently saw her pulmonary doctor for the symptoms 5 days ago.  Patient has been having issues with productive yellow sputum.  She is having to use her albuterol several times per day.  She was started on antibiotics as well as steroids.  She has not noted any improvement.  She called her pulmonary doctor today noting that she was not feeling any better and was having increased her oxygen so they told her to come to the ED.  Patient states she has not had any leg swelling.  No fevers.  She has been having a lot of wheezing.  She feels worse with her breathing.  Home Medications Prior to Admission medications   Medication Sig Start Date End Date Taking? Authorizing Provider  acetaminophen (TYLENOL) 500 MG tablet Take 500 mg by mouth every 6 (six) hours as needed for headache (pain).    [provider]  albuterol (PROVENTIL) (2.5 MG/3ML) 0.083% nebulizer solution Take 3 mLs (2.5 mg total) by nebulization every 6 (six) hours as needed for wheezing or shortness of breath. 02/28/22   Lupita Leash, MD  albuterol (VENTOLIN HFA) 108 (90 Base) MCG/ACT inhaler Inhale 1-2 puffs into the lungs every 6 (six) hours as needed for wheezing or shortness of breath.    [provider]  amoxicillin-clavulanate (AUGMENTIN) 875-125 MG tablet Take 1 tablet by mouth 2 (two) times daily for 7 days. 09/20/22 09/27/22  Omar Person, MD  apixaban (ELIQUIS) 5 MG TABS tablet Take 5  mg by mouth 2 (two) times daily.    [provider]  Budeson-Glycopyrrol-Formoterol (BREZTRI AEROSPHERE) 160-9-4.8 MCG/ACT AERO Inhale 2 puffs into the lungs in the morning and at bedtime. 06/21/22   Lupita Leash, MD  escitalopram (LEXAPRO) 10 MG tablet Take 1 tablet (10 mg total) by mouth daily. 02/17/22 02/17/23  Ghimire, Werner Lean, MD  furosemide (LASIX) 20 MG tablet Take 20 mg by mouth daily. Takes when having swelling.    [provider]  LORazepam (ATIVAN) 1 MG tablet Take 1 mg by mouth at bedtime.    [provider]  omeprazole (PRILOSEC) 20 MG capsule Take 20 mg by mouth daily. 03/26/20   [provider]  predniSONE (DELTASONE) 10 MG tablet Take 4 tabs by mouth for 3 days, then 3 for 3 days, 2 for 3 days, 1 for 3 days and stop 09/20/22   Omar Person, MD  traZODone (DESYREL) 100 MG tablet Take 100 mg by mouth at bedtime.    [provider]  triamterene-hydrochlorothiazide (MAXZIDE-25) 37.5-25 MG tablet Take 0.5 tablets by mouth daily. 02/06/19   [provider]      Allergies    Pneumococcal vaccines, Clarithromycin, Estradiol, Sulfa antibiotics, Doxycycline hyclate, Pneumococcal polysaccharide vaccine, Sulfacetamide sodium-sulfur, and Sulfonamide derivatives    Review of Systems   Review of Systems  Respiratory:  Positive for shortness of breath.     Physical Exam Updated  Vital Signs BP (!) 141/113 (BP Location: Right Arm)   Pulse 77   Temp 98.2 F (36.8 C) (Oral)   Resp 16   Ht 1.676 m (5\' 6" )   Wt 73.9 kg   SpO2 100%   BMI 26.31 kg/m  Physical Exam Vitals and nursing note reviewed.  Constitutional:      General: She is not in acute distress.    Appearance: She is well-developed.  HENT:     Head: Normocephalic and atraumatic.     Right Ear: External ear normal.     Left Ear: External ear normal.  Eyes:     General: No scleral icterus.       Right eye: No discharge.        Left eye: No discharge.      Conjunctiva/sclera: Conjunctivae normal.  Neck:     Trachea: No tracheal deviation.  Cardiovascular:     Rate and Rhythm: Normal rate and regular rhythm.  Pulmonary:     Effort: Pulmonary effort is normal. No respiratory distress.     Breath sounds: No stridor. Wheezing present. No rales.  Abdominal:     General: Bowel sounds are normal. There is no distension.     Palpations: Abdomen is soft.     Tenderness: There is no abdominal tenderness. There is no guarding or rebound.  Musculoskeletal:        General: No tenderness or deformity.     Cervical back: Neck supple.     Right lower leg: No edema.     Left lower leg: No edema.  Skin:    General: Skin is warm and dry.     Findings: No rash.  Neurological:     General: No focal deficit present.     Mental Status: She is alert.     Cranial Nerves: No cranial nerve deficit, dysarthria or facial asymmetry.     Sensory: No sensory deficit.     Motor: No abnormal muscle tone or seizure activity.     Coordination: Coordination normal.  Psychiatric:        Mood and Affect: Mood normal.     ED Results / Procedures / Treatments   Labs (all labs ordered are listed, but only abnormal results are displayed) Labs Reviewed  CBC WITH DIFFERENTIAL/PLATELET - Abnormal; Notable for the following components:      Result Value   Platelets 420 (*)    All other components within normal limits  BASIC METABOLIC PANEL - Abnormal; Notable for the following components:   Calcium 8.7 (*)    All other components within normal limits  BRAIN NATRIURETIC PEPTIDE    EKG EKG Interpretation  Date/Time:  Monday September 25 2022 18:20:00 EDT Ventricular Rate:  98 PR Interval:  142 QRS Duration: 83 QT Interval:  361 QTC Calculation: 461 R Axis:   72 Text Interpretation: Sinus rhythm No significant change since last tracing Confirmed by Linwood Dibbles [phone]) on 09/25/2022 8:05:20 PM  Radiology DG Chest 2 View  Result Date: 09/25/2022 CLINICAL DATA:   Shortness of breath EXAM: CHEST - 2 VIEW COMPARISON:  09/20/2022 FINDINGS: The heart size and mediastinal contours are within normal limits. Both lungs are clear. The visualized skeletal structures are unremarkable. Prominent central pulmonary vessels suggesting arterial hypertension. IMPRESSION: No active cardiopulmonary disease. Prominent central pulmonary vessels suggesting arterial hypertension. Electronically Signed   By: Jasmine Pang M.D.   On: 09/25/2022 20:08    Procedures Procedures    Medications Ordered in ED Medications  magnesium sulfate IVPB 2 g 50 mL (0 g Intravenous Stopped 09/25/22 2235)  methylPREDNISolone sodium succinate (SOLU-MEDROL) 125 mg/2 mL injection 125 mg (125 mg Intravenous Given 09/25/22 2042)  albuterol (PROVENTIL) (2.5 MG/3ML) 0.083% nebulizer solution (10 mg/hr Nebulization Given 09/25/22 2037)  ipratropium (ATROVENT) nebulizer solution 0.5 mg (0.5 mg Nebulization Given 09/25/22 2032)  acetaminophen (TYLENOL) tablet 650 mg (650 mg Oral Given 09/25/22 2056)    ED Course/ Medical Decision Making/ A&P Clinical Course as of 09/25/22 2316  Mon Sep 25, 2022  2151 CBC metabolic panel normal. [JK]  2151 BNP normal [JK]  2152 CXR without acute changes [JK]  2208 Patient still tachypneic with increased work of breathing.  Patient does not feel like she is back at her baseline.  She is already been on outpatient regimen without success.  I will consult with medical service for admission [JK]  2316 Case discussed with Dr Julian Reil regarding admission [JK]    Clinical Course User Index [JK] Linwood Dibbles, MD                             Medical Decision Making Problems Addressed: COPD exacerbation [facility]): acute illness or injury that poses a threat to life or bodily functions  Amount and/or Complexity of Data Reviewed Labs: ordered. Decision-making details documented in ED Course. Radiology: ordered and independent interpretation performed.  Risk OTC  drugs. Prescription drug management. Decision regarding hospitalization.   Patient presented to the ED for evaluation of shortness of breath.  Patient has history of COPD.  Patient is on chronic oxygen at home.  Patient had notable wheezing and increased work of breathing on exam.  Patient had already seen her pulmonologist and has been started on antibiotics as well as breztri without improvement.  Pt has been given albuterol tx, magnesium , steroids.  Will admit for further treatment.        Final Clinical Impression(s) / ED Diagnoses Final diagnoses:  COPD exacerbation [facility])    Rx / DC Orders ED Discharge Orders     None         Linwood Dibbles, MD 09/25/22 2316

## 2022-09-25 NOTE — ED Triage Notes (Signed)
Pulmonologist sent pt for nasal congestion that has been ongoing for 3 weeks. Pt states she has been SOB for 3 weeks, worse with exertion. Pt took course of prednisone and amoxicillin with no changes in sx. Pt breathing is unlabored and symmetrical on assessment. Pt is supposed to wear 3L Julian at home, arrives on RA, is 90% on RA. States her normal oxygen is 88%-92% on 3L.

## 2022-09-25 NOTE — Telephone Encounter (Signed)
Called and spoke with pt letting her know recs per Dr. Thora Lance. Pt said that she has had to bump O2 up even higher so I recommended pt to go to the ED and she verbalized understanding. Nothing further needed.

## 2022-09-25 NOTE — Telephone Encounter (Signed)
Would offer to add z pack but if she's unimproved otherwise my only other recommendation would unfortunately be to head to the ED given her increased oxygen requirement - especially if we cannot see her in clinic this week.

## 2022-09-26 ENCOUNTER — Encounter (HOSPITAL_COMMUNITY): Payer: Self-pay | Admitting: Internal Medicine

## 2022-09-26 ENCOUNTER — Observation Stay (HOSPITAL_COMMUNITY): Payer: PPO

## 2022-09-26 DIAGNOSIS — Z8701 Personal history of pneumonia (recurrent): Secondary | ICD-10-CM | POA: Diagnosis not present

## 2022-09-26 DIAGNOSIS — Z87891 Personal history of nicotine dependence: Secondary | ICD-10-CM | POA: Diagnosis not present

## 2022-09-26 DIAGNOSIS — Z825 Family history of asthma and other chronic lower respiratory diseases: Secondary | ICD-10-CM | POA: Diagnosis not present

## 2022-09-26 DIAGNOSIS — R911 Solitary pulmonary nodule: Secondary | ICD-10-CM | POA: Diagnosis present

## 2022-09-26 DIAGNOSIS — Z86718 Personal history of other venous thrombosis and embolism: Secondary | ICD-10-CM | POA: Diagnosis not present

## 2022-09-26 DIAGNOSIS — R0600 Dyspnea, unspecified: Secondary | ICD-10-CM | POA: Diagnosis present

## 2022-09-26 DIAGNOSIS — K219 Gastro-esophageal reflux disease without esophagitis: Secondary | ICD-10-CM | POA: Diagnosis present

## 2022-09-26 DIAGNOSIS — R0602 Shortness of breath: Secondary | ICD-10-CM | POA: Diagnosis not present

## 2022-09-26 DIAGNOSIS — I10 Essential (primary) hypertension: Secondary | ICD-10-CM

## 2022-09-26 DIAGNOSIS — R0981 Nasal congestion: Secondary | ICD-10-CM | POA: Diagnosis present

## 2022-09-26 DIAGNOSIS — Z86711 Personal history of pulmonary embolism: Secondary | ICD-10-CM

## 2022-09-26 DIAGNOSIS — J441 Chronic obstructive pulmonary disease with (acute) exacerbation: Secondary | ICD-10-CM

## 2022-09-26 DIAGNOSIS — F32A Depression, unspecified: Secondary | ICD-10-CM | POA: Diagnosis present

## 2022-09-26 DIAGNOSIS — Z882 Allergy status to sulfonamides status: Secondary | ICD-10-CM | POA: Diagnosis not present

## 2022-09-26 DIAGNOSIS — Z79899 Other long term (current) drug therapy: Secondary | ICD-10-CM | POA: Diagnosis not present

## 2022-09-26 DIAGNOSIS — Z881 Allergy status to other antibiotic agents status: Secondary | ICD-10-CM | POA: Diagnosis not present

## 2022-09-26 DIAGNOSIS — J432 Centrilobular emphysema: Secondary | ICD-10-CM | POA: Diagnosis present

## 2022-09-26 DIAGNOSIS — Z887 Allergy status to serum and vaccine status: Secondary | ICD-10-CM | POA: Diagnosis not present

## 2022-09-26 DIAGNOSIS — R9389 Abnormal findings on diagnostic imaging of other specified body structures: Secondary | ICD-10-CM

## 2022-09-26 DIAGNOSIS — J852 Abscess of lung without pneumonia: Secondary | ICD-10-CM | POA: Diagnosis present

## 2022-09-26 DIAGNOSIS — Z833 Family history of diabetes mellitus: Secondary | ICD-10-CM | POA: Diagnosis not present

## 2022-09-26 DIAGNOSIS — Z888 Allergy status to other drugs, medicaments and biological substances status: Secondary | ICD-10-CM | POA: Diagnosis not present

## 2022-09-26 DIAGNOSIS — F419 Anxiety disorder, unspecified: Secondary | ICD-10-CM | POA: Diagnosis present

## 2022-09-26 DIAGNOSIS — Z1152 Encounter for screening for COVID-19: Secondary | ICD-10-CM | POA: Diagnosis not present

## 2022-09-26 DIAGNOSIS — Q969 Turner's syndrome, unspecified: Secondary | ICD-10-CM | POA: Diagnosis not present

## 2022-09-26 DIAGNOSIS — Z7901 Long term (current) use of anticoagulants: Secondary | ICD-10-CM | POA: Diagnosis not present

## 2022-09-26 DIAGNOSIS — Z8249 Family history of ischemic heart disease and other diseases of the circulatory system: Secondary | ICD-10-CM | POA: Diagnosis not present

## 2022-09-26 LAB — HIV ANTIBODY (ROUTINE TESTING W REFLEX): HIV Screen 4th Generation wRfx: NONREACTIVE

## 2022-09-26 MED ORDER — APIXABAN 5 MG PO TABS
5.0000 mg | ORAL_TABLET | Freq: Two times a day (BID) | ORAL | Status: DC
  Administered 2022-09-26 – 2022-09-29 (×8): 5 mg via ORAL
  Filled 2022-09-26 (×8): qty 1

## 2022-09-26 MED ORDER — SODIUM CHLORIDE 0.9 % IV SOLN
1.0000 g | INTRAVENOUS | Status: DC
  Administered 2022-09-26 – 2022-09-28 (×3): 1 g via INTRAVENOUS
  Filled 2022-09-26 (×3): qty 10

## 2022-09-26 MED ORDER — PANTOPRAZOLE SODIUM 40 MG PO TBEC
40.0000 mg | DELAYED_RELEASE_TABLET | Freq: Every day | ORAL | Status: DC
  Administered 2022-09-26 – 2022-09-29 (×4): 40 mg via ORAL
  Filled 2022-09-26 (×4): qty 1

## 2022-09-26 MED ORDER — ALBUTEROL SULFATE (2.5 MG/3ML) 0.083% IN NEBU: 2.5000 mg | INHALATION_SOLUTION | RESPIRATORY_TRACT | Status: DC | PRN

## 2022-09-26 MED ORDER — IPRATROPIUM-ALBUTEROL 0.5-2.5 (3) MG/3ML IN SOLN
3.0000 mL | Freq: Four times a day (QID) | RESPIRATORY_TRACT | Status: DC
  Administered 2022-09-26: 3 mL via RESPIRATORY_TRACT
  Filled 2022-09-26: qty 3

## 2022-09-26 MED ORDER — METHYLPREDNISOLONE SODIUM SUCC 40 MG IJ SOLR
40.0000 mg | Freq: Two times a day (BID) | INTRAMUSCULAR | Status: DC
  Administered 2022-09-26 – 2022-09-28 (×5): 40 mg via INTRAVENOUS
  Filled 2022-09-26 (×5): qty 1

## 2022-09-26 MED ORDER — MOMETASONE FURO-FORMOTEROL FUM 200-5 MCG/ACT IN AERO
2.0000 | INHALATION_SPRAY | Freq: Two times a day (BID) | RESPIRATORY_TRACT | Status: DC
  Administered 2022-09-26: 2 via RESPIRATORY_TRACT
  Filled 2022-09-26: qty 8.8

## 2022-09-26 MED ORDER — UMECLIDINIUM BROMIDE 62.5 MCG/ACT IN AEPB
1.0000 | INHALATION_SPRAY | Freq: Every day | RESPIRATORY_TRACT | Status: DC
  Administered 2022-09-26: 1 via RESPIRATORY_TRACT
  Filled 2022-09-26: qty 7

## 2022-09-26 MED ORDER — IOHEXOL 350 MG/ML SOLN
75.0000 mL | Freq: Once | INTRAVENOUS | Status: AC | PRN
  Administered 2022-09-26: 75 mL via INTRAVENOUS

## 2022-09-26 MED ORDER — TRIAMTERENE-HCTZ 37.5-25 MG PO TABS
0.5000 | ORAL_TABLET | Freq: Every day | ORAL | Status: DC
  Administered 2022-09-26 – 2022-09-29 (×4): 0.5 via ORAL
  Filled 2022-09-26 (×4): qty 0.5

## 2022-09-26 MED ORDER — SODIUM CHLORIDE (PF) 0.9 % IJ SOLN
INTRAMUSCULAR | Status: AC
  Filled 2022-09-26: qty 50

## 2022-09-26 MED ORDER — ACETAMINOPHEN 325 MG PO TABS
650.0000 mg | ORAL_TABLET | Freq: Four times a day (QID) | ORAL | Status: DC | PRN
  Administered 2022-09-26: 650 mg via ORAL
  Filled 2022-09-26: qty 2

## 2022-09-26 MED ORDER — LORAZEPAM 1 MG PO TABS
1.0000 mg | ORAL_TABLET | Freq: Every day | ORAL | Status: DC
  Administered 2022-09-26 – 2022-09-28 (×3): 1 mg via ORAL
  Filled 2022-09-26 (×3): qty 1

## 2022-09-26 MED ORDER — IPRATROPIUM-ALBUTEROL 0.5-2.5 (3) MG/3ML IN SOLN
3.0000 mL | Freq: Four times a day (QID) | RESPIRATORY_TRACT | Status: DC
  Administered 2022-09-26 – 2022-09-27 (×2): 3 mL via RESPIRATORY_TRACT
  Filled 2022-09-26 (×2): qty 3

## 2022-09-26 MED ORDER — TRAZODONE HCL 50 MG PO TABS
100.0000 mg | ORAL_TABLET | Freq: Every day | ORAL | Status: DC
  Administered 2022-09-26 – 2022-09-28 (×3): 100 mg via ORAL
  Filled 2022-09-26 (×3): qty 2

## 2022-09-26 MED ORDER — ESCITALOPRAM OXALATE 20 MG PO TABS
20.0000 mg | ORAL_TABLET | Freq: Every day | ORAL | Status: DC
  Administered 2022-09-26 – 2022-09-29 (×4): 20 mg via ORAL
  Filled 2022-09-26 (×4): qty 1

## 2022-09-26 MED ORDER — BUDESONIDE 0.5 MG/2ML IN SUSP
0.5000 mg | Freq: Two times a day (BID) | RESPIRATORY_TRACT | Status: DC
  Administered 2022-09-26 – 2022-09-29 (×6): 0.5 mg via RESPIRATORY_TRACT
  Filled 2022-09-26 (×6): qty 2

## 2022-09-26 MED ORDER — ALBUTEROL SULFATE (2.5 MG/3ML) 0.083% IN NEBU
2.5000 mg | INHALATION_SOLUTION | Freq: Three times a day (TID) | RESPIRATORY_TRACT | Status: DC
  Administered 2022-09-26: 2.5 mg via RESPIRATORY_TRACT
  Filled 2022-09-26: qty 3

## 2022-09-26 MED ORDER — ENOXAPARIN SODIUM 40 MG/0.4ML IJ SOSY: 40.0000 mg | PREFILLED_SYRINGE | INTRAMUSCULAR | Status: DC

## 2022-09-26 NOTE — Progress Notes (Signed)
BLE venous duplex has been completed.  Results can be found under chart review under CV PROC. 09/26/2022 12:29 PM Sundai Probert RVT, RDMS

## 2022-09-26 NOTE — Assessment & Plan Note (Addendum)
Recurrent PE possible, though wouldn't explain the URI symptoms today and seems less likely in setting of eliquis. Cont eliquis Will get BLE [place] to r/o DVT given recent swelling Pt actually has CT chest w/o contrast scheduled for 5/3 anyhow. So ill just go ahead and get a CTA chest: R/o PE Evaluate lung for Dr. Natale Lay the patient a trip back to the hospital for another CT scan.

## 2022-09-26 NOTE — Evaluation (Signed)
Occupational Therapy Evaluation Patient Details Name: [name] MRN: [id number] DOB: 01-31-1941 Today's Date: 09/26/2022   History of Present Illness [name] is a 82 y.o. 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 COPD, HTN. Pt with URI symptoms, cough, wheezing, and SOB recently.  Failed outpt PO ABx + PO steroids + inhalers.  Pulmonologist had her come into ED today   Clinical Impression   Pt is at Ind baseline level of function with ADLs, Sup with ADL mobility using no AD.  On RA, pt O2 SATs dropping to 78% with moderate exertion, HR low 100s with minimal exertion. Pt placed back on 3L O2 [place]. Pt instructed on deep, pursed lip breathing and O2 recovered to 88-91% in ~ 1 minute. Pt educated on energy conservatios strategies with handout provided. Pt would benefit fit 1-2 more OT sessions focusing on ADL and ADL mobility energy conservation and safety     Recommendations for follow up therapy are one component of a multi-disciplinary discharge planning process, led by the attending physician.  Recommendations may be updated based on patient status, additional functional criteria and insurance authorization.   Assistance Recommended at Discharge None  Patient can return home with the following Assistance with cooking/housework    Functional Status Assessment  Patient has had a recent decline in their functional status and demonstrates the ability to make significant improvements in function in a reasonable and predictable amount of time.  Equipment Recommendations  Tub/shower seat;Other (comment) (reacher, LH bath sponge)    Recommendations for Other Services       Precautions / Restrictions Precautions Precaution Comments: watch O2 SATs Restrictions Weight Bearing Restrictions: No      Mobility Bed  Mobility Overal bed mobility: Independent                  Transfers Overall transfer level: Needs assistance Equipment used: None Transfers: Sit to/from Stand, Bed to chair/wheelchair/BSC Sit to Stand: Supervision                  Balance Overall balance assessment: No apparent balance deficits (not formally assessed)                                         ADL either performed or assessed with clinical judgement   ADL Overall ADL's : At baseline;Independent                                       General ADL Comments: Pt Ind, but O2 SATs dropping to 78% with moderate exertion on RA, HR low 100s with minimal exertion. Pt placed back on 3L O2 [place]. Pt instructed on deep, pursed lip breathing and O2 recovered to 88-91% in ~ 1 minute. Pt educated on energy conservatios strategies with handout provided.     Vision Ability to See in Adequate Light: 0 Adequate Patient Visual Report: No change from baseline       Perception     Praxis      Pertinent Vitals/Pain Pain Assessment Pain Assessment: No/denies pain     Hand Dominance Right   Extremity/Trunk Assessment Upper Extremity Assessment Upper  Extremity Assessment: Overall WFL for tasks assessed   Lower Extremity Assessment Lower Extremity Assessment: Defer to PT evaluation   Cervical / Trunk Assessment Cervical / Trunk Assessment: Normal   Communication Communication Communication: No difficulties   Cognition Arousal/Alertness: Awake/alert Behavior During Therapy: WFL for tasks assessed/performed Overall Cognitive Status: Within Functional Limits for tasks assessed                                       General Comments       Exercises     Shoulder Instructions      Home Living Family/patient expects to be discharged to:: Private residence Living Arrangements: Alone Available Help at Discharge: Family;Neighbor Type of Home: House Home Access:  Stairs to enter [employer] of Steps: 1 Entrance Stairs-Rails: None Home Layout: One level     Bathroom Shower/Tub: Tub/shower unit;Walk-in shower   Bathroom Toilet: Handicapped height     Home Equipment: None          Prior Functioning/Environment Prior Level of Function : Independent/Modified Independent             Mobility Comments: no AD needed ADLs Comments: Ind with ADLs/selfcare, IADLs, home mgt, drives, eats out most of the time        OT Problem List: Decreased activity tolerance      OT Treatment/Interventions: Energy conservation;Therapeutic activities;Patient/family education    OT Goals(Current goals can be found in the care plan section) Acute Rehab OT Goals Patient Stated Goal: go home OT Goal Formulation: With patient Time For Goal Achievement: 10/10/22 Potential to Achieve Goals: Good ADL Goals Additional ADL Goal #1: Pt will verbalize and demo 3 energy conservation techniques for ADLs and ADL mobility and remain at  >88% O2 SATs  OT Frequency: Min 1X/week    Co-evaluation              AM-PAC OT "6 Clicks" Daily Activity     Outcome Measure Help from another person eating meals?: None Help from another person taking care of personal grooming?: None Help from another person toileting, which includes using toliet, bedpan, or urinal?: None Help from another person bathing (including washing, rinsing, drying)?: None Help from another person to put on and taking off regular upper body clothing?: None Help from another person to put on and taking off regular lower body clothing?: None 6 Click Score: 24   End of Session Nurse Communication: Mobility status  Activity Tolerance: Patient tolerated treatment well Patient left: in chair;with call bell/phone within reach  OT Visit Diagnosis: Muscle weakness (generalized) (M62.81)                Time: 1610-9604 OT Time Calculation (min): 21 min Charges:  OT Evaluation $OT Eval  Moderate Complexity: 1 Mod    Galen Manila 09/26/2022, 2:38 PM

## 2022-09-26 NOTE — Progress Notes (Signed)
Patient admitted earlier this morning.  H&P reviewed.  Patient seen and examined.  Patient continues to have a cough with occasional yellowish expectoration.  Denies any chest pain.  Her symptoms have been present for a few weeks and she has not noticed any improvement despite antibiotic courses.  Vital signs reviewed.  Saturations in the early to mid 90s on oxygen by nasal cannula at 3 L/min. Coarse breath sounds bilaterally with crackles at the bases.  Diffuse wheezing bilaterally. S1-S2 is normal regular. Abdomen is soft.  Labs reviewed.  BNP noted to be 46.  CT angiogram of the chest report reviewed.  PE was noted but this could be chronic as she does have a known history of PE and clot burden was less on this study compared to previous study.  Other abnormal findings also noted.  See report.  Continue with ceftriaxone Solu-Medrol.  Will continue with nebulizer treatments.  Will change to DuoNebs.  Will also change to budesonide nebulized.  Continue oxygen by nasal cannula.  Will request pulmonology to see the patient.  Continue Eliquis for history of PE.  Other issues as per H&P.  Will continue to follow.  Osvaldo Shipper 09/26/2022

## 2022-09-26 NOTE — H&P (Signed)
History and Physical    Patient: [name]:[id number] DOB: 19-Jul-1940 DOA: 09/25/2022 DOS: the patient was seen and examined on 09/26/2022 PCP: Fatima Sanger, [name]  Patient coming from: Home  Chief Complaint:  Chief Complaint  Patient presents with   Nasal Congestion   Shortness of Breath   HPI: [name] is a 82 y.o. 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 COPD, HTN.  Pt denies having missed any doses of eliquis recently prior to tonight.  Pt with URI symptoms, cough, wheezing, and SOB recently.  Failed outpt PO ABx + PO steroids + inhalers.  Pulmonologist had her come into ED today.  Has stuffy nose, runny nose.   Review of Systems: As mentioned in the history of present illness. All other systems reviewed and are negative. Past Medical History:  Diagnosis Date   Allergic rhinitis    Anemia due to GI blood loss    Anxiety    Asthma    Chronic airway obstruction, not elsewhere classified    Depressive disorder, not elsewhere classified    DVT (deep venous thrombosis) (HCC)    LLE DVT 01/13/19   Dyspnea    Esophageal reflux    Essential hypertension 08/27/2019   PE (pulmonary thromboembolism) (HCC) 01/29/2019   Pneumonia    Wears partial dentures    Past Surgical History:  Procedure Laterality Date   ABDOMINAL HYSTERECTOMY     ABDOMINAL HYSTERECTOMY     BRONCHIAL WASHINGS  01/24/2022   Procedure: BRONCHIAL WASHINGS;  Surgeon: Lupita Leash, MD;  Location: [facility] ENDOSCOPY;  Service: Cardiopulmonary;;   CATARACT EXTRACTION W/ INTRAOCULAR LENS  IMPLANT, BILATERAL     CHOLECYSTECTOMY N/A 08/12/2021   Procedure: LAPAROSCOPIC CHOLECYSTECTOMY;  Surgeon: Quentin Ore, MD;  Location: [facility] ORS;  Service: General;  Laterality: N/A;   COLONOSCOPY     COLONOSCOPY WITH PROPOFOL N/A 08/20/2019    Procedure: COLONOSCOPY WITH PROPOFOL;  Surgeon: Bernette Redbird, MD;  Location: [facility] ENDOSCOPY;  Service: Endoscopy;  Laterality: N/A;   CORONARY ULTRASOUND/IVUS Left 05/26/2019   Procedure: Intravascular Ultrasound/IVUS;  Surgeon: Maeola Harman, MD;  Location: [facility] INVASIVE CV LAB;  Service: Cardiovascular;  Laterality: Left;   CYSTOSCOPY     DILATION AND CURETTAGE OF UTERUS     ECTROPION REPAIR Bilateral 05/12/2021   Procedure: REPAIR OF ECTROPION BY LATERAL TARSAL STRIP OF BILATERAL LOWER EYE LIDS;  Surgeon: Dairl Ponder, MD;  Location: [facility] OR;  Service: Ophthalmology;  Laterality: Bilateral;   ESOPHAGOGASTRODUODENOSCOPY (EGD) WITH PROPOFOL N/A 08/20/2019   Procedure: ESOPHAGOGASTRODUODENOSCOPY (EGD) WITH PROPOFOL;  Surgeon: Bernette Redbird, MD;  Location: [facility] ENDOSCOPY;  Service: Endoscopy;  Laterality: N/A;   HEMOSTASIS CONTROL  08/20/2019   Procedure: HEMOSTASIS CONTROL;  Surgeon: Bernette Redbird, MD;  Location: [facility] ENDOSCOPY;  Service: Endoscopy;;   IVC FILTER REMOVAL N/A 03/24/2019   Procedure: IVC FILTER REMOVAL;  Surgeon: Maeola Harman, MD;  Location: [facility] INVASIVE CV LAB;  Service: Cardiovascular;  Laterality: N/A;   LACRIMAL TUBE INSERTION Right 05/12/2021   Procedure: RIGHT NASOLACRIMAL DUCT PROBE WITH MINI MONOKA STENT PLACEMENT;  Surgeon: Dairl Ponder, MD;  Location: [facility] OR;  Service: Ophthalmology;  Laterality: Right;   LESION EXCISION WITH COMPLEX REPAIR N/A 05/12/2021   Procedure: NASAL LESION EXCISION;  Surgeon: Dairl Ponder, MD;  Location: [facility] OR;  Service: Ophthalmology;  Laterality:  N/A;   LOWER EXTREMITY VENOGRAPHY Left 05/26/2019   Procedure: LOWER EXTREMITY VENOGRAPHY;  Surgeon: Maeola Harman, MD;  Location: [facility] INVASIVE CV LAB;  Service: Cardiovascular;  Laterality: Left;   MULTIPLE TOOTH EXTRACTIONS     NASAL SINUS SURGERY     PERIPHERAL VASCULAR INTERVENTION Left 05/26/2019   Procedure: PERIPHERAL VASCULAR INTERVENTION;  Surgeon:  Maeola Harman, MD;  Location: [facility] INVASIVE CV LAB;  Service: Cardiovascular;  Laterality: Left;  lower extremity veins   PERIPHERAL VASCULAR THROMBECTOMY  05/26/2019   Procedure: PERIPHERAL VASCULAR THROMBECTOMY;  Surgeon: Maeola Harman, MD;  Location: [facility] INVASIVE CV LAB;  Service: Cardiovascular;;   PTOSIS REPAIR Bilateral 05/12/2021   Procedure: EXTERNAL PTOSIS REPAIR LEVATOR ADVANCEDMENT/RESECTION OF BILATERAL UPPER EYE LIDS;  Surgeon: Dairl Ponder, MD;  Location: [facility] OR;  Service: Ophthalmology;  Laterality: Bilateral;   TUBAL LIGATION     VENA CAVA FILTER PLACEMENT N/A 01/13/2019   Procedure: INSERTION VENA-CAVA FILTER;  Surgeon: Maeola Harman, MD;  Location: [facility] OR;  Service: Vascular;  Laterality: N/A;   VIDEO BRONCHOSCOPY N/A 01/24/2022   Procedure: VIDEO BRONCHOSCOPY WITHOUT FLUORO;  Surgeon: Lupita Leash, MD;  Location: [facility] ENDOSCOPY;  Service: Cardiopulmonary;  Laterality: N/A;   Social History:  reports that she quit smoking about 24 years ago. Her smoking use included cigarettes. She has a 10.00 pack-year smoking history. She has never used smokeless tobacco. She reports that she does not drink alcohol and does not use drugs.  Allergies  Allergen Reactions   Pneumococcal Vaccines Shortness Of Breath and Other (See Comments)    Couldn't breathe    Clarithromycin Swelling   Estradiol Rash    Reaction to cream   Sulfa Antibiotics Nausea And Vomiting and Swelling   Doxycycline Hyclate Other (See Comments)    stomach distress   Pneumococcal Polysaccharide Vaccine Other (See Comments)    Unknown   Sulfacetamide Sodium-Sulfur Other (See Comments)   Sulfonamide Derivatives Swelling    Family History  Problem Relation Age of Onset   Diabetes Mother    Coronary artery disease Mother    Emphysema Father    Coronary artery disease Brother     Prior to Admission medications   Medication Sig Start Date End Date Taking? Authorizing Provider   acetaminophen (TYLENOL) 500 MG tablet Take 500 mg by mouth every 6 (six) hours as needed for headache (pain).   Yes [provider]  albuterol (PROVENTIL) (2.5 MG/3ML) 0.083% nebulizer solution Take 3 mLs (2.5 mg total) by nebulization every 6 (six) hours as needed for wheezing or shortness of breath. 02/28/22  Yes Lupita Leash, MD  albuterol (VENTOLIN HFA) 108 (90 Base) MCG/ACT inhaler Inhale 1-2 puffs into the lungs every 6 (six) hours as needed for wheezing or shortness of breath.   Yes [provider]  amoxicillin-clavulanate (AUGMENTIN) 875-125 MG tablet Take 1 tablet by mouth 2 (two) times daily for 7 days. 09/20/22 09/27/22 Yes Omar Person, MD  apixaban (ELIQUIS) 5 MG TABS tablet Take 5 mg by mouth 2 (two) times daily.   Yes [provider]  Budeson-Glycopyrrol-Formoterol (BREZTRI AEROSPHERE) 160-9-4.8 MCG/ACT AERO Inhale 2 puffs into the lungs in the morning and at bedtime. 06/21/22  Yes Lupita Leash, MD  escitalopram (LEXAPRO) 20 MG tablet Take 20 mg by mouth daily.   Yes [provider]  furosemide (LASIX) 20 MG tablet Take 20 mg by mouth daily as needed for fluid or edema. Takes when having swelling.   Yes [provider]  LORazepam (ATIVAN) 1 MG tablet Take 1 mg by mouth at bedtime.   Yes [provider]  omeprazole (PRILOSEC) 20 MG capsule Take 20 mg by mouth daily. 03/26/20  Yes [provider]  predniSONE (DELTASONE) 10 MG tablet Take 4 tabs by mouth for 3 days, then 3 for 3 days, 2 for 3 days, 1 for 3 days and stop 09/20/22  Yes Omar Person, MD  traZODone (DESYREL) 100 MG tablet Take 100 mg by mouth at bedtime.   Yes [provider]  triamterene-hydrochlorothiazide (MAXZIDE-25) 37.5-25 MG tablet Take 0.5 tablets by mouth daily. 02/06/19  Yes [provider]    Physical Exam: Vitals:   09/25/22 2045 09/25/22 2230 09/26/22 0054 09/26/22 0148  BP: (!) 141/113 (!) 127/94 (!) 158/91 (!)  144/94  Pulse: 77 92 93 (!) 104  Resp: 16 17  18   Temp: 98.2 F (36.8 C) 98.1 F (36.7 C) 98.3 F (36.8 C) 98 F (36.7 C)  TempSrc: Oral Oral Oral Oral  SpO2: 100% 92% 93% 90%  Weight:      Height:       Constitutional: NAD, calm, comfortable Respiratory: Diffuse wheezing with prolonged expiratory phase. Cardiovascular: Regular rate and rhythm, no murmurs / rubs / gallops. No extremity edema. 2+ pedal pulses. No carotid bruits.  Abdomen: no tenderness, no masses palpated. No hepatosplenomegaly. Bowel sounds positive.  Neurologic: CN 2-12 grossly intact. Sensation intact, DTR normal. Strength 5/5 in all 4.  Psychiatric: Normal judgment and insight. Alert and oriented x 3. Normal mood.   Data Reviewed:        Latest Ref Rng & Units 09/25/2022    8:24 PM 05/28/2022    6:29 PM 02/15/2022    6:42 AM  CBC  WBC 4.0 - 10.5 K/uL 8.0  10.5  15.3   Hemoglobin 12.0 - 15.0 g/dL 16.1  09.6  04.5   Hematocrit 36.0 - 46.0 % 45.0  40.6  45.5   Platelets 150 - 400 K/uL 420  521  351       Latest Ref Rng & Units 09/25/2022    8:24 PM 05/28/2022    6:29 PM 02/14/2022    7:51 AM  CMP  Glucose 70 - 99 mg/dL 99  93  409   BUN 8 - 23 mg/dL 23  10  18    Creatinine 0.44 - 1.00 mg/dL 8.11  9.14  7.82   Sodium 135 - 145 mmol/L 137  141  137   Potassium 3.5 - 5.1 mmol/L 3.6  3.9  3.8   Chloride 98 - 111 mmol/L 99  102  101   CO2 22 - 32 mmol/L 28  29  22    Calcium 8.9 - 10.3 mg/dL 8.7  8.6  9.2   Total Protein 6.5 - 8.1 g/dL  6.4    Total Bilirubin 0.3 - 1.2 mg/dL  0.5    Alkaline Phos 38 - 126 U/L  54    AST 15 - 41 U/L  19    ALT 0 - 44 U/L  13     BNP nl  CXR: IMPRESSION: No active cardiopulmonary disease. Prominent central pulmonary vessels suggesting arterial hypertension.  Assessment and Plan: * COPD exacerbation (HCC) DDx includes PE: though less likely. COPD pathway PRN and scheduled nebs Rocephin Solumedrol Pulse ox and O2 via [place]  History of pulmonary embolism Recurrent  PE possible, though wouldn't explain the URI symptoms today and seems less likely in setting of eliquis.  Cont eliquis Will get BLE [place] to r/o DVT given recent swelling Pt actually has CT chest w/o contrast scheduled for 5/3 anyhow. So ill just go ahead and get a CTA chest: R/o PE Evaluate lung for Dr. Natale Lay the patient a trip back to the hospital for another CT scan.  Essential hypertension Cont Maxzide      Advance Care Planning:   Code Status: Full Code Verified full code status with patient  Consults: None  Family Communication: No family in room  Severity of Illness: The appropriate patient status for this patient is OBSERVATION. Observation status is judged to be reasonable and necessary in order to provide the required intensity of service to ensure the patient's safety. The patient's presenting symptoms, physical exam findings, and initial radiographic and laboratory data in the context of their medical condition is felt to place them at decreased risk for further clinical deterioration. Furthermore, it is anticipated that the patient will be medically stable for discharge from the hospital within 2 midnights of admission.   Author: Hillary Bow., DO 09/26/2022 2:26 AM  For on call review [email or web address].

## 2022-09-26 NOTE — Progress Notes (Signed)
NAME:  [name], MRN:  [id number], DOB:  July 26, 1940, LOS: 0 ADMISSION DATE:  09/25/2022, CONSULTATION DATE: 09/26/2022 REFERRING MD: Dr. Rito Ehrlich, CHIEF COMPLAINT: Dyspnea  History of Present Illness:  82 year old woman with a history of former tobacco use, COPD, DVT right lower lobe lung abscess (versus bullitis) treated with an extended course of Augmentin.  Multiple exacerbations that have required antibiotics and steroids since that time.  She is admitted now with an acute exacerbation that did not respond to outpatient prednisone and Keflex.  CT-PA done 4/30 showed small rounded right lower lobe lesion, consistent with residua from her treated lung abscess  Pertinent  Medical History   Past Medical History:  Diagnosis Date   Allergic rhinitis    Anemia due to GI blood loss    Anxiety    Asthma    Chronic airway obstruction, not elsewhere classified    Depressive disorder, not elsewhere classified    DVT (deep venous thrombosis) (HCC)    LLE DVT 01/13/19   Dyspnea    Esophageal reflux    Essential hypertension 08/27/2019   PE (pulmonary thromboembolism) (HCC) 01/29/2019   Pneumonia    Wears partial dentures     Significant Hospital Events: Including procedures, antibiotic start and stop dates in addition to other pertinent events   CT-PA 09/26/22 > no pulmonary embolism, small subsegmental filling defects consistent with PE, suspect chronic clot.  No mediastinal or hilar adenopathy.  This centrilobular emphysema.  Large-scale resolution of right lower lobe cavity, suspect now just 1.3 cm rounded fluid-filled cavity  Interim History / Subjective:  Feeling some better with steroids and antibiotics  Objective   Blood pressure 139/79, pulse 77, temperature 98.4 F (36.9 C), temperature source Oral, resp. rate 18, height 5\' 6"  (1.676 m), weight 73.9 kg, SpO2 97 %.        Intake/Output Summary (Last 24 hours) at 09/26/2022 1817 Last data filed at 09/26/2022 1024 Gross per 24  hour  Intake 340 ml  Output --  Net 340 ml   Filed Weights   09/25/22 1818  Weight: 73.9 kg    Examination: General: Elderly woman sitting up to chair HENT: Oropharynx clear, no stridor, no secretions Lungs: Distant bilaterally, end expiratory wheezes Cardiovascular: Regular, no murmur Abdomen: Nondistended, positive bowel sounds Extremities: No edema Neuro: Awake, alert, nonfocal GU: Deferred  Resolved Hospital Problem list     Assessment & Plan:  Acute exacerbation of COPD, slow to resolve, failed outpatient prednisone and Keflex.  Question whether small residual area of right lower lobe abscess is driving this flare, caused her to require admission. -Agree with bronchodilators as ordered -Ceftriaxone as ordered -Solu-Medrol as ordered -She will need repeat chest imaging to follow the right lower lobe nodular opacity in about 6 to 8 weeks.  Hopefully the antibiotics given this admission will allow for complete resolution.  If it persists then may need further diagnostics, navigational bronchoscopy, etc.   Labs   CBC: Recent Labs  Lab 09/25/22 2024  WBC 8.0  NEUTROABS 6.8  HGB 13.6  HCT 45.0  MCV 88.9  PLT 420*    Basic Metabolic Panel: Recent Labs  Lab 09/25/22 2024  NA 137  K 3.6  CL 99  CO2 28  GLUCOSE 99  BUN 23  CREATININE 0.85  CALCIUM 8.7*   GFR: Estimated Creatinine Clearance: 55.2 mL/min (by C-G formula based on SCr of 0.85 mg/dL). Recent Labs  Lab 09/25/22 2024  WBC 8.0    Liver Function Tests:  No results for input(s): "AST", "ALT", "ALKPHOS", "BILITOT", "PROT", "ALBUMIN" in the last 168 hours. No results for input(s): "LIPASE", "AMYLASE" in the last 168 hours. No results for input(s): "AMMONIA" in the last 168 hours.  ABG No results found for: "PHART", "PCO2ART", "PO2ART", "HCO3", "TCO2", "ACIDBASEDEF", "O2SAT"   Coagulation Profile: No results for input(s): "INR", "PROTIME" in the last 168 hours.  Cardiac Enzymes: No results  for input(s): "CKTOTAL", "CKMB", "CKMBINDEX", "TROPONINI" in the last 168 hours.  HbA1C: Hgb A1c MFr Bld  Date/Time Value Ref Range Status  08/27/2019 05:07 PM 4.9 4.8 - 5.6 % Final    Comment:    (NOTE) Pre diabetes:          5.7%-6.4% Diabetes:              >6.4% Glycemic control for   <7.0% adults with diabetes   05/28/2013 05:45 AM 5.8 (H) <5.7 % Final    Comment:    (NOTE)                                                                       According to the ADA Clinical Practice Recommendations for 2011, when HbA1c is used as a screening test:  >=6.5%   Diagnostic of Diabetes Mellitus           (if abnormal result is confirmed) 5.7-6.4%   Increased risk of developing Diabetes Mellitus References:Diagnosis and Classification of Diabetes Mellitus,Diabetes Care,2011,34(Suppl 1):S62-S69 and Standards of Medical Care in         Diabetes - 2011,Diabetes Care,2011,34 (Suppl 1):S11-S61.    CBG: No results for input(s): "GLUCAP" in the last 168 hours.  Review of Systems:   As per HPI  Past Medical History:  She,  has a past medical history of Allergic rhinitis, Anemia due to GI blood loss, Anxiety, Asthma, Chronic airway obstruction, not elsewhere classified, Depressive disorder, not elsewhere classified, DVT (deep venous thrombosis) (HCC), Dyspnea, Esophageal reflux, Essential hypertension (08/27/2019), PE (pulmonary thromboembolism) (HCC) (01/29/2019), Pneumonia, and Wears partial dentures.   Surgical History:   Past Surgical History:  Procedure Laterality Date   ABDOMINAL HYSTERECTOMY     ABDOMINAL HYSTERECTOMY     BRONCHIAL WASHINGS  01/24/2022   Procedure: BRONCHIAL WASHINGS;  Surgeon: Lupita Leash, MD;  Location: [facility] ENDOSCOPY;  Service: Cardiopulmonary;;   CATARACT EXTRACTION W/ INTRAOCULAR LENS  IMPLANT, BILATERAL     CHOLECYSTECTOMY N/A 08/12/2021   Procedure: LAPAROSCOPIC CHOLECYSTECTOMY;  Surgeon: Quentin Ore, MD;  Location: [facility] ORS;  Service: General;   Laterality: N/A;   COLONOSCOPY     COLONOSCOPY WITH PROPOFOL N/A 08/20/2019   Procedure: COLONOSCOPY WITH PROPOFOL;  Surgeon: Bernette Redbird, MD;  Location: [facility] ENDOSCOPY;  Service: Endoscopy;  Laterality: N/A;   CORONARY ULTRASOUND/IVUS Left 05/26/2019   Procedure: Intravascular Ultrasound/IVUS;  Surgeon: Maeola Harman, MD;  Location: [facility] INVASIVE CV LAB;  Service: Cardiovascular;  Laterality: Left;   CYSTOSCOPY     DILATION AND CURETTAGE OF UTERUS     ECTROPION REPAIR Bilateral 05/12/2021   Procedure: REPAIR OF ECTROPION BY LATERAL TARSAL STRIP OF BILATERAL LOWER EYE LIDS;  Surgeon: Dairl Ponder, MD;  Location: [facility] OR;  Service: Ophthalmology;  Laterality: Bilateral;   ESOPHAGOGASTRODUODENOSCOPY (EGD) WITH PROPOFOL N/A 08/20/2019  Procedure: ESOPHAGOGASTRODUODENOSCOPY (EGD) WITH PROPOFOL;  Surgeon: Bernette Redbird, MD;  Location: [facility] ENDOSCOPY;  Service: Endoscopy;  Laterality: N/A;   HEMOSTASIS CONTROL  08/20/2019   Procedure: HEMOSTASIS CONTROL;  Surgeon: Bernette Redbird, MD;  Location: [facility] ENDOSCOPY;  Service: Endoscopy;;   IVC FILTER REMOVAL N/A 03/24/2019   Procedure: IVC FILTER REMOVAL;  Surgeon: Maeola Harman, MD;  Location: [facility] INVASIVE CV LAB;  Service: Cardiovascular;  Laterality: N/A;   LACRIMAL TUBE INSERTION Right 05/12/2021   Procedure: RIGHT NASOLACRIMAL DUCT PROBE WITH MINI MONOKA STENT PLACEMENT;  Surgeon: Dairl Ponder, MD;  Location: [facility] OR;  Service: Ophthalmology;  Laterality: Right;   LESION EXCISION WITH COMPLEX REPAIR N/A 05/12/2021   Procedure: NASAL LESION EXCISION;  Surgeon: Dairl Ponder, MD;  Location: [facility] OR;  Service: Ophthalmology;  Laterality: N/A;   LOWER EXTREMITY VENOGRAPHY Left 05/26/2019   Procedure: LOWER EXTREMITY VENOGRAPHY;  Surgeon: Maeola Harman, MD;  Location: [facility] INVASIVE CV LAB;  Service: Cardiovascular;  Laterality: Left;   MULTIPLE TOOTH EXTRACTIONS     NASAL SINUS SURGERY     PERIPHERAL VASCULAR  INTERVENTION Left 05/26/2019   Procedure: PERIPHERAL VASCULAR INTERVENTION;  Surgeon: Maeola Harman, MD;  Location: [facility] INVASIVE CV LAB;  Service: Cardiovascular;  Laterality: Left;  lower extremity veins   PERIPHERAL VASCULAR THROMBECTOMY  05/26/2019   Procedure: PERIPHERAL VASCULAR THROMBECTOMY;  Surgeon: Maeola Harman, MD;  Location: [facility] INVASIVE CV LAB;  Service: Cardiovascular;;   PTOSIS REPAIR Bilateral 05/12/2021   Procedure: EXTERNAL PTOSIS REPAIR LEVATOR ADVANCEDMENT/RESECTION OF BILATERAL UPPER EYE LIDS;  Surgeon: Dairl Ponder, MD;  Location: [facility] OR;  Service: Ophthalmology;  Laterality: Bilateral;   TUBAL LIGATION     VENA CAVA FILTER PLACEMENT N/A 01/13/2019   Procedure: INSERTION VENA-CAVA FILTER;  Surgeon: Maeola Harman, MD;  Location: [facility] OR;  Service: Vascular;  Laterality: N/A;   VIDEO BRONCHOSCOPY N/A 01/24/2022   Procedure: VIDEO BRONCHOSCOPY WITHOUT FLUORO;  Surgeon: Lupita Leash, MD;  Location: [facility] ENDOSCOPY;  Service: Cardiopulmonary;  Laterality: N/A;     Social History:   reports that she quit smoking about 24 years ago. Her smoking use included cigarettes. She has a 10.00 pack-year smoking history. She has never used smokeless tobacco. She reports that she does not drink alcohol and does not use drugs.   Family History:  Her family history includes Coronary artery disease in her brother and mother; Diabetes in her mother; Emphysema in her father.   Allergies Allergies  Allergen Reactions   Pneumococcal Vaccines Shortness Of Breath and Other (See Comments)    Couldn't breathe    Clarithromycin Swelling   Estradiol Rash    Reaction to cream   Sulfa Antibiotics Nausea And Vomiting and Swelling   Doxycycline Hyclate Other (See Comments)    stomach distress   Pneumococcal Polysaccharide Vaccine Other (See Comments)    Unknown   Sulfacetamide Sodium-Sulfur Other (See Comments)   Sulfonamide Derivatives Swelling     Home  Medications  Prior to Admission medications   Medication Sig Start Date End Date Taking? Authorizing Provider  acetaminophen (TYLENOL) 500 MG tablet Take 500 mg by mouth every 6 (six) hours as needed for headache (pain).   Yes [provider]  albuterol (PROVENTIL) (2.5 MG/3ML) 0.083% nebulizer solution Take 3 mLs (2.5 mg total) by nebulization every 6 (six) hours as needed for wheezing or shortness of breath. 02/28/22  Yes Lupita Leash, MD  albuterol (VENTOLIN HFA) 108 (90 Base) MCG/ACT inhaler Inhale 1-2 puffs into the  lungs every 6 (six) hours as needed for wheezing or shortness of breath.   Yes [provider]  amoxicillin-clavulanate (AUGMENTIN) 875-125 MG tablet Take 1 tablet by mouth 2 (two) times daily for 7 days. 09/20/22 09/27/22 Yes Omar Person, MD  apixaban (ELIQUIS) 5 MG TABS tablet Take 5 mg by mouth 2 (two) times daily.   Yes [provider]  Budeson-Glycopyrrol-Formoterol (BREZTRI AEROSPHERE) 160-9-4.8 MCG/ACT AERO Inhale 2 puffs into the lungs in the morning and at bedtime. 06/21/22  Yes Lupita Leash, MD  escitalopram (LEXAPRO) 20 MG tablet Take 20 mg by mouth daily.   Yes [provider]  furosemide (LASIX) 20 MG tablet Take 20 mg by mouth daily as needed for fluid or edema. Takes when having swelling.   Yes [provider]  LORazepam (ATIVAN) 1 MG tablet Take 1 mg by mouth at bedtime.   Yes [provider]  omeprazole (PRILOSEC) 20 MG capsule Take 20 mg by mouth daily. 03/26/20  Yes [provider]  predniSONE (DELTASONE) 10 MG tablet Take 4 tabs by mouth for 3 days, then 3 for 3 days, 2 for 3 days, 1 for 3 days and stop 09/20/22  Yes Omar Person, MD  traZODone (DESYREL) 100 MG tablet Take 100 mg by mouth at bedtime.   Yes [provider]  triamterene-hydrochlorothiazide (MAXZIDE-25) 37.5-25 MG tablet Take 0.5 tablets by mouth daily. 02/06/19  Yes [provider]     Critical care  time: NA      Levy Pupa, MD, PhD 09/26/2022, 6:17 PM [facility] [phone] or if no answer before 7:00PM call [phone] For any issues after 7:00PM please call eLink [phone]

## 2022-09-26 NOTE — Assessment & Plan Note (Signed)
Cont Maxzide

## 2022-09-26 NOTE — Progress Notes (Signed)
Nutrition Brief Note  RD consulted via COPD protocol.  Patient reports good appetite and no issues with eating. Pt reports her weight has been stable, maybe increasing slightly since starting prednisone. Does not like low sodium diet.  Wt Readings from Last 15 Encounters:  09/25/22 73.9 kg  09/20/22 75.6 kg  09/11/22 76 kg  08/22/22 76.8 kg  06/14/22 71.7 kg  06/13/22 72.1 kg  03/29/22 77.1 kg  02/28/22 72.1 kg  02/13/22 72.1 kg  02/07/22 73.2 kg  02/02/22 73 kg  01/23/22 76.2 kg  12/08/21 76.3 kg  11/24/21 76.5 kg  11/16/21 74.8 kg    Body mass index is 26.31 kg/m. Patient meets criteria for overweight based on current BMI.   Current diet order is Heart Healthy, patient is consuming approximately 95% of meals at this time. Labs and medications reviewed.   No nutrition interventions warranted at this time. If nutrition issues arise, please consult RD.   [name] Franco, MS, RD, LDN Inpatient Clinical Dietitian Contact information available via Amion

## 2022-09-26 NOTE — Evaluation (Signed)
Physical Therapy Evaluation Patient Details Name: [name] MRN: [id number] DOB: 12-01-1940 Today's Date: 09/26/2022  History of Present Illness  Pt is 82 yo female admitted on 09/25/22 with URI symptoms-admitted with COPD exacerbation. Pt found to  have L LE DVT chronic and age indeterminate. Pt with hx of May Turner syndrome, recurrent PE, COPD, HTN.  Clinical Impression   Pt admitted with above diagnosis.  She is normally independent and reports has O2 at home that she wears with activity at times.  Pt has been ambulating in room on her own this hospitalization.  She was able to demonstrate safe tranfers and ambulated 400' with therapy without AD.  She was on 3 L O2 with sats 91% rest and 85% ambulation (but asymptomatic) with recovery <30 seconds.  Pt reports her O2 is typically 88-90% at home.  Pt demonstrates safe mobility and does not require skilled therapy services. Recommend continued ambulation in room on her own and hallway with staff for O2 tank management.         Recommendations for follow up therapy are one component of a multi-disciplinary discharge planning process, led by the attending physician.  Recommendations may be updated based on patient status, additional functional criteria and insurance authorization.  Follow Up Recommendations       Assistance Recommended at Discharge PRN  Patient can return home with the following       Equipment Recommendations None recommended by PT  Recommendations for Other Services       Functional Status Assessment Patient has not had a recent decline in their functional status     Precautions / Restrictions Precautions Precaution Comments: watch O2 SATs Restrictions Weight Bearing Restrictions: No      Mobility  Bed Mobility Overal bed mobility: Independent                  Transfers Overall transfer level: Independent Equipment used: None Transfers: Sit to/from Stand, Bed to chair/wheelchair/BSC Sit to  Stand: Independent           General transfer comment: Pt has been ambulating independently in room.  She demonstrated safely during session. Reports she has been taking O2 off to go to bathroom .  Provided O2 extenstion and educated to keep O2 in place.    Ambulation/Gait Ambulation/Gait assistance: Supervision Gait Distance (Feet): 400 Feet Assistive device: None Gait Pattern/deviations: Step-through pattern Gait velocity: decreased     General Gait Details: Normal gait pattern  Stairs            Wheelchair Mobility    Modified Rankin (Stroke Patients Only)       Balance Overall balance assessment: No apparent balance deficits (not formally assessed)                                           Pertinent Vitals/Pain Pain Assessment Pain Assessment: No/denies pain    Home Living Family/patient expects to be discharged to:: Private residence Living Arrangements: Alone Available Help at Discharge: Family;Neighbor Type of Home: House Home Access: Stairs to enter Entrance Stairs-Rails: None Entrance Stairs-Number of Steps: 1   Home Layout: One level Home Equipment: None Additional Comments: Reports has O2 at home and uses on occasion with activity.  Has pulse ox monitor - reports normally 88-90% baseline    Prior Function Prior Level of Function : Independent/Modified Independent  Mobility Comments: Could ambulate in community; no AD needed ADLs Comments: Ind with ADLs/selfcare, IADLs, home mgt, drives, eats out most of the time     Hand Dominance   Dominant Hand: Right    Extremity/Trunk Assessment   Upper Extremity Assessment Upper Extremity Assessment: Overall WFL for tasks assessed    Lower Extremity Assessment Lower Extremity Assessment: Overall WFL for tasks assessed    Cervical / Trunk Assessment Cervical / Trunk Assessment: Normal  Communication   Communication: No difficulties  Cognition  Arousal/Alertness: Awake/alert Behavior During Therapy: WFL for tasks assessed/performed Overall Cognitive Status: Within Functional Limits for tasks assessed                                          General Comments General comments (skin integrity, edema, etc.): Pt on 3 L O2 with sats 91% rest; 85% ambulation but quickly improved to 90% with rest (~30 seconds)    Exercises     Assessment/Plan    PT Assessment Patient does not need any further PT services  PT Problem List         PT Treatment Interventions      PT Goals (Current goals can be found in the Care Plan section)  Acute Rehab PT Goals Patient Stated Goal: return home PT Goal Formulation: All assessment and education complete, DC therapy Time For Goal Achievement: 10/10/22 Potential to Achieve Goals: Good    Frequency       Co-evaluation               AM-PAC PT "6 Clicks" Mobility  Outcome Measure Help needed turning from your back to your side while in a flat bed without using bedrails?: None Help needed moving from lying on your back to sitting on the side of a flat bed without using bedrails?: None Help needed moving to and from a bed to a chair (including a wheelchair)?: None Help needed standing up from a chair using your arms (e.g., wheelchair or bedside chair)?: None Help needed to walk in hospital room?: None Help needed climbing 3-5 steps with a railing? : A Little 6 Click Score: 23    End of Session Equipment Utilized During Treatment: Gait belt Activity Tolerance: Patient tolerated treatment well Patient left: in chair;with call bell/phone within reach Nurse Communication: Mobility status PT Visit Diagnosis: Other abnormalities of gait and mobility (R26.89)    Time: 1610-9604 PT Time Calculation (min) (ACUTE ONLY): 10 min   Charges:   PT Evaluation $PT Eval Low Complexity: 1 Low          Sarika Baldini, PT Acute Rehab [facility] [phone]   Rayetta Humphrey 09/26/2022, 5:22 PM

## 2022-09-26 NOTE — ED Notes (Signed)
Provider at bedside

## 2022-09-26 NOTE — Assessment & Plan Note (Addendum)
DDx includes PE: though less likely. COPD pathway PRN and scheduled nebs Rocephin Solumedrol Pulse ox and O2 via [place]

## 2022-09-27 DIAGNOSIS — I10 Essential (primary) hypertension: Secondary | ICD-10-CM | POA: Diagnosis not present

## 2022-09-27 DIAGNOSIS — Z86711 Personal history of pulmonary embolism: Secondary | ICD-10-CM | POA: Diagnosis not present

## 2022-09-27 DIAGNOSIS — J441 Chronic obstructive pulmonary disease with (acute) exacerbation: Secondary | ICD-10-CM | POA: Diagnosis not present

## 2022-09-27 LAB — BASIC METABOLIC PANEL
Anion gap: 9 (ref 5–15)
BUN: 22 mg/dL (ref 8–23)
CO2: 31 mmol/L (ref 22–32)
Calcium: 9.1 mg/dL (ref 8.9–10.3)
Chloride: 98 mmol/L (ref 98–111)
Creatinine, Ser: 0.73 mg/dL (ref 0.44–1.00)
GFR, Estimated: >60 mL/min (ref >60)
Glucose, Bld: 175 mg/dL — ABNORMAL HIGH (ref 70–99)
Potassium: 4.3 mmol/L (ref 3.5–5.1)
Sodium: 138 mmol/L (ref 135–145)

## 2022-09-27 LAB — CBC
HCT: 44.8 % (ref 36.0–46.0)
Hemoglobin: 13.7 g/dL (ref 12.0–15.0)
MCH: 27.1 pg (ref 26.0–34.0)
MCHC: 30.6 g/dL (ref 30.0–36.0)
MCV: 88.7 fL (ref 80.0–100.0)
Platelets: 445 10*3/uL — ABNORMAL HIGH (ref 150–400)
RBC: 5.05 MIL/uL (ref 3.87–5.11)
RDW: 14.7 % (ref 11.5–15.5)
WBC: 10.5 10*3/uL (ref 4.0–10.5)
nRBC: 0 % (ref 0.0–0.2)

## 2022-09-27 LAB — RESPIRATORY PANEL BY PCR

## 2022-09-27 MED ORDER — GUAIFENESIN-DM 100-10 MG/5ML PO SYRP
10.0000 mL | ORAL_SOLUTION | ORAL | Status: DC | PRN
  Administered 2022-09-27 (×2): 10 mL via ORAL
  Filled 2022-09-27 (×2): qty 10

## 2022-09-27 MED ORDER — IPRATROPIUM-ALBUTEROL 0.5-2.5 (3) MG/3ML IN SOLN
3.0000 mL | Freq: Three times a day (TID) | RESPIRATORY_TRACT | Status: DC
  Administered 2022-09-27 – 2022-09-29 (×6): 3 mL via RESPIRATORY_TRACT
  Filled 2022-09-27 (×6): qty 3

## 2022-09-27 NOTE — Progress Notes (Signed)
NAME:  [name], MRN:  [id number], DOB:  May 06, 1941, LOS: 1 ADMISSION DATE:  09/25/2022, CONSULTATION DATE: 09/26/2022 REFERRING MD: Dr. Rito Ehrlich, CHIEF COMPLAINT: Dyspnea  History of Present Illness:  82 year old woman with a history of former tobacco use, COPD, DVT right lower lobe lung abscess (versus bullitis) treated with an extended course of Augmentin.  Multiple exacerbations that have required antibiotics and steroids since that time.  She is admitted now with an acute exacerbation that did not respond to outpatient prednisone and Keflex.  CT-PA done 4/30 showed small rounded right lower lobe lesion, consistent with residua from her treated lung abscess  Pertinent  Medical History   Past Medical History:  Diagnosis Date   Allergic rhinitis    Anemia due to GI blood loss    Anxiety    Asthma    Chronic airway obstruction, not elsewhere classified    Depressive disorder, not elsewhere classified    DVT (deep venous thrombosis) (HCC)    LLE DVT 01/13/19   Dyspnea    Esophageal reflux    Essential hypertension 08/27/2019   PE (pulmonary thromboembolism) (HCC) 01/29/2019   Pneumonia    Wears partial dentures     Significant Hospital Events: Including procedures, antibiotic start and stop dates in addition to other pertinent events   CT-PA 09/26/22 > no pulmonary embolism, small subsegmental filling defects consistent with PE, suspect chronic clot.  No mediastinal or hilar adenopathy.  This centrilobular emphysema.  Large-scale resolution of right lower lobe cavity, suspect now just 1.3 cm rounded fluid-filled cavity  Interim History / Subjective:   Still with a lot of cough, some dyspnea but may be slightly better  Objective   Blood pressure (!) 148/88, pulse 68, temperature 98.1 F (36.7 C), temperature source Oral, resp. rate 18, height 5\' 6"  (1.676 m), weight 73.9 kg, SpO2 91 %.        Intake/Output Summary (Last 24 hours) at 09/27/2022 1205 Last data filed at  09/27/2022 0865 Gross per 24 hour  Intake 480 ml  Output --  Net 480 ml   Filed Weights   09/25/22 1818  Weight: 73.9 kg    Examination: General: Laying in bed in no distress HENT: Strong voice, she does have some upper airway expiratory noise, no secretions Lungs: Bilateral end expiratory wheezes, some referred upper airway noise Cardiovascular: Regular, distant, no murmur Abdomen: Nondistended, positive bowel sounds Extremities: No edema Neuro: Awake, alert, nonfocal GU: Deferred  Resolved Hospital Problem list     Assessment & Plan:  Acute exacerbation of COPD, slow to resolve, failed outpatient prednisone and Keflex.  Question whether small residual area of right lower lobe abscess is driving this flare, caused her to require admission. -Continue ceftriaxone -Can probably transition Solu-Medrol to prednisone and begin to taper on 5/2 -Continue bronchodilators as ordered -She needs a repeat CT scan of the chest in 6-8 weeks to recheck the right lower lobe nodular opacity.  Suspect that this is the resolving right lower lobe abscess/bullous lesion.  If it persists that it may need further diagnostics, navigational bronchoscopy, etc.  Please call if we can assist further   Labs   CBC: Recent Labs  Lab 09/25/22 2024 09/27/22 0548  WBC 8.0 10.5  NEUTROABS 6.8  --   HGB 13.6 13.7  HCT 45.0 44.8  MCV 88.9 88.7  PLT 420* 445*    Basic Metabolic Panel: Recent Labs  Lab 09/25/22 2024 09/27/22 0548  NA 137 138  K 3.6 4.3  CL 99 98  CO2 28 31  GLUCOSE 99 175*  BUN 23 22  CREATININE 0.85 0.73  CALCIUM 8.7* 9.1   GFR: Estimated Creatinine Clearance: 58.6 mL/min (by C-G formula based on SCr of 0.73 mg/dL). Recent Labs  Lab 09/25/22 2024 09/27/22 0548  WBC 8.0 10.5    Liver Function Tests: No results for input(s): "AST", "ALT", "ALKPHOS", "BILITOT", "PROT", "ALBUMIN" in the last 168 hours. No results for input(s): "LIPASE", "AMYLASE" in the last 168  hours. No results for input(s): "AMMONIA" in the last 168 hours.  ABG No results found for: "PHART", "PCO2ART", "PO2ART", "HCO3", "TCO2", "ACIDBASEDEF", "O2SAT"   Coagulation Profile: No results for input(s): "INR", "PROTIME" in the last 168 hours.  Cardiac Enzymes: No results for input(s): "CKTOTAL", "CKMB", "CKMBINDEX", "TROPONINI" in the last 168 hours.  HbA1C: Hgb A1c MFr Bld  Date/Time Value Ref Range Status  08/27/2019 05:07 PM 4.9 4.8 - 5.6 % Final    Comment:    (NOTE) Pre diabetes:          5.7%-6.4% Diabetes:              >6.4% Glycemic control for   <7.0% adults with diabetes   05/28/2013 05:45 AM 5.8 (H) <5.7 % Final    Comment:    (NOTE)                                                                       According to the ADA Clinical Practice Recommendations for 2011, when HbA1c is used as a screening test:  >=6.5%   Diagnostic of Diabetes Mellitus           (if abnormal result is confirmed) 5.7-6.4%   Increased risk of developing Diabetes Mellitus References:Diagnosis and Classification of Diabetes Mellitus,Diabetes Care,2011,34(Suppl 1):S62-S69 and Standards of Medical Care in         Diabetes - 2011,Diabetes Care,2011,34 (Suppl 1):S11-S61.    CBG: No results for input(s): "GLUCAP" in the last 168 hours.   Critical care time: NA      Levy Pupa, MD, PhD 09/27/2022, 12:05 PM [facility] [phone] or if no answer before 7:00PM call [phone] For any issues after 7:00PM please call eLink [phone]

## 2022-09-27 NOTE — Progress Notes (Signed)
Mobility Specialist - Progress Note   09/27/22 0938  Oxygen Therapy  SpO2 90 %  O2 Device Room Air  Mobility  Activity Ambulated with assistance in hallway  Level of Assistance Independent after set-up  Assistive Device None  Distance Ambulated (ft) 500 ft  Range of Motion/Exercises Active  Activity Response Tolerated well  Mobility Referral Yes  $Mobility charge 1 Mobility   Pt received in bed and agreed to mobility.  Pt was on room air resting at 90% SpO2.  During ambulation pt was at 87%, recovered in under 30 seconds to 91%.  Pt returned to chair with all needs met.  Marilynne Halsted Mobility Specialist

## 2022-09-27 NOTE — Progress Notes (Signed)
PROGRESS NOTE    JOHNELLA CRUMM  ZOX:[id number] DOB: January 07, 1941 DOA: 09/25/2022 PCP: Fatima Sanger, [name]   Brief Narrative:   82 y.o. 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 antibiotics and steroids.  She was started on IV steroids and antibiotics.  Assessment & Plan:   Possible COPD exacerbation Acute respiratory failure with hypoxia -Failed outpatient p.o. antibiotics and steroids.  Pulmonary following.  Currently on IV steroids and antibiotics.  Patient has history of right lower lobe abscess requiring treatment with extended course of Augmentin.  CT on admission was suggestive of chronic PE similar large-scale resolution of right lower lobe cavity with a 1.3 cm right lower lobe nodule -Feel slightly better but still has intermittent severe cough.  Continue nebs -Currently still on 3 L oxygen via nasal cannula.  Wean off as able.  History of pulmonary embolism Possible chronic DVT involving the left femoral vein -CTA chest as above.  Lower extremity duplex ultrasound showed possible chronic DVT involving the left femoral vein. -Continue Eliquis  Essential hypertension -Continue triamterene and hydrochlorothiazide.  Anxiety/depression-continue escitalopram and lorazepam  Right lower lobe lung nodule  -Pulmonary following.   DVT prophylaxis: Eliquis Code Status: Full Family Communication: None at bedside Disposition Plan: Status is: Inpatient Remains inpatient appropriate because: Of severity of illness    Consultants: Pulmonary  Procedures: None  Antimicrobials: Rocephin from 09/25/2022   Subjective: Patient seen and examined  at bedside.  Feels slightly better but still having intermittent severe cough.  No fever, vomiting, chest pain reported.  Objective: Vitals:   09/26/22 2200 09/27/22 0606 09/27/22 0814 09/27/22 0938  BP: (!) 140/84 (!) 148/88    Pulse: 84 68    Resp: 18 18    Temp: 98.1 F (36.7 C) 98.1 F (36.7 C)    TempSrc: Oral Oral    SpO2: 95% 96% 97% 90%  Weight:      Height:        Intake/Output Summary (Last 24 hours) at 09/27/2022 0951 Last data filed at 09/27/2022 0858 Gross per 24 hour  Intake 580 ml  Output --  Net 580 ml   Filed Weights   09/25/22 1818  Weight: 73.9 kg    Examination:  General exam: Appears calm and comfortable.  On 3 L oxygen via nasal cannula.  Elderly female lying in bed. Respiratory system: Bilateral decreased breath sounds at bases with scattered diffuse wheezing Cardiovascular system: S1 & S2 heard, Rate controlled Gastrointestinal system: Abdomen is nondistended, soft and nontender. Normal bowel sounds heard. Extremities: No cyanosis, clubbing, edema  Central nervous system: Alert and oriented. No focal neurological deficits. Moving extremities Skin: No rashes, lesions or ulcers Psychiatry: Judgement and insight appear normal. Mood & affect appropriate.     Data Reviewed: I have personally reviewed following labs and imaging studies  CBC: Recent Labs  Lab 09/25/22 2024 09/27/22 0548  WBC 8.0 10.5  NEUTROABS 6.8  --   HGB 13.6 13.7  HCT 45.0 44.8  MCV 88.9 88.7  PLT 420* 445*   Basic Metabolic Panel: Recent Labs  Lab 09/25/22 2024 09/27/22 0548  NA 137 138  K 3.6 4.3  CL 99 98  CO2 28 31  GLUCOSE 99 175*  BUN 23 22  CREATININE 0.85 0.73  CALCIUM 8.7* 9.1   GFR: Estimated Creatinine Clearance: 58.6 mL/min (by C-G formula based on SCr of 0.73 mg/dL). Liver Function Tests: No results for input(s): "AST", "ALT", "ALKPHOS", "BILITOT", "PROT", "ALBUMIN" in the last 168 hours. No results for input(s): "LIPASE", "AMYLASE" in the last  168 hours. No results for input(s): "AMMONIA" in the last 168 hours. Coagulation Profile: No results for input(s): "INR", "PROTIME" in the last 168 hours. Cardiac Enzymes: No results for input(s): "CKTOTAL", "CKMB", "CKMBINDEX", "TROPONINI" in the last 168 hours. BNP (last 3 results) No results for input(s): "PROBNP" in the last 8760 hours. HbA1C: No results for input(s): "HGBA1C" in the last 72 hours. CBG: No results for input(s): "GLUCAP" in the last 168 hours. Lipid Profile: No results for input(s): "CHOL", "HDL", "LDLCALC", "TRIG", "CHOLHDL", "LDLDIRECT" in the last 72 hours. Thyroid Function Tests: No results for input(s): "TSH", "T4TOTAL", "FREET4", "T3FREE", "THYROIDAB" in the last 72 hours. Anemia Panel: No results for input(s): "VITAMINB12", "FOLATE", "FERRITIN", "TIBC", "IRON", "RETICCTPCT" in the last 72 hours. Sepsis Labs: No results for input(s): "PROCALCITON", "LATICACIDVEN" in the last 168 hours.  Recent Results (from the past 240 hour(s))  COVID-19, Flu A+B and RSV     Status: None   Collection Time: 09/20/22 10:34 AM   Specimen: Nasopharyngeal(NP) swabs in vial transport medium   Nasopharynge  Previous  Result Value Ref Range Status   SARS-CoV-2, NAA Not Detected Not Detected Final   Influenza A, NAA Not Detected Not Detected Final   Influenza B, NAA Not Detected Not Detected Final   RSV, NAA Not Detected Not Detected Final   Test Information: Comment  Final    Comment: This nucleic acid amplification test was developed and its performance characteristics determined by [employer]. Nucleic acid amplification tests include RT-PCR and TMA. This test has not been FDA cleared or approved. This test has been authorized by FDA under an Emergency Use Authorization (EUA). This test is only authorized for the duration of time the declaration that circumstances exist justifying the authorization of the emergency use of in vitro diagnostic tests for detection of  SARS-CoV-2 virus and/or diagnosis of COVID-19 infection under section 564(b)(1) of the Act, 21 U.S.C. 147WGN-5(A) (1), unless the authorization is terminated or revoked sooner. When diagnostic testing is negative, the possibility of a false negative result should be considered in the context of a patient's recent exposures and the presence of clinical signs and symptoms consistent with COVID-19. An individual without symptoms of COVID-19 and who is not shedding SARS-CoV-2 virus wo uld expect to have a negative (not detected) result in this assay.   Respiratory (~20 pathogens) panel by PCR     Status: None   Collection Time: 09/26/22  8:16 PM   Specimen: Nasopharyngeal Swab; Respiratory  Result Value Ref Range Status   Adenovirus NOT DETECTED NOT DETECTED Final   Coronavirus 229E NOT DETECTED NOT DETECTED Final    Comment: (NOTE) The Coronavirus on the Respiratory Panel, DOES NOT test for the novel  Coronavirus (2019 nCoV)    Coronavirus HKU1 NOT DETECTED NOT DETECTED Final   Coronavirus NL63 NOT DETECTED NOT DETECTED Final   Coronavirus OC43 NOT DETECTED NOT DETECTED Final   Metapneumovirus NOT DETECTED NOT DETECTED Final   Rhinovirus / Enterovirus NOT DETECTED NOT DETECTED Final   Influenza A NOT DETECTED NOT DETECTED Final   Influenza B NOT DETECTED NOT DETECTED Final   Parainfluenza Virus 1 NOT DETECTED  NOT DETECTED Final   Parainfluenza Virus 2 NOT DETECTED NOT DETECTED Final   Parainfluenza Virus 3 NOT DETECTED NOT DETECTED Final   Parainfluenza Virus 4 NOT DETECTED NOT DETECTED Final   Respiratory Syncytial Virus NOT DETECTED NOT DETECTED Final   Bordetella pertussis NOT DETECTED NOT DETECTED Final   Bordetella Parapertussis NOT DETECTED NOT DETECTED Final   Chlamydophila pneumoniae NOT DETECTED NOT DETECTED Final   Mycoplasma pneumoniae NOT DETECTED NOT DETECTED Final    Comment: Performed at [facility], [address] [place], [place] [address]          Radiology Studies: VAS [place] LOWER EXTREMITY VENOUS (DVT)  Result Date: 09/26/2022  Lower Venous DVT Study Patient Name:  SNEHA WILLIG  Date of Exam:   09/26/2022 Medical Rec #: [id number]      Accession #:    [id number] Date of Birth: 1940-11-22     Patient Gender: F Patient Age:   74 years Exam Location:  [facility] Procedure:      VAS [place] LOWER EXTREMITY VENOUS (DVT) Referring Phys: Lyda Perone --------------------------------------------------------------------------------  Indications: Pulmonary embolism.  Risk Factors: Hx of multiple PEs and DVTs. Hx May Thurner's post stent placement of left iliac vein Anticoagulation: On Eliquis prior to admission. Comparison Study: Previous exam on 02/14/2022 was positive for DVT (BLE) Performing Technologist: Ernestene Mention RVT, RDMS  Examination Guidelines: A complete evaluation includes B-mode imaging, spectral Doppler, color Doppler, and power Doppler as needed of all accessible portions of each vessel. Bilateral testing is considered an integral part of a complete examination. Limited examinations for reoccurring indications may be performed as noted. The reflux portion of the exam is performed with the patient in reverse Trendelenburg.  +---------+---------------+---------+-----------+----------+--------------+ RIGHT    CompressibilityPhasicitySpontaneityPropertiesThrombus Aging +---------+---------------+---------+-----------+----------+--------------+ CFV      Full           Yes      Yes                                 +---------+---------------+---------+-----------+----------+--------------+ SFJ      Full                                                        +---------+---------------+---------+-----------+----------+--------------+ FV Prox  Full           Yes      Yes                                 +---------+---------------+---------+-----------+----------+--------------+ FV Mid   Full           Yes      Yes                                  +---------+---------------+---------+-----------+----------+--------------+ FV DistalFull           Yes      Yes                                 +---------+---------------+---------+-----------+----------+--------------+ PFV      Full                                                        +---------+---------------+---------+-----------+----------+--------------+  POP      Full           Yes      Yes                                 +---------+---------------+---------+-----------+----------+--------------+ PTV      Full                                                        +---------+---------------+---------+-----------+----------+--------------+ PERO     Full                                                        +---------+---------------+---------+-----------+----------+--------------+   +---------+---------------+---------+-----------+----------+-----------------+ LEFT     CompressibilityPhasicitySpontaneityPropertiesThrombus Aging    +---------+---------------+---------+-----------+----------+-----------------+ CFV      Full           Yes      Yes                                    +---------+---------------+---------+-----------+----------+-----------------+ SFJ      Full                                                           +---------+---------------+---------+-----------+----------+-----------------+ FV Prox  Partial        No       Yes                  Chronic           +---------+---------------+---------+-----------+----------+-----------------+ FV Mid   Partial        No       Yes                  Chronic           +---------+---------------+---------+-----------+----------+-----------------+ FV DistalPartial        Yes      Yes                  Chronic           +---------+---------------+---------+-----------+----------+-----------------+ PFV      Full                                                            +---------+---------------+---------+-----------+----------+-----------------+ POP      Partial        Yes      Yes                  Age Indeterminate +---------+---------------+---------+-----------+----------+-----------------+ PTV      Full                                                           +---------+---------------+---------+-----------+----------+-----------------+  PERO     Full                                                           +---------+---------------+---------+-----------+----------+-----------------+     Summary: BILATERAL: -No evidence of popliteal cyst, bilaterally. RIGHT: - There is no evidence of deep vein thrombosis in the lower extremity.  LEFT: - Findings consistent with age indeterminate deep vein thrombosis involving the left popliteal vein. - Findings consistent with chronic deep vein thrombosis involving the left femoral vein.  *See table(s) above for measurements and observations. Electronically signed by Waverly Ferrari MD on 09/26/2022 at 1:03:47 PM.    Final    CT Angio Chest Pulmonary Embolism (PE) W or WO Contrast  Result Date: 09/26/2022 CLINICAL DATA:  Coughing, shortness of breath and wheezing. EXAM: CT ANGIOGRAPHY CHEST WITH CONTRAST TECHNIQUE: Multidetector CT imaging of the chest was performed using the standard protocol during bolus administration of intravenous contrast. Multiplanar CT image reconstructions and MIPs were obtained to evaluate the vascular anatomy. RADIATION DOSE REDUCTION: This exam was performed according to the departmental dose-optimization program which includes automated exposure control, adjustment of the mA and/or kV according to patient size and/or use of iterative reconstruction technique. CONTRAST:  75mL OMNIPAQUE IOHEXOL 350 MG/ML SOLN COMPARISON:  PA and lateral chest yesterday, PA and lateral chest 09/20/2022 and 08/22/2022, and CTA chest 02/13/2022 FINDINGS: Cardiovascular: The cardiac  size is within normal limits. There is no pericardial effusion. There are scattered calcifications in the LAD and right coronary arteries. There is atherosclerosis in the aorta and great vessels without aneurysm, dissection or stenosis. The pulmonary trunk measures 3.2 cm indicating arterial hypertension. Central arteries are slightly prominent with the RV/LV ratio elevated to 1.26, previously 1.69. The pulmonary veins are decompressed. There previously were multiple bilateral segmental and subsegmental arterial emboli with moderate overall clot burden. Today the subsegmental arterial bed is largely obscured owing to breathing motion. There are small subsegmental filling defects in the infrahilar right lower lobe vessels on 4: 61 and 62 and segmental arterial filling defects in the right middle lobe on 4: 47-48. No other definitive embolic filling defects are seen and it is possible this could be chronic embolic disease as there was a greater amount of thrombus in these vessels previously. The visible clot burden is small but other subsegmental emboli could be missed given breathing motion. Mediastinum/Nodes: No enlarged mediastinal, hilar, or axillary lymph nodes. Thyroid gland, trachea, and esophagus demonstrate no significant findings. Lungs/Pleura: There are mild-to-moderate centrilobular emphysematous changes. Previously noted cavitary lesion in the superior segment of the right lower lobe has resolved. There previously was a 1 cm right lower lobe nodule just beneath this which has grown to 1.3 cm on 6:54 and should be further studied with PET-CT. There is stranding to the pleural surfaces which was seen previously. There are scattered linear scar-like opacities in both lung bases. Fissural atelectasis is again noted anteriorly in the right upper lobe. There is mild elevation right hemidiaphragm. No pleural effusion, thickening or pneumothorax. Upper Abdomen: Small hiatal hernia.  No acute findings.  Musculoskeletal: Bilateral silicone breast implants with intracapsular rupture are again noted. No other focal abnormality is seen in the chest wall. There is osteopenia with thoracic spondylosis. No acute or significant osseous findings. Review of the MIP images confirms  the above findings. IMPRESSION: 1. There are small subsegmental emboli in the right lower lobe and segmental filling defects right middle lobe. No other definitive emboli are seen. This could be chronic as there was a greater amount of thrombus in these vessels on the prior study. The visible clot burden is small but other subsegmental emboli could be missed due to breathing motion. 2. Slightly prominent central pulmonary arteries with RV/LV ratio elevated to 1.26, previously 1.69. This could be due to chronic pulmonary arterial hypertension. There was previously IVC and hepatic vein contrast reflux, which was not seen today. 3. Emphysema. 4. Aortic and coronary artery atherosclerosis. 5. Previously noted cavitary lesion in the superior segment of the right lower lobe has resolved, but there is a 1.3 cm right lower lobe nodule just below where the cavitary lesion was previously seen, which has grown from 1.0 cm. PET-CT is recommended. 6. Small hiatal hernia. 7. Bilateral silicone breast implants with intracapsular rupture. 8. Osteopenia and degenerative change. Aortic Atherosclerosis (ICD10-I70.0) and Emphysema (ICD10-J43.9). Electronically Signed   By: Almira Bar M.D.   On: 09/26/2022 06:31   DG Chest 2 View  Result Date: 09/25/2022 CLINICAL DATA:  Shortness of breath EXAM: CHEST - 2 VIEW COMPARISON:  09/20/2022 FINDINGS: The heart size and mediastinal contours are within normal limits. Both lungs are clear. The visualized skeletal structures are unremarkable. Prominent central pulmonary vessels suggesting arterial hypertension. IMPRESSION: No active cardiopulmonary disease. Prominent central pulmonary vessels suggesting arterial  hypertension. Electronically Signed   By: Jasmine Pang M.D.   On: 09/25/2022 20:08        Scheduled Meds:  apixaban  5 mg Oral BID   budesonide (PULMICORT) nebulizer solution  0.5 mg Nebulization BID   escitalopram  20 mg Oral Daily   ipratropium-albuterol  3 mL Nebulization TID   LORazepam  1 mg Oral QHS   methylPREDNISolone (SOLU-MEDROL) injection  40 mg Intravenous Q12H   pantoprazole  40 mg Oral Daily   traZODone  100 mg Oral QHS   triamterene-hydrochlorothiazide  0.5 tablet Oral Daily   Continuous Infusions:  cefTRIAXone (ROCEPHIN)  IV 1 g (09/27/22 0236)          Glade Lloyd, MD [facility] 09/27/2022, 9:51 AM

## 2022-09-27 NOTE — TOC Progression Note (Signed)
Transition of Care [facility]) - Progression Note    Patient Details  Name: RON JUNCO MRN: [id number] Date of Birth: 1941/01/10  Transition of Care [facility]) CM/SW Contact  Beckie Busing, RN Phone Number:[phone]  09/27/2022, 2:30 PM  Clinical Narrative:     Transition of Care [facility]) Screening Note   Patient Details  Name: PHELICIA DANTES Date of Birth: 01/23/1941   Transition of Care [facility]) CM/SW Contact:    Beckie Busing, RN Phone Number: 09/27/2022, 2:30 PM    Transition of Care Department [facility]) has reviewed patient and no TOC needs have been identified at this time. We will continue to monitor patient advancement through interdisciplinary progression rounds. If new patient transition needs arise, please place a TOC consult.          Expected Discharge Plan and Services                                               Social Determinants of Health (SDOH) Interventions SDOH Screenings   Food Insecurity: No Food Insecurity (09/26/2022)  Housing: Low Risk  (09/26/2022)  Transportation Needs: No Transportation Needs (09/26/2022)  Utilities: Not At Risk (09/26/2022)  Tobacco Use: Medium Risk (09/26/2022)    Readmission Risk Interventions     No data to display

## 2022-09-28 DIAGNOSIS — J441 Chronic obstructive pulmonary disease with (acute) exacerbation: Secondary | ICD-10-CM | POA: Diagnosis not present

## 2022-09-28 DIAGNOSIS — R9389 Abnormal findings on diagnostic imaging of other specified body structures: Secondary | ICD-10-CM

## 2022-09-28 DIAGNOSIS — Z86711 Personal history of pulmonary embolism: Secondary | ICD-10-CM | POA: Diagnosis not present

## 2022-09-28 MED ORDER — LEVOFLOXACIN 500 MG PO TABS
500.0000 mg | ORAL_TABLET | Freq: Every day | ORAL | Status: DC
  Administered 2022-09-28 – 2022-09-29 (×2): 500 mg via ORAL
  Filled 2022-09-28 (×2): qty 1

## 2022-09-28 MED ORDER — PREDNISONE 20 MG PO TABS
40.0000 mg | ORAL_TABLET | Freq: Every day | ORAL | Status: DC
  Administered 2022-09-29: 40 mg via ORAL
  Filled 2022-09-28: qty 2

## 2022-09-28 MED ORDER — LORATADINE 10 MG PO TABS
10.0000 mg | ORAL_TABLET | Freq: Every day | ORAL | Status: DC
  Administered 2022-09-28 – 2022-09-29 (×2): 10 mg via ORAL
  Filled 2022-09-28 (×2): qty 1

## 2022-09-28 MED ORDER — FLUTICASONE PROPIONATE 50 MCG/ACT NA SUSP
2.0000 | Freq: Every day | NASAL | Status: DC
  Administered 2022-09-28: 2 via NASAL
  Filled 2022-09-28: qty 16

## 2022-09-28 NOTE — Progress Notes (Signed)
NAME:  [name], MRN:  [id number], DOB:  Jul 26, 1940, LOS: 2 ADMISSION DATE:  09/25/2022, CONSULTATION DATE: 09/26/2022 REFERRING MD: Dr. Rito Ehrlich, CHIEF COMPLAINT: Dyspnea  History of Present Illness:  82 year old woman with a history of former tobacco use, COPD, DVT right lower lobe lung abscess (versus bullitis) treated with an extended course of Augmentin.  Multiple exacerbations that have required antibiotics and steroids since that time.  She is admitted now with an acute exacerbation that did not respond to outpatient prednisone and Keflex.  CT-PA done 4/30 showed small rounded right lower lobe lesion, consistent with residua from her treated lung abscess  Pertinent  Medical History   Past Medical History:  Diagnosis Date   Allergic rhinitis    Anemia due to GI blood loss    Anxiety    Asthma    Chronic airway obstruction, not elsewhere classified    Depressive disorder, not elsewhere classified    DVT (deep venous thrombosis) (HCC)    LLE DVT 01/13/19   Dyspnea    Esophageal reflux    Essential hypertension 08/27/2019   PE (pulmonary thromboembolism) (HCC) 01/29/2019   Pneumonia    Wears partial dentures     Significant Hospital Events: Including procedures, antibiotic start and stop dates in addition to other pertinent events   CT-PA 09/26/22 > no pulmonary embolism, small subsegmental filling defects consistent with PE, suspect chronic clot.  No mediastinal or hilar adenopathy.  This centrilobular emphysema.  Large-scale resolution of right lower lobe cavity, suspect now just 1.3 cm rounded fluid-filled cavity  Interim History / Subjective:  Complaining that she still has some wheezing, sees desaturations when she walks (chronic).  Some yellow mucus  Objective   Blood pressure (!) 150/80, pulse 77, temperature 98.3 F (36.8 C), resp. rate 18, height 5\' 6"  (1.676 m), weight 73.9 kg, SpO2 95 %.    FiO2 (%):  [24 %] 24 %   Intake/Output Summary (Last 24 hours) at  09/28/2022 1021 Last data filed at 09/27/2022 1834 Gross per 24 hour  Intake 480 ml  Output --  Net 480 ml   Filed Weights   09/25/22 1818  Weight: 73.9 kg    Examination: General: Sitting up at bedside, no distress HENT: Strong voice.  She has upper airway inspiratory and expiratory stridor Lungs: Good air movement.  She has referred upper airway noise but no overt wheezing Cardiovascular: Distant, regular, no murmur Abdomen: Benign, positive bowel sounds Extremities: No edema Neuro: Awake,, appropriate, nonfocal GU: Deferred  Resolved Hospital Problem list     Assessment & Plan:  Acute exacerbation of COPD, slow to resolve, failed outpatient prednisone and Keflex.  Question whether small residual area of right lower lobe abscess is driving this flare, caused her to require admission.  Now improved but with residual upper airway noise -Transition ceftriaxone to levofloxacin and finish a 2-week course of total antibiotics -Change her Solu-Medrol to prednisone and plan for standard taper -Continue bronchodilators as ordered, her home regimen is Breztri -Add loratadine, fluticasone nasal spray as she does have some nasal congestion and drainage, likely contributing to her upper airway irritation -Continue her pantoprazole, consider increasing if her upper airway noise continues (she is on omeprazole at home) -Defer her planned repeat CT chest that was originally scheduled for 5/3.  She will need a repeat CT scan of the chest in 6 to 8 weeks to follow the right lower lobe nodular opacity.  I suspect that this is the resolving right lower lobe  abscess/bullitis.  If it persists then it may need further diagnostics, navigational bronchoscopy, etc. -She has a follow-up visit in our office on 5/28 with T. Parrett.    Labs   CBC: Recent Labs  Lab 09/25/22 2024 09/27/22 0548  WBC 8.0 10.5  NEUTROABS 6.8  --   HGB 13.6 13.7  HCT 45.0 44.8  MCV 88.9 88.7  PLT 420* 445*    Basic  Metabolic Panel: Recent Labs  Lab 09/25/22 2024 09/27/22 0548  NA 137 138  K 3.6 4.3  CL 99 98  CO2 28 31  GLUCOSE 99 175*  BUN 23 22  CREATININE 0.85 0.73  CALCIUM 8.7* 9.1   GFR: Estimated Creatinine Clearance: 58.6 mL/min (by C-G formula based on SCr of 0.73 mg/dL). Recent Labs  Lab 09/25/22 2024 09/27/22 0548  WBC 8.0 10.5    Liver Function Tests: No results for input(s): "AST", "ALT", "ALKPHOS", "BILITOT", "PROT", "ALBUMIN" in the last 168 hours. No results for input(s): "LIPASE", "AMYLASE" in the last 168 hours. No results for input(s): "AMMONIA" in the last 168 hours.  ABG No results found for: "PHART", "PCO2ART", "PO2ART", "HCO3", "TCO2", "ACIDBASEDEF", "O2SAT"   Coagulation Profile: No results for input(s): "INR", "PROTIME" in the last 168 hours.  Cardiac Enzymes: No results for input(s): "CKTOTAL", "CKMB", "CKMBINDEX", "TROPONINI" in the last 168 hours.  HbA1C: Hgb A1c MFr Bld  Date/Time Value Ref Range Status  08/27/2019 05:07 PM 4.9 4.8 - 5.6 % Final    Comment:    (NOTE) Pre diabetes:          5.7%-6.4% Diabetes:              >6.4% Glycemic control for   <7.0% adults with diabetes   05/28/2013 05:45 AM 5.8 (H) <5.7 % Final    Comment:    (NOTE)                                                                       According to the ADA Clinical Practice Recommendations for 2011, when HbA1c is used as a screening test:  >=6.5%   Diagnostic of Diabetes Mellitus           (if abnormal result is confirmed) 5.7-6.4%   Increased risk of developing Diabetes Mellitus References:Diagnosis and Classification of Diabetes Mellitus,Diabetes Care,2011,34(Suppl 1):S62-S69 and Standards of Medical Care in         Diabetes - 2011,Diabetes Care,2011,34 (Suppl 1):S11-S61.    CBG: No results for input(s): "GLUCAP" in the last 168 hours.   Critical care time: NA      Levy Pupa, MD, PhD 09/28/2022, 10:21 AM [facility] [phone] or if no answer before 7:00PM call [phone] For any issues after 7:00PM please call eLink [phone]

## 2022-09-28 NOTE — Progress Notes (Signed)
PROGRESS NOTE    [name]  EAV:[id number] DOB: 1941-05-09 DOA: 09/25/2022 PCP: Fatima Sanger, [name]   Brief Narrative:   82 y.o. 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 antibiotics and steroids.  She was started on IV steroids and antibiotics.  Assessment & Plan:   Possible COPD exacerbation Acute respiratory failure with hypoxia -Failed outpatient p.o. antibiotics and steroids.  Pulmonary following.  Currently on IV steroids and antibiotics.  Still wheezing significantly: Will keep her on the same for today.  Patient has history of right lower lobe abscess requiring treatment with extended course of Augmentin.  CT on admission was suggestive of chronic PE similar large-scale resolution of right lower lobe cavity with a 1.3 cm right lower lobe nodule -Feel slightly better but still has intermittent severe cough.  Continue nebs -Currently still on 3 L oxygen via nasal cannula.  Wean off as able.  History of pulmonary embolism Possible chronic DVT involving the left femoral vein -CTA chest as above.  Lower extremity duplex ultrasound showed possible chronic DVT involving the left femoral vein. -Continue Eliquis  Essential hypertension -Continue triamterene and hydrochlorothiazide.  Anxiety/depression-continue escitalopram and lorazepam  Right lower lobe lung nodule  -Pulmonary following.   DVT prophylaxis: Eliquis Code Status: Full Family Communication: None at bedside Disposition Plan: Status is: Inpatient Remains inpatient appropriate because: Of severity of illness    Consultants: Pulmonary  Procedures: None  Antimicrobials:  Rocephin from 09/25/2022   Subjective: Patient seen and examined at bedside.  Still complains of severe intermittent cough.  Does not feel ready to go home today.  No fever, chest pain, vomiting reported.  Objective: Vitals:   09/27/22 2213 09/28/22 0615 09/28/22 0902 09/28/22 0925  BP: 137/73 (!) 160/112  (!) 150/80  Pulse: 75 77    Resp: 18 18    Temp: 98.2 F (36.8 C) 98.3 F (36.8 C)    TempSrc:      SpO2: 93% 95% 95%   Weight:      Height:        Intake/Output Summary (Last 24 hours) at 09/28/2022 0954 Last data filed at 09/27/2022 1834 Gross per 24 hour  Intake 480 ml  Output --  Net 480 ml    Filed Weights   09/25/22 1818  Weight: 73.9 kg    Examination:  General: On 3 L oxygen via nasal cannula.  No distress.  Elderly female sitting in bed. respiratory: Decreased breath sounds at bases bilaterally with some crackles and diffuse wheezing. CVS: Currently rate controlled; S1-S2 heard  abdominal: Soft, nontender, slightly distended, no organomegaly; bowel sounds are heard  extremities: Trace lower extremity edema; no clubbing.        Data Reviewed: I have personally reviewed following labs and imaging studies  CBC: Recent Labs  Lab 09/25/22 2024 09/27/22 0548  WBC 8.0 10.5  NEUTROABS 6.8  --   HGB 13.6 13.7  HCT 45.0 44.8  MCV 88.9 88.7  PLT 420* 445*    Basic Metabolic Panel: Recent Labs  Lab 09/25/22 2024 09/27/22 0548  NA 137 138  K 3.6 4.3  CL 99 98  CO2 28 31  GLUCOSE 99 175*  BUN 23 22  CREATININE 0.85 0.73  CALCIUM 8.7* 9.1    GFR: Estimated Creatinine Clearance: 58.6 mL/min (by C-G formula based on SCr of 0.73 mg/dL). Liver Function Tests: No results for input(s): "AST", "ALT", "ALKPHOS", "BILITOT", "PROT", "ALBUMIN" in the last 168 hours. No results for input(s): "LIPASE", "AMYLASE" in the last 168 hours. No results for input(s): "AMMONIA" in the last 168 hours. Coagulation Profile: No results for input(s): "INR", "PROTIME" in  the last 168 hours. Cardiac Enzymes: No results for input(s): "CKTOTAL", "CKMB", "CKMBINDEX", "TROPONINI" in the last 168 hours. BNP (last 3 results) No results for input(s): "PROBNP" in the last 8760 hours. HbA1C: No results for input(s): "HGBA1C" in the last 72 hours. CBG: No results for input(s): "GLUCAP" in the last 168 hours. Lipid Profile: No results for input(s): "CHOL", "HDL", "LDLCALC", "TRIG", "CHOLHDL", "LDLDIRECT" in the last 72 hours. Thyroid Function Tests: No results for input(s): "TSH", "T4TOTAL", "FREET4", "T3FREE", "THYROIDAB" in the last 72 hours. Anemia Panel: No results for input(s): "VITAMINB12", "FOLATE", "FERRITIN", "TIBC", "IRON", "RETICCTPCT" in the last 72 hours. Sepsis Labs: No results for input(s): "PROCALCITON", "LATICACIDVEN" in the last 168 hours.  Recent Results (from the past 240 hour(s))  COVID-19, Flu A+B and RSV     Status: None   Collection Time: 09/20/22 10:34 AM   Specimen: Nasopharyngeal(NP) swabs in vial transport medium   Nasopharynge  Previous  Result Value Ref Range Status   SARS-CoV-2, NAA Not Detected Not Detected Final   Influenza A, NAA Not Detected Not Detected Final   Influenza B, NAA Not Detected Not Detected Final   RSV, NAA Not Detected Not Detected Final   Test Information: Comment  Final    Comment: This nucleic acid amplification test was developed and its performance characteristics determined by [employer]. Nucleic acid amplification tests include RT-PCR and TMA. This test has not been FDA cleared or approved. This test has been authorized by FDA under an Emergency Use Authorization (EUA). This test is only authorized for the duration of time the declaration that circumstances exist justifying the authorization of the emergency use of in vitro diagnostic tests for detection of SARS-CoV-2 virus and/or diagnosis of COVID-19 infection under section 564(b)(1) of the Act, 21 U.S.C. 696EXB-2(W) (1), unless the  authorization is terminated or revoked sooner. When diagnostic testing is negative, the possibility of a false negative result should be considered in the context of a patient's recent exposures and the presence of clinical signs and symptoms consistent with COVID-19. An individual without symptoms of COVID-19 and who is not shedding SARS-CoV-2 virus wo uld expect to have a negative (not detected) result in this assay.   Respiratory (~20 pathogens) panel by PCR     Status: None   Collection Time: 09/26/22  8:16 PM   Specimen: Nasopharyngeal Swab; Respiratory  Result Value Ref Range Status   Adenovirus NOT DETECTED NOT DETECTED Final   Coronavirus 229E NOT DETECTED NOT DETECTED Final    Comment: (NOTE) The Coronavirus on the Respiratory Panel, DOES NOT test for the novel  Coronavirus (2019 nCoV)    Coronavirus HKU1 NOT DETECTED NOT DETECTED Final   Coronavirus NL63 NOT DETECTED NOT DETECTED Final   Coronavirus OC43 NOT DETECTED NOT DETECTED Final   Metapneumovirus NOT DETECTED NOT DETECTED Final   Rhinovirus / Enterovirus NOT DETECTED NOT DETECTED Final   Influenza A NOT DETECTED NOT DETECTED Final   Influenza B NOT DETECTED NOT DETECTED Final   Parainfluenza Virus 1 NOT DETECTED NOT DETECTED Final   Parainfluenza Virus  2 NOT DETECTED NOT DETECTED Final   Parainfluenza Virus 3 NOT DETECTED NOT DETECTED Final   Parainfluenza Virus 4 NOT DETECTED NOT DETECTED Final   Respiratory Syncytial Virus NOT DETECTED NOT DETECTED Final   Bordetella pertussis NOT DETECTED NOT DETECTED Final   Bordetella Parapertussis NOT DETECTED NOT DETECTED Final   Chlamydophila pneumoniae NOT DETECTED NOT DETECTED Final   Mycoplasma pneumoniae NOT DETECTED NOT DETECTED Final    Comment: Performed at [facility], [address] [place], [place] [address]         Radiology Studies: No results found.      Scheduled Meds:  apixaban  5 mg Oral BID   budesonide (PULMICORT) nebulizer solution   0.5 mg Nebulization BID   escitalopram  20 mg Oral Daily   ipratropium-albuterol  3 mL Nebulization TID   LORazepam  1 mg Oral QHS   methylPREDNISolone (SOLU-MEDROL) injection  40 mg Intravenous Q12H   pantoprazole  40 mg Oral Daily   traZODone  100 mg Oral QHS   triamterene-hydrochlorothiazide  0.5 tablet Oral Daily   Continuous Infusions:  cefTRIAXone (ROCEPHIN)  IV 1 g (09/28/22 0226)          Glade Lloyd, MD [facility] 09/28/2022, 9:54 AM

## 2022-09-29 ENCOUNTER — Ambulatory Visit (HOSPITAL_COMMUNITY): Payer: PPO | Attending: Registered Nurse

## 2022-09-29 DIAGNOSIS — J441 Chronic obstructive pulmonary disease with (acute) exacerbation: Secondary | ICD-10-CM | POA: Diagnosis not present

## 2022-09-29 DIAGNOSIS — I10 Essential (primary) hypertension: Secondary | ICD-10-CM | POA: Diagnosis not present

## 2022-09-29 DIAGNOSIS — Z86711 Personal history of pulmonary embolism: Secondary | ICD-10-CM | POA: Diagnosis not present

## 2022-09-29 MED ORDER — LORATADINE 10 MG PO TABS: 10.0000 mg | ORAL_TABLET | Freq: Every day | ORAL | 0 refills | Status: DC

## 2022-09-29 MED ORDER — FLUTICASONE PROPIONATE 50 MCG/ACT NA SUSP: 2.0000 | Freq: Every day | NASAL | 0 refills | Status: DC

## 2022-09-29 MED ORDER — LEVOFLOXACIN 500 MG PO TABS: 500.0000 mg | ORAL_TABLET | Freq: Every day | ORAL | 0 refills | Status: AC

## 2022-09-29 MED ORDER — PANTOPRAZOLE SODIUM 40 MG PO TBEC: 40.0000 mg | DELAYED_RELEASE_TABLET | Freq: Every day | ORAL | 0 refills | Status: DC

## 2022-09-29 MED ORDER — PREDNISONE 10 MG PO TABS: ORAL_TABLET | ORAL | 0 refills | Status: DC

## 2022-09-29 MED ORDER — GUAIFENESIN-DM 100-10 MG/5ML PO SYRP: 10.0000 mL | ORAL_SOLUTION | ORAL | 1 refills | Status: DC | PRN

## 2022-09-29 NOTE — Care Management Important Message (Signed)
Important Message  Patient Details IM Letter given. Name: [name] MRN: [id number] Date of Birth: [birth year]   Medicare Important Message Given:  Yes     Jenyah, Tobiason 09/29/2022, 9:21 AM

## 2022-09-29 NOTE — Progress Notes (Signed)
SATURATION QUALIFICATIONS: (This note is used to comply with regulatory documentation for home oxygen)  Patient Saturations on Room Air at Rest = 94%  Patient Saturations on Room Air while Ambulating = 90%  Patient Saturations on 0 Liters of oxygen while Ambulating = 90%  Please briefly explain why patient needs home oxygen:patient is above 90% saturation

## 2022-09-29 NOTE — Discharge Summary (Signed)
Physician Discharge Summary  [name] UJW:[id number] DOB: May 22, 1941 DOA: 09/25/2022  PCP: Fatima Sanger, [name]  Admit date: 09/25/2022 Discharge date: 09/29/2022  Admitted From: Home Disposition: Home  Recommendations for Outpatient Follow-up:  Follow up with PCP in 1 week with repeat CBC/BMP Outpatient follow-up with pulmonary Follow up in ED if symptoms worsen or new appear   Home Health: No Equipment/Devices: None  Discharge Condition: Stable CODE STATUS: Full Diet recommendation: Heart healthy  Brief/Interim Summary: 82 y.o. 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 antibiotics and steroids.  She was started on IV steroids and antibiotics.  During the hospitalization, her condition has slightly improved.  Pulmonary has followed the patient during the hospitalization and has switched her to oral prednisone and Levaquin and has cleared her for discharge.  She will be discharged home today with outpatient follow-up with pulmonary.  Discharge Diagnoses:   Possible COPD exacerbation Acute respiratory failure with hypoxia -Failed outpatient p.o. antibiotics and steroids.  Treated with IV steroids and antibiotics.  Patient has history of right lower lobe abscess requiring treatment with extended course of Augmentin.  CT on admission was suggestive of chronic PE similar large-scale resolution of right lower lobe cavity with a 1.3 cm right lower lobe nodule -Respiratory status has improved.  Currently on room air.  Still has intermittent cough but improving.   -Pulmonary has followed the patient during the hospitalization and has switched her to oral  prednisone and Levaquin and has cleared her for discharge.  She will be discharged home today on tapering prednisone along with oral Levaquin (total 2 weeks course of antibiotics as per pulmonary) with outpatient follow-up with pulmonary.  History of pulmonary embolism Possible chronic DVT involving the left femoral vein -CTA chest as above.  Lower extremity duplex ultrasound showed possible chronic DVT involving the left femoral vein. -Continue Eliquis   Essential hypertension -Continue triamterene and hydrochlorothiazide.   Anxiety/depression-continue escitalopram and lorazepam   Right lower lobe lung nodule  -Pulmonary following.  Outpatient follow-up with pulmonary  Discharge Instructions  Discharge Instructions     Ambulatory referral to Pulmonology   Complete by: As directed    Reason for referral: Asthma/COPD   Diet - low sodium heart healthy   Complete by: As directed    Increase activity slowly   Complete by: As directed       Allergies as of 09/29/2022       Reactions   Pneumococcal Vaccines Shortness Of Breath, Other (See Comments)   Couldn't breathe   Clarithromycin Swelling   Estradiol Rash   Reaction to cream   Sulfa Antibiotics Nausea And Vomiting, Swelling   Doxycycline Hyclate Other (See Comments)   stomach distress   Pneumococcal Polysaccharide Vaccine Other (See Comments)   Unknown   Sulfacetamide Sodium-sulfur Other (See Comments)   Sulfonamide Derivatives Swelling        Medication List     STOP taking these medications    amoxicillin-clavulanate 875-125 MG tablet Commonly known as: AUGMENTIN   omeprazole 20 MG capsule Commonly known as: PRILOSEC Replaced by: pantoprazole 40 MG tablet       TAKE these medications    acetaminophen 500 MG tablet Commonly known  as: TYLENOL Take 500 mg by mouth every 6 (six) hours as needed for headache (pain).   albuterol 108 (90 Base) MCG/ACT inhaler Commonly known as: VENTOLIN HFA Inhale 1-2  puffs into the lungs every 6 (six) hours as needed for wheezing or shortness of breath.   albuterol (2.5 MG/3ML) 0.083% nebulizer solution Commonly known as: PROVENTIL Take 3 mLs (2.5 mg total) by nebulization every 6 (six) hours as needed for wheezing or shortness of breath.   Breztri Aerosphere 160-9-4.8 MCG/ACT Aero Generic drug: Budeson-Glycopyrrol-Formoterol Inhale 2 puffs into the lungs in the morning and at bedtime.   Eliquis 5 MG Tabs tablet Generic drug: apixaban Take 5 mg by mouth 2 (two) times daily.   escitalopram 20 MG tablet Commonly known as: LEXAPRO Take 20 mg by mouth daily.   fluticasone 50 MCG/ACT nasal spray Commonly known as: FLONASE Place 2 sprays into both nostrils daily.   furosemide 20 MG tablet Commonly known as: LASIX Take 20 mg by mouth daily as needed for fluid or edema. Takes when having swelling.   guaiFENesin-dextromethorphan 100-10 MG/5ML syrup Commonly known as: ROBITUSSIN DM Take 10 mLs by mouth every 4 (four) hours as needed for cough.   levofloxacin 500 MG tablet Commonly known as: LEVAQUIN Take 1 tablet (500 mg total) by mouth daily for 10 days.   loratadine 10 MG tablet Commonly known as: CLARITIN Take 1 tablet (10 mg total) by mouth daily.   LORazepam 1 MG tablet Commonly known as: ATIVAN Take 1 mg by mouth at bedtime.   pantoprazole 40 MG tablet Commonly known as: PROTONIX Take 1 tablet (40 mg total) by mouth daily. Replaces: omeprazole 20 MG capsule   predniSONE 10 MG tablet Commonly known as: DELTASONE Take 4 tabs by mouth for 5 days, then 3 for 5 days, 2 for 5 days, 1 for 5 days and stop What changed: additional instructions   traZODone 100 MG tablet Commonly known as: DESYREL Take 100 mg by mouth at bedtime.   triamterene-hydrochlorothiazide 37.5-25 MG tablet Commonly known as: MAXZIDE-25 Take 0.5 tablets by mouth daily.        Follow-up Information     Fatima Sanger, [name]. Schedule an appointment as  soon as possible for a visit in 1 week(s).   Specialty: Internal Medicine Contact information: [address] [place] [address] [phone]                Allergies  Allergen Reactions   Pneumococcal Vaccines Shortness Of Breath and Other (See Comments)    Couldn't breathe    Clarithromycin Swelling   Estradiol Rash    Reaction to cream   Sulfa Antibiotics Nausea And Vomiting and Swelling   Doxycycline Hyclate Other (See Comments)    stomach distress   Pneumococcal Polysaccharide Vaccine Other (See Comments)    Unknown   Sulfacetamide Sodium-Sulfur Other (See Comments)   Sulfonamide Derivatives Swelling    Consultations: Pulmonary   Procedures/Studies: VAS [place] LOWER EXTREMITY VENOUS (DVT)  Result Date: 09/26/2022  Lower Venous DVT Study Patient Name:  ALEXEA GARRAHAN  Date of Exam:   09/26/2022 Medical Rec #: [id number]      Accession #:    [id number] Date of Birth: 09-Sep-1940     Patient Gender: F Patient Age:   37 years Exam Location:  [facility] Procedure:      VAS [place] LOWER EXTREMITY VENOUS (DVT) Referring Phys: Lyda Perone --------------------------------------------------------------------------------  Indications: Pulmonary embolism.  Risk Factors: Hx of multiple  PEs and DVTs. Hx May Thurner's post stent placement of left iliac vein Anticoagulation: On Eliquis prior to admission. Comparison Study: Previous exam on 02/14/2022 was positive for DVT (BLE) Performing Technologist: Ernestene Mention RVT, RDMS  Examination Guidelines: A complete evaluation includes B-mode imaging, spectral Doppler, color Doppler, and power Doppler as needed of all accessible portions of each vessel. Bilateral testing is considered an integral part of a complete examination. Limited examinations for reoccurring indications may be performed as noted. The reflux portion of the exam is performed with the patient in reverse Trendelenburg.   +---------+---------------+---------+-----------+----------+--------------+ RIGHT    CompressibilityPhasicitySpontaneityPropertiesThrombus Aging +---------+---------------+---------+-----------+----------+--------------+ CFV      Full           Yes      Yes                                 +---------+---------------+---------+-----------+----------+--------------+ SFJ      Full                                                        +---------+---------------+---------+-----------+----------+--------------+ FV Prox  Full           Yes      Yes                                 +---------+---------------+---------+-----------+----------+--------------+ FV Mid   Full           Yes      Yes                                 +---------+---------------+---------+-----------+----------+--------------+ FV DistalFull           Yes      Yes                                 +---------+---------------+---------+-----------+----------+--------------+ PFV      Full                                                        +---------+---------------+---------+-----------+----------+--------------+ POP      Full           Yes      Yes                                 +---------+---------------+---------+-----------+----------+--------------+ PTV      Full                                                        +---------+---------------+---------+-----------+----------+--------------+ PERO     Full                                                        +---------+---------------+---------+-----------+----------+--------------+   +---------+---------------+---------+-----------+----------+-----------------+  LEFT     CompressibilityPhasicitySpontaneityPropertiesThrombus Aging    +---------+---------------+---------+-----------+----------+-----------------+ CFV      Full           Yes      Yes                                     +---------+---------------+---------+-----------+----------+-----------------+ SFJ      Full                                                           +---------+---------------+---------+-----------+----------+-----------------+ FV Prox  Partial        No       Yes                  Chronic           +---------+---------------+---------+-----------+----------+-----------------+ FV Mid   Partial        No       Yes                  Chronic           +---------+---------------+---------+-----------+----------+-----------------+ FV DistalPartial        Yes      Yes                  Chronic           +---------+---------------+---------+-----------+----------+-----------------+ PFV      Full                                                           +---------+---------------+---------+-----------+----------+-----------------+ POP      Partial        Yes      Yes                  Age Indeterminate +---------+---------------+---------+-----------+----------+-----------------+ PTV      Full                                                           +---------+---------------+---------+-----------+----------+-----------------+ PERO     Full                                                           +---------+---------------+---------+-----------+----------+-----------------+     Summary: BILATERAL: -No evidence of popliteal cyst, bilaterally. RIGHT: - There is no evidence of deep vein thrombosis in the lower extremity.  LEFT: - Findings consistent with age indeterminate deep vein thrombosis involving the left popliteal vein. - Findings consistent with chronic deep vein thrombosis involving the left femoral vein.  *See table(s) above for measurements and observations. Electronically signed by Waverly Ferrari MD on 09/26/2022 at 1:03:47 PM.    Final    CT  Angio Chest Pulmonary Embolism (PE) W or WO Contrast  Result Date: 09/26/2022 CLINICAL DATA:  Coughing,  shortness of breath and wheezing. EXAM: CT ANGIOGRAPHY CHEST WITH CONTRAST TECHNIQUE: Multidetector CT imaging of the chest was performed using the standard protocol during bolus administration of intravenous contrast. Multiplanar CT image reconstructions and MIPs were obtained to evaluate the vascular anatomy. RADIATION DOSE REDUCTION: This exam was performed according to the departmental dose-optimization program which includes automated exposure control, adjustment of the mA and/or kV according to patient size and/or use of iterative reconstruction technique. CONTRAST:  75mL OMNIPAQUE IOHEXOL 350 MG/ML SOLN COMPARISON:  PA and lateral chest yesterday, PA and lateral chest 09/20/2022 and 08/22/2022, and CTA chest 02/13/2022 FINDINGS: Cardiovascular: The cardiac size is within normal limits. There is no pericardial effusion. There are scattered calcifications in the LAD and right coronary arteries. There is atherosclerosis in the aorta and great vessels without aneurysm, dissection or stenosis. The pulmonary trunk measures 3.2 cm indicating arterial hypertension. Central arteries are slightly prominent with the RV/LV ratio elevated to 1.26, previously 1.69. The pulmonary veins are decompressed. There previously were multiple bilateral segmental and subsegmental arterial emboli with moderate overall clot burden. Today the subsegmental arterial bed is largely obscured owing to breathing motion. There are small subsegmental filling defects in the infrahilar right lower lobe vessels on 4: 61 and 62 and segmental arterial filling defects in the right middle lobe on 4: 47-48. No other definitive embolic filling defects are seen and it is possible this could be chronic embolic disease as there was a greater amount of thrombus in these vessels previously. The visible clot burden is small but other subsegmental emboli could be missed given breathing motion. Mediastinum/Nodes: No enlarged mediastinal, hilar, or axillary  lymph nodes. Thyroid gland, trachea, and esophagus demonstrate no significant findings. Lungs/Pleura: There are mild-to-moderate centrilobular emphysematous changes. Previously noted cavitary lesion in the superior segment of the right lower lobe has resolved. There previously was a 1 cm right lower lobe nodule just beneath this which has grown to 1.3 cm on 6:54 and should be further studied with PET-CT. There is stranding to the pleural surfaces which was seen previously. There are scattered linear scar-like opacities in both lung bases. Fissural atelectasis is again noted anteriorly in the right upper lobe. There is mild elevation right hemidiaphragm. No pleural effusion, thickening or pneumothorax. Upper Abdomen: Small hiatal hernia.  No acute findings. Musculoskeletal: Bilateral silicone breast implants with intracapsular rupture are again noted. No other focal abnormality is seen in the chest wall. There is osteopenia with thoracic spondylosis. No acute or significant osseous findings. Review of the MIP images confirms the above findings. IMPRESSION: 1. There are small subsegmental emboli in the right lower lobe and segmental filling defects right middle lobe. No other definitive emboli are seen. This could be chronic as there was a greater amount of thrombus in these vessels on the prior study. The visible clot burden is small but other subsegmental emboli could be missed due to breathing motion. 2. Slightly prominent central pulmonary arteries with RV/LV ratio elevated to 1.26, previously 1.69. This could be due to chronic pulmonary arterial hypertension. There was previously IVC and hepatic vein contrast reflux, which was not seen today. 3. Emphysema. 4. Aortic and coronary artery atherosclerosis. 5. Previously noted cavitary lesion in the superior segment of the right lower lobe has resolved, but there is a 1.3 cm right lower lobe nodule just below where the cavitary lesion was previously seen, which has  grown from 1.0 cm. PET-CT is recommended. 6. Small hiatal hernia. 7. Bilateral silicone breast implants with intracapsular rupture. 8. Osteopenia and degenerative change. Aortic Atherosclerosis (ICD10-I70.0) and Emphysema (ICD10-J43.9). Electronically Signed   By: Almira Bar M.D.   On: 09/26/2022 06:31   DG Chest 2 View  Result Date: 09/25/2022 CLINICAL DATA:  Shortness of breath EXAM: CHEST - 2 VIEW COMPARISON:  09/20/2022 FINDINGS: The heart size and mediastinal contours are within normal limits. Both lungs are clear. The visualized skeletal structures are unremarkable. Prominent central pulmonary vessels suggesting arterial hypertension. IMPRESSION: No active cardiopulmonary disease. Prominent central pulmonary vessels suggesting arterial hypertension. Electronically Signed   By: Jasmine Pang M.D.   On: 09/25/2022 20:08   DG Chest 2 View  Result Date: 09/23/2022 CLINICAL DATA:  Dyspnea EXAM: CHEST - 2 VIEW COMPARISON:  08/22/2022 FINDINGS: The heart size and mediastinal contours are within normal limits. Linear perihilar and infrahilar opacities bilaterally consistent with subsegmental atelectasis or minimal consolidation. Lungs are otherwise clear. The visualized skeletal structures are unremarkable. IMPRESSION: Bilateral mid lung subsegmental atelectasis or minimal consolidation. Electronically Signed   By: Layla Maw M.D.   On: 09/23/2022 23:04      Subjective: Patient seen and examined at bedside.  She feels better and feels okay to go home today.  Still complains of intermittent cough and shortness of breath.  No fever or vomiting reported.  Discharge Exam: Vitals:   09/29/22 0453 09/29/22 0650  BP: (!) 151/90   Pulse: 69   Resp: 17   Temp: 98.4 F (36.9 C)   SpO2: 91% 94%    General: Pt is alert, awake, not in acute distress.  On room air.  Looks chronically ill and deconditioned. Cardiovascular: rate controlled, S1/S2 + Respiratory: bilateral decreased breath sounds  at bases with mild wheezing Abdominal: Soft, NT, ND, bowel sounds + Extremities: Trace lower extremity edema; no cyanosis    The results of significant diagnostics from this hospitalization (including imaging, microbiology, ancillary and laboratory) are listed below for reference.     Microbiology: Recent Results (from the past 240 hour(s))  COVID-19, Flu A+B and RSV     Status: None   Collection Time: 09/20/22 10:34 AM   Specimen: Nasopharyngeal(NP) swabs in vial transport medium   Nasopharynge  Previous  Result Value Ref Range Status   SARS-CoV-2, NAA Not Detected Not Detected Final   Influenza A, NAA Not Detected Not Detected Final   Influenza B, NAA Not Detected Not Detected Final   RSV, NAA Not Detected Not Detected Final   Test Information: Comment  Final    Comment: This nucleic acid amplification test was developed and its performance characteristics determined by [employer]. Nucleic acid amplification tests include RT-PCR and TMA. This test has not been FDA cleared or approved. This test has been authorized by FDA under an Emergency Use Authorization (EUA). This test is only authorized for the duration of time the declaration that circumstances exist justifying the authorization of the emergency use of in vitro diagnostic tests for detection of SARS-CoV-2 virus and/or diagnosis of COVID-19 infection under section 564(b)(1) of the Act, 21 U.S.C. 161WRU-0(A) (1), unless the authorization is terminated or revoked sooner. When diagnostic testing is negative, the possibility of a false negative result should be considered in the context of a patient's recent exposures and the presence of clinical signs and symptoms consistent with COVID-19. An individual without symptoms of COVID-19 and who is not shedding SARS-CoV-2 virus wo uld expect to have  a negative (not detected) result in this assay.   Respiratory (~20 pathogens) panel by PCR     Status: None   Collection  Time: 09/26/22  8:16 PM   Specimen: Nasopharyngeal Swab; Respiratory  Result Value Ref Range Status   Adenovirus NOT DETECTED NOT DETECTED Final   Coronavirus 229E NOT DETECTED NOT DETECTED Final    Comment: (NOTE) The Coronavirus on the Respiratory Panel, DOES NOT test for the novel  Coronavirus (2019 nCoV)    Coronavirus HKU1 NOT DETECTED NOT DETECTED Final   Coronavirus NL63 NOT DETECTED NOT DETECTED Final   Coronavirus OC43 NOT DETECTED NOT DETECTED Final   Metapneumovirus NOT DETECTED NOT DETECTED Final   Rhinovirus / Enterovirus NOT DETECTED NOT DETECTED Final   Influenza A NOT DETECTED NOT DETECTED Final   Influenza B NOT DETECTED NOT DETECTED Final   Parainfluenza Virus 1 NOT DETECTED NOT DETECTED Final   Parainfluenza Virus 2 NOT DETECTED NOT DETECTED Final   Parainfluenza Virus 3 NOT DETECTED NOT DETECTED Final   Parainfluenza Virus 4 NOT DETECTED NOT DETECTED Final   Respiratory Syncytial Virus NOT DETECTED NOT DETECTED Final   Bordetella pertussis NOT DETECTED NOT DETECTED Final   Bordetella Parapertussis NOT DETECTED NOT DETECTED Final   Chlamydophila pneumoniae NOT DETECTED NOT DETECTED Final   Mycoplasma pneumoniae NOT DETECTED NOT DETECTED Final    Comment: Performed at [facility], [address], [place], [place] [address]     Labs: BNP (last 3 results) Recent Labs    02/13/22 1527 05/28/22 1900 09/25/22 2024  BNP 166.7* 109.3* 46.1   Basic Metabolic Panel: Recent Labs  Lab 09/25/22 2024 09/27/22 0548  NA 137 138  K 3.6 4.3  CL 99 98  CO2 28 31  GLUCOSE 99 175*  BUN 23 22  CREATININE 0.85 0.73  CALCIUM 8.7* 9.1   Liver Function Tests: No results for input(s): "AST", "ALT", "ALKPHOS", "BILITOT", "PROT", "ALBUMIN" in the last 168 hours. No results for input(s): "LIPASE", "AMYLASE" in the last 168 hours. No results for input(s): "AMMONIA" in the last 168 hours. CBC: Recent Labs  Lab 09/25/22 2024 09/27/22 0548  WBC 8.0 10.5  NEUTROABS  6.8  --   HGB 13.6 13.7  HCT 45.0 44.8  MCV 88.9 88.7  PLT 420* 445*   Cardiac Enzymes: No results for input(s): "CKTOTAL", "CKMB", "CKMBINDEX", "TROPONINI" in the last 168 hours. BNP: Invalid input(s): "POCBNP" CBG: No results for input(s): "GLUCAP" in the last 168 hours. D-Dimer No results for input(s): "DDIMER" in the last 72 hours. Hgb A1c No results for input(s): "HGBA1C" in the last 72 hours. Lipid Profile No results for input(s): "CHOL", "HDL", "LDLCALC", "TRIG", "CHOLHDL", "LDLDIRECT" in the last 72 hours. Thyroid function studies No results for input(s): "TSH", "T4TOTAL", "T3FREE", "THYROIDAB" in the last 72 hours.  Invalid input(s): "FREET3" Anemia work up No results for input(s): "VITAMINB12", "FOLATE", "FERRITIN", "TIBC", "IRON", "RETICCTPCT" in the last 72 hours. Urinalysis    Component Value Date/Time   COLORURINE YELLOW 11/22/2019 0539   APPEARANCEUR CLOUDY (A) 11/22/2019 0539   LABSPEC >1.030 (H) 11/22/2019 0539   PHURINE 5.5 11/22/2019 0539   GLUCOSEU NEGATIVE 11/22/2019 0539   HGBUR TRACE (A) 11/22/2019 0539   BILIRUBINUR SMALL (A) 11/22/2019 0539   KETONESUR NEGATIVE 11/22/2019 0539   PROTEINUR 30 (A) 11/22/2019 0539   NITRITE NEGATIVE 11/22/2019 0539   LEUKOCYTESUR SMALL (A) 11/22/2019 0539   Sepsis Labs Recent Labs  Lab 09/25/22 2024 09/27/22 0548  WBC 8.0 10.5  Microbiology Recent Results (from the past 240 hour(s))  COVID-19, Flu A+B and RSV     Status: None   Collection Time: 09/20/22 10:34 AM   Specimen: Nasopharyngeal(NP) swabs in vial transport medium   Nasopharynge  Previous  Result Value Ref Range Status   SARS-CoV-2, NAA Not Detected Not Detected Final   Influenza A, NAA Not Detected Not Detected Final   Influenza B, NAA Not Detected Not Detected Final   RSV, NAA Not Detected Not Detected Final   Test Information: Comment  Final    Comment: This nucleic acid amplification test was developed and its performance characteristics  determined by [employer]. Nucleic acid amplification tests include RT-PCR and TMA. This test has not been FDA cleared or approved. This test has been authorized by FDA under an Emergency Use Authorization (EUA). This test is only authorized for the duration of time the declaration that circumstances exist justifying the authorization of the emergency use of in vitro diagnostic tests for detection of SARS-CoV-2 virus and/or diagnosis of COVID-19 infection under section 564(b)(1) of the Act, 21 U.S.C. 528UXL-2(G) (1), unless the authorization is terminated or revoked sooner. When diagnostic testing is negative, the possibility of a false negative result should be considered in the context of a patient's recent exposures and the presence of clinical signs and symptoms consistent with COVID-19. An individual without symptoms of COVID-19 and who is not shedding SARS-CoV-2 virus wo uld expect to have a negative (not detected) result in this assay.   Respiratory (~20 pathogens) panel by PCR     Status: None   Collection Time: 09/26/22  8:16 PM   Specimen: Nasopharyngeal Swab; Respiratory  Result Value Ref Range Status   Adenovirus NOT DETECTED NOT DETECTED Final   Coronavirus 229E NOT DETECTED NOT DETECTED Final    Comment: (NOTE) The Coronavirus on the Respiratory Panel, DOES NOT test for the novel  Coronavirus (2019 nCoV)    Coronavirus HKU1 NOT DETECTED NOT DETECTED Final   Coronavirus NL63 NOT DETECTED NOT DETECTED Final   Coronavirus OC43 NOT DETECTED NOT DETECTED Final   Metapneumovirus NOT DETECTED NOT DETECTED Final   Rhinovirus / Enterovirus NOT DETECTED NOT DETECTED Final   Influenza A NOT DETECTED NOT DETECTED Final   Influenza B NOT DETECTED NOT DETECTED Final   Parainfluenza Virus 1 NOT DETECTED NOT DETECTED Final   Parainfluenza Virus 2 NOT DETECTED NOT DETECTED Final   Parainfluenza Virus 3 NOT DETECTED NOT DETECTED Final   Parainfluenza Virus 4 NOT DETECTED NOT  DETECTED Final   Respiratory Syncytial Virus NOT DETECTED NOT DETECTED Final   Bordetella pertussis NOT DETECTED NOT DETECTED Final   Bordetella Parapertussis NOT DETECTED NOT DETECTED Final   Chlamydophila pneumoniae NOT DETECTED NOT DETECTED Final   Mycoplasma pneumoniae NOT DETECTED NOT DETECTED Final    Comment: Performed at [facility], [address], [place], [place] [address]     Time coordinating discharge: 35 minutes  SIGNED:   Glade Lloyd, MD  [facility] 09/29/2022, 10:18 AM

## 2022-10-05 DIAGNOSIS — J441 Chronic obstructive pulmonary disease with (acute) exacerbation: Secondary | ICD-10-CM | POA: Diagnosis not present

## 2022-10-05 DIAGNOSIS — Z09 Encounter for follow-up examination after completed treatment for conditions other than malignant neoplasm: Secondary | ICD-10-CM | POA: Diagnosis not present

## 2022-10-05 DIAGNOSIS — R232 Flushing: Secondary | ICD-10-CM | POA: Diagnosis not present

## 2022-10-21 DIAGNOSIS — J449 Chronic obstructive pulmonary disease, unspecified: Secondary | ICD-10-CM | POA: Diagnosis not present

## 2022-10-24 ENCOUNTER — Encounter: Payer: Self-pay | Admitting: Adult Health

## 2022-10-24 ENCOUNTER — Ambulatory Visit (INDEPENDENT_AMBULATORY_CARE_PROVIDER_SITE_OTHER): Payer: PPO | Admitting: Adult Health

## 2022-10-24 ENCOUNTER — Ambulatory Visit: Payer: PPO

## 2022-10-24 ENCOUNTER — Encounter (HOSPITAL_BASED_OUTPATIENT_CLINIC_OR_DEPARTMENT_OTHER): Payer: PPO

## 2022-10-24 ENCOUNTER — Telehealth: Payer: Self-pay | Admitting: *Deleted

## 2022-10-24 VITALS — BP 120/60 | HR 100 | Temp 98.1°F | Wt 165.0 lb

## 2022-10-24 DIAGNOSIS — J984 Other disorders of lung: Secondary | ICD-10-CM | POA: Diagnosis not present

## 2022-10-24 DIAGNOSIS — R911 Solitary pulmonary nodule: Secondary | ICD-10-CM

## 2022-10-24 DIAGNOSIS — J441 Chronic obstructive pulmonary disease with (acute) exacerbation: Secondary | ICD-10-CM

## 2022-10-24 DIAGNOSIS — J9611 Chronic respiratory failure with hypoxia: Secondary | ICD-10-CM | POA: Diagnosis not present

## 2022-10-24 DIAGNOSIS — I2699 Other pulmonary embolism without acute cor pulmonale: Secondary | ICD-10-CM

## 2022-10-24 DIAGNOSIS — R0609 Other forms of dyspnea: Secondary | ICD-10-CM

## 2022-10-24 DIAGNOSIS — J189 Pneumonia, unspecified organism: Secondary | ICD-10-CM | POA: Diagnosis not present

## 2022-10-24 MED ORDER — PREDNISONE 20 MG PO TABS
20.0000 mg | ORAL_TABLET | Freq: Every day | ORAL | 0 refills | Status: DC
Start: 2022-10-24 — End: 2022-11-10

## 2022-10-24 MED ORDER — BREZTRI AEROSPHERE 160-9-4.8 MCG/ACT IN AERO: 2.0000 | INHALATION_SPRAY | Freq: Two times a day (BID) | RESPIRATORY_TRACT | 0 refills | Status: DC

## 2022-10-24 NOTE — Patient Instructions (Addendum)
Prednisone 20mg  daily for 5 days.  Continue on Breztri 2 puffs Twice daily  , rinse after use  Albuterol inhaler or neb As needed  Continue on Zyrtec 10mg  daily  Continue on Omeprazole daily  Add Pepcid 20mg  At bedtime   Continue on Eliquis.  Continue on Oxygen 2l/m, increase 3l/m with activity.  Activity as tolerated.  Set up for VQ scan (rule of chronic PE)  Set up for 2 D Echo.  CT chest in 2 weeks.  Follow up with Dr. Tonia Brooms in 2 weeks with PFT and As needed   Please contact office for sooner follow up if symptoms do not improve or worsen or seek emergency care

## 2022-10-24 NOTE — Progress Notes (Unsigned)
@Patient  ID: [name], female    DOB: 1940/11/02, 82 y.o.   MRN: [id number]  Chief Complaint  Patient presents with   Follow-up    Referring provider: Fatima Sanger, [name]  HPI: 82 year old female former smoker followed for severe COPD and chronic respiratory failure on oxygen, right lung abscess, Recurrent PE and DVT, lung nodule   VTE History  Initial Left DVT 12/2018 (Unprovoked)   Complicated by retroperitoneal bleed (AC stopped and IVC filter placed August 2020 and removed in October 2020.  Acute PE with acute cor pulmonale 01/2019 . Started on Heparin with transition to Eliquis. Post thrombotic syndrome of the left lower extremity December 2020 and underwent mechanical thrombectomy of the left common iliac, left external iliac, left common femoral vein and left femoral vein, balloon angioplasty the left femoral vein and stent of the left common iliac, left external iliac and left common femoral vein. Hx of GI Bleed 08/2019 , Eliquis held briefly and then resumed. Patient stopped Eliquis on her own sometime in late 2021. ~11/2021 restarted Eliquis for UE DVT . Admitted 12/2021 with Lung abscess and recurrent large volume hemoptysis. Eliquis held . Admitted with submassive PE 02/14/23. Eliquis restarted. Admitted 09/26/22 with small small subsegmental emboli in the right lower lobe and right middle lobe ? chronic.. (On Eliquis)   TEST/EVENTS :  CT chest and November 17, 2021 showed emphysema no suspicious pulmonary nodules or masses. No consolidation.   02/13/22>> CT angio chest: Submassive PE, near complete resolution of RLL PNA. 02/14/22>> Echo: EF 70-75%, RV systolic function is severely reduced. 02/14/22>> bilateral lower extremity Doppler: DVT right femoral/popliteal/tibial/peroneal, left femoral vein  01/23/22>> CTA chest: New large alveolar infiltrate in the right lower lobe with cavitary lesion.  No PE. 01/25/22 >> barium esophagogram: No aspiration observed-no  strictures/masses. 01/24/22 >> BAL cytology: No malignant cells.   CT chest September 26, 2022 showed resolved cavitary lesion in the right lower lobe.  Enlarged right lower lobe nodule measuring 1.3 cm previously 1.  0 cm.  Bilateral breast implants with intracapsular rupture, emphysema, small subsegmental emboli in the right lower lobe and right middle lobe.  ? chronic.  Venous Doppler 09/26/22 negative DVT right lower extremity, DVT left popliteal vein age-indeterminate, chronic deep venous thrombosis involving the left femoral vein  Venous Doppler February 14, 2022 acute DVT right femoral vein, right popliteal vein, right popliteal tibial veins and right peroneal veins, acute DVT involving the left femoral vein  2D echo February 14, 2022 EF 70 to 75%, grade 1 diastolic dysfunction, right ventricular systolic function severely reduced  PFT (ordered 08/2022)  pending  Echo(ordered 10/24/22)  pending     10/24/2022 Follow up : COPD , O2 RF , Recurrent PE/DVT,  Lung Abscess, Post Hospital follow up  Patient returns for a posthospital follow-up.  Patient was readmitted earlier this month for acute COPD exacerbation and acute on chronic respiratory failure.  Patient was having ongoing cough wheezing and progressive shortness of breath despite outpatient treatment with antibiotics and steroids.  Patient was admitted treated with IV antibiotics and steroids and transition to a steroid taper and Levaquin at discharge.  Patient had previously been treated in August 2023 for cavitary right lower lobe lesion with extended antibiotics.  CT chest during recent admission on January 26, 2023 showed resolved cavitary lesion.  There was an enlarging right lower lobe nodule that went from 1.0 cm to 1.3 cm.  This will need ongoing serial imaging follow-up.  As above patient  has a history of recurrent DVT and PE, and endorces compliance on Eliquis.  CT chest did show small subsegmental emboli in the right lower lobe and  right middle lobe.  Questionable chronic.  Venous Doppler showed DVT on the left questionable chronic. She has had no hemoptysis, chest pain, edema, abdominal pain or nausea vomiting.  Patient says since discharge she continues to be very short of breath with minimal activity.  Her activity tolerance has decreased dramatically over the last several months.  Currently on oxygen 2 L at rest.  She does require increased oxygen on POC device at 3 L to maintain O2 saturations greater than 88 to 90%.  Patient says she lives alone.  Continues to drive.  Is fully independent. She remains on Breztri twice daily.  Has intermittent cough.  No wheezing.  Cough is worse at nighttime.  PFTs are pending.    Allergies  Allergen Reactions   Pneumococcal Vaccines Shortness Of Breath and Other (See Comments)    Couldn't breathe    Clarithromycin Swelling   Estradiol Rash    Reaction to cream   Sulfa Antibiotics Nausea And Vomiting and Swelling   Doxycycline Hyclate Other (See Comments)    stomach distress   Pneumococcal Polysaccharide Vaccine Other (See Comments)    Unknown   Sulfacetamide Sodium-Sulfur Other (See Comments)   Sulfonamide Derivatives Swelling    Immunization History  Administered Date(s) Administered   Fluad Quad(high Dose 65+) 01/10/2021   Influenza Split 02/27/2011, 02/27/2012, 02/26/2013, 02/26/2014, 02/27/2016, 03/25/2017   Influenza Whole 02/07/2009   Influenza, High Dose Seasonal PF 03/17/2014, 02/27/2017, 03/27/2018   Influenza, Quadrivalent, Recombinant, Inj, Pf 02/25/2018, 04/08/2019, 03/05/2020, 02/11/2021   Influenza,inj,Quad PF,6+ Mos 03/29/2022   Influenza-Unspecified 01/27/2013, 03/05/2015, 03/05/2020   PFIZER(Purple Top)SARS-COV-2 Vaccination 07/12/2019, 10/06/2019   Tdap 10/07/2013, 04/23/2022    Past Medical History:  Diagnosis Date   Allergic rhinitis    Anemia due to GI blood loss    Anxiety    Asthma    Chronic airway obstruction, not elsewhere classified     Depressive disorder, not elsewhere classified    DVT (deep venous thrombosis) (HCC)    LLE DVT 01/13/19   Dyspnea    Esophageal reflux    Essential hypertension 08/27/2019   PE (pulmonary thromboembolism) (HCC) 01/29/2019   Pneumonia    Wears partial dentures     Tobacco History: Social History   Tobacco Use  Smoking Status Former   Packs/day: 0.25   Years: 40.00   Additional pack years: 0.00   Total pack years: 10.00   Types: Cigarettes   Quit date: 2000   Years since quitting: 24.4  Smokeless Tobacco Never   Counseling given: Not Answered   Outpatient Medications Prior to Visit  Medication Sig Dispense Refill   acetaminophen (TYLENOL) 500 MG tablet Take 500 mg by mouth every 6 (six) hours as needed for headache (pain).     albuterol (PROVENTIL) (2.5 MG/3ML) 0.083% nebulizer solution Take 3 mLs (2.5 mg total) by nebulization every 6 (six) hours as needed for wheezing or shortness of breath. 75 mL 12   albuterol (VENTOLIN HFA) 108 (90 Base) MCG/ACT inhaler Inhale 1-2 puffs into the lungs every 6 (six) hours as needed for wheezing or shortness of breath.     amoxicillin (AMOXIL) 500 MG capsule Take 500 mg by mouth 3 (three) times daily.     apixaban (ELIQUIS) 5 MG TABS tablet Take 5 mg by mouth 2 (two) times daily.     Budeson-Glycopyrrol-Formoterol (  BREZTRI AEROSPHERE) 160-9-4.8 MCG/ACT AERO Inhale 2 puffs into the lungs in the morning and at bedtime. 10.7 g 2   cetirizine (ZYRTEC) 10 MG tablet Take 10 mg by mouth daily.     escitalopram (LEXAPRO) 20 MG tablet Take 20 mg by mouth daily.     fluticasone (FLONASE) 50 MCG/ACT nasal spray Place 2 sprays into both nostrils daily. 9.9 mL 0   furosemide (LASIX) 20 MG tablet Take 20 mg by mouth daily as needed for fluid or edema. Takes when having swelling.     gabapentin (NEURONTIN) 100 MG capsule Take 100 mg by mouth at bedtime.     LORazepam (ATIVAN) 1 MG tablet Take 1 mg by mouth at bedtime.     omeprazole (PRILOSEC) 20 MG  capsule Take 20 mg by mouth 2 (two) times daily before a meal.     traZODone (DESYREL) 100 MG tablet Take 100 mg by mouth at bedtime.     triamterene-hydrochlorothiazide (MAXZIDE-25) 37.5-25 MG tablet Take 0.5 tablets by mouth daily.     guaiFENesin-dextromethorphan (ROBITUSSIN DM) 100-10 MG/5ML syrup Take 10 mLs by mouth every 4 (four) hours as needed for cough. (Patient not taking: Reported on 10/24/2022) 118 mL 1   loratadine (CLARITIN) 10 MG tablet Take 1 tablet (10 mg total) by mouth daily. (Patient not taking: Reported on 10/24/2022) 30 tablet 0   pantoprazole (PROTONIX) 40 MG tablet Take 1 tablet (40 mg total) by mouth daily. (Patient not taking: Reported on 10/24/2022) 30 tablet 0   predniSONE (DELTASONE) 10 MG tablet Take 4 tabs by mouth for 5 days, then 3 for 5 days, 2 for 5 days, 1 for 5 days and stop (Patient not taking: Reported on 10/24/2022) 50 tablet 0   No facility-administered medications prior to visit.     Review of Systems:   Constitutional:   No  weight loss, night sweats,  Fevers, chills,  +fatigue, or  lassitude.  HEENT:   No headaches,  Difficulty swallowing,  Tooth/dental problems, or  Sore throat,                No sneezing, itching, ear ache, nasal congestion, post nasal drip,   CV:  No chest pain,  Orthopnea, PND, swelling in lower extremities, anasarca, dizziness, palpitations, syncope.   GI  No heartburn, indigestion, abdominal pain, nausea, vomiting, diarrhea, change in bowel habits, loss of appetite, bloody stools.   Resp: Skin: no rash or lesions.  GU: no dysuria, change in color of urine, no urgency or frequency.  No flank pain, no hematuria   MS:  No joint pain or swelling.  No decreased range of motion.  No back pain.    Physical Exam  BP 120/60 (BP Location: Left Arm, Patient Position: Sitting, Cuff Size: Large)   Pulse 100   Temp 98.1 F (36.7 C) (Oral)   Wt 165 lb (74.8 kg)   SpO2 91%   BMI 26.63 kg/m   GEN: A/Ox3; pleasant , NAD,  chronically ill-appearing, on oxygen   HEENT:  [place]/AT,  NOSE-clear, THROAT-clear, no lesions, no postnasal drip or exudate noted.   NECK:  Supple w/ fair ROM; no JVD; normal carotid impulses w/o bruits; no thyromegaly or nodules palpated; no lymphadenopathy.    RESP  Clear  P & A; w/o, wheezes/ rales/ or rhonchi. no accessory muscle use, no dullness to percussion  CARD:  RRR, no m/r/g, no peripheral edema, pulses intact, no cyanosis or clubbing.  GI:   Soft & nt; nml bowel  sounds; no organomegaly or masses detected.   Musco: Warm bil, no deformities or joint swelling noted.   Neuro: alert, no focal deficits noted.    Skin: Warm, no lesions or rashes    Lab Results:  CBC    Component Value Date/Time   WBC 10.5 09/27/2022 0548   RBC 5.05 09/27/2022 0548   HGB 13.7 09/27/2022 0548   HCT 44.8 09/27/2022 0548   PLT 445 (H) 09/27/2022 0548   MCV 88.7 09/27/2022 0548   MCH 27.1 09/27/2022 0548   MCHC 30.6 09/27/2022 0548   RDW 14.7 09/27/2022 0548   LYMPHSABS 0.8 09/25/2022 2024   MONOABS 0.4 09/25/2022 2024   EOSABS 0.0 09/25/2022 2024   BASOSABS 0.0 09/25/2022 2024    BMET    Component Value Date/Time   NA 138 09/27/2022 0548   K 4.3 09/27/2022 0548   CL 98 09/27/2022 0548   CO2 31 09/27/2022 0548   GLUCOSE 175 (H) 09/27/2022 0548   BUN 22 09/27/2022 0548   CREATININE 0.73 09/27/2022 0548   CREATININE 0.80 07/28/2019 1014   CALCIUM 9.1 09/27/2022 0548   GFRNONAA >60 09/27/2022 0548   GFRNONAA >60 07/28/2019 1014   GFRAA >60 11/22/2019 0603   GFRAA >60 07/28/2019 1014    BNP    Component Value Date/Time   BNP 46.1 09/25/2022 2024    ProBNP No results found for: "PROBNP"  Imaging: VAS [place] LOWER EXTREMITY VENOUS (DVT)  Result Date: 09/26/2022  Lower Venous DVT Study Patient Name:  NELSA KUTZLER  Date of Exam:   09/26/2022 Medical Rec #: [id number]      Accession #:    [id number] Date of Birth: 18-Dec-1940     Patient Gender: F Patient Age:   58 years Exam  Location:  [facility] Procedure:      VAS [place] LOWER EXTREMITY VENOUS (DVT) Referring Phys: Lyda Perone --------------------------------------------------------------------------------  Indications: Pulmonary embolism.  Risk Factors: Hx of multiple PEs and DVTs. Hx May Thurner's post stent placement of left iliac vein Anticoagulation: On Eliquis prior to admission. Comparison Study: Previous exam on 02/14/2022 was positive for DVT (BLE) Performing Technologist: Ernestene Mention RVT, RDMS  Examination Guidelines: A complete evaluation includes B-mode imaging, spectral Doppler, color Doppler, and power Doppler as needed of all accessible portions of each vessel. Bilateral testing is considered an integral part of a complete examination. Limited examinations for reoccurring indications may be performed as noted. The reflux portion of the exam is performed with the patient in reverse Trendelenburg.  +---------+---------------+---------+-----------+----------+--------------+ RIGHT    CompressibilityPhasicitySpontaneityPropertiesThrombus Aging +---------+---------------+---------+-----------+----------+--------------+ CFV      Full           Yes      Yes                                 +---------+---------------+---------+-----------+----------+--------------+ SFJ      Full                                                        +---------+---------------+---------+-----------+----------+--------------+ FV Prox  Full           Yes      Yes                                 +---------+---------------+---------+-----------+----------+--------------+  FV Mid   Full           Yes      Yes                                 +---------+---------------+---------+-----------+----------+--------------+ FV DistalFull           Yes      Yes                                 +---------+---------------+---------+-----------+----------+--------------+ PFV      Full                                                         +---------+---------------+---------+-----------+----------+--------------+ POP      Full           Yes      Yes                                 +---------+---------------+---------+-----------+----------+--------------+ PTV      Full                                                        +---------+---------------+---------+-----------+----------+--------------+ PERO     Full                                                        +---------+---------------+---------+-----------+----------+--------------+   +---------+---------------+---------+-----------+----------+-----------------+ LEFT     CompressibilityPhasicitySpontaneityPropertiesThrombus Aging    +---------+---------------+---------+-----------+----------+-----------------+ CFV      Full           Yes      Yes                                    +---------+---------------+---------+-----------+----------+-----------------+ SFJ      Full                                                           +---------+---------------+---------+-----------+----------+-----------------+ FV Prox  Partial        No       Yes                  Chronic           +---------+---------------+---------+-----------+----------+-----------------+ FV Mid   Partial        No       Yes                  Chronic           +---------+---------------+---------+-----------+----------+-----------------+ FV DistalPartial        Yes  Yes                  Chronic           +---------+---------------+---------+-----------+----------+-----------------+ PFV      Full                                                           +---------+---------------+---------+-----------+----------+-----------------+ POP      Partial        Yes      Yes                  Age Indeterminate +---------+---------------+---------+-----------+----------+-----------------+ PTV      Full                                                            +---------+---------------+---------+-----------+----------+-----------------+ PERO     Full                                                           +---------+---------------+---------+-----------+----------+-----------------+     Summary: BILATERAL: -No evidence of popliteal cyst, bilaterally. RIGHT: - There is no evidence of deep vein thrombosis in the lower extremity.  LEFT: - Findings consistent with age indeterminate deep vein thrombosis involving the left popliteal vein. - Findings consistent with chronic deep vein thrombosis involving the left femoral vein.  *See table(s) above for measurements and observations. Electronically signed by Waverly Ferrari MD on 09/26/2022 at 1:03:47 PM.    Final    CT Angio Chest Pulmonary Embolism (PE) W or WO Contrast  Result Date: 09/26/2022 CLINICAL DATA:  Coughing, shortness of breath and wheezing. EXAM: CT ANGIOGRAPHY CHEST WITH CONTRAST TECHNIQUE: Multidetector CT imaging of the chest was performed using the standard protocol during bolus administration of intravenous contrast. Multiplanar CT image reconstructions and MIPs were obtained to evaluate the vascular anatomy. RADIATION DOSE REDUCTION: This exam was performed according to the departmental dose-optimization program which includes automated exposure control, adjustment of the mA and/or kV according to patient size and/or use of iterative reconstruction technique. CONTRAST:  75mL OMNIPAQUE IOHEXOL 350 MG/ML SOLN COMPARISON:  PA and lateral chest yesterday, PA and lateral chest 09/20/2022 and 08/22/2022, and CTA chest 02/13/2022 FINDINGS: Cardiovascular: The cardiac size is within normal limits. There is no pericardial effusion. There are scattered calcifications in the LAD and right coronary arteries. There is atherosclerosis in the aorta and great vessels without aneurysm, dissection or stenosis. The pulmonary trunk measures 3.2 cm indicating arterial hypertension.  Central arteries are slightly prominent with the RV/LV ratio elevated to 1.26, previously 1.69. The pulmonary veins are decompressed. There previously were multiple bilateral segmental and subsegmental arterial emboli with moderate overall clot burden. Today the subsegmental arterial bed is largely obscured owing to breathing motion. There are small subsegmental filling defects in the infrahilar right lower lobe vessels on 4: 61 and 62 and segmental arterial filling defects in the right middle lobe on 4: 47-48. No other definitive  embolic filling defects are seen and it is possible this could be chronic embolic disease as there was a greater amount of thrombus in these vessels previously. The visible clot burden is small but other subsegmental emboli could be missed given breathing motion. Mediastinum/Nodes: No enlarged mediastinal, hilar, or axillary lymph nodes. Thyroid gland, trachea, and esophagus demonstrate no significant findings. Lungs/Pleura: There are mild-to-moderate centrilobular emphysematous changes. Previously noted cavitary lesion in the superior segment of the right lower lobe has resolved. There previously was a 1 cm right lower lobe nodule just beneath this which has grown to 1.3 cm on 6:54 and should be further studied with PET-CT. There is stranding to the pleural surfaces which was seen previously. There are scattered linear scar-like opacities in both lung bases. Fissural atelectasis is again noted anteriorly in the right upper lobe. There is mild elevation right hemidiaphragm. No pleural effusion, thickening or pneumothorax. Upper Abdomen: Small hiatal hernia.  No acute findings. Musculoskeletal: Bilateral silicone breast implants with intracapsular rupture are again noted. No other focal abnormality is seen in the chest wall. There is osteopenia with thoracic spondylosis. No acute or significant osseous findings. Review of the MIP images confirms the above findings. IMPRESSION: 1. There are  small subsegmental emboli in the right lower lobe and segmental filling defects right middle lobe. No other definitive emboli are seen. This could be chronic as there was a greater amount of thrombus in these vessels on the prior study. The visible clot burden is small but other subsegmental emboli could be missed due to breathing motion. 2. Slightly prominent central pulmonary arteries with RV/LV ratio elevated to 1.26, previously 1.69. This could be due to chronic pulmonary arterial hypertension. There was previously IVC and hepatic vein contrast reflux, which was not seen today. 3. Emphysema. 4. Aortic and coronary artery atherosclerosis. 5. Previously noted cavitary lesion in the superior segment of the right lower lobe has resolved, but there is a 1.3 cm right lower lobe nodule just below where the cavitary lesion was previously seen, which has grown from 1.0 cm. PET-CT is recommended. 6. Small hiatal hernia. 7. Bilateral silicone breast implants with intracapsular rupture. 8. Osteopenia and degenerative change. Aortic Atherosclerosis (ICD10-I70.0) and Emphysema (ICD10-J43.9). Electronically Signed   By: Almira Bar M.D.   On: 09/26/2022 06:31   DG Chest 2 View  Result Date: 09/25/2022 CLINICAL DATA:  Shortness of breath EXAM: CHEST - 2 VIEW COMPARISON:  09/20/2022 FINDINGS: The heart size and mediastinal contours are within normal limits. Both lungs are clear. The visualized skeletal structures are unremarkable. Prominent central pulmonary vessels suggesting arterial hypertension. IMPRESSION: No active cardiopulmonary disease. Prominent central pulmonary vessels suggesting arterial hypertension. Electronically Signed   By: Jasmine Pang M.D.   On: 09/25/2022 20:08    methylPREDNISolone acetate (DEPO-MEDROL) injection 80 mg     Date Action Dose Route User   Discharged on 09/29/2022   Admitted on 09/25/2022   09/20/2022 1005 Given 80 mg Intramuscular (Left Upper Outer Quadrant) Christen Butter, CMA            No data to display          No results found for: "NITRICOXIDE"      Assessment & Plan:   No problem-specific Assessment & Plan notes found for this encounter.     Rubye Oaks, NP 10/24/2022

## 2022-10-24 NOTE — Telephone Encounter (Signed)
Patient is on Breztri and Eliquis, she is in the donut hole and cannot afford meds.  Meds are $2200/month and se does not meet criteria for financial assistance.  Please advise on other options.  Thank you.

## 2022-10-24 NOTE — Telephone Encounter (Signed)
Pt called in saying she missed a call from [place]

## 2022-10-25 ENCOUNTER — Telehealth: Payer: Self-pay | Admitting: Adult Health

## 2022-10-25 DIAGNOSIS — J441 Chronic obstructive pulmonary disease with (acute) exacerbation: Secondary | ICD-10-CM

## 2022-10-26 DIAGNOSIS — R911 Solitary pulmonary nodule: Secondary | ICD-10-CM | POA: Insufficient documentation

## 2022-10-26 DIAGNOSIS — I2699 Other pulmonary embolism without acute cor pulmonale: Secondary | ICD-10-CM | POA: Insufficient documentation

## 2022-10-26 NOTE — Telephone Encounter (Signed)
Patient would like a call back to speak with nurse about a sooner appt.  Please call patient with an update.  CB# [phone]

## 2022-10-26 NOTE — Assessment & Plan Note (Signed)
Unprovoked recurrent PE and DVT:  Initial Left DVT 12/2018 (Unprovoked)   Complicated by retroperitoneal bleed on anticoagulation (AC stopped and IVC filter placed August 2020 and removed in October 2020.  Acute PE with acute cor pulmonale 01/2019 . Started on Heparin with transition to Eliquis. Post thrombotic syndrome of the left lower extremity December 2020 and underwent mechanical thrombectomy of the left common iliac, left external iliac, left common femoral vein and left femoral vein, balloon angioplasty the left femoral vein and stent of the left common iliac, left external iliac and left common femoral vein. Rec Lifelong AC. Hx of GI Bleed 08/2019 , Eliquis held briefly and then resumed. Patient stopped Eliquis on her own sometime in late 2021. ~11/2021 restarted Eliquis for UE DVT . Admitted 12/2021 with Lung abscess and recurrent large volume hemoptysis. Eliquis held . Admitted with submassive PE 02/14/23. Eliquis restarted. Admitted 09/26/22 with small small subsegmental emboli in the right lower lobe and right middle lobe ? chronic.. (On Eliquis)   Clinically continues to have ongoing progressive shortness of breath and decreased activity tolerance despite Eliquis compliance.  Most recent CT chest shows emboli in the right lower lobe and right middle lobe questionable chronic.  Venous Doppler showed chronic left DVT femoral and popliteal . patient may have CTEPH.  Will check 2D echo.  If pulmonary hypertension is noted would recommend VQ scan.  Continue on oxygen to maintain O2 saturations greater than 88 to 9% continue on Eliquis for now.

## 2022-10-26 NOTE — Assessment & Plan Note (Signed)
Enlarging right lower lobe lung nodule-noted in the setting of acute illness as well is near site of previous cavitary lesion that resolved with prolonged antibiotics.  Will need to follow very closely.  Patient will be set up for a CT chest in 2 weeks which will make a 6-week interval to further evaluate.  If continues to increase will need to undergo tissue sampling.  And most likely PET scan.

## 2022-10-26 NOTE — Assessment & Plan Note (Signed)
Right lower lobe cavitary lesion improved with prolonged antibiotics.  Most recent CT chest September 26, 2022 showed resolution.

## 2022-10-26 NOTE — Assessment & Plan Note (Signed)
Continue on oxygen to maintain O2 saturations greater than 88 to 90%  Plan  Patient Instructions  Prednisone 20mg  daily for 5 days.  Continue on Breztri 2 puffs Twice daily  , rinse after use  Albuterol inhaler or neb As needed  Continue on Zyrtec 10mg  daily  Continue on Omeprazole daily  Add Pepcid 20mg  At bedtime   Continue on Eliquis.  Continue on Oxygen 2l/m, increase 3l/m with activity.  Activity as tolerated.  Set up for VQ scan (rule of chronic PE)  Set up for 2 D Echo.  CT chest in 2 weeks.  Follow up with Dr. Tonia Brooms in 2 weeks with PFT and As needed   Please contact office for sooner follow up if symptoms do not improve or worsen or seek emergency care

## 2022-10-26 NOTE — Assessment & Plan Note (Addendum)
Slow to resolve COPD sedation-will continue on maintenance regimen.  Check PFTs on return.  Will give a short course of steroids.  Control for triggers such as reflux.  Plan  Patient Instructions  Prednisone 20mg  daily for 5 days.  Continue on Breztri 2 puffs Twice daily  , rinse after use  Albuterol inhaler or neb As needed  Continue on Zyrtec 10mg  daily  Continue on Omeprazole daily  Add Pepcid 20mg  At bedtime   Continue on Eliquis.  Continue on Oxygen 2l/m, increase 3l/m with activity.  Activity as tolerated.   Set up for 2 D Echo.  CT chest in 2 weeks.  Follow up with Dr. Tonia Brooms in 2 weeks with PFT and As needed   Please contact office for sooner follow up if symptoms do not improve or worsen or seek emergency care

## 2022-10-27 NOTE — Telephone Encounter (Signed)
Can we send to our pharmacy team to see if they can find any affordable options for her

## 2022-10-27 NOTE — Telephone Encounter (Signed)
I spoke with the patient. She is going into the donut hole. The pharmacy told her she will have to pay 400 or 500 dollars for a 3 months supply of Eliquis. She can not afford that.  She has tried to get financial assistance for both medications but she does not qualify.  What can she do?

## 2022-10-27 NOTE — Telephone Encounter (Signed)
Per Tammy Parrett's last office note- Follow up with Dr. Tonia Brooms in 2 weeks with PFT   I spoke with the patient. She said the next PFT appt was not until July but Tammy wanted it to be done in 2 weeks. And she wanted her to see Dr. Tonia Brooms in 2 weeks as well.  I offered her a PFT appointment on 11/02/2022 at 1:00pm at [facility] (I have changed the order to [facility]). She will take that appt. She needs an appt with Dr. Tonia Brooms in 2 weeks.  Dr. Tonia Brooms, you do not have any appointments until July. Can she be put in one of your nodule slots on 11/14/2022?

## 2022-10-30 ENCOUNTER — Other Ambulatory Visit: Payer: Self-pay | Admitting: *Deleted

## 2022-10-30 DIAGNOSIS — Z86711 Personal history of pulmonary embolism: Secondary | ICD-10-CM

## 2022-10-30 NOTE — Telephone Encounter (Signed)
Please let her know that we do not have a lot of options we can talk about it when she come back into office to see what we can change

## 2022-10-30 NOTE — Telephone Encounter (Signed)
I spoke with the patient. She is already scheduled to see Dr. Tonia Brooms on 12/12/2022. She would like to keep that appt. She would like to cancel the PFT appt on 11/02/2022 at [facility] and keep the appt for the PFT on 12/02/2022.   Synetta Fail, please cancel the PFT on 11/02/2022. Thank you!  Nothing further needed.

## 2022-10-30 NOTE — Telephone Encounter (Signed)
Unfortunately there is not much that we can do, the patient will have to get through the donut hole in order for prices to go down. Alternatively she may want to inquire about alternatives such as Warfarin which is cheaper but requires frequent labs to check levels.

## 2022-11-01 NOTE — Telephone Encounter (Signed)
Left message for patient to call back  

## 2022-11-02 ENCOUNTER — Ambulatory Visit: Payer: PPO

## 2022-11-07 ENCOUNTER — Ambulatory Visit (HOSPITAL_COMMUNITY)
Admission: RE | Admit: 2022-11-07 | Discharge: 2022-11-07 | Disposition: A | Discharge status: Home / Self Care | Payer: PPO | Source: Ambulatory Visit | Attending: Adult Health | Admitting: Adult Health

## 2022-11-07 ENCOUNTER — Ambulatory Visit (HOSPITAL_COMMUNITY): Payer: PPO

## 2022-11-07 DIAGNOSIS — Z86711 Personal history of pulmonary embolism: Secondary | ICD-10-CM | POA: Diagnosis not present

## 2022-11-07 DIAGNOSIS — J439 Emphysema, unspecified: Secondary | ICD-10-CM | POA: Diagnosis not present

## 2022-11-07 DIAGNOSIS — R911 Solitary pulmonary nodule: Secondary | ICD-10-CM | POA: Diagnosis not present

## 2022-11-07 DIAGNOSIS — I700 Atherosclerosis of aorta: Secondary | ICD-10-CM | POA: Diagnosis not present

## 2022-11-07 MED ORDER — IOHEXOL 350 MG/ML SOLN
75.0000 mL | Freq: Once | INTRAVENOUS | Status: AC | PRN
  Administered 2022-11-07: 75 mL via INTRAVENOUS

## 2022-11-07 MED ORDER — SODIUM CHLORIDE (PF) 0.9 % IJ SOLN
INTRAMUSCULAR | Status: AC
  Filled 2022-11-07: qty 50

## 2022-11-08 DIAGNOSIS — M1612 Unilateral primary osteoarthritis, left hip: Secondary | ICD-10-CM | POA: Diagnosis not present

## 2022-11-08 DIAGNOSIS — I82409 Acute embolism and thrombosis of unspecified deep veins of unspecified lower extremity: Secondary | ICD-10-CM | POA: Diagnosis not present

## 2022-11-08 DIAGNOSIS — R1032 Left lower quadrant pain: Secondary | ICD-10-CM | POA: Diagnosis not present

## 2022-11-09 ENCOUNTER — Ambulatory Visit: Payer: PPO | Admitting: Adult Health

## 2022-11-09 ENCOUNTER — Other Ambulatory Visit: Payer: Self-pay | Admitting: Registered Nurse

## 2022-11-09 DIAGNOSIS — I7121 Aneurysm of the ascending aorta, without rupture: Secondary | ICD-10-CM

## 2022-11-10 ENCOUNTER — Other Ambulatory Visit: Payer: Self-pay | Admitting: Adult Health

## 2022-11-10 ENCOUNTER — Ambulatory Visit (HOSPITAL_COMMUNITY)
Admission: RE | Admit: 2022-11-10 | Discharge: 2022-11-10 | Disposition: A | Discharge status: Home / Self Care | Payer: PPO | Source: Ambulatory Visit | Attending: Adult Health | Admitting: Adult Health

## 2022-11-10 DIAGNOSIS — R911 Solitary pulmonary nodule: Secondary | ICD-10-CM

## 2022-11-10 DIAGNOSIS — J449 Chronic obstructive pulmonary disease, unspecified: Secondary | ICD-10-CM | POA: Diagnosis not present

## 2022-11-10 DIAGNOSIS — Z86718 Personal history of other venous thrombosis and embolism: Secondary | ICD-10-CM | POA: Insufficient documentation

## 2022-11-10 DIAGNOSIS — I10 Essential (primary) hypertension: Secondary | ICD-10-CM | POA: Insufficient documentation

## 2022-11-10 DIAGNOSIS — R0600 Dyspnea, unspecified: Secondary | ICD-10-CM | POA: Insufficient documentation

## 2022-11-10 DIAGNOSIS — R0609 Other forms of dyspnea: Secondary | ICD-10-CM | POA: Diagnosis not present

## 2022-11-10 DIAGNOSIS — Z86711 Personal history of pulmonary embolism: Secondary | ICD-10-CM | POA: Insufficient documentation

## 2022-11-10 MED ORDER — PREDNISONE 20 MG PO TABS: 20.0000 mg | ORAL_TABLET | Freq: Every day | ORAL | 0 refills | Status: DC

## 2022-11-10 MED ORDER — LEVOFLOXACIN 500 MG PO TABS: 500.0000 mg | ORAL_TABLET | Freq: Every day | ORAL | 0 refills | Status: AC

## 2022-11-10 NOTE — Progress Notes (Signed)
  Echocardiogram 2D Echocardiogram has been performed.  Milda Smart 11/10/2022, 11:17 AM

## 2022-11-12 LAB — ECHOCARDIOGRAM COMPLETE
Area-P 1/2: 3.02 cm2
Calc EF: 56.2 %
MV VTI: 3.36 cm2
S' Lateral: 2.8 cm
Single Plane A2C EF: 60.2 %
Single Plane A4C EF: 58.1 %

## 2022-11-14 ENCOUNTER — Telehealth: Payer: Self-pay | Admitting: Adult Health

## 2022-11-14 NOTE — Telephone Encounter (Signed)
Patient is returning phone call. Patient phone number is [phone].

## 2022-11-15 ENCOUNTER — Other Ambulatory Visit: Payer: Self-pay

## 2022-11-15 DIAGNOSIS — R0609 Other forms of dyspnea: Secondary | ICD-10-CM

## 2022-11-15 NOTE — Telephone Encounter (Signed)
If she has been on levaquin and prednisone with no improvement, would recommend she go to UC or ED to be evaluated in person. Thanks.

## 2022-11-15 NOTE — Progress Notes (Signed)
Pft

## 2022-11-15 NOTE — Telephone Encounter (Signed)
Spoke with patient. Wet over recommendations from [place]. She verbalized understanding. NFN

## 2022-11-15 NOTE — Telephone Encounter (Signed)
Spoke with patient. She complains on increased SOB, wheezing, Productive cough with yellow phlegm. Patient still has one days worth of Levaquin left and has finished prednisone. She has upcoming apt with Dr. Tonia Brooms on 12/12/22 She is wanting to try and be seen sooner since SOB has been getting worse. No sooner apts available   Pharmacy [facility] on [address]   Florentina Addison can you please advise since Tammy and Dr. Tonia Brooms are unavailable?

## 2022-11-16 ENCOUNTER — Ambulatory Visit (INDEPENDENT_AMBULATORY_CARE_PROVIDER_SITE_OTHER): Payer: PPO | Admitting: Pulmonary Disease

## 2022-11-16 DIAGNOSIS — R0609 Other forms of dyspnea: Secondary | ICD-10-CM

## 2022-11-16 DIAGNOSIS — J441 Chronic obstructive pulmonary disease with (acute) exacerbation: Secondary | ICD-10-CM | POA: Diagnosis not present

## 2022-11-16 LAB — PULMONARY FUNCTION TEST
DL/VA % pred: 49 %
DL/VA: 2 ml/min/mmHg/L
DLCO cor % pred: 36 %
DLCO cor: 7.31 ml/min/mmHg
DLCO unc % pred: 36 %
DLCO unc: 7.38 ml/min/mmHg
FEF 25-75 Post: 0.33 L/sec
FEF 25-75 Pre: 0.26 L/sec
FEF2575-%Change-Post: 25 %
FEF2575-%Pred-Post: 21 %
FEF2575-%Pred-Pre: 16 %
FEV1-%Change-Post: 11 %
FEV1-%Pred-Post: 33 %
FEV1-%Pred-Pre: 29 %
FEV1-Post: 0.72 L
FEV1-Pre: 0.65 L
FEV1FVC-%Change-Post: -5 %
FEV1FVC-%Pred-Pre: 50 %
FEV6-%Change-Post: 9 %
FEV6-%Pred-Post: 62 %
FEV6-%Pred-Pre: 57 %
FEV6-Post: 1.73 L
FEV6-Pre: 1.58 L
FEV6FVC-%Change-Post: -6 %
FEV6FVC-%Pred-Post: 90 %
FEV6FVC-%Pred-Pre: 97 %
FVC-%Change-Post: 17 %
FVC-%Pred-Post: 69 %
FVC-%Pred-Pre: 59 %
FVC-Post: 2.01 L
FVC-Pre: 1.72 L
Post FEV1/FVC ratio: 36 %
Post FEV6/FVC ratio: 86 %
Pre FEV1/FVC ratio: 38 %
Pre FEV6/FVC Ratio: 92 %
RV % pred: 171 %
RV: 4.24 L
TLC % pred: 115 %
TLC: 6.16 L

## 2022-11-16 NOTE — Patient Instructions (Signed)
Full PFT performed today. °

## 2022-11-16 NOTE — Progress Notes (Signed)
Full PFT performed today. °

## 2022-11-17 NOTE — Progress Notes (Signed)
ATC x1.  LVM. 

## 2022-11-20 NOTE — Progress Notes (Signed)
Patient returned call.  Provided results per Rubye Oaks NP.  She verbalized understanding.  Nothing further needed.

## 2022-11-21 DIAGNOSIS — J449 Chronic obstructive pulmonary disease, unspecified: Secondary | ICD-10-CM | POA: Diagnosis not present

## 2022-12-05 ENCOUNTER — Encounter (HOSPITAL_BASED_OUTPATIENT_CLINIC_OR_DEPARTMENT_OTHER): Payer: PPO

## 2022-12-12 ENCOUNTER — Encounter: Payer: Self-pay | Admitting: Pulmonary Disease

## 2022-12-12 ENCOUNTER — Ambulatory Visit: Payer: PPO | Admitting: Pulmonary Disease

## 2022-12-12 VITALS — BP 120/80 | HR 77 | Ht 66.0 in | Wt 168.4 lb

## 2022-12-12 DIAGNOSIS — Z86711 Personal history of pulmonary embolism: Secondary | ICD-10-CM

## 2022-12-12 DIAGNOSIS — J449 Chronic obstructive pulmonary disease, unspecified: Secondary | ICD-10-CM | POA: Diagnosis not present

## 2022-12-12 DIAGNOSIS — J9611 Chronic respiratory failure with hypoxia: Secondary | ICD-10-CM | POA: Diagnosis not present

## 2022-12-12 NOTE — Patient Instructions (Signed)
Thank you for visiting Dr. Tonia Brooms at [facility]. Today we recommend the following:  Orders Placed This Encounter  Procedures   AMB referral to pulmonary rehabilitation   Return in about 6 months (around 06/14/2023) for with APP.    Please do your part to reduce the spread of COVID-19.

## 2022-12-12 NOTE — Progress Notes (Signed)
Synopsis: Referred in April 2024 for shortness of breath by Fatima Sanger, [name]  Subjective:   PATIENT ID: [name] GENDER: female DOB: [birth year], MRN: [id number]  Chief Complaint  Patient presents with   Follow-up    F/up on CT scan, PET, and PFT    This is a 82 year old female, past medical history of DVT, COPD, lung abscess treated with antibiotics.  She had a hospitalization in which she was treated with antimicrobials.  She has had follow-up chest x-rays.  She is here today complaints of shortness of breath.  She still has dyspnea on exertion.  She quit smoking several years ago.  She is currently on triple therapy inhaler regimen.  She has not had follow-up CT imaging.  OV 12/12/2022: Patient here today for COPD follow-up.  She had pulmonary function test completed which showed severe stage III COPD with FEV1 of 33% predicted postbronchodilator response.  She had repeat follow-up CT imaging which demonstrates stability of her right lower lobe pulmonary nodule.  We reviewed her PFTs and CT imaging today in the office.  Echocardiogram shows grade 1 diastolic dysfunction with a normal left ventricular ejection fraction.  She is on current triple therapy inhaler regimen.  She has a history of pulmonary embolism.  She is currently on anticoagulation and using her inhaler daily.  She is concerned about cost related to both Eliquis and the [employer]. We talked about [employer]'s capping of inhaler cost $35.  Hopefully will be able to help her through this time with finances to help maintain on medications.  She recently had an exacerbation that required antibiotics steroids and a visit to urgent care.  Unfortunately she has had 2 exacerbations this year.  But she is recovered from them quickly unfortunately she still does have significant shortness of breath with minimal exertion.  She is concerned about this and has not completed pulmonary rehab.    Past Medical History:  Diagnosis  Date   Allergic rhinitis    Anemia due to GI blood loss    Anxiety    Asthma    Chronic airway obstruction, not elsewhere classified    Depressive disorder, not elsewhere classified    DVT (deep venous thrombosis) (HCC)    LLE DVT 01/13/19   Dyspnea    Esophageal reflux    Essential hypertension 08/27/2019   PE (pulmonary thromboembolism) (HCC) 01/29/2019   Pneumonia    Wears partial dentures      Family History  Problem Relation Age of Onset   Diabetes Mother    Coronary artery disease Mother    Emphysema Father    Coronary artery disease Brother      Past Surgical History:  Procedure Laterality Date   ABDOMINAL HYSTERECTOMY     ABDOMINAL HYSTERECTOMY     BRONCHIAL WASHINGS  01/24/2022   Procedure: BRONCHIAL WASHINGS;  Surgeon: Lupita Leash, MD;  Location: [facility] ENDOSCOPY;  Service: Cardiopulmonary;;   CATARACT EXTRACTION W/ INTRAOCULAR LENS  IMPLANT, BILATERAL     CHOLECYSTECTOMY N/A 08/12/2021   Procedure: LAPAROSCOPIC CHOLECYSTECTOMY;  Surgeon: Quentin Ore, MD;  Location: [facility] ORS;  Service: General;  Laterality: N/A;   COLONOSCOPY     COLONOSCOPY WITH PROPOFOL N/A 08/20/2019   Procedure: COLONOSCOPY WITH PROPOFOL;  Surgeon: Bernette Redbird, MD;  Location: [facility] ENDOSCOPY;  Service: Endoscopy;  Laterality: N/A;   CORONARY ULTRASOUND/IVUS Left 05/26/2019   Procedure: Intravascular Ultrasound/IVUS;  Surgeon: Maeola Harman, MD;  Location: [facility] INVASIVE CV LAB;  Service: Cardiovascular;  Laterality: Left;   CYSTOSCOPY     DILATION AND CURETTAGE OF UTERUS     ECTROPION REPAIR Bilateral 05/12/2021   Procedure: REPAIR OF ECTROPION BY LATERAL TARSAL STRIP OF BILATERAL LOWER EYE LIDS;  Surgeon: Dairl Ponder, MD;  Location: [facility] OR;  Service: Ophthalmology;  Laterality: Bilateral;   ESOPHAGOGASTRODUODENOSCOPY (EGD) WITH PROPOFOL N/A 08/20/2019   Procedure: ESOPHAGOGASTRODUODENOSCOPY (EGD) WITH PROPOFOL;  Surgeon: Bernette Redbird, MD;  Location: [facility] ENDOSCOPY;   Service: Endoscopy;  Laterality: N/A;   HEMOSTASIS CONTROL  08/20/2019   Procedure: HEMOSTASIS CONTROL;  Surgeon: Bernette Redbird, MD;  Location: [facility] ENDOSCOPY;  Service: Endoscopy;;   IVC FILTER REMOVAL N/A 03/24/2019   Procedure: IVC FILTER REMOVAL;  Surgeon: Maeola Harman, MD;  Location: [facility] INVASIVE CV LAB;  Service: Cardiovascular;  Laterality: N/A;   LACRIMAL TUBE INSERTION Right 05/12/2021   Procedure: RIGHT NASOLACRIMAL DUCT PROBE WITH MINI MONOKA STENT PLACEMENT;  Surgeon: Dairl Ponder, MD;  Location: [facility] OR;  Service: Ophthalmology;  Laterality: Right;   LESION EXCISION WITH COMPLEX REPAIR N/A 05/12/2021   Procedure: NASAL LESION EXCISION;  Surgeon: Dairl Ponder, MD;  Location: [facility] OR;  Service: Ophthalmology;  Laterality: N/A;   LOWER EXTREMITY VENOGRAPHY Left 05/26/2019   Procedure: LOWER EXTREMITY VENOGRAPHY;  Surgeon: Maeola Harman, MD;  Location: [facility] INVASIVE CV LAB;  Service: Cardiovascular;  Laterality: Left;   MULTIPLE TOOTH EXTRACTIONS     NASAL SINUS SURGERY     PERIPHERAL VASCULAR INTERVENTION Left 05/26/2019   Procedure: PERIPHERAL VASCULAR INTERVENTION;  Surgeon: Maeola Harman, MD;  Location: [facility] INVASIVE CV LAB;  Service: Cardiovascular;  Laterality: Left;  lower extremity veins   PERIPHERAL VASCULAR THROMBECTOMY  05/26/2019   Procedure: PERIPHERAL VASCULAR THROMBECTOMY;  Surgeon: Maeola Harman, MD;  Location: [facility] INVASIVE CV LAB;  Service: Cardiovascular;;   PTOSIS REPAIR Bilateral 05/12/2021   Procedure: EXTERNAL PTOSIS REPAIR LEVATOR ADVANCEDMENT/RESECTION OF BILATERAL UPPER EYE LIDS;  Surgeon: Dairl Ponder, MD;  Location: [facility] OR;  Service: Ophthalmology;  Laterality: Bilateral;   TUBAL LIGATION     VENA CAVA FILTER PLACEMENT N/A 01/13/2019   Procedure: INSERTION VENA-CAVA FILTER;  Surgeon: Maeola Harman, MD;  Location: [facility] OR;  Service: Vascular;  Laterality: N/A;   VIDEO BRONCHOSCOPY N/A 01/24/2022    Procedure: VIDEO BRONCHOSCOPY WITHOUT FLUORO;  Surgeon: Lupita Leash, MD;  Location: [facility] ENDOSCOPY;  Service: Cardiopulmonary;  Laterality: N/A;    Social History   Socioeconomic History   Marital status: Single    Spouse name: Not on file   Number of children: Not on file   Years of education: Not on file   Highest education level: Not on file  Occupational History   Occupation: Disabled  Tobacco Use   Smoking status: Former    Current packs/day: 0.00    Average packs/day: 0.3 packs/day for 40.0 years (10.0 ttl pk-yrs)    Types: Cigarettes    Start date: 37    Quit date: 2000    Years since quitting: 24.5   Smokeless tobacco: Never  Vaping Use   Vaping status: Never Used  Substance and Sexual Activity   Alcohol use: No   Drug use: No   Sexual activity: Not on file  Other Topics Concern   Not on file  Social History Narrative   Not on file   Social Determinants of Health   Financial Resource Strain: Not on file  Food Insecurity: No Food Insecurity (09/26/2022)   Hunger Vital Sign    Worried About Running Out  of Food in the Last Year: Never true    Ran Out of Food in the Last Year: Never true  Transportation Needs: No Transportation Needs (09/26/2022)   PRAPARE - Administrator, Civil Service (Medical): No    Lack of Transportation (Non-Medical): No  Physical Activity: Not on file  Stress: Not on file  Social Connections: Unknown (10/11/2021)   Received from [facility]   Social Network    Social Network: Not on file  Intimate Partner Violence: Not At Risk (09/26/2022)   Humiliation, Afraid, Rape, and Kick questionnaire    Fear of Current or Ex-Partner: No    Emotionally Abused: No    Physically Abused: No    Sexually Abused: No     Allergies  Allergen Reactions   Pneumococcal Vaccines Shortness Of Breath and Other (See Comments)    Couldn't breathe    Clarithromycin Swelling   Estradiol Rash    Reaction to cream   Sulfa Antibiotics  Nausea And Vomiting and Swelling   Doxycycline Hyclate Other (See Comments)    stomach distress   Pneumococcal Polysaccharide Vaccine Other (See Comments)    Unknown   Sulfacetamide Sodium-Sulfur Other (See Comments)   Sulfonamide Derivatives Swelling     Outpatient Medications Prior to Visit  Medication Sig Dispense Refill   acetaminophen (TYLENOL) 500 MG tablet Take 500 mg by mouth every 6 (six) hours as needed for headache (pain).     albuterol (PROVENTIL) (2.5 MG/3ML) 0.083% nebulizer solution Take 3 mLs (2.5 mg total) by nebulization every 6 (six) hours as needed for wheezing or shortness of breath. 75 mL 12   albuterol (VENTOLIN HFA) 108 (90 Base) MCG/ACT inhaler Inhale 1-2 puffs into the lungs every 6 (six) hours as needed for wheezing or shortness of breath.     amoxicillin (AMOXIL) 500 MG capsule Take 500 mg by mouth 3 (three) times daily.     apixaban (ELIQUIS) 5 MG TABS tablet Take 5 mg by mouth 2 (two) times daily.     Budeson-Glycopyrrol-Formoterol (BREZTRI AEROSPHERE) 160-9-4.8 MCG/ACT AERO Inhale 2 puffs into the lungs in the morning and at bedtime. 10.7 g 2   Budeson-Glycopyrrol-Formoterol (BREZTRI AEROSPHERE) 160-9-4.8 MCG/ACT AERO Inhale 2 puffs into the lungs in the morning and at bedtime. 5.9 g 0   cetirizine (ZYRTEC) 10 MG tablet Take 10 mg by mouth daily.     escitalopram (LEXAPRO) 20 MG tablet Take 20 mg by mouth daily.     fluticasone (FLONASE) 50 MCG/ACT nasal spray Place 2 sprays into both nostrils daily. 9.9 mL 0   furosemide (LASIX) 20 MG tablet Take 20 mg by mouth daily as needed for fluid or edema. Takes when having swelling.     gabapentin (NEURONTIN) 100 MG capsule Take 100 mg by mouth at bedtime.     LORazepam (ATIVAN) 1 MG tablet Take 1 mg by mouth at bedtime.     omeprazole (PRILOSEC) 20 MG capsule Take 20 mg by mouth 2 (two) times daily before a meal.     predniSONE (DELTASONE) 20 MG tablet Take 1 tablet (20 mg total) by mouth daily with breakfast. 5  tablet 0   traZODone (DESYREL) 100 MG tablet Take 100 mg by mouth at bedtime.     triamterene-hydrochlorothiazide (MAXZIDE-25) 37.5-25 MG tablet Take 0.5 tablets by mouth daily.     No facility-administered medications prior to visit.    Review of Systems  Constitutional:  Negative for chills, fever, malaise/fatigue and weight loss.  HENT:  Negative for hearing loss, sore throat and tinnitus.   Eyes:  Negative for blurred vision and double vision.  Respiratory:  Positive for cough and shortness of breath. Negative for hemoptysis, sputum production, wheezing and stridor.   Cardiovascular:  Negative for chest pain, palpitations, orthopnea, leg swelling and PND.  Gastrointestinal:  Negative for abdominal pain, constipation, diarrhea, heartburn, nausea and vomiting.  Genitourinary:  Negative for dysuria, hematuria and urgency.  Musculoskeletal:  Negative for joint pain and myalgias.  Skin:  Negative for itching and rash.  Neurological:  Negative for dizziness, tingling, weakness and headaches.  Endo/Heme/Allergies:  Negative for environmental allergies. Does not bruise/bleed easily.  Psychiatric/Behavioral:  Negative for depression. The patient is not nervous/anxious and does not have insomnia.   All other systems reviewed and are negative.    Objective:  Physical Exam Vitals reviewed.  Constitutional:      General: She is not in acute distress.    Appearance: She is well-developed.  HENT:     Head: Normocephalic and atraumatic.  Eyes:     General: No scleral icterus.    Conjunctiva/sclera: Conjunctivae normal.     Pupils: Pupils are equal, round, and reactive to light.  Neck:     Vascular: No JVD.     Trachea: No tracheal deviation.  Cardiovascular:     Rate and Rhythm: Normal rate and regular rhythm.     Heart sounds: Normal heart sounds. No murmur heard. Pulmonary:     Effort: Pulmonary effort is normal. No tachypnea, accessory muscle usage or respiratory distress.      Breath sounds: No stridor. No wheezing, rhonchi or rales.     Comments: Severely diminished breath sounds bilaterally Abdominal:     General: Bowel sounds are normal. There is no distension.     Palpations: Abdomen is soft.     Tenderness: There is no abdominal tenderness.  Musculoskeletal:        General: No tenderness.     Cervical back: Neck supple.  Lymphadenopathy:     Cervical: No cervical adenopathy.  Skin:    General: Skin is warm and dry.     Capillary Refill: Capillary refill takes less than 2 seconds.     Findings: No rash.  Neurological:     Mental Status: She is alert and oriented to person, place, and time.  Psychiatric:        Behavior: Behavior normal.      Vitals:   12/12/22 1440  BP: 120/80  Pulse: 77  SpO2: 90%  Weight: 168 lb 6.4 oz (76.4 kg)  Height: 5\' 6"  (1.676 m)   90% on RA BMI Readings from Last 3 Encounters:  12/12/22 27.18 kg/m  10/24/22 26.63 kg/m  09/25/22 26.31 kg/m   Wt Readings from Last 3 Encounters:  12/12/22 168 lb 6.4 oz (76.4 kg)  10/24/22 165 lb (74.8 kg)  09/25/22 163 lb (73.9 kg)     CBC    Component Value Date/Time   WBC 10.5 09/27/2022 0548   RBC 5.05 09/27/2022 0548   HGB 13.7 09/27/2022 0548   HCT 44.8 09/27/2022 0548   PLT 445 (H) 09/27/2022 0548   MCV 88.7 09/27/2022 0548   MCH 27.1 09/27/2022 0548   MCHC 30.6 09/27/2022 0548   RDW 14.7 09/27/2022 0548   LYMPHSABS 0.8 09/25/2022 2024   MONOABS 0.4 09/25/2022 2024   EOSABS 0.0 09/25/2022 2024   BASOSABS 0.0 09/25/2022 2024    Chest Imaging:  Chest x-ray 08/22/2022: No active pulmonary disease  no infiltrate. The patient's images have been independently reviewed by me.    Pulmonary Functions Testing Results:    Latest Ref Rng & Units 11/15/2022    8:23 AM  PFT Results  FVC-Pre L 1.72  P  FVC-Predicted Pre % 59  P  FVC-Post L 2.01  P  FVC-Predicted Post % 69  P  Pre FEV1/FVC % % 38  P  Post FEV1/FCV % % 36  P  FEV1-Pre L 0.65  P  FEV1-Predicted  Pre % 29  P  FEV1-Post L 0.72  P  DLCO uncorrected ml/min/mmHg 7.38  P  DLCO UNC% % 36  P  DLCO corrected ml/min/mmHg 7.31  P  DLCO COR %Predicted % 36  P  DLVA Predicted % 49  P  TLC L 6.16  P  TLC % Predicted % 115  P  RV % Predicted % 171  P    P Preliminary result    FeNO:   Pathology:   Echocardiogram:   Heart Catheterization:     Assessment & Plan:     ICD-10-CM   1. COPD, severe (HCC)  J44.9 AMB referral to pulmonary rehabilitation    2. Chronic hypoxemic respiratory failure (HCC)  J96.11     3. History of pulmonary embolism  Z86.711        Discussion:  This is a 82 year old female with a history of a cavitary pneumonia and what appears to have fluid within the fissure line on previous CT imaging nodular area that has been followed up that shows stability.  She is significantly short of breath and she feels like this has been progressively worse since her hospitalization.  PFTs are confirmatory for severe COPD with an FEV1 of 0.75 L, 33% predicted is also a history of pulmonary embolism on anticoagulation.  Plan: I think she has multifactorial etiology for shortness of breath. I do not think that chronic prednisone is appropriate for her at this time we talked about this.  She had several questions regarding that. Referral placed to pulmonary rehab. Continue Breztri If she has another exacerbation I would have low threshold for starting azithromycin Monday Wednesday Friday 250 mg. Would need a EKG prior to that. She needs to continue anticoagulation because she has had 2 clots in the past. Return to clinic in approximately 3 months.  Or if symptoms worsen.    Current Outpatient Medications:    acetaminophen (TYLENOL) 500 MG tablet, Take 500 mg by mouth every 6 (six) hours as needed for headache (pain)., Disp: , Rfl:    albuterol (PROVENTIL) (2.5 MG/3ML) 0.083% nebulizer solution, Take 3 mLs (2.5 mg total) by nebulization every 6 (six) hours as needed for  wheezing or shortness of breath., Disp: 75 mL, Rfl: 12   albuterol (VENTOLIN HFA) 108 (90 Base) MCG/ACT inhaler, Inhale 1-2 puffs into the lungs every 6 (six) hours as needed for wheezing or shortness of breath., Disp: , Rfl:    amoxicillin (AMOXIL) 500 MG capsule, Take 500 mg by mouth 3 (three) times daily., Disp: , Rfl:    apixaban (ELIQUIS) 5 MG TABS tablet, Take 5 mg by mouth 2 (two) times daily., Disp: , Rfl:    Budeson-Glycopyrrol-Formoterol (BREZTRI AEROSPHERE) 160-9-4.8 MCG/ACT AERO, Inhale 2 puffs into the lungs in the morning and at bedtime., Disp: 10.7 g, Rfl: 2   Budeson-Glycopyrrol-Formoterol (BREZTRI AEROSPHERE) 160-9-4.8 MCG/ACT AERO, Inhale 2 puffs into the lungs in the morning and at bedtime., Disp: 5.9 g, Rfl: 0   cetirizine (ZYRTEC) 10  MG tablet, Take 10 mg by mouth daily., Disp: , Rfl:    escitalopram (LEXAPRO) 20 MG tablet, Take 20 mg by mouth daily., Disp: , Rfl:    fluticasone (FLONASE) 50 MCG/ACT nasal spray, Place 2 sprays into both nostrils daily., Disp: 9.9 mL, Rfl: 0   furosemide (LASIX) 20 MG tablet, Take 20 mg by mouth daily as needed for fluid or edema. Takes when having swelling., Disp: , Rfl:    gabapentin (NEURONTIN) 100 MG capsule, Take 100 mg by mouth at bedtime., Disp: , Rfl:    LORazepam (ATIVAN) 1 MG tablet, Take 1 mg by mouth at bedtime., Disp: , Rfl:    omeprazole (PRILOSEC) 20 MG capsule, Take 20 mg by mouth 2 (two) times daily before a meal., Disp: , Rfl:    predniSONE (DELTASONE) 20 MG tablet, Take 1 tablet (20 mg total) by mouth daily with breakfast., Disp: 5 tablet, Rfl: 0   traZODone (DESYREL) 100 MG tablet, Take 100 mg by mouth at bedtime., Disp: , Rfl:    triamterene-hydrochlorothiazide (MAXZIDE-25) 37.5-25 MG tablet, Take 0.5 tablets by mouth daily., Disp: , Rfl:    Josephine Igo, DO [facility] 12/12/2022 3:28 PM

## 2022-12-20 DIAGNOSIS — M25561 Pain in right knee: Secondary | ICD-10-CM | POA: Diagnosis not present

## 2022-12-20 DIAGNOSIS — M25552 Pain in left hip: Secondary | ICD-10-CM | POA: Diagnosis not present

## 2022-12-20 DIAGNOSIS — M545 Low back pain, unspecified: Secondary | ICD-10-CM | POA: Diagnosis not present

## 2022-12-21 DIAGNOSIS — J449 Chronic obstructive pulmonary disease, unspecified: Secondary | ICD-10-CM | POA: Diagnosis not present

## 2022-12-26 DIAGNOSIS — J449 Chronic obstructive pulmonary disease, unspecified: Secondary | ICD-10-CM | POA: Diagnosis not present

## 2022-12-28 DIAGNOSIS — M6281 Muscle weakness (generalized): Secondary | ICD-10-CM | POA: Diagnosis not present

## 2022-12-28 DIAGNOSIS — S76012D Strain of muscle, fascia and tendon of left hip, subsequent encounter: Secondary | ICD-10-CM | POA: Diagnosis not present

## 2023-01-03 DIAGNOSIS — S76012D Strain of muscle, fascia and tendon of left hip, subsequent encounter: Secondary | ICD-10-CM | POA: Diagnosis not present

## 2023-01-03 DIAGNOSIS — M6281 Muscle weakness (generalized): Secondary | ICD-10-CM | POA: Diagnosis not present

## 2023-01-18 ENCOUNTER — Telehealth (HOSPITAL_COMMUNITY): Payer: Self-pay

## 2023-01-18 DIAGNOSIS — R0602 Shortness of breath: Secondary | ICD-10-CM | POA: Diagnosis not present

## 2023-01-18 DIAGNOSIS — J441 Chronic obstructive pulmonary disease with (acute) exacerbation: Secondary | ICD-10-CM | POA: Diagnosis not present

## 2023-01-18 NOTE — Telephone Encounter (Signed)
Pt unable to do pulmonary rehab right now. I advised pt to call back when she is able. Closed referral.

## 2023-01-21 DIAGNOSIS — J449 Chronic obstructive pulmonary disease, unspecified: Secondary | ICD-10-CM | POA: Diagnosis not present

## 2023-01-26 DIAGNOSIS — J449 Chronic obstructive pulmonary disease, unspecified: Secondary | ICD-10-CM | POA: Diagnosis not present

## 2023-01-31 DIAGNOSIS — M25552 Pain in left hip: Secondary | ICD-10-CM | POA: Diagnosis not present

## 2023-02-06 DIAGNOSIS — Z20822 Contact with and (suspected) exposure to covid-19: Secondary | ICD-10-CM | POA: Diagnosis not present

## 2023-02-06 DIAGNOSIS — R0602 Shortness of breath: Secondary | ICD-10-CM | POA: Diagnosis not present

## 2023-02-06 DIAGNOSIS — J441 Chronic obstructive pulmonary disease with (acute) exacerbation: Secondary | ICD-10-CM | POA: Diagnosis not present

## 2023-02-09 ENCOUNTER — Ambulatory Visit
Admission: RE | Admit: 2023-02-09 | Discharge: 2023-02-09 | Disposition: A | Discharge status: Home / Self Care | Payer: PPO | Source: Ambulatory Visit | Attending: Adult Health | Admitting: Adult Health

## 2023-02-09 DIAGNOSIS — I700 Atherosclerosis of aorta: Secondary | ICD-10-CM | POA: Diagnosis not present

## 2023-02-09 DIAGNOSIS — R911 Solitary pulmonary nodule: Secondary | ICD-10-CM

## 2023-02-09 DIAGNOSIS — J439 Emphysema, unspecified: Secondary | ICD-10-CM | POA: Diagnosis not present

## 2023-02-19 ENCOUNTER — Other Ambulatory Visit: Payer: Self-pay

## 2023-02-19 DIAGNOSIS — R911 Solitary pulmonary nodule: Secondary | ICD-10-CM

## 2023-02-21 DIAGNOSIS — J449 Chronic obstructive pulmonary disease, unspecified: Secondary | ICD-10-CM | POA: Diagnosis not present

## 2023-02-27 DIAGNOSIS — Z23 Encounter for immunization: Secondary | ICD-10-CM | POA: Diagnosis not present

## 2023-02-27 DIAGNOSIS — E7800 Pure hypercholesterolemia, unspecified: Secondary | ICD-10-CM | POA: Diagnosis not present

## 2023-02-27 DIAGNOSIS — N390 Urinary tract infection, site not specified: Secondary | ICD-10-CM | POA: Diagnosis not present

## 2023-02-27 DIAGNOSIS — D649 Anemia, unspecified: Secondary | ICD-10-CM | POA: Diagnosis not present

## 2023-02-28 ENCOUNTER — Ambulatory Visit (HOSPITAL_COMMUNITY)
Admission: RE | Admit: 2023-02-28 | Discharge: 2023-02-28 | Disposition: A | Discharge status: Home / Self Care | Payer: PPO | Source: Ambulatory Visit | Attending: Adult Health | Admitting: Adult Health

## 2023-02-28 DIAGNOSIS — R911 Solitary pulmonary nodule: Secondary | ICD-10-CM | POA: Insufficient documentation

## 2023-02-28 LAB — GLUCOSE, CAPILLARY: Glucose-Capillary: 119 mg/dL — ABNORMAL HIGH (ref 70–99)

## 2023-02-28 MED ORDER — FLUDEOXYGLUCOSE F - 18 (FDG) INJECTION
8.4000 | Freq: Once | INTRAVENOUS | Status: AC
  Administered 2023-02-28: 8.4 via INTRAVENOUS

## 2023-03-06 DIAGNOSIS — Z7901 Long term (current) use of anticoagulants: Secondary | ICD-10-CM | POA: Diagnosis not present

## 2023-03-06 DIAGNOSIS — F5101 Primary insomnia: Secondary | ICD-10-CM | POA: Diagnosis not present

## 2023-03-06 DIAGNOSIS — J449 Chronic obstructive pulmonary disease, unspecified: Secondary | ICD-10-CM | POA: Diagnosis not present

## 2023-03-06 DIAGNOSIS — E559 Vitamin D deficiency, unspecified: Secondary | ICD-10-CM | POA: Diagnosis not present

## 2023-03-06 DIAGNOSIS — J441 Chronic obstructive pulmonary disease with (acute) exacerbation: Secondary | ICD-10-CM | POA: Diagnosis not present

## 2023-03-06 DIAGNOSIS — Z86711 Personal history of pulmonary embolism: Secondary | ICD-10-CM | POA: Diagnosis not present

## 2023-03-12 ENCOUNTER — Ambulatory Visit: Payer: PPO | Admitting: Pulmonary Disease

## 2023-03-12 ENCOUNTER — Ambulatory Visit: Payer: PPO | Admitting: Adult Health

## 2023-03-12 ENCOUNTER — Encounter: Payer: Self-pay | Admitting: Adult Health

## 2023-03-12 VITALS — BP 160/88 | HR 81 | Temp 97.8°F | Ht 66.0 in | Wt 170.0 lb

## 2023-03-12 DIAGNOSIS — J441 Chronic obstructive pulmonary disease with (acute) exacerbation: Secondary | ICD-10-CM

## 2023-03-12 DIAGNOSIS — R911 Solitary pulmonary nodule: Secondary | ICD-10-CM

## 2023-03-12 DIAGNOSIS — I2699 Other pulmonary embolism without acute cor pulmonale: Secondary | ICD-10-CM

## 2023-03-12 DIAGNOSIS — J9611 Chronic respiratory failure with hypoxia: Secondary | ICD-10-CM | POA: Diagnosis not present

## 2023-03-12 MED ORDER — PREDNISONE 10 MG PO TABS: ORAL_TABLET | ORAL | 0 refills | Status: DC

## 2023-03-12 MED ORDER — BREZTRI AEROSPHERE 160-9-4.8 MCG/ACT IN AERO: 2.0000 | INHALATION_SPRAY | Freq: Two times a day (BID) | RESPIRATORY_TRACT | Status: DC

## 2023-03-12 MED ORDER — CEFDINIR 300 MG PO CAPS: 300.0000 mg | ORAL_CAPSULE | Freq: Two times a day (BID) | ORAL | 0 refills | Status: DC

## 2023-03-12 NOTE — Assessment & Plan Note (Signed)
Recurrent COPD exacerbation.  Will treat with empiric antibiotics and steroids.  Long discussion with patient regarding frequent steroid use and potential complications.  Restart triple therapy with Breztri.  Patient was given 2 samples.  She says that she will go ahead and fill her prescription as she should be able to afford it over the next 2 months.  Plan   Patient Instructions  Omnicef 300mg  Twice daily  for 7 days  Prednisone taper over next week.  Restart on Breztri 2 puffs Twice daily  , rinse after use  Albuterol inhaler or neb As needed  Continue on Zyrtec 10mg  daily  Continue on Omeprazole daily  Continue on Eliquis.  Continue on Oxygen 2l/m, increase 3l/m with activity.  Activity as tolerated.  Follow up with Dr. Tonia Brooms in 3 months and As needed   Please contact office for sooner follow up if symptoms do not improve or worsen or seek emergency care

## 2023-03-12 NOTE — H&P (View-Only) (Signed)
@Patient  ID: [name], female    DOB: [birth year], 82 y.o.   MRN: [id number]  Chief Complaint  Patient presents with   Follow-up    Referring provider: Fatima Sanger, [name]  HPI: 82 year old female former smoker followed for severe COPD and chronic respiratory failure on oxygen, right lung abscess, recurrent PE and DVT, lung nodule  VTE History  Initial Left DVT 12/2018 (Unprovoked)   Complicated by retroperitoneal bleed (AC stopped and IVC filter placed August 2020 and removed in October 2020.  Acute PE with acute cor pulmonale 01/2019 . Started on Heparin with transition to Eliquis. Post thrombotic syndrome of the left lower extremity December 2020 and underwent mechanical thrombectomy of the left common iliac, left external iliac, left common femoral vein and left femoral vein, balloon angioplasty the left femoral vein and stent of the left common iliac, left external iliac and left common femoral vein. Hx of GI Bleed 08/2019 , Eliquis held briefly and then resumed. Patient stopped Eliquis on her own sometime in late 2021. ~11/2021 restarted Eliquis for UE DVT . Admitted 12/2021 with Lung abscess and recurrent large volume hemoptysis. Eliquis held . Admitted with submassive PE 02/14/23. Eliquis restarted. Admitted 09/26/22 with small small subsegmental emboli in the right lower lobe and right middle lobe ? chronic.. (On Eliquis)   TEST/EVENTS :  CT chest and November 17, 2021 showed emphysema no suspicious pulmonary nodules or masses. No consolidation.    02/13/22>> CT angio chest: Submassive PE, near complete resolution of RLL PNA. 02/14/22>> Echo: EF 70-75%, RV systolic function is severely reduced. 02/14/22>> bilateral lower extremity Doppler: DVT right femoral/popliteal/tibial/peroneal, left femoral vein   01/23/22>> CTA chest: New large alveolar infiltrate in the right lower lobe with cavitary lesion.  No PE. 01/25/22 >> barium esophagogram: No aspiration observed-no  strictures/masses. 01/24/22 >> BAL cytology: No malignant cells.    CT chest September 26, 2022 showed resolved cavitary lesion in the right lower lobe.  Enlarged right lower lobe nodule measuring 1.3 cm previously 1.  0 cm.  Bilateral breast implants with intracapsular rupture, emphysema, small subsegmental emboli in the right lower lobe and right middle lobe.  ? chronic.  CT chest February 09, 2023 spiculated right lower lobe nodule slight growth from April 2024 worrisome for bronchogenic carcinoma.  Venous Doppler February 14, 2022 acute DVT right femoral vein, right popliteal vein, right popliteal tibial veins and right peroneal veins, acute DVT involving the left femoral vein   Venous Doppler 09/26/22 negative DVT right lower extremity, DVT left popliteal vein age-indeterminate, chronic deep venous thrombosis involving the left femoral vein    2D echo February 14, 2022 EF 70 to 75%, grade 1 diastolic dysfunction, right ventricular systolic function severely reduced   Echo November 10, 2022 EF 60 to 65%, grade 1 diastolic dysfunction, RV SF normal,, RV size normal, normal pulmonary artery systolic pressure.  PFT November 16, 2022 showed  severe COPD with FEV1 at 29%, ratio 38, FVC 59%, positive bronchodilator response with FEV1 postbronchodilator at 33%, ratio 36, FVC 69%, DLCO 36%  03/12/2023 Follow up : COPD, O2 RF , PE, Lung nodule  Patient presents for an acute office visit.  Patient complains over the last 2 to 3 weeks that she has had increased cough, congestion, wheezing.  She was seen by her primary care provider last week and given a prednisone injection.  She is prone to frequent exacerbations.  Previous PFTs in June showed very severe airflow obstruction with an FEV1 at 29%  and a ratio at 38.  She is oxygen dependent with oxygen 2 L at rest and 3 L with activity. Patient is leaving on vacation later this week to go to [facility] for her 82th birthday.  She denies any hemoptysis, chest pain,  orthopnea, edema.  She does admit that she is run out of her breztri ,  as she is in the medication gap and cannot afford the inhaler currently.  She has taken some leftover Advair that she had previously. She has a history of recurrent PE.  She is on lifelong anticoagulation therapy with Eliquis. CT chest February 09, 2023 for serial follow-up of lung nodule.  Showed increased growth in right lower lobe lung nodule measuring 1.3 x 1.6 cm previously measured 1.0 x 1.2 cm.  Patient was set up for a PET scan that was completed March 08, 2023.  Results are still pending.    Allergies  Allergen Reactions   Pneumococcal Vaccines Shortness Of Breath and Other (See Comments)    Couldn't breathe    Clarithromycin Swelling   Estradiol Rash    Reaction to cream   Sulfa Antibiotics Nausea And Vomiting and Swelling   Doxycycline Hyclate Other (See Comments)    stomach distress   Pneumococcal Polysaccharide Vaccine Other (See Comments)    Unknown   Sulfacetamide Sodium-Sulfur Other (See Comments)   Sulfonamide Derivatives Swelling    Immunization History  Administered Date(s) Administered   Fluad Quad(high Dose 65+) 01/10/2021   Influenza Split 02/27/2011, 02/27/2012, 02/26/2013, 02/26/2014, 02/27/2016, 03/25/2017   Influenza Whole 02/07/2009   Influenza, High Dose Seasonal PF 03/17/2014, 02/27/2017, 03/27/2018   Influenza, Quadrivalent, Recombinant, Inj, Pf 02/25/2018, 04/08/2019, 03/05/2020, 02/11/2021   Influenza,inj,Quad PF,6+ Mos 03/29/2022   Influenza-Unspecified 01/27/2013, 03/05/2015, 03/05/2020   PFIZER(Purple Top)SARS-COV-2 Vaccination 07/12/2019, 08/06/2019, 10/06/2019   Tdap 10/07/2013, 04/23/2022    Past Medical History:  Diagnosis Date   Allergic rhinitis    Anemia due to GI blood loss    Anxiety    Asthma    Chronic airway obstruction, not elsewhere classified    Depressive disorder, not elsewhere classified    DVT (deep venous thrombosis) (HCC)    LLE DVT 01/13/19    Dyspnea    Esophageal reflux    Essential hypertension 08/27/2019   PE (pulmonary thromboembolism) (HCC) 01/29/2019   Pneumonia    Wears partial dentures     Tobacco History: Social History   Tobacco Use  Smoking Status Former   Current packs/day: 0.00   Average packs/day: 0.3 packs/day for 40.0 years (10.0 ttl pk-yrs)   Types: Cigarettes   Start date: 69   Quit date: 2000   Years since quitting: 24.8  Smokeless Tobacco Never   Counseling given: Not Answered   Outpatient Medications Prior to Visit  Medication Sig Dispense Refill   acetaminophen (TYLENOL) 500 MG tablet Take 500 mg by mouth every 6 (six) hours as needed for headache (pain).     albuterol (PROVENTIL) (2.5 MG/3ML) 0.083% nebulizer solution Take 3 mLs (2.5 mg total) by nebulization every 6 (six) hours as needed for wheezing or shortness of breath. 75 mL 12   albuterol (VENTOLIN HFA) 108 (90 Base) MCG/ACT inhaler Inhale 1-2 puffs into the lungs every 6 (six) hours as needed for wheezing or shortness of breath.     amoxicillin (AMOXIL) 500 MG capsule Take 500 mg by mouth 3 (three) times daily.     apixaban (ELIQUIS) 5 MG TABS tablet Take 5 mg by mouth 2 (two) times  daily.     Budeson-Glycopyrrol-Formoterol (BREZTRI AEROSPHERE) 160-9-4.8 MCG/ACT AERO Inhale 2 puffs into the lungs in the morning and at bedtime. 10.7 g 2   Budeson-Glycopyrrol-Formoterol (BREZTRI AEROSPHERE) 160-9-4.8 MCG/ACT AERO Inhale 2 puffs into the lungs in the morning and at bedtime. 5.9 g 0   cetirizine (ZYRTEC) 10 MG tablet Take 10 mg by mouth daily.     escitalopram (LEXAPRO) 20 MG tablet Take 20 mg by mouth daily.     fluticasone (FLONASE) 50 MCG/ACT nasal spray Place 2 sprays into both nostrils daily. 9.9 mL 0   furosemide (LASIX) 20 MG tablet Take 20 mg by mouth daily as needed for fluid or edema. Takes when having swelling.     gabapentin (NEURONTIN) 100 MG capsule Take 100 mg by mouth at bedtime.     LORazepam (ATIVAN) 1 MG tablet Take 1  mg by mouth at bedtime.     omeprazole (PRILOSEC) 20 MG capsule Take 20 mg by mouth 2 (two) times daily before a meal.     predniSONE (DELTASONE) 20 MG tablet Take 1 tablet (20 mg total) by mouth daily with breakfast. 5 tablet 0   traZODone (DESYREL) 100 MG tablet Take 100 mg by mouth at bedtime.     triamterene-hydrochlorothiazide (MAXZIDE-25) 37.5-25 MG tablet Take 0.5 tablets by mouth daily.     No facility-administered medications prior to visit.     Review of Systems:   Constitutional:   No  weight loss, night sweats,  Fevers, chills,  +fatigue, or  lassitude.  HEENT:   No headaches,  Difficulty swallowing,  Tooth/dental problems, or  Sore throat,                No sneezing, itching, ear ache, nasal congestion, post nasal drip,   CV:  No chest pain,  Orthopnea, PND, swelling in lower extremities, anasarca, dizziness, palpitations, syncope.   GI  No heartburn, indigestion, abdominal pain, nausea, vomiting, diarrhea, change in bowel habits, loss of appetite, bloody stools.   Resp:   No chest wall deformity  Skin: no rash or lesions.  GU: no dysuria, change in color of urine, no urgency or frequency.  No flank pain, no hematuria   MS:  No joint pain or swelling.  No decreased range of motion.  No back pain.    Physical Exam    GEN: A/Ox3; pleasant , NAD, well nourished    HEENT:  [place]/AT,  EACs-clear, TMs-wnl, NOSE-clear, THROAT-clear, no lesions, no postnasal drip or exudate noted.   NECK:  Supple w/ fair ROM; no JVD; normal carotid impulses w/o bruits; no thyromegaly or nodules palpated; no lymphadenopathy.    RESP scattered rhonchi bilaterally  no accessory muscle use, no dullness to percussion  CARD:  RRR, no m/r/g, no peripheral edema, pulses intact, no cyanosis or clubbing.  GI:   Soft & nt; nml bowel sounds; no organomegaly or masses detected.   Musco: Warm bil, no deformities or joint swelling noted.   Neuro: alert, no focal deficits noted.    Skin: Warm,  no lesions or rashes    Lab Results:   BNP    Component Value Date/Time   BNP 46.1 09/25/2022 2024    ProBNP No results found for: "PROBNP"  Imaging: No results found.  Administration History     None          Latest Ref Rng & Units 11/15/2022    8:23 AM  PFT Results  FVC-Pre L 1.72   FVC-Predicted Pre %  59   FVC-Post L 2.01   FVC-Predicted Post % 69   Pre FEV1/FVC % % 38   Post FEV1/FCV % % 36   FEV1-Pre L 0.65   FEV1-Predicted Pre % 29   FEV1-Post L 0.72   DLCO uncorrected ml/min/mmHg 7.38   DLCO UNC% % 36   DLCO corrected ml/min/mmHg 7.31   DLCO COR %Predicted % 36   DLVA Predicted % 49   TLC L 6.16   TLC % Predicted % 115   RV % Predicted % 171     No results found for: "NITRICOXIDE"      Assessment & Plan:   No problem-specific Assessment & Plan notes found for this encounter.   I spent  41  minutes dedicated to the care of this patient on the date of this encounter to include pre-visit review of records, face-to-face time with the patient discussing conditions above, post visit ordering of testing, clinical documentation with the electronic health record, making appropriate referrals as documented, and communicating necessary findings to members of the patients care team.    Rubye Oaks, NP 03/12/2023

## 2023-03-12 NOTE — Patient Instructions (Addendum)
Omnicef 300mg  Twice daily  for 7 days  Prednisone taper over next week.  Restart on Breztri 2 puffs Twice daily  , rinse after use  Albuterol inhaler or neb As needed  Continue on Zyrtec 10mg  daily  Continue on Omeprazole daily  Continue on Eliquis.  Continue on Oxygen 2l/m, increase 3l/m with activity.  Activity as tolerated.  Follow up with Dr. Tonia Brooms in 3 months and As needed   Please contact office for sooner follow up if symptoms do not improve or worsen or seek emergency care

## 2023-03-12 NOTE — Progress Notes (Signed)
@Patient  ID: [name], female    DOB: [birth year], 82 y.o.   MRN: [id number]  Chief Complaint  Patient presents with   Follow-up    Referring provider: Fatima Sanger, [name]  HPI: 82 year old female former smoker followed for severe COPD and chronic respiratory failure on oxygen, right lung abscess, recurrent PE and DVT, lung nodule  VTE History  Initial Left DVT 12/2018 (Unprovoked)   Complicated by retroperitoneal bleed (AC stopped and IVC filter placed August 2020 and removed in October 2020.  Acute PE with acute cor pulmonale 01/2019 . Started on Heparin with transition to Eliquis. Post thrombotic syndrome of the left lower extremity December 2020 and underwent mechanical thrombectomy of the left common iliac, left external iliac, left common femoral vein and left femoral vein, balloon angioplasty the left femoral vein and stent of the left common iliac, left external iliac and left common femoral vein. Hx of GI Bleed 08/2019 , Eliquis held briefly and then resumed. Patient stopped Eliquis on her own sometime in late 2021. ~11/2021 restarted Eliquis for UE DVT . Admitted 12/2021 with Lung abscess and recurrent large volume hemoptysis. Eliquis held . Admitted with submassive PE 02/14/23. Eliquis restarted. Admitted 09/26/22 with small small subsegmental emboli in the right lower lobe and right middle lobe ? chronic.. (On Eliquis)   TEST/EVENTS :  CT chest and November 17, 2021 showed emphysema no suspicious pulmonary nodules or masses. No consolidation.    02/13/22>> CT angio chest: Submassive PE, near complete resolution of RLL PNA. 02/14/22>> Echo: EF 70-75%, RV systolic function is severely reduced. 02/14/22>> bilateral lower extremity Doppler: DVT right femoral/popliteal/tibial/peroneal, left femoral vein   01/23/22>> CTA chest: New large alveolar infiltrate in the right lower lobe with cavitary lesion.  No PE. 01/25/22 >> barium esophagogram: No aspiration observed-no  strictures/masses. 01/24/22 >> BAL cytology: No malignant cells.    CT chest September 26, 2022 showed resolved cavitary lesion in the right lower lobe.  Enlarged right lower lobe nodule measuring 1.3 cm previously 1.  0 cm.  Bilateral breast implants with intracapsular rupture, emphysema, small subsegmental emboli in the right lower lobe and right middle lobe.  ? chronic.  CT chest February 09, 2023 spiculated right lower lobe nodule slight growth from April 2024 worrisome for bronchogenic carcinoma.  Venous Doppler February 14, 2022 acute DVT right femoral vein, right popliteal vein, right popliteal tibial veins and right peroneal veins, acute DVT involving the left femoral vein   Venous Doppler 09/26/22 negative DVT right lower extremity, DVT left popliteal vein age-indeterminate, chronic deep venous thrombosis involving the left femoral vein    2D echo February 14, 2022 EF 70 to 75%, grade 1 diastolic dysfunction, right ventricular systolic function severely reduced   Echo November 10, 2022 EF 60 to 65%, grade 1 diastolic dysfunction, RV SF normal,, RV size normal, normal pulmonary artery systolic pressure.  PFT November 16, 2022 showed  severe COPD with FEV1 at 29%, ratio 38, FVC 59%, positive bronchodilator response with FEV1 postbronchodilator at 33%, ratio 36, FVC 69%, DLCO 36%  03/12/2023 Follow up : COPD, O2 RF , PE, Lung nodule  Patient presents for an acute office visit.  Patient complains over the last 2 to 3 weeks that she has had increased cough, congestion, wheezing.  She was seen by her primary care provider last week and given a prednisone injection.  She is prone to frequent exacerbations.  Previous PFTs in June showed very severe airflow obstruction with an FEV1 at 29%  and a ratio at 38.  She is oxygen dependent with oxygen 2 L at rest and 3 L with activity. Patient is leaving on vacation later this week to go to [facility] for her 82th birthday.  She denies any hemoptysis, chest pain,  orthopnea, edema.  She does admit that she is run out of her breztri ,  as she is in the medication gap and cannot afford the inhaler currently.  She has taken some leftover Advair that she had previously. She has a history of recurrent PE.  She is on lifelong anticoagulation therapy with Eliquis. CT chest February 09, 2023 for serial follow-up of lung nodule.  Showed increased growth in right lower lobe lung nodule measuring 1.3 x 1.6 cm previously measured 1.0 x 1.2 cm.  Patient was set up for a PET scan that was completed March 08, 2023.  Results are still pending.    Allergies  Allergen Reactions   Pneumococcal Vaccines Shortness Of Breath and Other (See Comments)    Couldn't breathe    Clarithromycin Swelling   Estradiol Rash    Reaction to cream   Sulfa Antibiotics Nausea And Vomiting and Swelling   Doxycycline Hyclate Other (See Comments)    stomach distress   Pneumococcal Polysaccharide Vaccine Other (See Comments)    Unknown   Sulfacetamide Sodium-Sulfur Other (See Comments)   Sulfonamide Derivatives Swelling    Immunization History  Administered Date(s) Administered   Fluad Quad(high Dose 65+) 01/10/2021   Influenza Split 02/27/2011, 02/27/2012, 02/26/2013, 02/26/2014, 02/27/2016, 03/25/2017   Influenza Whole 02/07/2009   Influenza, High Dose Seasonal PF 03/17/2014, 02/27/2017, 03/27/2018   Influenza, Quadrivalent, Recombinant, Inj, Pf 02/25/2018, 04/08/2019, 03/05/2020, 02/11/2021   Influenza,inj,Quad PF,6+ Mos 03/29/2022   Influenza-Unspecified 01/27/2013, 03/05/2015, 03/05/2020   PFIZER(Purple Top)SARS-COV-2 Vaccination 07/12/2019, 08/06/2019, 10/06/2019   Tdap 10/07/2013, 04/23/2022    Past Medical History:  Diagnosis Date   Allergic rhinitis    Anemia due to GI blood loss    Anxiety    Asthma    Chronic airway obstruction, not elsewhere classified    Depressive disorder, not elsewhere classified    DVT (deep venous thrombosis) (HCC)    LLE DVT 01/13/19    Dyspnea    Esophageal reflux    Essential hypertension 08/27/2019   PE (pulmonary thromboembolism) (HCC) 01/29/2019   Pneumonia    Wears partial dentures     Tobacco History: Social History   Tobacco Use  Smoking Status Former   Current packs/day: 0.00   Average packs/day: 0.3 packs/day for 40.0 years (10.0 ttl pk-yrs)   Types: Cigarettes   Start date: 69   Quit date: 2000   Years since quitting: 24.8  Smokeless Tobacco Never   Counseling given: Not Answered   Outpatient Medications Prior to Visit  Medication Sig Dispense Refill   acetaminophen (TYLENOL) 500 MG tablet Take 500 mg by mouth every 6 (six) hours as needed for headache (pain).     albuterol (PROVENTIL) (2.5 MG/3ML) 0.083% nebulizer solution Take 3 mLs (2.5 mg total) by nebulization every 6 (six) hours as needed for wheezing or shortness of breath. 75 mL 12   albuterol (VENTOLIN HFA) 108 (90 Base) MCG/ACT inhaler Inhale 1-2 puffs into the lungs every 6 (six) hours as needed for wheezing or shortness of breath.     amoxicillin (AMOXIL) 500 MG capsule Take 500 mg by mouth 3 (three) times daily.     apixaban (ELIQUIS) 5 MG TABS tablet Take 5 mg by mouth 2 (two) times  daily.     Budeson-Glycopyrrol-Formoterol (BREZTRI AEROSPHERE) 160-9-4.8 MCG/ACT AERO Inhale 2 puffs into the lungs in the morning and at bedtime. 10.7 g 2   Budeson-Glycopyrrol-Formoterol (BREZTRI AEROSPHERE) 160-9-4.8 MCG/ACT AERO Inhale 2 puffs into the lungs in the morning and at bedtime. 5.9 g 0   cetirizine (ZYRTEC) 10 MG tablet Take 10 mg by mouth daily.     escitalopram (LEXAPRO) 20 MG tablet Take 20 mg by mouth daily.     fluticasone (FLONASE) 50 MCG/ACT nasal spray Place 2 sprays into both nostrils daily. 9.9 mL 0   furosemide (LASIX) 20 MG tablet Take 20 mg by mouth daily as needed for fluid or edema. Takes when having swelling.     gabapentin (NEURONTIN) 100 MG capsule Take 100 mg by mouth at bedtime.     LORazepam (ATIVAN) 1 MG tablet Take 1  mg by mouth at bedtime.     omeprazole (PRILOSEC) 20 MG capsule Take 20 mg by mouth 2 (two) times daily before a meal.     predniSONE (DELTASONE) 20 MG tablet Take 1 tablet (20 mg total) by mouth daily with breakfast. 5 tablet 0   traZODone (DESYREL) 100 MG tablet Take 100 mg by mouth at bedtime.     triamterene-hydrochlorothiazide (MAXZIDE-25) 37.5-25 MG tablet Take 0.5 tablets by mouth daily.     No facility-administered medications prior to visit.     Review of Systems:   Constitutional:   No  weight loss, night sweats,  Fevers, chills,  +fatigue, or  lassitude.  HEENT:   No headaches,  Difficulty swallowing,  Tooth/dental problems, or  Sore throat,                No sneezing, itching, ear ache, nasal congestion, post nasal drip,   CV:  No chest pain,  Orthopnea, PND, swelling in lower extremities, anasarca, dizziness, palpitations, syncope.   GI  No heartburn, indigestion, abdominal pain, nausea, vomiting, diarrhea, change in bowel habits, loss of appetite, bloody stools.   Resp:   No chest wall deformity  Skin: no rash or lesions.  GU: no dysuria, change in color of urine, no urgency or frequency.  No flank pain, no hematuria   MS:  No joint pain or swelling.  No decreased range of motion.  No back pain.    Physical Exam    GEN: A/Ox3; pleasant , NAD, well nourished    HEENT:  [place]/AT,  EACs-clear, TMs-wnl, NOSE-clear, THROAT-clear, no lesions, no postnasal drip or exudate noted.   NECK:  Supple w/ fair ROM; no JVD; normal carotid impulses w/o bruits; no thyromegaly or nodules palpated; no lymphadenopathy.    RESP scattered rhonchi bilaterally  no accessory muscle use, no dullness to percussion  CARD:  RRR, no m/r/g, no peripheral edema, pulses intact, no cyanosis or clubbing.  GI:   Soft & nt; nml bowel sounds; no organomegaly or masses detected.   Musco: Warm bil, no deformities or joint swelling noted.   Neuro: alert, no focal deficits noted.    Skin: Warm,  no lesions or rashes    Lab Results:   BNP    Component Value Date/Time   BNP 46.1 09/25/2022 2024    ProBNP No results found for: "PROBNP"  Imaging: No results found.  Administration History     None          Latest Ref Rng & Units 11/15/2022    8:23 AM  PFT Results  FVC-Pre L 1.72   FVC-Predicted Pre %  59   FVC-Post L 2.01   FVC-Predicted Post % 69   Pre FEV1/FVC % % 38   Post FEV1/FCV % % 36   FEV1-Pre L 0.65   FEV1-Predicted Pre % 29   FEV1-Post L 0.72   DLCO uncorrected ml/min/mmHg 7.38   DLCO UNC% % 36   DLCO corrected ml/min/mmHg 7.31   DLCO COR %Predicted % 36   DLVA Predicted % 49   TLC L 6.16   TLC % Predicted % 115   RV % Predicted % 171     No results found for: "NITRICOXIDE"      Assessment & Plan:   No problem-specific Assessment & Plan notes found for this encounter.   I spent  41  minutes dedicated to the care of this patient on the date of this encounter to include pre-visit review of records, face-to-face time with the patient discussing conditions above, post visit ordering of testing, clinical documentation with the electronic health record, making appropriate referrals as documented, and communicating necessary findings to members of the patients care team.    Rubye Oaks, NP 03/12/2023

## 2023-03-12 NOTE — Assessment & Plan Note (Signed)
History of recurrent PE.  Continue on lifelong anticoagulation therapy with Eliquis.

## 2023-03-12 NOTE — Addendum Note (Signed)
Addended by: Delrae Rend on: 03/12/2023 05:27 PM   Modules accepted: Orders

## 2023-03-12 NOTE — Assessment & Plan Note (Addendum)
Continue on oxygen to maintain O2 saturations greater than 88 to 90%

## 2023-03-12 NOTE — Assessment & Plan Note (Signed)
Enlarging right lower lobe lung nodule-PET scan results are pending.  If area is PET positive without metastatic disease may need to consider referral to radiation oncology-as her very severe COPD and oxygen dependent would exclude her from surgical candidate.

## 2023-03-22 ENCOUNTER — Telehealth: Payer: Self-pay | Admitting: Adult Health

## 2023-03-22 DIAGNOSIS — R911 Solitary pulmonary nodule: Secondary | ICD-10-CM

## 2023-03-22 NOTE — Telephone Encounter (Signed)
Hey can we get this put on the template for a Bronch please

## 2023-03-22 NOTE — Telephone Encounter (Signed)
PET scan 15 mm superior segment right lower lobe pulmonary nodule is hypermetabolic and consistent with neoplasm. No enlarged or hypermetabolic mediastinal or hilar lymph nodes to suggest metastatic adenopathy.  No findings for pulmonary metastatic disease.  Case discussed with Dr. Tonia Brooms-  Patient aware of results.  Discussed tissue sampling versus referral to radiation oncology for evaluation for possible empiric SBRT as she is not a surgical candidate.  Patient opts for bronchoscopy.  Discussed in detail benefit and risks. (Infection, bleeding, pneumothorax, death, etc) .  Will need to hold Eliquis 2 days prior to procedure.    Please set up for 04/09/23 on Dr. Tonia Brooms Navigational Bronch schedule

## 2023-03-27 ENCOUNTER — Telehealth: Payer: Self-pay | Admitting: Adult Health

## 2023-03-27 NOTE — Telephone Encounter (Signed)
Spoke with the pt  She states afraid of having bronch bc her friends told her that if she has cancer this will cause it to spread  I advised this is untrue  Pt does want to proceed Tammy- it looks like they are needing you to enter the SmartPhrase for bronch in order for them to schedule

## 2023-03-27 NOTE — Telephone Encounter (Signed)
Patient states has questions about Bronch procedure.Would like to speak to Rubye Oaks NP.  Patient phone number is [phone].

## 2023-03-27 NOTE — Telephone Encounter (Signed)
Please schedule the following:  Provider performing procedure:Icard  Diagnosis: Hypermetabolic lung nodule  Which side if for nodule / mass? 15mm RLL (no adenopathy/distant dz) Procedure: Navigational Bronchoscopy   Has patient been spoken to by Provider and given informed consent? Yes  Anesthesia:   Do you need Fluro?   Duration of procedure:   Date: 04/09/23 Alternate Date:   Time: AM/ PM Location:  Does patient have OSA? No  DM? No  Or Latex allergy? No  Medication Restriction/ Anticoagulate/Antiplatelet: On Eliquis will need to hold 48hr prior to procedure.  Pre-op Labs Ordered:determined by Anesthesia Imaging request:   (If, SuperDimension CT Chest, please have STAT courier sent to ENDO)   Will send to [facility] to add anything else he would like

## 2023-03-27 NOTE — Telephone Encounter (Signed)
PET scan 15 mm superior segment right lower lobe pulmonary nodule is hypermetabolic and consistent with neoplasm. No enlarged or hypermetabolic mediastinal or hilar lymph nodes to suggest metastatic adenopathy.  No findings for pulmonary metastatic disease.   Case discussed with Dr. Tonia Brooms-   Patient aware of results.  Discussed tissue sampling versus referral to radiation oncology for evaluation for possible empiric SBRT as she is not a surgical candidate.   Patient opts for bronchoscopy.  Discussed in detail benefit and risks. (Infection, bleeding, pneumothorax, death, etc) .  Will need to hold Eliquis 2 days prior to procedure.      Please set up for 04/09/23 on Dr. Tonia Brooms Navigational Bronch schedule      Provider performing procedure:Icard  Diagnosis: Hypermetabolic lung nodule  Which side if for nodule / mass? 15mm RLL (no adenopathy/distant dz) Procedure: Navigational Bronchoscopy   Has patient been spoken to by Provider and given informed consent? Yes  Anesthesia:   Do you need Fluro?   Duration of procedure:   Date: 04/09/23 Alternate Date:   Time: AM/ PM Location:  Does patient have OSA? No  DM? No  Or Latex allergy? No  Medication Restriction/ Anticoagulate/Antiplatelet: On Eliquis will need to hold 48hr prior to procedure.  Pre-op Labs Ordered:determined by Anesthesia Imaging request:   (If, SuperDimension CT Chest, please have STAT courier sent to ENDO)     Will send to [facility] to add anything else he would like

## 2023-03-29 ENCOUNTER — Encounter: Payer: Self-pay | Admitting: Pulmonary Disease

## 2023-03-29 NOTE — Telephone Encounter (Signed)
Lm/am for patient to call back

## 2023-03-29 NOTE — Telephone Encounter (Signed)
Spoke to the patient and gave bronch info

## 2023-04-05 ENCOUNTER — Encounter (HOSPITAL_COMMUNITY): Payer: Self-pay | Admitting: Pulmonary Disease

## 2023-04-05 NOTE — Progress Notes (Signed)
SDW call  Patient was given pre-op instructions over the phone. Patient verbalized understanding of instructions provided.     PCP - Fatima Sanger, [name] Cardiologist -  Pulmonary:    PPM/ICD - denies Device Orders - na Rep Notified - na   Chest x-ray - 09/25/2022 EKG -  09/25/2022 Stress Test - ECHO - 11/10/2022 Cardiac Cath -   Sleep Study/sleep apnea/CPAP: denies  Non-diabetic  Blood Thinner Instructions: Eliquis, instructed to stop 2 days prior, last dose 04/06/2023 Aspirin Instructions:denies   ERAS Protcol - NPO   COVID TEST- na    Anesthesia review: Yes. HTN, SOB, COPD, asthma, DVT, PE, Vena Cava filter, on eliquis   Patient states she is always short of breath. Denies fever, cough and chest pain over the phone call  Your procedure is scheduled on Monday April 09, 2023  Report to [facility] Main Entrance "A" at  0800  A.M., then check in with the Admitting office.  Call this number if you have problems the morning of surgery:  [phone]   If you have any questions prior to your surgery date call [phone]: Open Monday-Friday 8am-4pm If you experience any cold or flu symptoms such as cough, fever, chills, shortness of breath, etc. between now and your scheduled surgery, please notify us at the above number    Remember:  Do not eat or drink after midnight the night before your surgery  Take these medicines the morning of surgery with A SIP OF WATER:  Breztri, zyrtec, lexapro, prilosec  As needed: Tylenol, albuterol neb/inhaler  As of today, STOP taking any Aspirin (unless otherwise instructed by your surgeon) Aleve, Naproxen, Ibuprofen, Motrin, Advil, Goody's, BC's, all herbal medications, fish oil, and all vitamins.

## 2023-04-06 NOTE — Anesthesia Preprocedure Evaluation (Signed)
Anesthesia Evaluation  Patient identified by MRN, date of birth, ID band Patient awake    Reviewed: Allergy & Precautions, NPO status , Patient's Chart, lab work & pertinent test results, reviewed documented beta blocker date and time   History of Anesthesia Complications (+) PONV and history of anesthetic complications  Airway Mallampati: III  TM Distance: >3 FB     Dental  (+) Edentulous Upper   Pulmonary neg sleep apnea, pneumonia, neg COPD, former smoker, neg PE   breath sounds clear to auscultation       Cardiovascular hypertension, (-) angina (-) CAD, (-) Past MI and (-) Cardiac Stents + Valvular Problems/Murmurs  Rhythm:Regular Rate:Normal     Neuro/Psych neg Seizures  Neuromuscular disease    GI/Hepatic ,GERD  ,,  Endo/Other    Renal/GU Renal disease     Musculoskeletal  (+) Arthritis ,    Abdominal   Peds  Hematology   Anesthesia Other Findings   Reproductive/Obstetrics                             Anesthesia Physical Anesthesia Plan  ASA: 2  Anesthesia Plan: General   Post-op Pain Management: Regional block*   Induction: Intravenous  PONV Risk Score and Plan: 4 or greater and TIVA, Dexamethasone and Ondansetron  Airway Management Planned: Oral ETT  Additional Equipment:   Intra-op Plan:   Post-operative Plan: Extubation in OR  Informed Consent: I have reviewed the patients History and Physical, chart, labs and discussed the procedure including the risks, benefits and alternatives for the proposed anesthesia with the patient or authorized representative who has indicated his/her understanding and acceptance.     Dental advisory given  Plan Discussed with:   Anesthesia Plan Comments: (PAT note written 04/06/2023 by Shonna Chock, PA-C.  )       Anesthesia Quick Evaluation

## 2023-04-06 NOTE — Progress Notes (Signed)
Anesthesia Chart Review:  Case: [id number] Date/Time: 04/09/23 1030   Procedure: ROBOTIC ASSISTED NAVIGATIONAL BRONCHOSCOPY   Anesthesia type: General   Pre-op diagnosis: HYPERMETABOLIC LUNG NODULE   [facility] 3 / [facility]   Surgeons: Josephine Igo, DO       DISCUSSION:  Patient is an 82 year old female scheduled for the above procedure. 01/24/22 bronchoscopy with RLL lavage cytology showed reactive bronchial cells, no malignant cells.    History includes former smoker, COPD (O2 2L/[place], 3L/[place] with activity), asthma, GERD, recurrent DVT (LLE DVT 01/13/19; s/p mechanical thrombectomy left CIV, EIV, CFV, and left femoral vein with balloon angioplasty of left femoral vein, and stent of left CIV, left EIV, and left CFV 05/26/19; subacute to chronic LLE DVT 01/14/21; BLE DVT 02/14/22; age indeterminate left popliteal DVT & chronic left femoral DVT 09/26/22, recommended lifelong anticoagulation), spontaneous left retroperitoneal hematoma (01/13/19), IVC filter (01/13/19, removal 03/24/19). PE (01/28/19), GI bleed (07/2019, cecal AVM s/p APC 08/20/19), cholecystectomy (08/12/21), COVID-19 PNA (admission 05/2020).   Last pulmonology visit was on 03/12/23 with Rubye Oaks, NP. Patient with enlarging RLL nodule. PET scan results were pending at the time, but if positive would be considered for RAD-ONC referral as her severe COPD and oxygen dependence would likely exclude her as a surgical candidate. She was prescribed Omnicef and prednisone taper for COPD exacerbation. Resumed Breztri. Continue Zyrtec, omeprazole, as needed albuterol.  On oxygen 2 L per nasal cannula and 3 L with activity. Since then PET scan results showed hypermetabolic activity in RLL nodule without findings to suggest metastatic adenopathy.  Case was discussed with Dr. Tonia Brooms with the above procedure recommended.She reported improvement in symptoms following treatment.    She reported instructions to hold Eliquis 2 days  prior to surgery, last dose scheduled for 04/06/2023.  Anesthesia team to evaluate on the day of surgery.   VS: Ht 5\' 6"  (1.676 m)   Wt 77.1 kg   BMI 27.44 kg/m  BP Readings from Last 3 Encounters:  03/12/23 (!) 160/88  12/12/22 120/80  10/24/22 120/60   Pulse Readings from Last 3 Encounters:  03/12/23 81  12/12/22 77  10/24/22 100     PROVIDERS: Fatima Sanger, [name]/[place], Zollie Beckers, MD is PCP [facility]) - Wyvonnia Lora, MD is HEM-ONC. Last evaluation 07/28/19. By notes, "03/13/2019 JAK2 sequencing revealed 'No mutations'." - Lemar Livings, MD is vascular surgeon - Audie Box, DO is pulmonologist at [facility]  - Lavella Hammock, MD is urologist ([facility]) - Bernette Redbird, MD is GI [facility])    LABS: Most recent labs results in [facility] include: Lab Results  Component Value Date   WBC 10.5 09/27/2022   HGB 13.7 09/27/2022   HCT 44.8 09/27/2022   PLT 445 (H) 09/27/2022   GLUCOSE 175 (H) 09/27/2022   ALT 13 05/28/2022   AST 19 05/28/2022   NA 138 09/27/2022   K 4.3 09/27/2022   CL 98 09/27/2022   CREATININE 0.73 09/27/2022   BUN 22 09/27/2022   CO2 31 09/27/2022     OTHER: PFTs 11/15/22: FVC 1.72 (59%), post 2.01 (69%). FEV1 0.65 (29%), post 0.72 (33%). DLCO 7.38 (36%), cor 7.31 (36%).    IMAGES: PET Scan 02/28/23: IMPRESSION: 1. 15 mm superior segment right lower lobe pulmonary nodule is hypermetabolic and consistent with neoplasm. 2. No enlarged or hypermetabolic mediastinal or hilar lymph nodes to suggest metastatic adenopathy. 3. No findings for pulmonary metastatic disease. 4. No findings for abdominal/pelvic metastatic disease  or osseous metastatic disease. Aortic Atherosclerosis (ICD10-I70.0).  CT Chest 02/09/23: IMPRESSION: 1. Spiculated right lower lobe nodule with slight growth from 09/26/2022 and worrisome for primary bronchogenic carcinoma. PET is recommended in further evaluation, as  clinically indicated. 2. Aortic atherosclerosis (ICD10-I70.0). Coronary artery calcification. 3. Enlarged pulmonic trunk, indicative of pulmonary arterial hypertension. 4.  Emphysema (ICD10-J43.9).    EKG: 84/29/24: SR     CV: Echo 11/10/22: IMPRESSIONS   1. Left ventricular ejection fraction, by estimation, is 60 to 65%. The  left ventricle has normal function. The left ventricle has no regional  wall motion abnormalities. Left ventricular diastolic parameters are  consistent with Grade I diastolic  dysfunction (impaired relaxation).   2. Right ventricular systolic function is normal. The right ventricular  size is normal. There is normal pulmonary artery systolic pressure. The  estimated right ventricular systolic pressure is 23.8 mmHg.   3. The mitral valve is normal in structure. Trivial mitral valve  regurgitation. No evidence of mitral stenosis.   4. The aortic valve is grossly normal. Aortic valve regurgitation is not  visualized. No aortic stenosis is present.   5. Aortic dilatation noted. There is mild dilatation of the ascending  aorta, measuring 41 mm.   6. The inferior vena cava is normal in size with <50% respiratory  variability, suggesting right atrial pressure of 8 mmHg.    LE Venous US 09/26/22: Summary:  BILATERAL:  -No evidence of popliteal cyst, bilaterally.  RIGHT:  - There is no evidence of deep vein thrombosis in the lower extremity.  LEFT:  - Findings consistent with age indeterminate deep vein thrombosis  involving the left popliteal vein.  - Findings consistent with chronic deep vein thrombosis involving the left  femoral vein.        Cardiac cath 03/02/04:  CONCLUSION:  1.  Essentially normal coronary arteries.  2.  Normal left ventricular function.  3.  The symptoms at this point appear to be non-cardiac in origin.   Past Medical History:  Diagnosis Date   Allergic rhinitis    Anemia due to GI blood loss    Anxiety    Asthma     Chronic airway obstruction, not elsewhere classified    Depressive disorder, not elsewhere classified    DVT (deep venous thrombosis) (HCC)    LLE DVT 01/13/19   Dyspnea    Esophageal reflux    Essential hypertension 08/27/2019   PE (pulmonary thromboembolism) (HCC) 01/29/2019   Pneumonia    Wears partial dentures     Past Surgical History:  Procedure Laterality Date   ABDOMINAL HYSTERECTOMY     ABDOMINAL HYSTERECTOMY     BRONCHIAL WASHINGS  01/24/2022   Procedure: BRONCHIAL WASHINGS;  Surgeon: Lupita Leash, MD;  Location: [facility];  Service: Cardiopulmonary;;   CATARACT EXTRACTION W/ INTRAOCULAR LENS  IMPLANT, BILATERAL     CHOLECYSTECTOMY N/A 08/12/2021   Procedure: LAPAROSCOPIC CHOLECYSTECTOMY;  Surgeon: Quentin Ore, MD;  Location: [facility] ORS;  Service: General;  Laterality: N/A;   COLONOSCOPY     COLONOSCOPY WITH PROPOFOL N/A 08/20/2019   Procedure: COLONOSCOPY WITH PROPOFOL;  Surgeon: Bernette Redbird, MD;  Location: [facility] ENDOSCOPY;  Service: Endoscopy;  Laterality: N/A;   CORONARY ULTRASOUND/IVUS Left 05/26/2019   Procedure: Intravascular Ultrasound/IVUS;  Surgeon: Maeola Harman, MD;  Location: [facility] INVASIVE CV LAB;  Service: Cardiovascular;  Laterality: Left;   CYSTOSCOPY     DILATION AND CURETTAGE OF UTERUS     ECTROPION REPAIR Bilateral 05/12/2021   Procedure:  REPAIR OF ECTROPION BY LATERAL TARSAL STRIP OF BILATERAL LOWER EYE LIDS;  Surgeon: Dairl Ponder, MD;  Location: [facility] OR;  Service: Ophthalmology;  Laterality: Bilateral;   ESOPHAGOGASTRODUODENOSCOPY (EGD) WITH PROPOFOL N/A 08/20/2019   Procedure: ESOPHAGOGASTRODUODENOSCOPY (EGD) WITH PROPOFOL;  Surgeon: Bernette Redbird, MD;  Location: [facility] ENDOSCOPY;  Service: Endoscopy;  Laterality: N/A;   HEMOSTASIS CONTROL  08/20/2019   Procedure: HEMOSTASIS CONTROL;  Surgeon: Bernette Redbird, MD;  Location: [facility] ENDOSCOPY;  Service: Endoscopy;;   IVC FILTER REMOVAL N/A 03/24/2019   Procedure: IVC FILTER REMOVAL;   Surgeon: Maeola Harman, MD;  Location: [facility] INVASIVE CV LAB;  Service: Cardiovascular;  Laterality: N/A;   LACRIMAL TUBE INSERTION Right 05/12/2021   Procedure: RIGHT NASOLACRIMAL DUCT PROBE WITH MINI MONOKA STENT PLACEMENT;  Surgeon: Dairl Ponder, MD;  Location: [facility] OR;  Service: Ophthalmology;  Laterality: Right;   LESION EXCISION WITH COMPLEX REPAIR N/A 05/12/2021   Procedure: NASAL LESION EXCISION;  Surgeon: Dairl Ponder, MD;  Location: [facility] OR;  Service: Ophthalmology;  Laterality: N/A;   LOWER EXTREMITY VENOGRAPHY Left 05/26/2019   Procedure: LOWER EXTREMITY VENOGRAPHY;  Surgeon: Maeola Harman, MD;  Location: [facility] INVASIVE CV LAB;  Service: Cardiovascular;  Laterality: Left;   MULTIPLE TOOTH EXTRACTIONS     NASAL SINUS SURGERY     PERIPHERAL VASCULAR INTERVENTION Left 05/26/2019   Procedure: PERIPHERAL VASCULAR INTERVENTION;  Surgeon: Maeola Harman, MD;  Location: [facility] INVASIVE CV LAB;  Service: Cardiovascular;  Laterality: Left;  lower extremity veins   PERIPHERAL VASCULAR THROMBECTOMY  05/26/2019   Procedure: PERIPHERAL VASCULAR THROMBECTOMY;  Surgeon: Maeola Harman, MD;  Location: [facility] INVASIVE CV LAB;  Service: Cardiovascular;;   PTOSIS REPAIR Bilateral 05/12/2021   Procedure: EXTERNAL PTOSIS REPAIR LEVATOR ADVANCEDMENT/RESECTION OF BILATERAL UPPER EYE LIDS;  Surgeon: Dairl Ponder, MD;  Location: [facility] OR;  Service: Ophthalmology;  Laterality: Bilateral;   TUBAL LIGATION     VENA CAVA FILTER PLACEMENT N/A 01/13/2019   Procedure: INSERTION VENA-CAVA FILTER;  Surgeon: Maeola Harman, MD;  Location: [facility] OR;  Service: Vascular;  Laterality: N/A;   VIDEO BRONCHOSCOPY N/A 01/24/2022   Procedure: VIDEO BRONCHOSCOPY WITHOUT FLUORO;  Surgeon: Lupita Leash, MD;  Location: [facility] ENDOSCOPY;  Service: Cardiopulmonary;  Laterality: N/A;    MEDICATIONS: No current facility-administered medications for this encounter.    acetaminophen  (TYLENOL) 500 MG tablet   albuterol (PROVENTIL) (2.5 MG/3ML) 0.083% nebulizer solution   albuterol (VENTOLIN HFA) 108 (90 Base) MCG/ACT inhaler   apixaban (ELIQUIS) 5 MG TABS tablet   Budeson-Glycopyrrol-Formoterol (BREZTRI AEROSPHERE) 160-9-4.8 MCG/ACT AERO   Budeson-Glycopyrrol-Formoterol (BREZTRI AEROSPHERE) 160-9-4.8 MCG/ACT AERO   cefdinir (OMNICEF) 300 MG capsule   cetirizine (ZYRTEC) 10 MG tablet   escitalopram (LEXAPRO) 20 MG tablet   fluticasone (FLONASE) 50 MCG/ACT nasal spray   furosemide (LASIX) 20 MG tablet   gabapentin (NEURONTIN) 100 MG capsule   LORazepam (ATIVAN) 1 MG tablet   omeprazole (PRILOSEC) 20 MG capsule   predniSONE (DELTASONE) 10 MG tablet   traZODone (DESYREL) 100 MG tablet   triamterene-hydrochlorothiazide (MAXZIDE-25) 37.5-25 MG tablet   Is not currently taking Omnicef, Flonase, Lasix, prednisone.  Shonna Chock, PA-C Surgical Short Stay/Anesthesiology [facility] Phone [phone] [facility] Phone [phone] 04/06/2023 1:41 PM

## 2023-04-09 ENCOUNTER — Ambulatory Visit (HOSPITAL_COMMUNITY): Payer: PPO

## 2023-04-09 ENCOUNTER — Ambulatory Visit (HOSPITAL_COMMUNITY)
Admission: RE | Admit: 2023-04-09 | Discharge: 2023-04-09 | Disposition: A | Discharge status: Home / Self Care | Payer: PPO | Attending: Pulmonary Disease | Admitting: Pulmonary Disease

## 2023-04-09 ENCOUNTER — Encounter (HOSPITAL_COMMUNITY)
Admission: RE | Disposition: A | Discharge status: Home / Self Care | Payer: Self-pay | Source: Home / Self Care | Attending: Pulmonary Disease

## 2023-04-09 ENCOUNTER — Ambulatory Visit (HOSPITAL_BASED_OUTPATIENT_CLINIC_OR_DEPARTMENT_OTHER): Payer: Self-pay | Admitting: Vascular Surgery

## 2023-04-09 ENCOUNTER — Ambulatory Visit (HOSPITAL_COMMUNITY): Payer: PPO | Admitting: Vascular Surgery

## 2023-04-09 DIAGNOSIS — Z95820 Peripheral vascular angioplasty status with implants and grafts: Secondary | ICD-10-CM | POA: Diagnosis not present

## 2023-04-09 DIAGNOSIS — J439 Emphysema, unspecified: Secondary | ICD-10-CM | POA: Diagnosis not present

## 2023-04-09 DIAGNOSIS — K219 Gastro-esophageal reflux disease without esophagitis: Secondary | ICD-10-CM | POA: Diagnosis not present

## 2023-04-09 DIAGNOSIS — Z86711 Personal history of pulmonary embolism: Secondary | ICD-10-CM | POA: Insufficient documentation

## 2023-04-09 DIAGNOSIS — Z7901 Long term (current) use of anticoagulants: Secondary | ICD-10-CM | POA: Diagnosis not present

## 2023-04-09 DIAGNOSIS — J449 Chronic obstructive pulmonary disease, unspecified: Secondary | ICD-10-CM

## 2023-04-09 DIAGNOSIS — Z48813 Encounter for surgical aftercare following surgery on the respiratory system: Secondary | ICD-10-CM | POA: Diagnosis not present

## 2023-04-09 DIAGNOSIS — R911 Solitary pulmonary nodule: Secondary | ICD-10-CM

## 2023-04-09 DIAGNOSIS — Z79899 Other long term (current) drug therapy: Secondary | ICD-10-CM | POA: Diagnosis not present

## 2023-04-09 DIAGNOSIS — Z9981 Dependence on supplemental oxygen: Secondary | ICD-10-CM | POA: Diagnosis not present

## 2023-04-09 DIAGNOSIS — I10 Essential (primary) hypertension: Secondary | ICD-10-CM | POA: Diagnosis not present

## 2023-04-09 DIAGNOSIS — R0989 Other specified symptoms and signs involving the circulatory and respiratory systems: Secondary | ICD-10-CM | POA: Diagnosis not present

## 2023-04-09 DIAGNOSIS — C3431 Malignant neoplasm of lower lobe, right bronchus or lung: Secondary | ICD-10-CM | POA: Insufficient documentation

## 2023-04-09 DIAGNOSIS — J4489 Other specified chronic obstructive pulmonary disease: Secondary | ICD-10-CM | POA: Diagnosis not present

## 2023-04-09 DIAGNOSIS — F32A Depression, unspecified: Secondary | ICD-10-CM | POA: Insufficient documentation

## 2023-04-09 DIAGNOSIS — F419 Anxiety disorder, unspecified: Secondary | ICD-10-CM | POA: Diagnosis not present

## 2023-04-09 DIAGNOSIS — J9610 Chronic respiratory failure, unspecified whether with hypoxia or hypercapnia: Secondary | ICD-10-CM | POA: Diagnosis not present

## 2023-04-09 DIAGNOSIS — Z87891 Personal history of nicotine dependence: Secondary | ICD-10-CM | POA: Insufficient documentation

## 2023-04-09 HISTORY — PX: HEMOSTASIS CONTROL: SHX6838

## 2023-04-09 HISTORY — PX: BRONCHIAL NEEDLE ASPIRATION BIOPSY: SHX5106

## 2023-04-09 HISTORY — PX: FIDUCIAL MARKER PLACEMENT: SHX6858

## 2023-04-09 HISTORY — PX: BRONCHIAL BIOPSY: SHX5109

## 2023-04-09 HISTORY — PX: BRONCHIAL BRUSHINGS: SHX5108

## 2023-04-09 LAB — BASIC METABOLIC PANEL
Anion gap: 6 (ref 5–15)
BUN: 18 mg/dL (ref 8–23)
CO2: 25 mmol/L (ref 22–32)
Calcium: 8.7 mg/dL — ABNORMAL LOW (ref 8.9–10.3)
Chloride: 110 mmol/L (ref 98–111)
Creatinine, Ser: 0.73 mg/dL (ref 0.44–1.00)
GFR, Estimated: >60 mL/min (ref >60)
Glucose, Bld: 96 mg/dL (ref 70–99)
Potassium: 4.2 mmol/L (ref 3.5–5.1)
Sodium: 141 mmol/L (ref 135–145)

## 2023-04-09 LAB — CBC
HCT: 40.5 % (ref 36.0–46.0)
Hemoglobin: 11.5 g/dL — ABNORMAL LOW (ref 12.0–15.0)
MCH: 23.8 pg — ABNORMAL LOW (ref 26.0–34.0)
MCHC: 28.4 g/dL — ABNORMAL LOW (ref 30.0–36.0)
MCV: 83.9 fL (ref 80.0–100.0)
Platelets: 328 10*3/uL (ref 150–400)
RBC: 4.83 MIL/uL (ref 3.87–5.11)
RDW: 20.2 % — ABNORMAL HIGH (ref 11.5–15.5)
WBC: 5.8 10*3/uL (ref 4.0–10.5)
nRBC: 0 % (ref 0.0–0.2)

## 2023-04-09 SURGERY — BRONCHOSCOPY, WITH BIOPSY USING ELECTROMAGNETIC NAVIGATION
Anesthesia: General

## 2023-04-09 MED ORDER — ONDANSETRON HCL 4 MG/2ML IJ SOLN: 4.0000 mg | Freq: Once | INTRAMUSCULAR | Status: DC | PRN

## 2023-04-09 MED ORDER — LACTATED RINGERS IV SOLN
INTRAVENOUS | Status: DC
Start: 2023-04-09 — End: 2023-04-09

## 2023-04-09 MED ORDER — PROPOFOL 10 MG/ML IV BOLUS
INTRAVENOUS | Status: DC | PRN
  Administered 2023-04-09: 150 mg via INTRAVENOUS
  Administered 2023-04-09: 50 mg via INTRAVENOUS

## 2023-04-09 MED ORDER — OXYCODONE HCL 5 MG/5ML PO SOLN: 5.0000 mg | Freq: Once | ORAL | Status: DC | PRN

## 2023-04-09 MED ORDER — ROCURONIUM BROMIDE 10 MG/ML (PF) SYRINGE
PREFILLED_SYRINGE | INTRAVENOUS | Status: DC | PRN
  Administered 2023-04-09: 50 mg via INTRAVENOUS

## 2023-04-09 MED ORDER — ONDANSETRON HCL 4 MG/2ML IJ SOLN
INTRAMUSCULAR | Status: DC | PRN
  Administered 2023-04-09: 4 mg via INTRAVENOUS

## 2023-04-09 MED ORDER — FENTANYL CITRATE (PF) 100 MCG/2ML IJ SOLN: 25.0000 ug | INTRAMUSCULAR | Status: DC | PRN

## 2023-04-09 MED ORDER — ACETAMINOPHEN 500 MG PO TABS
1000.0000 mg | ORAL_TABLET | Freq: Once | ORAL | Status: AC
  Administered 2023-04-09: 1000 mg via ORAL
  Filled 2023-04-09: qty 2

## 2023-04-09 MED ORDER — LIDOCAINE 2% (20 MG/ML) 5 ML SYRINGE
INTRAMUSCULAR | Status: DC | PRN
  Administered 2023-04-09: 60 mg via INTRAVENOUS

## 2023-04-09 MED ORDER — SUGAMMADEX SODIUM 200 MG/2ML IV SOLN
INTRAVENOUS | Status: DC | PRN
  Administered 2023-04-09: 200 mg via INTRAVENOUS

## 2023-04-09 MED ORDER — OXYCODONE HCL 5 MG PO TABS
5.0000 mg | ORAL_TABLET | Freq: Once | ORAL | Status: DC | PRN
Start: 2023-04-09 — End: 2023-04-09

## 2023-04-09 MED ORDER — CHLORHEXIDINE GLUCONATE 0.12 % MT SOLN
15.0000 mL | Freq: Once | OROMUCOSAL | Status: AC
  Administered 2023-04-09: 15 mL via OROMUCOSAL
  Filled 2023-04-09: qty 15

## 2023-04-09 MED ORDER — PROPOFOL 500 MG/50ML IV EMUL
INTRAVENOUS | Status: DC | PRN
  Administered 2023-04-09: 125 ug/kg/min via INTRAVENOUS

## 2023-04-09 SURGICAL SUPPLY — 1 items: Superlock Fiducial Marker IMPLANT

## 2023-04-09 NOTE — Interval H&P Note (Signed)
History and Physical Interval Note:  04/09/2023 10:04 AM  [name]  has presented today for surgery, with the diagnosis of HYPERMETABOLIC LUNG NODULE.  The various methods of treatment have been discussed with the patient and family. After consideration of risks, benefits and other options for treatment, the patient has consented to  Procedure(s): ROBOTIC ASSISTED NAVIGATIONAL BRONCHOSCOPY (N/A) as a surgical intervention.  The patient's history has been reviewed, patient examined, no change in status, stable for surgery.  I have reviewed the patient's chart and labs.  Questions were answered to the patient's satisfaction.     Rachel Bo Quashawn Jewkes

## 2023-04-09 NOTE — Transfer of Care (Signed)
Immediate Anesthesia Transfer of Care Note  Patient: [name]  Procedure(s) Performed: ROBOTIC ASSISTED NAVIGATIONAL BRONCHOSCOPY BRONCHIAL NEEDLE ASPIRATION BIOPSIES BRONCHIAL BRUSHINGS BRONCHIAL BIOPSIES FIDUCIAL MARKER PLACEMENT HEMOSTASIS CONTROL  Patient Location: PACU  Anesthesia Type:General  Level of Consciousness: awake, alert , and oriented  Airway & Oxygen Therapy: Patient Spontanous Breathing and Patient connected to nasal cannula oxygen  Post-op Assessment: Report given to RN and Post -op Vital signs reviewed and stable  Post vital signs: Reviewed and stable  Last Vitals:  Vitals Value Taken Time  BP 135/78 04/09/23 1101  Temp    Pulse 85 04/09/23 1103  Resp 18 04/09/23 1103  SpO2 87 % 04/09/23 1103  Vitals shown include unfiled device data.  Last Pain:  Vitals:   04/09/23 0841  TempSrc:   PainSc: 0-No pain         Complications: No notable events documented.

## 2023-04-09 NOTE — Anesthesia Procedure Notes (Signed)
Procedure Name: Intubation Date/Time: 04/09/2023 10:20 AM  Performed by: Georgianne Fick D, CRNAPre-anesthesia Checklist: Patient identified, Emergency Drugs available, Suction available and Patient being monitored Patient Re-evaluated:Patient Re-evaluated prior to induction Oxygen Delivery Method: Circle System Utilized Preoxygenation: Pre-oxygenation with 100% oxygen Induction Type: IV induction Ventilation: Mask ventilation without difficulty Laryngoscope Size: Miller and 2 Grade View: Grade I Tube type: Oral Number of attempts: 1 Airway Equipment and Method: Stylet and Oral airway Placement Confirmation: ETT inserted through vocal cords under direct vision, positive ETCO2 and breath sounds checked- equal and bilateral Secured at: 22 cm Tube secured with: Tape Dental Injury: Teeth and Oropharynx as per pre-operative assessment

## 2023-04-09 NOTE — Discharge Instructions (Signed)
Flexible Bronchoscopy, Care After This sheet gives you information about how to care for yourself after your test. Your doctor may also give you more specific instructions. If you have problems or questions, contact your doctor. Follow these instructions at home: Eating and drinking Do not eat or drink anything (not even water) for 2 hours after your test, or until your numbing medicine (local anesthetic) wears off. When your numbness is gone and your cough and gag reflexes have come back, you may: Eat only soft foods. Slowly drink liquids. The day after the test, go back to your normal diet. Driving Do not drive for 24 hours if you were given a medicine to help you relax (sedative). Do not drive or use heavy machinery while taking prescription pain medicine. General instructions  Take over-the-counter and prescription medicines only as told by your doctor. Return to your normal activities as told. Ask what activities are safe for you. Do not use any products that have nicotine or tobacco in them. This includes cigarettes and e-cigarettes. If you need help quitting, ask your doctor. Keep all follow-up visits as told by your doctor. This is important. It is very important if you had a tissue sample (biopsy) taken. Get help right away if: You have shortness of breath that gets worse. You get light-headed. You feel like you are going to pass out (faint). You have chest pain. You cough up: More than a little blood. More blood than before. Summary Do not eat or drink anything (not even water) for 2 hours after your test, or until your numbing medicine wears off. Do not use cigarettes. Do not use e-cigarettes. Get help right away if you have chest pain.  This information is not intended to replace advice given to you by your health care provider. Make sure you discuss any questions you have with your health care provider. Document Released: 03/12/2009 Document Revised: 04/27/2017 Document  Reviewed: 06/02/2016 Elsevier Patient Education  2020 [employer]  

## 2023-04-09 NOTE — Research (Signed)
Title: A multi-center, prospective, single-arm, observational study to evaluate real-world outcomes for the shape-sensing Ion endoluminal system  Primary Outcome: Evaluate procedure characteristics and short and long-term patient outcomes following shape-sensing robotic-assisted bronchoscopy (ssRAB) utilizing the Ion Endoluminal System for lung lesion localization or biopsy.   Protocol # / Study Name: ISI-ION-003 Clinical Trials #: [id number] Sponsor: [employer] Principal Investigator: Dr. Elige Radon Icard   Key Features of Ion Endoluminal System (referred to as "Ion") Ion is the first FDA cleared bronchoscopy system that uses fiber optic shape sensing technology to inform on location within the airways. Its catheter/tool channel has a smaller outer diameter (3.5 mm) in comparison to conventional bronchoscopes, allowing it to navigate into the smaller airways of the periphery.     Key Inclusion Criteria Subject is 18 years or older at the time of the procedure Subject is a candidate for a planned, elective RAB lung lesion localization or biopsy procedure in which the Ion Endoluminal System is planned to be utilized.  Subject  able to understand and adhere to study requirements and provide informed consent.   Key Exclusion Criteria Subject is under the care of a Museum/gallery exhibitions officer and is unable to provide informed consent on their own accord.  Subject is participating in an interventional research study or research study with investigational agents with an unknown safety profile that would interfere with participation or the results of this study.  Female subjects who are pregnant or nursing at the time of the index Ion procedure, as determined by standard site practices. Subjects that are incarcerated or institutionalized under court order, or other vulnerable populations.    Previous Clinical Trials Since receiving FDA clearance in Feb 2019, Ion has been adopted  commercially by over 226 centers in the [place], and utilized in over 40,000 procedures.  The first in-human study enrolled 79 subjects with a mean lesion size of 14.8 mm and the overall diagnostic yield was 79.3%, with no adverse events. 17 (58.6%) lesions were reported to have a bronchus sign available on CT imaging.  A multi-center study published results in 2022, with 270 lesions biopsied in 241 patients using Ion. The mean largest cardinal lesion size was 18.86.54mm, and the mean airway generation count was 7.01.6. Asymptomatic pneumothorax occurred in 3.3% of subjects, and 0.8% experienced airway bleeding.   Another study provided preliminary results in 2022, with 87% sensitivity for malignancy, a diagnostic yield of 81%, and a mean lesion size of 16 mm. 75% of biopsy cases were bronchus-sign negative. 4% of subjects experienced pneumothoraces (including those requiring intervention), and 0.8% of subjects experienced airway bleeding requiring wedging or balloon tamponade.  A single-center study captured 131 consecutive procedures of pulmonary biopsy using Ion. The navigational success rate was 98.7%, with an overall diagnostic yield of 81.7%, an overall complication rate of 3%, and a pneumothorax rate of 1.5%.    [facility] Clinical Research Coordinator note:   This visit for Subject [name] with DOB: 1941/02/18 on 04/09/2023 for the above protocol is Visit/Encounter # Pre-procedure, Intra-procedure and Post-procedure, and is for purpose of research.   The consent for this encounter is under:  Protocol Version 1.0 Investigator Brochure Version N/A Consent Version Revision A, dated 14Nov2023 and is currently IRB approved.   Shon Hale expressed continued interest and consent in continuing as a study subject. Subject confirmed that there was no change in contact information (e.g. address, telephone, email). Subject thanked for participation in research and contribution to  science.  In this visit 04/09/2023 the subject will be evaluated by Principal Investigator named Dr. Tonia Brooms. This research coordinator has verified that the above investigator is up to date with his/her training logs.   The Subject was informed that the PI continues to have oversight of the subject's visits and course through relevant discussions, reviews, and also specifically of this visit by routing of this note to the PI.  The research study was discussed with the subject in the pre-operative room. The study was explained in detail including all the contents of the informed consent document. The subject was encouraged to ask questions. All questions were answered to their satisfaction. The IRB approved informed consent was signed, and a copy was given to the subject. After obtaining consent, the subject underwent scheduled procedure using the ion endoluminal system. Data collection was completed per protocol. Refer to paper source subject binder for further details.      Signed by  Verdene Lennert Clinical Research Coordinator / Sub-Investigator  [facility]  [place], [place] 10:55 AM 04/09/2023

## 2023-04-09 NOTE — Op Note (Signed)
Video Bronchoscopy with Robotic Assisted Bronchoscopic Navigation   Date of Operation: 04/09/2023   Pre-op Diagnosis: lung nodule   Post-op Diagnosis: lung nodule   Surgeon: Josephine Igo, DO   Assistants: None   Anesthesia: General endotracheal anesthesia  Operation: Flexible video fiberoptic bronchoscopy with robotic assistance and biopsies.  Estimated Blood Loss: Minimal  Complications: None  Indications and History: [name] is a 82 y.o. female with history of lung nodule . The risks, benefits, complications, treatment options and expected outcomes were discussed with the patient.  The possibilities of pneumothorax, pneumonia, reaction to medication, pulmonary aspiration, perforation of a viscus, bleeding, failure to diagnose a condition and creating a complication requiring transfusion or operation were discussed with the patient who freely signed the consent.    Description of Procedure: The patient was seen in the Preoperative Area, was examined and was deemed appropriate to proceed.  The patient was taken to [facility] endoscopy room 3, identified as [name] and the procedure verified as Flexible Video Fiberoptic Bronchoscopy.  A Time Out was held and the above information confirmed.   Prior to the date of the procedure a high-resolution CT scan of the chest was performed. Utilizing ION software program a virtual tracheobronchial tree was generated to allow the creation of distinct navigation pathways to the patient's parenchymal abnormalities. After being taken to the operating room general anesthesia was initiated and the patient  was orally intubated. The video fiberoptic bronchoscope was introduced via the endotracheal tube and a general inspection was performed which showed normal right and left lung anatomy, aspiration of the bilateral mainstems was completed to remove any remaining secretions. Robotic catheter inserted into patient's endotracheal tube.   Target #1 lung  nodule: The distinct navigation pathways prepared prior to this procedure were then utilized to navigate to patient's lesion identified on CT scan. The robotic catheter was secured into place and the vision probe was withdrawn.  Lesion location was approximated using fluoroscopy and 3D CBCT used for CT-guided needle placement and for peripheral targeting. Under fluoroscopic guidance transbronchial needle brushings, transbronchial needle biopsies, and transbronchial forceps biopsies were performed to be sent for cytology and pathology.   At the end of the procedure a general airway inspection was performed and there was no evidence of active bleeding. The bronchoscope was removed.  The patient tolerated the procedure well. There was no significant blood loss and there were no obvious complications. A post-procedural chest x-ray is pending.  Samples Target #1: 1. Transbronchial needle brushings from RLL 2. Transbronchial Wang needle biopsies from RLL 3. Transbronchial forceps biopsies from RLL  Plans:  The patient will be discharged from the PACU to home when recovered from anesthesia and after chest x-ray is reviewed. We will review the cytology, pathology results with the patient when they become available. Outpatient followup will be with Josephine Igo, DO.   Josephine Igo, DO [facility] 04/09/2023 11:11 AM

## 2023-04-09 NOTE — Anesthesia Postprocedure Evaluation (Signed)
Anesthesia Post Note  Patient: [name]  Procedure(s) Performed: ROBOTIC ASSISTED NAVIGATIONAL BRONCHOSCOPY BRONCHIAL NEEDLE ASPIRATION BIOPSIES BRONCHIAL BRUSHINGS BRONCHIAL BIOPSIES FIDUCIAL MARKER PLACEMENT HEMOSTASIS CONTROL     Patient location during evaluation: PACU Anesthesia Type: General Level of consciousness: awake and alert Pain management: pain level controlled Vital Signs Assessment: post-procedure vital signs reviewed and stable Respiratory status: spontaneous breathing, nonlabored ventilation, respiratory function stable and patient connected to nasal cannula oxygen Cardiovascular status: blood pressure returned to baseline and stable Postop Assessment: no apparent nausea or vomiting Anesthetic complications: no   No notable events documented.  Last Vitals:  Vitals:   04/09/23 1130 04/09/23 1145  BP: 120/68 120/70  Pulse: 85 86  Resp: 17 14  Temp:    SpO2: 92% 93%    Last Pain:  Vitals:   04/09/23 1100  TempSrc:   PainSc: 0-No pain                 Beryle Lathe

## 2023-04-10 ENCOUNTER — Telehealth: Payer: Self-pay | Admitting: Pulmonary Disease

## 2023-04-10 NOTE — Telephone Encounter (Signed)
Patient had Bronch procedure yesterday. States is very dizzy today. States fell two times. Patient phone number is [phone].

## 2023-04-10 NOTE — Telephone Encounter (Signed)
Called and spoke with patient, she states she woke up at 3 am this morning and fell and could not get up.  She has someone with her now.  She is unsure whether it is her BP or virtigo.  She is unable to check her BP at home.  She denies any other symptoms.  Advised to be evaluated in the ER.  She verbalized understanding.  Nothing further needed.  Dr. Tonia Brooms, This is just an FYI, this patient has been dizzy since her bronch.  Advised to go to the ED for evaluation.

## 2023-04-11 ENCOUNTER — Encounter (HOSPITAL_COMMUNITY): Payer: Self-pay | Admitting: Pulmonary Disease

## 2023-04-12 LAB — CYTOLOGY - NON PAP

## 2023-04-19 NOTE — Progress Notes (Signed)
Seeing [name] soon. NSCLC. Will need referral to rad onc.   Thanks,  BLI  Josephine Igo, DO [facility] 04/19/2023 8:14 AM

## 2023-04-20 ENCOUNTER — Telehealth: Payer: PPO | Admitting: Adult Health

## 2023-04-20 ENCOUNTER — Encounter: Payer: Self-pay | Admitting: Adult Health

## 2023-04-20 ENCOUNTER — Other Ambulatory Visit: Payer: Self-pay

## 2023-04-20 DIAGNOSIS — J449 Chronic obstructive pulmonary disease, unspecified: Secondary | ICD-10-CM | POA: Diagnosis not present

## 2023-04-20 DIAGNOSIS — C3431 Malignant neoplasm of lower lobe, right bronchus or lung: Secondary | ICD-10-CM | POA: Diagnosis not present

## 2023-04-20 NOTE — Progress Notes (Signed)
Virtual Visit via Video Note  I connected with [name] on 04/20/23 at  1:30 PM EST by a video enabled telemedicine application and verified that I am speaking with the correct person using two identifiers.  Discussed the use of AI scribe software for clinical note transcription with the patient, who gave verbal consent to proceed.       Location: Patient: Home  Provider: Office    I discussed the limitations of evaluation and management by telemedicine and the availability of in person appointments. The patient expressed understanding and agreed to proceed.  History of Present Illness: 82 year old female former smoker followed for severe COPD and chronic respiratory failure on oxygen.  History of right lung abscess, recurrent PE, DVT on anticoagulation therapy and lung nodule   VTE History  Initial Left DVT 12/2018 (Unprovoked)   Complicated by retroperitoneal bleed (AC stopped and IVC filter placed August 2020 and removed in October 2020.  Acute PE with acute cor pulmonale 01/2019 . Started on Heparin with transition to Eliquis. Post thrombotic syndrome of the left lower extremity December 2020 and underwent mechanical thrombectomy of the left common iliac, left external iliac, left common femoral vein and left femoral vein, balloon angioplasty the left femoral vein and stent of the left common iliac, left external iliac and left common femoral vein. Hx of GI Bleed 08/2019 , Eliquis held briefly and then resumed. Patient stopped Eliquis on her own sometime in late 2021. ~11/2021 restarted Eliquis for UE DVT . Admitted 12/2021 with Lung abscess and recurrent large volume hemoptysis. Eliquis held . Admitted with submassive PE 02/14/23. Eliquis restarted. Admitted 09/26/22 with small small subsegmental emboli in the right lower lobe and right middle lobe ? chronic.. (On Eliquis)   Today's video visit is a 1 month follow-up for COPD exacerbation and hypermetabolic lung nodule.  Last visit patient  was treated for COPD exacerbation with antibiotics and steroid taper.  Patient says she is feeling better her cough and congestion have decreased.  She does continue to be short of breath chronically.  Gets winded with easily with minimum activity.  Remains on 2 L of oxygen at rest and 3 L with activity.  She says she did go to [place] for her birthday says she did okay but did not feel like her breathing was good at that altitude.  She has restarted on Breztri and endorses compliance.  She denies any increased albuterol use.  As above has a history of recurrent PE.  She is on lifelong anticoagulation therapy with Eliquis.  Endorses compliance.  Denies any hemoptysis.  CT chest September 2024 showed increased growth of the right lower lobe lung nodule measuring 1.6 cm x 1.3 cm.  Patient was set up for a PET scan that was done on February 28, 2023 that showed hypermetabolic 15 mm right lower lobe pulmonary nodule with SUV max 7.20.  No enlarged or hypermetabolic mediastinal or hilar lymph nodes to suggest metastatic adenopathy.  Patient was set up for a navigational bronchoscopy done on November 11.  Cytology was positive for non-small cell carcinoma.  We discussed her biopsy results in detail.  We discussed the non-small cell lung cancer early-stage with no evidence of distant disease on PET scan.  We went over potential treatment options.  Patient denies any hemoptysis.  Unintentional weight loss.  She did state post bronchoscopy she had difficulty with dizziness for about 4 to 5 days.  This has improved.   Observations/Objective:   TEST/EVENTS :  CT chest and November 17, 2021 showed emphysema no suspicious pulmonary nodules or masses. No consolidation.    02/13/22>> CT angio chest: Submassive PE, near complete resolution of RLL PNA. 02/14/22>> Echo: EF 70-75%, RV systolic function is severely reduced. 02/14/22>> bilateral lower extremity Doppler: DVT right femoral/popliteal/tibial/peroneal, left femoral  vein   01/23/22>> CTA chest: New large alveolar infiltrate in the right lower lobe with cavitary lesion.  No PE. 01/25/22 >> barium esophagogram: No aspiration observed-no strictures/masses. 01/24/22 >> BAL cytology: No malignant cells.    CT chest September 26, 2022 showed resolved cavitary lesion in the right lower lobe.  Enlarged right lower lobe nodule measuring 1.3 cm previously 1.  0 cm.  Bilateral breast implants with intracapsular rupture, emphysema, small subsegmental emboli in the right lower lobe and right middle lobe.  ? chronic.   CT chest February 09, 2023 spiculated right lower lobe nodule slight growth from April 2024 worrisome for bronchogenic carcinoma.   Venous Doppler February 14, 2022 acute DVT right femoral vein, right popliteal vein, right popliteal tibial veins and right peroneal veins, acute DVT involving the left femoral vein   Venous Doppler 09/26/22 negative DVT right lower extremity, DVT left popliteal vein age-indeterminate, chronic deep venous thrombosis involving the left femoral vein    2D echo February 14, 2022 EF 70 to 75%, grade 1 diastolic dysfunction, right ventricular systolic function severely reduced   Echo November 10, 2022 EF 60 to 65%, grade 1 diastolic dysfunction, RV SF normal,, RV size normal, normal pulmonary artery systolic pressure.   PFT November 16, 2022 showed  severe COPD with FEV1 at 29%, ratio 38, FVC 59%, positive bronchodilator response with FEV1 postbronchodilator at 33%, ratio 36, FVC 69%, DLCO 36%  04/09/23 Nav Bronch- RLL lesion -NSCLC- carcinoma is positive with TTF-1 and negative with p40 and cytokeratin 5/6 consistent with lung adenocarcinoma.   Assessment and Plan: Assessment and Plan    Chronic Obstructive Pulmonary Disease (COPD)   Recent COPD exacerbation now resolved.  Previous treatments with antibiotics and prednisone proved effective. She is currently on Breztri twice daily  We will continue Breztri twice daily and maintain oxygen  therapy.  Use albuterol inhaler and nebulizer as needed  Non-Small Cell Lung Cancer (NSCLC)   She has been newly diagnosed with NSCLC in the right lung, confirmed by biopsy. A PET scan indicates no metastasis. Given her poor lung function, oxygen dependence-she would not be an optimal surgical candidate.  We discussed referral to radiation oncology.  Case has been discussed with Dr. Tonia Brooms.  Patient is agreement to follow-up with radiation oncology to see if she is a candidate for SBRT.  History of recurrent PE continue on anticoagulation therapy.  Patient education given  Dizziness  -patient experienced some dizziness few days post bronchoscopy that seems to have improved Advised to follow-up with primary care for ongoing management..  General Health Maintenance   We encourage her to maintain regular follow-ups with her primary care physician for ongoing health issues.  Follow-up   Follow-up with Dr. Tonia Brooms as planned    Follow Up Instructions:    I discussed the assessment and treatment plan with the patient. The patient was provided an opportunity to ask questions and all were answered. The patient agreed with the plan and demonstrated an understanding of the instructions.   The patient was advised to call back or seek an in-person evaluation if the symptoms worsen or if the condition fails to improve as anticipated.  I provided 32  minutes of non-face-to-face time during this encounter.   Rubye Oaks, NP

## 2023-04-20 NOTE — Patient Instructions (Addendum)
Refer to Radiation Oncology .  Continue Breztri 2 puffs Twice daily  , rinse after use  Albuterol inhaler or neb As needed  Continue on Zyrtec 10mg  daily  Continue on Omeprazole daily  Continue on Eliquis.  Continue on Oxygen 2l/m, increase 3l/m with activity.  Activity as tolerated.  Follow up with Dr. Tonia Brooms as planned  and As needed   Please contact office for sooner follow up if symptoms do not improve or worsen or seek emergency care

## 2023-04-20 NOTE — Progress Notes (Signed)
The proposed treatment discussed in conference is for discussion purpose only and is not a binding recommendation.  The patients have not been physically examined, or presented with their treatment options.  Therefore, final treatment plans cannot be decided.  

## 2023-04-23 DIAGNOSIS — R42 Dizziness and giddiness: Secondary | ICD-10-CM | POA: Diagnosis not present

## 2023-04-23 DIAGNOSIS — D491 Neoplasm of unspecified behavior of respiratory system: Secondary | ICD-10-CM | POA: Diagnosis not present

## 2023-04-23 DIAGNOSIS — J449 Chronic obstructive pulmonary disease, unspecified: Secondary | ICD-10-CM | POA: Diagnosis not present

## 2023-04-23 DIAGNOSIS — R0602 Shortness of breath: Secondary | ICD-10-CM | POA: Diagnosis not present

## 2023-04-23 DIAGNOSIS — L309 Dermatitis, unspecified: Secondary | ICD-10-CM | POA: Diagnosis not present

## 2023-05-01 NOTE — Progress Notes (Signed)
Location of tumor and Histology per Pathology Report: Malignant neoplasm of lower lobe of right lung (HCC) C34.31 Non-Small Cell Lung Cancer (NSCLC)   Biopsy:    Past/Anticipated interventions by surgeon, if any:     Past/Anticipated interventions by medical oncology, if any: Chemotherapy ***    Pain issues, if any:  {:18581} {PAIN DESCRIPTION:[id number]}  SAFETY ISSUES: Prior radiation? {:18581} Pacemaker/ICD? {:18581} Possible current pregnancy? no Is the patient on methotrexate? {:18581}  Current Complaints / other details:  ***     ***

## 2023-05-01 NOTE — Progress Notes (Signed)
Radiation Oncology         [phone] ________________________________  Initial Outpatient Consultation  Name: [name] MRN: [id number]  Date: 05/02/2023  DOB: 04/28/1941  HQ:IONGEXB, [name] Lambert, [name]  Icard, Rachel Bo, DO   REFERRING PHYSICIAN: Josephine Igo, DO  DIAGNOSIS: {There were no encounter diagnoses. (Refresh or delete this SmartLink)}  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   HISTORY OF PRESENT ILLNESS::[name] is a 82 y.o. female who is accompanied by ***. she is seen as a courtesy of Dr. Tonia Brooms for an opinion concerning radiation therapy as part of management for her recently diagnosed right lung cancer. The patient has been followed by [facility] for several years now in the setting of her smoking history, severe COPD, chronic respiratory failure, and recurrent PE and DVT. She is also oxygen dependent and requires 2 L at rest and 3 L with activity, and she is on lifelong anticoagulation therapy with Eliquis for her history of recurrent PE.   The patient was hospitalized in August of 2023 after presenting with worsening cough/hemoptysis. She had a CTA of the chest performed upon admission at that time on 01/23/22 which showed a new large alveolar infiltrate in the right lower lobe suggestive of PNA, and a large cavitary lesion in the superior segment of right lower lobe possibly reflecting a lung abscess. She subsequently underwent a bronchoscopy on 08/29. RLL lavage collected at that time was submitted for cytology and came back negative for malignancy.   She later presented for a follow-up chest CT on 09/26/22 which showed resolution of the cavitary lesion in the RLL, but enlargement of a RLL nodule measuring 1.3 cm, previously 1.0 cm. Small subsegmental emboli in the right lower lobe and right middle lobe were also demonstrated, which were noted to be perceived as chronic in etiology.    She continued with surveillance  imaging and presented for a follow-up chest CT on 02/09/23 which showed a slight increase in size of the RLL nodule, measuring 1.3 x 1.6 cm, previously measuring 1.0 x 1.2 cm, concerning for bronchogenic carcinoma.   A PET scan was subsequently performed on 02/28/23 which showed hypermetabolism associated with the 15 mm RLL nodule. PET otherwise showed no evidence of hypermetabolic mediastinal or hilar metastatic lymphadenopathy, pulmonary metastatic disease, osseous metastatic disease, or evidence of distant metastatic in the abdomen or pelvis.   Approximately 1 month later, the patient presented to pulmonology on 03/12/23 with 2-3 weeks of cough, congestion, and wheezing. She was subsequently given empiric antibiotics and steroids for recurrent COPD exacerbation. (She was also seen by her PCP a week prior to this and was given a prednisone injection). Her recent scans were reviewed at that time, and she was advised to proceed with tissue sampling.   She was subsequently referred to Dr. Tonia Brooms and underwent a bronchoscopy on 04/09/23. FNA of the RLL showed findings consistent with non-small cell carcinoma, consistent with adenocarcinoma by IHC.   Given her poor lung function and oxygen dependence, she is not a good surgical candidate. She has been subsequently referred to [place] for consideration of SBRT to the RLL which we will discuss in detail today.    PREVIOUS RADIATION THERAPY: No  PAST MEDICAL HISTORY:  Past Medical History:  Diagnosis Date   Allergic rhinitis    Anemia due to GI blood loss    Anxiety    Asthma    Chronic airway obstruction, not elsewhere classified  Depressive disorder, not elsewhere classified    DVT (deep venous thrombosis) (HCC)    LLE DVT 01/13/19   Dyspnea    Esophageal reflux    Essential hypertension 08/27/2019   PE (pulmonary thromboembolism) (HCC) 01/29/2019   Pneumonia    Wears partial dentures     PAST SURGICAL HISTORY: Past Surgical History:   Procedure Laterality Date   ABDOMINAL HYSTERECTOMY     ABDOMINAL HYSTERECTOMY     BRONCHIAL BIOPSY  04/09/2023   Procedure: BRONCHIAL BIOPSIES;  Surgeon: Josephine Igo, DO;  Location: [facility];  Service: Pulmonary;;   BRONCHIAL BRUSHINGS  04/09/2023   Procedure: BRONCHIAL BRUSHINGS;  Surgeon: Josephine Igo, DO;  Location: [facility];  Service: Pulmonary;;   BRONCHIAL NEEDLE ASPIRATION BIOPSY  04/09/2023   Procedure: BRONCHIAL NEEDLE ASPIRATION BIOPSIES;  Surgeon: Josephine Igo, DO;  Location: [facility];  Service: Pulmonary;;   BRONCHIAL WASHINGS  01/24/2022   Procedure: BRONCHIAL WASHINGS;  Surgeon: Lupita Leash, MD;  Location: [facility];  Service: Cardiopulmonary;;   CATARACT EXTRACTION W/ INTRAOCULAR LENS  IMPLANT, BILATERAL     CHOLECYSTECTOMY N/A 08/12/2021   Procedure: LAPAROSCOPIC CHOLECYSTECTOMY;  Surgeon: Quentin Ore, MD;  Location: [facility] ORS;  Service: General;  Laterality: N/A;   COLONOSCOPY     COLONOSCOPY WITH PROPOFOL N/A 08/20/2019   Procedure: COLONOSCOPY WITH PROPOFOL;  Surgeon: Bernette Redbird, MD;  Location: [facility] ENDOSCOPY;  Service: Endoscopy;  Laterality: N/A;   CORONARY ULTRASOUND/IVUS Left 05/26/2019   Procedure: Intravascular Ultrasound/IVUS;  Surgeon: Maeola Harman, MD;  Location: [facility] INVASIVE CV LAB;  Service: Cardiovascular;  Laterality: Left;   CYSTOSCOPY     DILATION AND CURETTAGE OF UTERUS     ECTROPION REPAIR Bilateral 05/12/2021   Procedure: REPAIR OF ECTROPION BY LATERAL TARSAL STRIP OF BILATERAL LOWER EYE LIDS;  Surgeon: Dairl Ponder, MD;  Location: [facility] OR;  Service: Ophthalmology;  Laterality: Bilateral;   ESOPHAGOGASTRODUODENOSCOPY (EGD) WITH PROPOFOL N/A 08/20/2019   Procedure: ESOPHAGOGASTRODUODENOSCOPY (EGD) WITH PROPOFOL;  Surgeon: Bernette Redbird, MD;  Location: [facility] ENDOSCOPY;  Service: Endoscopy;  Laterality: N/A;   FIDUCIAL MARKER PLACEMENT  04/09/2023   Procedure: FIDUCIAL MARKER PLACEMENT;  Surgeon: Josephine Igo, DO;  Location: [facility];  Service: Pulmonary;;   HEMOSTASIS CONTROL  08/20/2019   Procedure: HEMOSTASIS CONTROL;  Surgeon: Bernette Redbird, MD;  Location: [facility] ENDOSCOPY;  Service: Endoscopy;;   HEMOSTASIS CONTROL  04/09/2023   Procedure: HEMOSTASIS CONTROL;  Surgeon: Josephine Igo, DO;  Location: [facility] ENDOSCOPY;  Service: Pulmonary;;   IVC FILTER REMOVAL N/A 03/24/2019   Procedure: IVC FILTER REMOVAL;  Surgeon: Maeola Harman, MD;  Location: [facility] INVASIVE CV LAB;  Service: Cardiovascular;  Laterality: N/A;   LACRIMAL TUBE INSERTION Right 05/12/2021   Procedure: RIGHT NASOLACRIMAL DUCT PROBE WITH MINI MONOKA STENT PLACEMENT;  Surgeon: Dairl Ponder, MD;  Location: [facility] OR;  Service: Ophthalmology;  Laterality: Right;   LESION EXCISION WITH COMPLEX REPAIR N/A 05/12/2021   Procedure: NASAL LESION EXCISION;  Surgeon: Dairl Ponder, MD;  Location: [facility] OR;  Service: Ophthalmology;  Laterality: N/A;   LOWER EXTREMITY VENOGRAPHY Left 05/26/2019   Procedure: LOWER EXTREMITY VENOGRAPHY;  Surgeon: Maeola Harman, MD;  Location: [facility] INVASIVE CV LAB;  Service: Cardiovascular;  Laterality: Left;   MULTIPLE TOOTH EXTRACTIONS     NASAL SINUS SURGERY     PERIPHERAL VASCULAR INTERVENTION Left 05/26/2019   Procedure: PERIPHERAL VASCULAR INTERVENTION;  Surgeon: Maeola Harman, MD;  Location: [facility] INVASIVE CV LAB;  Service: Cardiovascular;  Laterality:  Left;  lower extremity veins   PERIPHERAL VASCULAR THROMBECTOMY  05/26/2019   Procedure: PERIPHERAL VASCULAR THROMBECTOMY;  Surgeon: Maeola Harman, MD;  Location: [facility] INVASIVE CV LAB;  Service: Cardiovascular;;   PTOSIS REPAIR Bilateral 05/12/2021   Procedure: EXTERNAL PTOSIS REPAIR LEVATOR ADVANCEDMENT/RESECTION OF BILATERAL UPPER EYE LIDS;  Surgeon: Dairl Ponder, MD;  Location: [facility] OR;  Service: Ophthalmology;  Laterality: Bilateral;   TUBAL LIGATION     VENA CAVA FILTER PLACEMENT N/A 01/13/2019    Procedure: INSERTION VENA-CAVA FILTER;  Surgeon: Maeola Harman, MD;  Location: [facility] OR;  Service: Vascular;  Laterality: N/A;   VIDEO BRONCHOSCOPY N/A 01/24/2022   Procedure: VIDEO BRONCHOSCOPY WITHOUT FLUORO;  Surgeon: Lupita Leash, MD;  Location: [facility] ENDOSCOPY;  Service: Cardiopulmonary;  Laterality: N/A;    FAMILY HISTORY:  Family History  Problem Relation Age of Onset   Diabetes Mother    Coronary artery disease Mother    Emphysema Father    Coronary artery disease Brother     SOCIAL HISTORY:  Social History   Tobacco Use   Smoking status: Former    Current packs/day: 0.00    Average packs/day: 0.3 packs/day for 40.0 years (10.0 ttl pk-yrs)    Types: Cigarettes    Start date: 53    Quit date: 2000    Years since quitting: 24.9   Smokeless tobacco: Never  Vaping Use   Vaping status: Never Used  Substance Use Topics   Alcohol use: No   Drug use: No    ALLERGIES:  Allergies  Allergen Reactions   Pneumococcal Vaccines Shortness Of Breath and Other (See Comments)    Couldn't breathe    Clarithromycin Swelling   Estradiol Rash    Reaction to cream   Sulfa Antibiotics Nausea And Vomiting and Swelling   Doxycycline Hyclate Other (See Comments)    stomach distress   Pneumococcal Polysaccharide Vaccine Other (See Comments)    Unknown   Sulfacetamide Sodium-Sulfur Other (See Comments)   Sulfonamide Derivatives Swelling    MEDICATIONS:  Current Outpatient Medications  Medication Sig Dispense Refill   acetaminophen (TYLENOL) 500 MG tablet Take 500 mg by mouth every 6 (six) hours as needed for headache (pain).     albuterol (PROVENTIL) (2.5 MG/3ML) 0.083% nebulizer solution Take 3 mLs (2.5 mg total) by nebulization every 6 (six) hours as needed for wheezing or shortness of breath. 75 mL 12   albuterol (VENTOLIN HFA) 108 (90 Base) MCG/ACT inhaler Inhale 1-2 puffs into the lungs every 6 (six) hours as needed for wheezing or shortness of breath.      apixaban (ELIQUIS) 5 MG TABS tablet Take 5 mg by mouth 2 (two) times daily.     Budeson-Glycopyrrol-Formoterol (BREZTRI AEROSPHERE) 160-9-4.8 MCG/ACT AERO Inhale 2 puffs into the lungs in the morning and at bedtime. 10.7 g 2   Budeson-Glycopyrrol-Formoterol (BREZTRI AEROSPHERE) 160-9-4.8 MCG/ACT AERO Inhale 2 puffs into the lungs in the morning and at bedtime.     cefdinir (OMNICEF) 300 MG capsule Take 1 capsule (300 mg total) by mouth 2 (two) times daily. (Patient not taking: Reported on 04/05/2023) 14 capsule 0   cetirizine (ZYRTEC) 10 MG tablet Take 10 mg by mouth daily.     escitalopram (LEXAPRO) 20 MG tablet Take 20 mg by mouth daily.     fluticasone (FLONASE) 50 MCG/ACT nasal spray Place 2 sprays into both nostrils daily. (Patient not taking: Reported on 04/05/2023) 9.9 mL 0   furosemide (LASIX) 20 MG tablet Take 20 mg by  mouth daily as needed for fluid or edema. Takes when having swelling. (Patient not taking: Reported on 03/12/2023)     gabapentin (NEURONTIN) 100 MG capsule Take 100 mg by mouth at bedtime.     LORazepam (ATIVAN) 1 MG tablet Take 1 mg by mouth at bedtime.     omeprazole (PRILOSEC) 20 MG capsule Take 20 mg by mouth 2 (two) times daily before a meal.     predniSONE (DELTASONE) 10 MG tablet 4 tabs for 2 days, then 3 tabs for 2 days, 2 tabs for 2 days, then 1 tab for 2 days, then stop (Patient not taking: Reported on 04/05/2023) 20 tablet 0   traZODone (DESYREL) 100 MG tablet Take 100 mg by mouth at bedtime.     triamterene-hydrochlorothiazide (MAXZIDE-25) 37.5-25 MG tablet Take 0.5 tablets by mouth daily.     No current facility-administered medications for this encounter.    REVIEW OF SYSTEMS:  A 10+ POINT REVIEW OF SYSTEMS WAS OBTAINED including neurology, dermatology, psychiatry, cardiac, respiratory, lymph, extremities, GI, GU, musculoskeletal, constitutional, reproductive, HEENT. ***   PHYSICAL EXAM:  vitals were not taken for this visit.   General: Alert and oriented, in  no acute distress HEENT: Head is normocephalic. Extraocular movements are intact. Oropharynx is clear. Neck: Neck is supple, no palpable cervical or supraclavicular lymphadenopathy. Heart: Regular in rate and rhythm with no murmurs, rubs, or gallops. Chest: Clear to auscultation bilaterally, with no rhonchi, wheezes, or rales. Abdomen: Soft, nontender, nondistended, with no rigidity or guarding. Extremities: No cyanosis or edema. Lymphatics: see Neck Exam Skin: No concerning lesions. Musculoskeletal: symmetric strength and muscle tone throughout. Neurologic: Cranial nerves II through XII are grossly intact. No obvious focalities. Speech is fluent. Coordination is intact. Psychiatric: Judgment and insight are intact. Affect is appropriate. ***  ECOG = ***  0 - Asymptomatic (Fully active, able to carry on all predisease activities without restriction)  1 - Symptomatic but completely ambulatory (Restricted in physically strenuous activity but ambulatory and able to carry out work of a light or sedentary nature. For example, light housework, office work)  2 - Symptomatic, <50% in bed during the day (Ambulatory and capable of all self care but unable to carry out any work activities. Up and about more than 50% of waking hours)  3 - Symptomatic, >50% in bed, but not bedbound (Capable of only limited self-care, confined to bed or chair 50% or more of waking hours)  4 - Bedbound (Completely disabled. Cannot carry on any self-care. Totally confined to bed or chair)  5 - Death   Santiago Glad MM, Creech RH, Tormey DC, et al. [phone]). "Toxicity and response criteria of the [facility]". Am. Evlyn Clines. Oncol. 5 (6): 649-55  LABORATORY DATA:  Lab Results  Component Value Date   WBC 5.8 04/09/2023   HGB 11.5 (L) 04/09/2023   HCT 40.5 04/09/2023   MCV 83.9 04/09/2023   PLT 328 04/09/2023   NEUTROABS 6.8 09/25/2022   Lab Results  Component Value Date   NA 141 04/09/2023   K 4.2  04/09/2023   CL 110 04/09/2023   CO2 25 04/09/2023   GLUCOSE 96 04/09/2023   BUN 18 04/09/2023   CREATININE 0.73 04/09/2023   CALCIUM 8.7 (L) 04/09/2023      RADIOGRAPHY: DG CHEST PORT 1 VIEW  Result Date: 04/09/2023 CLINICAL DATA:  Post bronchoscopy. EXAM: PORTABLE CHEST 1 VIEW COMPARISON:  PET CT 02/28/2023 FINDINGS: No evidence of pneumothorax post bronchoscopy. Low lung volumes with bronchovascular  crowding. Fiducial marker in the right perihilar region likely corresponding to nodule on prior PET, not well delineated on the current exam. Thin curvilinear wire in the left infrahilar region is chronic. No significant pleural effusion. The heart is normal in size with grossly stable mediastinal contours allowing for inter lordotic positioning. IMPRESSION: 1. No pneumothorax post bronchoscopy. 2. Fiducial marker on the right likely corresponding to known nodule, nodule not well seen on the current exam. 3. Low lung volumes with bronchovascular crowding. Electronically Signed   By: Narda Rutherford M.D.   On: 04/09/2023 19:20   DG C-ARM BRONCHOSCOPY  Result Date: 04/09/2023 C-ARM BRONCHOSCOPY: Fluoroscopy was utilized by the requesting physician.  No radiographic interpretation.      IMPRESSION: 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   ***  Today, I talked to the patient and family about the findings and work-up thus far.  We discussed the natural history of *** and general treatment, highlighting the role of radiotherapy in the management.  We discussed the available radiation techniques, and focused on the details of logistics and delivery.  We reviewed the anticipated acute and late sequelae associated with radiation in this setting.  The patient was encouraged to ask questions that I answered to the best of my ability. *** A patient consent form was discussed and signed.  We retained a copy for our records.  The patient would like to proceed with radiation  and will be scheduled for CT simulation.  PLAN: ***    *** minutes of total time was spent for this patient encounter, including preparation, face-to-face counseling with the patient and coordination of care, physical exam, and documentation of the encounter.   ------------------------------------------------  Billie Lade, PhD, MD  This document serves as a record of services personally performed by Antony Blackbird, MD. It was created on his behalf by Neena Rhymes, a trained medical scribe. The creation of this record is based on the scribe's personal observations and the provider's statements to them. This document has been checked and approved by the attending provider.

## 2023-05-02 ENCOUNTER — Ambulatory Visit
Admission: RE | Admit: 2023-05-02 | Discharge: 2023-05-02 | Disposition: A | Discharge status: Home / Self Care | Payer: PPO | Source: Ambulatory Visit | Attending: Radiation Oncology | Admitting: Radiation Oncology

## 2023-05-02 ENCOUNTER — Encounter: Payer: Self-pay | Admitting: Radiation Oncology

## 2023-05-02 VITALS — BP 112/84 | HR 59 | Temp 97.5°F | Resp 20 | Ht 66.0 in | Wt 169.0 lb

## 2023-05-02 DIAGNOSIS — J441 Chronic obstructive pulmonary disease with (acute) exacerbation: Secondary | ICD-10-CM | POA: Diagnosis not present

## 2023-05-02 DIAGNOSIS — K219 Gastro-esophageal reflux disease without esophagitis: Secondary | ICD-10-CM | POA: Diagnosis not present

## 2023-05-02 DIAGNOSIS — Z86718 Personal history of other venous thrombosis and embolism: Secondary | ICD-10-CM | POA: Insufficient documentation

## 2023-05-02 DIAGNOSIS — Z79899 Other long term (current) drug therapy: Secondary | ICD-10-CM | POA: Insufficient documentation

## 2023-05-02 DIAGNOSIS — Z9981 Dependence on supplemental oxygen: Secondary | ICD-10-CM | POA: Insufficient documentation

## 2023-05-02 DIAGNOSIS — Z87891 Personal history of nicotine dependence: Secondary | ICD-10-CM | POA: Diagnosis not present

## 2023-05-02 DIAGNOSIS — I10 Essential (primary) hypertension: Secondary | ICD-10-CM | POA: Insufficient documentation

## 2023-05-02 DIAGNOSIS — Z86711 Personal history of pulmonary embolism: Secondary | ICD-10-CM | POA: Insufficient documentation

## 2023-05-02 DIAGNOSIS — Z803 Family history of malignant neoplasm of breast: Secondary | ICD-10-CM | POA: Insufficient documentation

## 2023-05-02 DIAGNOSIS — C3431 Malignant neoplasm of lower lobe, right bronchus or lung: Secondary | ICD-10-CM | POA: Insufficient documentation

## 2023-05-03 ENCOUNTER — Ambulatory Visit
Admission: RE | Admit: 2023-05-03 | Discharge: 2023-05-03 | Disposition: A | Discharge status: Home / Self Care | Payer: PPO | Source: Ambulatory Visit | Attending: Radiation Oncology | Admitting: Radiation Oncology

## 2023-05-03 DIAGNOSIS — Z510 Encounter for antineoplastic radiation therapy: Secondary | ICD-10-CM | POA: Diagnosis not present

## 2023-05-03 DIAGNOSIS — C3431 Malignant neoplasm of lower lobe, right bronchus or lung: Secondary | ICD-10-CM | POA: Insufficient documentation

## 2023-05-03 DIAGNOSIS — Z87891 Personal history of nicotine dependence: Secondary | ICD-10-CM | POA: Diagnosis not present

## 2023-05-07 ENCOUNTER — Ambulatory Visit: Payer: PPO | Admitting: Pulmonary Disease

## 2023-05-07 ENCOUNTER — Encounter: Payer: Self-pay | Admitting: Pulmonary Disease

## 2023-05-07 VITALS — BP 122/62 | HR 88 | Ht 66.0 in | Wt 164.0 lb

## 2023-05-07 DIAGNOSIS — J449 Chronic obstructive pulmonary disease, unspecified: Secondary | ICD-10-CM

## 2023-05-07 DIAGNOSIS — J9611 Chronic respiratory failure with hypoxia: Secondary | ICD-10-CM | POA: Diagnosis not present

## 2023-05-07 DIAGNOSIS — Z86711 Personal history of pulmonary embolism: Secondary | ICD-10-CM

## 2023-05-07 NOTE — Patient Instructions (Signed)
Thank you for visiting Dr. Tonia Brooms at [facility]. Today we recommend the following:  Breztri inhaler samples  I have sent a message to pharmacy to come up with a plan for the eliquis.   Return in about 3 months (around 08/05/2023) for with APP or Dr. Tonia Brooms.    Please do your part to reduce the spread of COVID-19.

## 2023-05-07 NOTE — Progress Notes (Signed)
Synopsis: Referred in April 2024 for shortness of breath by Fatima Sanger, [name]  Subjective:   PATIENT ID: [name] GENDER: female DOB: [birth year], MRN: [id number]  Chief Complaint  Patient presents with   Follow-up    Fatigue, pt currently on 2 L o2. Still on breztri     This is a 82 year old female, past medical history of DVT, COPD, lung abscess treated with antibiotics.  She had a hospitalization in which she was treated with antimicrobials.  She has had follow-up chest x-rays.  She is here today complaints of shortness of breath.  She still has dyspnea on exertion.  She quit smoking several years ago.  She is currently on triple therapy inhaler regimen.  She has not had follow-up CT imaging.  OV 12/12/2022: Patient here today for COPD follow-up.  She had pulmonary function test completed which showed severe stage III COPD with FEV1 of 33% predicted postbronchodilator response.  She had repeat follow-up CT imaging which demonstrates stability of her right lower lobe pulmonary nodule.  We reviewed her PFTs and CT imaging today in the office.  Echocardiogram shows grade 1 diastolic dysfunction with a normal left ventricular ejection fraction.  She is on current triple therapy inhaler regimen.  She has a history of pulmonary embolism.  She is currently on anticoagulation and using her inhaler daily.  She is concerned about cost related to both Eliquis and the [employer]. We talked about [employer]'s capping of inhaler cost $35.  Hopefully will be able to help her through this time with finances to help maintain on medications.  She recently had an exacerbation that required antibiotics steroids and a visit to urgent care.  Unfortunately she has had 2 exacerbations this year.  But she is recovered from them quickly unfortunately she still does have significant shortness of breath with minimal exertion.  She is concerned about this and has not completed pulmonary rehab.  OV 05/07/2023: Here  today for follow-up.  Patient was last seen by me for bronchoscopy and biopsy.Patient was taken for robotic assisted navigation bronchoscopy on 04/09/2023.  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  She was referred to radiation oncology and has already started her SBRT simulation with radiation.  From a respiratory standpoint she is doing okay.  Still on oxygen supplementation.  She is scheduled to start radiation on the 17th and is anxious about that.  She is worried about her ongoing count of continued decline.  Also concerned about running out of Eliquis before the end of the year.    Past Medical History:  Diagnosis Date   Allergic rhinitis    Anemia due to GI blood loss    Anxiety    Asthma    Chronic airway obstruction, not elsewhere classified    Depressive disorder, not elsewhere classified    DVT (deep venous thrombosis) (HCC)    LLE DVT 01/13/19   Dyspnea    Esophageal reflux    Essential hypertension 08/27/2019   PE (pulmonary thromboembolism) (HCC) 01/29/2019   Pneumonia    Wears partial dentures      Family History  Problem Relation Age of Onset   Diabetes Mother    Coronary artery disease Mother    Emphysema Father    Cancer Sister    Cancer Sister    Cancer Brother    Coronary artery disease Brother      Past Surgical History:  Procedure Laterality Date   ABDOMINAL  HYSTERECTOMY     ABDOMINAL HYSTERECTOMY     BRONCHIAL BIOPSY  04/09/2023   Procedure: BRONCHIAL BIOPSIES;  Surgeon: Josephine Igo, DO;  Location: [facility];  Service: Pulmonary;;   BRONCHIAL BRUSHINGS  04/09/2023   Procedure: BRONCHIAL BRUSHINGS;  Surgeon: Josephine Igo, DO;  Location: [facility];  Service: Pulmonary;;   BRONCHIAL NEEDLE ASPIRATION BIOPSY  04/09/2023   Procedure: BRONCHIAL NEEDLE ASPIRATION BIOPSIES;  Surgeon: Josephine Igo, DO;  Location: [facility];  Service: Pulmonary;;   BRONCHIAL WASHINGS  01/24/2022   Procedure:  BRONCHIAL WASHINGS;  Surgeon: Lupita Leash, MD;  Location: [facility];  Service: Cardiopulmonary;;   CATARACT EXTRACTION W/ INTRAOCULAR LENS  IMPLANT, BILATERAL     CHOLECYSTECTOMY N/A 08/12/2021   Procedure: LAPAROSCOPIC CHOLECYSTECTOMY;  Surgeon: Quentin Ore, MD;  Location: [facility] ORS;  Service: General;  Laterality: N/A;   COLONOSCOPY     COLONOSCOPY WITH PROPOFOL N/A 08/20/2019   Procedure: COLONOSCOPY WITH PROPOFOL;  Surgeon: Bernette Redbird, MD;  Location: [facility] ENDOSCOPY;  Service: Endoscopy;  Laterality: N/A;   CORONARY ULTRASOUND/IVUS Left 05/26/2019   Procedure: Intravascular Ultrasound/IVUS;  Surgeon: Maeola Harman, MD;  Location: [facility] INVASIVE CV LAB;  Service: Cardiovascular;  Laterality: Left;   CYSTOSCOPY     DILATION AND CURETTAGE OF UTERUS     ECTROPION REPAIR Bilateral 05/12/2021   Procedure: REPAIR OF ECTROPION BY LATERAL TARSAL STRIP OF BILATERAL LOWER EYE LIDS;  Surgeon: Dairl Ponder, MD;  Location: [facility] OR;  Service: Ophthalmology;  Laterality: Bilateral;   ESOPHAGOGASTRODUODENOSCOPY (EGD) WITH PROPOFOL N/A 08/20/2019   Procedure: ESOPHAGOGASTRODUODENOSCOPY (EGD) WITH PROPOFOL;  Surgeon: Bernette Redbird, MD;  Location: [facility] ENDOSCOPY;  Service: Endoscopy;  Laterality: N/A;   FIDUCIAL MARKER PLACEMENT  04/09/2023   Procedure: FIDUCIAL MARKER PLACEMENT;  Surgeon: Josephine Igo, DO;  Location: [facility];  Service: Pulmonary;;   HEMOSTASIS CONTROL  08/20/2019   Procedure: HEMOSTASIS CONTROL;  Surgeon: Bernette Redbird, MD;  Location: [facility] ENDOSCOPY;  Service: Endoscopy;;   HEMOSTASIS CONTROL  04/09/2023   Procedure: HEMOSTASIS CONTROL;  Surgeon: Josephine Igo, DO;  Location: [facility]OS[facility];  Service: Pulmonary;;   IVC FILTER REMOVAL N/A 03/24/2019   Procedure: IVC FILTER REMOVAL;  Surgeon: Maeola Harman, MD;  Location: [facility] INVASIVE CV LAB;  Service: Cardiovascular;  Laterality: N/A;   LACRIMAL TUBE INSERTION Right 05/12/2021   Procedure: RIGHT  NASOLACRIMAL DUCT PROBE WITH MINI MONOKA STENT PLACEMENT;  Surgeon: Dairl Ponder, MD;  Location: [facility] OR;  Service: Ophthalmology;  Laterality: Right;   LESION EXCISION WITH COMPLEX REPAIR N/A 05/12/2021   Procedure: NASAL LESION EXCISION;  Surgeon: Dairl Ponder, MD;  Location: [facility] OR;  Service: Ophthalmology;  Laterality: N/A;   LOWER EXTREMITY VENOGRAPHY Left 05/26/2019   Procedure: LOWER EXTREMITY VENOGRAPHY;  Surgeon: Maeola Harman, MD;  Location: [facility] INVASIVE CV LAB;  Service: Cardiovascular;  Laterality: Left;   MULTIPLE TOOTH EXTRACTIONS     NASAL SINUS SURGERY     PERIPHERAL VASCULAR INTERVENTION Left 05/26/2019   Procedure: PERIPHERAL VASCULAR INTERVENTION;  Surgeon: Maeola Harman, MD;  Location: [facility] INVASIVE CV LAB;  Service: Cardiovascular;  Laterality: Left;  lower extremity veins   PERIPHERAL VASCULAR THROMBECTOMY  05/26/2019   Procedure: PERIPHERAL VASCULAR THROMBECTOMY;  Surgeon: Maeola Harman, MD;  Location: [facility] INVASIVE CV LAB;  Service: Cardiovascular;;   PTOSIS REPAIR Bilateral 05/12/2021   Procedure: EXTERNAL PTOSIS REPAIR LEVATOR ADVANCEDMENT/RESECTION OF BILATERAL UPPER EYE LIDS;  Surgeon: Dairl Ponder, MD;  Location: [facility] OR;  Service: Ophthalmology;  Laterality:  Bilateral;   TUBAL LIGATION     VENA CAVA FILTER PLACEMENT N/A 01/13/2019   Procedure: INSERTION VENA-CAVA FILTER;  Surgeon: Maeola Harman, MD;  Location: [facility] OR;  Service: Vascular;  Laterality: N/A;   VIDEO BRONCHOSCOPY N/A 01/24/2022   Procedure: VIDEO BRONCHOSCOPY WITHOUT FLUORO;  Surgeon: Lupita Leash, MD;  Location: [facility] ENDOSCOPY;  Service: Cardiopulmonary;  Laterality: N/A;    Social History   Socioeconomic History   Marital status: Single    Spouse name: Not on file   Number of children: Not on file   Years of education: Not on file   Highest education level: Not on file  Occupational History   Occupation: Disabled  Tobacco Use   Smoking  status: Former    Current packs/day: 0.00    Average packs/day: 0.3 packs/day for 40.0 years (10.0 ttl pk-yrs)    Types: Cigarettes    Start date: 49    Quit date: 2000    Years since quitting: 24.9   Smokeless tobacco: Never  Vaping Use   Vaping status: Never Used  Substance and Sexual Activity   Alcohol use: No   Drug use: No   Sexual activity: Not on file  Other Topics Concern   Not on file  Social History Narrative   Not on file   Social Determinants of Health   Financial Resource Strain: Not on file  Food Insecurity: No Food Insecurity (05/02/2023)   Hunger Vital Sign    Worried About Running Out of Food in the Last Year: Never true    Ran Out of Food in the Last Year: Never true  Transportation Needs: No Transportation Needs (05/02/2023)   PRAPARE - Administrator, Civil Service (Medical): No    Lack of Transportation (Non-Medical): No  Physical Activity: Not on file  Stress: Not on file  Social Connections: Unknown (10/11/2021)   [facility]   Social Network    Social Network: Not on file  Intimate Partner Violence: Not At Risk (05/02/2023)   Humiliation, Afraid, Rape, and Kick questionnaire    Fear of Current or Ex-Partner: No    Emotionally Abused: No    Physically Abused: No    Sexually Abused: No     Allergies  Allergen Reactions   Pneumococcal Vaccines Shortness Of Breath and Other (See Comments)    Couldn't breathe    Clarithromycin Swelling   Estradiol Rash    Reaction to cream   Sulfa Antibiotics Nausea And Vomiting and Swelling   Doxycycline Hyclate Other (See Comments)    stomach distress   Pneumococcal Polysaccharide Vaccine Other (See Comments)    Unknown   Sulfacetamide Sodium-Sulfur Other (See Comments)   Sulfonamide Derivatives Swelling     Outpatient Medications Prior to Visit  Medication Sig Dispense Refill   acetaminophen (TYLENOL) 500 MG tablet Take 500 mg by mouth every 6 (six) hours as  needed for headache (pain).     albuterol (PROVENTIL) (2.5 MG/3ML) 0.083% nebulizer solution Take 3 mLs (2.5 mg total) by nebulization every 6 (six) hours as needed for wheezing or shortness of breath. 75 mL 12   albuterol (VENTOLIN HFA) 108 (90 Base) MCG/ACT inhaler Inhale 1-2 puffs into the lungs every 6 (six) hours as needed for wheezing or shortness of breath.     apixaban (ELIQUIS) 5 MG TABS tablet Take 5 mg by mouth 2 (two) times daily.     Budeson-Glycopyrrol-Formoterol (BREZTRI AEROSPHERE) 160-9-4.8 MCG/ACT AERO Inhale 2 puffs  into the lungs in the morning and at bedtime. 10.7 g 2   Budeson-Glycopyrrol-Formoterol (BREZTRI AEROSPHERE) 160-9-4.8 MCG/ACT AERO Inhale 2 puffs into the lungs in the morning and at bedtime.     cefdinir (OMNICEF) 300 MG capsule Take 1 capsule (300 mg total) by mouth 2 (two) times daily. (Patient not taking: Reported on 04/05/2023) 14 capsule 0   cetirizine (ZYRTEC) 10 MG tablet Take 10 mg by mouth daily.     escitalopram (LEXAPRO) 20 MG tablet Take 20 mg by mouth daily.     fluticasone (FLONASE) 50 MCG/ACT nasal spray Place 2 sprays into both nostrils daily. (Patient not taking: Reported on 04/05/2023) 9.9 mL 0   furosemide (LASIX) 20 MG tablet Take 20 mg by mouth daily as needed for fluid or edema. Takes when having swelling. (Patient not taking: Reported on 03/12/2023)     gabapentin (NEURONTIN) 100 MG capsule Take 100 mg by mouth at bedtime. (Patient not taking: Reported on 05/02/2023)     LORazepam (ATIVAN) 1 MG tablet Take 1 mg by mouth at bedtime.     omeprazole (PRILOSEC) 20 MG capsule Take 20 mg by mouth 2 (two) times daily before a meal.     predniSONE (DELTASONE) 10 MG tablet 4 tabs for 2 days, then 3 tabs for 2 days, 2 tabs for 2 days, then 1 tab for 2 days, then stop (Patient not taking: Reported on 04/05/2023) 20 tablet 0   traZODone (DESYREL) 100 MG tablet Take 100 mg by mouth at bedtime.     triamterene-hydrochlorothiazide (MAXZIDE-25) 37.5-25 MG tablet  Take 0.5 tablets by mouth daily.     No facility-administered medications prior to visit.    Review of Systems  Constitutional:  Negative for chills, fever, malaise/fatigue and weight loss.  HENT:  Negative for hearing loss, sore throat and tinnitus.   Eyes:  Negative for blurred vision and double vision.  Respiratory:  Positive for cough and sputum production. Negative for hemoptysis, shortness of breath, wheezing and stridor.   Cardiovascular:  Negative for chest pain, palpitations, orthopnea, leg swelling and PND.  Gastrointestinal:  Negative for abdominal pain, constipation, diarrhea, heartburn, nausea and vomiting.  Genitourinary:  Negative for dysuria, hematuria and urgency.  Musculoskeletal:  Negative for joint pain and myalgias.  Skin:  Negative for itching and rash.  Neurological:  Negative for dizziness, tingling, weakness and headaches.  Endo/Heme/Allergies:  Negative for environmental allergies. Does not bruise/bleed easily.  Psychiatric/Behavioral:  Negative for depression. The patient is not nervous/anxious and does not have insomnia.   All other systems reviewed and are negative.    Objective:  Physical Exam Vitals reviewed.  Constitutional:      General: She is not in acute distress.    Appearance: She is well-developed.  HENT:     Head: Normocephalic and atraumatic.  Eyes:     General: No scleral icterus.    Conjunctiva/sclera: Conjunctivae normal.     Pupils: Pupils are equal, round, and reactive to light.  Neck:     Vascular: No JVD.     Trachea: No tracheal deviation.  Cardiovascular:     Rate and Rhythm: Normal rate and regular rhythm.     Heart sounds: Normal heart sounds. No murmur heard. Pulmonary:     Effort: Pulmonary effort is normal. No tachypnea, accessory muscle usage or respiratory distress.     Breath sounds: No stridor. Wheezing present. No rhonchi or rales.     Comments: Right upper lobe wheeze Severely diminished breath sounds  bilaterally Abdominal:     General: There is no distension.     Palpations: Abdomen is soft.     Tenderness: There is no abdominal tenderness.  Musculoskeletal:        General: No tenderness.     Cervical back: Neck supple.  Lymphadenopathy:     Cervical: No cervical adenopathy.  Skin:    General: Skin is warm and dry.     Capillary Refill: Capillary refill takes less than 2 seconds.     Findings: No rash.  Neurological:     Mental Status: She is alert and oriented to person, place, and time.  Psychiatric:        Behavior: Behavior normal.      Vitals:   05/07/23 0938  BP: 122/62  Pulse: 88  SpO2: 93%  Weight: 164 lb (74.4 kg)  Height: 5\' 6"  (1.676 m)    93% on RA BMI Readings from Last 3 Encounters:  05/07/23 26.47 kg/m  05/02/23 27.28 kg/m  04/09/23 27.44 kg/m   Wt Readings from Last 3 Encounters:  05/07/23 164 lb (74.4 kg)  05/02/23 169 lb (76.7 kg)  04/09/23 170 lb (77.1 kg)     CBC    Component Value Date/Time   WBC 5.8 04/09/2023 0915   RBC 4.83 04/09/2023 0915   HGB 11.5 (L) 04/09/2023 0915   HCT 40.5 04/09/2023 0915   PLT 328 04/09/2023 0915   MCV 83.9 04/09/2023 0915   MCH 23.8 (L) 04/09/2023 0915   MCHC 28.4 (L) 04/09/2023 0915   RDW 20.2 (H) 04/09/2023 0915   LYMPHSABS 0.8 09/25/2022 2024   MONOABS 0.4 09/25/2022 2024   EOSABS 0.0 09/25/2022 2024   BASOSABS 0.0 09/25/2022 2024    Chest Imaging:  Chest x-ray 08/22/2022: No active pulmonary disease no infiltrate. The patient's images have been independently reviewed by me.    Pulmonary Functions Testing Results:    Latest Ref Rng & Units 11/15/2022    8:23 AM  PFT Results  FVC-Pre L 1.72   FVC-Predicted Pre % 59   FVC-Post L 2.01   FVC-Predicted Post % 69   Pre FEV1/FVC % % 38   Post FEV1/FCV % % 36   FEV1-Pre L 0.65   FEV1-Predicted Pre % 29   FEV1-Post L 0.72   DLCO uncorrected ml/min/mmHg 7.38   DLCO UNC% % 36   DLCO corrected ml/min/mmHg 7.31   DLCO COR %Predicted % 36    DLVA Predicted % 49   TLC L 6.16   TLC % Predicted % 115   RV % Predicted % 171     FeNO:   Pathology:   Echocardiogram:   Heart Catheterization:     Assessment & Plan:     ICD-10-CM   1. COPD, severe (HCC)  J44.9     2. Chronic hypoxemic respiratory failure (HCC)  J96.11     3. History of pulmonary embolism  Z86.711         Discussion:  This is an 82 year old female, new diagnosis of stage I lung cancer planning for radiation treatments has severe COPD and oxygen dependent respiratory failure.  At baseline FEV1 is 0.75 L.  Plan: She needs to maintain on anticoagulation. When I work with pharmacy to try to figure out how to help her with cost of apixaban. We talked about her need to stay active and continue to do the exercises from pulmonary rehab. Continue triple therapy inhaler regimen. We could always consider adding azithromycin Monday Wednesday Friday  in the future. Will give her samples today of Breztri if we have them to help with inhaler cost.  Return to clinic in 3 months or as needed.    Current Outpatient Medications:    acetaminophen (TYLENOL) 500 MG tablet, Take 500 mg by mouth every 6 (six) hours as needed for headache (pain)., Disp: , Rfl:    albuterol (PROVENTIL) (2.5 MG/3ML) 0.083% nebulizer solution, Take 3 mLs (2.5 mg total) by nebulization every 6 (six) hours as needed for wheezing or shortness of breath., Disp: 75 mL, Rfl: 12   albuterol (VENTOLIN HFA) 108 (90 Base) MCG/ACT inhaler, Inhale 1-2 puffs into the lungs every 6 (six) hours as needed for wheezing or shortness of breath., Disp: , Rfl:    apixaban (ELIQUIS) 5 MG TABS tablet, Take 5 mg by mouth 2 (two) times daily., Disp: , Rfl:    Budeson-Glycopyrrol-Formoterol (BREZTRI AEROSPHERE) 160-9-4.8 MCG/ACT AERO, Inhale 2 puffs into the lungs in the morning and at bedtime., Disp: 10.7 g, Rfl: 2   Budeson-Glycopyrrol-Formoterol (BREZTRI AEROSPHERE) 160-9-4.8 MCG/ACT AERO, Inhale 2 puffs into the  lungs in the morning and at bedtime., Disp: , Rfl:    cefdinir (OMNICEF) 300 MG capsule, Take 1 capsule (300 mg total) by mouth 2 (two) times daily. (Patient not taking: Reported on 04/05/2023), Disp: 14 capsule, Rfl: 0   cetirizine (ZYRTEC) 10 MG tablet, Take 10 mg by mouth daily., Disp: , Rfl:    escitalopram (LEXAPRO) 20 MG tablet, Take 20 mg by mouth daily., Disp: , Rfl:    fluticasone (FLONASE) 50 MCG/ACT nasal spray, Place 2 sprays into both nostrils daily. (Patient not taking: Reported on 04/05/2023), Disp: 9.9 mL, Rfl: 0   furosemide (LASIX) 20 MG tablet, Take 20 mg by mouth daily as needed for fluid or edema. Takes when having swelling. (Patient not taking: Reported on 03/12/2023), Disp: , Rfl:    gabapentin (NEURONTIN) 100 MG capsule, Take 100 mg by mouth at bedtime. (Patient not taking: Reported on 05/02/2023), Disp: , Rfl:    LORazepam (ATIVAN) 1 MG tablet, Take 1 mg by mouth at bedtime., Disp: , Rfl:    omeprazole (PRILOSEC) 20 MG capsule, Take 20 mg by mouth 2 (two) times daily before a meal., Disp: , Rfl:    predniSONE (DELTASONE) 10 MG tablet, 4 tabs for 2 days, then 3 tabs for 2 days, 2 tabs for 2 days, then 1 tab for 2 days, then stop (Patient not taking: Reported on 04/05/2023), Disp: 20 tablet, Rfl: 0   traZODone (DESYREL) 100 MG tablet, Take 100 mg by mouth at bedtime., Disp: , Rfl:    triamterene-hydrochlorothiazide (MAXZIDE-25) 37.5-25 MG tablet, Take 0.5 tablets by mouth daily., Disp: , Rfl:    Josephine Igo, DO [facility] 05/07/2023 9:56 AM

## 2023-05-08 ENCOUNTER — Encounter: Payer: Self-pay | Admitting: Radiation Oncology

## 2023-05-08 DIAGNOSIS — H43813 Vitreous degeneration, bilateral: Secondary | ICD-10-CM | POA: Diagnosis not present

## 2023-05-09 ENCOUNTER — Other Ambulatory Visit: Payer: Self-pay | Admitting: Radiation Oncology

## 2023-05-09 DIAGNOSIS — C3490 Malignant neoplasm of unspecified part of unspecified bronchus or lung: Secondary | ICD-10-CM

## 2023-05-10 DIAGNOSIS — C3431 Malignant neoplasm of lower lobe, right bronchus or lung: Secondary | ICD-10-CM | POA: Diagnosis not present

## 2023-05-10 DIAGNOSIS — Z87891 Personal history of nicotine dependence: Secondary | ICD-10-CM | POA: Diagnosis not present

## 2023-05-10 DIAGNOSIS — Z510 Encounter for antineoplastic radiation therapy: Secondary | ICD-10-CM | POA: Diagnosis not present

## 2023-05-11 ENCOUNTER — Encounter: Payer: Self-pay | Admitting: Pulmonary Disease

## 2023-05-12 MED ORDER — APIXABAN 5 MG PO TABS: 5.0000 mg | ORAL_TABLET | Freq: Two times a day (BID) | ORAL | 0 refills | Status: AC

## 2023-05-14 ENCOUNTER — Ambulatory Visit
Admission: RE | Admit: 2023-05-14 | Discharge: 2023-05-14 | Disposition: A | Discharge status: Home / Self Care | Payer: PPO | Source: Ambulatory Visit | Attending: Radiation Oncology | Admitting: Radiation Oncology

## 2023-05-14 DIAGNOSIS — C3490 Malignant neoplasm of unspecified part of unspecified bronchus or lung: Secondary | ICD-10-CM

## 2023-05-14 MED ORDER — GADOPICLENOL 0.5 MMOL/ML IV SOLN
7.5000 mL | Freq: Once | INTRAVENOUS | Status: AC | PRN
  Administered 2023-05-14: 7.5 mL via INTRAVENOUS

## 2023-05-15 ENCOUNTER — Ambulatory Visit
Admission: RE | Admit: 2023-05-15 | Discharge: 2023-05-15 | Disposition: A | Discharge status: Home / Self Care | Payer: PPO | Source: Ambulatory Visit | Attending: Radiation Oncology | Admitting: Radiation Oncology

## 2023-05-15 ENCOUNTER — Other Ambulatory Visit: Payer: Self-pay

## 2023-05-15 DIAGNOSIS — Z510 Encounter for antineoplastic radiation therapy: Secondary | ICD-10-CM | POA: Diagnosis not present

## 2023-05-15 DIAGNOSIS — C3431 Malignant neoplasm of lower lobe, right bronchus or lung: Secondary | ICD-10-CM

## 2023-05-15 LAB — RAD ONC ARIA SESSION SUMMARY
Course Elapsed Days: 0
Plan Fractions Treated to Date: 1
Plan Prescribed Dose Per Fraction: 18 Gy
Plan Total Fractions Prescribed: 3
Plan Total Prescribed Dose: 54 Gy
Reference Point Dosage Given to Date: 18 Gy
Reference Point Session Dosage Given: 18 Gy
Session Number: 1

## 2023-05-16 ENCOUNTER — Ambulatory Visit: Payer: PPO | Admitting: Radiation Oncology

## 2023-05-17 ENCOUNTER — Other Ambulatory Visit: Payer: Self-pay

## 2023-05-17 ENCOUNTER — Ambulatory Visit
Admission: RE | Admit: 2023-05-17 | Discharge: 2023-05-17 | Disposition: A | Discharge status: Home / Self Care | Payer: PPO | Source: Ambulatory Visit | Attending: Radiation Oncology | Admitting: Radiation Oncology

## 2023-05-17 DIAGNOSIS — Z510 Encounter for antineoplastic radiation therapy: Secondary | ICD-10-CM | POA: Diagnosis not present

## 2023-05-17 DIAGNOSIS — C3431 Malignant neoplasm of lower lobe, right bronchus or lung: Secondary | ICD-10-CM

## 2023-05-17 LAB — RAD ONC ARIA SESSION SUMMARY
Course Elapsed Days: 2
Plan Fractions Treated to Date: 2
Plan Prescribed Dose Per Fraction: 18 Gy
Plan Total Fractions Prescribed: 3
Plan Total Prescribed Dose: 54 Gy
Reference Point Dosage Given to Date: 36 Gy
Reference Point Session Dosage Given: 18 Gy
Session Number: 2

## 2023-05-21 ENCOUNTER — Ambulatory Visit
Admission: RE | Admit: 2023-05-21 | Discharge: 2023-05-21 | Disposition: A | Discharge status: Home / Self Care | Payer: PPO | Source: Ambulatory Visit | Attending: Radiation Oncology | Admitting: Radiation Oncology

## 2023-05-21 ENCOUNTER — Other Ambulatory Visit: Payer: Self-pay

## 2023-05-21 DIAGNOSIS — Z87891 Personal history of nicotine dependence: Secondary | ICD-10-CM | POA: Diagnosis not present

## 2023-05-21 DIAGNOSIS — C3431 Malignant neoplasm of lower lobe, right bronchus or lung: Secondary | ICD-10-CM | POA: Diagnosis not present

## 2023-05-21 DIAGNOSIS — Z510 Encounter for antineoplastic radiation therapy: Secondary | ICD-10-CM | POA: Diagnosis not present

## 2023-05-21 LAB — RAD ONC ARIA SESSION SUMMARY
Course Elapsed Days: 6
Plan Fractions Treated to Date: 3
Plan Prescribed Dose Per Fraction: 18 Gy
Plan Total Fractions Prescribed: 3
Plan Total Prescribed Dose: 54 Gy
Reference Point Dosage Given to Date: 54 Gy
Reference Point Session Dosage Given: 18 Gy
Session Number: 3

## 2023-05-22 ENCOUNTER — Ambulatory Visit: Payer: PPO | Admitting: Radiation Oncology

## 2023-05-22 ENCOUNTER — Ambulatory Visit: Payer: PPO

## 2023-05-22 NOTE — Radiation Completion Notes (Signed)
Patient Name: [name], [name] MRN: [id number] Date of Birth: 07-02-1940 Referring Physician: Audie Box, M.D. Date of Service: 2023-05-22 Radiation Oncologist: Arnette Schaumann, M.D. [facility]                             RADIATION ONCOLOGY END OF TREATMENT NOTE     Diagnosis: C34.31 Malignant neoplasm of lower lobe, right bronchus or lung Intent: Curative     ==========DELIVERED PLANS==========  First Treatment Date: 2023-05-15 Last Treatment Date: 2023-05-21   Plan Name: Lung_R_SBRT Site: Lung, Right Technique: SBRT/SRT-IMRT Mode: Photon Dose Per Fraction: 18 Gy Prescribed Dose (Delivered / Prescribed): 54 Gy / 54 Gy Prescribed Fxs (Delivered / Prescribed): 3 / 3     ==========ON TREATMENT VISIT DATES========== 2023-05-15, 2023-05-17, 2023-05-17, 2023-05-21, 2023-05-21     ==========UPCOMING VISITS==========       ==========APPENDIX - ON TREATMENT VISIT NOTES==========   See weekly On Treatment Notes in Epic for details in the Media tab (listed as Progress notes on the On Treatment Visit Dates listed above).

## 2023-05-28 ENCOUNTER — Other Ambulatory Visit (HOSPITAL_BASED_OUTPATIENT_CLINIC_OR_DEPARTMENT_OTHER): Payer: Self-pay

## 2023-05-28 ENCOUNTER — Emergency Department (HOSPITAL_BASED_OUTPATIENT_CLINIC_OR_DEPARTMENT_OTHER)
Admission: EM | Admit: 2023-05-28 | Discharge: 2023-05-28 | Discharge status: Against Medical Advice | Payer: PPO | Attending: Emergency Medicine | Admitting: Emergency Medicine

## 2023-05-28 ENCOUNTER — Encounter (HOSPITAL_BASED_OUTPATIENT_CLINIC_OR_DEPARTMENT_OTHER): Payer: Self-pay | Admitting: Emergency Medicine

## 2023-05-28 ENCOUNTER — Emergency Department (HOSPITAL_BASED_OUTPATIENT_CLINIC_OR_DEPARTMENT_OTHER): Payer: PPO

## 2023-05-28 ENCOUNTER — Other Ambulatory Visit: Payer: Self-pay

## 2023-05-28 DIAGNOSIS — Z7951 Long term (current) use of inhaled steroids: Secondary | ICD-10-CM | POA: Insufficient documentation

## 2023-05-28 DIAGNOSIS — J441 Chronic obstructive pulmonary disease with (acute) exacerbation: Secondary | ICD-10-CM

## 2023-05-28 DIAGNOSIS — Z7901 Long term (current) use of anticoagulants: Secondary | ICD-10-CM | POA: Diagnosis not present

## 2023-05-28 DIAGNOSIS — R062 Wheezing: Secondary | ICD-10-CM | POA: Insufficient documentation

## 2023-05-28 DIAGNOSIS — J449 Chronic obstructive pulmonary disease, unspecified: Secondary | ICD-10-CM | POA: Diagnosis not present

## 2023-05-28 DIAGNOSIS — R059 Cough, unspecified: Secondary | ICD-10-CM | POA: Diagnosis not present

## 2023-05-28 DIAGNOSIS — Z85118 Personal history of other malignant neoplasm of bronchus and lung: Secondary | ICD-10-CM | POA: Insufficient documentation

## 2023-05-28 DIAGNOSIS — J439 Emphysema, unspecified: Secondary | ICD-10-CM | POA: Diagnosis not present

## 2023-05-28 DIAGNOSIS — Z5329 Procedure and treatment not carried out because of patient's decision for other reasons: Secondary | ICD-10-CM | POA: Insufficient documentation

## 2023-05-28 DIAGNOSIS — Z20822 Contact with and (suspected) exposure to covid-19: Secondary | ICD-10-CM | POA: Insufficient documentation

## 2023-05-28 DIAGNOSIS — R0602 Shortness of breath: Secondary | ICD-10-CM | POA: Diagnosis not present

## 2023-05-28 DIAGNOSIS — C3490 Malignant neoplasm of unspecified part of unspecified bronchus or lung: Secondary | ICD-10-CM | POA: Diagnosis not present

## 2023-05-28 DIAGNOSIS — D72829 Elevated white blood cell count, unspecified: Secondary | ICD-10-CM | POA: Diagnosis not present

## 2023-05-28 DIAGNOSIS — R300 Dysuria: Secondary | ICD-10-CM | POA: Diagnosis not present

## 2023-05-28 LAB — BASIC METABOLIC PANEL
Anion gap: 8 (ref 5–15)
BUN: 18 mg/dL (ref 8–23)
CO2: 32 mmol/L (ref 22–32)
Calcium: 9.2 mg/dL (ref 8.9–10.3)
Chloride: 97 mmol/L — ABNORMAL LOW (ref 98–111)
Creatinine, Ser: 0.87 mg/dL (ref 0.44–1.00)
GFR, Estimated: >60 mL/min (ref >60)
Glucose, Bld: 104 mg/dL — ABNORMAL HIGH (ref 70–99)
Potassium: 3.7 mmol/L (ref 3.5–5.1)
Sodium: 137 mmol/L (ref 135–145)

## 2023-05-28 LAB — CBC WITH DIFFERENTIAL/PLATELET
Abs Immature Granulocytes: 0.04 10*3/uL (ref 0.00–0.07)
Basophils Absolute: 0.1 10*3/uL (ref 0.0–0.1)
Basophils Relative: 0 %
Eosinophils Absolute: 0.1 10*3/uL (ref 0.0–0.5)
Eosinophils Relative: 0 %
HCT: 40.5 % (ref 36.0–46.0)
Hemoglobin: 12 g/dL (ref 12.0–15.0)
Immature Granulocytes: 0 %
Lymphocytes Relative: 6 %
Lymphs Abs: 0.7 10*3/uL (ref 0.7–4.0)
MCH: 25.3 pg — ABNORMAL LOW (ref 26.0–34.0)
MCHC: 29.6 g/dL — ABNORMAL LOW (ref 30.0–36.0)
MCV: 85.4 fL (ref 80.0–100.0)
Monocytes Absolute: 1 10*3/uL (ref 0.1–1.0)
Monocytes Relative: 8 %
Neutro Abs: 10.2 10*3/uL — ABNORMAL HIGH (ref 1.7–7.7)
Neutrophils Relative %: 86 %
Platelets: 386 10*3/uL (ref 150–400)
RBC: 4.74 MIL/uL (ref 3.87–5.11)
RDW: 17.8 % — ABNORMAL HIGH (ref 11.5–15.5)
WBC: 12 10*3/uL — ABNORMAL HIGH (ref 4.0–10.5)
nRBC: 0 % (ref 0.0–0.2)

## 2023-05-28 LAB — LACTIC ACID, PLASMA: Lactic Acid, Venous: 0.9 mmol/L (ref 0.5–1.9)

## 2023-05-28 LAB — RESP PANEL BY RT-PCR (RSV, FLU A&B, COVID)  RVPGX2
Influenza A by PCR: NEGATIVE
Influenza B by PCR: NEGATIVE
Resp Syncytial Virus by PCR: NEGATIVE
SARS Coronavirus 2 by RT PCR: NEGATIVE

## 2023-05-28 LAB — BRAIN NATRIURETIC PEPTIDE: B Natriuretic Peptide: 43.1 pg/mL (ref 0.0–100.0)

## 2023-05-28 LAB — TROPONIN I (HIGH SENSITIVITY): Troponin I (High Sensitivity): 7 ng/L (ref <18)

## 2023-05-28 MED ORDER — AZITHROMYCIN 250 MG PO TABS
250.0000 mg | ORAL_TABLET | Freq: Every day | ORAL | 0 refills | Status: AC
  Filled 2023-05-28: qty 4, 4d supply, fill #0

## 2023-05-28 MED ORDER — ALBUTEROL SULFATE (2.5 MG/3ML) 0.083% IN NEBU
5.0000 mg | INHALATION_SOLUTION | Freq: Once | RESPIRATORY_TRACT | Status: AC
  Administered 2023-05-28: 5 mg via RESPIRATORY_TRACT
  Filled 2023-05-28: qty 6

## 2023-05-28 MED ORDER — IPRATROPIUM-ALBUTEROL 0.5-2.5 (3) MG/3ML IN SOLN
3.0000 mL | Freq: Once | RESPIRATORY_TRACT | Status: AC
  Administered 2023-05-28: 3 mL via RESPIRATORY_TRACT
  Filled 2023-05-28: qty 3

## 2023-05-28 MED ORDER — PREDNISONE 20 MG PO TABS
40.0000 mg | ORAL_TABLET | Freq: Every day | ORAL | 0 refills | Status: AC
Start: 2023-05-28 — End: 2023-06-01
  Filled 2023-05-28: qty 8, 4d supply, fill #0

## 2023-05-28 MED ORDER — AZITHROMYCIN 500 MG IV SOLR
500.0000 mg | Freq: Once | INTRAVENOUS | Status: AC
  Administered 2023-05-28: 500 mg via INTRAVENOUS
  Filled 2023-05-28: qty 5

## 2023-05-28 MED ORDER — METHYLPREDNISOLONE SODIUM SUCC 125 MG IJ SOLR
125.0000 mg | Freq: Once | INTRAMUSCULAR | Status: AC
  Administered 2023-05-28: 125 mg via INTRAVENOUS
  Filled 2023-05-28: qty 2

## 2023-05-28 MED ORDER — IPRATROPIUM-ALBUTEROL 0.5-2.5 (3) MG/3ML IN SOLN
3.0000 mL | RESPIRATORY_TRACT | Status: AC
  Administered 2023-05-28 (×3): 3 mL via RESPIRATORY_TRACT
  Filled 2023-05-28: qty 9

## 2023-05-28 MED ORDER — CEFPODOXIME PROXETIL 100 MG PO TABS
100.0000 mg | ORAL_TABLET | Freq: Two times a day (BID) | ORAL | 0 refills | Status: AC
  Filled 2023-05-28: qty 14, 7d supply, fill #0

## 2023-05-28 MED ORDER — MAGNESIUM SULFATE 2 GM/50ML IV SOLN
2.0000 g | Freq: Once | INTRAVENOUS | Status: AC
  Administered 2023-05-28: 2 g via INTRAVENOUS
  Filled 2023-05-28: qty 50

## 2023-05-28 MED ORDER — SODIUM CHLORIDE 0.9 % IV SOLN
2.0000 g | Freq: Once | INTRAVENOUS | Status: AC
  Administered 2023-05-28: 2 g via INTRAVENOUS
  Filled 2023-05-28: qty 20

## 2023-05-28 NOTE — ED Notes (Signed)
Blood cultures x2 collected before antibiotics began

## 2023-05-28 NOTE — ED Triage Notes (Signed)
Pt via pov from UC with sob; was told she had UTI. Pt is chronically on 2-3 L O2. (COPD). Pt had radiation last week (lung cancer). Pt O2 sat 81% at triage. Pt alert & oriented, sob.

## 2023-05-28 NOTE — ED Notes (Signed)
Blood Cultures x2 drawn before starting antibiotics.

## 2023-05-28 NOTE — ED Notes (Signed)
Reviewed discharge instructions, medications, and home care with pt. Pt verbalized understanding and had no further questions. Pt exited ED via wheelchair without complications.

## 2023-05-28 NOTE — ED Notes (Signed)
Pt wanting to leave AMA and signed AMA form. MD informed pt of risks of not being admitted to hospital. This RN encouraged pt to return to ED if needed or symptoms worsen. NAD noted.

## 2023-05-28 NOTE — ED Notes (Addendum)
ED Provider at bedside. 

## 2023-05-28 NOTE — Discharge Instructions (Addendum)
You were requiring more oxygen than he normally do.  I think he would benefit from coming into the hospital with this.  You have elected to go home.  Please return for any worsening breathing or if you change your mind and would like to stay.  I prescribed you steroids and antibiotics.  Please let your family doctor know about your visit and see when they can see you in the office.  Use your inhaler every 4 hours(6 puffs) while awake, return for sudden worsening shortness of breath, or if you need to use your inhaler more often.

## 2023-05-28 NOTE — ED Notes (Signed)
RT tried to walk the Pt on 3L [place] and the Pt just walked to the door and her SAT 85% and her HR 122. RT got the Pt to get back in the bed and placed her on 3L[place] . Laying in the bed the Pt's O2 was 91%

## 2023-05-28 NOTE — ED Provider Notes (Signed)
[place] EMERGENCY DEPARTMENT AT [facility] Provider Note   CSN: [id number] Arrival date & time: 05/28/23  1301     History  Chief Complaint  Patient presents with   Shortness of Breath    [name] is a 82 y.o. female.  89 yoF with a chief complaint of cough congestion shortness of breath.  This been going on for a couple weeks.  Progressively worsened today went to urgent care.  Was given reportedly had neb and got an x-ray and had a COVID test.  She was then told to come here for further evaluation.  She said she has been coughing up but it more junk than normal and that it is changed color.   Shortness of Breath      Home Medications Prior to Admission medications   Medication Sig Start Date End Date Taking? Authorizing Provider  azithromycin (ZITHROMAX) 250 MG tablet Take 1 tablet (250 mg total) by mouth daily for 4 days. Take first 2 tablets together, then 1 every day until finished. 05/28/23 06/01/23 Yes Melene Plan, DO  cefpodoxime (VANTIN) 100 MG tablet Take 1 tablet (100 mg total) by mouth 2 (two) times daily for 7 days. 05/28/23 06/04/23 Yes Melene Plan, DO  predniSONE (DELTASONE) 20 MG tablet 2 tabs po daily x 4 days 05/28/23  Yes Melene Plan, DO  acetaminophen (TYLENOL) 500 MG tablet Take 500 mg by mouth every 6 (six) hours as needed for headache (pain).    [provider]  albuterol (PROVENTIL) (2.5 MG/3ML) 0.083% nebulizer solution Take 3 mLs (2.5 mg total) by nebulization every 6 (six) hours as needed for wheezing or shortness of breath. 02/28/22   Lupita Leash, MD  albuterol (VENTOLIN HFA) 108 (90 Base) MCG/ACT inhaler Inhale 1-2 puffs into the lungs every 6 (six) hours as needed for wheezing or shortness of breath.    [provider]  apixaban (ELIQUIS) 5 MG TABS tablet Take 1 tablet (5 mg total) by mouth 2 (two) times daily. 05/12/23   Icard, Rachel Bo, DO  Budeson-Glycopyrrol-Formoterol (BREZTRI AEROSPHERE) 160-9-4.8 MCG/ACT AERO  Inhale 2 puffs into the lungs in the morning and at bedtime. 06/21/22   Lupita Leash, MD  Budeson-Glycopyrrol-Formoterol (BREZTRI AEROSPHERE) 160-9-4.8 MCG/ACT AERO Inhale 2 puffs into the lungs in the morning and at bedtime. 03/12/23   Parrett, Virgel Bouquet, NP  cefdinir (OMNICEF) 300 MG capsule Take 1 capsule (300 mg total) by mouth 2 (two) times daily. Patient not taking: Reported on 04/05/2023 03/12/23   Parrett, Virgel Bouquet, NP  cetirizine (ZYRTEC) 10 MG tablet Take 10 mg by mouth daily.    [provider]  escitalopram (LEXAPRO) 20 MG tablet Take 20 mg by mouth daily.    [provider]  fluticasone (FLONASE) 50 MCG/ACT nasal spray Place 2 sprays into both nostrils daily. Patient not taking: Reported on 04/05/2023 09/29/22   Glade Lloyd, MD  furosemide (LASIX) 20 MG tablet Take 20 mg by mouth daily as needed for fluid or edema. Takes when having swelling. Patient not taking: Reported on 03/12/2023    [provider]  gabapentin (NEURONTIN) 100 MG capsule Take 100 mg by mouth at bedtime. Patient not taking: Reported on 05/02/2023 10/05/22   [provider]  LORazepam (ATIVAN) 1 MG tablet Take 1 mg by mouth at bedtime.    [provider]  omeprazole (PRILOSEC) 20 MG capsule Take 20 mg by mouth 2 (two) times daily before a meal. 10/19/22   [provider]  traZODone (DESYREL) 100 MG tablet Take 100 mg by mouth at bedtime.    [provider]  triamterene-hydrochlorothiazide (MAXZIDE-25) 37.5-25 MG tablet Take 0.5 tablets by mouth daily. 02/06/19   [provider]      Allergies    Pneumococcal vaccines, Clarithromycin, Estradiol, Sulfa antibiotics, Doxycycline hyclate, Pneumococcal polysaccharide vaccine, Sulfacetamide sodium-sulfur, and Sulfonamide derivatives    Review of Systems   Review of Systems  Respiratory:  Positive for shortness of breath.     Physical Exam Updated Vital Signs BP 116/66   Pulse (!) 122   Resp 16    Ht 5\' 6"  (1.676 m)   Wt 74.4 kg   SpO2 92%   BMI 26.47 kg/m  Physical Exam Vitals and nursing note reviewed.  Constitutional:      General: She is not in acute distress.    Appearance: She is well-developed. She is not diaphoretic.  HENT:     Head: Normocephalic and atraumatic.  Eyes:     Pupils: Pupils are equal, round, and reactive to light.  Cardiovascular:     Rate and Rhythm: Normal rate and regular rhythm.     Heart sounds: No murmur heard.    No friction rub. No gallop.  Pulmonary:     Effort: Pulmonary effort is normal.     Breath sounds: Decreased breath sounds and wheezing present. No rales.     Comments: Diminished breath sounds in all fields with prolonged expiratory effort Abdominal:     General: There is no distension.     Palpations: Abdomen is soft.     Tenderness: There is no abdominal tenderness.  Musculoskeletal:        General: No tenderness.     Cervical back: Normal range of motion and neck supple.  Skin:    General: Skin is warm and dry.  Neurological:     Mental Status: She is alert and oriented to person, place, and time.  Psychiatric:        Behavior: Behavior normal.     ED Results / Procedures / Treatments   Labs (all labs ordered are listed, but only abnormal results are displayed) Labs Reviewed  CBC WITH DIFFERENTIAL/PLATELET - Abnormal; Notable for the following components:      Result Value   WBC 12.0 (*)    MCH 25.3 (*)    MCHC 29.6 (*)    RDW 17.8 (*)    Neutro Abs 10.2 (*)    All other components within normal limits  BASIC METABOLIC PANEL - Abnormal; Notable for the following components:   Chloride 97 (*)    Glucose, Bld 104 (*)    All other components within normal limits  RESP PANEL BY RT-PCR (RSV, FLU A&B, COVID)  [id number]  CULTURE, BLOOD (ROUTINE X 2)  CULTURE, BLOOD (ROUTINE X 2)  BRAIN NATRIURETIC PEPTIDE  LACTIC ACID, PLASMA  LACTIC ACID, PLASMA  TROPONIN I (HIGH SENSITIVITY)    EKG EKG  Interpretation Date/Time:  Monday May 28 2023 13:17:25 EST Ventricular Rate:  104 PR Interval:  163 QRS Duration:  75 QT Interval:  360 QTC Calculation: 474 R Axis:   45  Text Interpretation: Sinus tachycardia Ventricular premature complex Low voltage, extremity leads Otherwise no significant change Confirmed by Melene Plan [phone]) on 05/28/2023 1:23:46 PM  Radiology No results found.  Procedures .Critical Care  Performed by: Melene Plan, DO Authorized by: Melene Plan, DO   Critical care provider statement:    Critical care time (minutes):  35  Critical care time was exclusive of:  Separately billable procedures and treating other patients   Critical care was time spent personally by me on the following activities:  Development of treatment plan with patient or surrogate, discussions with consultants, evaluation of patient's response to treatment, examination of patient, ordering and review of laboratory studies, ordering and review of radiographic studies, ordering and performing treatments and interventions, pulse oximetry, re-evaluation of patient's condition and review of old charts   Care discussed with: admitting provider       Medications Ordered in ED Medications  azithromycin (ZITHROMAX) 500 mg in sodium chloride 0.9 % 250 mL IVPB (500 mg Intravenous New Bag/Given 05/28/23 1421)  magnesium sulfate IVPB 2 g 50 mL (0 g Intravenous Stopped 05/28/23 1417)  ipratropium-albuterol (DUONEB) 0.5-2.5 (3) MG/3ML nebulizer solution 3 mL (3 mLs Nebulization Given 05/28/23 1339)  methylPREDNISolone sodium succinate (SOLU-MEDROL) 125 mg/2 mL injection 125 mg (125 mg Intravenous Given 05/28/23 1341)  cefTRIAXone (ROCEPHIN) 2 g in sodium chloride 0.9 % 100 mL IVPB (0 g Intravenous Stopped 05/28/23 1421)  albuterol (PROVENTIL) (2.5 MG/3ML) 0.083% nebulizer solution 5 mg (5 mg Nebulization Given 05/28/23 1423)  ipratropium-albuterol (DUONEB) 0.5-2.5 (3) MG/3ML nebulizer solution 3 mL (3  mLs Nebulization Given 05/28/23 1423)    ED Course/ Medical Decision Making/ A&P                                 Medical Decision Making Amount and/or Complexity of Data Reviewed Labs: ordered. Radiology: ordered.  Risk Prescription drug management.   82 yo F with a chief complaints of difficulty breathing.  She has a history of COPD as well as lung cancer.  She felt like she has been getting worse over the past couple weeks.  Has had increased sputum and change in sputum.  She is describing purulent sputum at home.  I will start on antibiotics.  COVID flu and RSV testing.  Chest x-ray.  Beta agonist therapy steroids and magnesium.  Reassess.  The patient is doing better on repeat assessment however still requiring more oxygen than her baseline.  I discussed this with the patient and she was adamant that she would not stay in the hospital.  She think she feels good enough to go.  We ambulate the patient on her baseline oxygen and she became hypoxic with just a short walk to the door.  Mild leukocytosis. Troponin negative, BNP negative.  COVID flu and RSV are negative.  Chest x-ray independently interpreted by me without focal infiltrate or pneumothorax.  I discussed my concern with the patient that this might be something that gets progressively worse and dense her life.  This neck.  She understands if it was swelling she is to go home at this time.  She agrees to return if her symptoms worse.  Will prescribe her steroids and antibiotics.  3:17 PM:  I have discussed the diagnosis/risks/treatment options with the patient.  Evaluation and diagnostic testing in the emergency department does not suggest an emergent condition requiring admission or immediate intervention beyond what has been performed at this time.  They will follow up with PCP, pulm. We also discussed returning to the ED immediately if new or worsening sx occur. We discussed the sx which are most concerning (e.g., sudden  worsening pain, fever, inability to tolerate by mouth, difficulty breathing, need to use her rescue inhaler more often than every four hours) that necessitate immediate return.  Medications administered to the patient during their visit and any new prescriptions provided to the patient are listed below.  Medications given during this visit Medications  azithromycin (ZITHROMAX) 500 mg in sodium chloride 0.9 % 250 mL IVPB (500 mg Intravenous New Bag/Given 05/28/23 1421)  magnesium sulfate IVPB 2 g 50 mL (0 g Intravenous Stopped 05/28/23 1417)  ipratropium-albuterol (DUONEB) 0.5-2.5 (3) MG/3ML nebulizer solution 3 mL (3 mLs Nebulization Given 05/28/23 1339)  methylPREDNISolone sodium succinate (SOLU-MEDROL) 125 mg/2 mL injection 125 mg (125 mg Intravenous Given 05/28/23 1341)  cefTRIAXone (ROCEPHIN) 2 g in sodium chloride 0.9 % 100 mL IVPB (0 g Intravenous Stopped 05/28/23 1421)  albuterol (PROVENTIL) (2.5 MG/3ML) 0.083% nebulizer solution 5 mg (5 mg Nebulization Given 05/28/23 1423)  ipratropium-albuterol (DUONEB) 0.5-2.5 (3) MG/3ML nebulizer solution 3 mL (3 mLs Nebulization Given 05/28/23 1423)     The patient appears reasonably screen and/or stabilized for discharge and I doubt any other medical condition or other [facility] requiring further screening, evaluation, or treatment in the ED at this time prior to discharge.         Final Clinical Impression(s) / ED Diagnoses Final diagnoses:  None    Rx / DC Orders ED Discharge Orders          Ordered    predniSONE (DELTASONE) 20 MG tablet        05/28/23 1514    cefpodoxime (VANTIN) 100 MG tablet  2 times daily        05/28/23 1514    azithromycin (ZITHROMAX) 250 MG tablet  Daily        05/28/23 1514              Melene Plan, DO 05/28/23 1517

## 2023-05-29 ENCOUNTER — Encounter (HOSPITAL_BASED_OUTPATIENT_CLINIC_OR_DEPARTMENT_OTHER): Payer: Self-pay

## 2023-05-29 ENCOUNTER — Other Ambulatory Visit (HOSPITAL_BASED_OUTPATIENT_CLINIC_OR_DEPARTMENT_OTHER): Payer: Self-pay

## 2023-06-01 NOTE — Telephone Encounter (Signed)
 Patient was seen by Tammy on 03/12/2023.  Nothing further needed.

## 2023-06-02 LAB — CULTURE, BLOOD (ROUTINE X 2)
Culture: NO GROWTH
Culture: NO GROWTH
Special Requests: ADEQUATE
Special Requests: ADEQUATE

## 2023-06-12 ENCOUNTER — Encounter: Payer: Self-pay | Admitting: Adult Health

## 2023-06-12 ENCOUNTER — Ambulatory Visit: Payer: PPO | Admitting: Adult Health

## 2023-06-12 VITALS — BP 115/62 | HR 91 | Temp 97.4°F | Ht 66.0 in | Wt 163.0 lb

## 2023-06-12 DIAGNOSIS — I2699 Other pulmonary embolism without acute cor pulmonale: Secondary | ICD-10-CM

## 2023-06-12 DIAGNOSIS — J9611 Chronic respiratory failure with hypoxia: Secondary | ICD-10-CM | POA: Diagnosis not present

## 2023-06-12 DIAGNOSIS — C3490 Malignant neoplasm of unspecified part of unspecified bronchus or lung: Secondary | ICD-10-CM | POA: Insufficient documentation

## 2023-06-12 DIAGNOSIS — C3431 Malignant neoplasm of lower lobe, right bronchus or lung: Secondary | ICD-10-CM | POA: Diagnosis not present

## 2023-06-12 DIAGNOSIS — J441 Chronic obstructive pulmonary disease with (acute) exacerbation: Secondary | ICD-10-CM | POA: Diagnosis not present

## 2023-06-12 DIAGNOSIS — J449 Chronic obstructive pulmonary disease, unspecified: Secondary | ICD-10-CM

## 2023-06-12 MED ORDER — PREDNISONE 5 MG PO TABS: ORAL_TABLET | ORAL | 1 refills | Status: DC

## 2023-06-12 MED ORDER — ALBUTEROL SULFATE (2.5 MG/3ML) 0.083% IN NEBU: 2.5000 mg | INHALATION_SOLUTION | Freq: Four times a day (QID) | RESPIRATORY_TRACT | 6 refills | Status: DC | PRN

## 2023-06-12 MED ORDER — BREZTRI AEROSPHERE 160-9-4.8 MCG/ACT IN AERO
2.0000 | INHALATION_SPRAY | Freq: Two times a day (BID) | RESPIRATORY_TRACT | Status: DC
Start: 2023-06-12 — End: 2023-10-11

## 2023-06-12 NOTE — Patient Instructions (Addendum)
 Begin Prednisone  10mg  daily for 2 weeks then go to 5mg  daily -hold at this dose until seen back in office .  Continue Breztri  2 puffs Twice daily  , rinse after use  Albuterol  inhaler or neb As needed  Continue on Zyrtec 10mg  daily  Continue on Omeprazole daily  Continue on Eliquis .  Continue on Oxygen  2l/m, increase 3l/m with activity.  Activity as tolerated.  Follow up in 6-8 weeks and As needed   Please contact office for sooner follow up if symptoms do not improve or worsen or seek emergency care

## 2023-06-12 NOTE — Assessment & Plan Note (Signed)
 Recurrent COPD exacerbation-emergency room notes reviewed in detail.  Patient is clinically improved but continues to have some ongoing intermittent wheezing.  Will continue on triple therapy inhaler with Breztri .  Will add in low-dose prednisone  over the next few weeks.  Hopefully can taper off on return visit.  Patient education given on chronic steroid use.  Plan Patient Instructions  Begin Prednisone  10mg  daily for 2 weeks then go to 5mg  daily -hold at this dose until seen back in office .  Continue Breztri  2 puffs Twice daily  , rinse after use  Albuterol  inhaler or neb As needed  Continue on Zyrtec 10mg  daily  Continue on Omeprazole daily  Continue on Eliquis .  Continue on Oxygen  2l/m, increase 3l/m with activity.  Activity as tolerated.  Follow up in 6-8 weeks and As needed   Please contact office for sooner follow up if symptoms do not improve or worsen or seek emergency care

## 2023-06-12 NOTE — Assessment & Plan Note (Signed)
 Right lower lobe lung cancer-non-small cell carcinoma diagnosed April 09, 2023.  She has completed SBRT.  Patient is to keep follow-up later this month with radiation oncology. Will continue with serial CT imaging.

## 2023-06-12 NOTE — Addendum Note (Signed)
 Addended by: Delrae Rend on: 06/12/2023 01:11 PM   Modules accepted: Orders

## 2023-06-12 NOTE — Assessment & Plan Note (Signed)
History of recurrent PE.  Continue on lifelong anticoagulation therapy with Eliquis.

## 2023-06-12 NOTE — Assessment & Plan Note (Signed)
 Compensated on oxygen, goals are to have O2 saturations greater than 88 to 9%.

## 2023-06-12 NOTE — Addendum Note (Signed)
 Addended by: Delrae Rend on: 06/12/2023 01:29 PM   Modules accepted: Orders

## 2023-06-12 NOTE — Progress Notes (Signed)
 @Patient  ID: [name], female    DOB: 1941/03/09, 83 y.o.   MRN: [id number]  Chief Complaint  Patient presents with   Follow-up    Referring provider: Royden Ronal Czar, [name]  HPI: 83 year old female former smoker followed for severe COPD and chronic respiratory failure on oxygen  and lung cancer (NSCLC-Dx 03/2023) s/p XRT.  History of right lung abscess, recurrent PE/DVT on anticoagulation therapy.   TEST/EVENTS :  CT chest and November 17, 2021 showed emphysema no suspicious pulmonary nodules or masses. No consolidation.    02/13/22>> CT angio chest: Submassive PE, near complete resolution of RLL PNA. 02/14/22>> Echo: EF 70-75%, RV systolic function is severely reduced. 02/14/22>> bilateral lower extremity Doppler: DVT right femoral/popliteal/tibial/peroneal, left femoral vein   01/23/22>> CTA chest: New large alveolar infiltrate in the right lower lobe with cavitary lesion.  No PE. 01/25/22 >> barium esophagogram: No aspiration observed-no strictures/masses. 01/24/22 >> BAL cytology: No malignant cells.    CT chest September 26, 2022 showed resolved cavitary lesion in the right lower lobe.  Enlarged right lower lobe nodule measuring 1.3 cm previously 1.  0 cm.  Bilateral breast implants with intracapsular rupture, emphysema, small subsegmental emboli in the right lower lobe and right middle lobe.  ? chronic.   CT chest February 09, 2023 spiculated right lower lobe nodule slight growth from April 2024 worrisome for bronchogenic carcinoma.  PET scan February 28, 2023 hypermetabolic 15 mm right lower lobe nodule, no hypermetabolic adenopathy.  No findings for pulmonary metastatic disease.   Venous Doppler February 14, 2022 acute DVT right femoral vein, right popliteal vein, right popliteal tibial veins and right peroneal veins, acute DVT involving the left femoral vein   Venous Doppler 09/26/22 negative DVT right lower extremity, DVT left popliteal vein age-indeterminate, chronic deep venous  thrombosis involving the left femoral vein    2D echo February 14, 2022 EF 70 to 75%, grade 1 diastolic dysfunction, right ventricular systolic function severely reduced   Echo November 10, 2022 EF 60 to 65%, grade 1 diastolic dysfunction, RV SF normal,, RV size normal, normal pulmonary artery systolic pressure.   PFT November 16, 2022 showed  severe COPD with FEV1 at 29%, ratio 38, FVC 59%, positive bronchodilator response with FEV1 postbronchodilator at 33%, ratio 36, FVC 69%, DLCO 36%   04/09/23 Nav Bronch- RLL lesion -NSCLC- carcinoma is positive with TTF-1 and negative with p40 and cytokeratin 5/6 consistent with lung adenocarcinoma.   May 14, 2023 MRI brain negative for mets.    06/12/2023 Follow up : COPD, O2 RF and Lung cancer, ER follow-up Patient returns for a ER follow-up.  Patient has severe COPD and is prone to recurrent exacerbations.  She has chronic respiratory failure on home oxygen .  Uses 2 L at rest and 3 L with activity.  Has POC that she uses when away from home.  She also has a history of recurrent PE on lifelong anticoagulation therapy with Eliquis .  She had a spiculated lung nodule with increased growth on CT chest September 2024.  She underwent bronchoscopy on April 09, 2023 with positive cytology for non-small cell carcinoma.  Patient was referred to radiation oncology.  She completed SBRT x 3.  She says she tolerated well.  Did have some fatigue after her last session.  patient was recently treated in the emergency room for a COPD exacerbation May 28, 2023.  She was treated with antibiotics and steroid burst.  She says she is feeling much better.  Still has  intermittent wheezing on /off.   Gets short of breath with minimum activity.  Denies any hemoptysis. She remains on Breztri  twice daily.  Needs refills of her albuterol  nebulizer.  Remains at home. Drives. Does light shopping and house work.    Allergies  Allergen Reactions   Pneumococcal Vaccines Shortness  Of Breath and Other (See Comments)    Couldn't breathe    Clarithromycin Swelling   Estradiol Rash    Reaction to cream   Sulfa Antibiotics Nausea And Vomiting and Swelling   Doxycycline  Hyclate Other (See Comments)    stomach distress   Pneumococcal Polysaccharide Vaccine Other (See Comments)    Unknown   Sulfacetamide Sodium-Sulfur Other (See Comments)   Sulfonamide Derivatives Swelling    Immunization History  Administered Date(s) Administered   Fluad Quad(high Dose 65+) 01/10/2021   Influenza Split 02/27/2011, 02/27/2012, 02/26/2013, 02/26/2014, 02/27/2016, 03/25/2017   Influenza Whole 02/07/2009   Influenza, High Dose Seasonal PF 03/17/2014, 02/27/2017, 03/27/2018   Influenza, Quadrivalent, Recombinant, Inj, Pf 02/25/2018, 04/08/2019, 03/05/2020, 02/11/2021   Influenza,inj,Quad PF,6+ Mos 03/29/2022   Influenza-Unspecified 01/27/2013, 03/05/2015, 03/05/2020   PFIZER(Purple Top)SARS-COV-2 Vaccination 07/12/2019, 08/06/2019, 10/06/2019   Tdap 10/07/2013, 04/23/2022    Past Medical History:  Diagnosis Date   Allergic rhinitis    Anemia due to GI blood loss    Anxiety    Asthma    Chronic airway obstruction, not elsewhere classified    Depressive disorder, not elsewhere classified    DVT (deep venous thrombosis) (HCC)    LLE DVT 01/13/19   Dyspnea    Esophageal reflux    Essential hypertension 08/27/2019   PE (pulmonary thromboembolism) (HCC) 01/29/2019   Pneumonia    Wears partial dentures     Tobacco History: Social History   Tobacco Use  Smoking Status Former   Current packs/day: 0.00   Average packs/day: 0.3 packs/day for 40.0 years (10.0 ttl pk-yrs)   Types: Cigarettes   Start date: 79   Quit date: 2000   Years since quitting: 25.0  Smokeless Tobacco Never   Counseling given: Not Answered   Outpatient Medications Prior to Visit  Medication Sig Dispense Refill   acetaminophen  (TYLENOL ) 500 MG tablet Take 500 mg by mouth every 6 (six) hours as  needed for headache (pain).     albuterol  (VENTOLIN  HFA) 108 (90 Base) MCG/ACT inhaler Inhale 1-2 puffs into the lungs every 6 (six) hours as needed for wheezing or shortness of breath.     apixaban  (ELIQUIS ) 5 MG TABS tablet Take 1 tablet (5 mg total) by mouth 2 (two) times daily. 28 tablet 0   Budeson-Glycopyrrol-Formoterol  (BREZTRI  AEROSPHERE) 160-9-4.8 MCG/ACT AERO Inhale 2 puffs into the lungs in the morning and at bedtime. 10.7 g 2   Budeson-Glycopyrrol-Formoterol  (BREZTRI  AEROSPHERE) 160-9-4.8 MCG/ACT AERO Inhale 2 puffs into the lungs in the morning and at bedtime.     cefdinir  (OMNICEF ) 300 MG capsule Take 1 capsule (300 mg total) by mouth 2 (two) times daily. (Patient not taking: Reported on 04/05/2023) 14 capsule 0   cetirizine (ZYRTEC) 10 MG tablet Take 10 mg by mouth daily.     escitalopram  (LEXAPRO ) 20 MG tablet Take 20 mg by mouth daily.     fluticasone  (FLONASE ) 50 MCG/ACT nasal spray Place 2 sprays into both nostrils daily. (Patient not taking: Reported on 04/05/2023) 9.9 mL 0   furosemide  (LASIX ) 20 MG tablet Take 20 mg by mouth daily as needed for fluid or edema. Takes when having swelling. (Patient not taking: Reported on  03/12/2023)     gabapentin  (NEURONTIN ) 100 MG capsule Take 100 mg by mouth at bedtime. (Patient not taking: Reported on 05/02/2023)     LORazepam  (ATIVAN ) 1 MG tablet Take 1 mg by mouth at bedtime.     omeprazole (PRILOSEC) 20 MG capsule Take 20 mg by mouth 2 (two) times daily before a meal.     traZODone  (DESYREL ) 100 MG tablet Take 100 mg by mouth at bedtime.     triamterene -hydrochlorothiazide  (MAXZIDE -25) 37.5-25 MG tablet Take 0.5 tablets by mouth daily.     albuterol  (PROVENTIL ) (2.5 MG/3ML) 0.083% nebulizer solution Take 3 mLs (2.5 mg total) by nebulization every 6 (six) hours as needed for wheezing or shortness of breath. 75 mL 12   No facility-administered medications prior to visit.     Review of Systems:   Constitutional:   No  weight loss, night  sweats,  Fevers, chills, +fatigue, or  lassitude.  HEENT:   No headaches,  Difficulty swallowing,  Tooth/dental problems, or  Sore throat,                No sneezing, itching, ear ache, nasal congestion, post nasal drip,   CV:  No chest pain,  Orthopnea, PND, swelling in lower extremities, anasarca, dizziness, palpitations, syncope.   GI  No heartburn, indigestion, abdominal pain, nausea, vomiting, diarrhea, change in bowel habits, loss of appetite, bloody stools.   Resp: No shortness of breath with exertion or at rest.  No excess mucus, no productive cough,  No non-productive cough,  No coughing up of blood.  No change in color of mucus.  No wheezing.  No chest wall deformity  Skin: no rash or lesions.  GU: no dysuria, change in color of urine, no urgency or frequency.  No flank pain, no hematuria   MS:  No joint pain or swelling.  No decreased range of motion.  No back pain.    Physical Exam  BP 115/62 (BP Location: Left Wrist, Patient Position: Sitting, Cuff Size: Normal)   Pulse 91   Temp (!) 97.4 F (36.3 C) (Temporal)   Ht 5' 6 (1.676 m)   Wt 163 lb (73.9 kg)   SpO2 90%   BMI 26.31 kg/m   GEN: A/Ox3; pleasant , NAD, well nourished    HEENT:  [place]/AT,  EACs-clear, TMs-wnl, NOSE-clear, THROAT-clear, no lesions, no postnasal drip or exudate noted.   NECK:  Supple w/ fair ROM; no JVD; normal carotid impulses w/o bruits; no thyromegaly or nodules palpated; no lymphadenopathy.    RESP  Clear  P & A; w/o, wheezes/ rales/ or rhonchi. no accessory muscle use, no dullness to percussion  CARD:  RRR, no m/r/g, no peripheral edema, pulses intact, no cyanosis or clubbing.  GI:   Soft & nt; nml bowel sounds; no organomegaly or masses detected.   Musco: Warm bil, no deformities or joint swelling noted.   Neuro: alert, no focal deficits noted.    Skin: Warm, no lesions or rashes    Lab Results:  CBC   BMET     ProBNP No results found for: PROBNP  Imaging: MR  Brain W Wo Contrast Result Date: 05/28/2023 CLINICAL DATA:  Non-small cell lung cancer staging EXAM: MRI HEAD WITHOUT AND WITH CONTRAST TECHNIQUE: Multiplanar, multiecho pulse sequences of the brain and surrounding structures were obtained without and with intravenous contrast. CONTRAST:  7.5 mL Vueway  COMPARISON:  None Available. FINDINGS: Brain: There is no acute infarction or intracranial hemorrhage. There is no intracranial mass,  mass effect, or edema. There is no hydrocephalus or extra-axial fluid collection. Prominence of the ventricles and sulci reflecting generalized parenchymal volume loss. Patchy T2 hyperintensity in the supratentorial white matter is nonspecific but may reflect mild to moderate chronic microvascular ischemic changes. No abnormal enhancement. Vascular: Major vessel flow voids at the skull base are preserved. Skull and upper cervical spine: Normal marrow signal is preserved. Sinuses/Orbits: Paranasal sinuses are aerated. Orbits are unremarkable. Other: Sella is unremarkable.  Mastoid air cells are clear. IMPRESSION: No evidence of intracranial metastatic disease. Electronically Signed   By: Santina Blanch M.D.   On: 05/28/2023 15:41   DG Chest Port 1 View Result Date: 05/28/2023 CLINICAL DATA:  Cough and shortness of breath.  Lung cancer. EXAM: PORTABLE CHEST 1 VIEW COMPARISON:  04/09/2023 FINDINGS: Normal cardiomediastinal contours. Fiducial marker from recent bronchoscopy is identified in the projection of the right hilar region. Chronic coarsened interstitial markings compatible with COPD/emphysema are again noted. Known right lung nodule is suboptimally visualized by portable technique. No signs of pleural effusion, interstitial edema or airspace consolidation. No pneumothorax. IMPRESSION: 1. No acute cardiopulmonary abnormalities. 2. COPD/emphysema. 3. Known right lung nodule is suboptimally visualized by portable technique. Electronically Signed   By: Waddell Calk M.D.   On:  05/28/2023 15:30    Administration History     None          Latest Ref Rng & Units 11/15/2022    8:23 AM  PFT Results  FVC-Pre L 1.72   FVC-Predicted Pre % 59   FVC-Post L 2.01   FVC-Predicted Post % 69   Pre FEV1/FVC % % 38   Post FEV1/FCV % % 36   FEV1-Pre L 0.65   FEV1-Predicted Pre % 29   FEV1-Post L 0.72   DLCO uncorrected ml/min/mmHg 7.38   DLCO UNC% % 36   DLCO corrected ml/min/mmHg 7.31   DLCO COR %Predicted % 36   DLVA Predicted % 49   TLC L 6.16   TLC % Predicted % 115   RV % Predicted % 171     No results found for: NITRICOXIDE      Assessment & Plan:   COPD with acute exacerbation (HCC) Recurrent COPD exacerbation-emergency room notes reviewed in detail.  Patient is clinically improved but continues to have some ongoing intermittent wheezing.  Will continue on triple therapy inhaler with Breztri .  Will add in low-dose prednisone  over the next few weeks.  Hopefully can taper off on return visit.  Patient education given on chronic steroid use.  Plan Patient Instructions  Begin Prednisone  10mg  daily for 2 weeks then go to 5mg  daily -hold at this dose until seen back in office .  Continue Breztri  2 puffs Twice daily  , rinse after use  Albuterol  inhaler or neb As needed  Continue on Zyrtec 10mg  daily  Continue on Omeprazole daily  Continue on Eliquis .  Continue on Oxygen  2l/m, increase 3l/m with activity.  Activity as tolerated.  Follow up in 6-8 weeks and As needed   Please contact office for sooner follow up if symptoms do not improve or worsen or seek emergency care     Chronic respiratory failure with hypoxia (HCC) Compensated on oxygen , goals are to have O2 saturations greater than 88 to 9%.  Pulmonary embolism (HCC) History of recurrent PE.  Continue on lifelong anticoagulation therapy with Eliquis .  Lung cancer (HCC) Right lower lobe lung cancer-non-small cell carcinoma diagnosed April 09, 2023.  She has completed SBRT.  Patient  is to keep follow-up later this month with radiation oncology. Will continue with serial CT imaging.    Madelin Stank, NP 06/12/2023

## 2023-06-15 ENCOUNTER — Encounter (HOSPITAL_COMMUNITY): Payer: Self-pay

## 2023-06-15 NOTE — Progress Notes (Signed)
Referred to Pulmonary rehab by Dr. Tonia Brooms with the diagnosis of COPD 3. Clinical review of pt follow up appt on 06/12/23 with Rubye Oaks, NP - pulmonary office note. Pt appropriate for scheduling for on site Pulmonary Rehab. Will forward to Estate manager/land agent  for scheduling and insurance verification. Durel Salts, RRT, BSRT

## 2023-06-18 ENCOUNTER — Encounter (HOSPITAL_COMMUNITY): Payer: Self-pay

## 2023-06-18 NOTE — Progress Notes (Signed)
Called pt to schedule her for Pulmonary rehab. Pt was unaware that she had been referred. She stated now was not a good time and could we call her back in 2-3 weeks.

## 2023-06-26 ENCOUNTER — Encounter: Payer: Self-pay | Admitting: Radiation Oncology

## 2023-06-27 NOTE — Progress Notes (Signed)
  Radiation Oncology         [phone] ________________________________  Name: [name] MRN: [id number]  Date: 06/28/2023  DOB: Mar 28, 1941  End of Treatment Note  Diagnosis:    The encounter diagnosis was Primary malignant neoplasm of right lower lobe of lung (HCC).   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    Stage IA2 (T1b, N0, M0) non-small cell carcinoma favoring adenocarcinoma of the right lower lung       Indication for treatment:  curative        Radiation treatment dates:    First Treatment Date: 2023-05-15 Last Treatment Date: 2023-05-21  Site/Dose/Technique/Mode:  Plan Name: Lung_R_SBRT Site: Lung, Right Technique: SBRT/SRT-IMRT Mode: Photon Dose Per Fraction: 18 Gy Prescribed Dose (Delivered / Prescribed): 54 Gy / 54 Gy Prescribed Fxs (Delivered / Prescribed): 3 / 3   Narrative: The patient tolerated radiation treatment relatively well. She did report experiencing some RUQ that had persisted for 3-4x and is worsened by laying down or sitting up. She also complained of fatigue and SOB, stating that she's wearing 3 liter oxygen. She also reported one episode of vomiting but endorsed having good appetite.   Plan: The patient has completed radiation treatment. The patient will return to radiation oncology clinic for routine followup in one month. I advised them to call or return sooner if they have any questions or concerns related to their recovery or treatment.  -----------------------------------  Billie Lade, PhD, MD  This document serves as a record of services personally performed by Antony Blackbird, MD. It was created on his behalf by Herbie Saxon, a trained medical scribe. The creation of this record is based on the scribe's personal observations and the provider's statements to them. This document has been checked and approved by the attending provider.

## 2023-06-27 NOTE — Progress Notes (Signed)
Radiation Oncology         [phone]) [phone] ________________________________  Name: [name] MRN: [id number]  Date: 06/28/2023  DOB: Mar 24, 1941  Follow-Up Visit Note  CC: Fatima Sanger, [name]  Icard, Rachel Bo, DO  No diagnosis found.  Diagnosis:   The encounter diagnosis was Primary malignant neoplasm of right lower lobe of lung (HCC).   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    Stage IA2 (T1b, N0, M0) non-small cell carcinoma favoring adenocarcinoma of the right lower lung   Indication for treatment:  curative    Interval Since Last Radiation:  1 month 7 days  Radiation treatment dates:    First Treatment Date: 2023-05-15 Last Treatment Date: 2023-05-21 Site/Dose/Technique/Mode:  Plan Name: Lung_R_SBRT Site: Lung, Right Technique: SBRT/SRT-IMRT Mode: Photon Dose Per Fraction: 18 Gy Prescribed Dose (Delivered / Prescribed): 54 Gy / 54 Gy Prescribed Fxs (Delivered / Prescribed): 3 / 3  Narrative:  The patient returns today for routine follow-up after treatment completion on 05-21-23. She tolerated the treatment relatively well other than experiencing some fatigue, SOB, and RUQ pain.   Since she was last seen, she presented to urgent care on 05-28-23 for dysuria and SOB. She was given nebulizer treatment and a Covid test. She was then send to the ED for further evaluation. She underwent a DG chest which showed chronic coarsened interstitial markings compatible with COPD/emphysema. No acute cardiopulmonary abnormalities was indicated. She was treated with Azithromycin and Cefpodoxime Proxetil.    Subsequently, she followed up with NP Tammy Parrett on 06-12-23. At that time, her symptoms had improved but she continues to experience on and off wheezing. Typically uses 2 L oxygen at rest and 3 L with activity.  She continues to use Breztri and albuterol. CT chest in ordered but not scheduled.   No other significant oncologic interval history  since the patient was last seen.                           Allergies:  is allergic to pneumococcal vaccines, clarithromycin, estradiol, sulfa antibiotics, doxycycline hyclate, pneumococcal polysaccharide vaccine, sulfacetamide sodium-sulfur, and sulfonamide derivatives.  Meds: Current Outpatient Medications  Medication Sig Dispense Refill   acetaminophen (TYLENOL) 500 MG tablet Take 500 mg by mouth every 6 (six) hours as needed for headache (pain).     albuterol (PROVENTIL) (2.5 MG/3ML) 0.083% nebulizer solution Take 3 mLs (2.5 mg total) by nebulization every 6 (six) hours as needed for wheezing or shortness of breath. 120 mL 6   albuterol (VENTOLIN HFA) 108 (90 Base) MCG/ACT inhaler Inhale 1-2 puffs into the lungs every 6 (six) hours as needed for wheezing or shortness of breath.     apixaban (ELIQUIS) 5 MG TABS tablet Take 1 tablet (5 mg total) by mouth 2 (two) times daily. 28 tablet 0   Budeson-Glycopyrrol-Formoterol (BREZTRI AEROSPHERE) 160-9-4.8 MCG/ACT AERO Inhale 2 puffs into the lungs in the morning and at bedtime. 10.7 g 2   Budeson-Glycopyrrol-Formoterol (BREZTRI AEROSPHERE) 160-9-4.8 MCG/ACT AERO Inhale 2 puffs into the lungs in the morning and at bedtime.     Budeson-Glycopyrrol-Formoterol (BREZTRI AEROSPHERE) 160-9-4.8 MCG/ACT AERO Inhale 2 puffs into the lungs in the morning and at bedtime.     cefdinir (OMNICEF) 300 MG capsule Take 1 capsule (300 mg total) by mouth 2 (two) times daily. (Patient not taking: Reported on 04/05/2023) 14 capsule 0   cetirizine (ZYRTEC) 10 MG tablet Take 10 mg by mouth  daily.     escitalopram (LEXAPRO) 20 MG tablet Take 20 mg by mouth daily.     fluticasone (FLONASE) 50 MCG/ACT nasal spray Place 2 sprays into both nostrils daily. (Patient not taking: Reported on 04/05/2023) 9.9 mL 0   furosemide (LASIX) 20 MG tablet Take 20 mg by mouth daily as needed for fluid or edema. Takes when having swelling. (Patient not taking: Reported on 03/12/2023)      gabapentin (NEURONTIN) 100 MG capsule Take 100 mg by mouth at bedtime. (Patient not taking: Reported on 05/02/2023)     LORazepam (ATIVAN) 1 MG tablet Take 1 mg by mouth at bedtime.     omeprazole (PRILOSEC) 20 MG capsule Take 20 mg by mouth 2 (two) times daily before a meal.     predniSONE (DELTASONE) 5 MG tablet 2 tabs daily for 2 weeks then 1 tab daily 45 tablet 1   traZODone (DESYREL) 100 MG tablet Take 100 mg by mouth at bedtime.     triamterene-hydrochlorothiazide (MAXZIDE-25) 37.5-25 MG tablet Take 0.5 tablets by mouth daily.     No current facility-administered medications for this encounter.    Physical Findings: The patient is in no acute distress. Patient is alert and oriented.  vitals were not taken for this visit. .  No significant changes. Lungs are clear to auscultation bilaterally. Heart has regular rate and rhythm. No palpable cervical, supraclavicular, or axillary adenopathy. Abdomen soft, non-tender, normal bowel sounds.   Lab Findings: Lab Results  Component Value Date   WBC 12.0 (H) 05/28/2023   HGB 12.0 05/28/2023   HCT 40.5 05/28/2023   MCV 85.4 05/28/2023   PLT 386 05/28/2023    Radiographic Findings: DG Chest Port 1 View Result Date: 05/28/2023 CLINICAL DATA:  Cough and shortness of breath.  Lung cancer. EXAM: PORTABLE CHEST 1 VIEW COMPARISON:  04/09/2023 FINDINGS: Normal cardiomediastinal contours. Fiducial marker from recent bronchoscopy is identified in the projection of the right hilar region. Chronic coarsened interstitial markings compatible with COPD/emphysema are again noted. Known right lung nodule is suboptimally visualized by portable technique. No signs of pleural effusion, interstitial edema or airspace consolidation. No pneumothorax. IMPRESSION: 1. No acute cardiopulmonary abnormalities. 2. COPD/emphysema. 3. Known right lung nodule is suboptimally visualized by portable technique. Electronically Signed   By: Signa Kell M.D.   On: 05/28/2023 15:30     Impression:  The encounter diagnosis was Primary malignant neoplasm of right lower lobe of lung (HCC).   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    Stage IA2 (T1b, N0, M0) non-small cell carcinoma favoring adenocarcinoma of the right lower lung      The patient is recovering from the effects of radiation.  ***  Plan:  ***   *** minutes of total time was spent for this patient encounter, including preparation, face-to-face counseling with the patient and coordination of care, physical exam, and documentation of the encounter. ____________________________________  Billie Lade, PhD, MD  This document serves as a record of services personally performed by Antony Blackbird, MD. It was created on his behalf by Herbie Saxon, a trained medical scribe. The creation of this record is based on the scribe's personal observations and the provider's statements to them. This document has been checked and approved by the attending provider.

## 2023-06-28 ENCOUNTER — Encounter: Payer: Self-pay | Admitting: Radiation Oncology

## 2023-06-28 ENCOUNTER — Ambulatory Visit
Admission: RE | Admit: 2023-06-28 | Discharge: 2023-06-28 | Disposition: A | Discharge status: Home / Self Care | Payer: PPO | Source: Ambulatory Visit | Attending: Radiation Oncology | Admitting: Radiation Oncology

## 2023-06-28 VITALS — BP 151/87 | HR 85 | Temp 97.5°F | Resp 20 | Ht 66.0 in | Wt 169.2 lb

## 2023-06-28 DIAGNOSIS — C3431 Malignant neoplasm of lower lobe, right bronchus or lung: Secondary | ICD-10-CM | POA: Insufficient documentation

## 2023-06-28 DIAGNOSIS — Z923 Personal history of irradiation: Secondary | ICD-10-CM | POA: Insufficient documentation

## 2023-06-28 HISTORY — DX: Personal history of irradiation: Z92.3

## 2023-06-28 NOTE — Progress Notes (Signed)
[name] is here today for follow up post radiation to the lung.  Lung Side: Right lung, patient completed treatment on 05/21/23.  Does the patient complain of any of the following: Pain:Denies Shortness of breath w/wo exertion: Denies Cough: She reports coughing when she gets hot. Hemoptysis: Denies Pain with swallowing: Denies Swallowing/choking concerns: Denies Appetite: [employer] Level: Low Post radiation skin Changes: Denies   BP (!) 151/87   Pulse 85   Temp (!) 97.5 F (36.4 C) (Temporal)   Resp 20   Ht 5\' 6"  (1.676 m)   Wt 169 lb 4 oz (76.8 kg)   SpO2 97%   BMI 27.32 kg/m

## 2023-07-26 ENCOUNTER — Telehealth: Payer: Self-pay | Admitting: Internal Medicine

## 2023-07-26 NOTE — Telephone Encounter (Signed)
 [facility] Clinical Research Coordinator note:   This visit for Subject [name] with DOB: March 03, 1941 on 07/26/2023. Subject contacted regarding [id number] to inform that Principal Investigator has changed to Dr. Delton Coombes. Patient expressed understanding.

## 2023-07-30 ENCOUNTER — Telehealth: Payer: Self-pay | Admitting: *Deleted

## 2023-07-30 NOTE — Telephone Encounter (Signed)
 CALLED PATIENT TO INFORM OF CT FOR 09-26-23- ARRIVAL TIME- 12:15 PM @ [facility], NO RESTRICTIONS TO SCAN, PATIENT TO RECEIVE RESULTS FROM DR. KINARD ON 10-01-23 @ 11:45 AM, SPOKE WITH PATIENT AND SHE IS AWARE OF THESE APPTS. AND THE INSTRUCTIONS

## 2023-07-31 ENCOUNTER — Encounter: Payer: Self-pay | Admitting: Adult Health

## 2023-07-31 ENCOUNTER — Ambulatory Visit: Payer: PPO | Admitting: Adult Health

## 2023-07-31 VITALS — BP 106/70 | HR 74 | Ht 66.0 in | Wt 167.2 lb

## 2023-07-31 DIAGNOSIS — Z87891 Personal history of nicotine dependence: Secondary | ICD-10-CM | POA: Diagnosis not present

## 2023-07-31 DIAGNOSIS — J449 Chronic obstructive pulmonary disease, unspecified: Secondary | ICD-10-CM

## 2023-07-31 DIAGNOSIS — C3431 Malignant neoplasm of lower lobe, right bronchus or lung: Secondary | ICD-10-CM | POA: Diagnosis not present

## 2023-07-31 DIAGNOSIS — J9611 Chronic respiratory failure with hypoxia: Secondary | ICD-10-CM | POA: Diagnosis not present

## 2023-07-31 MED ORDER — PREDNISONE 5 MG PO TABS: 5.0000 mg | ORAL_TABLET | Freq: Every day | ORAL | 3 refills | Status: DC

## 2023-07-31 NOTE — Progress Notes (Signed)
 @Patient  ID: [name], female    DOB: 01-14-1941, 83 y.o.   MRN: [id number]  Chief Complaint  Patient presents with   Follow-up    Referring provider: Fatima Sanger, [name]  HPI: 83 year old female former smoker followed for severe COPD and chronic respiratory failure on oxygen and lung cancer (non-small cell lung cancer diagnosed November 2024 status post XRT History of right lung abscess, recurrent PE/DVT on anticoagulation therapy  TEST/EVENTS :  CT chest and November 17, 2021 showed emphysema no suspicious pulmonary nodules or masses. No consolidation.    02/13/22>> CT angio chest: Submassive PE, near complete resolution of RLL PNA. 02/14/22>> Echo: EF 70-75%, RV systolic function is severely reduced. 02/14/22>> bilateral lower extremity Doppler: DVT right femoral/popliteal/tibial/peroneal, left femoral vein   01/23/22>> CTA chest: New large alveolar infiltrate in the right lower lobe with cavitary lesion.  No PE. 01/25/22 >> barium esophagogram: No aspiration observed-no strictures/masses. 01/24/22 >> BAL cytology: No malignant cells.    CT chest September 26, 2022 showed resolved cavitary lesion in the right lower lobe.  Enlarged right lower lobe nodule measuring 1.3 cm previously 1.  0 cm.  Bilateral breast implants with intracapsular rupture, emphysema, small subsegmental emboli in the right lower lobe and right middle lobe.  ? chronic.   CT chest February 09, 2023 spiculated right lower lobe nodule slight growth from April 2024 worrisome for bronchogenic carcinoma.   PET scan February 28, 2023 hypermetabolic 15 mm right lower lobe nodule, no hypermetabolic adenopathy.  No findings for pulmonary metastatic disease.   Venous Doppler February 14, 2022 acute DVT right femoral vein, right popliteal vein, right popliteal tibial veins and right peroneal veins, acute DVT involving the left femoral vein   Venous Doppler 09/26/22 negative DVT right lower extremity, DVT left popliteal vein  age-indeterminate, chronic deep venous thrombosis involving the left femoral vein    2D echo February 14, 2022 EF 70 to 75%, grade 1 diastolic dysfunction, right ventricular systolic function severely reduced   Echo November 10, 2022 EF 60 to 65%, grade 1 diastolic dysfunction, RV SF normal,, RV size normal, normal pulmonary artery systolic pressure.   PFT November 16, 2022 showed  severe COPD with FEV1 at 29%, ratio 38, FVC 59%, positive bronchodilator response with FEV1 postbronchodilator at 33%, ratio 36, FVC 69%, DLCO 36%   04/09/23 Nav Bronch- RLL lesion -NSCLC- carcinoma is positive with TTF-1 and negative with p40 and cytokeratin 5/6 consistent with lung adenocarcinoma.    May 14, 2023 MRI brain negative for mets.  07/31/2023 Follow up ; COPD, O2 RF, Lung cancer  Patient returns for 59-month follow-up.  She is followed for severe COPD, prone to recurrent exacerbations.  She says overall breathing has been doing about the same.  Gets short of breath with heavy activities.  Was started on low-dose prednisone last visit to help with recurrent exacerbations. She is on oxygen 2 L at rest and 3 L with activity.  She has a POC when she is away from home. She has a history of recurrent PE on lifelong anticoagulation with Eliquis.  Denies any known bleeding.  Patient education given Patient was diagnosed with lung cancer November 2024.  CT chest showed spiculated nodule with increased growth.  Bronchoscopy November 2020 for positive cytology for non-small cell carcinoma.  She was referred to radiation oncology.  Completed SBRT x 3.  Has upcoming surveillance CT chest next month. Says she is doing better and has increased energy. No flare of cough  or wheezing.      Allergies  Allergen Reactions   Pneumococcal Vaccines Shortness Of Breath and Other (See Comments)    Couldn't breathe    Clarithromycin Swelling   Estradiol Rash    Reaction to cream   Sulfa Antibiotics Nausea And Vomiting and  Swelling   Doxycycline Hyclate Other (See Comments)    stomach distress   Pneumococcal Polysaccharide Vaccine Other (See Comments)    Unknown   Sulfacetamide Sodium-Sulfur Other (See Comments)   Sulfonamide Derivatives Swelling    Immunization History  Administered Date(s) Administered   Fluad Quad(high Dose 65+) 01/10/2021   Influenza Split 02/27/2011, 02/27/2012, 02/26/2013, 02/26/2014, 02/27/2016, 03/25/2017   Influenza Whole 02/07/2009   Influenza, High Dose Seasonal PF 03/17/2014, 02/27/2017, 03/27/2018   Influenza, Quadrivalent, Recombinant, Inj, Pf 02/25/2018, 04/08/2019, 03/05/2020, 02/11/2021   Influenza,inj,Quad PF,6+ Mos 03/29/2022   Influenza-Unspecified 01/27/2013, 03/05/2015, 03/05/2020   PFIZER(Purple Top)SARS-COV-2 Vaccination 07/12/2019, 08/06/2019, 10/06/2019   Tdap 10/07/2013, 04/23/2022    Past Medical History:  Diagnosis Date   Allergic rhinitis    Anemia due to GI blood loss    Anxiety    Asthma    Chronic airway obstruction, not elsewhere classified    Depressive disorder, not elsewhere classified    DVT (deep venous thrombosis) (HCC)    LLE DVT 01/13/19   Dyspnea    Esophageal reflux    Essential hypertension 08/27/2019   History of radiation therapy    Right lung-05/15/23-05/21/23- Dr. Antony Blackbird   PE (pulmonary thromboembolism) (HCC) 01/29/2019   Pneumonia    Wears partial dentures     Tobacco History: Social History   Tobacco Use  Smoking Status Former   Current packs/day: 0.00   Average packs/day: 0.3 packs/day for 40.0 years (10.0 ttl pk-yrs)   Types: Cigarettes   Start date: 23   Quit date: 2000   Years since quitting: 25.1  Smokeless Tobacco Never   Counseling given: Not Answered   Outpatient Medications Prior to Visit  Medication Sig Dispense Refill   acetaminophen (TYLENOL) 500 MG tablet Take 500 mg by mouth every 6 (six) hours as needed for headache (pain).     albuterol (PROVENTIL) (2.5 MG/3ML) 0.083% nebulizer  solution Take 3 mLs (2.5 mg total) by nebulization every 6 (six) hours as needed for wheezing or shortness of breath. 120 mL 6   albuterol (VENTOLIN HFA) 108 (90 Base) MCG/ACT inhaler Inhale 1-2 puffs into the lungs every 6 (six) hours as needed for wheezing or shortness of breath.     apixaban (ELIQUIS) 5 MG TABS tablet Take 1 tablet (5 mg total) by mouth 2 (two) times daily. 28 tablet 0   Budeson-Glycopyrrol-Formoterol (BREZTRI AEROSPHERE) 160-9-4.8 MCG/ACT AERO Inhale 2 puffs into the lungs in the morning and at bedtime.     cetirizine (ZYRTEC) 10 MG tablet Take 10 mg by mouth daily.     escitalopram (LEXAPRO) 20 MG tablet Take 20 mg by mouth daily.     fluticasone (FLONASE) 50 MCG/ACT nasal spray Place 2 sprays into both nostrils daily. 9.9 mL 0   furosemide (LASIX) 20 MG tablet Take 20 mg by mouth daily as needed for fluid or edema. Takes when having swelling.     LORazepam (ATIVAN) 1 MG tablet Take 1 mg by mouth at bedtime.     omeprazole (PRILOSEC) 20 MG capsule Take 20 mg by mouth 2 (two) times daily before a meal.     traZODone (DESYREL) 100 MG tablet Take 100 mg by mouth at bedtime.  triamterene-hydrochlorothiazide (MAXZIDE-25) 37.5-25 MG tablet Take 0.5 tablets by mouth daily.     predniSONE (DELTASONE) 5 MG tablet 2 tabs daily for 2 weeks then 1 tab daily 45 tablet 1   Budeson-Glycopyrrol-Formoterol (BREZTRI AEROSPHERE) 160-9-4.8 MCG/ACT AERO Inhale 2 puffs into the lungs in the morning and at bedtime. 10.7 g 2   Budeson-Glycopyrrol-Formoterol (BREZTRI AEROSPHERE) 160-9-4.8 MCG/ACT AERO Inhale 2 puffs into the lungs in the morning and at bedtime.     cefdinir (OMNICEF) 300 MG capsule Take 1 capsule (300 mg total) by mouth 2 (two) times daily. (Patient not taking: Reported on 04/05/2023) 14 capsule 0   gabapentin (NEURONTIN) 100 MG capsule Take 100 mg by mouth at bedtime. (Patient not taking: Reported on 05/02/2023)     No facility-administered medications prior to visit.     Review  of Systems:   Constitutional:   No  weight loss, night sweats,  Fevers, chills, fatigue, or  lassitude.  HEENT:   No headaches,  Difficulty swallowing,  Tooth/dental problems, or  Sore throat,                No sneezing, itching, ear ache, nasal congestion, post nasal drip,   CV:  No chest pain,  Orthopnea, PND, swelling in lower extremities, anasarca, dizziness, palpitations, syncope.   GI  No heartburn, indigestion, abdominal pain, nausea, vomiting, diarrhea, change in bowel habits, loss of appetite, bloody stools.   Resp:   No chest wall deformity  Skin: no rash or lesions.  GU: no dysuria, change in color of urine, no urgency or frequency.  No flank pain, no hematuria   MS:  No joint pain or swelling.  No decreased range of motion.  No back pain.    Physical Exam  BP 106/70 (BP Location: Left Arm, Patient Position: Sitting, Cuff Size: Normal)   Pulse 74   Ht 5\' 6"  (1.676 m)   Wt 167 lb 3.2 oz (75.8 kg)   SpO2 94% Comment: 2L  BMI 26.99 kg/m   GEN: A/Ox3; pleasant , NAD, well nourished , on o2    HEENT:  Vass/AT,  EACs-clear, TMs-wnl, NOSE-clear, THROAT-clear, no lesions, no postnasal drip or exudate noted.   NECK:  Supple w/ fair ROM; no JVD; normal carotid impulses w/o bruits; no thyromegaly or nodules palpated; no lymphadenopathy.    RESP  Clear  P & A; w/o, wheezes/ rales/ or rhonchi. no accessory muscle use, no dullness to percussion  CARD:  RRR, no m/r/g, no peripheral edema, pulses intact, no cyanosis or clubbing.  GI:   Soft & nt; nml bowel sounds; no organomegaly or masses detected.   Musco: Warm bil, no deformities or joint swelling noted.   Neuro: alert, no focal deficits noted.    Skin: Warm, no lesions or rashes    Lab Results:   BNP   ProBNP No results found for: "PROBNP"  Imaging: No results found.  Administration History     None          Latest Ref Rng & Units 11/15/2022    8:23 AM  PFT Results  FVC-Pre L 1.72   FVC-Predicted  Pre % 59   FVC-Post L 2.01   FVC-Predicted Post % 69   Pre FEV1/FVC % % 38   Post FEV1/FCV % % 36   FEV1-Pre L 0.65   FEV1-Predicted Pre % 29   FEV1-Post L 0.72   DLCO uncorrected ml/min/mmHg 7.38   DLCO UNC% % 36   DLCO corrected ml/min/mmHg 7.31  DLCO COR %Predicted % 36   DLVA Predicted % 49   TLC L 6.16   TLC % Predicted % 115   RV % Predicted % 171     No results found for: "NITRICOXIDE"      Assessment & Plan:   COPD (chronic obstructive pulmonary disease) (HCC) Severe COPD-prone to recurrent exacerbations.  Last visit was started on low-dose steroids.  Clinically appears to be improving without exacerbation since last visit.  Previous CBC showed absolute eosinophil count at 100.  For now we will continue on low-dose prednisone at 5 mg daily.  Continue on triple therapy maintenance inhaler on return visit in 6 to 8 weeks would consider tapering to off of steroids.  Could be a candidate for Biologics with Dupixent and or add in Ohtuvayre to help with symptom management and recurrent flares.  Plan  . Patient Instructions  Continue on Prednisone 5mg  daily.  Continue Breztri 2 puffs Twice daily  , rinse after use  Albuterol inhaler or neb As needed  Continue on Zyrtec 10mg  daily  Continue on Omeprazole daily  Continue on Eliquis.  Continue on Oxygen 2l/m, increase 3l/m with activity.  Activity as tolerated.  CT chest as planned next month  Follow up in 6-8 weeks with Dr. Celine Mans or Lucio Litsey NP and As needed  -30 min slot  Please contact office for sooner follow up if symptoms do not improve or worsen or seek emergency care     Chronic respiratory failure with hypoxia (HCC) Continue on oxygen to maintain O2 saturations greater than 88 to 90%.  Lung cancer [facility]) Right lower lobe lung cancer diagnosed November 2024.  Has completed SBRT.  Has upcoming surveillance CT chest next month.  Continue follow-up with radiation oncology and serial imaging     Rubye Oaks,  NP 07/31/2023

## 2023-07-31 NOTE — Assessment & Plan Note (Signed)
 Severe COPD-prone to recurrent exacerbations.  Last visit was started on low-dose steroids.  Clinically appears to be improving without exacerbation since last visit.  Previous CBC showed absolute eosinophil count at 100.  For now we will continue on low-dose prednisone at 5 mg daily.  Continue on triple therapy maintenance inhaler on return visit in 6 to 8 weeks would consider tapering to off of steroids.  Could be a candidate for Biologics with Dupixent and or add in Ohtuvayre to help with symptom management and recurrent flares.  Plan  . Patient Instructions  Continue on Prednisone 5mg  daily.  Continue Breztri 2 puffs Twice daily  , rinse after use  Albuterol inhaler or neb As needed  Continue on Zyrtec 10mg  daily  Continue on Omeprazole daily  Continue on Eliquis.  Continue on Oxygen 2l/m, increase 3l/m with activity.  Activity as tolerated.  CT chest as planned next month  Follow up in 6-8 weeks with Dr. Celine Mans or Aurie Harroun NP and As needed  -30 min slot  Please contact office for sooner follow up if symptoms do not improve or worsen or seek emergency care

## 2023-07-31 NOTE — Patient Instructions (Addendum)
 Continue on Prednisone 5mg  daily.  Continue Breztri 2 puffs Twice daily  , rinse after use  Albuterol inhaler or neb As needed  Continue on Zyrtec 10mg  daily  Continue on Omeprazole daily  Continue on Eliquis.  Continue on Oxygen 2l/m, increase 3l/m with activity.  Activity as tolerated.  CT chest as planned next month  Follow up in 6-8 weeks with Dr. Celine Mans or Ryszard Socarras NP and As needed  -30 min slot  Please contact office for sooner follow up if symptoms do not improve or worsen or seek emergency care

## 2023-07-31 NOTE — Assessment & Plan Note (Addendum)
Continue on oxygen to maintain O2 saturations greater than 88 to 90%. 

## 2023-07-31 NOTE — Assessment & Plan Note (Signed)
 Right lower lobe lung cancer diagnosed November 2024.  Has completed SBRT.  Has upcoming surveillance CT chest next month.  Continue follow-up with radiation oncology and serial imaging

## 2023-08-06 ENCOUNTER — Ambulatory Visit: Payer: PPO | Admitting: Pulmonary Disease

## 2023-09-11 DIAGNOSIS — E7800 Pure hypercholesterolemia, unspecified: Secondary | ICD-10-CM | POA: Diagnosis not present

## 2023-09-11 DIAGNOSIS — E559 Vitamin D deficiency, unspecified: Secondary | ICD-10-CM | POA: Diagnosis not present

## 2023-09-11 DIAGNOSIS — Z0000 Encounter for general adult medical examination without abnormal findings: Secondary | ICD-10-CM | POA: Diagnosis not present

## 2023-09-18 DIAGNOSIS — E7800 Pure hypercholesterolemia, unspecified: Secondary | ICD-10-CM | POA: Diagnosis not present

## 2023-09-18 DIAGNOSIS — E559 Vitamin D deficiency, unspecified: Secondary | ICD-10-CM | POA: Diagnosis not present

## 2023-09-18 DIAGNOSIS — Z0000 Encounter for general adult medical examination without abnormal findings: Secondary | ICD-10-CM | POA: Diagnosis not present

## 2023-09-18 DIAGNOSIS — J449 Chronic obstructive pulmonary disease, unspecified: Secondary | ICD-10-CM | POA: Diagnosis not present

## 2023-09-18 DIAGNOSIS — L309 Dermatitis, unspecified: Secondary | ICD-10-CM | POA: Diagnosis not present

## 2023-09-18 DIAGNOSIS — Z7901 Long term (current) use of anticoagulants: Secondary | ICD-10-CM | POA: Diagnosis not present

## 2023-09-18 DIAGNOSIS — F5101 Primary insomnia: Secondary | ICD-10-CM | POA: Diagnosis not present

## 2023-09-18 DIAGNOSIS — N390 Urinary tract infection, site not specified: Secondary | ICD-10-CM | POA: Diagnosis not present

## 2023-09-18 DIAGNOSIS — Z86711 Personal history of pulmonary embolism: Secondary | ICD-10-CM | POA: Diagnosis not present

## 2023-09-26 ENCOUNTER — Ambulatory Visit (HOSPITAL_COMMUNITY)
Admission: RE | Admit: 2023-09-26 | Discharge: 2023-09-26 | Disposition: A | Discharge status: Home / Self Care | Source: Ambulatory Visit | Attending: Radiology | Admitting: Radiology

## 2023-09-26 DIAGNOSIS — C3431 Malignant neoplasm of lower lobe, right bronchus or lung: Secondary | ICD-10-CM | POA: Insufficient documentation

## 2023-09-26 DIAGNOSIS — I700 Atherosclerosis of aorta: Secondary | ICD-10-CM | POA: Diagnosis not present

## 2023-09-26 DIAGNOSIS — J439 Emphysema, unspecified: Secondary | ICD-10-CM | POA: Diagnosis not present

## 2023-09-29 IMAGING — US US ABDOMEN COMPLETE
1 series · 14 of 25 positions shown · non-contrast
Comparison: CT 11/22/2019

CLINICAL DATA: Epigastric pain

EXAM:
ABDOMEN ULTRASOUND COMPLETE

[Series 1: us abdomen complete · 0.30mm/px · 14 of 142 slices shown]
[im 1/142]
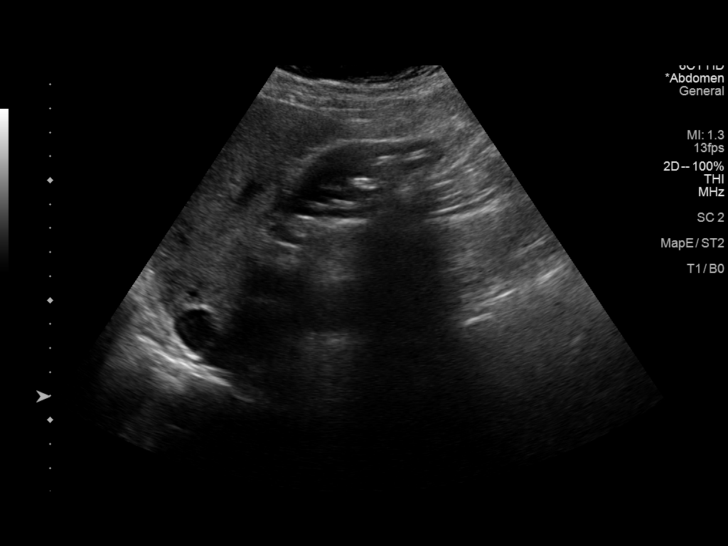
[im 12/142]
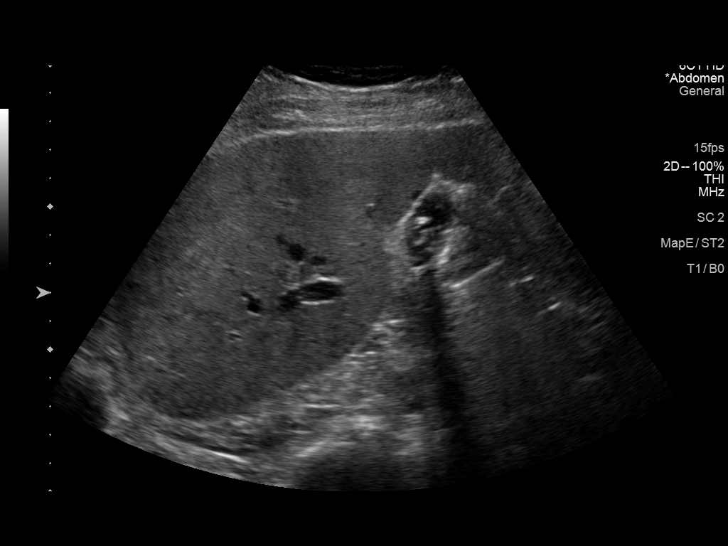
[im 24/142]
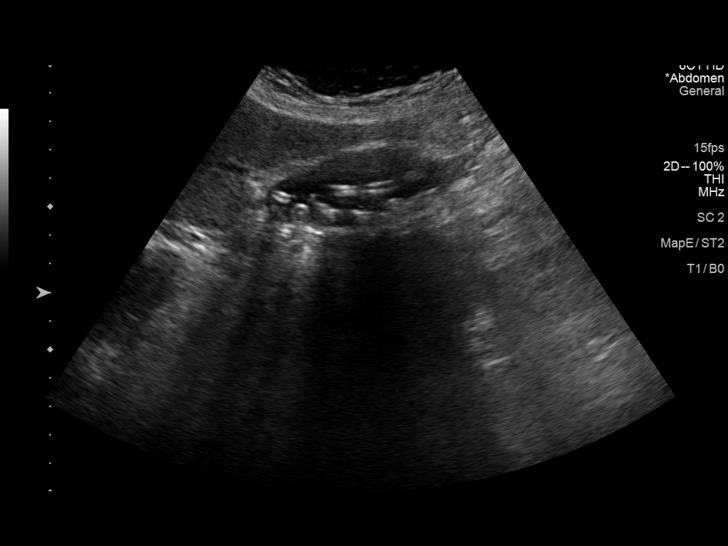
[im 36/142]
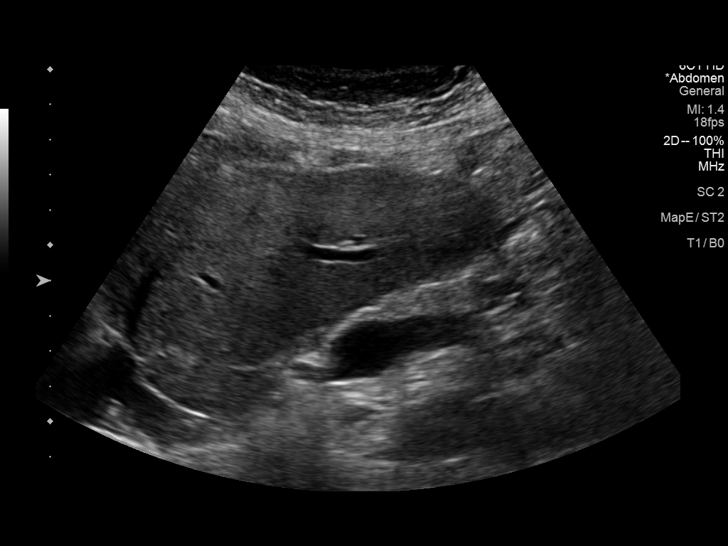
[im 48/142]
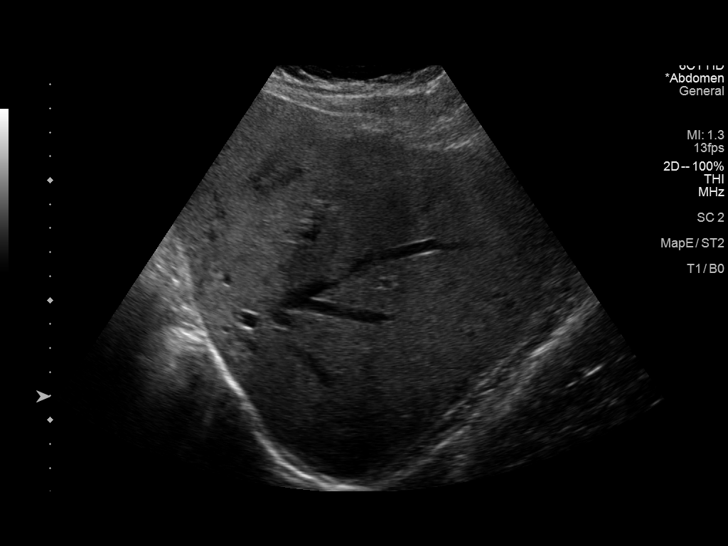
[im 53/142]
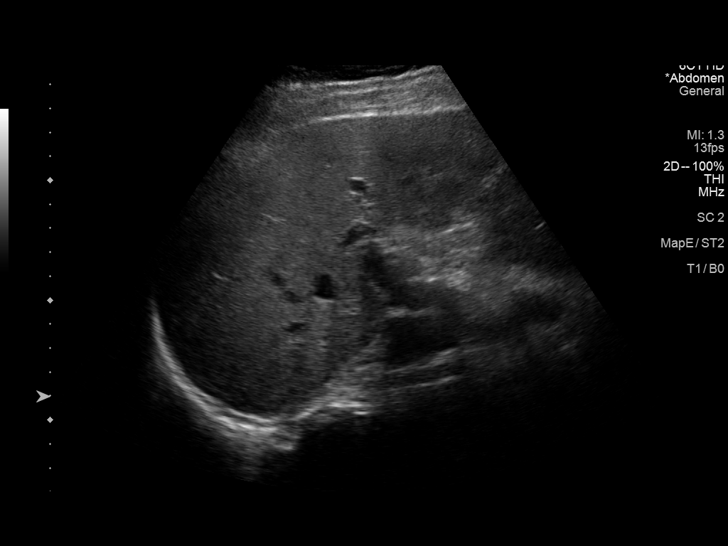
[im 65/142]
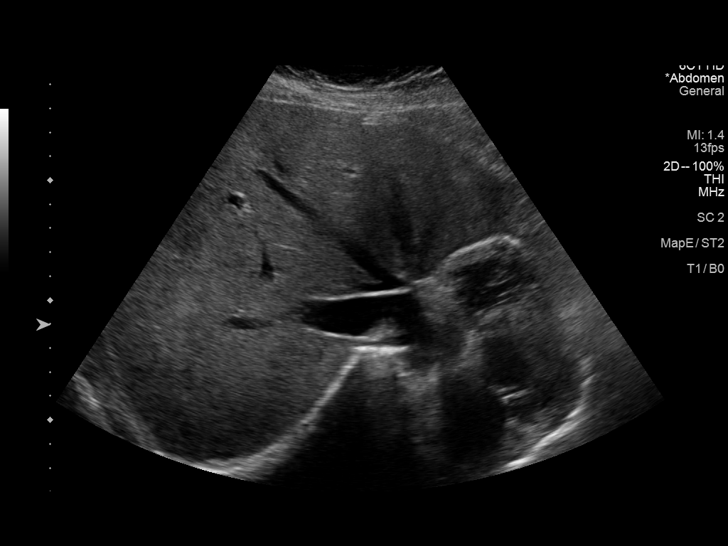
[im 77/142]
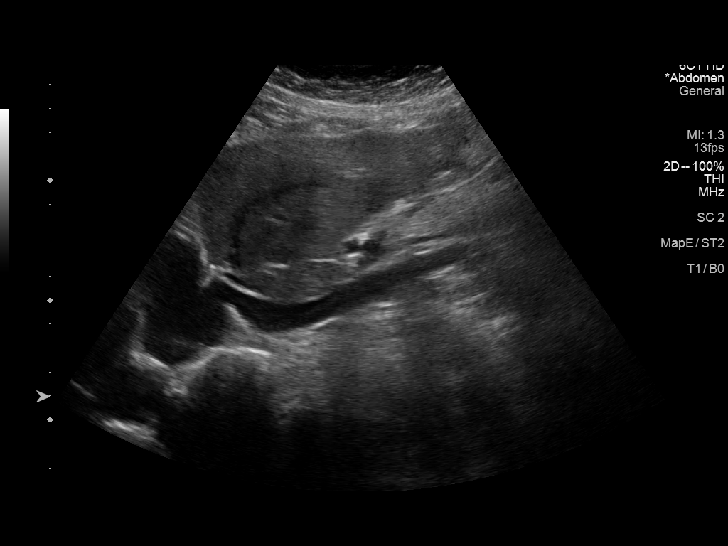
[im 89/142]
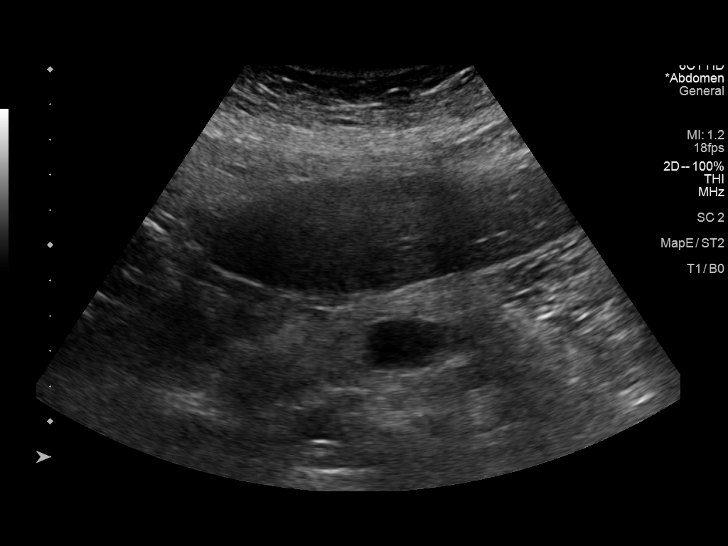
[im 95/142]
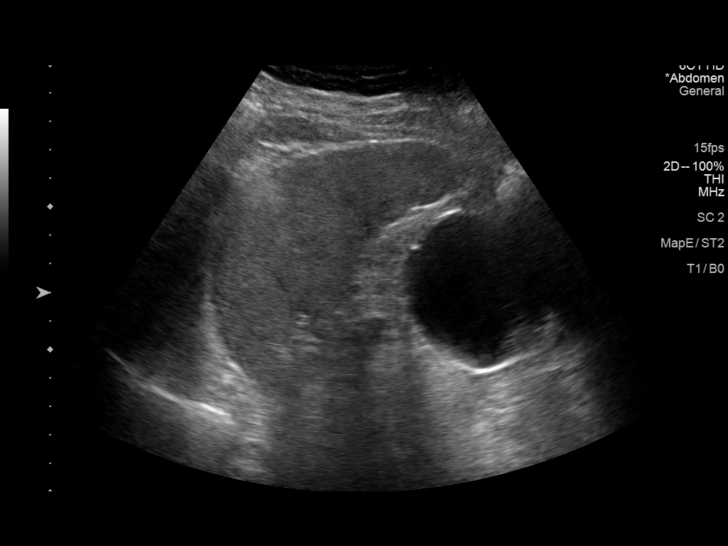
[im 106/142]
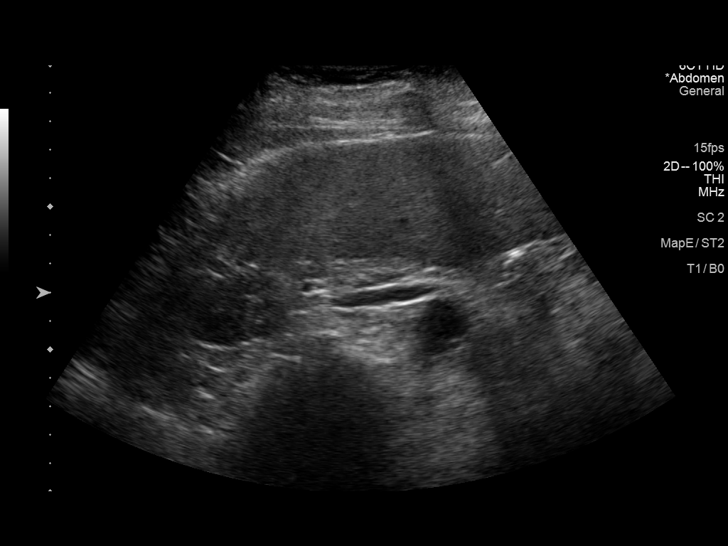
[im 118/142]
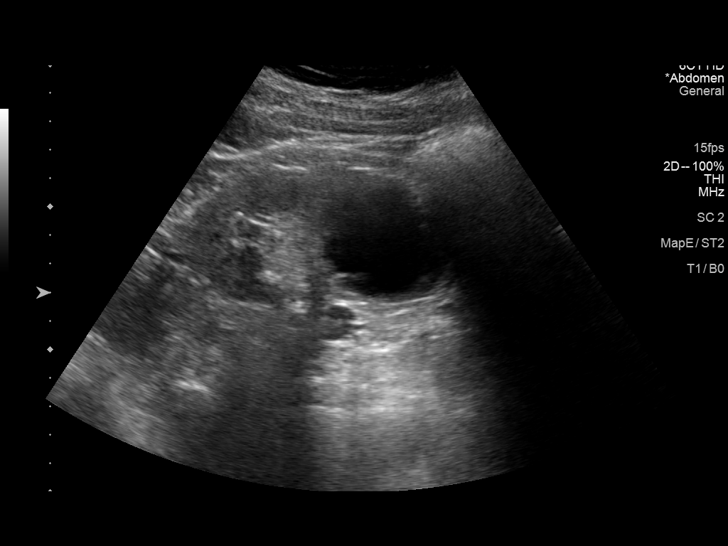
[im 130/142]
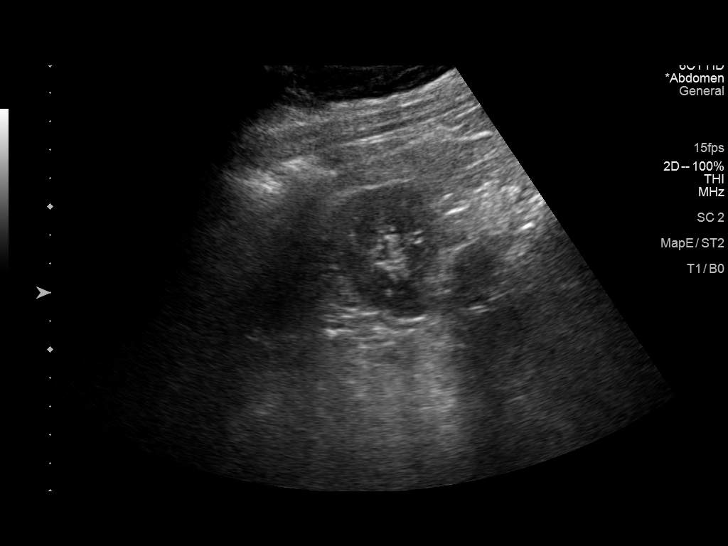
[im 142/142]
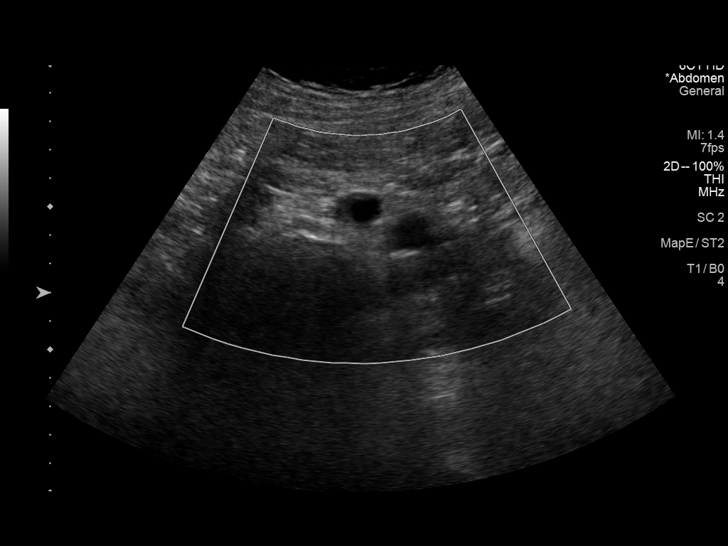

[14 of 25 positions shown; findings below may reference images not displayed]

FINDINGS: Gallbladder: Multiple gallstones. Slight increased wall thickness of
4 mm but negative sonographic Murphy.

Common bile duct: Diameter: 5.8 mm

Liver: No focal lesion identified. Within normal limits in
parenchymal echogenicity. Portal vein is patent on color Doppler
imaging with normal direction of blood flow towards the liver.

IVC: No abnormality visualized.

Pancreas: Visualized portion unremarkable.

Spleen: Size and appearance within normal limits.

Right Kidney: Length: 10.3 cm. Echogenicity within normal limits. No
mass or hydronephrosis visualized.

Left Kidney: Length: 10.6 cm. Echogenicity within normal limits. No
hydronephrosis. Cyst at the mid left kidney measuring 5.7 cm

Abdominal aorta: No aneurysm visualized.

Other findings: None.
IMPRESSION: 1. Cholelithiasis with slight wall thickening but negative
sonographic Jozotrim. Correlation with nuclear medicine
hepatobiliary imaging could be considered.
2. 5.7 cm left renal cyst.

## 2023-09-30 NOTE — Progress Notes (Incomplete)
 Radiation Oncology         [phone]) [phone] ________________________________  Name: [name] MRN: [id number]  Date: 10/01/2023  DOB: 1941-05-02  Follow-Up Visit Note  CC: Jhon Moselle, [name]  Prudy Brownie, DO  No diagnosis found.  Diagnosis:  The encounter diagnosis was Primary malignant neoplasm of right lower lobe of lung (HCC).   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 She was last seen here for follow-up on 06/28/23.         Her most recent chest CT performed on 09/26/23 demonstrates a decrease in size of the RLL pulmonary nodule when compared to imaging performed on 02/09/23, now measuring 9 x 12 mm, and no new focal pulmonary nodules or infiltrates. (Other findings of potential clinical significance include extensive multi-vessel coronary artery calcifications, and stable fusiform enlargement of the ascending aorta measuring approximately 4.1 cm in the greatest extent.          She has also continued to follow with [facility] for her history of COPD w/ recurrent exacerbations and most recently met with NP Parrett on 07/31/23. She was noted to be doing relatively well at that time and denied any changes in her breathing. She has exertional SOB with heavy activities at baseline and was started on low-dose prednisone  at  her previous visit with pulmonology in December to help with her recurrent exacerbations. Given her stable respiratory status, she was instructed to continue on low-dose prednisone  (5 mg) at that time. Her inhaler regimen was kept that same (triple therapy maintenance inhaler).  She also continues to require 2L supplemental O2 (via [place]) at rest and 3L with activity.      No other significant interval history since the patient was last seen for follow-up.   ***         Allergies:  is allergic to pneumococcal vaccines, clarithromycin, estradiol, sulfa antibiotics, doxycycline  hyclate, pneumococcal polysaccharide vaccine, sulfacetamide sodium-sulfur, and sulfonamide derivatives.  Meds: Current Outpatient Medications  Medication Sig Dispense Refill   acetaminophen  (TYLENOL ) 500 MG tablet Take 500 mg by mouth every 6 (six) hours as needed for headache (pain).     albuterol  (PROVENTIL ) (2.5 MG/3ML) 0.083% nebulizer solution Take 3 mLs (2.5 mg total) by nebulization every 6 (six) hours as needed for wheezing or shortness of breath. 120 mL 6   albuterol  (VENTOLIN  HFA) 108 (90 Base) MCG/ACT inhaler Inhale 1-2 puffs into the lungs every 6 (six) hours as needed for wheezing or shortness of breath.     apixaban  (ELIQUIS ) 5 MG TABS tablet Take 1 tablet (5 mg total) by mouth 2 (two) times daily. 28 tablet 0   Budeson-Glycopyrrol-Formoterol  (BREZTRI  AEROSPHERE) 160-9-4.8 MCG/ACT AERO Inhale 2 puffs into the lungs in the morning and at bedtime. 10.7 g 2   Budeson-Glycopyrrol-Formoterol  (BREZTRI  AEROSPHERE) 160-9-4.8 MCG/ACT AERO Inhale 2 puffs into the lungs in the morning and at  bedtime.     Budeson-Glycopyrrol-Formoterol  (BREZTRI  AEROSPHERE) 160-9-4.8 MCG/ACT AERO Inhale 2 puffs into the lungs in the morning and at bedtime.     cefdinir  (OMNICEF ) 300 MG capsule Take 1 capsule (300 mg total) by mouth 2 (two) times daily. (Patient not taking: Reported on 04/05/2023) 14 capsule 0   cetirizine (ZYRTEC) 10 MG tablet  Take 10 mg by mouth daily.     escitalopram  (LEXAPRO ) 20 MG tablet Take 20 mg by mouth daily.     fluticasone  (FLONASE ) 50 MCG/ACT nasal spray Place 2 sprays into both nostrils daily. 9.9 mL 0   furosemide  (LASIX ) 20 MG tablet Take 20 mg by mouth daily as needed for fluid or edema. Takes when having swelling.     LORazepam  (ATIVAN ) 1 MG tablet Take 1 mg by mouth at bedtime.     omeprazole (PRILOSEC) 20 MG capsule Take 20 mg by mouth 2 (two) times daily before a meal.     predniSONE  (DELTASONE ) 5 MG tablet Take 1 tablet (5 mg total) by mouth daily with breakfast. 30 tablet 3   traZODone  (DESYREL ) 100 MG tablet Take 100 mg by mouth at bedtime.     triamterene -hydrochlorothiazide  (MAXZIDE -25) 37.5-25 MG tablet Take 0.5 tablets by mouth daily.     No current facility-administered medications for this encounter.    Physical Findings: The patient is in no acute distress. Patient is alert and oriented.  vitals were not taken for this visit. .  No significant changes. Lungs are clear to auscultation bilaterally. Heart has regular rate and rhythm. No palpable cervical, supraclavicular, or axillary adenopathy. Abdomen soft, non-tender, normal bowel sounds.   Lab Findings: Lab Results  Component Value Date   WBC 12.0 (H) 05/28/2023   HGB 12.0 05/28/2023   HCT 40.5 05/28/2023   MCV 85.4 05/28/2023   PLT 386 05/28/2023    Radiographic Findings: CT CHEST WO CONTRAST Result Date: 09/27/2023 CLINICAL DATA:  Non-small cell lung cancer, follow-up examination. * Tracking Code: BO * EXAM: CT CHEST WITHOUT CONTRAST TECHNIQUE: Multidetector CT imaging of the chest was performed following the standard protocol without IV contrast. RADIATION DOSE REDUCTION: This exam was performed according to the departmental dose-optimization program which includes automated exposure control, adjustment of the mA and/or kV according to patient size and/or use of iterative reconstruction technique. COMPARISON:  02/09/2023  FINDINGS: Cardiovascular: Extensive multi-vessel coronary artery calcification. Global cardiac size within normal limits. No pericardial effusion. Central pulmonary arteries are are stably enlarged in keeping with changes of pulmonary arterial hypertension. Mild atherosclerotic calcification within the thoracic aorta. Stable fusiform enlargement of the ascending aorta measuring 4.1 cm in greatest dimension. Descending thoracic aorta is of normal caliber. Mediastinum/Nodes: No enlarged mediastinal or axillary lymph nodes. Thyroid  gland, trachea, and esophagus demonstrate no significant findings. Lungs/Pleura: Spiculated nodule within the right lower lobe, axial image # 67/6 measures 9 x 12 mm, decreased in size since prior examination. No new focal pulmonary nodules or infiltrates. Severe emphysema. Stable parenchymal scarring within the right middle and lower lobes. No pneumothorax or pleural effusion. No central obstructing lesion. Upper Abdomen: No acute abnormality. No pathologic adenopathy within the visualized upper abdomen retroperitoneum. Adrenal glands are unremarkable. Musculoskeletal: No acute bone abnormality. No lytic or blastic bone lesion. Osseous structures are age appropriate. Bilateral breast implants again noted. IMPRESSION: 1. Spiculated right lower lobe pulmonary nodule corresponding the patient's known primary malignancy, decreased in size since prior examination of 02/09/2023. No new focal pulmonary nodules or infiltrates. 2. Extensive multi-vessel coronary artery  calcification. 3. Stable fusiform enlargement of the ascending aorta measuring 4.1 cm in greatest dimension. Recommend annual imaging followup by CTA or MRA. This recommendation follows 2010 ACCF/AHA/AATS/ACR/ASA/SCA/SCAI/SIR/STS/SVM Guidelines for the Diagnosis and Management of Patients with Thoracic Aortic Disease. Circulation. 2010; 121: R604-V409. Aortic aneurysm NOS (ICD10-I71.9) 4. Enlarged central pulmonary arteries in keeping  with changes of pulmonary arterial hypertension. Aortic Atherosclerosis (ICD10-I70.0) and Emphysema (ICD10-J43.9). Electronically Signed   By: Worthy Heads M.D.   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  ***  Plan:  ***   *** minutes of total time was spent for this patient encounter, including preparation, face-to-face counseling with the patient and coordination of care, physical exam, and documentation of the encounter. ____________________________________  Noralee Beam, PhD, MD  This document serves as a record of services personally performed by Retta Caster, MD. It was created on his behalf by Aleta Anda, a trained medical scribe. The creation of this record is based on the scribe's personal observations and the provider's statements to them. This document has been checked and approved by the attending provider.

## 2023-10-01 ENCOUNTER — Ambulatory Visit
Admission: RE | Admit: 2023-10-01 | Discharge: 2023-10-01 | Disposition: A | Discharge status: Home / Self Care | Payer: Self-pay | Source: Ambulatory Visit | Attending: Radiation Oncology | Admitting: Radiation Oncology

## 2023-10-03 NOTE — Progress Notes (Signed)
 Radiation Oncology         [phone] ________________________________  Name: [name] MRN: [id number]  Date: 10/04/2023  DOB: [birth year]  Follow-Up Visit Note  CC: Jhon Moselle, [name]  Prudy Brownie, DO  No diagnosis found.  Diagnosis:  The encounter diagnosis was Primary malignant neoplasm of right lower lobe of lung (HCC).   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 She was last seen here for follow-up on 06/28/23.         Her most recent chest CT performed on 09/26/23 demonstrates a decrease in size of the RLL pulmonary nodule when compared to imaging performed on 02/09/23, now measuring 9 x 12 mm, and no new focal pulmonary nodules or infiltrates. (Other findings of potential clinical significance include extensive multi-vessel coronary artery calcifications, and stable fusiform enlargement of the ascending aorta measuring approximately 4.1 cm in the greatest extent.          She has also continued to follow with  pulmonary for her history of COPD w/ recurrent exacerbations and most recently met with NP Parrett on 07/31/23. She was noted to be doing relatively well at that time and denied any changes in her breathing. She has exertional SOB with heavy activities at baseline and was started on low-dose prednisone  at  her previous visit with pulmonology in December to help with her recurrent exacerbations. Given her stable respiratory status, she was instructed to continue on low-dose prednisone  (5 mg) at that time. Her inhaler regimen was kept that same (triple therapy maintenance inhaler).  She also continues to require 2L supplemental O2 (via [place]) at rest and 3L with activity.      No other significant interval history since the patient was last seen for follow-up.   ***         Allergies:  is allergic to pneumococcal vaccines, clarithromycin, estradiol, sulfa antibiotics, doxycycline  hyclate, pneumococcal polysaccharide vaccine, sulfacetamide sodium-sulfur, and sulfonamide derivatives.  Meds: Current Outpatient Medications  Medication Sig Dispense Refill   acetaminophen  (TYLENOL ) 500 MG tablet Take 500 mg by mouth every 6 (six) hours as needed for headache (pain).     albuterol  (PROVENTIL ) (2.5 MG/3ML) 0.083% nebulizer solution Take 3 mLs (2.5 mg total) by nebulization every 6 (six) hours as needed for wheezing or shortness of breath. 120 mL 6   albuterol  (VENTOLIN  HFA) 108 (90 Base) MCG/ACT inhaler Inhale 1-2 puffs into the lungs every 6 (six) hours as needed for wheezing or shortness of breath.     apixaban  (ELIQUIS ) 5 MG TABS tablet Take 1 tablet (5 mg total) by mouth 2 (two) times daily. 28 tablet 0   Budeson-Glycopyrrol-Formoterol  (BREZTRI  AEROSPHERE) 160-9-4.8 MCG/ACT AERO Inhale 2 puffs into the lungs in the morning and at bedtime. 10.7 g 2   Budeson-Glycopyrrol-Formoterol  (BREZTRI  AEROSPHERE) 160-9-4.8 MCG/ACT AERO Inhale 2 puffs into the lungs in the morning and at  bedtime.     Budeson-Glycopyrrol-Formoterol  (BREZTRI  AEROSPHERE) 160-9-4.8 MCG/ACT AERO Inhale 2 puffs into the lungs in the morning and at bedtime.     cefdinir  (OMNICEF ) 300 MG capsule Take 1 capsule (300 mg total) by mouth 2 (two) times daily. (Patient not taking: Reported on 04/05/2023) 14 capsule 0   cetirizine (ZYRTEC) 10 MG tablet  Take 10 mg by mouth daily.     escitalopram  (LEXAPRO ) 20 MG tablet Take 20 mg by mouth daily.     fluticasone  (FLONASE ) 50 MCG/ACT nasal spray Place 2 sprays into both nostrils daily. 9.9 mL 0   furosemide  (LASIX ) 20 MG tablet Take 20 mg by mouth daily as needed for fluid or edema. Takes when having swelling.     LORazepam  (ATIVAN ) 1 MG tablet Take 1 mg by mouth at bedtime.     omeprazole (PRILOSEC) 20 MG capsule Take 20 mg by mouth 2 (two) times daily before a meal.     predniSONE  (DELTASONE ) 5 MG tablet Take 1 tablet (5 mg total) by mouth daily with breakfast. 30 tablet 3   traZODone  (DESYREL ) 100 MG tablet Take 100 mg by mouth at bedtime.     triamterene -hydrochlorothiazide  (MAXZIDE -25) 37.5-25 MG tablet Take 0.5 tablets by mouth daily.     No current facility-administered medications for this encounter.    Physical Findings: The patient is in no acute distress. Patient is alert and oriented.  vitals were not taken for this visit. .  No significant changes. Lungs are clear to auscultation bilaterally. Heart has regular rate and rhythm. No palpable cervical, supraclavicular, or axillary adenopathy. Abdomen soft, non-tender, normal bowel sounds.   Lab Findings: Lab Results  Component Value Date   WBC 12.0 (H) 05/28/2023   HGB 12.0 05/28/2023   HCT 40.5 05/28/2023   MCV 85.4 05/28/2023   PLT 386 05/28/2023    Radiographic Findings: CT CHEST WO CONTRAST Result Date: 09/27/2023 CLINICAL DATA:  Non-small cell lung cancer, follow-up examination. * Tracking Code: BO * EXAM: CT CHEST WITHOUT CONTRAST TECHNIQUE: Multidetector CT imaging of the chest was performed following the standard protocol without IV contrast. RADIATION DOSE REDUCTION: This exam was performed according to the departmental dose-optimization program which includes automated exposure control, adjustment of the mA and/or kV according to patient size and/or use of iterative reconstruction technique. COMPARISON:  02/09/2023  FINDINGS: Cardiovascular: Extensive multi-vessel coronary artery calcification. Global cardiac size within normal limits. No pericardial effusion. Central pulmonary arteries are are stably enlarged in keeping with changes of pulmonary arterial hypertension. Mild atherosclerotic calcification within the thoracic aorta. Stable fusiform enlargement of the ascending aorta measuring 4.1 cm in greatest dimension. Descending thoracic aorta is of normal caliber. Mediastinum/Nodes: No enlarged mediastinal or axillary lymph nodes. Thyroid  gland, trachea, and esophagus demonstrate no significant findings. Lungs/Pleura: Spiculated nodule within the right lower lobe, axial image # 67/6 measures 9 x 12 mm, decreased in size since prior examination. No new focal pulmonary nodules or infiltrates. Severe emphysema. Stable parenchymal scarring within the right middle and lower lobes. No pneumothorax or pleural effusion. No central obstructing lesion. Upper Abdomen: No acute abnormality. No pathologic adenopathy within the visualized upper abdomen retroperitoneum. Adrenal glands are unremarkable. Musculoskeletal: No acute bone abnormality. No lytic or blastic bone lesion. Osseous structures are age appropriate. Bilateral breast implants again noted. IMPRESSION: 1. Spiculated right lower lobe pulmonary nodule corresponding the patient's known primary malignancy, decreased in size since prior examination of 02/09/2023. No new focal pulmonary nodules or infiltrates. 2. Extensive multi-vessel coronary artery  calcification. 3. Stable fusiform enlargement of the ascending aorta measuring 4.1 cm in greatest dimension. Recommend annual imaging followup by CTA or MRA. This recommendation follows 2010 ACCF/AHA/AATS/ACR/ASA/SCA/SCAI/SIR/STS/SVM Guidelines for the Diagnosis and Management of Patients with Thoracic Aortic Disease. Circulation. 2010; 121: Y782-N562. Aortic aneurysm NOS (ICD10-I71.9) 4. Enlarged central pulmonary arteries in keeping  with changes of pulmonary arterial hypertension. Aortic Atherosclerosis (ICD10-I70.0) and Emphysema (ICD10-J43.9). Electronically Signed   By: Worthy Heads M.D.   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  ***  Plan:  ***   *** minutes of total time was spent for this patient encounter, including preparation, face-to-face counseling with the patient and coordination of care, physical exam, and documentation of the encounter. ____________________________________  Noralee Beam, PhD, MD  This document serves as a record of services personally performed by Retta Caster, MD. It was created on his behalf by Lucky Sable, a trained medical scribe. The creation of this record is based on the scribe's personal observations and the provider's statements to them. This document has been checked and approved by the attending provider.

## 2023-10-04 ENCOUNTER — Ambulatory Visit
Admission: RE | Admit: 2023-10-04 | Discharge: 2023-10-04 | Disposition: A | Discharge status: Home / Self Care | Source: Ambulatory Visit | Attending: Radiation Oncology | Admitting: Radiation Oncology

## 2023-10-04 ENCOUNTER — Encounter: Payer: Self-pay | Admitting: Radiation Oncology

## 2023-10-04 VITALS — BP 122/83 | HR 80 | Temp 97.8°F | Resp 22

## 2023-10-04 DIAGNOSIS — I2510 Atherosclerotic heart disease of native coronary artery without angina pectoris: Secondary | ICD-10-CM | POA: Diagnosis not present

## 2023-10-04 DIAGNOSIS — I700 Atherosclerosis of aorta: Secondary | ICD-10-CM | POA: Insufficient documentation

## 2023-10-04 DIAGNOSIS — R0602 Shortness of breath: Secondary | ICD-10-CM | POA: Diagnosis not present

## 2023-10-04 DIAGNOSIS — Z79899 Other long term (current) drug therapy: Secondary | ICD-10-CM | POA: Diagnosis not present

## 2023-10-04 DIAGNOSIS — Z7901 Long term (current) use of anticoagulants: Secondary | ICD-10-CM | POA: Insufficient documentation

## 2023-10-04 DIAGNOSIS — I2721 Secondary pulmonary arterial hypertension: Secondary | ICD-10-CM | POA: Insufficient documentation

## 2023-10-04 DIAGNOSIS — Z87891 Personal history of nicotine dependence: Secondary | ICD-10-CM | POA: Diagnosis not present

## 2023-10-04 DIAGNOSIS — Z7952 Long term (current) use of systemic steroids: Secondary | ICD-10-CM | POA: Diagnosis not present

## 2023-10-04 DIAGNOSIS — J439 Emphysema, unspecified: Secondary | ICD-10-CM | POA: Insufficient documentation

## 2023-10-04 DIAGNOSIS — Z923 Personal history of irradiation: Secondary | ICD-10-CM | POA: Diagnosis not present

## 2023-10-04 DIAGNOSIS — C3431 Malignant neoplasm of lower lobe, right bronchus or lung: Secondary | ICD-10-CM | POA: Diagnosis not present

## 2023-10-04 DIAGNOSIS — J449 Chronic obstructive pulmonary disease, unspecified: Secondary | ICD-10-CM | POA: Diagnosis not present

## 2023-10-04 DIAGNOSIS — R911 Solitary pulmonary nodule: Secondary | ICD-10-CM | POA: Insufficient documentation

## 2023-10-04 NOTE — Progress Notes (Signed)
 [name] is here today for follow up post radiation to the lung and to get results from CT scan.  Lung Side: Right Last Treatment Date: 2023-05-21   Does the patient complain of any of the following: Pain:Yes, she reports flank pain. Shortness of breath w/wo exertion: Yes, she is on 2 liters of continuous O@ Cough: She reports coughing last week  Hemoptysis: She reports last week when coughing some blood came out Pain with swallowing: Denies Swallowing/choking concerns: Yes Appetite: Good Energy Level: Low Post radiation skin Changes: Denies   BP 122/83 (BP Location: Right Arm)   Pulse 80   Temp 97.8 F (36.6 C) (Oral)   Resp (!) 22   SpO2 93%

## 2023-10-11 ENCOUNTER — Ambulatory Visit (INDEPENDENT_AMBULATORY_CARE_PROVIDER_SITE_OTHER): Admitting: Adult Health

## 2023-10-11 ENCOUNTER — Encounter: Payer: Self-pay | Admitting: Adult Health

## 2023-10-11 VITALS — BP 151/77 | HR 75 | Temp 97.2°F | Ht 66.0 in | Wt 173.2 lb

## 2023-10-11 DIAGNOSIS — J449 Chronic obstructive pulmonary disease, unspecified: Secondary | ICD-10-CM

## 2023-10-11 DIAGNOSIS — I2699 Other pulmonary embolism without acute cor pulmonale: Secondary | ICD-10-CM

## 2023-10-11 DIAGNOSIS — J9611 Chronic respiratory failure with hypoxia: Secondary | ICD-10-CM | POA: Diagnosis not present

## 2023-10-11 DIAGNOSIS — C3431 Malignant neoplasm of lower lobe, right bronchus or lung: Secondary | ICD-10-CM | POA: Diagnosis not present

## 2023-10-11 MED ORDER — BREZTRI AEROSPHERE 160-9-4.8 MCG/ACT IN AERO: 2.0000 | INHALATION_SPRAY | Freq: Two times a day (BID) | RESPIRATORY_TRACT | 5 refills | Status: DC

## 2023-10-11 MED ORDER — PREDNISONE 5 MG PO TABS: 5.0000 mg | ORAL_TABLET | Freq: Every day | ORAL | 3 refills | Status: DC

## 2023-10-11 MED ORDER — OHTUVAYRE 3 MG/2.5ML IN SUSP: 1.0000 | Freq: Two times a day (BID) | RESPIRATORY_TRACT | Status: DC

## 2023-10-11 MED ORDER — BREZTRI AEROSPHERE 160-9-4.8 MCG/ACT IN AERO: 2.0000 | INHALATION_SPRAY | Freq: Two times a day (BID) | RESPIRATORY_TRACT | Status: DC

## 2023-10-11 MED ORDER — ALBUTEROL SULFATE (2.5 MG/3ML) 0.083% IN NEBU: 2.5000 mg | INHALATION_SOLUTION | Freq: Four times a day (QID) | RESPIRATORY_TRACT | 6 refills | Status: AC | PRN

## 2023-10-11 NOTE — Patient Instructions (Addendum)
 Continue on Prednisone  5mg  daily.  Continue Breztri  2 puffs Twice daily  , rinse after use  Begin [place]  neb Twice daily.  Albuterol  inhaler or neb As needed  Continue on Zyrtec 10mg  daily.  Continue on Omeprazole daily. Continue on Eliquis  Twice daily   Continue on Oxygen  2l/m, increase 3l/m with activity.  Activity as tolerated.  CT chest as planned per Radiation/Oncology Follow up in 3 months with Dr. Dione Franks or Montae Stager NP and As needed  -30 min slot  Please contact office for sooner follow up if symptoms do not improve or worsen or seek emergency care

## 2023-10-11 NOTE — Progress Notes (Unsigned)
 @Patient  ID: [name], female    DOB: 1940-10-28, 83 y.o.   MRN: [id number]  Chief Complaint  Patient presents with   Follow-up    Referring provider: Jhon Moselle, [name]  HPI: 83 year old female former smoker followed for severe COPD and chronic respiratory failure on oxygen .  History of lung cancer (non-small cell lung cancer diagnosed November 2024 status post XRT) History of right lung abscess, recurrent PE/DVT on anticoagulation therapy  TEST/EVENTS :  CT chest and November 17, 2021 showed emphysema no suspicious pulmonary nodules or masses. No consolidation.    02/13/22>> CT angio chest: Submassive PE, near complete resolution of RLL PNA. 02/14/22>> Echo: EF 70-75%, RV systolic function is severely reduced. 02/14/22>> bilateral lower extremity Doppler: DVT right femoral/popliteal/tibial/peroneal, left femoral vein   01/23/22>> CTA chest: New large alveolar infiltrate in the right lower lobe with cavitary lesion.  No PE. 01/25/22 >> barium esophagogram: No aspiration observed-no strictures/masses. 01/24/22 >> BAL cytology: No malignant cells.    CT chest September 26, 2022 showed resolved cavitary lesion in the right lower lobe.  Enlarged right lower lobe nodule measuring 1.3 cm previously 1.  0 cm.  Bilateral breast implants with intracapsular rupture, emphysema, small subsegmental emboli in the right lower lobe and right middle lobe.  ? chronic.   CT chest February 09, 2023 spiculated right lower lobe nodule slight growth from April 2024 worrisome for bronchogenic carcinoma.   PET scan February 28, 2023 hypermetabolic 15 mm right lower lobe nodule, no hypermetabolic adenopathy.  No findings for pulmonary metastatic disease.   Venous Doppler February 14, 2022 acute DVT right femoral vein, right popliteal vein, right popliteal tibial veins and right peroneal veins, acute DVT involving the left femoral vein   Venous Doppler 09/26/22 negative DVT right lower extremity, DVT left  popliteal vein age-indeterminate, chronic deep venous thrombosis involving the left femoral vein    2D echo February 14, 2022 EF 70 to 75%, grade 1 diastolic dysfunction, right ventricular systolic function severely reduced   Echo November 10, 2022 EF 60 to 65%, grade 1 diastolic dysfunction, RV SF normal,, RV size normal, normal pulmonary artery systolic pressure.   PFT November 16, 2022 showed  severe COPD with FEV1 at 29%, ratio 38, FVC 59%, positive bronchodilator response with FEV1 postbronchodilator at 33%, ratio 36, FVC 69%, DLCO 36%   04/09/23 Nav Bronch- RLL lesion -NSCLC- carcinoma is positive with TTF-1 and negative with p40 and cytokeratin 5/6 consistent with lung adenocarcinoma.    May 14, 2023 MRI brain negative for mets.  10/11/2023 Follow up ; COPD, O2 RF , Lung cancer  Discussed the use of AI scribe software for clinical note transcription with the patient, who gave verbal consent to proceed.  History of Present Illness   [name] is an 83 year old female with COPD and lung cancer who presents for a two-month follow-up.  Patient has severe COPD.  She remains on Breztri  twice daily.  Chronic steroids with prednisone  5 mg daily due to recurrent exacerbations.  She says overall she is doing some better since last visit. She denies any flare of cough or wheezing.  Continues to get short of breath with minimal activities.  As above she has a history of lung cancer status post radiation therapy.  Serial CT follow-up September 26, 2023 showed decrease in right lower lobe nodule.  No new pulmonary nodules noted.  Severe emphysema.  Noted diffuse atherosclerosis coronary arteries.  She is followed by radiation oncology  to follow-up CT in 6 months.  Also has been referred to cardiology for evaluation of atherosclerosis.      Allergies  Allergen Reactions   Pneumococcal Vaccines Shortness Of Breath and Other (See Comments)    Couldn't breathe    Clarithromycin Swelling   Estradiol  Rash    Reaction to cream   Sulfa Antibiotics Nausea And Vomiting and Swelling   Doxycycline  Hyclate Other (See Comments)    stomach distress   Pneumococcal Polysaccharide Vaccine Other (See Comments)    Unknown   Sulfacetamide Sodium-Sulfur Other (See Comments)   Sulfonamide Derivatives Swelling    Immunization History  Administered Date(s) Administered   Fluad Quad(high Dose 65+) 01/10/2021   Influenza Split 02/27/2011, 02/27/2012, 02/26/2013, 02/26/2014, 02/27/2016, 03/25/2017   Influenza Whole 02/07/2009   Influenza, High Dose Seasonal PF 03/17/2014, 02/27/2017, 03/27/2018   Influenza, Quadrivalent, Recombinant, Inj, Pf 02/25/2018, 04/08/2019, 03/05/2020, 02/11/2021   Influenza,inj,Quad PF,6+ Mos 03/29/2022   Influenza-Unspecified 01/27/2013, 03/05/2015, 03/05/2020   PFIZER(Purple Top)SARS-COV-2 Vaccination 07/12/2019, 08/06/2019, 10/06/2019   Tdap 10/07/2013, 04/23/2022    Past Medical History:  Diagnosis Date   Allergic rhinitis    Anemia due to GI blood loss    Anxiety    Asthma    Chronic airway obstruction, not elsewhere classified    Depressive disorder, not elsewhere classified    DVT (deep venous thrombosis) (HCC)    LLE DVT 01/13/19   Dyspnea    Esophageal reflux    Essential hypertension 08/27/2019   History of radiation therapy    Right lung-05/15/23-05/21/23- Dr. Retta Caster   PE (pulmonary thromboembolism) (HCC) 01/29/2019   Pneumonia    Wears partial dentures     Tobacco History: Social History   Tobacco Use  Smoking Status Former   Current packs/day: 0.00   Average packs/day: 0.3 packs/day for 40.0 years (10.0 ttl pk-yrs)   Types: Cigarettes   Start date: 78   Quit date: 2000   Years since quitting: 25.3  Smokeless Tobacco Never   Counseling given: Not Answered   Outpatient Medications Prior to Visit  Medication Sig Dispense Refill   acetaminophen  (TYLENOL ) 500 MG tablet Take 500 mg by mouth every 6 (six) hours as needed for headache  (pain).     albuterol  (PROVENTIL ) (2.5 MG/3ML) 0.083% nebulizer solution Take 3 mLs (2.5 mg total) by nebulization every 6 (six) hours as needed for wheezing or shortness of breath. 120 mL 6   apixaban  (ELIQUIS ) 5 MG TABS tablet Take 1 tablet (5 mg total) by mouth 2 (two) times daily. 28 tablet 0   Budeson-Glycopyrrol-Formoterol  (BREZTRI  AEROSPHERE) 160-9-4.8 MCG/ACT AERO Inhale 2 puffs into the lungs in the morning and at bedtime.     cetirizine (ZYRTEC) 10 MG tablet Take 10 mg by mouth daily.     escitalopram  (LEXAPRO ) 20 MG tablet Take 20 mg by mouth daily.     fluticasone  (FLONASE ) 50 MCG/ACT nasal spray Place 2 sprays into both nostrils daily. 9.9 mL 0   furosemide  (LASIX ) 20 MG tablet Take 20 mg by mouth daily as needed for fluid or edema. Takes when having swelling.     LORazepam  (ATIVAN ) 1 MG tablet Take 1 mg by mouth at bedtime.     omeprazole (PRILOSEC) 20 MG capsule Take 20 mg by mouth 2 (two) times daily before a meal.     predniSONE  (DELTASONE ) 5 MG tablet Take 1 tablet (5 mg total) by mouth daily with breakfast. 30 tablet 3   traZODone  (DESYREL ) 100 MG tablet Take  100 mg by mouth at bedtime.     triamterene -hydrochlorothiazide  (MAXZIDE -25) 37.5-25 MG tablet Take 0.5 tablets by mouth daily.     albuterol  (VENTOLIN  HFA) 108 (90 Base) MCG/ACT inhaler Inhale 1-2 puffs into the lungs every 6 (six) hours as needed for wheezing or shortness of breath.     No facility-administered medications prior to visit.     Review of Systems:   Constitutional:   No  weight loss, night sweats,  Fevers, chills, +fatigue, or  lassitude.  HEENT:   No headaches,  Difficulty swallowing,  Tooth/dental problems, or  Sore throat,                No sneezing, itching, ear ache, nasal congestion, post nasal drip,   CV:  No chest pain,  Orthopnea, PND, swelling in lower extremities, anasarca, dizziness, palpitations, syncope.   GI  No heartburn, indigestion, abdominal pain, nausea, vomiting, diarrhea,  change in bowel habits, loss of appetite, bloody stools.   Resp:   No chest wall deformity  Skin: no rash or lesions.  GU: no dysuria, change in color of urine, no urgency or frequency.  No flank pain, no hematuria   MS:  No joint pain or swelling.  No decreased range of motion.  No back pain.    Physical Exam  BP (!) 151/77 (BP Location: Left Arm, Patient Position: Sitting, Cuff Size: Large)   Pulse 75   Temp (!) 97.2 F (36.2 C) (Temporal)   Ht 5\' 6"  (1.676 m)   Wt 173 lb 3.2 oz (78.6 kg)   SpO2 90% Comment: Using 2 L of oxygen   BMI 27.96 kg/m   GEN: A/Ox3; pleasant , NAD, well nourished    HEENT:  [place]/AT,  NOSE-clear, THROAT-clear, no lesions, no postnasal drip or exudate noted.   NECK:  Supple w/ fair ROM; no JVD; normal carotid impulses w/o bruits; no thyromegaly or nodules palpated; no lymphadenopathy.    RESP  Clear  P & A; w/o, wheezes/ rales/ or rhonchi. no accessory muscle use, no dullness to percussion  CARD:  RRR, no m/r/g, no peripheral edema, pulses intact, no cyanosis or clubbing.  GI:   Soft & nt; nml bowel sounds; no organomegaly or masses detected.   Musco: Warm bil, no deformities or joint swelling noted.   Neuro: alert, no focal deficits noted.    Skin: Warm, no lesions or rashes    Lab Results:  CBC     BNP   ProBNP No results found for: "PROBNP"  Imaging: CT CHEST WO CONTRAST Result Date: 09/27/2023 CLINICAL DATA:  Non-small cell lung cancer, follow-up examination. * Tracking Code: BO * EXAM: CT CHEST WITHOUT CONTRAST TECHNIQUE: Multidetector CT imaging of the chest was performed following the standard protocol without IV contrast. RADIATION DOSE REDUCTION: This exam was performed according to the departmental dose-optimization program which includes automated exposure control, adjustment of the mA and/or kV according to patient size and/or use of iterative reconstruction technique. COMPARISON:  02/09/2023 FINDINGS: Cardiovascular:  Extensive multi-vessel coronary artery calcification. Global cardiac size within normal limits. No pericardial effusion. Central pulmonary arteries are are stably enlarged in keeping with changes of pulmonary arterial hypertension. Mild atherosclerotic calcification within the thoracic aorta. Stable fusiform enlargement of the ascending aorta measuring 4.1 cm in greatest dimension. Descending thoracic aorta is of normal caliber. Mediastinum/Nodes: No enlarged mediastinal or axillary lymph nodes. Thyroid  gland, trachea, and esophagus demonstrate no significant findings. Lungs/Pleura: Spiculated nodule within the right lower lobe, axial image # 67/6  measures 9 x 12 mm, decreased in size since prior examination. No new focal pulmonary nodules or infiltrates. Severe emphysema. Stable parenchymal scarring within the right middle and lower lobes. No pneumothorax or pleural effusion. No central obstructing lesion. Upper Abdomen: No acute abnormality. No pathologic adenopathy within the visualized upper abdomen retroperitoneum. Adrenal glands are unremarkable. Musculoskeletal: No acute bone abnormality. No lytic or blastic bone lesion. Osseous structures are age appropriate. Bilateral breast implants again noted. IMPRESSION: 1. Spiculated right lower lobe pulmonary nodule corresponding the patient's known primary malignancy, decreased in size since prior examination of 02/09/2023. No new focal pulmonary nodules or infiltrates. 2. Extensive multi-vessel coronary artery calcification. 3. Stable fusiform enlargement of the ascending aorta measuring 4.1 cm in greatest dimension. Recommend annual imaging followup by CTA or MRA. This recommendation follows 2010 ACCF/AHA/AATS/ACR/ASA/SCA/SCAI/SIR/STS/SVM Guidelines for the Diagnosis and Management of Patients with Thoracic Aortic Disease. Circulation. 2010; 121: A355-D322. Aortic aneurysm NOS (ICD10-I71.9) 4. Enlarged central pulmonary arteries in keeping with changes of pulmonary  arterial hypertension. Aortic Atherosclerosis (ICD10-I70.0) and Emphysema (ICD10-J43.9). Electronically Signed   By: Worthy Heads M.D.   On: 09/27/2023 21:08    Administration History     None          Latest Ref Rng & Units 11/15/2022    8:23 AM  PFT Results  FVC-Pre L 1.72   FVC-Predicted Pre % 59   FVC-Post L 2.01   FVC-Predicted Post % 69   Pre FEV1/FVC % % 38   Post FEV1/FCV % % 36   FEV1-Pre L 0.65   FEV1-Predicted Pre % 29   FEV1-Post L 0.72   DLCO uncorrected ml/min/mmHg 7.38   DLCO UNC% % 36   DLCO corrected ml/min/mmHg 7.31   DLCO COR %Predicted % 36   DLVA Predicted % 49   TLC L 6.16   TLC % Predicted % 115   RV % Predicted % 171     No results found for: "NITRICOXIDE"      Assessment & Plan:    Assessment and Plan    Chronic Obstructive Pulmonary Disease (COPD)   Maintained on maximum therapy with Breztri  twice daily.  Requiring daily steroids due to recurrent exacerbations.  Will add in Ohtuvayre  nebulizer twice daily..  Will return visit if doing well consider tapering slowly off of prednisone .  Non-small cell lung cancer right lower lobe-diagnosed November 2024.  Status post XRT.  Surveillance CT chest September 26, 2023 shows decrease in right lower lobe nodule with no new nodules noted.  Continue follow-up with radiation oncology and plan CT chest in 6 months.  History of PE.  Continue on Eliquis .    Notable atherosclerosis on CT scan.  Cardiology referral is pending   Plan  Patient Instructions  Continue on Prednisone  5mg  daily.  Continue Breztri  2 puffs Twice daily  , rinse after use  Begin Ohtuvayre  neb Twice daily.  Albuterol  inhaler or neb As needed  Continue on Zyrtec 10mg  daily.  Continue on Omeprazole daily. Continue on Eliquis  Twice daily   Continue on Oxygen  2l/m, increase 3l/m with activity.  Activity as tolerated.  CT chest as planned per Radiation/Oncology Follow up in 3 months with Dr. Dione Franks or Shamecca Whitebread NP and As needed  -30  min slot  Please contact office for sooner follow up if symptoms do not improve or worsen or seek emergency care     Roena Clark, NP 10/11/2023

## 2023-10-15 NOTE — Telephone Encounter (Signed)
 Please call patient, if she is coughing up blood will need ov. CT chest 09/26/23 was neg for acute process.  Please contact office for sooner follow up if symptoms do not improve or worsen or seek emergency care

## 2023-10-17 ENCOUNTER — Emergency Department (HOSPITAL_BASED_OUTPATIENT_CLINIC_OR_DEPARTMENT_OTHER)
Admission: EM | Admit: 2023-10-17 | Discharge: 2023-10-17 | Disposition: A | Discharge status: Home / Self Care | Attending: Emergency Medicine | Admitting: Emergency Medicine

## 2023-10-17 ENCOUNTER — Encounter (HOSPITAL_BASED_OUTPATIENT_CLINIC_OR_DEPARTMENT_OTHER): Payer: Self-pay | Admitting: Emergency Medicine

## 2023-10-17 ENCOUNTER — Other Ambulatory Visit: Payer: Self-pay

## 2023-10-17 ENCOUNTER — Ambulatory Visit: Payer: Self-pay

## 2023-10-17 ENCOUNTER — Emergency Department (HOSPITAL_BASED_OUTPATIENT_CLINIC_OR_DEPARTMENT_OTHER)

## 2023-10-17 DIAGNOSIS — R911 Solitary pulmonary nodule: Secondary | ICD-10-CM | POA: Diagnosis not present

## 2023-10-17 DIAGNOSIS — Z7951 Long term (current) use of inhaled steroids: Secondary | ICD-10-CM | POA: Insufficient documentation

## 2023-10-17 DIAGNOSIS — R059 Cough, unspecified: Secondary | ICD-10-CM | POA: Diagnosis not present

## 2023-10-17 DIAGNOSIS — I7121 Aneurysm of the ascending aorta, without rupture: Secondary | ICD-10-CM | POA: Diagnosis not present

## 2023-10-17 DIAGNOSIS — Z85118 Personal history of other malignant neoplasm of bronchus and lung: Secondary | ICD-10-CM | POA: Insufficient documentation

## 2023-10-17 DIAGNOSIS — Z7952 Long term (current) use of systemic steroids: Secondary | ICD-10-CM | POA: Insufficient documentation

## 2023-10-17 DIAGNOSIS — R042 Hemoptysis: Secondary | ICD-10-CM | POA: Insufficient documentation

## 2023-10-17 DIAGNOSIS — R0602 Shortness of breath: Secondary | ICD-10-CM | POA: Diagnosis not present

## 2023-10-17 DIAGNOSIS — J449 Chronic obstructive pulmonary disease, unspecified: Secondary | ICD-10-CM | POA: Diagnosis not present

## 2023-10-17 DIAGNOSIS — Z7901 Long term (current) use of anticoagulants: Secondary | ICD-10-CM | POA: Insufficient documentation

## 2023-10-17 DIAGNOSIS — J45909 Unspecified asthma, uncomplicated: Secondary | ICD-10-CM | POA: Diagnosis not present

## 2023-10-17 DIAGNOSIS — K920 Hematemesis: Secondary | ICD-10-CM | POA: Diagnosis not present

## 2023-10-17 DIAGNOSIS — R0789 Other chest pain: Secondary | ICD-10-CM | POA: Insufficient documentation

## 2023-10-17 LAB — BASIC METABOLIC PANEL WITH GFR
Anion gap: 10 (ref 5–15)
BUN: 23 mg/dL (ref 8–23)
CO2: 30 mmol/L (ref 22–32)
Calcium: 9.4 mg/dL (ref 8.9–10.3)
Chloride: 101 mmol/L (ref 98–111)
Creatinine, Ser: 0.84 mg/dL (ref 0.44–1.00)
GFR, Estimated: >60 mL/min (ref >60)
Glucose, Bld: 116 mg/dL — ABNORMAL HIGH (ref 70–99)
Potassium: 3.8 mmol/L (ref 3.5–5.1)
Sodium: 141 mmol/L (ref 135–145)

## 2023-10-17 LAB — TROPONIN T, HIGH SENSITIVITY
Troponin T High Sensitivity: 29 ng/L — ABNORMAL HIGH (ref <19)
Troponin T High Sensitivity: 26 ng/L — ABNORMAL HIGH (ref <19)

## 2023-10-17 LAB — CBC
HCT: 36 % (ref 36.0–46.0)
Hemoglobin: 10.5 g/dL — ABNORMAL LOW (ref 12.0–15.0)
MCH: 24.1 pg — ABNORMAL LOW (ref 26.0–34.0)
MCHC: 29.2 g/dL — ABNORMAL LOW (ref 30.0–36.0)
MCV: 82.8 fL (ref 80.0–100.0)
Platelets: 348 10*3/uL (ref 150–400)
RBC: 4.35 MIL/uL (ref 3.87–5.11)
RDW: 17 % — ABNORMAL HIGH (ref 11.5–15.5)
WBC: 7.8 10*3/uL (ref 4.0–10.5)
nRBC: 0 % (ref 0.0–0.2)

## 2023-10-17 LAB — RESP PANEL BY RT-PCR (RSV, FLU A&B, COVID)  RVPGX2
Influenza A by PCR: NEGATIVE
Influenza B by PCR: NEGATIVE
Resp Syncytial Virus by PCR: NEGATIVE
SARS Coronavirus 2 by RT PCR: NEGATIVE

## 2023-10-17 LAB — HEPATIC FUNCTION PANEL
ALT: 14 U/L (ref 0–44)
AST: 24 U/L (ref 15–41)
Albumin: 4.3 g/dL (ref 3.5–5.0)
Alkaline Phosphatase: 64 U/L (ref 38–126)
Bilirubin, Direct: 0.2 mg/dL (ref 0.0–0.2)
Indirect Bilirubin: 0.3 mg/dL (ref 0.3–0.9)
Total Bilirubin: 0.6 mg/dL (ref 0.0–1.2)
Total Protein: 6.6 g/dL (ref 6.5–8.1)

## 2023-10-17 LAB — LIPASE, BLOOD: Lipase: 62 U/L — ABNORMAL HIGH (ref 11–51)

## 2023-10-17 LAB — PRO BRAIN NATRIURETIC PEPTIDE: Pro Brain Natriuretic Peptide: 67.2 pg/mL (ref <300.0)

## 2023-10-17 MED ORDER — ALBUTEROL SULFATE (2.5 MG/3ML) 0.083% IN NEBU
2.5000 mg | INHALATION_SOLUTION | Freq: Once | RESPIRATORY_TRACT | Status: AC
  Administered 2023-10-17: 2.5 mg via RESPIRATORY_TRACT
  Filled 2023-10-17: qty 3

## 2023-10-17 MED ORDER — IPRATROPIUM-ALBUTEROL 0.5-2.5 (3) MG/3ML IN SOLN
3.0000 mL | Freq: Once | RESPIRATORY_TRACT | Status: AC
Start: 2023-10-17 — End: 2023-10-17
  Administered 2023-10-17: 3 mL via RESPIRATORY_TRACT
  Filled 2023-10-17: qty 3

## 2023-10-17 MED ORDER — IOHEXOL 350 MG/ML SOLN
100.0000 mL | Freq: Once | INTRAVENOUS | Status: AC | PRN
  Administered 2023-10-17: 75 mL via INTRAVENOUS

## 2023-10-17 NOTE — Discharge Instructions (Signed)
 1.  Call your pulmonologist's office tomorrow morning.  You need to be seen in follow-up as soon as possible.  You will need to get a bronchoscopy.  You may have a tumor or ulceration or superficial blood vessels in the lung that are bleeding intermittently. 2.  Your CT scan does not show evidence of a blood clot in the lungs.  Your lung cancer as seen on prior images is stable.  However, with your history of lung cancer and now, intermittently coughing up blood tinged mucus, you must be seen by your pulmonologist and oncologist as soon as possible. 3.  If you start coughing up clots or red blood, return to the emergency department immediately for recheck.

## 2023-10-17 NOTE — ED Triage Notes (Signed)
 C/o "spitting up blood" x 3 days. Hx of lung cancer. Normally wears 2L, requiring 4L.

## 2023-10-17 NOTE — ED Provider Notes (Signed)
 [place] EMERGENCY DEPARTMENT AT [facility] Provider Note   CSN: [id number] Arrival date & time: 10/17/23  1010     History  Chief Complaint  Patient presents with   Hematemesis   Shortness of Breath    [name] is a 83 y.o. female.  The history is provided by the patient and medical records. No language interpreter was used.  Shortness of Breath Severity:  Moderate Onset quality:  Gradual Duration:  3 days Timing:  Constant Progression:  Waxing and waning Chronicity:  New Context: URI (mild cough)   Relieved by:  Nothing Worsened by:  Nothing Ineffective treatments:  None tried Associated symptoms: chest pain (tightness), cough, hemoptysis and sputum production   Associated symptoms: no abdominal pain, no fever, no headaches, no neck pain, no rash, no syncope, no vomiting and no wheezing   Risk factors: hx of cancer and hx of PE/DVT        Home Medications Prior to Admission medications   Medication Sig Start Date End Date Taking? Authorizing Provider  acetaminophen  (TYLENOL ) 500 MG tablet Take 500 mg by mouth every 6 (six) hours as needed for headache (pain).    [provider]  albuterol  (PROVENTIL ) (2.5 MG/3ML) 0.083% nebulizer solution Take 3 mLs (2.5 mg total) by nebulization every 6 (six) hours as needed for wheezing or shortness of breath. 10/11/23   Parrett, Macdonald Savoy, NP  albuterol  (VENTOLIN  HFA) 108 (90 Base) MCG/ACT inhaler Inhale 1-2 puffs into the lungs every 6 (six) hours as needed for wheezing or shortness of breath.    [provider]  apixaban  (ELIQUIS ) 5 MG TABS tablet Take 1 tablet (5 mg total) by mouth 2 (two) times daily. 05/12/23   Icard, Lucie Ruts, DO  budesonide -glycopyrrolate -formoterol  (BREZTRI  AEROSPHERE) 160-9-4.8 MCG/ACT AERO inhaler Inhale 2 puffs into the lungs in the morning and at bedtime. 10/11/23   Parrett, Macdonald Savoy, NP  budesonide -glycopyrrolate -formoterol  (BREZTRI  AEROSPHERE) 160-9-4.8 MCG/ACT AERO  inhaler Inhale 2 puffs into the lungs in the morning and at bedtime. 10/11/23   Parrett, Macdonald Savoy, NP  cetirizine (ZYRTEC) 10 MG tablet Take 10 mg by mouth daily.    [provider]  Ensifentrine  (OHTUVAYRE ) 3 MG/2.5ML SUSP Inhale 1 Inhalation into the lungs in the morning and at bedtime. 10/11/23   Parrett, Macdonald Savoy, NP  escitalopram  (LEXAPRO ) 20 MG tablet Take 20 mg by mouth daily.    [provider]  fluticasone  (FLONASE ) 50 MCG/ACT nasal spray Place 2 sprays into both nostrils daily. 09/29/22   Audria Leather, MD  furosemide  (LASIX ) 20 MG tablet Take 20 mg by mouth daily as needed for fluid or edema. Takes when having swelling.    [provider]  LORazepam  (ATIVAN ) 1 MG tablet Take 1 mg by mouth at bedtime.    [provider]  omeprazole (PRILOSEC) 20 MG capsule Take 20 mg by mouth 2 (two) times daily before a meal. 10/19/22   [provider]  predniSONE  (DELTASONE ) 5 MG tablet Take 1 tablet (5 mg total) by mouth daily with breakfast. 10/11/23   Parrett, Macdonald Savoy, NP  traZODone  (DESYREL ) 100 MG tablet Take 100 mg by mouth at bedtime.    [provider]  triamterene -hydrochlorothiazide  (MAXZIDE -25) 37.5-25 MG tablet Take 0.5 tablets by mouth daily. 02/06/19   [provider]      Allergies    Pneumococcal vaccines, Clarithromycin, Estradiol, Sulfa antibiotics, Doxycycline  hyclate, Pneumococcal polysaccharide vaccine, Sulfacetamide sodium-sulfur, and Sulfonamide derivatives    Review of Systems  Review of Systems  Constitutional:  Positive for fatigue. Negative for chills and fever.  HENT:  Negative for congestion.   Eyes:  Negative for visual disturbance.  Respiratory:  Positive for cough, hemoptysis, sputum production, chest tightness and shortness of breath. Negative for wheezing and stridor.   Cardiovascular:  Positive for chest pain (tightness). Negative for palpitations, leg swelling and syncope.  Gastrointestinal:  Negative for  abdominal pain, constipation, diarrhea, nausea and vomiting.  Genitourinary:  Negative for dysuria.  Musculoskeletal:  Negative for back pain, neck pain and neck stiffness.  Skin:  Negative for rash and wound.  Neurological:  Negative for headaches.  Psychiatric/Behavioral:  Negative for agitation.   All other systems reviewed and are negative.   Physical Exam Updated Vital Signs BP (!) 158/86 (BP Location: Left Arm)   Pulse 86   Temp 98.2 F (36.8 C) (Oral)   Resp (!) 22   SpO2 93%  Physical Exam Vitals and nursing note reviewed.  Constitutional:      General: She is not in acute distress.    Appearance: She is well-developed. She is not ill-appearing, toxic-appearing or diaphoretic.  HENT:     Head: Normocephalic and atraumatic.     Mouth/Throat:     Mouth: Mucous membranes are moist.  Eyes:     Conjunctiva/sclera: Conjunctivae normal.     Pupils: Pupils are equal, round, and reactive to light.  Cardiovascular:     Rate and Rhythm: Normal rate and regular rhythm.     Heart sounds: No murmur heard. Pulmonary:     Effort: Pulmonary effort is normal. Tachypnea present. No respiratory distress.     Breath sounds: Rhonchi and rales present. No wheezing.  Chest:     Chest wall: No tenderness.  Abdominal:     Palpations: Abdomen is soft.     Tenderness: There is no abdominal tenderness.  Musculoskeletal:        General: No swelling.     Cervical back: Neck supple.     Right lower leg: No tenderness. No edema.     Left lower leg: No tenderness. No edema.  Skin:    General: Skin is warm and dry.     Capillary Refill: Capillary refill takes less than 2 seconds.     Findings: No erythema.  Neurological:     General: No focal deficit present.     Mental Status: She is alert.  Psychiatric:        Mood and Affect: Mood normal.     ED Results / Procedures / Treatments   Labs (all labs ordered are listed, but only abnormal results are displayed) Labs Reviewed  BASIC  METABOLIC PANEL WITH GFR - Abnormal; Notable for the following components:      Result Value   Glucose, Bld 116 (*)    All other components within normal limits  CBC - Abnormal; Notable for the following components:   Hemoglobin 10.5 (*)    MCH 24.1 (*)    MCHC 29.2 (*)    RDW 17.0 (*)    All other components within normal limits  LIPASE, BLOOD - Abnormal; Notable for the following components:   Lipase 62 (*)    All other components within normal limits  TROPONIN T, HIGH SENSITIVITY - Abnormal; Notable for the following components:   Troponin T High Sensitivity 29 (*)    All other components within normal limits  TROPONIN T, HIGH SENSITIVITY - Abnormal; Notable for the following components:   Troponin T  High Sensitivity 26 (*)    All other components within normal limits  RESP PANEL BY RT-PCR (RSV, FLU A&B, COVID)  RVPGX2  PRO BRAIN NATRIURETIC PEPTIDE  HEPATIC FUNCTION PANEL    EKG EKG Interpretation Date/Time:  Wednesday Oct 17 2023 10:33:06 EDT Ventricular Rate:  84 PR Interval:  163 QRS Duration:  86 QT Interval:  412 QTC Calculation: 487 R Axis:   73  Text Interpretation: Sinus rhythm Minimal ST elevation, lateral leads Borderline prolonged QT interval when compared to prior, similar appearance No STEMI Confirmed by Wynell Heath ([id number]) on 10/17/2023 11:09:55 AM  Radiology No results found.  Procedures Procedures    Medications Ordered in ED Medications  ipratropium-albuterol  (DUONEB) 0.5-2.5 (3) MG/3ML nebulizer solution 3 mL (3 mLs Nebulization Given 10/17/23 1036)  albuterol  (PROVENTIL ) (2.5 MG/3ML) 0.083% nebulizer solution 2.5 mg (2.5 mg Nebulization Given 10/17/23 1036)  iohexol  (OMNIPAQUE ) 350 MG/ML injection 100 mL (75 mLs Intravenous Contrast Given 10/17/23 1301)    ED Course/ Medical Decision Making/ A&P                                 Medical Decision Making Amount and/or Complexity of Data Reviewed Labs: ordered. Radiology:  ordered.  Risk Prescription drug management.    TENA LINEBAUGH is a 83 y.o. female with a past medical history significant for COPD, lung cancer, pulmonary embolism with previous IVC filter status post removal and chronic Eliquis  use, asthma, reflux, pulmonary abscess, and anxiety who presents with shortness of breath, cough, and Tim[place] this.  According to patient, for the last several days she has had worsened productive cough and is having "lots of blood" coming up with coughing.  She denies any nausea or vomiting and denies abdominal pain.  She reports chest tightness across her chest.  She denies any trauma.  She reports no fevers or chills but does feel tired.  She reports no she denies any leg pain or leg swelling.  She reports some constipation but denies diarrhea.  Does not report any blood in her stools.  On exam, lungs have coarseness and rales.  No wheezing.  Chest nontender.  Abdomen nontender.  Patient is not tachycardic but is tachypneic.  Oxygen  saturations are in the low 90s on 3 L.  She normally takes 2 L so this is slightly increased.  Legs are nontender nonedematous.  Patient otherwise resting.  EKG does not show STEMI.  Given the patient's worsens shortness of breath, chest tightness, 3 days of hemoptysis, and history, I am concerned about recurrent pulmonary embolism versus pneumonia versus other problems.  Will get CT imaging, labs, and will reassess the patient.  Given the increased oxygen  requirement and needed 4 L upon arrival, patient may require admission.  Anticipate reassessment after workup to determine disposition.  Care transferred to oncoming team to wait for results of CT PE study.  If it is reassuring, dissipate discharge home but if CT is concerning, anticipate reassessment to determine disposition.         Final Clinical Impression(s) / ED Diagnoses Final diagnoses:  Hemoptysis    Clinical Impression: 1. Hemoptysis     Disposition: Care  transferred oncoming team to await reassessment of oxygen  requirement and how she is breathing after CT imaging completes.  This note was prepared with assistance of Conservation officer, historic buildings. Occasional wrong-word or sound-a-like substitutions may have occurred due to the inherent limitations of voice recognition software.  Shaneece Stockburger, Marine Sia, MD 10/17/23 1536

## 2023-10-17 NOTE — Telephone Encounter (Signed)
 Pt notes she "spit up about half a cup of blood" on Sunday and notes while the quantity has reduced she continues to spit up blood, reports mild CP, increased SOB. Pt advised to proceed to ED for eval/treat, pt agreeable. This RN educated pt on home care, new-worsening symptoms, when to call back/seek emergent care. Pt verbalized understanding and agrees to plan.     E2C2 Pulmonary Triage - Initial Assessment Questions "Chief Complaint (e.g., cough, sob, wheezing, fever, chills, sweat or additional symptoms) *Go to specific symptom protocol after initial questions. Spitting up blood  "How long have symptoms been present?" Began Sunday  Have you tested for COVID or Flu? Note: If not, ask patient if a home test can be taken. If so, instruct patient to call back for positive results. No  MEDICINES:   "Have you used any OTC meds to help with symptoms?" No If yes, ask "What medications?" NA  "Have you used your inhalers/maintenance medication?" Yes If yes, "What medications?" As prescribed  If inhaler, ask "How many puffs and how often?" Note: Review instructions on medication in the chart. As prescribed  OXYGEN : "Do you wear supplemental oxygen ?" Yes If yes, "How many liters are you supposed to use?" 2 LPM, pt has it increased to 2.5 LPM  "Do you monitor your oxygen  levels?" Yes If yes, "What is your reading (oxygen  level) today?" 92%  "What is your usual oxygen  saturation reading?"  (Note: Pulmonary O2 sats should be 90% or greater) "It can get as low as 80%"   Copied from CRM [id number]. Topic: Clinical - Red Word Triage >> Oct 17, 2023  7:58 AM Crist Dominion wrote: Red Word that prompted transfer to Nurse Triage: Spitting up blood since Sunday AM. Reason for Disposition  History of prior "blood clot" in leg or lungs (i.e., deep vein thrombosis, pulmonary embolism)  Answer Assessment - Initial Assessment Questions 1. ONSET: "When did the cough begin?"      Sunday AM 3.  SPUTUM: "Describe the color of your sputum" (none, dry cough; clear, white, yellow, green)     No other colors 4. HEMOPTYSIS: "How much blood?" (flecks, streaks, tablespoons, etc.)     "About half a cup Sunday" amount has reduced since Sunday 5. DIFFICULTY BREATHING: "Are you having difficulty breathing?" If Yes, ask: "How bad is it?" (e.g., mild, moderate, severe)    - MILD: No SOB at rest, mild SOB with walking, speaks normally in sentences, can lie down, no retractions, pulse < 100.    - MODERATE: SOB at rest, SOB with minimal exertion and prefers to sit, cannot lie down flat, speaks in phrases, mild retractions, audible wheezing, pulse 100-120.    - SEVERE: Very SOB at rest, speaks in single words, struggling to breathe, sitting hunched forward, retractions, pulse > 120      "A little worse than normal" 6. FEVER: "Do you have a fever?" If Yes, ask: "What is your temperature, how was it measured, and when did it start?"     None 7. CARDIAC HISTORY: "Do you have any history of heart disease?" (e.g., heart attack, congestive heart failure)      None 8. LUNG HISTORY: "Do you have any history of lung disease?"  (e.g., pulmonary embolus, asthma, emphysema)     COPD 9. PE RISK FACTORS: "Do you have a history of blood clots?" (or: recent major surgery, recent prolonged travel, bedridden)     Previous DVT 10. OTHER SYMPTOMS: "Do you have any other symptoms?" (e.g., runny  nose, wheezing, chest pain)       Wheezing, "little bit of CP"  Protocols used: Coughing Up Blood-A-AH

## 2023-10-17 NOTE — ED Provider Notes (Addendum)
 Hemoptysis.  Increasing hypoxia on home oxygen  2 L baseline.  PE study pending.  Physical Exam  BP 137/81   Pulse 85   Temp 98.4 F (36.9 C) (Oral)   Resp 15   Ht 5\' 6"  (1.676 m)   Wt 75.3 kg   SpO2 94%   BMI 26.79 kg/m   Physical Exam  Procedures  Procedures  ED Course / MDM    Medical Decision Making Amount and/or Complexity of Data Reviewed Labs: ordered. Radiology: ordered.  Risk Prescription drug management.   Follow-up PE study reassess for disposition.  CT PE study personally reviewed by myself and interpreted by radiology does not show acute findings.  All noted findings are stable compared to prior studies.  I have extensively discussed the patient's history of right lower lobe lung cancer.  This was treated with 3 rounds of radiation therapy and deemed stable.  CT does not show any evolution since prior study.  Patient sees pulmonology and has known history of emphysema.  This is reflected in the CT scan without any appearance of acute pneumonia or noted significant bleb or pneumothorax.  On examination patient is alert with clear mental status.  Clinically well and nontoxic.  No respiratory distress.  Examination of the naris does not show any evidence of bleeding sites.  Patient does wear chronic home oxygen .  I have examined the mouth which is widely patent with normal posterior airway.  No clot or any evidence of any bleeding.  Patient has a very trivial hemangioma on the roof of her mouth that does not look likely to bleed and she reports she is never had bleeding from it.  Mucous membranes and oral surfaces otherwise in good condition.  Patient has no respiratory distress.  Patient reports intermittently now over the past week she has had self-limited episodes of coughing up blood-tinged sputum.  She reports it occurred last Sunday and then did not recur until today.  I have examined the sputum and it is thin slightly brown-tinged blood.  There does not appear to  be any large clot or bright red blood.  We discussed the possible differential diagnosis and the importance of following up expeditiously with her pulmonology group for further evaluation for bronchoscopy.  We discussed this extensively and patient voices understanding.  We reviewed the return precautions.       Wynetta Heckle, MD 10/17/23 4098    Wynetta Heckle, MD 10/17/23 [phone]

## 2023-10-18 ENCOUNTER — Ambulatory Visit: Payer: Self-pay | Admitting: Adult Health

## 2023-10-18 ENCOUNTER — Telehealth: Payer: Self-pay

## 2023-10-18 ENCOUNTER — Encounter: Payer: Self-pay | Admitting: Adult Health

## 2023-10-18 ENCOUNTER — Ambulatory Visit: Admitting: Adult Health

## 2023-10-18 ENCOUNTER — Telehealth: Payer: Self-pay | Admitting: *Deleted

## 2023-10-18 VITALS — BP 147/82 | HR 87 | Ht 66.0 in | Wt 169.8 lb

## 2023-10-18 DIAGNOSIS — J9611 Chronic respiratory failure with hypoxia: Secondary | ICD-10-CM

## 2023-10-18 DIAGNOSIS — J441 Chronic obstructive pulmonary disease with (acute) exacerbation: Secondary | ICD-10-CM

## 2023-10-18 DIAGNOSIS — C3431 Malignant neoplasm of lower lobe, right bronchus or lung: Secondary | ICD-10-CM | POA: Diagnosis not present

## 2023-10-18 DIAGNOSIS — R042 Hemoptysis: Secondary | ICD-10-CM | POA: Diagnosis not present

## 2023-10-18 DIAGNOSIS — Z9981 Dependence on supplemental oxygen: Secondary | ICD-10-CM | POA: Diagnosis not present

## 2023-10-18 DIAGNOSIS — I2699 Other pulmonary embolism without acute cor pulmonale: Secondary | ICD-10-CM

## 2023-10-18 DIAGNOSIS — Z87891 Personal history of nicotine dependence: Secondary | ICD-10-CM

## 2023-10-18 MED ORDER — CEFDINIR 300 MG PO CAPS
300.0000 mg | ORAL_CAPSULE | Freq: Two times a day (BID) | ORAL | 0 refills | Status: DC
Start: 2023-10-18 — End: 2024-01-14

## 2023-10-18 MED ORDER — PREDNISONE 10 MG PO TABS: ORAL_TABLET | ORAL | 0 refills | Status: DC

## 2023-10-18 MED ORDER — BENZONATATE 200 MG PO CAPS: 200.0000 mg | ORAL_CAPSULE | Freq: Three times a day (TID) | ORAL | 1 refills | Status: DC | PRN

## 2023-10-18 NOTE — Telephone Encounter (Signed)
 Copied from CRM [id number]. Topic: Clinical - Red Word Triage >> Oct 18, 2023  8:09 AM Juliana Ocean wrote: Red Word that prompted transfer to Nurse Triage: pt states she is bleeding from her mouth. (Bright red) Went to [facility] yesterday, and was told to follow up w/ pulmonary

## 2023-10-18 NOTE — Assessment & Plan Note (Signed)
 Hemoptysis-increased over the last 5 days.  Has had some intermittent episodes over the last several months.  Patient is on Eliquis  at baseline.  CT chest is unrevealing for acute process.  Does seem to be having underlying COPD exacerbation with increased cough.  Will treat for COPD exacerbation with antibiotics and steroid burst.  Along with aggressive cough control.  If unable to resolve hemoptysis will need to consider further evaluation with bronchoscopy. Long discussion with patient regarding plan if hemoptysis does not stop.  She has a close follow-up in the next 1 to 2 weeks.  If not resolved will need to consider bronchoscopy for further evaluation.  If worsens she is to go to the emergency room may need to be admitted.  Previously when Eliquis  was held for hemoptysis in the setting of cavitary pneumonia in 2023.  She developed a recurrent PE.  Will need to be very cautious if anticoagulation is held.  Plan  Patient Instructions  Omnicef  300mg  Twice daily- for 1 week -take as directed.  Prednisone  taper over next week-taper to 5mg  daily .  Continue Breztri  2 puffs Twice daily  , rinse after use  Begin Ohtuvayre  neb Twice daily. -when available.  Albuterol  inhaler or neb As needed  Continue on Zyrtec 10mg  daily.  Continue on Omeprazole daily. Continue on Eliquis  Twice daily   Begin Robitussin DM 2 tsp every 4-6 hr for cough As needed   Begin Tessalon Three times a day  for cough As needed   Continue on Oxygen  2l/m, increase 3l/m with activity.  Activity as tolerated.  CT chest as planned per Radiation/Oncology If blood in mucus does not stop call our office immediately will need to be seen sooner  If blood in mucus increases/worsen go to ER.  Follow up in 1-2 weeks with Dr. Dione Franks or Ruweyda Macknight NP and As needed  -30 min slot  Please contact office for sooner follow up if symptoms do not improve or worsen or seek emergency care

## 2023-10-18 NOTE — Progress Notes (Signed)
 @Patient  ID: [name], female    DOB: May 27, 1941, 83 y.o.   MRN: [id number]  Chief Complaint  Patient presents with   Acute Visit    Referring provider: Jhon Moselle, [name]  HPI: 83 year old female former smoker followed for severe COPD and chronic respiratory failure on oxygen .  Lung cancer (non-small cell lung cancer diagnosed November 2024 status post XRT) History of right lung abscess, recurrent PE/DVT on lifelong anticoagulation therapy  TEST/EVENTS :  CT chest and November 17, 2021 showed emphysema no suspicious pulmonary nodules or masses. No consolidation.    02/13/22>> CT angio chest: Submassive PE, near complete resolution of RLL PNA. 02/14/22>> Echo: EF 70-75%, RV systolic function is severely reduced. 02/14/22>> bilateral lower extremity Doppler: DVT right femoral/popliteal/tibial/peroneal, left femoral vein   01/23/22>> CTA chest: New large alveolar infiltrate in the right lower lobe with cavitary lesion.  No PE. 01/25/22 >> barium esophagogram: No aspiration observed-no strictures/masses. 01/24/22 >> BAL cytology: No malignant cells.    CT chest September 26, 2022 showed resolved cavitary lesion in the right lower lobe.  Enlarged right lower lobe nodule measuring 1.3 cm previously 1.  0 cm.  Bilateral breast implants with intracapsular rupture, emphysema, small subsegmental emboli in the right lower lobe and right middle lobe.  ? chronic.   CT chest February 09, 2023 spiculated right lower lobe nodule slight growth from April 2024 worrisome for bronchogenic carcinoma.   PET scan February 28, 2023 hypermetabolic 15 mm right lower lobe nodule, no hypermetabolic adenopathy.  No findings for pulmonary metastatic disease.   Venous Doppler February 14, 2022 acute DVT right femoral vein, right popliteal vein, right popliteal tibial veins and right peroneal veins, acute DVT involving the left femoral vein   Venous Doppler 09/26/22 negative DVT right lower extremity, DVT left  popliteal vein age-indeterminate, chronic deep venous thrombosis involving the left femoral vein    2D echo February 14, 2022 EF 70 to 75%, grade 1 diastolic dysfunction, right ventricular systolic function severely reduced   Echo November 10, 2022 EF 60 to 65%, grade 1 diastolic dysfunction, RV SF normal,, RV size normal, normal pulmonary artery systolic pressure.   PFT November 16, 2022 showed  severe COPD with FEV1 at 29%, ratio 38, FVC 59%, positive bronchodilator response with FEV1 postbronchodilator at 33%, ratio 36, FVC 69%, DLCO 36%   04/09/23 Nav Bronch- RLL lesion -NSCLC- carcinoma is positive with TTF-1 and negative with p40 and cytokeratin 5/6 consistent with lung adenocarcinoma.    May 14, 2023 MRI brain negative for mets.  10/18/2023 Acute OV: COPD, O2 RF , Lung Cancer , Hemoptysis Patient presents for an acute office visit.  Complains of hemoptysis that started 5 days ago. Had bright red blood on /off  last 3 days. None since yesterday morning .  Seen in the emergency room yesterday CT chest angio was negative for PE.  No significant adenopathy.  Showed emphysema.  No acute process.  Stable right lower lobe nodule. History of recurrent PE.  On lifelong anticoagulation with Eliquis  . ER notes reviewed ,Hemoglobin decreased 12>10.5. Nml platelets.  Says on occasion over last 6 month would see slight blood tinged mucus 1-2 times a month. Remains on Eliquis  Twice daily . Has had yellow thick mucus and wheezing intermittently and worse for last week.   Previous history of Cavitary pneumonia with hemoptysis. Eliquis  held due to this, developed recurrent PE while off Eliquis .   Diagnosed with non-small cell lung cancer November 2024 after navigational bronchoscopy-right  lower lobe nodule- completed XRT . Surveillance CT chest September 26, 2023 showed decrease in right lower lobe nodule.  No new suspicious nodules noted.  Followed by radiation oncology.  Remains on oxygen  2 L at rest and 3 L  with activity.  No increased oxygen  demand.  Last visit was recommended to begin Ohtuvayre  to help with COPD burden and frequent exacerbations.  She has not received this today.  She does remain on chronic steroids due to recurrent COPD exacerbations currently on prednisone  5 mg daily.  Endorses compliance with Breztri  twice daily.  Uses albuterol  or albuterol  nebulizer most days.   Allergies  Allergen Reactions   Pneumococcal Vaccines Shortness Of Breath and Other (See Comments)    Couldn't breathe    Clarithromycin Swelling   Estradiol Rash    Reaction to cream   Sulfa Antibiotics Nausea And Vomiting and Swelling   Doxycycline  Hyclate Other (See Comments)    stomach distress   Pneumococcal Polysaccharide Vaccine Other (See Comments)    Unknown   Sulfacetamide Sodium-Sulfur Other (See Comments)   Sulfonamide Derivatives Swelling    Immunization History  Administered Date(s) Administered   Fluad Quad(high Dose 65+) 01/10/2021   Influenza Split 02/27/2011, 02/27/2012, 02/26/2013, 02/26/2014, 02/27/2016, 03/25/2017   Influenza Whole 02/07/2009   Influenza, High Dose Seasonal PF 03/17/2014, 02/27/2017, 03/27/2018   Influenza, Quadrivalent, Recombinant, Inj, Pf 02/25/2018, 04/08/2019, 03/05/2020, 02/11/2021, 02/27/2023   Influenza,inj,Quad PF,6+ Mos 03/29/2022   Influenza-Unspecified 01/27/2013, 03/05/2015, 03/05/2020   PFIZER(Purple Top)SARS-COV-2 Vaccination 07/12/2019, 08/06/2019, 10/06/2019   Tdap 10/07/2013, 04/23/2022    Past Medical History:  Diagnosis Date   Allergic rhinitis    Anemia due to GI blood loss    Anxiety    Asthma    Chronic airway obstruction, not elsewhere classified    Depressive disorder, not elsewhere classified    DVT (deep venous thrombosis) (HCC)    LLE DVT 01/13/19   Dyspnea    Esophageal reflux    Essential hypertension 08/27/2019   History of radiation therapy    Right lung-05/15/23-05/21/23- Dr. Retta Caster   PE (pulmonary thromboembolism)  (HCC) 01/29/2019   Pneumonia    Wears partial dentures     Tobacco History: Social History   Tobacco Use  Smoking Status Former   Current packs/day: 0.00   Average packs/day: 0.3 packs/day for 40.0 years (10.0 ttl pk-yrs)   Types: Cigarettes   Start date: 61   Quit date: 2000   Years since quitting: 25.4  Smokeless Tobacco Never   Counseling given: Not Answered   Outpatient Medications Prior to Visit  Medication Sig Dispense Refill   acetaminophen  (TYLENOL ) 500 MG tablet Take 500 mg by mouth every 6 (six) hours as needed for headache (pain).     albuterol  (PROVENTIL ) (2.5 MG/3ML) 0.083% nebulizer solution Take 3 mLs (2.5 mg total) by nebulization every 6 (six) hours as needed for wheezing or shortness of breath. 120 mL 6   albuterol  (VENTOLIN  HFA) 108 (90 Base) MCG/ACT inhaler Inhale 1-2 puffs into the lungs every 6 (six) hours as needed for wheezing or shortness of breath.     apixaban  (ELIQUIS ) 5 MG TABS tablet Take 1 tablet (5 mg total) by mouth 2 (two) times daily. 28 tablet 0   budesonide -glycopyrrolate -formoterol  (BREZTRI  AEROSPHERE) 160-9-4.8 MCG/ACT AERO inhaler Inhale 2 puffs into the lungs in the morning and at bedtime. 1 each 5   budesonide -glycopyrrolate -formoterol  (BREZTRI  AEROSPHERE) 160-9-4.8 MCG/ACT AERO inhaler Inhale 2 puffs into the lungs in the morning and at bedtime.  cetirizine (ZYRTEC) 10 MG tablet Take 10 mg by mouth daily.     Ensifentrine  (OHTUVAYRE ) 3 MG/2.5ML SUSP Inhale 1 Inhalation into the lungs in the morning and at bedtime.     escitalopram  (LEXAPRO ) 20 MG tablet Take 20 mg by mouth daily.     fluticasone  (FLONASE ) 50 MCG/ACT nasal spray Place 2 sprays into both nostrils daily. 9.9 mL 0   furosemide  (LASIX ) 20 MG tablet Take 20 mg by mouth daily as needed for fluid or edema. Takes when having swelling.     LORazepam  (ATIVAN ) 1 MG tablet Take 1 mg by mouth at bedtime.     omeprazole (PRILOSEC) 20 MG capsule Take 20 mg by mouth 2 (two) times  daily before a meal.     predniSONE  (DELTASONE ) 5 MG tablet Take 1 tablet (5 mg total) by mouth daily with breakfast. 30 tablet 3   traZODone  (DESYREL ) 100 MG tablet Take 100 mg by mouth at bedtime.     triamterene -hydrochlorothiazide  (MAXZIDE -25) 37.5-25 MG tablet Take 0.5 tablets by mouth daily.     No facility-administered medications prior to visit.     Review of Systems:   Constitutional:   No  weight loss, night sweats,  Fevers, chills,+ fatigue, or  lassitude.  HEENT:   No headaches,  Difficulty swallowing,  Tooth/dental problems, or  Sore throat,                No sneezing, itching, ear ache, nasal congestion, post nasal drip,   CV:  No chest pain,  Orthopnea, PND, swelling in lower extremities, anasarca, dizziness, palpitations, syncope.   GI  No heartburn, indigestion, abdominal pain, nausea, vomiting, diarrhea, change in bowel habits, loss of appetite, bloody stools.   Resp:   No chest wall deformity  Skin: no rash or lesions.  GU: no dysuria, change in color of urine, no urgency or frequency.  No flank pain, no hematuria   MS:  No joint pain or swelling.  No decreased range of motion.  No back pain.    Physical Exam  BP (!) 147/82 (BP Location: Left Wrist, Patient Position: Sitting, Cuff Size: Normal)   Pulse 87   Ht 5\' 6"  (1.676 m)   Wt 169 lb 12.8 oz (77 kg)   SpO2 92% Comment: 2L pulsed  BMI 27.41 kg/m   GEN: A/Ox3; pleasant , NAD, chronically ill appearing, on O2    HEENT:  [place]/AT,  EACs-clear, TMs-wnl, NOSE-clear, THROAT-clear, no lesions, no postnasal drip or exudate noted.   NECK:  Supple w/ fair ROM; no JVD; normal carotid impulses w/o bruits; no thyromegaly or nodules palpated; no lymphadenopathy.    RESP  +faint exp wheezing  no accessory muscle use, no dullness to percussion  CARD:  RRR, no m/r/g, no peripheral edema, pulses intact, no cyanosis or clubbing.  GI:   Soft & nt; nml bowel sounds; no organomegaly or masses detected.   Musco: Warm  bil, no deformities or joint swelling noted.   Neuro: alert, no focal deficits noted.    Skin: Warm, no lesions or rashes    Lab Results:  CBC    Component Value Date/Time   WBC 7.8 10/17/2023 1119   RBC 4.35 10/17/2023 1119   HGB 10.5 (L) 10/17/2023 1119   HCT 36.0 10/17/2023 1119   PLT 348 10/17/2023 1119   MCV 82.8 10/17/2023 1119   MCH 24.1 (L) 10/17/2023 1119   MCHC 29.2 (L) 10/17/2023 1119   RDW 17.0 (H) 10/17/2023 1119  LYMPHSABS 0.7 05/28/2023 1328   MONOABS 1.0 05/28/2023 1328   EOSABS 0.1 05/28/2023 1328   BASOSABS 0.1 05/28/2023 1328    BMET    Component Value Date/Time   NA 141 10/17/2023 1119   K 3.8 10/17/2023 1119   CL 101 10/17/2023 1119   CO2 30 10/17/2023 1119   GLUCOSE 116 (H) 10/17/2023 1119   BUN 23 10/17/2023 1119   CREATININE 0.84 10/17/2023 1119   CREATININE 0.80 07/28/2019 1014   CALCIUM  9.4 10/17/2023 1119   GFRNONAA >60 10/17/2023 1119   GFRNONAA >60 07/28/2019 1014   GFRAA >60 11/22/2019 0603   GFRAA >60 07/28/2019 1014    BNP    Component Value Date/Time   BNP 43.1 05/28/2023 1328    ProBNP    Component Value Date/Time   PROBNP 67.2 10/17/2023 1119    Imaging: CT Angio Chest PE W and/or Wo Contrast Result Date: 10/17/2023 CLINICAL DATA:  History of lung cancer, coughing up blood EXAM: CT ANGIOGRAPHY CHEST WITH CONTRAST TECHNIQUE: Multidetector CT imaging of the chest was performed using the standard protocol during bolus administration of intravenous contrast. Multiplanar CT image reconstructions and MIPs were obtained to evaluate the vascular anatomy. RADIATION DOSE REDUCTION: This exam was performed according to the departmental dose-optimization program which includes automated exposure control, adjustment of the mA and/or kV according to patient size and/or use of iterative reconstruction technique. CONTRAST:  75mL OMNIPAQUE  IOHEXOL  350 MG/ML SOLN COMPARISON:  Chest x-ray 05/28/2023, CT chest 09/26/2023, PET CT 02/28/2023,  CT chest 02/09/2023, 11/07/2022 FINDINGS: Cardiovascular: Satisfactory opacification of the pulmonary arteries to the segmental level. No evidence of pulmonary embolism. Mild aortic atherosclerosis. Stable mild aneurysmal dilatation of the ascending aorta up to 4.1 cm. Coronary vascular calcification. Normal cardiac size. No pericardial effusion Mediastinum/Nodes: Patent trachea. No thyroid  mass. Subcentimeter mediastinal lymph nodes. Esophagus within normal limits. Lungs/Pleura: Emphysema. No acute airspace disease, pleural effusion or pneumothorax. Right superior lower lobe spiculated pulmonary nodule measures 12 x 10 mm on series 6, image 69, previously 9 x 12 mm. Mild scarring in the lower lobes. Upper Abdomen: No acute finding Musculoskeletal: No acute osseous abnormality Review of the MIP images confirms the above findings. IMPRESSION: 1. Negative for acute pulmonary embolus or aortic dissection. 2. Emphysema. No acute airspace disease. 3. Stable appearance of spiculated superior right lower lobe pulmonary nodule corresponding to patient's history malignancy. 4. Stable mild aneurysmal dilatation of the ascending aorta up to 4.1 cm. Recommend annual imaging followup by CTA or MRA. This recommendation follows 2010 ACCF/AHA/AATS/ACR/ASA/SCA/SCAI/SIR/STS/SVM Guidelines for the Diagnosis and Management of Patients with Thoracic Aortic Disease. Circulation. 2010; 121: Z610-R604. Aortic aneurysm NOS (ICD10-I71.9) 5. Aortic atherosclerosis. Aortic Atherosclerosis (ICD10-I70.0) and Emphysema (ICD10-J43.9). Electronically Signed   By: Esmeralda Hedge M.D.   On: 10/17/2023 15:33   CT CHEST WO CONTRAST Result Date: 09/27/2023 CLINICAL DATA:  Non-small cell lung cancer, follow-up examination. * Tracking Code: BO * EXAM: CT CHEST WITHOUT CONTRAST TECHNIQUE: Multidetector CT imaging of the chest was performed following the standard protocol without IV contrast. RADIATION DOSE REDUCTION: This exam was performed according to  the departmental dose-optimization program which includes automated exposure control, adjustment of the mA and/or kV according to patient size and/or use of iterative reconstruction technique. COMPARISON:  02/09/2023 FINDINGS: Cardiovascular: Extensive multi-vessel coronary artery calcification. Global cardiac size within normal limits. No pericardial effusion. Central pulmonary arteries are are stably enlarged in keeping with changes of pulmonary arterial hypertension. Mild atherosclerotic calcification within the thoracic aorta. Stable fusiform enlargement  of the ascending aorta measuring 4.1 cm in greatest dimension. Descending thoracic aorta is of normal caliber. Mediastinum/Nodes: No enlarged mediastinal or axillary lymph nodes. Thyroid  gland, trachea, and esophagus demonstrate no significant findings. Lungs/Pleura: Spiculated nodule within the right lower lobe, axial image # 67/6 measures 9 x 12 mm, decreased in size since prior examination. No new focal pulmonary nodules or infiltrates. Severe emphysema. Stable parenchymal scarring within the right middle and lower lobes. No pneumothorax or pleural effusion. No central obstructing lesion. Upper Abdomen: No acute abnormality. No pathologic adenopathy within the visualized upper abdomen retroperitoneum. Adrenal glands are unremarkable. Musculoskeletal: No acute bone abnormality. No lytic or blastic bone lesion. Osseous structures are age appropriate. Bilateral breast implants again noted. IMPRESSION: 1. Spiculated right lower lobe pulmonary nodule corresponding the patient's known primary malignancy, decreased in size since prior examination of 02/09/2023. No new focal pulmonary nodules or infiltrates. 2. Extensive multi-vessel coronary artery calcification. 3. Stable fusiform enlargement of the ascending aorta measuring 4.1 cm in greatest dimension. Recommend annual imaging followup by CTA or MRA. This recommendation follows 2010  ACCF/AHA/AATS/ACR/ASA/SCA/SCAI/SIR/STS/SVM Guidelines for the Diagnosis and Management of Patients with Thoracic Aortic Disease. Circulation. 2010; 121: Z610-R604. Aortic aneurysm NOS (ICD10-I71.9) 4. Enlarged central pulmonary arteries in keeping with changes of pulmonary arterial hypertension. Aortic Atherosclerosis (ICD10-I70.0) and Emphysema (ICD10-J43.9). Electronically Signed   By: Worthy Heads M.D.   On: 09/27/2023 21:08    Administration History     None          Latest Ref Rng & Units 11/15/2022    8:23 AM  PFT Results  FVC-Pre L 1.72   FVC-Predicted Pre % 59   FVC-Post L 2.01   FVC-Predicted Post % 69   Pre FEV1/FVC % % 38   Post FEV1/FCV % % 36   FEV1-Pre L 0.65   FEV1-Predicted Pre % 29   FEV1-Post L 0.72   DLCO uncorrected ml/min/mmHg 7.38   DLCO UNC% % 36   DLCO corrected ml/min/mmHg 7.31   DLCO COR %Predicted % 36   DLVA Predicted % 49   TLC L 6.16   TLC % Predicted % 115   RV % Predicted % 171     No results found for: "NITRICOXIDE"      Assessment & Plan:   Hemoptysis Hemoptysis-increased over the last 5 days.  Has had some intermittent episodes over the last several months.  Patient is on Eliquis  at baseline.  CT chest is unrevealing for acute process.  Does seem to be having underlying COPD exacerbation with increased cough.  Will treat for COPD exacerbation with antibiotics and steroid burst.  Along with aggressive cough control.  If unable to resolve hemoptysis will need to consider further evaluation with bronchoscopy. Long discussion with patient regarding plan if hemoptysis does not stop.  She has a close follow-up in the next 1 to 2 weeks.  If not resolved will need to consider bronchoscopy for further evaluation.  If worsens she is to go to the emergency room may need to be admitted.  Previously when Eliquis  was held for hemoptysis in the setting of cavitary pneumonia in 2023.  She developed a recurrent PE.  Will need to be very cautious if  anticoagulation is held.  Plan  Patient Instructions  Omnicef  300mg  Twice daily- for 1 week -take as directed.  Prednisone  taper over next week-taper to 5mg  daily .  Continue Breztri  2 puffs Twice daily  , rinse after use  Begin Ohtuvayre  neb Twice daily. -when  available.  Albuterol  inhaler or neb As needed  Continue on Zyrtec 10mg  daily.  Continue on Omeprazole daily. Continue on Eliquis  Twice daily   Begin Robitussin DM 2 tsp every 4-6 hr for cough As needed   Begin Tessalon Three times a day  for cough As needed   Continue on Oxygen  2l/m, increase 3l/m with activity.  Activity as tolerated.  CT chest as planned per Radiation/Oncology If blood in mucus does not stop call our office immediately will need to be seen sooner  If blood in mucus increases/worsen go to ER.  Follow up in 1-2 weeks with Dr. Dione Franks or Berneice Zettlemoyer NP and As needed  -30 min slot  Please contact office for sooner follow up if symptoms do not improve or worsen or seek emergency care     COPD with acute exacerbation (HCC) Acute COPD exacerbation.  Will treat with Omnicef  and prednisone  burst over the next week add cough control regimen.  Plan  Patient Instructions  Omnicef  300mg  Twice daily- for 1 week -take as directed.  Prednisone  taper over next week-taper to 5mg  daily .  Continue Breztri  2 puffs Twice daily  , rinse after use  Begin Ohtuvayre  neb Twice daily. -when available.  Albuterol  inhaler or neb As needed  Continue on Zyrtec 10mg  daily.  Continue on Omeprazole daily. Continue on Eliquis  Twice daily   Begin Robitussin DM 2 tsp every 4-6 hr for cough As needed   Begin Tessalon Three times a day  for cough As needed   Continue on Oxygen  2l/m, increase 3l/m with activity.  Activity as tolerated.  CT chest as planned per Radiation/Oncology If blood in mucus does not stop call our office immediately will need to be seen sooner  If blood in mucus increases/worsen go to ER.  Follow up in 1-2 weeks with Dr.  Dione Franks or Jonmarc Bodkin NP and As needed  -30 min slot  Please contact office for sooner follow up if symptoms do not improve or worsen or seek emergency care     Chronic respiratory failure with hypoxia (HCC) Continue on oxygen  to maintain O2 saturations greater than 88 to 90%.  Currently based on oxygen  at 2 L at rest and 3 L with activity  Pulmonary embolism (HCC) History of recurrent PE.  Continue on lifelong anticoagulation therapy.  Continue to monitor very closely with hemoptysis.  Primary malignant neoplasm of right lower lobe of lung (HCC) History of non-small cell lung cancer.  Status post XRT.  Most recent surveillance CT chest and recent CT chest in ER with stable right lower lobe nodule.     Roena Clark, NP 10/18/2023

## 2023-10-18 NOTE — Assessment & Plan Note (Signed)
 Continue on oxygen  to maintain O2 saturations greater than 88 to 90%.  Currently based on oxygen  at 2 L at rest and 3 L with activity

## 2023-10-18 NOTE — Assessment & Plan Note (Signed)
 Acute COPD exacerbation.  Will treat with Omnicef  and prednisone  burst over the next week add cough control regimen.  Plan  Patient Instructions  Omnicef  300mg  Twice daily- for 1 week -take as directed.  Prednisone  taper over next week-taper to 5mg  daily .  Continue Breztri  2 puffs Twice daily  , rinse after use  Begin Ohtuvayre  neb Twice daily. -when available.  Albuterol  inhaler or neb As needed  Continue on Zyrtec 10mg  daily.  Continue on Omeprazole daily. Continue on Eliquis  Twice daily   Begin Robitussin DM 2 tsp every 4-6 hr for cough As needed   Begin Tessalon Three times a day  for cough As needed   Continue on Oxygen  2l/m, increase 3l/m with activity.  Activity as tolerated.  CT chest as planned per Radiation/Oncology If blood in mucus does not stop call our office immediately will need to be seen sooner  If blood in mucus increases/worsen go to ER.  Follow up in 1-2 weeks with Dr. Dione Franks or Jema Deegan NP and As needed  -30 min slot  Please contact office for sooner follow up if symptoms do not improve or worsen or seek emergency care

## 2023-10-18 NOTE — Telephone Encounter (Signed)
 Chief Complaint: hemoptysis Symptoms: blood, mild SOB/wheezing above baseline Frequency: since Sunday Pertinent Negatives: Patient denies fever, URI sx, CP, severe SOB, injuries, falls Disposition: [] ED /[] Urgent Care (no appt availability in office) / [x] Appointment(In office/virtual)/ []  [facility]/ [] Home Care/ [] Refused Recommended Disposition /[] [place] [facility]/ []  Follow-up with PCP Additional Notes: Pt c/o bleeding from mouth. Pt went to [facility] ED yesterday and CT scan was done and was instructed to F/U with [facility]. On Sunday, Pt experience significant bleeding from mouth that filled half of a red solo cup. Pt endorses significant bleeding has stopped, but is still intermittently bleeding (filling a few tsp--not as much volume). Pt reports taking respiratory medications as instructed and is on Elequis. Pt denies any recent injuries/falls, fevers, URI sx, CP, or severe SOB. Triager does not appreciate audible SOB/wheezing during call. Pt is speaking in full sentences. Scheduled patient per protocol on Oct 18, 2023. Patient verbalized understanding and to go to ED with worsening symptoms.           Reason for Disposition  [1] Coughed up blood AND [2] > 1 tablespoon (15 ml)  Answer Assessment - Initial Assessment Questions E2C2 Pulmonary Triage - Initial Assessment Questions "Chief Complaint (e.g., cough, sob, wheezing, fever, chills, sweat or additional symptoms) *Go to specific symptom protocol after initial questions. Blood coming from mouth Pt reports on Sunday, from 1300-2200, had significant bleeding that filled about half a solo cup. Significant bleeding has stopped, but pt is still experiencing intermittent bleeding (fills a few teaspoons, not as much volume as Sunday)  "How long have symptoms been present?" Sunday  Have you tested for COVID or Flu? Note: If not, ask patient if a home test can be taken. If so, instruct patient to call back for positive  results. No  MEDICINES:     "Have you used your inhalers/maintenance medication?" Yes If yes, "What medications?" Breztri  - 2 puffs twice daily Albuterol  PRN Elequis - twice a day denies injuries fall  If inhaler, ask "How many puffs and how often?" Note: Review instructions on medication in the chart. See above  OXYGEN : "Do you wear supplemental oxygen ?" Yes If yes, "How many liters are you supposed to use?" 2L with exertion  "Do you monitor your oxygen  levels?" Yes If yes, "What is your reading (oxygen  level) today?" 88 on 2L with exertion   "What is your usual oxygen  saturation reading?"  (Note: Pulmonary O2 sats should be 90% or greater) 90-92     4. HEMOPTYSIS: "How much blood?" (flecks, streaks, tablespoons, etc.)     Significant amount on Sunday 5. DIFFICULTY BREATHING: "Are you having difficulty breathing?" If Yes, ask: "How bad is it?" (e.g., mild, moderate, severe)    - MILD: No SOB at rest, mild SOB with walking, speaks normally in sentences, can lie down, no retractions, pulse < 100.    - MODERATE: SOB at rest, SOB with minimal exertion and prefers to sit, cannot lie down flat, speaks in phrases, mild retractions, audible wheezing, pulse 100-120.    - SEVERE: Very SOB at rest, speaks in single words, struggling to breathe, sitting hunched forward, retractions, pulse > 120      Mild sob/wheezing above baseline 6. FEVER: "Do you have a fever?" If Yes, ask: "What is your temperature, how was it measured, and when did it start?"     denies 7. CARDIAC HISTORY: "Do you have any history of heart disease?" (e.g., heart attack, congestive heart failure)  Denies Endorses "main artery is thick or something" 8. LUNG HISTORY: "Do you have any history of lung disease?"  (e.g., pulmonary embolus, asthma, emphysema)     COPD, lung cx 9. PE RISK FACTORS: "Do you have a history of blood clots?" (or: recent major surgery, recent prolonged travel, bedridden)     PE hx, DVT  hx 10. OTHER SYMPTOMS: "Do you have any other symptoms?" (e.g., runny nose, wheezing, chest pain)       denies  Protocols used: Coughing Up Blood-A-AH

## 2023-10-18 NOTE — Patient Instructions (Addendum)
 Omnicef  300mg  Twice daily- for 1 week -take as directed.  Prednisone  taper over next week-taper to 5mg  daily .  Continue Breztri  2 puffs Twice daily  , rinse after use  Begin Ohtuvayre  neb Twice daily. -when available.  Albuterol  inhaler or neb As needed  Continue on Zyrtec 10mg  daily.  Continue on Omeprazole daily. Continue on Eliquis  Twice daily   Begin Robitussin DM 2 tsp every 4-6 hr for cough As needed   Begin Tessalon Three times a day  for cough As needed   Continue on Oxygen  2l/m, increase 3l/m with activity.  Activity as tolerated.  CT chest as planned per Radiation/Oncology If blood in mucus does not stop call our office immediately will need to be seen sooner  If blood in mucus increases/worsen go to ER.  Follow up in 1-2 weeks with Dr. Dione Franks or Lannie Heaps NP and As needed  -30 min slot  Please contact office for sooner follow up if symptoms do not improve or worsen or seek emergency care

## 2023-10-18 NOTE — Telephone Encounter (Signed)
 Paperwork for Ohtuvayre  placed in pharmacy box up front on 10/16/2023.

## 2023-10-18 NOTE — Assessment & Plan Note (Signed)
 History of recurrent PE.  Continue on lifelong anticoagulation therapy.  Continue to monitor very closely with hemoptysis.

## 2023-10-18 NOTE — Telephone Encounter (Signed)
 Received Ohtuvayre new start paperwork. Completed form and faxed with clinicals and insurance card copy to [facility]   Phone#: [phone] Fax#: [phone]

## 2023-10-18 NOTE — Telephone Encounter (Signed)
 Patient seen in office today, issue addressed.  Nothing further needed.

## 2023-10-18 NOTE — Assessment & Plan Note (Signed)
 History of non-small cell lung cancer.  Status post XRT.  Most recent surveillance CT chest and recent CT chest in ER with stable right lower lobe nodule.

## 2023-10-18 NOTE — Telephone Encounter (Signed)
 Seen in ER , OV 5/22

## 2023-10-23 ENCOUNTER — Telehealth: Payer: Self-pay | Admitting: *Deleted

## 2023-10-23 NOTE — Telephone Encounter (Signed)
 Called patient to inform of Ct for 04-04-24 - arrival time- 12:45 pm @ [facility] no restrictions to scan, patient to receive results from Dr. Eloise Hake on 04-10-24 @ 11 am, no answer will call later

## 2023-10-29 ENCOUNTER — Ambulatory Visit: Admitting: Internal Medicine

## 2023-10-29 ENCOUNTER — Encounter: Payer: Self-pay | Admitting: Internal Medicine

## 2023-10-29 VITALS — BP 147/72 | HR 75 | Ht 66.0 in | Wt 172.4 lb

## 2023-10-29 DIAGNOSIS — J9612 Chronic respiratory failure with hypercapnia: Secondary | ICD-10-CM

## 2023-10-29 DIAGNOSIS — J449 Chronic obstructive pulmonary disease, unspecified: Secondary | ICD-10-CM | POA: Diagnosis not present

## 2023-10-29 DIAGNOSIS — J9611 Chronic respiratory failure with hypoxia: Secondary | ICD-10-CM

## 2023-10-29 DIAGNOSIS — C3491 Malignant neoplasm of unspecified part of right bronchus or lung: Secondary | ICD-10-CM | POA: Diagnosis not present

## 2023-10-29 DIAGNOSIS — R042 Hemoptysis: Secondary | ICD-10-CM | POA: Diagnosis not present

## 2023-10-29 NOTE — Patient Instructions (Addendum)
 It was a pleasure to see you today!  Please schedule follow up with myself in 3 months.  If my schedule is not open yet, we will contact you with a reminder closer to that time. Please call [phone] if you haven't heard from us  a month before, and always call us  sooner if issues or concerns arise. You can also send us  a message through MyChart, but but aware that this is not to be used for urgent issues and it may take up to 5-7 days to receive a reply. Please be aware that you will likely be able to view your results before I have a chance to respond to them. Please give us  5 business days to respond to any non-urgent results.    It was a pleasure to meet you today.  I am referring you to pulmonary rehab which is the structured exercise program for patients with chronic lung disease.  They will call you to schedule.  Continue the Breztri , 2 puffs twice daily.  Continue the albuterol  nebulizer needs.  Go ahead and start the Ohytuvayre the new nebulized medication to see if it helps your breathing.  I am not surprised if you cough up small amounts, coin sized, of blood given the fact that you have chronic lung disease and are on Eliquis  blood thinner.  However if it increases in frequency or amount, such as spoonfuls or cupfuls, You need to let us  know soon as possible.  Taper off prednisone  to go to 5 mg every other day for the next month, then discontinue.   In the future we could also consider trying to qualify you for a BiPAP machine for COPD.  This would involve an overnight oximetry test as well as an ABG which is a blood test to see if your body is retaining carbon dioxide.

## 2023-10-29 NOTE — Progress Notes (Signed)
 [name]    [id number]    [birth year]  Primary Care Physician:Prevost, Arlice Lai, [name] Date of Appointment: 10/29/2023 Established Patient Visit  Chief complaint:   Chief Complaint  Patient presents with   Follow-up    Transfer of care     HPI: [name] is a 83 y.o. woman with severe COPD on home oxygen  therapy as well as NSCLC s/p XRT in November 2024, with recurrent DVT/PE on eliquis . Additional history fo pulmonary abscess  Interval Updates: Seen by multiple providers in the past and here to establish care with me.   Current therapy: chronic prednisone  5 mg daily,  Breztri  2 puffs twice daily, prn albuterol . Has nebulizer machine at home(takes twice daily or more if needed.)   Hasn't started ohtuvayre  yet because she is worried about side effects.   Eliquis  for DVT/PE, lifelong. Had episodes of hemoptysis. CTPE study negative with otherwise stable CT Chest no progression of cancer, or pneumonia. Hasn't had any hemoptysis now in about a week. No fevers chills night sweats.   Very short of breath all the time. Continues to live independently with her yorkie. Wishes she could be less sob with daily activity. Has family nearby that checks on her - DIL and Son in town.    Omeprazole for GERD. Feels well controlled on this.    I have reviewed the patient's family social and past medical history and updated as appropriate.   Past Medical History:  Diagnosis Date   Allergic rhinitis    Anemia due to GI blood loss    Anxiety    Asthma    Chronic airway obstruction, not elsewhere classified    Depressive disorder, not elsewhere classified    DVT (deep venous thrombosis) (HCC)    LLE DVT 01/13/19   Dyspnea    Esophageal reflux    Essential hypertension 08/27/2019   History of radiation therapy    Right lung-05/15/23-05/21/23- Dr. Retta Caster   PE (pulmonary thromboembolism) (HCC) 01/29/2019   Pneumonia    Wears partial dentures     Past Surgical  History:  Procedure Laterality Date   ABDOMINAL HYSTERECTOMY     ABDOMINAL HYSTERECTOMY     BRONCHIAL BIOPSY  04/09/2023   Procedure: BRONCHIAL BIOPSIES;  Surgeon: Prudy Brownie, DO;  Location: [facility];  Service: Pulmonary;;   BRONCHIAL BRUSHINGS  04/09/2023   Procedure: BRONCHIAL BRUSHINGS;  Surgeon: Prudy Brownie, DO;  Location: [facility];  Service: Pulmonary;;   BRONCHIAL NEEDLE ASPIRATION BIOPSY  04/09/2023   Procedure: BRONCHIAL NEEDLE ASPIRATION BIOPSIES;  Surgeon: Prudy Brownie, DO;  Location: [facility];  Service: Pulmonary;;   BRONCHIAL WASHINGS  01/24/2022   Procedure: BRONCHIAL WASHINGS;  Surgeon: Marine Sia, MD;  Location: [facility]OS[facility];  Service: Cardiopulmonary;;   CATARACT EXTRACTION W/ INTRAOCULAR LENS  IMPLANT, BILATERAL     CHOLECYSTECTOMY N/A 08/12/2021   Procedure: LAPAROSCOPIC CHOLECYSTECTOMY;  Surgeon: Junie Olds, MD;  Location: [facility] ORS;  Service: General;  Laterality: N/A;   COLONOSCOPY     COLONOSCOPY WITH PROPOFOL  N/A 08/20/2019   Procedure: COLONOSCOPY WITH PROPOFOL ;  Surgeon: Lanita Pitman, MD;  Location: [facility] ENDOSCOPY;  Service: Endoscopy;  Laterality: N/A;   CORONARY ULTRASOUND/IVUS Left 05/26/2019   Procedure: Intravascular Ultrasound/IVUS;  Surgeon: Adine Hoof, MD;  Location: [facility] INVASIVE CV LAB;  Service: Cardiovascular;  Laterality: Left;   CYSTOSCOPY     DILATION AND CURETTAGE OF UTERUS     ECTROPION REPAIR  Bilateral 05/12/2021   Procedure: REPAIR OF ECTROPION BY LATERAL TARSAL STRIP OF BILATERAL LOWER EYE LIDS;  Surgeon: Debbra Fairy, MD;  Location: [facility] OR;  Service: Ophthalmology;  Laterality: Bilateral;   ESOPHAGOGASTRODUODENOSCOPY (EGD) WITH PROPOFOL  N/A 08/20/2019   Procedure: ESOPHAGOGASTRODUODENOSCOPY (EGD) WITH PROPOFOL ;  Surgeon: Lanita Pitman, MD;  Location: [facility]OS[facility];  Service: Endoscopy;  Laterality: N/A;   FIDUCIAL MARKER PLACEMENT  04/09/2023   Procedure: FIDUCIAL MARKER PLACEMENT;   Surgeon: Prudy Brownie, DO;  Location: [facility] ENDOS[facility];  Service: Pulmonary;;   HEMOSTASIS CONTROL  08/20/2019   Procedure: HEMOSTASIS CONTROL;  Surgeon: Lanita Pitman, MD;  Location: [facility] ENDOSCOPY;  Service: Endoscopy;;   HEMOSTASIS CONTROL  04/09/2023   Procedure: HEMOSTASIS CONTROL;  Surgeon: Prudy Brownie, DO;  Location: [facility] ENDOSCOPY;  Service: Pulmonary;;   IVC FILTER REMOVAL N/A 03/24/2019   Procedure: IVC FILTER REMOVAL;  Surgeon: Adine Hoof, MD;  Location: [facility] INVASIVE CV LAB;  Service: Cardiovascular;  Laterality: N/A;   LACRIMAL TUBE INSERTION Right 05/12/2021   Procedure: RIGHT NASOLACRIMAL DUCT PROBE WITH MINI MONOKA STENT PLACEMENT;  Surgeon: Debbra Fairy, MD;  Location: [facility] OR;  Service: Ophthalmology;  Laterality: Right;   LESION EXCISION WITH COMPLEX REPAIR N/A 05/12/2021   Procedure: NASAL LESION EXCISION;  Surgeon: Debbra Fairy, MD;  Location: [facility] OR;  Service: Ophthalmology;  Laterality: N/A;   LOWER EXTREMITY VENOGRAPHY Left 05/26/2019   Procedure: LOWER EXTREMITY VENOGRAPHY;  Surgeon: Adine Hoof, MD;  Location: [facility] INVASIVE CV LAB;  Service: Cardiovascular;  Laterality: Left;   MULTIPLE TOOTH EXTRACTIONS     NASAL SINUS SURGERY     PERIPHERAL VASCULAR INTERVENTION Left 05/26/2019   Procedure: PERIPHERAL VASCULAR INTERVENTION;  Surgeon: Adine Hoof, MD;  Location: [facility] INVASIVE CV LAB;  Service: Cardiovascular;  Laterality: Left;  lower extremity veins   PERIPHERAL VASCULAR THROMBECTOMY  05/26/2019   Procedure: PERIPHERAL VASCULAR THROMBECTOMY;  Surgeon: Adine Hoof, MD;  Location: [facility] INVASIVE CV LAB;  Service: Cardiovascular;;   PTOSIS REPAIR Bilateral 05/12/2021   Procedure: EXTERNAL PTOSIS REPAIR LEVATOR ADVANCEDMENT/RESECTION OF BILATERAL UPPER EYE LIDS;  Surgeon: Debbra Fairy, MD;  Location: [facility] OR;  Service: Ophthalmology;  Laterality: Bilateral;   TUBAL LIGATION     VENA CAVA FILTER PLACEMENT N/A  01/13/2019   Procedure: INSERTION VENA-CAVA FILTER;  Surgeon: Adine Hoof, MD;  Location: [facility] OR;  Service: Vascular;  Laterality: N/A;   VIDEO BRONCHOSCOPY N/A 01/24/2022   Procedure: VIDEO BRONCHOSCOPY WITHOUT FLUORO;  Surgeon: Marine Sia, MD;  Location: [facility] ENDOSCOPY;  Service: Cardiopulmonary;  Laterality: N/A;    Family History  Problem Relation Age of Onset   Diabetes Mother    Coronary artery disease Mother    Emphysema Father    Cancer Sister    Cancer Sister    Cancer Brother    Coronary artery disease Brother     Social History   Occupational History   Occupation: Disabled  Tobacco Use   Smoking status: Former    Current packs/day: 0.00    Average packs/day: 0.3 packs/day for 40.0 years (10.0 ttl pk-yrs)    Types: Cigarettes    Start date: 63    Quit date: 2000    Years since quitting: 25.4   Smokeless tobacco: Never  Vaping Use   Vaping status: Never Used  Substance and Sexual Activity   Alcohol use: No   Drug use: No   Sexual activity: Not on file     Physical Exam: Blood pressure (!) 147/72, pulse  75, height 5\' 6"  (1.676 m), weight 172 lb 6.4 oz (78.2 kg), SpO2 90%.  Gen:      No acute distress, chronically ill on nasal cannula ENT:  no blood, no nasal polyps, mucus membranes moist Lungs:    No increased respiratory effort, diminished, intermittent wheeze CV:         Regular rate and rhythm; no murmurs, rubs, or gallops.  No pedal edema   Data Reviewed: Imaging: I have personally reviewed the CT Chest May 2025 - PE study negative, stable RLL spiculated nodule, severe emphysema, centrilobular  PFTs:     Latest Ref Rng & Units 11/15/2022    8:23 AM  PFT Results  FVC-Pre L 1.72   FVC-Predicted Pre % 59   FVC-Post L 2.01   FVC-Predicted Post % 69   Pre FEV1/FVC % % 38   Post FEV1/FCV % % 36   FEV1-Pre L 0.65   FEV1-Predicted Pre % 29   FEV1-Post L 0.72   DLCO uncorrected ml/min/mmHg 7.38   DLCO UNC% % 36   DLCO corrected  ml/min/mmHg 7.31   DLCO COR %Predicted % 36   DLVA Predicted % 49   TLC L 6.16   TLC % Predicted % 115   RV % Predicted % 171    I have personally reviewed the patient's PFTs and very severe airflow limitation.   Labs: Lab Results  Component Value Date   WBC 7.8 10/17/2023   HGB 10.5 (L) 10/17/2023   HCT 36.0 10/17/2023   MCV 82.8 10/17/2023   PLT 348 10/17/2023  \ Immunization status: Immunization History  Administered Date(s) Administered   Fluad Quad(high Dose 65+) 01/10/2021   Influenza Split 02/27/2011, 02/27/2012, 02/26/2013, 02/26/2014, 02/27/2016, 03/25/2017   Influenza Whole 02/07/2009   Influenza, High Dose Seasonal PF 03/17/2014, 02/27/2017, 03/27/2018   Influenza, Quadrivalent, Recombinant, Inj, Pf 02/25/2018, 04/08/2019, 03/05/2020, 02/11/2021, 02/27/2023   Influenza,inj,Quad PF,6+ Mos 03/29/2022   Influenza-Unspecified 01/27/2013, 03/05/2015, 03/05/2020   PFIZER(Purple Top)SARS-COV-2 Vaccination 07/12/2019, 08/06/2019, 10/06/2019   Tdap 10/07/2013, 04/23/2022    External Records Personally Reviewed: pulmonary, oncology, hospital stay  Assessment:  Gold stage 4 COPD Chronic respiratory failure with hypoxemia and suspicion for hypercapnia Hemoptysis Chronic DVT/PE on life long eliquis  SCC of the lung, right, s/p SBRT in Nov 2024   Plan/Recommendations:   It was a pleasure to meet you today.  I am referring you to pulmonary rehab which is the structured exercise program for patients with chronic lung disease.  They will call you to schedule.  Continue the Breztri , 2 puffs twice daily.  Continue the albuterol  nebulizer needs.  Go ahead and start the Ohytuvayre the new nebulized medication to see if it helps your breathing.  I am not surprised if you cough up small amounts, coin sized, of blood given the fact that you have chronic lung disease and are on Eliquis  blood thinner.  However if it increases in frequency or amount, such as spoonfuls or cupfuls,  You need to let us  know soon as possible.  In the future we could also consider trying to qualify you for a BiPAP machine for COPD.  This would involve an overnight oximetry test as well as an ABG which is a blood test to see if your body is retaining carbon dioxide.   Discussed referral to [facility] for EBV evaluation but I suspect due to her diffuse emphysema she is probably not a strong candidate for LVRS.  Taper off prednisone  to go to 5 mg  every other day for the next month, then discontinue.   I spent 40 minutes on 10/29/2023 in care of this patient including face to face time and non-face to face time spent charting, review of outside records, and coordination of care.  Return to Care: Return in about 3 months (around 01/29/2024).   Louie Rover, MD Pulmonary and Critical Care Medicine [facility] Office:[phone]

## 2023-10-29 NOTE — Telephone Encounter (Signed)
 Submitted a Prior Authorization request to [facility] ADVANTAGE/RX ADVANCE for OHTUVAYRE  via CoverMyMeds. Will update once we receive a response.  Key: [id number]

## 2023-10-29 NOTE — Telephone Encounter (Signed)
 Received notification from [employer] regarding a prior authorization for OHTUVAYRE . Authorization has been APPROVED from 10/29/23 to 05/28/24. Approval letter sent to scan center.  Authorization # [id number]  Geraldene Kleine, PharmD, MPH, BCPS, CPP Clinical Pharmacist (Rheumatology and Pulmonology)

## 2023-10-30 ENCOUNTER — Telehealth (HOSPITAL_BASED_OUTPATIENT_CLINIC_OR_DEPARTMENT_OTHER): Payer: Self-pay

## 2023-10-30 NOTE — Telephone Encounter (Signed)
 Copied from CRM [id number]. Topic: Clinical - Prescription Issue >> Oct 26, 2023  4:04 PM Tyronne Galloway wrote: Reason for CRM: Pt's prescription from NP Tammy Parrett for Ensifentrine  (OHTUVAYRE ) 3 MG/2.5ML SUSP was sent to [facility] DOB is listed wrong on the 2nd page. Edwina Gram called from [place] [facility] to report they need the 2nd page with the corrected DOB. Fax number [phone]. >> Oct 30, 2023 11:22 AM Jethro Morrison wrote: LISA CALLING BACK FROM [place] CALLING ABOUT THE INCORRECT DOB ON PRESCRIPTION AND WAITING FOR THE NEW CORRECT PRESCRIPTION.

## 2023-10-30 NOTE — Telephone Encounter (Signed)
 The birthday has been corrected and form refaxed for Ohtuvayre  to [facility]  Phone#: [phone] Fax#: [phone]

## 2023-11-01 ENCOUNTER — Telehealth (HOSPITAL_COMMUNITY): Payer: Self-pay

## 2023-11-01 ENCOUNTER — Encounter (HOSPITAL_COMMUNITY): Payer: Self-pay

## 2023-11-01 NOTE — Telephone Encounter (Signed)
 Pt insurance is active and benefits verified through [employer] adv Co-pay $15, DED 0/0 met, out of pocket $3,400/$289.47 met, co-insurance 0%. [id number] not covered; no pre-authorization required, Sha[employer]a/[employer] 11/01/2023@4 :06, REF# [id number]

## 2023-11-01 NOTE — Telephone Encounter (Signed)
 Attempted to call patient in regards to Pulmonary Rehab - Unable to leave VM. No VM box is setup.   Sent letter

## 2023-11-15 ENCOUNTER — Telehealth (HOSPITAL_COMMUNITY): Payer: Self-pay

## 2023-11-15 NOTE — Telephone Encounter (Signed)
 F/u call, spoke with patient. She states she is not interested in pulmonary rehab at this time. Informed patient if she changes her mind to call us  back and we can reopen it or have her request a new one from her doctor if it's past the 12 month designation.

## 2024-01-14 ENCOUNTER — Encounter: Payer: Self-pay | Admitting: Internal Medicine

## 2024-01-14 ENCOUNTER — Ambulatory Visit: Admitting: Internal Medicine

## 2024-01-14 VITALS — BP 136/70 | HR 72 | Temp 98.7°F | Ht 65.0 in | Wt 172.0 lb

## 2024-01-14 DIAGNOSIS — J4489 Other specified chronic obstructive pulmonary disease: Secondary | ICD-10-CM | POA: Diagnosis not present

## 2024-01-14 DIAGNOSIS — J439 Emphysema, unspecified: Secondary | ICD-10-CM

## 2024-01-14 DIAGNOSIS — J9611 Chronic respiratory failure with hypoxia: Secondary | ICD-10-CM

## 2024-01-14 DIAGNOSIS — C3491 Malignant neoplasm of unspecified part of right bronchus or lung: Secondary | ICD-10-CM | POA: Diagnosis not present

## 2024-01-14 MED ORDER — PREDNISONE 5 MG PO TABS: 5.0000 mg | ORAL_TABLET | Freq: Every day | ORAL | 5 refills | Status: AC

## 2024-01-14 MED ORDER — BREZTRI AEROSPHERE 160-9-4.8 MCG/ACT IN AERO: 2.0000 | INHALATION_SPRAY | Freq: Two times a day (BID) | RESPIRATORY_TRACT | 5 refills | Status: AC

## 2024-01-14 MED ORDER — METHYLPREDNISOLONE ACETATE 80 MG/ML IJ SUSP
80.0000 mg | Freq: Once | INTRAMUSCULAR | Status: AC
Start: 2024-01-14 — End: 2024-01-14
  Administered 2024-01-14: 80 mg via INTRAMUSCULAR

## 2024-01-14 NOTE — Patient Instructions (Signed)
 It was a pleasure to see you today!  Please schedule follow up with myself in 3 months.  If my schedule is not open yet, we will contact you with a reminder closer to that time. Please call [phone] if you haven't heard from us  a month before, and always call us  sooner if issues or concerns arise. You can also send us  a message through MyChart, but but aware that this is not to be used for urgent issues and it may take up to 5-7 days to receive a reply. Please be aware that you will likely be able to view your results before I have a chance to respond to them. Please give us  5 business days to respond to any non-urgent results.    Continue Breztri  2 puffs twice daily Continue albuterol  as needed Continue Eliquis  You are next due for a CT scan in November 2025 for follow-up of the lung cancer.

## 2024-01-14 NOTE — Progress Notes (Unsigned)
 [name]    [id number]    [birth year]  Primary Care Physician:Prevost, Ronal Czar, [name] Date of Appointment: 01/15/2024 Established Patient Visit  Chief complaint:   Chief Complaint  Patient presents with   COPD    Breathing has worsened. Prednisone  and inhaler not helping.   Consult     HPI: [name] is a 83 y.o. woman with gold stage IV COPD on 2-3LNC.  Interval Updates: Here for follow up. Feels like breathing and wheezing have worsened. No fevers or chills. Chronic cough is stable. She does have a flutter valve which she uses prn. She is experiencing early satiety.   She was not able to taper off the prednisone  5 mg daily due to worsening breathing.    Ohtuvayre  she got a 30 day supply but then was told it was $500/month. She couldn't tell a difference between that and the albuterol .   The hemoptysis ended up being some blood in the roof of her mouth. She has followed up with her dentist and it has all since resolved.   I have reviewed the patient's family social and past medical history and updated as appropriate.   Past Medical History:  Diagnosis Date   Allergic rhinitis    Anemia due to GI blood loss    Anxiety    Asthma    Chronic airway obstruction, not elsewhere classified    Depressive disorder, not elsewhere classified    DVT (deep venous thrombosis) (HCC)    LLE DVT 01/13/19   Dyspnea    Esophageal reflux    Essential hypertension 08/27/2019   History of radiation therapy    Right lung-05/15/23-05/21/23- Dr. Lynwood Nasuti   PE (pulmonary thromboembolism) (HCC) 01/29/2019   Pneumonia    Wears partial dentures     Past Surgical History:  Procedure Laterality Date   ABDOMINAL HYSTERECTOMY     ABDOMINAL HYSTERECTOMY     BRONCHIAL BIOPSY  04/09/2023   Procedure: BRONCHIAL BIOPSIES;  Surgeon: Brenna Adine CROME, DO;  Location: [facility];  Service: Pulmonary;;   BRONCHIAL BRUSHINGS  04/09/2023   Procedure: BRONCHIAL BRUSHINGS;   Surgeon: Brenna Adine CROME, DO;  [facility]: [facility];  Service: Pulmonary;;   BRONCHIAL NEEDLE ASPIRATION BIOPSY  04/09/2023   Procedure: BRONCHIAL NEEDLE ASPIRATION BIOPSIES;  Surgeon: Brenna Adine CROME, DO;  [facility]: [facility];  Service: Pulmonary;;   BRONCHIAL WASHINGS  01/24/2022   Procedure: BRONCHIAL WASHINGS;  Surgeon: Alaine Vicenta NOVAK, MD;  Location: [facility];  Service: Cardiopulmonary;;   CATARACT EXTRACTION W/ INTRAOCULAR LENS  IMPLANT, BILATERAL     CHOLECYSTECTOMY N/A 08/12/2021   Procedure: LAPAROSCOPIC CHOLECYSTECTOMY;  Surgeon: Lyndel Deward PARAS, MD;  Location: [facility];  Service: General;  Laterality: N/A;   COLONOSCOPY     COLONOSCOPY WITH PROPOFOL  N/A 08/20/2019   Procedure: COLONOSCOPY WITH PROPOFOL ;  Surgeon: Donnald Charleston, MD;  Location: [facility] ENDOSCOPY;  Service: Endoscopy;  Laterality: N/A;   CORONARY ULTRASOUND/IVUS Left 05/26/2019   Procedure: Intravascular Ultrasound/IVUS;  Surgeon: Sheree Penne Bruckner, MD;  Location: [facility] INVASIVE CV LAB;  Service: Cardiovascular;  Laterality: Left;   CYSTOSCOPY     DILATION AND CURETTAGE OF UTERUS     ECTROPION REPAIR Bilateral 05/12/2021   Procedure: REPAIR OF ECTROPION BY LATERAL TARSAL STRIP OF BILATERAL LOWER EYE LIDS;  Surgeon: Rodgers Faden, MD;  Location: [facility] OR;  Service: Ophthalmology;  Laterality: Bilateral;   ESOPHAGOGASTRODUODENOSCOPY (EGD) WITH PROPOFOL  N/A 08/20/2019   Procedure: ESOPHAGOGASTRODUODENOSCOPY (EGD) WITH PROPOFOL ;  Surgeon: Donnald Charleston, MD;  Location: [facility] ENDOSCOPY;  Service: Endoscopy;  Laterality: N/A;   FIDUCIAL MARKER PLACEMENT  04/09/2023   Procedure: FIDUCIAL MARKER PLACEMENT;  Surgeon: Brenna Adine CROME, DO;  [facility];  Service: Pulmonary;;   HEMOSTASIS CONTROL  08/20/2019   Procedure: HEMOSTASIS CONTROL;  Surgeon: Donnald Charleston, MD;  Location: [facility] ENDOSCOPY;  Service: Endoscopy;;   HEMOSTASIS CONTROL  04/09/2023   Procedure: HEMOSTASIS CONTROL;  Surgeon: Brenna Adine CROME, DO;  [facility]: [facility];  Service: Pulmonary;;   IVC FILTER REMOVAL N/A 03/24/2019   Procedure: IVC FILTER REMOVAL;  Surgeon: Sheree Penne Bruckner, MD;  Location: [facility] INVASIVE CV LAB;  Service: Cardiovascular;  Laterality: N/A;   LACRIMAL TUBE INSERTION Right 05/12/2021   Procedure: RIGHT NASOLACRIMAL DUCT PROBE WITH MINI MONOKA STENT PLACEMENT;  Surgeon: Rodgers Faden, MD;  Location: [facility] OR;  Service: Ophthalmology;  Laterality: Right;   LESION EXCISION WITH COMPLEX REPAIR N/A 05/12/2021   Procedure: NASAL LESION EXCISION;  Surgeon: Rodgers Faden, MD;  Location: [facility] OR;  Service: Ophthalmology;  Laterality: N/A;   LOWER EXTREMITY VENOGRAPHY Left 05/26/2019   Procedure: LOWER EXTREMITY VENOGRAPHY;  Surgeon: Sheree Penne Bruckner, MD;  Location: [facility] INVASIVE CV LAB;  Service: Cardiovascular;  Laterality: Left;   MULTIPLE TOOTH EXTRACTIONS     NASAL SINUS SURGERY     PERIPHERAL VASCULAR INTERVENTION Left 05/26/2019   Procedure: PERIPHERAL VASCULAR INTERVENTION;  Surgeon: Sheree Penne Bruckner, MD;  Location: [facility] INVASIVE CV LAB;  Service: Cardiovascular;  Laterality: Left;  lower extremity veins   PERIPHERAL VASCULAR THROMBECTOMY  05/26/2019   Procedure: PERIPHERAL VASCULAR THROMBECTOMY;  Surgeon: Sheree Penne Bruckner, MD;  Location: [facility] INVASIVE CV LAB;  Service: Cardiovascular;;   PTOSIS REPAIR Bilateral 05/12/2021   Procedure: EXTERNAL PTOSIS REPAIR LEVATOR ADVANCEDMENT/RESECTION OF BILATERAL UPPER EYE LIDS;  Surgeon: Rodgers Faden, MD;  Location: [facility] OR;  Service: Ophthalmology;  Laterality: Bilateral;   TUBAL LIGATION     VENA CAVA FILTER PLACEMENT N/A 01/13/2019   Procedure: INSERTION VENA-CAVA FILTER;  Surgeon: Sheree Penne Bruckner, MD;  Location: [facility] OR;  Service: Vascular;  Laterality: N/A;   VIDEO BRONCHOSCOPY N/A 01/24/2022   Procedure: VIDEO BRONCHOSCOPY WITHOUT FLUORO;  Surgeon: Alaine Vicenta NOVAK, MD;  Location: [facility] ENDOSCOPY;  Service: Cardiopulmonary;   Laterality: N/A;    Family History  Problem Relation Age of Onset   Diabetes Mother    Coronary artery disease Mother    Emphysema Father    Cancer Sister    Cancer Sister    Cancer Brother    Coronary artery disease Brother     Social History   Occupational History   Occupation: Disabled  Tobacco Use   Smoking status: Former    Current packs/day: 0.00    Average packs/day: 0.3 packs/day for 40.0 years (10.0 ttl pk-yrs)    Types: Cigarettes    Start date: 31    Quit date: 2000    Years since quitting: 25.6   Smokeless tobacco: Never  Vaping Use   Vaping status: Never Used  Substance and Sexual Activity   Alcohol use: No   Drug use: No   Sexual activity: Not on file     Physical Exam: Blood pressure 136/70, pulse 72, temperature 98.7 F (37.1 C), temperature source Oral, height 5' 5 (1.651 m), weight 172 lb (78 kg), SpO2 97%.  Gen:      No acute distress, chornically ill, fatigued, on nasal cannul ENT:  no nasal polyps, mucus membranes moist Lungs:  diminished, transmitted wheeze best ausculated over the neck with forced expiration CV:         Regular rate and rhythm; no murmurs, rubs, or gallops.  No pedal edema   Data Reviewed: Imaging: I have personally reviewed the CTPE May 2025 negative for PE.  Emphysema.stable RLL nodule  PFTs:     Latest Ref Rng & Units 11/15/2022    8:23 AM  PFT Results  FVC-Pre L 1.72   FVC-Predicted Pre % 59   FVC-Post L 2.01   FVC-Predicted Post % 69   Pre FEV1/FVC % % 38   Post FEV1/FCV % % 36   FEV1-Pre L 0.65   FEV1-Predicted Pre % 29   FEV1-Post L 0.72   DLCO uncorrected ml/min/mmHg 7.38   DLCO UNC% % 36   DLCO corrected ml/min/mmHg 7.31   DLCO COR %Predicted % 36   DLVA Predicted % 49   TLC L 6.16   TLC % Predicted % 115   RV % Predicted % 171    I have personally reviewed the patient's PFTs and severe COPD FEV1 29% of predicted  Labs: Lab Results  Component Value Date   NA 141 10/17/2023   K 3.8  10/17/2023   CO2 30 10/17/2023   GLUCOSE 116 (H) 10/17/2023   BUN 23 10/17/2023   CREATININE 0.84 10/17/2023   CALCIUM  9.4 10/17/2023   GFR 61.89 02/02/2022   GFRNONAA >60 10/17/2023   Lab Results  Component Value Date   WBC 7.8 10/17/2023   HGB 10.5 (L) 10/17/2023   HCT 36.0 10/17/2023   MCV 82.8 10/17/2023   PLT 348 10/17/2023    Immunization status: Immunization History  Administered Date(s) Administered   Fluad Quad(high Dose 65+) 01/10/2021   Influenza Split 02/27/2011, 02/27/2012, 02/26/2013, 02/26/2014, 02/27/2016, 03/25/2017   Influenza Whole 02/07/2009   Influenza, High Dose Seasonal PF 03/17/2014, 02/27/2017, 03/27/2018   Influenza, Quadrivalent, Recombinant, Inj, Pf 02/25/2018, 04/08/2019, 03/05/2020, 02/11/2021, 02/27/2023   Influenza,inj,Quad PF,6+ Mos 03/29/2022   Influenza-Unspecified 01/27/2013, 03/05/2015, 03/05/2020   PFIZER(Purple Top)SARS-COV-2 Vaccination 07/12/2019, 08/06/2019, 10/06/2019   Tdap 10/07/2013, 04/23/2022    External Records Personally Reviewed: pulmonary, oncology  Assessment:  Gold stage 4 COPD with exacerbation Chronic respiratory failure with hypoxemia and suspicion for hypercapnia Chronic DVT/PE on life long eliquis  SCC of the lung, right, s/p SBRT in Nov 2024   Plan/Recommendations:  Continue Breztri  2 puffs twice daily Continue albuterol  as needed Continue Eliquis  You are next due for a CT scan in November 2025 for follow-up of the lung cancer.  We discussed getting an ABG and an ONO on 2 L to qualify for BiPAP.  She does not think she be able to tolerate the mask. She does not feel like leaving the house for pulmonary rehab.  Her symptoms are refractory to most medication.  She is continuing her chronic prednisone  5 mg daily.  We tried tapering off and she was unable to tolerate due to worsening breathing.  She is having some upper airway wheezing today she is wondering if a steroid shot would help her feel  better.  Depo-Medrol  shot in the office today.  I think it is likely she is approaching the point where her COPD is progressing beyond medical interventions.  We may need to engage with palliative care for symptom management in the near future.   Return to Care: Return in about 3 months (around 04/15/2024).   Verdon Gore, MD Pulmonary and Critical Care Medicine [facility] Office:[phone]

## 2024-02-01 ENCOUNTER — Ambulatory Visit: Admitting: Cardiology

## 2024-02-07 ENCOUNTER — Encounter: Payer: Self-pay | Admitting: Internal Medicine

## 2024-02-07 ENCOUNTER — Ambulatory Visit (INDEPENDENT_AMBULATORY_CARE_PROVIDER_SITE_OTHER): Admitting: Internal Medicine

## 2024-02-07 VITALS — BP 130/86 | HR 89 | Temp 98.0°F | Ht 66.0 in | Wt 167.0 lb

## 2024-02-07 DIAGNOSIS — J9611 Chronic respiratory failure with hypoxia: Secondary | ICD-10-CM | POA: Diagnosis not present

## 2024-02-07 DIAGNOSIS — J441 Chronic obstructive pulmonary disease with (acute) exacerbation: Secondary | ICD-10-CM

## 2024-02-07 DIAGNOSIS — Z87891 Personal history of nicotine dependence: Secondary | ICD-10-CM

## 2024-02-07 DIAGNOSIS — Z23 Encounter for immunization: Secondary | ICD-10-CM | POA: Diagnosis not present

## 2024-02-07 DIAGNOSIS — J9610 Chronic respiratory failure, unspecified whether with hypoxia or hypercapnia: Secondary | ICD-10-CM

## 2024-02-07 DIAGNOSIS — J4489 Other specified chronic obstructive pulmonary disease: Secondary | ICD-10-CM

## 2024-02-07 NOTE — Patient Instructions (Addendum)
 It was a pleasure to see you today!  Please schedule follow up with myself or APP in 3 months.  If my schedule is not open yet, we will contact you with a reminder closer to that time. Please call [phone] if you haven't heard from us  a month before, and always call us  sooner if issues or concerns arise. You can also send us  a message through MyChart, but but aware that this is not to be used for urgent issues and it may take up to 5-7 days to receive a reply. Please be aware that you will likely be able to view your results before I have a chance to respond to them. Please give us  5 business days to respond to any non-urgent results.   Continue Breztri  2 puffs twice daily Continue albuterol  as needed Continue Eliquis 

## 2024-02-07 NOTE — Addendum Note (Signed)
 Addended by: MOODY RANCHER on: 02/07/2024 04:46 PM   Modules accepted: Orders

## 2024-02-07 NOTE — Progress Notes (Signed)
 [name]    [id number]    11-18-1940  Primary Care Physician:Prevost, Ronal Czar, [name] Date of Appointment: 02/07/2024 Established Patient Visit  Chief complaint:   Chief Complaint  Patient presents with   COPD    Patient wants to speak to the doctor about getting a scooter and a small POC   Shortness of Breath    Patient states her breathing is alright.     HPI: [name] is a 83 y.o. woman with gold stage IV COPD on 2-3LNC.  Interval Updates: Here for follow up. Chonic cough and dyspnea. Still  on 5 mg predisone daily. No interval exacerbations. Did not improve with ohtuvayre .  Wants a motorized scooter.   I have reviewed the patient's family social and past medical history and updated as appropriate.   Past Medical History:  Diagnosis Date   Allergic rhinitis    Anemia due to GI blood loss    Anxiety    Asthma    Chronic airway obstruction, not elsewhere classified    Depressive disorder, not elsewhere classified    DVT (deep venous thrombosis) (HCC)    LLE DVT 01/13/19   Dyspnea    Esophageal reflux    Essential hypertension 08/27/2019   History of radiation therapy    Right lung-05/15/23-05/21/23- Dr. Lynwood Nasuti   PE (pulmonary thromboembolism) (HCC) 01/29/2019   Pneumonia    Wears partial dentures     Past Surgical History:  Procedure Laterality Date   ABDOMINAL HYSTERECTOMY     ABDOMINAL HYSTERECTOMY     BRONCHIAL BIOPSY  04/09/2023   Procedure: BRONCHIAL BIOPSIES;  Surgeon: Brenna Adine CROME, DO;  Location: [facility] ENDOS[facility];  Service: Pulmonary;;   BRONCHIAL BRUSHINGS  04/09/2023   Procedure: BRONCHIAL BRUSHINGS;  Surgeon: Brenna Adine CROME, DO;  Location: [facility];  Service: Pulmonary;;   BRONCHIAL NEEDLE ASPIRATION BIOPSY  04/09/2023   Procedure: BRONCHIAL NEEDLE ASPIRATION BIOPSIES;  Surgeon: Brenna Adine CROME, DO;  Location: [facility];  Service: Pulmonary;;   BRONCHIAL WASHINGS  01/24/2022   Procedure: BRONCHIAL WASHINGS;   Surgeon: Alaine Vicenta NOVAK, MD;  Location: [facility];  Service: Cardiopulmonary;;   CATARACT EXTRACTION W/ INTRAOCULAR LENS  IMPLANT, BILATERAL     CHOLECYSTECTOMY N/A 08/12/2021   Procedure: LAPAROSCOPIC CHOLECYSTECTOMY;  Surgeon: Lyndel Deward PARAS, MD;  Location: [facility] ORS;  Service: General;  Laterality: N/A;   COLONOSCOPY     COLONOSCOPY WITH PROPOFOL  N/A 08/20/2019   Procedure: COLONOSCOPY WITH PROPOFOL ;  Surgeon: Donnald Charleston, MD;  Location: [facility] ENDOSCOPY;  Service: Endoscopy;  Laterality: N/A;   CORONARY ULTRASOUND/IVUS Left 05/26/2019   Procedure: Intravascular Ultrasound/IVUS;  Surgeon: Sheree Penne Bruckner, MD;  Location: [facility] INVASIVE CV LAB;  Service: Cardiovascular;  Laterality: Left;   CYSTOSCOPY     DILATION AND CURETTAGE OF UTERUS     ECTROPION REPAIR Bilateral 05/12/2021   Procedure: REPAIR OF ECTROPION BY LATERAL TARSAL STRIP OF BILATERAL LOWER EYE LIDS;  Surgeon: Rodgers Faden, MD;  Location: [facility] OR;  Service: Ophthalmology;  Laterality: Bilateral;   ESOPHAGOGASTRODUODENOSCOPY (EGD) WITH PROPOFOL  N/A 08/20/2019   Procedure: ESOPHAGOGASTRODUODENOSCOPY (EGD) WITH PROPOFOL ;  Surgeon: Donnald Charleston, MD;  Location: [facility] ENDOSCOPY;  Service: Endoscopy;  Laterality: N/A;   FIDUCIAL MARKER PLACEMENT  04/09/2023   Procedure: FIDUCIAL MARKER PLACEMENT;  Surgeon: Brenna Adine CROME, DO;  Location: [facility];  Service: Pulmonary;;   HEMOSTASIS CONTROL  08/20/2019   Procedure: HEMOSTASIS CONTROL;  Surgeon: Donnald Charleston, MD;  Location: [facility];  Service: Endoscopy;;   HEMOSTASIS CONTROL  04/09/2023   Procedure: HEMOSTASIS CONTROL;  Surgeon: Brenna Adine CROME, DO;  Location: [facility];  Service: Pulmonary;;   IVC FILTER REMOVAL N/A 03/24/2019   Procedure: IVC FILTER REMOVAL;  Surgeon: Sheree Penne Bruckner, MD;  Location: [facility] INVASIVE CV LAB;  Service: Cardiovascular;  Laterality: N/A;   LACRIMAL TUBE INSERTION Right 05/12/2021   Procedure: RIGHT NASOLACRIMAL DUCT  PROBE WITH MINI MONOKA STENT PLACEMENT;  Surgeon: Rodgers Faden, MD;  Location: [facility] OR;  Service: Ophthalmology;  Laterality: Right;   LESION EXCISION WITH COMPLEX REPAIR N/A 05/12/2021   Procedure: NASAL LESION EXCISION;  Surgeon: Rodgers Faden, MD;  Location: [facility] OR;  Service: Ophthalmology;  Laterality: N/A;   LOWER EXTREMITY VENOGRAPHY Left 05/26/2019   Procedure: LOWER EXTREMITY VENOGRAPHY;  Surgeon: Sheree Penne Bruckner, MD;  Location: [facility] INVASIVE CV LAB;  Service: Cardiovascular;  Laterality: Left;   MULTIPLE TOOTH EXTRACTIONS     NASAL SINUS SURGERY     PERIPHERAL VASCULAR INTERVENTION Left 05/26/2019   Procedure: PERIPHERAL VASCULAR INTERVENTION;  Surgeon: Sheree Penne Bruckner, MD;  Location: [facility] INVASIVE CV LAB;  Service: Cardiovascular;  Laterality: Left;  lower extremity veins   PERIPHERAL VASCULAR THROMBECTOMY  05/26/2019   Procedure: PERIPHERAL VASCULAR THROMBECTOMY;  Surgeon: Sheree Penne Bruckner, MD;  Location: [facility] INVASIVE CV LAB;  Service: Cardiovascular;;   PTOSIS REPAIR Bilateral 05/12/2021   Procedure: EXTERNAL PTOSIS REPAIR LEVATOR ADVANCEDMENT/RESECTION OF BILATERAL UPPER EYE LIDS;  Surgeon: Rodgers Faden, MD;  Location: [facility] OR;  Service: Ophthalmology;  Laterality: Bilateral;   TUBAL LIGATION     VENA CAVA FILTER PLACEMENT N/A 01/13/2019   Procedure: INSERTION VENA-CAVA FILTER;  Surgeon: Sheree Penne Bruckner, MD;  Location: [facility] OR;  Service: Vascular;  Laterality: N/A;   VIDEO BRONCHOSCOPY N/A 01/24/2022   Procedure: VIDEO BRONCHOSCOPY WITHOUT FLUORO;  Surgeon: Alaine Vicenta NOVAK, MD;  Location: [facility] ENDOSCOPY;  Service: Cardiopulmonary;  Laterality: N/A;    Family History  Problem Relation Age of Onset   Diabetes Mother    Coronary artery disease Mother    Emphysema Father    Cancer Sister    Cancer Sister    Cancer Brother    Coronary artery disease Brother     Social History   Occupational History   Occupation: Disabled  Tobacco Use    Smoking status: Former    Current packs/day: 0.00    Average packs/day: 0.3 packs/day for 40.0 years (10.0 ttl pk-yrs)    Types: Cigarettes    Start date: 38    Quit date: 2000    Years since quitting: 25.7   Smokeless tobacco: Never  Vaping Use   Vaping status: Never Used  Substance and Sexual Activity   Alcohol use: No   Drug use: No   Sexual activity: Not on file     Physical Exam: Blood pressure 130/86, pulse 89, temperature 98 F (36.7 C), temperature source Oral, height 5' 6 (1.676 m), weight 167 lb (75.8 kg), SpO2 91%.  Gen:      No acute distress, chornically ill, fatigued, on nasal cannul ENT:  no nasal polyps, mucus membranes moist Lungs:    diminished, transmitted wheeze best ausculated over the neck with forced expiration CV:         Regular rate and rhythm; no murmurs, rubs, or gallops.  No pedal edema   Data Reviewed: Imaging: I have personally reviewed the CTPE May 2025 negative for PE.  Emphysema.stable RLL nodule  PFTs:     Latest Ref  Rng & Units 11/15/2022    8:23 AM  PFT Results  FVC-Pre L 1.72   FVC-Predicted Pre % 59   FVC-Post L 2.01   FVC-Predicted Post % 69   Pre FEV1/FVC % % 38   Post FEV1/FCV % % 36   FEV1-Pre L 0.65   FEV1-Predicted Pre % 29   FEV1-Post L 0.72   DLCO uncorrected ml/min/mmHg 7.38   DLCO UNC% % 36   DLCO corrected ml/min/mmHg 7.31   DLCO COR %Predicted % 36   DLVA Predicted % 49   TLC L 6.16   TLC % Predicted % 115   RV % Predicted % 171    I have personally reviewed the patient's PFTs and severe COPD FEV1 29% of predicted  Labs: Lab Results  Component Value Date   NA 141 10/17/2023   K 3.8 10/17/2023   CO2 30 10/17/2023   GLUCOSE 116 (H) 10/17/2023   BUN 23 10/17/2023   CREATININE 0.84 10/17/2023   CALCIUM  9.4 10/17/2023   GFR 61.89 02/02/2022   GFRNONAA >60 10/17/2023   Lab Results  Component Value Date   WBC 7.8 10/17/2023   HGB 10.5 (L) 10/17/2023   HCT 36.0 10/17/2023   MCV 82.8 10/17/2023    PLT 348 10/17/2023    Immunization status: Immunization History  Administered Date(s) Administered   Fluad Quad(high Dose 65+) 01/10/2021   INFLUENZA, HIGH DOSE SEASONAL PF 03/17/2014, 02/27/2017, 03/27/2018   Influenza Split 02/27/2011, 02/27/2012, 02/26/2013, 02/26/2014, 02/27/2016, 03/25/2017   Influenza Whole 02/07/2009   Influenza, Quadrivalent, Recombinant, Inj, Pf 02/25/2018, 04/08/2019, 03/05/2020, 02/11/2021, 02/27/2023   Influenza,inj,Quad PF,6+ Mos 03/29/2022   Influenza-Unspecified 01/27/2013, 03/05/2015, 03/05/2020   PFIZER(Purple Top)SARS-COV-2 Vaccination 07/12/2019, 08/06/2019, 10/06/2019   Tdap 10/07/2013, 04/23/2022    External Records Personally Reviewed: pulmonary, oncology  Assessment:  Gold stage 4 COPD with exacerbation Chronic respiratory failure with hypoxemia and suspicion for hypercapnia Chronic DVT/PE on life long eliquis  SCC of the lung, right, s/p SBRT in Nov 2024   Plan/Recommendations:  Continue Breztri  2 puffs twice daily Continue albuterol  as needed Continue Eliquis   We discussed getting an ABG and an ONO on 2 L to qualify for BiPAP.  She does not think she be able to tolerate the mask. She does not feel like leaving the house for pulmonary rehab.  Her symptoms are refractory to most medication.  She is continuing her chronic prednisone  5 mg daily.  We tried tapering off and she was unable to tolerate due to worsening breathing. I think it is likely she is approaching the point where her COPD is progressing beyond medical interventions.  We may need to engage with palliative care for symptom management in the near future.  [name] is unable to do her daily activities of living without significant dyspnea.  She does not have the upper body strength to use a manual wheelchair.  She does not have the balance or strength to use a walker or cane.  She requires a motorized wheelchair for primarily at home use.  This is due to her severe dyspnea from  COPD.  Flu shot today. Covid vaccine rx declined.   Return to Care: Return in about 3 months (around 05/08/2024).   Verdon Gore, MD Pulmonary and Critical Care Medicine [facility] Office:[phone]

## 2024-02-07 NOTE — Addendum Note (Signed)
 Addended by: MOODY RANCHER on: 02/07/2024 05:19 PM   Modules accepted: Orders

## 2024-02-15 DIAGNOSIS — K219 Gastro-esophageal reflux disease without esophagitis: Secondary | ICD-10-CM | POA: Diagnosis not present

## 2024-02-15 DIAGNOSIS — R079 Chest pain, unspecified: Secondary | ICD-10-CM | POA: Diagnosis not present

## 2024-02-20 ENCOUNTER — Other Ambulatory Visit: Payer: Self-pay

## 2024-02-20 ENCOUNTER — Emergency Department (HOSPITAL_BASED_OUTPATIENT_CLINIC_OR_DEPARTMENT_OTHER)
Admission: EM | Admit: 2024-02-20 | Discharge: 2024-02-20 | Disposition: A | Discharge status: Home / Self Care | Attending: Emergency Medicine | Admitting: Emergency Medicine

## 2024-02-20 ENCOUNTER — Emergency Department (HOSPITAL_BASED_OUTPATIENT_CLINIC_OR_DEPARTMENT_OTHER): Admitting: Radiology

## 2024-02-20 DIAGNOSIS — Z7901 Long term (current) use of anticoagulants: Secondary | ICD-10-CM | POA: Diagnosis not present

## 2024-02-20 DIAGNOSIS — M79642 Pain in left hand: Secondary | ICD-10-CM | POA: Diagnosis not present

## 2024-02-20 DIAGNOSIS — R0602 Shortness of breath: Secondary | ICD-10-CM | POA: Diagnosis not present

## 2024-02-20 DIAGNOSIS — J441 Chronic obstructive pulmonary disease with (acute) exacerbation: Secondary | ICD-10-CM | POA: Insufficient documentation

## 2024-02-20 DIAGNOSIS — S6992XA Unspecified injury of left wrist, hand and finger(s), initial encounter: Secondary | ICD-10-CM | POA: Diagnosis not present

## 2024-02-20 DIAGNOSIS — M79643 Pain in unspecified hand: Secondary | ICD-10-CM

## 2024-02-20 DIAGNOSIS — S20212A Contusion of left front wall of thorax, initial encounter: Secondary | ICD-10-CM | POA: Diagnosis not present

## 2024-02-20 DIAGNOSIS — S299XXA Unspecified injury of thorax, initial encounter: Secondary | ICD-10-CM | POA: Diagnosis present

## 2024-02-20 DIAGNOSIS — S40012A Contusion of left shoulder, initial encounter: Secondary | ICD-10-CM | POA: Diagnosis not present

## 2024-02-20 DIAGNOSIS — W010XXA Fall on same level from slipping, tripping and stumbling without subsequent striking against object, initial encounter: Secondary | ICD-10-CM | POA: Insufficient documentation

## 2024-02-20 DIAGNOSIS — M25532 Pain in left wrist: Secondary | ICD-10-CM | POA: Diagnosis not present

## 2024-02-20 DIAGNOSIS — R0781 Pleurodynia: Secondary | ICD-10-CM | POA: Diagnosis not present

## 2024-02-20 DIAGNOSIS — W19XXXA Unspecified fall, initial encounter: Secondary | ICD-10-CM

## 2024-02-20 DIAGNOSIS — M1812 Unilateral primary osteoarthritis of first carpometacarpal joint, left hand: Secondary | ICD-10-CM | POA: Diagnosis not present

## 2024-02-20 DIAGNOSIS — S6982XA Other specified injuries of left wrist, hand and finger(s), initial encounter: Secondary | ICD-10-CM | POA: Diagnosis not present

## 2024-02-20 LAB — BASIC METABOLIC PANEL WITH GFR
Anion gap: 12 (ref 5–15)
BUN: 23 mg/dL (ref 8–23)
CO2: 26 mmol/L (ref 22–32)
Calcium: 9.3 mg/dL (ref 8.9–10.3)
Chloride: 102 mmol/L (ref 98–111)
Creatinine, Ser: 0.78 mg/dL (ref 0.44–1.00)
GFR, Estimated: >60 mL/min (ref >60)
Glucose, Bld: 117 mg/dL — ABNORMAL HIGH (ref 70–99)
Potassium: 3.8 mmol/L (ref 3.5–5.1)
Sodium: 140 mmol/L (ref 135–145)

## 2024-02-20 LAB — CBC
HCT: 32.2 % — ABNORMAL LOW (ref 36.0–46.0)
Hemoglobin: 9 g/dL — ABNORMAL LOW (ref 12.0–15.0)
MCH: 19.6 pg — ABNORMAL LOW (ref 26.0–34.0)
MCHC: 28 g/dL — ABNORMAL LOW (ref 30.0–36.0)
MCV: 70 fL — ABNORMAL LOW (ref 80.0–100.0)
Platelets: 334 K/uL (ref 150–400)
RBC: 4.6 MIL/uL (ref 3.87–5.11)
RDW: 19 % — ABNORMAL HIGH (ref 11.5–15.5)
WBC: 11.2 K/uL — ABNORMAL HIGH (ref 4.0–10.5)
nRBC: 0 % (ref 0.0–0.2)

## 2024-02-20 MED ORDER — IPRATROPIUM-ALBUTEROL 0.5-2.5 (3) MG/3ML IN SOLN
6.0000 mL | Freq: Once | RESPIRATORY_TRACT | Status: AC
  Administered 2024-02-20: 6 mL via RESPIRATORY_TRACT
  Filled 2024-02-20: qty 6

## 2024-02-20 MED ORDER — HYDROCODONE-ACETAMINOPHEN 5-325 MG PO TABS
1.0000 | ORAL_TABLET | Freq: Once | ORAL | Status: AC
  Administered 2024-02-20: 1 via ORAL
  Filled 2024-02-20: qty 1

## 2024-02-20 MED ORDER — LIDOCAINE 5 % EX PTCH
2.0000 | MEDICATED_PATCH | CUTANEOUS | Status: DC
  Administered 2024-02-20: 2 via TRANSDERMAL
  Filled 2024-02-20: qty 2

## 2024-02-20 MED ORDER — METHYLPREDNISOLONE SODIUM SUCC 125 MG IJ SOLR
125.0000 mg | INTRAMUSCULAR | Status: AC
  Administered 2024-02-20: 125 mg via INTRAVENOUS
  Filled 2024-02-20: qty 2

## 2024-02-20 MED ORDER — LIDOCAINE 5 % EX PTCH: 1.0000 | MEDICATED_PATCH | CUTANEOUS | 0 refills | Status: AC

## 2024-02-20 MED ORDER — OXYCODONE HCL 5 MG PO TABS: 5.0000 mg | ORAL_TABLET | ORAL | 0 refills | Status: DC | PRN

## 2024-02-20 MED ORDER — IPRATROPIUM-ALBUTEROL 0.5-2.5 (3) MG/3ML IN SOLN: 6.0000 mL | Freq: Once | RESPIRATORY_TRACT | Status: DC

## 2024-02-20 MED ORDER — PREDNISONE 20 MG PO TABS: 40.0000 mg | ORAL_TABLET | Freq: Every day | ORAL | 0 refills | Status: DC

## 2024-02-20 NOTE — Discharge Instructions (Signed)
 You were seen for your rib pain in the emergency department.  It is likely that you have a bruised rib.  May have sprained wrist as well.  At home, please take Tylenol  and use lidocaine  patches we have prescribed you for your pain. You may also take the oxycodone  we have prescribed you for any breakthrough pain that may have.  Do not take this before driving or operating heavy machinery.  Do not take this medication with alcohol.  Please also use the incentive spirometer we have given you to prevent any pneumonias.  Take the steroids and use your inhalers for your COPD.  Continue to wear the splint until you follow-up with sports medicine by your wrist.  Check your MyChart online for the results of any tests that had not resulted by the time you left the emergency department.   Follow-up with your primary doctor in 2-3 days regarding your visit.  Follow-up with sports medicine or orthopedics in 2 weeks.  Return immediately to the emergency department if you experience any of the following: Difficulty breathing, fever, severe pain, or any other concerning symptoms.    Thank you for visiting our Emergency Department. It was a pleasure taking care of you today.

## 2024-02-20 NOTE — ED Triage Notes (Signed)
 Pt POV reporting SOB and hypoxia after mechanical fall yesterday, tripped over oxygen  cords, fell on L side, reporting L hand and rib pain, on Eliquis  denies hitting head.

## 2024-02-20 NOTE — ED Notes (Signed)
 Pt on 2LNC at baseline, Spo2 % in triage, 95% on 5LNC by RT

## 2024-02-20 NOTE — ED Provider Notes (Signed)
 [place] EMERGENCY DEPARTMENT AT [facility] Provider Note   CSN: [id number] Arrival date & time: 02/20/24  1042     Patient presents with: Shortness of Breath   [name] is a 83 y.o. female.   83 year old female with a history of DVT and PE on Eliquis  and COPD on 2 to 3 L home oxygen  who presents emergency department rib pain and wrist pain after a fall.  Tripped over her oxygen  tubing yesterday landing on her left side.  Says she is have been having lots of hand pain and wrist pain on the left side.  Also hit her ribs on the left side on the way down.  No head strike or LOC.  Has been compliant with her Eliquis .  Does not report being sick or having any shortness of breath before the fall but has had some shortness of breath after the fall.  Last tetanus shot was 03/2022  Was wheezing in triage and was given a DuoNeb prior to my evaluation       Prior to Admission medications   Medication Sig Start Date End Date Taking? Authorizing Provider  lidocaine  (LIDODERM ) 5 % Place 1 patch onto the skin daily. Remove & Discard patch within 12 hours or as directed by MD 02/20/24  Yes Yolande Lamar JAYSON, MD  oxyCODONE  (ROXICODONE ) 5 MG immediate release tablet Take 1 tablet (5 mg total) by mouth every 4 (four) hours as needed for severe pain (pain score 7-10). 02/20/24  Yes Yolande Lamar JAYSON, MD  predniSONE  (DELTASONE ) 20 MG tablet Take 2 tablets (40 mg total) by mouth daily. 02/20/24  Yes Yolande Lamar JAYSON, MD  acetaminophen  (TYLENOL ) 500 MG tablet Take 500 mg by mouth every 6 (six) hours as needed for headache (pain).    [provider]  albuterol  (PROVENTIL ) (2.5 MG/3ML) 0.083% nebulizer solution Take 3 mLs (2.5 mg total) by nebulization every 6 (six) hours as needed for wheezing or shortness of breath. 10/11/23   Parrett, Madelin RAMAN, NP  albuterol  (VENTOLIN  HFA) 108 (90 Base) MCG/ACT inhaler Inhale 1-2 puffs into the lungs every 6 (six) hours as needed for wheezing or  shortness of breath.    [provider]  apixaban  (ELIQUIS ) 5 MG TABS tablet Take 1 tablet (5 mg total) by mouth 2 (two) times daily. 05/12/23   Brenna Adine CROME, DO  budesonide -glycopyrrolate -formoterol  (BREZTRI  AEROSPHERE) 160-9-4.8 MCG/ACT AERO inhaler Inhale 2 puffs into the lungs in the morning and at bedtime. 01/14/24   Desai, Nikita S, MD  cetirizine (ZYRTEC) 10 MG tablet Take 10 mg by mouth daily.    [provider]  escitalopram  (LEXAPRO ) 20 MG tablet Take 20 mg by mouth daily.    [provider]  furosemide  (LASIX ) 20 MG tablet Take 20 mg by mouth daily as needed for fluid or edema. Takes when having swelling.    [provider]  LORazepam  (ATIVAN ) 1 MG tablet Take 1 mg by mouth at bedtime.    [provider]  omeprazole (PRILOSEC) 20 MG capsule Take 20 mg by mouth 2 (two) times daily before a meal. 10/19/22   [provider]  predniSONE  (DELTASONE ) 5 MG tablet Take 1 tablet (5 mg total) by mouth daily with breakfast. 01/14/24   Meade Verdon RAMAN, MD  traZODone  (DESYREL ) 100 MG tablet Take 100 mg by mouth at bedtime.    [provider]  triamterene -hydrochlorothiazide  (MAXZIDE -25) 37.5-25 MG tablet Take 0.5 tablets by mouth daily. 02/06/19   [provider]  Allergies: Pneumococcal vaccines, Clarithromycin, Estradiol, Sulfa antibiotics, Doxycycline  hyclate, Pneumococcal polysaccharide vaccine, Sulfacetamide sodium-sulfur, and Sulfonamide derivatives    Review of Systems  Updated Vital Signs BP 104/60 (BP Location: Right Arm)   Pulse 83   Temp 98 F (36.7 C) (Oral)   Resp (!) 21   SpO2 93%   Physical Exam Vitals and nursing note reviewed.  Constitutional:      General: She is not in acute distress.    Appearance: She is well-developed.  HENT:     Head: Normocephalic and atraumatic.     Right Ear: External ear normal.     Left Ear: External ear normal.     Nose: Nose normal.  Eyes:     Extraocular  Movements: Extraocular movements intact.     Conjunctiva/sclera: Conjunctivae normal.     Pupils: Pupils are equal, round, and reactive to light.  Cardiovascular:     Rate and Rhythm: Normal rate and regular rhythm.     Heart sounds: No murmur heard. Pulmonary:     Effort: Pulmonary effort is normal. No respiratory distress.     Breath sounds: Wheezing (Faint expiratory) present.     Comments: On 4 L nasal cannula Abdominal:     General: Abdomen is flat. There is no distension.     Palpations: Abdomen is soft. There is no mass.     Tenderness: There is no abdominal tenderness. There is no guarding.  Musculoskeletal:     Cervical back: Normal range of motion and neck supple.     Right lower leg: No edema.     Left lower leg: No edema.     Comments: Tenderness to palpation of left side of chest wall.  No tenderness to palpation of left shoulder or elbow.  Tenderness palpation of left wrist and hand.  Skin:    General: Skin is warm and dry.  Neurological:     Mental Status: She is alert and oriented to person, place, and time. Mental status is at baseline.  Psychiatric:        Mood and Affect: Mood normal.     (all labs ordered are listed, but only abnormal results are displayed) Labs Reviewed  BASIC METABOLIC PANEL WITH GFR - Abnormal; Notable for the following components:      Result Value   Glucose, Bld 117 (*)    All other components within normal limits  CBC - Abnormal; Notable for the following components:   WBC 11.2 (*)    Hemoglobin 9.0 (*)    HCT 32.2 (*)    MCV 70.0 (*)    MCH 19.6 (*)    MCHC 28.0 (*)    RDW 19.0 (*)    All other components within normal limits    EKG: EKG Interpretation Date/Time:  Wednesday February 20 2024 10:57:51 EDT Ventricular Rate:  86 PR Interval:  156 QRS Duration:  74 QT Interval:  396 QTC Calculation: 473 R Axis:   64  Text Interpretation: Normal sinus rhythm Cannot rule out Anterior infarct , age undetermined Abnormal ECG  When compared with ECG of 17-Oct-2023 10:33, PREVIOUS ECG IS PRESENT Confirmed by Yolande Charleston [phone]) on 02/20/2024 11:23:30 AM  Radiology: [facility] Hand Complete Left Result Date: 02/20/2024 CLINICAL DATA:  Hand pain after a fall. EXAM: LEFT HAND - COMPLETE 3+ VIEW COMPARISON:  None Available. FINDINGS: No acute fracture or joint abnormality. First carpometacarpal joint osteoarthritis. IMPRESSION: 1. No acute findings. 2. First CMC joint osteoarthritis. Electronically Signed   By: Newell  Blietz M.D.   On: 02/20/2024 13:02   DG Wrist Complete Left Result Date: 02/20/2024 CLINICAL DATA:  Left hand pain.  Fall. EXAM: LEFT WRIST - COMPLETE 3+ VIEW COMPARISON:  None Available. FINDINGS: No acute osseous abnormality. First carpometacarpal joint osteoarthritis. IMPRESSION: 1. No acute findings. 2. First CMC joint osteoarthritis. Electronically Signed   By: Newell Eke M.D.   On: 02/20/2024 13:01   DG Ribs Unilateral W/Chest Left Result Date: 02/20/2024 CLINICAL DATA:  Left rib pain. Shortness of breath and hypoxia after a fall yesterday. EXAM: LEFT RIBS AND CHEST - 3+ VIEW COMPARISON:  05/28/2023 and CT chest 10/17/2023. FINDINGS: Patient is slightly rotated. Trachea is midline. Heart size normal. Thoracic aorta is somewhat uncoiled. Fiducial marker with nodular consolidation in the right perihilar region, better evaluated on CT chest 10/17/2023. Lungs are otherwise emphysematous but clear. Thin wire is seen in the left perihilar region, as on CT 10/17/2023, may be related to previous bronchoscopy. No pleural fluid. Dedicated views of the left ribs show no fracture. IMPRESSION: 1. No acute findings. 2. Right perihilar nodularity and fiducial marker, better evaluated on CT chest 10/17/2023. Electronically Signed   By: Newell Eke M.D.   On: 02/20/2024 13:01     Procedures   Medications Ordered in the ED  lidocaine  (LIDODERM ) 5 % 2 patch (2 patches Transdermal Patch Applied 02/20/24 1217)   ipratropium-albuterol  (DUONEB) 0.5-2.5 (3) MG/3ML nebulizer solution 6 mL (6 mLs Nebulization Given 02/20/24 1109)  methylPREDNISolone  sodium succinate (SOLU-MEDROL ) 125 mg/2 mL injection 125 mg (125 mg Intravenous Given 02/20/24 1220)  HYDROcodone -acetaminophen  (NORCO/VICODIN) 5-325 MG per tablet 1 tablet (1 tablet Oral Given 02/20/24 1215)                                    Medical Decision Making Amount and/or Complexity of Data Reviewed Labs: ordered. Radiology: ordered.  Risk Prescription drug management.   83 year old female with a history of DVT and PE on Eliquis  and COPD on 2 to 3 L home oxygen  who presents emergency department rib pain and wrist pain after a fall.    Initial Ddx:  Rib fracture, rib contusion, pneumothorax, pneumonia, COPD exacerbation, wrist fracture, scaphoid fracture  MDM/Course:  Patient presents to the emergency department with chest pain and wrist pain after a fall.  Reports that it was mechanical fall over her tubing.  In triage she was noted to have quite a bit of wheezing and had a slightly increased oxygen  requirement from her baseline of 3 L to 4 L when I saw her.  On exam does have some faint wheezing.  She is not in respiratory distress.  I do not appreciate any significant splinting with breathing.  Does have some bruising and tenderness to palpation of her wrist and hand.  No head trauma or neck pain.  Was given some pain medication and upon re-evaluation felt much improved.  Chest x-ray without any broken ribs or pneumonia or pneumothorax.  X-rays of her wrist and hand did not show any broken bones.  Is having some scaphoid tenderness to palpation so was given a thumb spica splint and instructed to follow-up with orthopedics.  Also given prednisone  for her wheezing because I suspect that she may have a COPD exacerbation at this time.  Also given oxycodone  and lidocaine  patches and incentive spirometer for her rib pain from her bruised rib.  This  patient presents to the ED for  concern of complaints listed in HPI, this involves an extensive number of treatment options, and is a complaint that carries with it a high risk of complications and morbidity. Disposition including potential need for admission considered.   Dispo: DC Home. Return precautions discussed including, but not limited to, those listed in the AVS. Allowed pt time to ask questions which were answered fully prior to dc.  Records reviewed Outpatient Clinic Notes The following labs were independently interpreted: Chemistry and show no acute abnormality I independently reviewed the following imaging with scope of interpretation limited to determining acute life threatening conditions related to emergency care: Chest x-ray and agree with the radiologist interpretation with the following exceptions: none I personally reviewed and interpreted cardiac monitoring: normal sinus rhythm  I personally reviewed and interpreted the pt's EKG: see above for interpretation  I have reviewed the patients home medications and made adjustments as needed Social Determinants of health:  Geriatric  Portions of this note were generated with Scientist, clinical (histocompatibility and immunogenetics). Dictation errors may occur despite best attempts at proofreading.    Final diagnoses:  Contusion of rib on left side, initial encounter  Tenderness of anatomical snuffbox  Injury of left wrist, initial encounter  COPD exacerbation (HCC)  Fall, initial encounter    ED Discharge Orders          Ordered    predniSONE  (DELTASONE ) 20 MG tablet  Daily        02/20/24 1322    oxyCODONE  (ROXICODONE ) 5 MG immediate release tablet  Every 4 hours PRN        02/20/24 1322    lidocaine  (LIDODERM ) 5 %  Every 24 hours        02/20/24 1322               Yolande Lamar BROCKS, MD 02/20/24 1623

## 2024-03-04 DIAGNOSIS — Z9181 History of falling: Secondary | ICD-10-CM | POA: Diagnosis not present

## 2024-03-04 DIAGNOSIS — K219 Gastro-esophageal reflux disease without esophagitis: Secondary | ICD-10-CM | POA: Diagnosis not present

## 2024-03-04 DIAGNOSIS — Z7901 Long term (current) use of anticoagulants: Secondary | ICD-10-CM | POA: Diagnosis not present

## 2024-03-04 DIAGNOSIS — D649 Anemia, unspecified: Secondary | ICD-10-CM | POA: Diagnosis not present

## 2024-03-04 DIAGNOSIS — R5383 Other fatigue: Secondary | ICD-10-CM | POA: Diagnosis not present

## 2024-03-04 DIAGNOSIS — R0789 Other chest pain: Secondary | ICD-10-CM | POA: Diagnosis not present

## 2024-03-05 DIAGNOSIS — D649 Anemia, unspecified: Secondary | ICD-10-CM | POA: Diagnosis not present

## 2024-03-06 DIAGNOSIS — J449 Chronic obstructive pulmonary disease, unspecified: Secondary | ICD-10-CM | POA: Diagnosis not present

## 2024-03-06 DIAGNOSIS — K219 Gastro-esophageal reflux disease without esophagitis: Secondary | ICD-10-CM | POA: Diagnosis not present

## 2024-03-06 DIAGNOSIS — Z86718 Personal history of other venous thrombosis and embolism: Secondary | ICD-10-CM | POA: Diagnosis not present

## 2024-03-06 DIAGNOSIS — R710 Precipitous drop in hematocrit: Secondary | ICD-10-CM | POA: Diagnosis not present

## 2024-03-06 DIAGNOSIS — R195 Other fecal abnormalities: Secondary | ICD-10-CM | POA: Diagnosis not present

## 2024-03-06 DIAGNOSIS — D649 Anemia, unspecified: Secondary | ICD-10-CM | POA: Diagnosis not present

## 2024-03-06 DIAGNOSIS — C3490 Malignant neoplasm of unspecified part of unspecified bronchus or lung: Secondary | ICD-10-CM | POA: Diagnosis not present

## 2024-03-06 DIAGNOSIS — I2699 Other pulmonary embolism without acute cor pulmonale: Secondary | ICD-10-CM | POA: Diagnosis not present

## 2024-03-06 DIAGNOSIS — Z9981 Dependence on supplemental oxygen: Secondary | ICD-10-CM | POA: Diagnosis not present

## 2024-03-06 DIAGNOSIS — Z7901 Long term (current) use of anticoagulants: Secondary | ICD-10-CM | POA: Diagnosis not present

## 2024-03-06 DIAGNOSIS — Z9049 Acquired absence of other specified parts of digestive tract: Secondary | ICD-10-CM | POA: Diagnosis not present

## 2024-03-28 DIAGNOSIS — D509 Iron deficiency anemia, unspecified: Secondary | ICD-10-CM | POA: Diagnosis not present

## 2024-03-30 ENCOUNTER — Emergency Department (HOSPITAL_COMMUNITY)

## 2024-03-30 ENCOUNTER — Emergency Department (HOSPITAL_COMMUNITY)
Admission: EM | Admit: 2024-03-30 | Discharge: 2024-03-30 | Disposition: A | Discharge status: Home / Self Care | Attending: Emergency Medicine | Admitting: Emergency Medicine

## 2024-03-30 ENCOUNTER — Other Ambulatory Visit: Payer: Self-pay

## 2024-03-30 ENCOUNTER — Encounter (HOSPITAL_COMMUNITY): Payer: Self-pay

## 2024-03-30 DIAGNOSIS — Z7901 Long term (current) use of anticoagulants: Secondary | ICD-10-CM | POA: Diagnosis not present

## 2024-03-30 DIAGNOSIS — Z7952 Long term (current) use of systemic steroids: Secondary | ICD-10-CM | POA: Insufficient documentation

## 2024-03-30 DIAGNOSIS — E876 Hypokalemia: Secondary | ICD-10-CM | POA: Diagnosis not present

## 2024-03-30 DIAGNOSIS — Z79899 Other long term (current) drug therapy: Secondary | ICD-10-CM | POA: Insufficient documentation

## 2024-03-30 DIAGNOSIS — I10 Essential (primary) hypertension: Secondary | ICD-10-CM | POA: Diagnosis not present

## 2024-03-30 DIAGNOSIS — R0602 Shortness of breath: Secondary | ICD-10-CM | POA: Diagnosis not present

## 2024-03-30 DIAGNOSIS — I77810 Thoracic aortic ectasia: Secondary | ICD-10-CM | POA: Diagnosis not present

## 2024-03-30 DIAGNOSIS — J441 Chronic obstructive pulmonary disease with (acute) exacerbation: Secondary | ICD-10-CM | POA: Insufficient documentation

## 2024-03-30 DIAGNOSIS — Z7951 Long term (current) use of inhaled steroids: Secondary | ICD-10-CM | POA: Insufficient documentation

## 2024-03-30 DIAGNOSIS — R059 Cough, unspecified: Secondary | ICD-10-CM | POA: Diagnosis present

## 2024-03-30 LAB — CBC WITH DIFFERENTIAL/PLATELET
Abs Immature Granulocytes: 0.04 K/uL (ref 0.00–0.07)
Basophils Absolute: 0.1 K/uL (ref 0.0–0.1)
Basophils Relative: 1 %
Eosinophils Absolute: 0.1 K/uL (ref 0.0–0.5)
Eosinophils Relative: 1 %
HCT: 40.4 % (ref 36.0–46.0)
Hemoglobin: 11 g/dL — ABNORMAL LOW (ref 12.0–15.0)
Immature Granulocytes: 1 %
Lymphocytes Relative: 11 %
Lymphs Abs: 1 K/uL (ref 0.7–4.0)
MCH: 22 pg — ABNORMAL LOW (ref 26.0–34.0)
MCHC: 27.2 g/dL — ABNORMAL LOW (ref 30.0–36.0)
MCV: 81 fL (ref 80.0–100.0)
Monocytes Absolute: 0.8 K/uL (ref 0.1–1.0)
Monocytes Relative: 9 %
Neutro Abs: 6.7 K/uL (ref 1.7–7.7)
Neutrophils Relative %: 77 %
Platelets: 357 K/uL (ref 150–400)
RBC: 4.99 MIL/uL (ref 3.87–5.11)
WBC: 8.6 K/uL (ref 4.0–10.5)
nRBC: 0 % (ref 0.0–0.2)

## 2024-03-30 LAB — MAGNESIUM: Magnesium: 3 mg/dL — ABNORMAL HIGH (ref 1.7–2.4)

## 2024-03-30 LAB — BASIC METABOLIC PANEL WITH GFR
Anion gap: 13 (ref 5–15)
BUN: 22 mg/dL (ref 8–23)
CO2: 29 mmol/L (ref 22–32)
Calcium: 8.6 mg/dL — ABNORMAL LOW (ref 8.9–10.3)
Chloride: 99 mmol/L (ref 98–111)
Creatinine, Ser: 0.88 mg/dL (ref 0.44–1.00)
GFR, Estimated: >60 mL/min (ref >60)
Glucose, Bld: 114 mg/dL — ABNORMAL HIGH (ref 70–99)
Potassium: 2.9 mmol/L — ABNORMAL LOW (ref 3.5–5.1)
Sodium: 141 mmol/L (ref 135–145)

## 2024-03-30 LAB — RESP PANEL BY RT-PCR (RSV, FLU A&B, COVID)  RVPGX2
Influenza A by PCR: NEGATIVE
Influenza B by PCR: NEGATIVE
Resp Syncytial Virus by PCR: NEGATIVE
SARS Coronavirus 2 by RT PCR: NEGATIVE

## 2024-03-30 LAB — TROPONIN I (HIGH SENSITIVITY)
Troponin I (High Sensitivity): 13 ng/L (ref <18)
Troponin I (High Sensitivity): 12 ng/L (ref <18)

## 2024-03-30 LAB — BRAIN NATRIURETIC PEPTIDE: B Natriuretic Peptide: 153.8 pg/mL — ABNORMAL HIGH (ref 0.0–100.0)

## 2024-03-30 MED ORDER — MAGNESIUM SULFATE 2 GM/50ML IV SOLN
2.0000 g | Freq: Once | INTRAVENOUS | Status: AC
Start: 2024-03-30 — End: 2024-03-30
  Administered 2024-03-30: 2 g via INTRAVENOUS
  Filled 2024-03-30: qty 50

## 2024-03-30 MED ORDER — METHYLPREDNISOLONE 4 MG PO TBPK: ORAL_TABLET | ORAL | 0 refills | Status: DC

## 2024-03-30 MED ORDER — IPRATROPIUM-ALBUTEROL 0.5-2.5 (3) MG/3ML IN SOLN
3.0000 mL | RESPIRATORY_TRACT | Status: AC
  Administered 2024-03-30 (×3): 3 mL via RESPIRATORY_TRACT
  Filled 2024-03-30: qty 3
  Filled 2024-03-30: qty 6

## 2024-03-30 MED ORDER — METHYLPREDNISOLONE SODIUM SUCC 125 MG IJ SOLR
125.0000 mg | Freq: Once | INTRAMUSCULAR | Status: AC
  Administered 2024-03-30: 125 mg via INTRAVENOUS
  Filled 2024-03-30: qty 2

## 2024-03-30 MED ORDER — POTASSIUM CHLORIDE CRYS ER 10 MEQ PO TBCR: 10.0000 meq | EXTENDED_RELEASE_TABLET | Freq: Two times a day (BID) | ORAL | 0 refills | Status: DC

## 2024-03-30 MED ORDER — POTASSIUM CHLORIDE CRYS ER 20 MEQ PO TBCR
60.0000 meq | EXTENDED_RELEASE_TABLET | Freq: Once | ORAL | Status: AC
Start: 2024-03-30 — End: 2024-03-30
  Administered 2024-03-30: 60 meq via ORAL
  Filled 2024-03-30: qty 3

## 2024-03-30 NOTE — ED Provider Notes (Signed)
 [place] EMERGENCY DEPARTMENT AT [facility] Provider Note   CSN: [id number] Arrival date & time: 03/30/24  9276     Patient presents with: Shortness of Breath   [name] is a 83 y.o. female.   Patient with history of anemia, COPD on 3L O2 via [place] at baseline, DVT/PE on Eliquis , hypertension presents today with complaints of shortness of breath. Reports that same began 2 days ago. Reports that she has been unable to get her oxygen  above 90 on several different home O2 monitors, and reports some readings at home as low as in the 60s on her home O2 requirement. Reports she has been utilizing her home COPD therapies without significant improvement. She is on 5 mg prednisone  every day. Denies fevers or chills. Reports a more productive cough, however this has been ongoing for the past several weeks. Denies any missed Eliquis  doses. No leg pain or leg swelling, recent travel, or recent surgeries. Does report some tightness in her chest, however only with deep breathing. She sleeps propped up on pillows every night and has not required any more pillows than normal. She no longer smokes. No known sick contacts.  The history is provided by the patient. No language interpreter was used.  Shortness of Breath Associated symptoms: wheezing        Prior to Admission medications   Medication Sig Start Date End Date Taking? Authorizing Provider  acetaminophen  (TYLENOL ) 500 MG tablet Take 500 mg by mouth every 6 (six) hours as needed for headache (pain).    [provider]  albuterol  (PROVENTIL ) (2.5 MG/3ML) 0.083% nebulizer solution Take 3 mLs (2.5 mg total) by nebulization every 6 (six) hours as needed for wheezing or shortness of breath. 10/11/23   Parrett, Madelin RAMAN, NP  albuterol  (VENTOLIN  HFA) 108 (90 Base) MCG/ACT inhaler Inhale 1-2 puffs into the lungs every 6 (six) hours as needed for wheezing or shortness of breath.    [provider]  apixaban  (ELIQUIS ) 5 MG TABS  tablet Take 1 tablet (5 mg total) by mouth 2 (two) times daily. 05/12/23   Brenna Adine CROME, DO  budesonide -glycopyrrolate -formoterol  (BREZTRI  AEROSPHERE) 160-9-4.8 MCG/ACT AERO inhaler Inhale 2 puffs into the lungs in the morning and at bedtime. 01/14/24   Desai, Nikita S, MD  cetirizine (ZYRTEC) 10 MG tablet Take 10 mg by mouth daily.    [provider]  escitalopram  (LEXAPRO ) 20 MG tablet Take 20 mg by mouth daily.    [provider]  furosemide  (LASIX ) 20 MG tablet Take 20 mg by mouth daily as needed for fluid or edema. Takes when having swelling.    [provider]  lidocaine  (LIDODERM ) 5 % Place 1 patch onto the skin daily. Remove & Discard patch within 12 hours or as directed by MD 02/20/24   Yolande Lamar JAYSON, MD  LORazepam  (ATIVAN ) 1 MG tablet Take 1 mg by mouth at bedtime.    [provider]  omeprazole (PRILOSEC) 20 MG capsule Take 20 mg by mouth 2 (two) times daily before a meal. 10/19/22   [provider]  oxyCODONE  (ROXICODONE ) 5 MG immediate release tablet Take 1 tablet (5 mg total) by mouth every 4 (four) hours as needed for severe pain (pain score 7-10). 02/20/24   Yolande Lamar JAYSON, MD  predniSONE  (DELTASONE ) 20 MG tablet Take 2 tablets (40 mg total) by mouth daily. 02/20/24   Yolande Lamar JAYSON, MD  predniSONE  (DELTASONE ) 5 MG tablet Take 1 tablet (5 mg total) by  mouth daily with breakfast. 01/14/24   Meade Verdon RAMAN, MD  traZODone  (DESYREL ) 100 MG tablet Take 100 mg by mouth at bedtime.    [provider]  triamterene -hydrochlorothiazide  (MAXZIDE -25) 37.5-25 MG tablet Take 0.5 tablets by mouth daily. 02/06/19   [provider]    Allergies: Pneumococcal vaccines, Clarithromycin, Estradiol, Sulfa antibiotics, Doxycycline  hyclate, Pneumococcal polysaccharide vaccine, Sulfacetamide sodium-sulfur, and Sulfonamide derivatives    Review of Systems  Respiratory:  Positive for shortness of breath and wheezing.   All other  systems reviewed and are negative.   Updated Vital Signs BP 116/65   Pulse 96   Temp 98.2 F (36.8 C)   Resp 18   Ht 5' 6 (1.676 m)   Wt 74.8 kg   SpO2 97%   BMI 26.63 kg/m   Physical Exam Vitals and nursing note reviewed.  Constitutional:      General: She is not in acute distress.    Appearance: Normal appearance. She is normal weight. She is not ill-appearing, toxic-appearing or diaphoretic.  HENT:     Head: Normocephalic and atraumatic.  Cardiovascular:     Rate and Rhythm: Normal rate and regular rhythm.     Heart sounds: Normal heart sounds.  Pulmonary:     Effort: Pulmonary effort is normal. No respiratory distress.     Breath sounds: Wheezing present.  Abdominal:     Tenderness: There is no abdominal tenderness.  Musculoskeletal:        General: Normal range of motion.     Cervical back: Normal range of motion.     Right lower leg: No tenderness. No edema.     Left lower leg: No tenderness. No edema.  Skin:    General: Skin is warm and dry.  Neurological:     General: No focal deficit present.     Mental Status: She is alert.  Psychiatric:        Mood and Affect: Mood normal.        Behavior: Behavior normal.     (all labs ordered are listed, but only abnormal results are displayed) Labs Reviewed  BASIC METABOLIC PANEL WITH GFR - Abnormal; Notable for the following components:      Result Value   Potassium 2.9 (*)    Glucose, Bld 114 (*)    Calcium  8.6 (*)    All other components within normal limits  CBC WITH DIFFERENTIAL/PLATELET - Abnormal; Notable for the following components:   Hemoglobin 11.0 (*)    MCH 22.0 (*)    MCHC 27.2 (*)    All other components within normal limits  BRAIN NATRIURETIC PEPTIDE - Abnormal; Notable for the following components:   B Natriuretic Peptide 153.8 (*)    All other components within normal limits  MAGNESIUM  - Abnormal; Notable for the following components:   Magnesium  3.0 (*)    All other components within  normal limits  RESP PANEL BY RT-PCR (RSV, FLU A&B, COVID)  [id number]  TROPONIN I (HIGH SENSITIVITY)  TROPONIN I (HIGH SENSITIVITY)    EKG: None  Radiology: DG Chest Portable 1 View Result Date: 03/30/2024 CLINICAL DATA:  Shortness of breath. EXAM: PORTABLE CHEST 1 VIEW COMPARISON:  None Available. FINDINGS: Heart size within normal limits. Ectasia of thoracic aorta noted. Pulmonary hyperinflation and peribronchial thickening, consistent with COPD. Scarring in the right midlung is stable. Surgical clips again noted in the right hilum. No evidence of acute infiltrate or pleural effusion. IMPRESSION: COPD and right lung scarring. No acute findings. Electronically  Signed   By: Norleen DELENA Kil M.D.   On: 03/30/2024 08:43     Procedures   Medications Ordered in the ED  ipratropium-albuterol  (DUONEB) 0.5-2.5 (3) MG/3ML nebulizer solution 3 mL (3 mLs Nebulization Given 03/30/24 0800)  magnesium  sulfate IVPB 2 g 50 mL (2 g Intravenous New Bag/Given 03/30/24 0759)  methylPREDNISolone  sodium succinate (SOLU-MEDROL ) 125 mg/2 mL injection 125 mg (125 mg Intravenous Given 03/30/24 0758)                                    Medical Decision Making Amount and/or Complexity of Data Reviewed Labs: ordered. Radiology: ordered.  Risk Prescription drug management.   This patient is a 83 y.o. female who presents to the ED for concern of shortness of breath, this involves an extensive number of treatment options, and is a complaint that carries with it a high risk of complications and morbidity. The emergent differential diagnosis prior to evaluation includes, but is not limited to,  CHF, pericardial effusion/tamponade, arrhythmias, ACS, COPD, asthma, bronchitis, pneumonia, pneumothorax, PE, anemia  This is not an exhaustive differential.   Past Medical History / Co-morbidities / Social History:  has a past medical history of Allergic rhinitis, Anemia due to GI blood loss, Anxiety, Asthma, Chronic airway  obstruction, not elsewhere classified, Depressive disorder, not elsewhere classified, DVT (deep venous thrombosis) (HCC), Dyspnea, Esophageal reflux, Essential hypertension (08/27/2019), History of radiation therapy, PE (pulmonary thromboembolism) (HCC) (01/29/2019), Pneumonia, and Wears partial dentures.  On 3L O2 via nasal cannula  Additional history: Chart reviewed.  Physical Exam: Physical exam performed. The pertinent findings include: overall well appearing, expiratory wheezing noted throughout, on 3L O2 via nasal cannula consistent with her baseline, speaking in complete sentences in no acute distress  Lab Tests: I ordered, and personally interpreted labs.  The pertinent results include:  hgb 11, improved from previous, K 2.9 (likely due to increased albuterol  treatments and hydrochlorothiazide ), troponin 13 --> 12, magnesium  3.0, BNP 153   Imaging Studies: I ordered imaging studies including CXR. I independently visualized and interpreted imaging which showed   COPD and right lung scarring. No acute findings   I agree with the radiologist interpretation.   Cardiac Monitoring:  The patient was maintained on a cardiac monitor.  My attending physician viewed and interpreted the cardiac monitored which showed an underlying rhythm of: sinus rhythm, no STEMI. I agree with this interpretation.   Medications: I ordered medication including duonebs x 3, magnesium , solu-medrol , oral potassium  for wheezing, shortness of breath, hypokalemia. Reevaluation of the patient after these medicines showed that the patient improved. I have reviewed the patients home medicines and have made adjustments as needed.   Disposition: After consideration of the diagnostic results and the patients response to treatment, I feel that emergency department workup does not suggest an emergent condition requiring admission or immediate intervention beyond what has been performed at this time. The plan is: Discharge  with close outpatient follow-up and return precautions.  I did offer admission for COPD exacerbation, however after above interventions, patient is feeling substantially improved and ready to go home.  Her lung sounds have significantly improved as well.  She continues to be on her baseline oxygen  status and is satting in the high 90s with this.  Will send for steroid taper for management of likely COPD exacerbation.  She has no signs or symptoms to suggest ACS/PE.  Reports compliance with her  Eliquis .  She is not clinically fluid overloaded.  I did also inform her that her potassium is little low, will replace this today, will send for a few days of additional replacement and emphasize importance of rechecking this outpatient. Evaluation and diagnostic testing in the emergency department does not suggest an emergent condition requiring admission or immediate intervention beyond what has been performed at this time.  Plan for discharge with close PCP follow-up.  Patient is understanding and amenable with plan, educated on red flag symptoms that would prompt immediate return.  Patient discharged in stable condition.  This is a shared visit with supervising physician Dr. Levander who has independently evaluated patient & provided guidance in evaluation/management/disposition, in agreement with care   Final diagnoses:  COPD exacerbation (HCC)  Hypokalemia    ED Discharge Orders          Ordered    methylPREDNISolone  (MEDROL  DOSEPAK) 4 MG TBPK tablet        03/30/24 1219    potassium chloride  (KLOR-CON  M) 10 MEQ tablet  2 times daily        03/30/24 1219          An After Visit Summary was printed and given to the patient.      Nora Lauraine DELENA DEVONNA 03/30/24 1220    Levander Houston, MD 04/01/24 1230

## 2024-03-30 NOTE — Discharge Instructions (Addendum)
 As we discussed, your workup in the ER today was overall reassuring for acute findings.  Laboratory evaluation and x-ray imaging did not reveal any emergent cause of your symptoms.  I do suspect that your symptoms are due to a flare of your COPD.  Given that you feel better with interventions today, no further evaluation is indicated.  I have given you a prescription for steroids that you need to fill and take as prescribed in its entirety for management of residual symptoms.  Your potassium was also a little low, we gave you replacement for this today.  I have also given you a few extra days of replacement for you to take as prescribed its entirety.  Please follow-up with your PCP to have this rechecked in the next week or so.  Return if development of any new or worsening symptoms.

## 2024-03-30 NOTE — ED Triage Notes (Signed)
 Pt reports worsening SOB over the weekend, pt has hx of COPD, usually on 3L [place]. Pt was 80% on her 3L upon arrival to the ED, family reports SPO2 a low as the 60s at home this weekend. Pt denies Chest pain. Pt did 1 breathing treatment this morning before coming in

## 2024-03-30 NOTE — ED Notes (Signed)
 Nt called [facility]@8 :05am

## 2024-03-30 NOTE — ED Notes (Signed)
 Pt ambulatory to bathroom

## 2024-04-03 DIAGNOSIS — D649 Anemia, unspecified: Secondary | ICD-10-CM | POA: Diagnosis not present

## 2024-04-04 ENCOUNTER — Ambulatory Visit (HOSPITAL_COMMUNITY)
Admission: RE | Admit: 2024-04-04 | Discharge: 2024-04-04 | Disposition: A | Discharge status: Home / Self Care | Source: Ambulatory Visit | Attending: Radiology | Admitting: Radiology

## 2024-04-04 DIAGNOSIS — I700 Atherosclerosis of aorta: Secondary | ICD-10-CM | POA: Diagnosis not present

## 2024-04-04 DIAGNOSIS — C3431 Malignant neoplasm of lower lobe, right bronchus or lung: Secondary | ICD-10-CM | POA: Insufficient documentation

## 2024-04-04 DIAGNOSIS — C3490 Malignant neoplasm of unspecified part of unspecified bronchus or lung: Secondary | ICD-10-CM | POA: Diagnosis not present

## 2024-04-04 DIAGNOSIS — J439 Emphysema, unspecified: Secondary | ICD-10-CM | POA: Diagnosis not present

## 2024-04-08 DIAGNOSIS — J441 Chronic obstructive pulmonary disease with (acute) exacerbation: Secondary | ICD-10-CM | POA: Diagnosis not present

## 2024-04-08 DIAGNOSIS — R0902 Hypoxemia: Secondary | ICD-10-CM | POA: Diagnosis not present

## 2024-04-08 DIAGNOSIS — E611 Iron deficiency: Secondary | ICD-10-CM | POA: Diagnosis not present

## 2024-04-08 DIAGNOSIS — Z9981 Dependence on supplemental oxygen: Secondary | ICD-10-CM | POA: Diagnosis not present

## 2024-04-08 DIAGNOSIS — E876 Hypokalemia: Secondary | ICD-10-CM | POA: Diagnosis not present

## 2024-04-08 NOTE — Progress Notes (Signed)
 Radiation Oncology         [phone] ________________________________  Name: [name] MRN: [id number]  Date: 04/10/2024  DOB: 05-29-1941  Follow-Up Visit Note  CC: Royden Ronal Czar, [name]  Brenna Adine CROME, DO  No diagnosis found.  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 She was last seen in office on 10/04/23 for a follow up visit.   In the interval since she was last seen, {he/she/they:23295::***} presented for a {fu/con:33167} with Dr. PIERRETTE on // during which                            Allergies:  is allergic to pneumococcal vaccines, clarithromycin, estradiol, sulfa antibiotics, doxycycline  hyclate, pneumococcal polysaccharide vaccine, sulfacetamide sodium-sulfur, and sulfonamide derivatives.  Meds: Current Outpatient Medications  Medication Sig Dispense Refill   acetaminophen  (TYLENOL ) 500 MG tablet Take 500 mg by mouth every 6 (six) hours as needed for headache (pain).     albuterol  (PROVENTIL ) (2.5 MG/3ML) 0.083% nebulizer solution Take 3 mLs (2.5 mg total) by nebulization every 6 (six) hours as needed for wheezing or shortness of breath. 120 mL 6   albuterol  (VENTOLIN  HFA) 108 (90 Base) MCG/ACT inhaler Inhale 1-2 puffs into the lungs every 6 (six) hours as needed for wheezing or shortness of breath.     apixaban  (ELIQUIS ) 5 MG TABS tablet Take 1  tablet (5 mg total) by mouth 2 (two) times daily. 28 tablet 0   budesonide -glycopyrrolate -formoterol  (BREZTRI  AEROSPHERE) 160-9-4.8 MCG/ACT AERO inhaler Inhale 2 puffs into the lungs in the morning and at bedtime. 1 each 5   cetirizine (ZYRTEC) 10 MG tablet Take 10 mg by mouth daily.     escitalopram  (LEXAPRO ) 20 MG tablet Take 20 mg by mouth daily.     furosemide  (LASIX ) 20 MG tablet Take 20 mg by mouth daily as needed for fluid or edema. Takes when having swelling.     lidocaine  (LIDODERM ) 5 % Place 1 patch onto the skin daily. Remove & Discard patch within 12 hours or as directed by MD 14 patch 0   LORazepam  (ATIVAN ) 1 MG tablet Take 1 mg by mouth at bedtime.     methylPREDNISolone  (MEDROL  DOSEPAK) 4 MG TBPK tablet Take as directed on package 1 each 0   omeprazole (PRILOSEC) 20 MG capsule Take 20 mg by mouth 2 (two) times daily before a meal.     oxyCODONE  (ROXICODONE ) 5 MG immediate release tablet Take 1 tablet (5 mg total) by mouth every 4 (four) hours as needed for severe pain (pain score 7-10). 12 tablet 0   potassium chloride  (KLOR-CON  M) 10 MEQ tablet Take 1 tablet (10 mEq total) by mouth 2 (two) times daily for 3 days. 6 tablet 0   predniSONE  (DELTASONE ) 20 MG tablet Take 2 tablets (40 mg total) by mouth daily. 5 tablet 0   predniSONE  (DELTASONE ) 5  MG tablet Take 1 tablet (5 mg total) by mouth daily with breakfast. 30 tablet 5   traZODone  (DESYREL ) 100 MG tablet Take 100 mg by mouth at bedtime.     triamterene -hydrochlorothiazide  (MAXZIDE -25) 37.5-25 MG tablet Take 0.5 tablets by mouth daily.     No current facility-administered medications for this visit.    Physical Findings: The patient is in no acute distress. Patient is alert and oriented.  vitals were not taken for this visit. .  No significant changes. Lungs are clear to auscultation bilaterally. Heart has regular rate and rhythm. No palpable cervical, supraclavicular, or axillary adenopathy. Abdomen soft, non-tender, normal  bowel sounds.   Lab Findings: Lab Results  Component Value Date   WBC 8.6 03/30/2024   HGB 11.0 (L) 03/30/2024   HCT 40.4 03/30/2024   MCV 81.0 03/30/2024   PLT 357 03/30/2024    Radiographic Findings: DG Chest Portable 1 View Result Date: 03/30/2024 CLINICAL DATA:  Shortness of breath. EXAM: PORTABLE CHEST 1 VIEW COMPARISON:  None Available. FINDINGS: Heart size within normal limits. Ectasia of thoracic aorta noted. Pulmonary hyperinflation and peribronchial thickening, consistent with COPD. Scarring in the right midlung is stable. Surgical clips again noted in the right hilum. No evidence of acute infiltrate or pleural effusion. IMPRESSION: COPD and right lung scarring. No acute findings. Electronically Signed   By: Norleen DELENA Kil M.D.   On: 03/30/2024 08:43    Impression:  {diagnosis}  The patient is recovering from the effects of radiation.  ***  Plan:  ***   *** minutes of total time was spent for this patient encounter, including preparation, face-to-face counseling with the patient and coordination of care, physical exam, and documentation of the encounter. ____________________________________  Lynwood CHARM Nasuti, PhD, MD  This document serves as a record of services personally performed by Lynwood Nasuti, MD. It was created on his behalf by Dorthy Fuse, a trained medical scribe. The creation of this record is based on the scribe's personal observations and the provider's statements to them. This document has been checked and approved by the attending provider.

## 2024-04-10 ENCOUNTER — Encounter: Payer: Self-pay | Admitting: Radiation Oncology

## 2024-04-10 ENCOUNTER — Ambulatory Visit
Admission: RE | Admit: 2024-04-10 | Discharge: 2024-04-10 | Disposition: A | Discharge status: Home / Self Care | Payer: Self-pay | Source: Ambulatory Visit | Attending: Radiation Oncology | Admitting: Radiation Oncology

## 2024-04-10 VITALS — BP 115/74 | HR 81 | Temp 97.7°F | Resp 24 | Ht 66.0 in | Wt 171.6 lb

## 2024-04-10 DIAGNOSIS — Z7952 Long term (current) use of systemic steroids: Secondary | ICD-10-CM | POA: Diagnosis not present

## 2024-04-10 DIAGNOSIS — Z7901 Long term (current) use of anticoagulants: Secondary | ICD-10-CM | POA: Insufficient documentation

## 2024-04-10 DIAGNOSIS — Z79899 Other long term (current) drug therapy: Secondary | ICD-10-CM | POA: Diagnosis not present

## 2024-04-10 DIAGNOSIS — E611 Iron deficiency: Secondary | ICD-10-CM | POA: Diagnosis not present

## 2024-04-10 DIAGNOSIS — I700 Atherosclerosis of aorta: Secondary | ICD-10-CM | POA: Insufficient documentation

## 2024-04-10 DIAGNOSIS — J449 Chronic obstructive pulmonary disease, unspecified: Secondary | ICD-10-CM | POA: Insufficient documentation

## 2024-04-10 DIAGNOSIS — C3431 Malignant neoplasm of lower lobe, right bronchus or lung: Secondary | ICD-10-CM | POA: Insufficient documentation

## 2024-04-10 DIAGNOSIS — J439 Emphysema, unspecified: Secondary | ICD-10-CM | POA: Diagnosis not present

## 2024-04-10 DIAGNOSIS — I77810 Thoracic aortic ectasia: Secondary | ICD-10-CM | POA: Diagnosis not present

## 2024-04-10 DIAGNOSIS — I2510 Atherosclerotic heart disease of native coronary artery without angina pectoris: Secondary | ICD-10-CM | POA: Insufficient documentation

## 2024-04-10 DIAGNOSIS — Z923 Personal history of irradiation: Secondary | ICD-10-CM | POA: Insufficient documentation

## 2024-04-10 NOTE — Progress Notes (Signed)
 [name] is here today for follow up post radiation to the lung.  Lung Side: Right, patient completed treatment on 05/21/23.  Does the patient complain of any of the following: Pain:No Shortness of breath w/wo exertion: yes Cough: Yes- productive Hemoptysis: No Pain with swallowing: No Swallowing/choking concerns: No Appetite: Fair  Energy Level: Low, patient currently having iron infusions.  Post radiation skin Changes: NO    Additional comments if applicable:   BP 115/74 (BP Location: Left Wrist, Patient Position: Sitting, Cuff Size: Normal)   Pulse 81   Temp 97.7 F (36.5 C)   Resp (!) 24   Ht 5' 6 (1.676 m)   Wt 171 lb 9.6 oz (77.8 kg)   SpO2 90%   PF (!) 2 L/min   BMI 27.70 kg/m

## 2024-04-14 IMAGING — CR DG CHEST 2V
2 series · 2 of 2 positions shown · non-contrast
Comparison: 05/26/2021

CLINICAL DATA: Short of breath

EXAM:
CHEST - 2 VIEW

[chest pa]
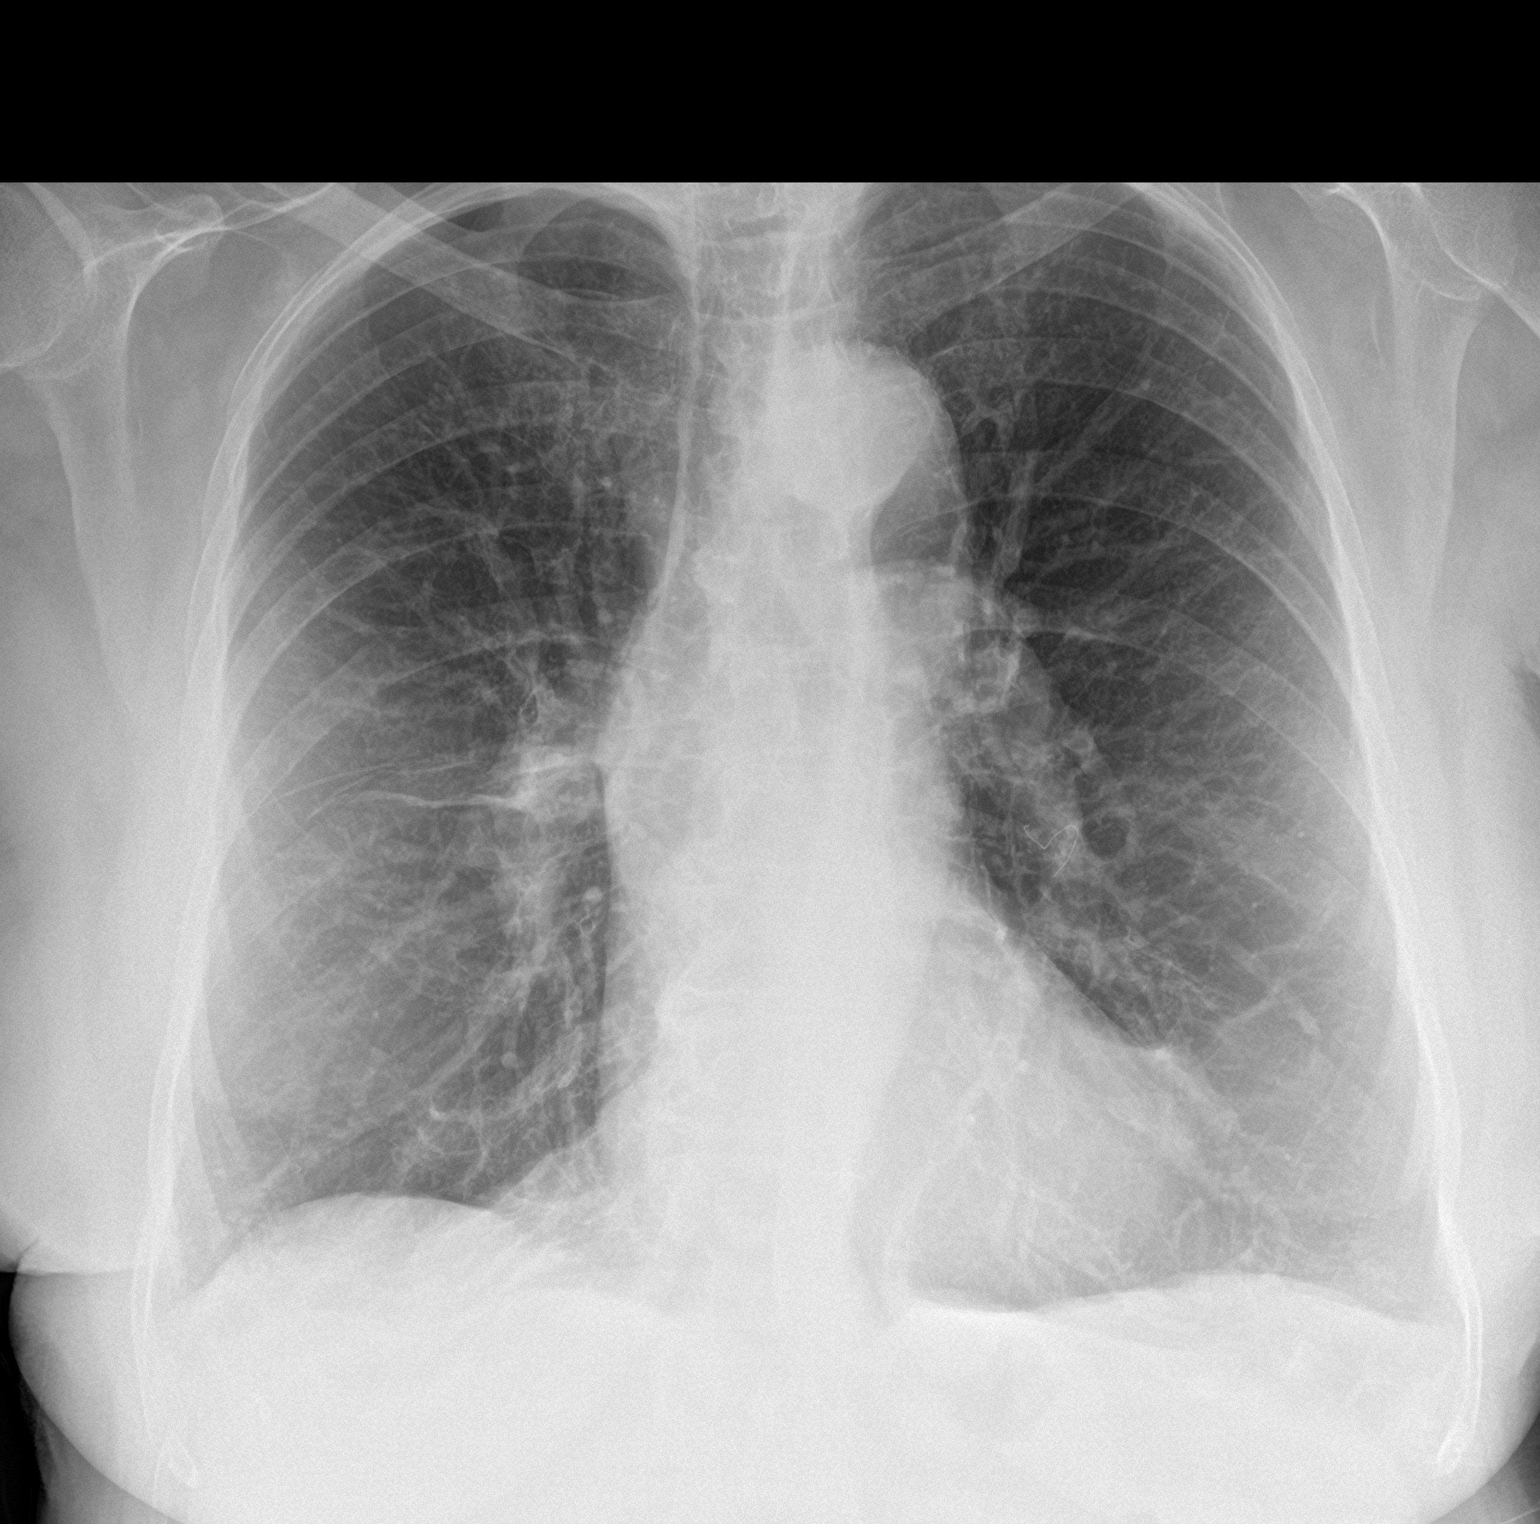

[chest lat]
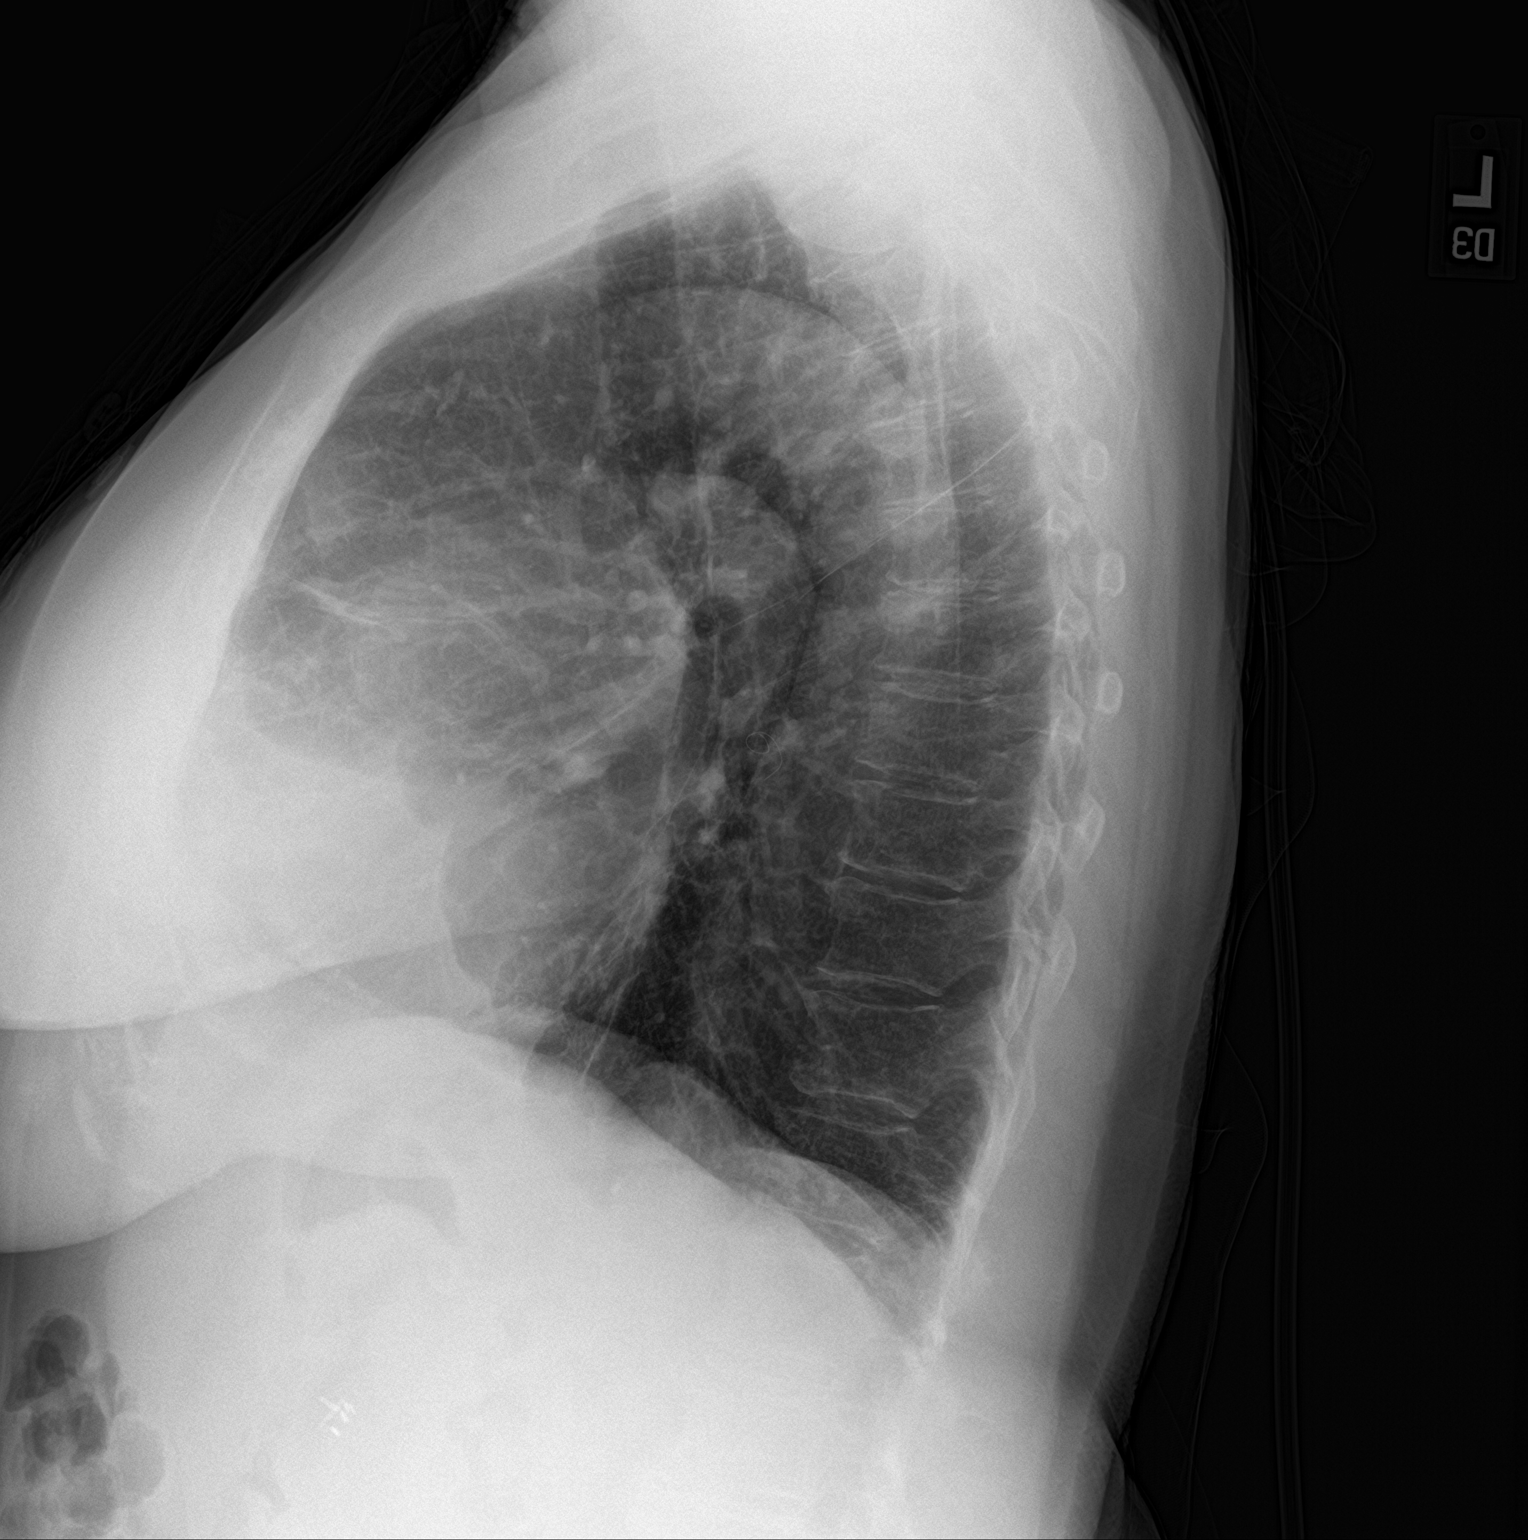

[2 of 2 positions shown; findings below may reference images not displayed]

FINDINGS: The heart size is normal. Negative for heart failure. Approximately
5 cm fragment of wire in the left lower lobe pulmonary artery is
unchanged from prior studies.

Negative for acute infiltrate or effusion. Mild scarring in the
right upper lobe anteriorly.
IMPRESSION: No active cardiopulmonary disease.

## 2024-04-15 ENCOUNTER — Encounter: Payer: Self-pay | Admitting: Adult Health

## 2024-04-15 ENCOUNTER — Ambulatory Visit: Admitting: Adult Health

## 2024-04-15 VITALS — BP 132/84 | HR 78 | Temp 97.7°F | Ht 66.0 in | Wt 173.6 lb

## 2024-04-15 DIAGNOSIS — J441 Chronic obstructive pulmonary disease with (acute) exacerbation: Secondary | ICD-10-CM | POA: Diagnosis not present

## 2024-04-15 DIAGNOSIS — C3431 Malignant neoplasm of lower lobe, right bronchus or lung: Secondary | ICD-10-CM

## 2024-04-15 DIAGNOSIS — J9611 Chronic respiratory failure with hypoxia: Secondary | ICD-10-CM

## 2024-04-15 DIAGNOSIS — I2699 Other pulmonary embolism without acute cor pulmonale: Secondary | ICD-10-CM | POA: Diagnosis not present

## 2024-04-15 IMAGING — CT CT ANGIO CHEST
2 of 6 series · 17 of 36 positions shown · IV contrast (agent unspecified)
Comparison: 01/14/2021
COMPARISON: 01/14/2021

Addendum:
CLINICAL DATA: High probability for pulmonary embolism.

EXAM:
CT ANGIOGRAPHY CHEST WITH CONTRAST
TECHNIQUE: Multidetector CT imaging of the chest was performed using the
standard protocol during bolus administration of intravenous
contrast. Multiplanar CT image reconstructions and MIPs were
obtained to evaluate the vascular anatomy.

[Series 7: pe thins · axial · 0.74mm/px · z∈[+1067,+1342]mm · 16 of 311 slices shown]
[im 18/311  lung]
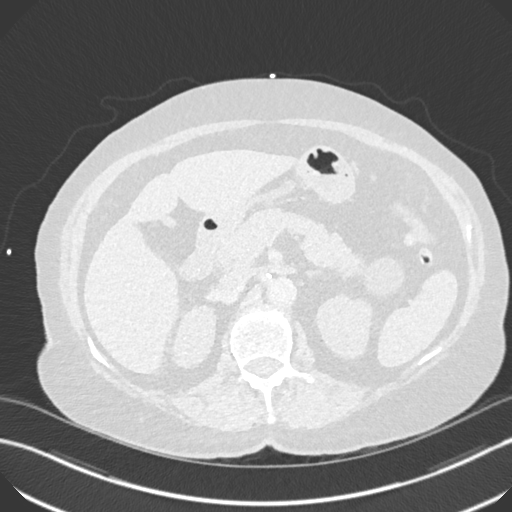
[im 35/311  mediastinal]
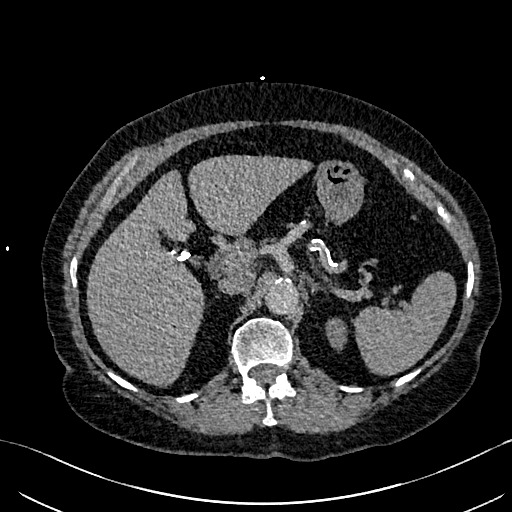
[im 52/311  lung]
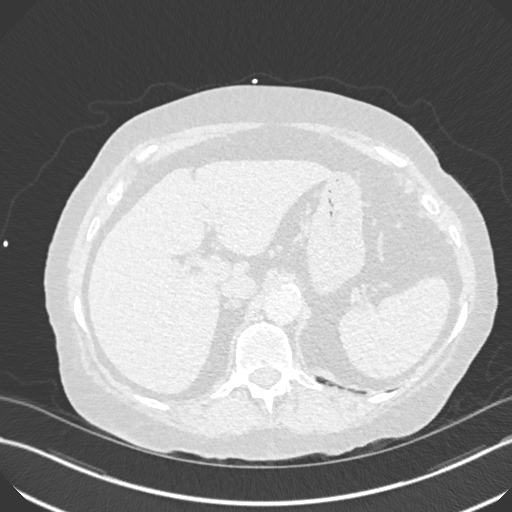
[im 69/311  mediastinal]
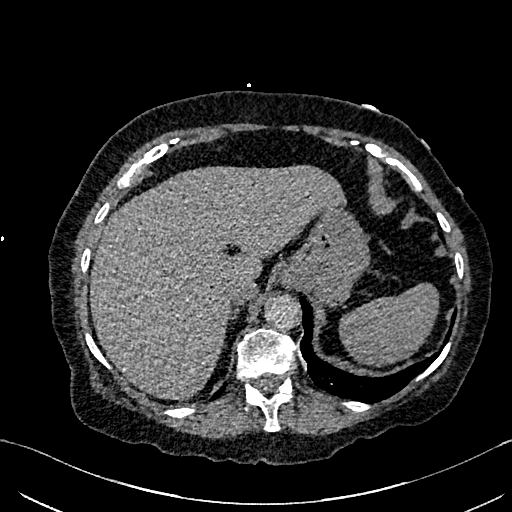
[im 87/311  lung]
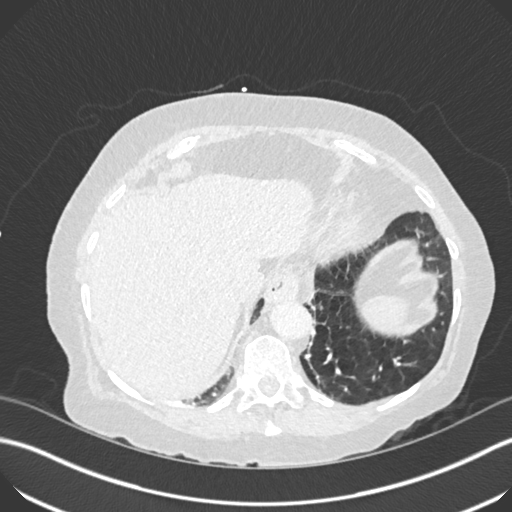
[im 104/311  mediastinal]
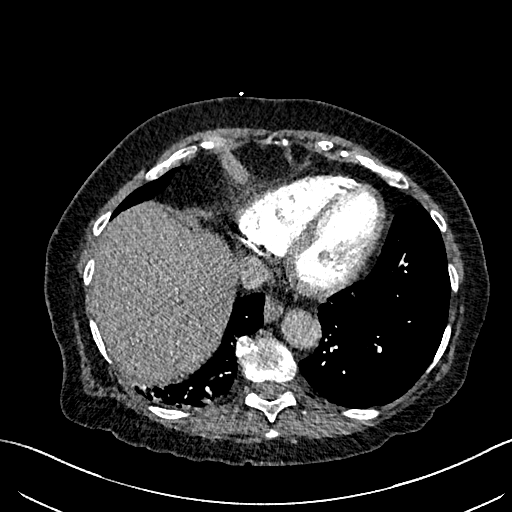
[im 121/311  lung]
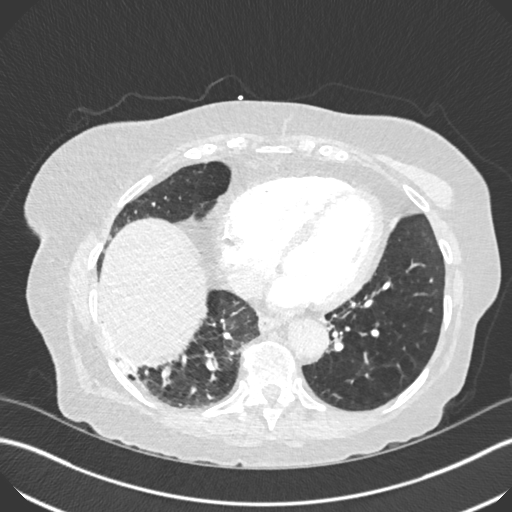
[im 138/311  mediastinal]
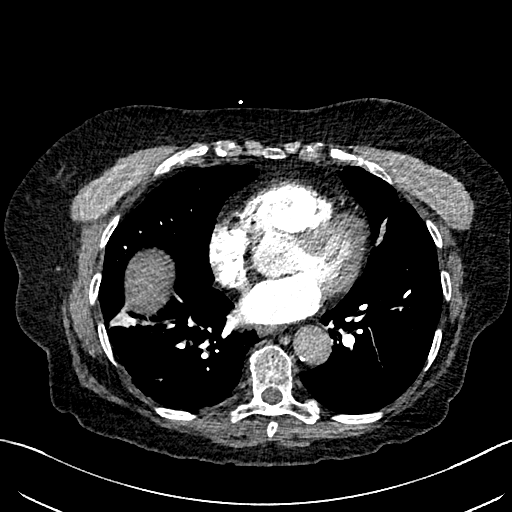
[im 173/311  lung]
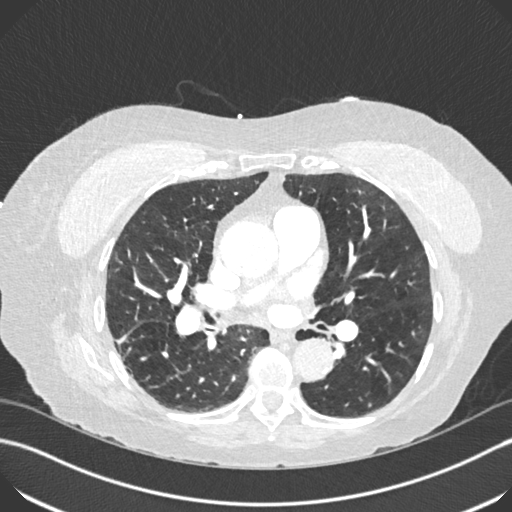
[im 190/311  mediastinal]
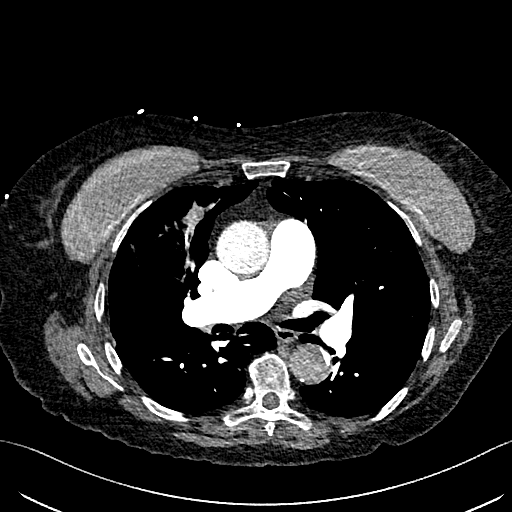
[im 207/311  lung]
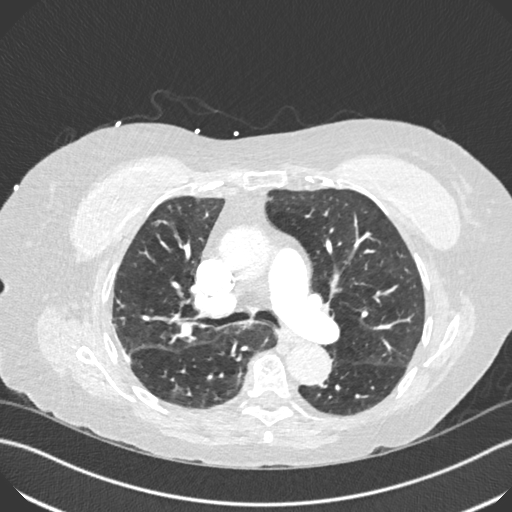
[im 224/311  mediastinal]
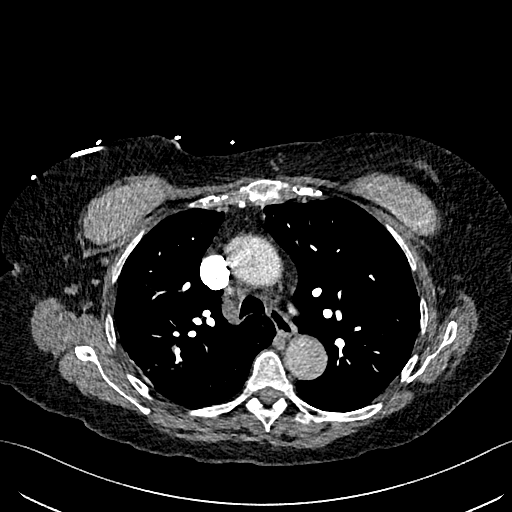
[im 242/311  lung]
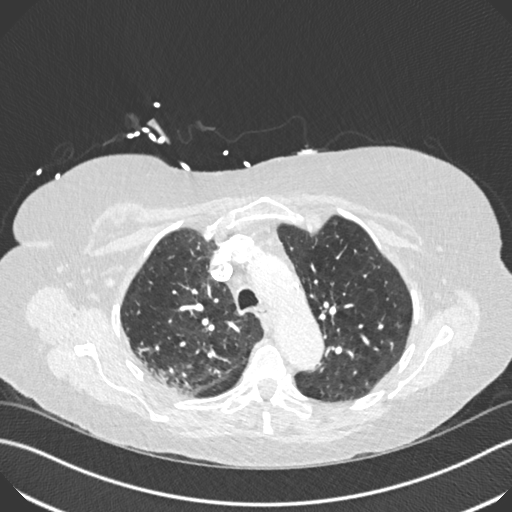
[im 259/311  mediastinal]
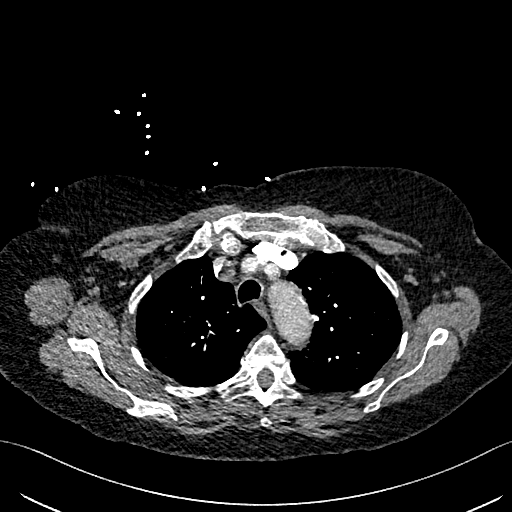
[im 276/311  lung]
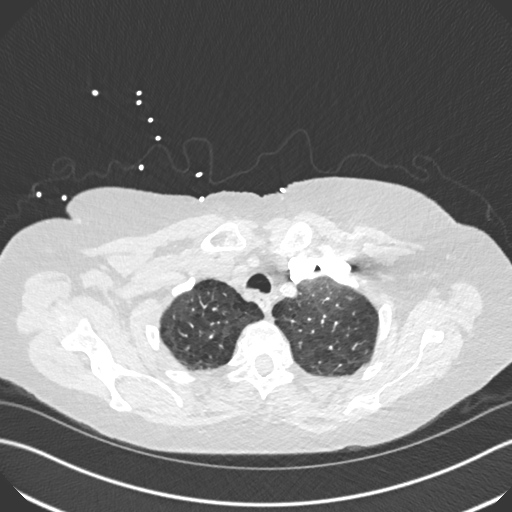
[im 293/311  mediastinal]
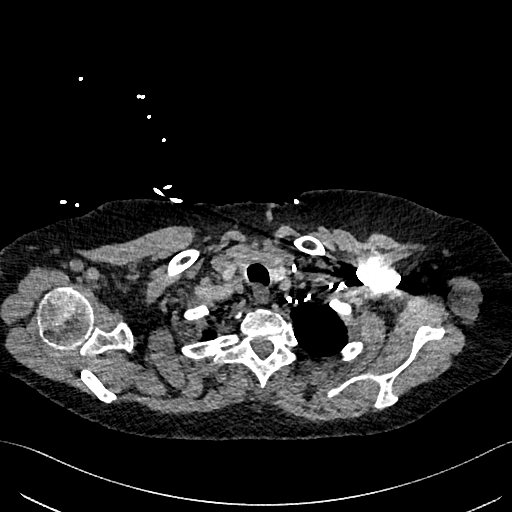

[Series 8: pe 2mm cor · coronal · 0.61mm/px · 1 of 139 slices shown]
[im 70/139  mediastinal]
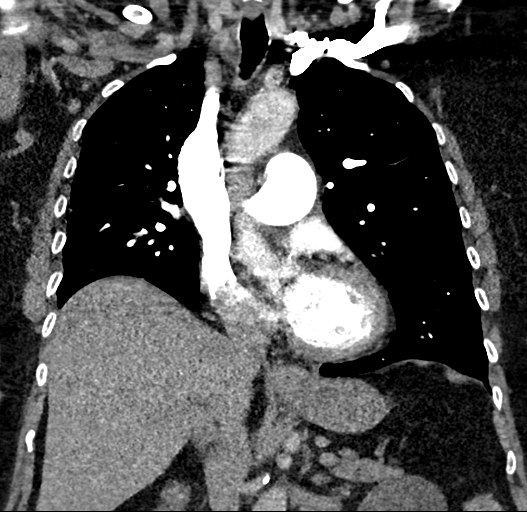

[17 of 36 positions shown; findings below may reference images not displayed]

RADIATION DOSE REDUCTION: This exam was performed according to the
departmental dose-optimization program which includes automated
exposure control, adjustment of the mA and/or kV according to
patient size and/or use of iterative reconstruction technique.

CONTRAST:  66mL OMNIPAQUE IOHEXOL 350 MG/ML SOLN
FINDINGS: Cardiovascular: The heart size is normal. No substantial pericardial
effusion. Coronary artery calcification is evident. Mild
atherosclerotic calcification is noted in the wall of the thoracic
aorta. Ascending thoracic aorta measures 4 cm diameter. There is no
filling defect within the opacified pulmonary arteries to suggest
the presence of an acute pulmonary embolus.

Mediastinum/Nodes: No mediastinal lymphadenopathy. There is no hilar
lymphadenopathy. Tiny hiatal hernia. The esophagus has normal
imaging features. There is no axillary lymphadenopathy.

Lungs/Pleura: Centrilobular emphsyema noted. Subsegmental
atelectasis noted right middle lobe and lingula. No suspicious
pulmonary nodule or mass. No focal airspace consolidation. No
pleural effusion.

Upper Abdomen: 5.5 cm water density lesion interpolar left kidney
incompletely visualized but compatible with a cyst and stable since
abdomen CT 11/22/2019.

Musculoskeletal: No worrisome lytic or sclerotic osseous
abnormality.

Review of the MIP images confirms the above findings.
IMPRESSION: 1. No CT evidence for acute pulmonary embolus.
2. Tiny hiatal hernia.
3. Aortic Atherosclerosis (H3LZ4-2DW.W) and Emphysema (H3LZ4-NCP.W).

ADDENDUM:
As noted in the body of the report, the diameter of the ascending
thoracic aorta is 4.0 cm. Recommend annual imaging followup by CTA
or MRA. This recommendation follows 6989
ACCF/AHA/AATS/ACR/ASA/SCA/OA/WERNER/PLOTT/HUNG Guidelines for the
Diagnosis and Management of Patients with Thoracic Aortic Disease.
Circulation. 6989; 121: E266-e369. Aortic aneurysm NOS (H3LZ4-NXQ.Y)

*** End of Addendum ***
RADIATION DOSE REDUCTION: This exam was performed according to the
departmental dose-optimization program which includes automated
exposure control, adjustment of the mA and/or kV according to
patient size and/or use of iterative reconstruction technique.

CONTRAST:  66mL OMNIPAQUE IOHEXOL 350 MG/ML SOLN
FINDINGS: Cardiovascular: The heart size is normal. No substantial pericardial
effusion. Coronary artery calcification is evident. Mild
atherosclerotic calcification is noted in the wall of the thoracic
aorta. Ascending thoracic aorta measures 4 cm diameter. There is no
filling defect within the opacified pulmonary arteries to suggest
the presence of an acute pulmonary embolus.

Mediastinum/Nodes: No mediastinal lymphadenopathy. There is no hilar
lymphadenopathy. Tiny hiatal hernia. The esophagus has normal
imaging features. There is no axillary lymphadenopathy.

Lungs/Pleura: Centrilobular emphsyema noted. Subsegmental
atelectasis noted right middle lobe and lingula. No suspicious
pulmonary nodule or mass. No focal airspace consolidation. No
pleural effusion.

Upper Abdomen: 5.5 cm water density lesion interpolar left kidney
incompletely visualized but compatible with a cyst and stable since
abdomen CT 11/22/2019.

Musculoskeletal: No worrisome lytic or sclerotic osseous
abnormality.

Review of the MIP images confirms the above findings.
IMPRESSION: 1. No CT evidence for acute pulmonary embolus.
2. Tiny hiatal hernia.
3. Aortic Atherosclerosis (H3LZ4-2DW.W) and Emphysema (H3LZ4-NCP.W).

## 2024-04-15 MED ORDER — BREZTRI AEROSPHERE 160-9-4.8 MCG/ACT IN AERO: 2.0000 | INHALATION_SPRAY | Freq: Two times a day (BID) | RESPIRATORY_TRACT | Status: AC

## 2024-04-15 MED ORDER — PREDNISONE 10 MG PO TABS: ORAL_TABLET | ORAL | 0 refills | Status: DC

## 2024-04-15 NOTE — Patient Instructions (Addendum)
 Prednisone  20mg  daily for 5 days , take with food.  Continue Breztri  2 puffs Twice daily  , rinse after use  Albuterol  inhaler or neb As needed  Continue on Zyrtec 10mg  daily.  Continue on Omeprazole daily. Continue on Eliquis  Twice daily   Continue on Oxygen  2l/m, increase 3l/m with activity.  Activity as tolerated.  CT chest as planned per Radiation/Oncology Follow up in 4 months and As needed
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
## 2024-04-17 DIAGNOSIS — K219 Gastro-esophageal reflux disease without esophagitis: Secondary | ICD-10-CM | POA: Diagnosis not present

## 2024-04-17 DIAGNOSIS — J449 Chronic obstructive pulmonary disease, unspecified: Secondary | ICD-10-CM | POA: Diagnosis not present

## 2024-04-17 DIAGNOSIS — K5520 Angiodysplasia of colon without hemorrhage: Secondary | ICD-10-CM | POA: Diagnosis not present

## 2024-04-17 DIAGNOSIS — D500 Iron deficiency anemia secondary to blood loss (chronic): Secondary | ICD-10-CM | POA: Diagnosis not present

## 2024-04-21 ENCOUNTER — Telehealth: Payer: Self-pay | Admitting: *Deleted

## 2024-04-21 NOTE — Telephone Encounter (Signed)
 Called patient to inform of Ct for 01-08-25- arrival time- 12:15 pm @ [facility], no restrictions to scan, patient to receive results from Dr. Shannon on 01-12-25 @ 11:15 am via telephone, spoke with patient and she is aware of these appts. and the instructions

## 2024-04-23 ENCOUNTER — Ambulatory Visit: Attending: Cardiology | Admitting: Cardiology

## 2024-04-23 ENCOUNTER — Encounter: Payer: Self-pay | Admitting: Cardiology

## 2024-04-23 ENCOUNTER — Other Ambulatory Visit (HOSPITAL_COMMUNITY): Payer: Self-pay

## 2024-04-23 VITALS — BP 138/88 | HR 87 | Resp 16 | Ht 66.0 in | Wt 168.0 lb

## 2024-04-23 DIAGNOSIS — J432 Centrilobular emphysema: Secondary | ICD-10-CM | POA: Diagnosis not present

## 2024-04-23 DIAGNOSIS — E876 Hypokalemia: Secondary | ICD-10-CM | POA: Diagnosis not present

## 2024-04-23 DIAGNOSIS — I2510 Atherosclerotic heart disease of native coronary artery without angina pectoris: Secondary | ICD-10-CM

## 2024-04-23 DIAGNOSIS — I871 Compression of vein: Secondary | ICD-10-CM

## 2024-04-23 MED ORDER — ATORVASTATIN CALCIUM 10 MG PO TABS
10.0000 mg | ORAL_TABLET | Freq: Every day | ORAL | 0 refills | Status: AC
  Filled 2024-04-23: qty 90, 90d supply, fill #0

## 2024-04-23 NOTE — Progress Notes (Signed)
 Cardiology Office Note:  .   Date:  04/25/2024  ID:  [name], DOB 1941/05/14, MRN [id number] PCP: Royden Ronal Czar, [name]  [facility] Providers Cardiologist:  Gordy Bergamo, MD   History of Present Illness: .   [name] is a 83 y.o. Caucasian female with adenocarcinoma of the right lower lung s/p SBRT completed on 05/21/2023, no evidence of recurrence by recent CT, referred to me for evaluation of coronary artery disease due to coronary calcification noted on CT scan on 04/04/2024.  Patient has chronic dyspnea related to radiation injury and pneumonitis and fibrosis and severe emphysema.   Echocardiogram on 11/10/2022 had revealed normal LVEF with grade 1 diastolic dysfunction with no evidence of pulmonary hypertension and mild aortic root dilatation at 41 mm.  Patient has had multiple DVTs in the past and also has May-Thurner syndrome SP left iliac vein stenting.    Discussed the use of AI scribe software for clinical note transcription with the patient, who gave verbal consent to proceed.  History of Present Illness [name] is an 83 year old female with coronary artery disease who presents for evaluation of coronary calcium  buildup. She was referred by Czar Due, a PA, for evaluation of coronary calcium  buildup noted on a CT scan.  She has coronary artery disease and is not on cholesterol medication. She denies chest pain but has shortness of breath. A recent CT scan showed coronary calcium  buildup and plaque in the coronary arteries.  She had non-small cell lung adenocarcinoma treated with radiation therapy in 2024. Follow-up CT scans show no recurrence. She developed pulmonary fibrosis and severe emphysema and now has shortness of breath and requires continuous home oxygen .  She is physically inactive due to breathing difficulty and needs 24-hour oxygen . She has dyspnea with minimal exertion such as walking in the house.  Her past medical history includes COPD,  pulmonary fibrosis after radiation, and prior pulmonary embolism. She is on Eliquis  for anticoagulation and has two iliac vein stents for May-Thurner syndrome.  She currently uses Albuterol  and Lasix  as needed. She receives iron infusions for anemia with hemoglobin between 9 and 11, with evaluation ongoing for the cause.  She smoked in the past but has quit. She does not use tobacco now.   Cardiac Studies relevent.    CT Chest 04/04/2024: 1. Interval development of dense, bandlike consolidation adjacent to a nodule in the peripheral superior segment right lower lobe measuring 1.2 x 0.8 cm, consistent with expected radiation pneumonitis and developing fibrosis. 2. No evidence of lymphadenopathy or metastatic disease in the chest. 3. Severe emphysema. 4. Coronary artery disease. Aortic atherosclerosis. Normal heart size. Left and right coronary artery calcifications. No pericardial effusion.  Labs    No results found for: LIPOA  Recent Labs    10/17/23 1119 02/20/24 1121 03/30/24 0755  NA 141 140 141  K 3.8 3.8 2.9*  CL 101 102 99  CO2 30 26 29   GLUCOSE 116* 117* 114*  BUN 23 23 22   CREATININE 0.84 0.78 0.88  CALCIUM  9.4 9.3 8.6*  GFRNONAA >60 >60 >60    Lab Results  Component Value Date   ALT 14 10/17/2023   AST 24 10/17/2023   ALKPHOS 64 10/17/2023   BILITOT 0.6 10/17/2023      Latest Ref Rng & Units 03/30/2024    7:55 AM 02/20/2024   11:21 AM 10/17/2023   11:19 AM  CBC  WBC 4.0 - 10.5 K/uL 8.6  11.2  7.8   Hemoglobin 12.0 - 15.0 g/dL 88.9  9.0  89.4   Hematocrit 36.0 - 46.0 % 40.4  32.2  36.0   Platelets 150 - 400 K/uL 357  334  348    Lab Results  Component Value Date   HGBA1C 4.9 08/27/2019    Lab Results  Component Value Date   TSH 0.334 (L) 05/28/2013    Care everywhere/Faxed External Labs:  Lipid profile 09/11/2023: TC 244, TG 144, HDL 92 LDL 127  ROS  Review of Systems  Cardiovascular:  Negative for chest pain, dyspnea on exertion and leg  swelling.   Physical Exam:   VS:  BP 138/88 (BP Location: Left Wrist, Patient Position: Sitting, Cuff Size: Small)   Pulse 87   Resp 16   Ht 5' 6 (1.676 m)   Wt 168 lb (76.2 kg)   SpO2 93%   BMI 27.12 kg/m    Wt Readings from Last 3 Encounters:  04/23/24 168 lb (76.2 kg)  04/15/24 173 lb 9.6 oz (78.7 kg)  04/10/24 171 lb 9.6 oz (77.8 kg)    BP Readings from Last 3 Encounters:  04/23/24 138/88  04/15/24 132/84  04/10/24 115/74   Physical Exam Neck:     Vascular: No carotid bruit or JVD.  Cardiovascular:     Rate and Rhythm: Normal rate and regular rhythm.     Pulses: Intact distal pulses.     Heart sounds: Normal heart sounds. No murmur heard.    No gallop.  Pulmonary:     Effort: Pulmonary effort is normal.     Breath sounds: Normal breath sounds.  Abdominal:     General: Bowel sounds are normal.     Palpations: Abdomen is soft.  Musculoskeletal:     Right lower leg: No edema.     Left lower leg: No edema.    EKG:    EKG Interpretation Date/Time:  Wednesday April 23 2024 11:13:15 EST Ventricular Rate:  85 PR Interval:  152 QRS Duration:  68 QT Interval:  380 QTC Calculation: 452 R Axis:   38  Text Interpretation: EKG 04/23/2024: Normal sinus rhythm at rate of 85 bpm, poor R progression, probably normal variant.  No evidence of ischemia.  Compared to 03/30/2024, frequent PACs not present. Confirmed by Chyane Greer, Jagadeesh ([id number]) on 04/23/2024 11:52:04 AM    ASSESSMENT AND PLAN: .      ICD-10-CM   1. Coronary artery calcification seen on CAT scan  I25.10 EKG 12-Lead    atorvastatin  (LIPITOR) 10 MG tablet    2. Centrilobular emphysema (HCC)  J43.2     3. May-Thurner syndrome  I87.1      Assessment & Plan Atherosclerotic heart disease of native coronary artery without angina/coronary calcification noted on CT scan Coronary artery disease with plaque buildup and calcifications on CT scan. No significant angina or chest pain. EKGs are normal, and heart  function is good. Shortness of breath is likely related to lung disease rather than heart disease. Cholesterol levels are not significantly elevated, but plaque buildup warrants preventive treatment. - Prescribed atorvastatin  10 mg daily to prevent further plaque buildup and protect against heart attack or stroke. - Instructed to monitor for side effects such as muscle aches or joint pains and report if she occurs. - Follow up with primary care physician for cholesterol management and blood work in 2-3 months. - She has no active angina, needs primary prevention, if dyspnea is felt out of proportion to her underlying COPD, then  consider stress testing and further cardiac evaluation.  However would recommend primary prevention.  She has no traditional risk factors including hypertension, diabetes mellitus or smoking.  Pulmonary fibrosis and centrilobular emphysema due to radiation therapy Progression of lung scarring and emphysema due to radiation therapy. CT scan shows severe emphysema and scarring, consistent with radiation pneumonitis and developing fibrosis. Shortness of breath is likely due to lung disease rather than heart disease. - Continue supplemental oxygen  therapy 24 hours a day. - Sent MyChart message to pulmonologist regarding CT scan findings and progression of lung scarring.  Chronic respiratory failure with hypoxia requiring supplemental oxygen  Chronic respiratory failure with hypoxia, requiring continuous supplemental oxygen . Oxygen  levels must be maintained to prevent complications. - Ensure oxygen  levels are maintained and do not drop.   Follow up: PRN Signed,  Gordy Bergamo, MD, [facility] 04/25/2024, 10:10 AM [facility] [address] [place], [place] [address] Phone: [phone]. Fax:  [phone]

## 2024-04-23 NOTE — Patient Instructions (Signed)
 Medication Instructions:  Start Atorvastatin  10 mg by mouth daily  *If you need a refill on your cardiac medications before your next appointment, please call your pharmacy*  Lab Work: none If you have labs (blood work) drawn today and your tests are completely normal, you will receive your results only by: MyChart Message (if you have MyChart) OR A paper copy in the mail If you have any lab test that is abnormal or we need to change your treatment, we will call you to review the results.  Testing/Procedures: none  Follow-Up: At [facility], you and your health needs are our priority.  As part of our continuing mission to provide you with exceptional heart care, our providers are all part of one team.  This team includes your primary Cardiologist (physician) and Advanced Practice Providers or APPs (Physician Assistants and Nurse Practitioners) who all work together to provide you with the care you need, when you need it.  Your next appointment:   As needed  Provider:   Gordy Bergamo, MD    We recommend signing up for the patient portal called MyChart.  Sign up information is provided on this After Visit Summary.  MyChart is used to connect with patients for Virtual Visits (Telemedicine).  Patients are able to view lab/test results, encounter notes, upcoming appointments, etc.  Non-urgent messages can be sent to your provider as well.   To learn more about what you can do with MyChart, go to [email or web address].   Other Instructions

## 2024-08-14 ENCOUNTER — Ambulatory Visit: Admitting: Adult Health

## 2025-01-08 ENCOUNTER — Ambulatory Visit (HOSPITAL_COMMUNITY)

## 2025-01-12 ENCOUNTER — Ambulatory Visit: Admitting: Radiation Oncology
